# Patient Record
Sex: Male | Born: 1960
Health system: Southern US, Community
[De-identification: ages and names within clinical notes are randomized; demographics above are authoritative.]

## PROBLEM LIST (undated history)

## (undated) DIAGNOSIS — I4891 Unspecified atrial fibrillation: Secondary | ICD-10-CM

## (undated) DIAGNOSIS — I340 Nonrheumatic mitral (valve) insufficiency: Secondary | ICD-10-CM

## (undated) DIAGNOSIS — I471 Supraventricular tachycardia, unspecified: Secondary | ICD-10-CM

## (undated) DIAGNOSIS — Z8669 Personal history of other diseases of the nervous system and sense organs: Secondary | ICD-10-CM

## (undated) DIAGNOSIS — I480 Paroxysmal atrial fibrillation: Secondary | ICD-10-CM

## (undated) DIAGNOSIS — Z972 Presence of dental prosthetic device (complete) (partial): Secondary | ICD-10-CM

## (undated) DIAGNOSIS — N183 Chronic kidney disease, stage 3 unspecified: Secondary | ICD-10-CM

## (undated) DIAGNOSIS — I219 Acute myocardial infarction, unspecified: Secondary | ICD-10-CM

## (undated) DIAGNOSIS — R002 Palpitations: Secondary | ICD-10-CM

## (undated) DIAGNOSIS — E119 Type 2 diabetes mellitus without complications: Secondary | ICD-10-CM

## (undated) DIAGNOSIS — I509 Heart failure, unspecified: Secondary | ICD-10-CM

## (undated) DIAGNOSIS — I5042 Chronic combined systolic (congestive) and diastolic (congestive) heart failure: Secondary | ICD-10-CM

## (undated) DIAGNOSIS — I10 Essential (primary) hypertension: Secondary | ICD-10-CM

## (undated) DIAGNOSIS — G629 Polyneuropathy, unspecified: Secondary | ICD-10-CM

## (undated) DIAGNOSIS — E7800 Pure hypercholesterolemia, unspecified: Secondary | ICD-10-CM

## (undated) DIAGNOSIS — I2510 Atherosclerotic heart disease of native coronary artery without angina pectoris: Secondary | ICD-10-CM

## (undated) DIAGNOSIS — Z953 Presence of xenogenic heart valve: Secondary | ICD-10-CM

## (undated) DIAGNOSIS — G4730 Sleep apnea, unspecified: Secondary | ICD-10-CM

## (undated) HISTORY — DX: Chronic kidney disease, stage 3 unspecified: N18.30

## (undated) HISTORY — DX: Supraventricular tachycardia, unspecified: I47.10

## (undated) HISTORY — PX: CARDIAC VALVE REPLACEMENT: SHX585

## (undated) HISTORY — DX: Chronic combined systolic (congestive) and diastolic (congestive) heart failure: I50.42

## (undated) HISTORY — DX: Paroxysmal atrial fibrillation: I48.0

## (undated) HISTORY — DX: Nonrheumatic mitral (valve) insufficiency: I34.0

## (undated) HISTORY — PX: CORONARY ANGIOPLASTY: SHX604

## (undated) HISTORY — DX: Supraventricular tachycardia: I47.1

## (undated) HISTORY — PX: TONSILLECTOMY: SUR1361

## (undated) HISTORY — PX: CARDIAC CATHETERIZATION: SHX172

## (undated) HISTORY — PX: CORONARY ARTERY BYPASS GRAFT: SHX141

---

## 2008-12-27 ENCOUNTER — Emergency Department: Payer: Self-pay | Admitting: Emergency Medicine

## 2009-02-12 ENCOUNTER — Ambulatory Visit: Payer: Self-pay | Admitting: Orthopedic Surgery

## 2009-03-22 ENCOUNTER — Encounter: Payer: Self-pay | Admitting: Orthopedic Surgery

## 2009-04-03 ENCOUNTER — Encounter: Payer: Self-pay | Admitting: Orthopedic Surgery

## 2009-05-03 ENCOUNTER — Encounter: Payer: Self-pay | Admitting: Orthopedic Surgery

## 2015-04-23 ENCOUNTER — Encounter: Payer: Self-pay | Admitting: Emergency Medicine

## 2015-04-23 ENCOUNTER — Emergency Department
Admission: EM | Admit: 2015-04-23 | Discharge: 2015-04-23 | Disposition: A | Discharge status: Home / Self Care | Payer: BLUE CROSS/BLUE SHIELD | Attending: Emergency Medicine | Admitting: Emergency Medicine

## 2015-04-23 DIAGNOSIS — E860 Dehydration: Secondary | ICD-10-CM | POA: Diagnosis not present

## 2015-04-23 DIAGNOSIS — E1165 Type 2 diabetes mellitus with hyperglycemia: Secondary | ICD-10-CM | POA: Insufficient documentation

## 2015-04-23 DIAGNOSIS — Z9119 Patient's noncompliance with other medical treatment and regimen: Secondary | ICD-10-CM | POA: Insufficient documentation

## 2015-04-23 DIAGNOSIS — I471 Supraventricular tachycardia, unspecified: Secondary | ICD-10-CM

## 2015-04-23 DIAGNOSIS — R002 Palpitations: Secondary | ICD-10-CM | POA: Diagnosis present

## 2015-04-23 DIAGNOSIS — Z91199 Patient's noncompliance with other medical treatment and regimen due to unspecified reason: Secondary | ICD-10-CM

## 2015-04-23 DIAGNOSIS — R7309 Other abnormal glucose: Secondary | ICD-10-CM

## 2015-04-23 HISTORY — DX: Type 2 diabetes mellitus without complications: E11.9

## 2015-04-23 HISTORY — DX: Palpitations: R00.2

## 2015-04-23 LAB — BASIC METABOLIC PANEL
ANION GAP: 7 (ref 5–15)
BUN: 23 mg/dL — AB (ref 6–20)
CHLORIDE: 100 mmol/L — AB (ref 101–111)
CO2: 26 mmol/L (ref 22–32)
Calcium: 8.9 mg/dL (ref 8.9–10.3)
Creatinine, Ser: 1.23 mg/dL (ref 0.61–1.24)
GFR calc Af Amer: >60 mL/min (ref >60)
GLUCOSE: 423 mg/dL — AB (ref 65–99)
POTASSIUM: 5 mmol/L (ref 3.5–5.1)
Sodium: 133 mmol/L — ABNORMAL LOW (ref 135–145)

## 2015-04-23 LAB — CBC WITH DIFFERENTIAL/PLATELET
Basophils Absolute: 0.1 10*3/uL (ref 0–0.1)
Basophils Relative: 1 %
Eosinophils Absolute: 0.1 10*3/uL (ref 0–0.7)
Eosinophils Relative: 1 %
HEMATOCRIT: 45.9 % (ref 40.0–52.0)
HEMOGLOBIN: 15.5 g/dL (ref 13.0–18.0)
LYMPHS ABS: 1.9 10*3/uL (ref 1.0–3.6)
Lymphocytes Relative: 22 %
MCH: 29.3 pg (ref 26.0–34.0)
MCHC: 33.7 g/dL (ref 32.0–36.0)
MCV: 86.9 fL (ref 80.0–100.0)
MONOS PCT: 9 %
Monocytes Absolute: 0.8 10*3/uL (ref 0.2–1.0)
NEUTROS ABS: 5.8 10*3/uL (ref 1.4–6.5)
NEUTROS PCT: 67 %
Platelets: 226 10*3/uL (ref 150–440)
RBC: 5.28 MIL/uL (ref 4.40–5.90)
RDW: 13.1 % (ref 11.5–14.5)
WBC: 8.7 10*3/uL (ref 3.8–10.6)

## 2015-04-23 LAB — TSH: TSH: 2.162 u[IU]/mL (ref 0.350–4.500)

## 2015-04-23 LAB — MAGNESIUM: Magnesium: 1.9 mg/dL (ref 1.7–2.4)

## 2015-04-23 LAB — TROPONIN I

## 2015-04-23 MED ORDER — METFORMIN HCL 500 MG PO TABS
500.0000 mg | ORAL_TABLET | Freq: Two times a day (BID) | ORAL | Status: DC
Start: 2015-04-23 — End: 2018-01-24

## 2015-04-23 MED ORDER — SODIUM CHLORIDE 0.9 % IV BOLUS (SEPSIS)
1000.0000 mL | Freq: Once | INTRAVENOUS | Status: AC
  Administered 2015-04-23: 1000 mL via INTRAVENOUS

## 2015-04-23 MED ORDER — ADENOSINE 12 MG/4ML IV SOLN
INTRAVENOUS | Status: AC
  Filled 2015-04-23: qty 8

## 2015-04-23 MED ORDER — ADENOSINE 6 MG/2ML IV SOLN
6.0000 mg | Freq: Once | INTRAVENOUS | Status: AC
  Administered 2015-04-23: 6 mg via INTRAVENOUS

## 2015-04-23 MED ORDER — ADENOSINE 6 MG/2ML IV SOLN
INTRAVENOUS | Status: AC
  Filled 2015-04-23: qty 2

## 2015-04-23 MED ORDER — BLOOD GLUCOSE MONITOR KIT: PACK | Status: DC

## 2015-04-23 NOTE — ED Notes (Signed)
AAOx3.  Skin warm and dry. No SOB/ DOE.  D/C home. 

## 2015-04-23 NOTE — ED Notes (Signed)
Patient to ER for c/o palpitations. States he has h/o the same and has seen Dr. Nehemiah Massed in the past for full cardiac workup. States workup was negative, and had discussed starting medication for palpitations with Dr. Nehemiah Massed.

## 2015-04-23 NOTE — ED Notes (Signed)
Pt reports chest palpitations since 8am this morning; reports hx of the same. Pt reports nausea and dizziness.

## 2015-04-23 NOTE — ED Notes (Signed)
AAOx3.  Skin warm and dry.  No SOB/ DOE  Denies complaints of chest pain.

## 2015-04-23 NOTE — ED Notes (Signed)
CBG 230 

## 2015-04-23 NOTE — ED Provider Notes (Signed)
[facility] Emergency Department Provider Note  ____________________________________________  Time seen: Approximately 1:19 PM  I have reviewed the triage vital signs and the nursing notes.   HISTORY  Chief Complaint Palpitations    HPI Ricardo Brown is a 54 y.o. male with a history of recurrent SVT and diabetes mellitus and noncompliance. Patient states that he has SVT sometimes every day sometimes he can go 2 weeks without up and it is a very regular occurrence. Sometimes it lasts for a few hours sometimes it lasts for a few minutes. Usually he can make it go away by taking deep breaths and putting his head back. He has not had it last this long before. Started this morning at 8:00 in the morning. No chest pain or shortness of breath no nausea no vomiting. He has seen a cardiologist for this, he has worn a Holter for this. The patient has no complaints and was hoping would go away on its own but ultimately elected to come to the emergency department for further care. He has never needed adenosine. He does have a right bundle-branch block at baseline on an old EKG.  Past Medical History  Diagnosis Date  . Heart palpitations   . Diabetes mellitus without complication     There are no active problems to display for this patient.   History reviewed. No pertinent past surgical history.  No current outpatient prescriptions on file.  Allergies Review of patient's allergies indicates not on file.  No family history on file.  Social History Social History  Substance Use Topics  . Smoking status: Never Smoker   . Smokeless tobacco: None  . Alcohol Use: No    Review of Systems Constitutional: No fever/chills Eyes: No visual changes. ENT: No sore throat. Cardiovascular: Denies chest pain. Respiratory: Denies shortness of breath. Gastrointestinal: No abdominal pain.  No nausea, no vomiting.  No diarrhea.  No constipation. Genitourinary:  Negative for dysuria. Musculoskeletal: Negative for back pain. Skin: Negative for rash. Neurological: Negative for headaches, focal weakness or numbness.  10-point ROS otherwise negative.  ____________________________________________   PHYSICAL EXAM:  VITAL SIGNS: ED Triage Vitals  Enc Vitals Group     BP 04/23/15 1230 133/93 mmHg     Pulse Rate 04/23/15 1230 184     Resp 04/23/15 1230 20     Temp 04/23/15 1230 98 F (36.7 C)     Temp Source 04/23/15 1230 Oral     SpO2 04/23/15 1230 90 %     Weight 04/23/15 1230 207 lb (93.895 kg)     Height 04/23/15 1230 5\' 8"  (1.727 m)     Head Cir --      Peak Flow --      Pain Score 04/23/15 1229 2     Pain Loc --      Pain Edu? --      Excl. in [place]? --     Constitutional: Alert and oriented. Well appearing and in no acute distress. Eyes: Conjunctivae are normal. PERRL. EOMI. Head: Atraumatic. Nose: No congestion/rhinnorhea. Mouth/Throat: Mucous membranes are moist.  Oropharynx non-erythematous. Neck: No stridor.   Cardiovascular: Normal rate, regular rhythm. Grossly normal heart sounds.  Good peripheral circulation. Respiratory: Normal respiratory effort.  No retractions. Lungs CTAB. Gastrointestinal: Soft and nontender. No distention. No abdominal bruits. No CVA tenderness.  Musculoskeletal: No lower extremity tenderness nor edema.  No joint effusions. Neurologic:  Normal speech and language. No gross focal neurologic deficits are appreciated. No gait instability.  Skin:  Skin is warm, dry and intact. No rash noted. Psychiatric: Mood and affect are normal. Speech and behavior are normal.  ____________________________________________   LABS (all labs ordered are listed, but only abnormal results are displayed)  Labs Reviewed  CBC WITH DIFFERENTIAL/PLATELET  TROPONIN I  BASIC METABOLIC PANEL  TSH   ____________________________________________  EKG  ECG is reviewed by me, SVT with a right bundle-branch block noted,  configuration is similar to that which he normally has. Rate was 185.  Followed by this we have a rhythm strip also reviewed by me, which shows a rate of 170s with a break into sinus rhythm after adenosine, EKG after adenosine shows sinus tachycardia 118 with a right bundle branch block which is his baseline there is no ST elevation or depression, the patient's axis is normal. This was also reviewed by me ____________________________________________  RADIOLOGY I have reviewed x-rays  ____________________________________________   PROCEDURES  Procedure(s) performed: None  Critical Care performed: None  ____________________________________________   INITIAL IMPRESSION / ASSESSMENT AND PLAN / ED COURSE  Pertinent labs & imaging results that were available during my care of the patient were reviewed by me and considered in my medical decision making (see chart for details).  Patient with a long history of SVT presents with SVT. He broke after 6 of adenosine. They tried most rolled full different attempts at Valsalva maneuver with no success. Patient any chest pain or shortness of breath. given his age, we will check basic blood work to see if there is reasons lasted longer such as a low electrolyte for tsh however, it is certainly likely this is his chronic disease. patient does have a cardiologist and can follow-up. he has no tenderness had any chest pain or shortness of breath and do not think serial cardiac enzymes will be of any utility but I did do a troponin to see if there has been any stress leakage as this might give the cardiologist some information. Patient remains asystematic heart rate right now is in the 90s. Sinus.  ----------------------------------------- 2:13 PM on 04/23/2015 -----------------------------------------  Patient resting comfortably, heart rate in the 80s, no chest pain, troponin negative, TSH pending, patient's sugar is over 400. He is actually a  symptomatically with this. He is supposed to be taking medication but has not taken it for "long time". My suspicion is that the patient's baseline blood sugar is quite elevated. He does not check his blood sugar on a regular basis. He notes that he is neglecting this. I have spoken to him at length about the risks of untreated diabetes. I suspect he became dehydrated from hyperglycemia and this resulted in persistent SVT to which she is known to be prone. Patient does not appear to be otherwise ill. There is no evidence of infection or other reason for him to have elevated sugar. We will give him 2 L of fluid, and recheck to see if insulin as required. A symptomatically elevated blood sugar in a patient with noncompliant diabetes is not a life-threatening emergency. We will have her follow closely as an outpatient and I will restart his medications. ____________________________________________   FINAL CLINICAL IMPRESSION(S) / ED DIAGNOSES  Final diagnoses:  None     [name], MD 04/23/15 1415

## 2015-04-23 NOTE — ED Notes (Signed)
CBG- 281 

## 2015-04-24 LAB — GLUCOSE, CAPILLARY
GLUCOSE-CAPILLARY: 281 mg/dL — AB (ref 65–99)
Glucose-Capillary: 230 mg/dL — ABNORMAL HIGH (ref 65–99)

## 2016-01-30 DIAGNOSIS — I471 Supraventricular tachycardia: Secondary | ICD-10-CM | POA: Diagnosis not present

## 2016-01-30 DIAGNOSIS — E1165 Type 2 diabetes mellitus with hyperglycemia: Secondary | ICD-10-CM | POA: Diagnosis not present

## 2016-01-30 DIAGNOSIS — E785 Hyperlipidemia, unspecified: Secondary | ICD-10-CM | POA: Diagnosis not present

## 2016-01-30 DIAGNOSIS — E119 Type 2 diabetes mellitus without complications: Secondary | ICD-10-CM | POA: Diagnosis not present

## 2016-03-31 DIAGNOSIS — E1165 Type 2 diabetes mellitus with hyperglycemia: Secondary | ICD-10-CM | POA: Diagnosis not present

## 2016-03-31 DIAGNOSIS — R0602 Shortness of breath: Secondary | ICD-10-CM | POA: Diagnosis not present

## 2016-04-14 DIAGNOSIS — E1165 Type 2 diabetes mellitus with hyperglycemia: Secondary | ICD-10-CM | POA: Diagnosis not present

## 2016-04-14 DIAGNOSIS — I10 Essential (primary) hypertension: Secondary | ICD-10-CM | POA: Diagnosis not present

## 2016-04-14 DIAGNOSIS — E785 Hyperlipidemia, unspecified: Secondary | ICD-10-CM | POA: Diagnosis not present

## 2016-04-14 DIAGNOSIS — R0602 Shortness of breath: Secondary | ICD-10-CM | POA: Diagnosis not present

## 2016-04-21 DIAGNOSIS — I10 Essential (primary) hypertension: Secondary | ICD-10-CM | POA: Diagnosis not present

## 2016-04-21 DIAGNOSIS — I471 Supraventricular tachycardia: Secondary | ICD-10-CM | POA: Diagnosis not present

## 2016-04-21 DIAGNOSIS — E785 Hyperlipidemia, unspecified: Secondary | ICD-10-CM | POA: Diagnosis not present

## 2016-04-21 DIAGNOSIS — R0602 Shortness of breath: Secondary | ICD-10-CM | POA: Diagnosis not present

## 2016-04-21 DIAGNOSIS — E1165 Type 2 diabetes mellitus with hyperglycemia: Secondary | ICD-10-CM | POA: Diagnosis not present

## 2016-04-21 DIAGNOSIS — E119 Type 2 diabetes mellitus without complications: Secondary | ICD-10-CM | POA: Diagnosis not present

## 2016-08-06 DIAGNOSIS — I471 Supraventricular tachycardia: Secondary | ICD-10-CM | POA: Diagnosis not present

## 2016-08-06 DIAGNOSIS — E119 Type 2 diabetes mellitus without complications: Secondary | ICD-10-CM | POA: Diagnosis not present

## 2016-08-06 DIAGNOSIS — E1165 Type 2 diabetes mellitus with hyperglycemia: Secondary | ICD-10-CM | POA: Diagnosis not present

## 2016-08-06 DIAGNOSIS — E785 Hyperlipidemia, unspecified: Secondary | ICD-10-CM | POA: Diagnosis not present

## 2017-01-11 DIAGNOSIS — R002 Palpitations: Secondary | ICD-10-CM | POA: Diagnosis not present

## 2017-01-11 DIAGNOSIS — I10 Essential (primary) hypertension: Secondary | ICD-10-CM | POA: Diagnosis not present

## 2017-01-11 DIAGNOSIS — E1165 Type 2 diabetes mellitus with hyperglycemia: Secondary | ICD-10-CM | POA: Diagnosis not present

## 2017-01-11 DIAGNOSIS — E785 Hyperlipidemia, unspecified: Secondary | ICD-10-CM | POA: Diagnosis not present

## 2017-01-13 ENCOUNTER — Ambulatory Visit
Admission: RE | Admit: 2017-01-13 | Discharge: 2017-01-13 | Disposition: A | Discharge status: Home / Self Care | Payer: BLUE CROSS/BLUE SHIELD | Source: Ambulatory Visit | Attending: Internal Medicine | Admitting: Internal Medicine

## 2017-01-13 ENCOUNTER — Encounter: Payer: BLUE CROSS/BLUE SHIELD | Attending: Internal Medicine | Admitting: Internal Medicine

## 2017-01-13 ENCOUNTER — Other Ambulatory Visit: Payer: Self-pay | Admitting: Internal Medicine

## 2017-01-13 DIAGNOSIS — I10 Essential (primary) hypertension: Secondary | ICD-10-CM | POA: Insufficient documentation

## 2017-01-13 DIAGNOSIS — B999 Unspecified infectious disease: Secondary | ICD-10-CM | POA: Insufficient documentation

## 2017-01-13 DIAGNOSIS — Z7984 Long term (current) use of oral hypoglycemic drugs: Secondary | ICD-10-CM | POA: Insufficient documentation

## 2017-01-13 DIAGNOSIS — L97513 Non-pressure chronic ulcer of other part of right foot with necrosis of muscle: Secondary | ICD-10-CM | POA: Insufficient documentation

## 2017-01-13 DIAGNOSIS — E1142 Type 2 diabetes mellitus with diabetic polyneuropathy: Secondary | ICD-10-CM | POA: Insufficient documentation

## 2017-01-13 DIAGNOSIS — L97512 Non-pressure chronic ulcer of other part of right foot with fat layer exposed: Secondary | ICD-10-CM | POA: Diagnosis not present

## 2017-01-13 DIAGNOSIS — E11621 Type 2 diabetes mellitus with foot ulcer: Secondary | ICD-10-CM | POA: Insufficient documentation

## 2017-01-13 DIAGNOSIS — M7989 Other specified soft tissue disorders: Secondary | ICD-10-CM | POA: Diagnosis not present

## 2017-01-16 NOTE — Progress Notes (Signed)
RANDLE, SHATZER ([id number]) Visit Report for 01/13/2017 Chief Complaint Document Details [name] Date of Service: 01/13/2017 8:00 AM Patient Name: V. Patient Account Number: [id number] Medical Record Treating RN: Montey Hora [id number] Number: Other Clinician: Date of Birth/Sex: July 18, 1961 (56 y.o. Male) Treating Linton Ham Primary Care Provider: [name] Provider/Extender: G Referring Provider: Princess Perna in Treatment: 0 Information Obtained from: Patient Chief Complaint 01/13/17; patient is here for a review of a foot ulcer on the plantar aspect of his right foot in the setting of type 2 diabetes Electronic Signature(s) Signed: 01/13/2017 5:26:18 PM By: Linton Ham MD Entered By: Linton Ham on 01/13/2017 09:03:51 Corkery, [name] ([id number]) -------------------------------------------------------------------------------- Debridement Details [name] Date of Service: 01/13/2017 8:00 AM Patient Name: V. Patient Account Number: [id number] Medical Record Treating RN: Montey Hora [id number] Number: Other Clinician: Date of Birth/Sex: 1961/02/16 (56 y.o. Male) Treating Hiroto Saltzman, [place] Primary Care Provider: [name] Provider/Extender: G Referring Provider: Princess Perna in Treatment: 0 Debridement Performed for Wound #1 Right,Plantar Foot Assessment: Performed By: Physician [name], MD Debridement: Debridement Severity of Tissue Pre Fat layer exposed Debridement: Pre-procedure Verification/Time Out Yes - 08:41 Taken: Start Time: 08:41 Pain Control: Lidocaine 4% Topical Solution Level: Skin/Subcutaneous Tissue Total Area Debrided (L x 1.3 (cm) x 0.8 (cm) = 1.04 (cm) W): Tissue and other Viable, Non-Viable, Callus, Fibrin/Slough, Subcutaneous material debrided: Instrument: Blade, Forceps Bleeding: Moderate Hemostasis Achieved: Pressure End Time: 08:46 Procedural Pain: 0 Post  Procedural Pain: 0 Response to Treatment: Procedure was tolerated well Post Debridement Measurements of Total Wound Length: (cm) 1.7 Width: (cm) 1.2 Depth: (cm) 0.7 Volume: (cm) 1.122 Character of Wound/Ulcer Post Improved Debridement: Severity of Tissue Post Debridement: Fat layer exposed Post Procedure Diagnosis Same as Pre-procedure Electronic Signature(s) RAM, HAUGAN ([id number]) Signed: 01/13/2017 5:26:18 PM By: Linton Ham MD Signed: 01/14/2017 5:57:13 PM By: Montey Hora Entered By: Linton Ham on 01/13/2017 09:03:23 Demarais, [name] ([id number]) -------------------------------------------------------------------------------- HPI Details [name] Date of Service: 01/13/2017 8:00 AM Patient Name: V. Patient Account Number: [id number] Medical Record Treating RN: Montey Hora [id number] Number: Other Clinician: Date of Birth/Sex: 07-01-1961 (56 y.o. Male) Treating Linton Ham Primary Care Provider: [name] Provider/Extender: G Referring Provider: [name] Weeks in Treatment: 0 History of Present Illness HPI Description: 01/13/17; this is a 56 year old man who is type II diabetic. Over the last month he noted callus and wound development on the plantar aspect of his right foot with the wound center today roughly the second metatarsal head. He has been using Neosporin and a dry dressing. The patient relates some history of numbness in the foot. He does not have known vascular disease or claudication-type symptoms. He is a nonsmoker. He is unaware of his hemoglobin A1c but has an appointment with his primary physician. Primary physician mentioned toe diabetic shoes although he does not have them yet. He states that the shoes he wears to work were wearing down in the forefoot and he thinks he puts to much pressure on this area. He gives a history of an area on the plantar left foot about 6 months ago which sounds a lot like a  blister that responded well to offloading. A1c in this clinic is noncompressible bilaterally Electronic Signature(s) Signed: 01/13/2017 5:26:18 PM By: Linton Ham MD Entered By: Linton Ham on 01/13/2017 09:05:46 Clapham, [name] ([id number]) -------------------------------------------------------------------------------- Physical Exam Details [name] Date of Service: 01/13/2017 8:00 AM Patient Name: V. Patient Account Number: [id number] Medical Record Treating RN: Montey Hora [id number]  Number: Other Clinician: Date of Birth/Sex: 01-Feb-1961 (56 y.o. Male) Treating Mikalia Fessel Primary Care Provider: [name] Provider/Extender: G Referring Provider: [name] Weeks in Treatment: 0 Constitutional Patient is hypertensive.. Pulse regular and within target range for patient.[name] Respirations regular, non-labored and within target range.. Temperature is normal and within the target range for the patient.[name] appears in no distress. Eyes Conjunctivae clear. No discharge. Respiratory Respiratory effort is easy and symmetric bilaterally. Rate is normal at rest and on room air.. Cardiovascular Femoral arteries without bruits and pulses strong.. Pedal pulses palpable and strong bilaterally.. Lymphatic Nonpalpable the right popliteal or inguinal area. Musculoskeletal No overt involvement of the metatarsal phalangeal joints or interphalangeal joints of the right foot. Integumentary (Hair, Skin) There is no evidence of her rash or erythema around the wound. Neurological Reduced vibration sense and light touch in the plantar feet bilaterally. Psychiatric No evidence of depression, anxiety, or agitation. Calm, cooperative, and communicative. Appropriate interactions and affect.. Notes Wound exam; the area in question is on the plantar right foot at roughly the second metatarsal head. Small open area measuring 1.3 x 0.8 x 0.9. Some undermining which was removed  with pickups and a small scalpel removing skin, subcutaneous tissue and callus. There is no evidence of overt infection. No palpable tenderness. No cultures were felt to be necessary. The wound itself is surrounded by a large area of callus/thick scan indicative of excessive pressure when he walks on this area. Electronic Signature(s) Signed: 01/13/2017 5:26:18 PM By: Linton Ham MD Entered By: Linton Ham on 01/13/2017 09:11:29 Gellerman, [name] ([id number]) -------------------------------------------------------------------------------- Physician Orders Details [name] Date of Service: 01/13/2017 8:00 AM Patient Name: V. Patient Account Number: [id number] Medical Record Treating RN: Montey Hora [id number] Number: Other Clinician: Date of Birth/Sex: August 29, 1960 (55 y.o. Male) Treating [place] Lasseter, [place] Primary Care Provider: [name] Provider/Extender: G Referring Provider: Princess Perna in Treatment: 0 Verbal / Phone Orders: No Diagnosis Coding Wound Cleansing Wound #1 Right,Plantar Foot o Clean wound with Normal Saline. o May Shower, gently pat wound dry prior to applying new dressing. Anesthetic Wound #1 Right,Plantar Foot o Topical Lidocaine 4% cream applied to wound bed prior to debridement Primary Wound Dressing Wound #1 Right,Plantar Foot o Aquacel Ag Secondary Dressing Wound #1 Right,Plantar Foot o Gauze and Kerlix/Conform - secure lightly with coban Dressing Change Frequency Wound #1 Right,Plantar Foot o Change dressing every other day. Follow-up Appointments Wound #1 Right,Plantar Foot o Return Appointment in 1 week. Edema Control Wound #1 Right,Plantar Foot o Elevate legs to the level of the heart and pump ankles as often as possible Additional Orders / Instructions Wound #1 Right,Plantar Foot o Increase protein intake. o Other: - Please work to keep blood sugars below [place], [name]  ([id number]) Radiology o X-ray, foot - right Electronic Signature(s) Signed: 01/13/2017 5:26:18 PM By: Linton Ham MD Signed: 01/14/2017 5:57:13 PM By: Montey Hora Entered By: Montey Hora on 01/13/2017 08:54:34 Mccombs, [name] ([id number]) -------------------------------------------------------------------------------- Problem List Details [name] Date of Service: 01/13/2017 8:00 AM Patient Name: V. Patient Account Number: [id number] Medical Record Treating RN: Montey Hora [id number] Number: Other Clinician: Date of Birth/Sex: [birth year] (55 y.o. 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 Patient Account Number: [id number] Medical Record Treating RN: Montey Hora [id number] Number: Other Clinician: Date of Birth/Sex: 14-Oct-1960 (55 y.o. Male) Treating Linton Ham Primary Care Provider: [name] Provider/Extender: G Referring Provider: Princess Perna in Treatment: 0 Subjective Chief Complaint Information obtained from Patient 01/13/17; patient is here for a review of a foot ulcer on the plantar aspect of his right foot in the setting of type 2  diabetes History of Present Illness (HPI) 01/13/17; this is a 56 year old man who is type II diabetic. Over the last month he noted callus and wound development on the plantar aspect of his right foot with the wound center today roughly the second metatarsal head. He has been using Neosporin and a dry dressing. The patient relates some history of numbness in the foot. He does not have known vascular disease or claudication-type symptoms. He is a nonsmoker. He is unaware of his hemoglobin A1c but has an appointment with his primary physician. Primary physician mentioned toe diabetic shoes although he does not have them yet. He states that the shoes he wears to work were wearing down in the forefoot and he thinks he puts to much pressure on this area. He gives a history of an area on the plantar left foot about 6 months ago which sounds a lot like a blister that responded well to offloading. A1c in this clinic is noncompressible bilaterally Wound History Patient presents with 1 open wound that has been present for approximately 1 month. Patient has been treating wound in the following manner: neosporin. Laboratory tests have not been performed in the last month. Patient reportedly has not tested positive for an antibiotic resistant organism. Patient reportedly has not tested positive for osteomyelitis. Patient reportedly has not had testing performed to evaluate circulation in the legs. Patient History Information obtained from Patient. Allergies No Known Drug Allergies Social History Never smoker, Marital Status - Married, Alcohol Use - Rarely, Drug Use - No History, Caffeine Use - Daily. JAISHAUN, MCNAB ([id number]) Medical History Eyes Denies history of Cataracts, Glaucoma, Optic Neuritis Ear/Nose/Mouth/Throat Denies history of Chronic sinus problems/congestion, Middle ear problems Hematologic/Lymphatic Denies history of Anemia, Hemophilia, Human Immunodeficiency Virus,  Lymphedema, Sickle Cell Disease Respiratory Denies history of Aspiration, Asthma, Chronic Obstructive Pulmonary Disease (COPD), Pneumothorax, Sleep Apnea, Tuberculosis Cardiovascular Patient has history of Hypertension Denies history of Angina, Arrhythmia, Congestive Heart Failure, Coronary Artery Disease, Deep Vein Thrombosis, Hypotension, Myocardial Infarction, Peripheral Arterial Disease, Peripheral Venous Disease, Phlebitis, Vasculitis Gastrointestinal Denies history of Cirrhosis , Colitis, Crohn s, Hepatitis A, Hepatitis B, Hepatitis C Endocrine Patient has history of Type II Diabetes Immunological Denies history of Lupus Erythematosus, Raynaud s, Scleroderma Integumentary (Skin) Denies history of History of Burn, History of pressure wounds Musculoskeletal Denies history of Gout, Rheumatoid Arthritis, Osteoarthritis, Osteomyelitis Neurologic Patient has history of Neuropathy Denies history of Dementia, Quadriplegia, Paraplegia, Seizure Disorder Oncologic Denies history of Received Chemotherapy, Received Radiation Patient is treated with Oral Agents. Blood sugar is tested. Review of Systems (ROS) Constitutional Symptoms (General Health) The patient has no complaints or symptoms. Eyes The patient has no complaints or symptoms. Ear/Nose/Mouth/Throat The patient has no complaints or symptoms. Hematologic/Lymphatic The patient has no complaints or symptoms. Respiratory The patient has no complaints or  symptoms. Cardiovascular The patient has no complaints or symptoms. Gastrointestinal The patient has no complaints or symptoms. Genitourinary The patient has no complaints or symptoms. Immunological Marcou, CURLEE BOGAN ([id number]) The patient has no complaints or symptoms. Integumentary (Skin) The patient has no complaints or symptoms. Musculoskeletal The patient has no complaints or symptoms. Neurologic The patient has no complaints or symptoms. Oncologic The  patient has no complaints or symptoms. Psychiatric The patient has no complaints or symptoms. Objective Constitutional Patient is hypertensive.. Pulse regular and within target range for patient.[name] Respirations regular, non-labored and within target range.. Temperature is normal and within the target range for the patient.[name] appears in no distress. Vitals Time Taken: 8:15 AM, Height: 69 in, Source: Measured, Weight: 220 lbs, Source: Measured, BMI: 32.5, Temperature: 98.7 F, Pulse: 93 bpm, Respiratory Rate: 18 breaths/min, Blood Pressure: 187/83 mmHg. Eyes Conjunctivae clear. No discharge. Respiratory Respiratory effort is easy and symmetric bilaterally. Rate is normal at rest and on room air.. Cardiovascular Femoral arteries without bruits and pulses strong.. Pedal pulses palpable and strong bilaterally.. Lymphatic Nonpalpable the right popliteal or inguinal area. Musculoskeletal No overt involvement of the metatarsal phalangeal joints or interphalangeal joints of the right foot. Neurological Reduced vibration sense and light touch in the plantar feet bilaterally. Psychiatric No evidence of depression, anxiety, or agitation. Calm, cooperative, and communicative. Appropriate interactions and affect.[name] Brabender, [name] ([id number]) General Notes: Wound exam; the area in question is on the plantar right foot at roughly the second metatarsal head. Small open area measuring 1.3 x 0.8 x 0.9. Some undermining which was removed with pickups and a small scalpel removing skin, subcutaneous tissue and callus. There is no evidence of overt infection. No palpable tenderness. No cultures were felt to be necessary. The wound itself is surrounded by a large area of callus/thick scan indicative of excessive pressure when he walks on this area. Integumentary (Hair, Skin) There is no evidence of her rash or erythema around the wound. Wound #1 status is Open. Original cause of wound was Gradually  Appeared. The wound is located on the [place]. The wound measures 1.3cm length x 0.8cm width x 0.9cm depth; 0.817cm^2 area and 0.735cm^3 volume. There is no tunneling or undermining noted. There is a large amount of serous drainage noted. The wound margin is flat and intact. There is medium (34-66%) pink granulation within the wound bed. There is a medium (34-66%) amount of necrotic tissue within the wound bed including Adherent Slough. The periwound skin appearance exhibited: Callus. The periwound skin appearance did not exhibit: Crepitus, Excoriation, Induration, Rash, Scarring, Dry/Scaly, Maceration, Atrophie Blanche, Cyanosis, Ecchymosis, Hemosiderin Staining, Mottled, Pallor, Rubor, Erythema. Periwound temperature was noted as No Abnormality. Assessment Active Problems ICD-10 E11.621 - Type 2 diabetes mellitus with foot ulcer L97.513 - Non-pressure chronic ulcer of other part of right foot with necrosis of muscle E11.42 - Type 2 diabetes mellitus with diabetic polyneuropathy Procedures Wound #1 Pre-procedure diagnosis of Wound #1 is a Diabetic Wound/Ulcer of the Lower Extremity located on the Right,Plantar Foot .Severity of Tissue Pre Debridement is: Fat layer exposed. There was a Skin/Subcutaneous Tissue Debridement ([id number]) debridement with total area of 1.04 sq cm performed by [name], MD. with the following instrument(s): Blade and Forceps to remove Viable and Non-Viable tissue/material including Fibrin/Slough, Callus, and Subcutaneous after achieving pain control using Lidocaine 4% Topical Solution. A time out was conducted at 08:41, prior to the start of the procedure. A Moderate amount of bleeding was controlled with Pressure. The procedure  was tolerated well with a pain level of 0 throughout and a pain level of 0 following the procedure. Post Debridement Measurements: 1.7cm length x 1.2cm width x 0.7cm depth; 1.122cm^3 volume. STRUMMER, CANIPE  ([id number]) Character of Wound/Ulcer Post Debridement is improved. Severity of Tissue Post Debridement is: Fat layer exposed. Post procedure Diagnosis Wound #1: Same as Pre-Procedure Plan Wound Cleansing: Wound #1 Right,Plantar Foot: Clean wound with Normal Saline. May Shower, gently pat wound dry prior to applying new dressing. Anesthetic: Wound #1 Right,Plantar Foot: Topical Lidocaine 4% cream applied to wound bed prior to debridement Primary Wound Dressing: Wound #1 Right,Plantar Foot: Aquacel Ag Secondary Dressing: Wound #1 Right,Plantar Foot: Gauze and Kerlix/Conform - secure lightly with coban Dressing Change Frequency: Wound #1 Right,Plantar Foot: Change dressing every other day. Follow-up Appointments: Wound #1 Right,Plantar Foot: Return Appointment in 1 week. Edema Control: Wound #1 Right,Plantar Foot: Elevate legs to the level of the heart and pump ankles as often as possible Additional Orders / Instructions: Wound #1 Right,Plantar Foot: Increase protein intake. Other: - Please work to keep blood sugars below 180 Radiology ordered were: X-ray, foot - right #1 wagner's II diabetic foot ulcer on the plantar right foot second metatarsal head. #2 diabetic peripheral neuropathy Gonzalo, GRESHAM CAETANO ([id number]) #3 patient has noncompressible vessels but the rest of his arterial exam is normal. For now I will forego vascular studies #4 x-ray required of the right foot to look at the metatarsal heads #5 silver alginate as the primary dressing,gauze,kerlix change q2d #6 patient counseled about tight diabetes control to help with his diabetic neuropathy around other issues #7 we used a darco forefoot offloading boot however I told him that I felt this might need a total contact cast Electronic Signature(s) Signed: 01/13/2017 9:12:02 AM By: Linton Ham MD Entered By: Linton Ham on 01/13/2017 09:12:02 Matousek, [name]  ([id number]) -------------------------------------------------------------------------------- ROS/PFSH Details [name] Date of Service: 01/13/2017 8:00 AM Patient Name: V. Patient Account Number: [id number] Medical Record Treating RN: Montey Hora [id number] Number: Other Clinician: Date of Birth/Sex: [birth year] (56 y.o. Male) Treating Ania Levay, [place] Primary Care Provider: [name] Provider/Extender: G Referring Provider: [name] Weeks in Treatment: 0 Information Obtained From Patient Wound History Do you currently have one or more open woundso Yes How many open wounds do you currently haveo 1 Approximately how long have you had your woundso 1 month How have you been treating your wound(s) until nowo neosporin Has your wound(s) ever healed and then re-openedo No Have you had any lab work done in the past montho No Have you tested positive for an antibiotic resistant organism (MRSA, VRE)o No Have you tested positive for osteomyelitis (bone infection)o No Have you had any tests for circulation on your legso No Integumentary (Skin) Complaints and Symptoms: No Complaints or Symptoms Complaints and Symptoms: Negative for: Wounds; Bleeding or bruising tendency; Breakdown; Swelling Medical History: Negative for: History of Burn; History of pressure wounds Musculoskeletal Complaints and Symptoms: No Complaints or Symptoms Complaints and Symptoms: Negative for: Muscle Pain; Muscle Weakness Medical History: Negative for: Gout; Rheumatoid Arthritis; Osteoarthritis; Osteomyelitis Constitutional Symptoms (General Health) Complaints and Symptoms: No Complaints or Symptoms Corbit, [name] ([id number]) Eyes Complaints and Symptoms: No Complaints or Symptoms Medical History: Negative for: Cataracts; Glaucoma; Optic Neuritis Ear/Nose/Mouth/Throat Complaints and Symptoms: No Complaints or Symptoms Medical History: Negative for: Chronic sinus  problems/congestion; Middle ear problems Hematologic/Lymphatic Complaints and Symptoms: No Complaints or Symptoms Medical History: Negative for: Anemia; Hemophilia; Human Immunodeficiency Virus; Lymphedema; Sickle Cell Disease  Respiratory Complaints and Symptoms: No Complaints or Symptoms Medical History: Negative for: Aspiration; Asthma; Chronic Obstructive Pulmonary Disease (COPD); Pneumothorax; Sleep Apnea; Tuberculosis Cardiovascular Complaints and Symptoms: No Complaints or Symptoms Medical History: Positive for: Hypertension Negative for: Angina; Arrhythmia; Congestive Heart Failure; Coronary Artery Disease; Deep Vein Thrombosis; Hypotension; Myocardial Infarction; Peripheral Arterial Disease; Peripheral Venous Disease; Phlebitis; Vasculitis Gastrointestinal Complaints and Symptoms: No Complaints or Symptoms Medical History: Negative for: Cirrhosis ; Colitis; Crohnos; Hepatitis A; Hepatitis B; Hepatitis C Nalepa, MONTEL VANDERHOOF ([id number]) Endocrine Medical History: Positive for: Type II Diabetes Time with diabetes: 20+ years Treated with: Oral agents Blood sugar tested every day: Yes Tested : Genitourinary Complaints and Symptoms: No Complaints or Symptoms Immunological Complaints and Symptoms: No Complaints or Symptoms Medical History: Negative for: Lupus Erythematosus; Raynaudos; Scleroderma Neurologic Complaints and Symptoms: No Complaints or Symptoms Medical History: Positive for: Neuropathy Negative for: Dementia; Quadriplegia; Paraplegia; Seizure Disorder Oncologic Complaints and Symptoms: No Complaints or Symptoms Medical History: Negative for: Received Chemotherapy; Received Radiation Psychiatric Complaints and Symptoms: No Complaints or Symptoms Immunizations Pneumococcal Vaccine: Received Pneumococcal Vaccination: No Immunization Notes: up to date Family and Social History ELIASAR, HLAVATY ([id number]) Never smoker; Marital Status -  Married; Alcohol Use: Rarely; Drug Use: No History; Caffeine Use: Daily; Financial Concerns: No; Food, Clothing or Shelter Needs: No; Support System Lacking: No; Transportation Concerns: No; Advanced Directives: No; Patient does not want information on Advanced Directives Electronic Signature(s) Signed: 01/13/2017 5:26:18 PM By: Linton Ham MD Signed: 01/14/2017 5:57:13 PM By: Montey Hora Entered By: Montey Hora on 01/13/2017 08:16:52 Dwyer, [name] ([id number]) -------------------------------------------------------------------------------- SuperBill Details Riolo, [name] Date of Service: 01/13/2017 Patient Name: V. Patient Account Number: [id number] Medical Record Treating RN: Montey Hora [id number] Number: Other Clinician: Date of Birth/Sex: Aug 11, 1960 (56 y.o. Male) Treating Shakeera Rightmyer, [place] Primary Care Provider: [name] Provider/Extender: G Referring Provider: Princess Perna in Treatment: 0 Diagnosis Coding ICD-10 Codes Code Description E11.621 Type 2 diabetes mellitus with foot ulcer L97.513 Non-pressure chronic ulcer of other part of right foot with necrosis of muscle E11.42 Type 2 diabetes mellitus with diabetic polyneuropathy Facility Procedures CPT4: Description Modifier Quantity Code [id number] - WOUND CARE VISIT-LEV 3 EST PT 1 CPT4: [id number] - DEB SUBQ TISSUE 20 SQ CM/< 1 ICD-10 Description Diagnosis L97.513 Non-pressure chronic ulcer of other part of right foot with necrosis of muscle Physician Procedures CPT4: Description Modifier Quantity Code [id number] WC PHYS LEVEL 3 o NEW PT 25 1 ICD-10 Description Diagnosis E11.621 Type 2 diabetes mellitus with foot ulcer L97.513 Non-pressure chronic ulcer of other part of right foot with necrosis of muscle CPT4: 4492010 07121 - WC PHYS SUBQ TISS 20 SQ CM 1 ICD-10 Description Diagnosis L97.513 Non-pressure chronic ulcer of other part of right foot with necrosis of muscle TAITE, SCHOEPPNER  ([id number]) Electronic Signature(s) Signed: 01/13/2017 5:26:18 PM By: Linton Ham MD Entered By: Linton Ham on 01/13/2017 09:12:43

## 2017-01-16 NOTE — Progress Notes (Signed)
NICKO, DAHER ([id number]) Visit Report for 01/13/2017 Abuse/Suicide Risk Screen Details Ricardo Brown Height Date of Service: 01/13/2017 8:00 AM Patient Name: V. Patient Account Number: [id number] Medical Record Treating RN: Montey Hora [id number] Number: Other Clinician: Date of Birth/Sex: 06-Jun-1961 (56 y.o. Male) Treating ROBSON, [place] Primary Care Ezechiel St[place]: [name] Sari Cogan/Extender: G Referring Lucette Kratz: [name] Weeks in Treatment: 0 Abuse/Suicide Risk Screen Items Answer ABUSE/SUICIDE RISK SCREEN: Has anyone close to you tried to hurt or harm you recentlyo No Do you feel uncomfortable with anyone in your familyo No Has anyone forced you do things that you didnot want to doo No Do you have any thoughts of harming yourselfo No Patient displays signs or symptoms of abuse and/or neglect. No Electronic Signature(s) Signed: 01/14/2017 5:57:13 PM By: Montey Hora Entered By: Montey Hora on 01/13/2017 08:10:19 Verdone, [name] ([id number]) -------------------------------------------------------------------------------- Activities of Daily Living Details Ricardo Brown Height Date of Service: 01/13/2017 8:00 AM Patient Name: V. Patient Account Number: [id number] Medical Record Treating RN: Montey Hora [id number] Number: Other Clinician: Date of Birth/Sex: November 24, 1960 (56 y.o. Male) Treating ROBSON, Ricardo Brown Primary Care Cesia Orf: [name] Cordie Buening/Extender: G Referring Aerik Polan: Princess Perna in Treatment: 0 Activities of Daily Living Items Answer Activities of Daily Living (Please select one for each item) Drive Automobile Completely Able Take Medications Completely Able Use Telephone Completely [place] for Appearance Completely Able Use Toilet Completely Able Bath / Shower Completely Able Dress Self Completely Able Feed Self Completely Able Walk Completely Able Get In / Out Bed Completely Able Housework Completely Able Prepare  Meals Completely [place] for Self Completely Able Electronic Signature(s) Signed: 01/14/2017 5:57:13 PM By: Montey Hora Entered By: Montey Hora on 01/13/2017 08:10:36 Mars, [name] ([id number]) -------------------------------------------------------------------------------- Education Assessment Details Ricardo Brown Height Date of Service: 01/13/2017 8:00 AM Patient Name: V. Patient Account Number: [id number] Medical Record Treating RN: Montey Hora [id number] Number: Other Clinician: Date of Birth/Sex: [birth year] (55 y.o. Male) Treating ROBSON, Ricardo Brown Primary Care Dayelin Balducci: [name] Adalyna Godbee/Extender: G Referring Noheli Melder: Princess Perna in Treatment: 0 Primary Learner Assessed: Patient Learning Preferences/Education Level/Primary Language Learning Preference: Explanation, Demonstration Highest Education Level: High School Preferred Language: English Cognitive Barrier Assessment/Beliefs Language Barrier: No Translator Needed: No Memory Deficit: No Emotional Barrier: No Cultural/Religious Beliefs Affecting Medical No Care: Physical Barrier Assessment Impaired Vision: No Impaired Hearing: No Decreased Hand dexterity: No Knowledge/Comprehension Assessment Knowledge Level: Medium Comprehension Level: Medium Ability to understand written Medium instructions: Ability to understand verbal Medium instructions: Motivation Assessment Anxiety Level: Calm Cooperation: Cooperative Education Importance: Acknowledges Need Interest in Health Problems: Asks Questions Perception: Coherent Willingness to Engage in Self- Medium Management Activities: Readiness to Engage in Self- Medium Management Activities: LEAMAN, Ricardo Brown ([id number]) Electronic Signature(s) Signed: 01/14/2017 5:57:13 PM By: Montey Hora Entered By: Montey Hora on 01/13/2017 08:11:01 Siebenaler, [name]  ([id number]) -------------------------------------------------------------------------------- Fall Risk Assessment Details Ricardo Brown Height Date of Service: 01/13/2017 8:00 AM Patient Name: V. Patient Account Number: [id number] Medical Record Treating RN: Montey Hora [id number] Number: Other Clinician: Date of Birth/Sex: [birth year] (56 y.o. Male) Treating ROBSON, [place] Primary Care Isaac Dubie: [name] Damyah Gugel/Extender: G Referring Abdimalik Mayorquin: Princess Perna in Treatment: 0 Fall Risk Assessment Items Have you had 2 or more falls in the last 12 monthso 0 No Have you had any fall that resulted in injury in the last 12 monthso 0 No FALL RISK ASSESSMENT: History of falling - immediate or within 3 months 0 No Secondary diagnosis 0 No Ambulatory aid None/bed rest/wheelchair/nurse 0 Yes Crutches/cane/walker  0 No Furniture 0 No IV Access/Saline Lock 0 No Gait/Training Normal/bed rest/immobile 0 Yes Weak 0 No Impaired 0 No Mental Status Oriented to own ability 0 Yes Electronic Signature(s) Signed: 01/14/2017 5:57:13 PM By: Montey Hora Entered By: Montey Hora on 01/13/2017 08:11:45 Carrizales, [name] ([id number]) -------------------------------------------------------------------------------- Foot Assessment Details Ricardo Brown Height Date of Service: 01/13/2017 8:00 AM Patient Name: V. Patient Account Number: [id number] Medical Record Treating RN: Montey Hora [id number] Number: Other Clinician: Date of Birth/Sex: April 01, 1961 (55 y.o. Male) Treating ROBSON, [place] Primary Care Javaeh Muscatello: [name] Akaya Proffit/Extender: G Referring Karan Inclan: [name] Weeks in Treatment: 0 Foot Assessment Items Site Locations + = Sensation present, - = Sensation absent, C = Callus, U = Ulcer R = Redness, W = Warmth, M = Maceration, PU = Pre-ulcerative lesion F = Fissure, S = Swelling, D = Dryness Assessment Right: Left: Other Deformity: No No Prior Foot Ulcer: No  No Prior Amputation: No No Charcot Joint: No No Ambulatory Status: Ambulatory Without Help Gait: Steady Electronic Signature(s) Signed: 01/14/2017 5:57:13 PM By: Montey Hora Entered By: Montey Hora on 01/13/2017 08:19:34 Solem, [name] ([id number]) -------------------------------------------------------------------------------- Nutrition Risk Assessment Details Ricardo Brown Height Date of Service: 01/13/2017 8:00 AM Patient Name: V. Patient Account Number: [id number] Medical Record Treating RN: Montey Hora [id number] Number: Other Clinician: Date of Birth/Sex: Apr 24, 1961 (56 y.o. Male) Treating ROBSON, [place] Primary Care Amariyah Bazar: [name] Whit Bruni/Extender: G Referring Meliss Fleek: [name] Weeks in Treatment: 0 Height (in): Weight (lbs): Body Mass Index (BMI): Nutrition Risk Assessment Items NUTRITION RISK SCREEN: I have an illness or condition that made me change the kind and/or 0 No amount of food I eat I eat fewer than two meals per day 0 No I eat few fruits and vegetables, or milk products 0 No I have three or more drinks of beer, liquor or wine almost every day 0 No I have tooth or mouth problems that make it hard for me to eat 0 No I don't always have enough money to buy the food I need 0 No I eat alone most of the time 0 No I take three or more different prescribed or over-the-counter drugs a 1 Yes day Without wanting to, I have lost or gained 10 pounds in the last six 0 No months I am not always physically able to shop, cook and/or feed myself 0 No Nutrition Protocols Good Risk Protocol 0 No interventions needed Moderate Risk Protocol Electronic Signature(s) Signed: 01/14/2017 5:57:13 PM By: Montey Hora Entered By: Montey Hora on 01/13/2017 08:11:53

## 2017-01-16 NOTE — Progress Notes (Signed)
[name], [name] ([id number]) Visit Report for 01/13/2017 Allergy List Details Patient Name: Ricardo Brown, Ricardo Brown. Date of Service: 01/13/2017 8:00 AM Medical Record Number: [id number] Patient Account Number: [id number] Date of Birth/Sex: 1961-07-02 (56 y.o. Male) Treating RN: Montey Hora Primary Care Rox Mcgriff: [name] Other Clinician: Referring Remus Hagedorn: [name] Treating Sarha Bartelt/Extender: [name] Weeks in Treatment: 0 Allergies Active Allergies No Known Drug Allergies Allergy Notes Electronic Signature(s) Signed: 01/14/2017 5:57:13 PM By: Montey Hora Entered By: Montey Hora on 01/13/2017 08:10:09 Ricardo Brown, Ricardo Brown ([id number]) -------------------------------------------------------------------------------- Arrival Information Details Patient Name: Ricardo Brown Date of Service: 01/13/2017 8:00 AM Medical Record Number: [id number] Patient Account Number: [id number] Date of Birth/Sex: 1961-01-21 (56 y.o. Male) Treating RN: Montey Hora Primary Care Ahnaf Caponi: [name] Other Clinician: Referring Pallavi Clifton: [name] Treating Percilla Tweten/Extender: Tito Dine in Treatment: 0 Visit Information Patient Arrived: Ambulatory Arrival Time: 08:08 Accompanied By: dtr Transfer Assistance: None Patient Identification Verified: Yes Secondary Verification Process Yes Completed: Patient Has Alerts: Yes Patient Alerts: DMII ABI [place] BILATERAL >220 Electronic Signature(s) Signed: 01/14/2017 5:57:13 PM By: Montey Hora Entered By: Montey Hora on 01/13/2017 08:22:25 Ricardo Brown, Ricardo Brown ([id number]) -------------------------------------------------------------------------------- Clinic Level of Care Assessment Details Patient Name: Ricardo Brown Date of Service: 01/13/2017 8:00 AM Medical Record Number: [id number] Patient Account Number: [id number] Date of Birth/Sex: 03/31/1961 (56 y.o. Male) Treating RN: Montey Hora Primary Care Keyvon Herter: [name] Other Clinician: Referring Rayanne Padmanabhan: [name] Treating Andriel Omalley/Extender: [name] Weeks in Treatment: 0 Clinic Level of Care Assessment Items TOOL 1 Quantity Score []  - Use when EandM and Procedure is performed on INITIAL visit 0 ASSESSMENTS - Nursing Assessment / Reassessment X - General Physical Exam (combine w/ comprehensive assessment (listed just 1 20 below) when performed on new pt. evals) X - Comprehensive Assessment (HX, ROS, Risk Assessments, Wounds Hx, etc.) 1 25 ASSESSMENTS - Wound and Skin Assessment / Reassessment []  - Dermatologic / Skin Assessment (not related to wound area) 0 ASSESSMENTS - Ostomy and/or Continence Assessment and Care []  - Incontinence Assessment and Management 0 []  - Ostomy Care Assessment and Management (repouching, etc.) 0 PROCESS - Coordination of Care X - Simple Patient / Family Education for ongoing care 1 15 []  - Complex (extensive) Patient / Family Education for ongoing care 0 []  - Staff obtains Programmer, systems, Records, Test Results / Process Orders 0 []  - Staff telephones HHA, Nursing Homes / Clarify orders / etc 0 []  - Routine Transfer to another Facility (non-emergent condition) 0 []  - Routine Hospital Admission (non-emergent condition) 0 X - New Admissions / Biomedical engineer / Ordering NPWT, Apligraf, etc. 1 15 []  - Emergency Hospital Admission (emergent condition) 0 PROCESS - Special Needs []  - Pediatric / Minor Patient Management 0 []  - Isolation Patient Management 0 [place], Ricardo Brown ([id number]) []  - Hearing / Language / Visual special needs 0 []  - Assessment of Community assistance (transportation, D/C planning, etc.) 0 []  - Additional assistance / Altered mentation 0 []  - Support Surface(s) Assessment (bed, cushion, seat, etc.) 0 INTERVENTIONS - Miscellaneous []  - External ear exam 0 []  - Patient Transfer (multiple staff / Civil Service fast streamer / Similar devices) 0 []  - Simple  Staple / Suture removal (25 or less) 0 []  - Complex Staple / Suture removal (26 or more) 0 []  - Hypo/Hyperglycemic Management (do not check if billed separately) 0 X - Ankle / Brachial Index (ABI) - do not check if billed separately 1 15 Has the patient been seen at the hospital within the  last three years: Yes Total Score: 90 Level Of Care: New/Established - Level 3 Electronic Signature(s) Signed: 01/14/2017 5:57:13 PM By: Montey Hora Entered By: Montey Hora on 01/13/2017 08:42:24 Ricardo Brown, Ricardo Brown ([id number]) -------------------------------------------------------------------------------- Encounter Discharge Information Details Patient Name: Ricardo Brown Date of Service: 01/13/2017 8:00 AM Medical Record Number: [id number] Patient Account Number: [id number] Date of Birth/Sex: [birth year] (56 y.o. Male) Treating RN: Montey Hora Primary Care Lucrezia Dehne: [name] Other Clinician: Referring Kariann Wecker: [name] Treating Fredrich Cory/Extender: Tito Dine in Treatment: 0 Encounter Discharge Information Items Discharge Pain Level: 0 Discharge Condition: Stable Ambulatory Status: Ambulatory Discharge Destination: Home Transportation: Private Auto Accompanied By: dtr Schedule Follow-up Appointment: Yes Medication Reconciliation completed and provided to Patient/Care No Morley Gaumer: Provided on Clinical Summary of Care: 01/13/2017 Form Type Recipient Paper Patient WM Electronic Signature(s) Signed: 01/13/2017 9:07:28 AM By: Ruthine Dose Entered By: Ruthine Dose on 01/13/2017 09:07:28 Ricardo Brown, Ricardo Brown ([id number]) -------------------------------------------------------------------------------- Lower Extremity Assessment Details Patient Name: Ricardo Brown Date of Service: 01/13/2017 8:00 AM Medical Record Number: [id number] Patient Account Number: [id number] Date of Birth/Sex: 10-06-1960 (56 y.o. Male) Treating RN: Montey Hora Primary Care Cyle Kenyon: [name] Other Clinician: Referring Kaydence Menard: [name] Treating Amika Tassin/Extender: [name] Weeks in Treatment: 0 Edema Assessment Assessed: [Left: No] [Right: No] Edema: [Left: No] [Right: No] Vascular Assessment Pulses: Dorsalis Pedis Palpable: [Left:Yes] [Right:Yes] Doppler Audible: [Left:Yes] [Right:Yes] Posterior Tibial Palpable: [Left:Yes] [Right:Yes] Doppler Audible: [Left:Yes] [Right:Yes] Extremity colors, hair growth, and conditions: Extremity Color: [Left:Normal] [Right:Normal] Hair Growth on Extremity: [Left:Yes] [Right:Yes] Temperature of Extremity: [Left:Warm] [Right:Warm] Capillary Refill: [Left:< 3 seconds] [Right:< 3 seconds] Toe Nail Assessment Left: Right: Thick: Yes Yes Discolored: Yes Yes Deformed: Yes Yes Improper Length and Hygiene: Yes Yes Notes ABI [place] BILATERAL >220 Electronic Signature(s) Signed: 01/14/2017 5:57:13 PM By: Montey Hora Entered By: Montey Hora on 01/13/2017 08:21:29 Ricardo Brown, Ricardo Brown ([id number]) -------------------------------------------------------------------------------- Multi Wound Chart Details Patient Name: Ricardo Brown Date of Service: 01/13/2017 8:00 AM Medical Record Number: [id number] Patient Account Number: [id number] Date of Birth/Sex: 1960/10/17 (56 y.o. Male) Treating RN: Montey Hora Primary Care Sampson Self: [name] Other Clinician: Referring Marian Meneely: [name] Treating Jadelynn Boylan/Extender: [name] Weeks in Treatment: 0 Vital Signs Brown(in): 69 Pulse(bpm): 93 Weight(lbs): 220 Blood Pressure 187/83 (mmHg): Body Mass Index(BMI): 32 Temperature(F): 98.7 Respiratory Rate 18 (breaths/min): Photos: [N/A:N/A] Wound Location: Right Foot - Plantar N/A N/A Wounding Event: Gradually Appeared N/A N/A Primary Etiology: Diabetic Wound/Ulcer of N/A N/A the Lower Extremity Comorbid History: Hypertension, Type II N/A N/A Diabetes,  Neuropathy Date Acquired: 12/14/2016 N/A N/A Weeks of Treatment: 0 N/A N/A Wound Status: Open N/A N/A Measurements L x W x D 1.3x0.8x0.9 N/A N/A (cm) Area (cm) : 0.817 N/A N/A Volume (cm) : 0.735 N/A N/A % Reduction in Area: 0.00% N/A N/A % Reduction in Volume: 0.00% N/A N/A Classification: Grade 1 N/A N/A Exudate Amount: Large N/A N/A Exudate Type: Serous N/A N/A Exudate Color: amber N/A N/A Wound Margin: Flat and Intact N/A N/A Granulation Amount: Medium (34-66%) N/A N/A Granulation Quality: Pink N/A N/A Necrotic Amount: Medium (34-66%) N/A N/A Garris, Ricardo Brown ([id number]) Exposed Structures: Fascia: No N/A N/A Fat Layer (Subcutaneous Tissue) Exposed: No Tendon: No Muscle: No Joint: No Bone: No Epithelialization: None N/A N/A Debridement: Debridement ([id number]- N/A N/A 11047) Pre-procedure 08:41 N/A N/A Verification/Time Out Taken: Pain Control: Lidocaine 4% Topical N/A N/A Solution Tissue Debrided: Fibrin/Slough, Callus, N/A N/A Subcutaneous Level: Skin/Subcutaneous N/A N/A Tissue Debridement Area (sq 1.04 N/A N/A cm): Instrument: Blade,  Forceps N/A N/A Bleeding: Moderate N/A N/A Hemostasis Achieved: Pressure N/A N/A Procedural Pain: 0 N/A N/A Post Procedural Pain: 0 N/A N/A Debridement Treatment Procedure was tolerated N/A N/A Response: well Post Debridement 1.7x1.2x0.7 N/A N/A Measurements L x W x D (cm) Post Debridement 1.122 N/A N/A Volume: (cm) Periwound Skin Texture: Callus: Yes N/A N/A Excoriation: No Induration: No Crepitus: No Rash: No Scarring: No Periwound Skin Maceration: No N/A N/A Moisture: Dry/Scaly: No Periwound Skin Color: Atrophie Blanche: No N/A N/A Cyanosis: No Ecchymosis: No Erythema: No Hemosiderin Staining: No Mottled: No Pallor: No Rubor: No Temperature: No Abnormality N/A N/A Furgeson, Ricardo Brown ([id number]) Tenderness on No N/A N/A Palpation: Wound Preparation: Ulcer Cleansing: N/A N/A Rinsed/Irrigated  with Saline Topical Anesthetic Applied: Other: lidocaine 4% Procedures Performed: Debridement N/A N/A Treatment Notes Electronic Signature(s) Signed: 01/13/2017 5:26:18 PM By: Linton Ham MD Entered By: Linton Ham on 01/13/2017 09:03:11 Ricardo Brown, Ricardo Brown ([id number]) -------------------------------------------------------------------------------- [place] Details Patient Name: Ricardo Brown Date of Service: 01/13/2017 8:00 AM Medical Record Number: [id number] Patient Account Number: [id number] Date of Birth/Sex: [birth year] (56 y.o. Male) Treating RN: Montey Hora Primary Care Marinus Eicher: [name] Other Clinician: Referring Graiden Henes: [name] Treating Dontai Pember/Extender: Tito Dine in Treatment: 0 Active Inactive ` Orientation to the Wound Care Program Nursing Diagnoses: Knowledge deficit related to the wound healing center program Goals: Patient/caregiver will verbalize understanding of the [place] Program Date Initiated: 01/13/2017 Target Resolution Date: 03/26/2017 Goal Status: Active Interventions: Provide education on orientation to the wound center Notes: ` Peripheral Neuropathy Nursing Diagnoses: Knowledge deficit related to disease process and management of peripheral neurovascular dysfunction Goals: Patient/caregiver will verbalize understanding of disease process and disease management Date Initiated: 01/13/2017 Target Resolution Date: 03/26/2017 Goal Status: Active Interventions: Assess signs and symptoms of neuropathy upon admission and as needed Notes: ` Wound/Skin Impairment Nursing Diagnoses: Impaired tissue integrity Ricardo Brown, Ricardo Brown ([id number]) Goals: Patient/caregiver will verbalize understanding of skin care regimen Date Initiated: 01/13/2017 Target Resolution Date: 03/26/2017 Goal Status: Active Ulcer/skin breakdown will have a volume reduction of 30% by week 4 Date  Initiated: 01/13/2017 Target Resolution Date: 03/26/2017 Goal Status: Active Ulcer/skin breakdown will have a volume reduction of 50% by week 8 Date Initiated: 01/13/2017 Target Resolution Date: 03/26/2017 Goal Status: Active Ulcer/skin breakdown will have a volume reduction of 80% by week 12 Date Initiated: 01/13/2017 Target Resolution Date: 03/26/2017 Goal Status: Active Ulcer/skin breakdown will heal within 14 weeks Date Initiated: 01/13/2017 Target Resolution Date: 03/26/2017 Goal Status: Active Interventions: Assess patient/caregiver ability to obtain necessary supplies Assess patient/caregiver ability to perform ulcer/skin care regimen upon admission and as needed Assess ulceration(s) every visit Notes: Electronic Signature(s) Signed: 01/14/2017 5:57:13 PM By: Montey Hora Entered By: Montey Hora on 01/13/2017 08:36:12 Ricardo Brown, Ricardo Brown ([id number]) -------------------------------------------------------------------------------- Pain Assessment Details Patient Name: Ricardo Brown Date of Service: 01/13/2017 8:00 AM Medical Record Number: [id number] Patient Account Number: [id number] Date of Birth/Sex: [birth year] (56 y.o. Male) Treating RN: Montey Hora Primary Care Raiyah Speakman: [name] Other Clinician: Referring Cleven Jansma: [name] Treating Ahnika Hannibal/Extender: [name] Weeks in Treatment: 0 Active Problems Location of Pain Severity and Description of Pain Patient Has Paino No Site Locations Pain Management and Medication Current Pain Management: Notes Topical or injectable lidocaine is offered to patient for acute pain when surgical debridement is performed. If needed, Patient is instructed to use over the counter pain medication for the following 24-48 hours after debridement. Wound care MDs do not prescribed pain medications. Patient has  chronic pain or uncontrolled pain. Patient has been instructed to make an appointment with their Primary  Care Physician for pain management. Electronic Signature(s) Signed: 01/14/2017 5:57:13 PM By: Montey Hora Entered By: Montey Hora on 01/13/2017 08:09:45 Troop, Ricardo Brown ([id number]) -------------------------------------------------------------------------------- Patient/Caregiver Education Details Ricardo Brown Date of Service: 01/13/2017 8:00 AM Patient Name: V. Patient Account Number: [id number] Medical Record Treating RN: Montey Hora [id number] Number: Other Clinician: Date of Birth/Gender: 1961-06-06 (56 y.o. Male) Treating ROBSON, [place] Primary Care Physician: [name] Physician/Extender: G Referring Physician: Princess Perna in Treatment: 0 Education Assessment Education Provided To: Patient and Caregiver Education Topics Provided Wound/Skin Impairment: Handouts: Other: wound care as ordered Methods: Demonstration, Explain/Verbal Responses: State content correctly Electronic Signature(s) Signed: 01/14/2017 5:57:13 PM By: Montey Hora Entered By: Montey Hora on 01/13/2017 08:37:27 Clendenning, Ricardo Brown ([id number]) -------------------------------------------------------------------------------- Wound Assessment Details Patient Name: Ricardo Brown Date of Service: 01/13/2017 8:00 AM Medical Record Number: [id number] Patient Account Number: [id number] Date of Birth/Sex: 06-30-1961 (56 y.o. Male) Treating RN: Montey Hora Primary Care Jaylenne Hamelin: [name] Other Clinician: Referring Kairee Kozma: [name] Treating Iretta Mangrum/Extender: [name] Weeks in Treatment: 0 Wound Status Wound Number: 1 Primary Diabetic Wound/Ulcer of the Lower Etiology: Extremity Wound Location: Right Foot - Plantar Wound Status: Open Wounding Event: Gradually Appeared Comorbid Hypertension, Type II Diabetes, Date Acquired: 12/14/2016 History: Neuropathy Weeks Of Treatment: 0 Clustered Wound: No Photos Wound Measurements Length: (cm)  1.3 Width: (cm) 0.8 Depth: (cm) 0.9 Area: (cm) 0.817 Volume: (cm) 0.735 % Reduction in Area: 0% % Reduction in Volume: 0% Epithelialization: None Tunneling: No Undermining: No Wound Description Classification: Grade 1 Wound Margin: Flat and Intact Exudate Amount: Large Exudate Type: Serous Exudate Color: amber Foul Odor After Cleansing: No Slough/Fibrino Yes Wound Bed Granulation Amount: Medium (34-66%) Exposed Structure Granulation Quality: Pink Fascia Exposed: No Necrotic Amount: Medium (34-66%) Fat Layer (Subcutaneous Tissue) Exposed: No Necrotic Quality: Adherent Slough Tendon Exposed: No Muscle Exposed: No Kulik, Ricardo Brown ([id number]) Joint Exposed: No Bone Exposed: No Periwound Skin Texture Texture Color No Abnormalities Noted: No No Abnormalities Noted: No Callus: Yes Atrophie Blanche: No Crepitus: No Cyanosis: No Excoriation: No Ecchymosis: No Induration: No Erythema: No Rash: No Hemosiderin Staining: No Scarring: No Mottled: No Pallor: No Moisture Rubor: No No Abnormalities Noted: No Dry / Scaly: No Temperature / Pain Maceration: No Temperature: No Abnormality Wound Preparation Ulcer Cleansing: Rinsed/Irrigated with Saline Topical Anesthetic Applied: Other: lidocaine 4%, Treatment Notes Wound #1 (Right, Plantar Foot) 1. Cleansed with: Clean wound with Normal Saline 2. Anesthetic Topical Lidocaine 4% cream to wound bed prior to debridement 4. Dressing Applied: Aquacel Ag 5. Secondary Dressing Applied Gauze and Kerlix/Conform 7. Secured with Tape Other (specify in notes) Notes coban lightly Electronic Signature(s) Signed: 01/13/2017 8:28:24 AM By: Regan Lemming BSN, RN Signed: 01/14/2017 5:57:13 PM By: Montey Hora Entered By: Regan Lemming on 01/13/2017 65:46:50 Eidem, Ricardo Brown ([id number]) -------------------------------------------------------------------------------- Vitals Details Patient Name: Ricardo Brown Date of Service: 01/13/2017 8:00 AM Medical Record Number: [id number] Patient Account Number: [id number] Date of Birth/Sex: [birth year] (56 y.o. Male) Treating RN: Montey Hora Primary Care Nicola Quesnell: [name] Other Clinician: Referring Finnbar Cedillos: [name] Treating Boyce Keltner/Extender: [name] Weeks in Treatment: 0 Vital Signs Time Taken: 08:15 Temperature (F): 98.7 Brown (in): 69 Pulse (bpm): 93 Source: Measured Respiratory Rate (breaths/min): 18 Weight (lbs): 220 Blood Pressure (mmHg): 187/83 Source: Measured Reference Range: 80 - 120 mg / dl Body Mass Index (BMI): 32.5 Electronic Signature(s) Signed: 01/14/2017 5:57:13 PM By: Marjory Lies,  Di Kindle Entered By: Montey Hora on 01/13/2017 08:22:05

## 2017-01-18 DIAGNOSIS — E119 Type 2 diabetes mellitus without complications: Secondary | ICD-10-CM | POA: Diagnosis not present

## 2017-01-18 DIAGNOSIS — C61 Malignant neoplasm of prostate: Secondary | ICD-10-CM | POA: Diagnosis not present

## 2017-01-18 DIAGNOSIS — E785 Hyperlipidemia, unspecified: Secondary | ICD-10-CM | POA: Diagnosis not present

## 2017-01-18 DIAGNOSIS — E1165 Type 2 diabetes mellitus with hyperglycemia: Secondary | ICD-10-CM | POA: Diagnosis not present

## 2017-01-18 DIAGNOSIS — E784 Other hyperlipidemia: Secondary | ICD-10-CM | POA: Diagnosis not present

## 2017-01-18 DIAGNOSIS — I10 Essential (primary) hypertension: Secondary | ICD-10-CM | POA: Diagnosis not present

## 2017-01-18 DIAGNOSIS — I471 Supraventricular tachycardia: Secondary | ICD-10-CM | POA: Diagnosis not present

## 2017-01-20 ENCOUNTER — Encounter: Payer: BLUE CROSS/BLUE SHIELD | Admitting: Internal Medicine

## 2017-01-20 DIAGNOSIS — E11621 Type 2 diabetes mellitus with foot ulcer: Secondary | ICD-10-CM | POA: Diagnosis not present

## 2017-01-20 DIAGNOSIS — Z7984 Long term (current) use of oral hypoglycemic drugs: Secondary | ICD-10-CM | POA: Diagnosis not present

## 2017-01-20 DIAGNOSIS — L97513 Non-pressure chronic ulcer of other part of right foot with necrosis of muscle: Secondary | ICD-10-CM | POA: Diagnosis not present

## 2017-01-20 DIAGNOSIS — L97512 Non-pressure chronic ulcer of other part of right foot with fat layer exposed: Secondary | ICD-10-CM | POA: Diagnosis not present

## 2017-01-20 DIAGNOSIS — E1142 Type 2 diabetes mellitus with diabetic polyneuropathy: Secondary | ICD-10-CM | POA: Diagnosis not present

## 2017-01-20 DIAGNOSIS — I10 Essential (primary) hypertension: Secondary | ICD-10-CM | POA: Diagnosis not present

## 2017-01-22 NOTE — Progress Notes (Signed)
JABRON, WEESE ([id number]) Visit Report for 01/20/2017 Arrival Information Details Patient Name: Ricardo Brown, Ricardo Brown. Date of Service: 01/20/2017 9:15 AM Medical Record Number: [id number] Patient Account Number: [id number] Date of Birth/Sex: 1961-05-31 (56 y.o. Male) Treating RN: Montey Hora Primary Care Jilda Kress: [name] Other Clinician: Referring Mikayla Chiusano: [name] Treating Lilie Vezina/Extender: Tito Dine in Treatment: 1 Visit Information History Since Last Visit Added or deleted any medications: No Patient Arrived: Ambulatory Any new allergies or adverse reactions: No Arrival Time: 09:32 Had a fall or experienced change in No Accompanied By: dtr activities of daily living that may affect Transfer Assistance: None risk of falls: Patient Identification Verified: Yes Signs or symptoms of abuse/neglect since last No Secondary Verification Yes visito Process Completed: Hospitalized since last visit: No Patient Has Alerts: Yes Has Dressing in Place as Prescribed: Yes Patient Alerts: DMII Pain Present Now: No ABI [place] BILATERAL >220 Electronic Signature(s) Signed: 01/20/2017 5:44:09 PM By: Montey Hora Entered By: Montey Hora on 01/20/2017 09:33:01 Halter, [name] ([id number]) -------------------------------------------------------------------------------- Encounter Discharge Information Details Patient Name: Ricardo Brown Date of Service: 01/20/2017 9:15 AM Medical Record Number: [id number] Patient Account Number: [id number] Date of Birth/Sex: [birth year] (56 y.o. Male) Treating RN: Montey Hora Primary Care Odester Nilson: [name] Other Clinician: Referring Tyjon Bowen: [name] Treating Israel Werts/Extender: Tito Dine in Treatment: 1 Encounter Discharge Information Items Discharge Pain Level: 0 Discharge Condition: Stable Ambulatory Status: Ambulatory Discharge Destination: Home Transportation: Private  Auto Accompanied By: dtr Schedule Follow-up Appointment: Yes Medication Reconciliation completed and provided to Patient/Care No Kayia Billinger: Provided on Clinical Summary of Care: 01/20/2017 Form Type Recipient Paper Patient WM Electronic Signature(s) Signed: 01/20/2017 10:09:35 AM By: Ruthine Dose Entered By: Ruthine Dose on 01/20/2017 10:09:35 Knobloch, [name] ([id number]) -------------------------------------------------------------------------------- Lower Extremity Assessment Details Patient Name: Ricardo Brown Date of Service: 01/20/2017 9:15 AM Medical Record Number: [id number] Patient Account Number: [id number] Date of Birth/Sex: [birth year] (56 y.o. Male) Treating RN: Montey Hora Primary Care Zacari Radick: [name] Other Clinician: Referring Alaric Gladwin: [name] Treating Terrance Lanahan/Extender: [name] Weeks in Treatment: 1 Vascular Assessment Pulses: Dorsalis Pedis Palpable: [Right:Yes] Posterior Tibial Extremity colors, hair growth, and conditions: Extremity Color: [Right:Normal] Hair Growth on Extremity: [Right:Yes] Temperature of Extremity: [Right:Warm] Capillary Refill: [Right:< 3 seconds] Electronic Signature(s) Signed: 01/20/2017 5:44:09 PM By: Montey Hora Entered By: Montey Hora on 01/20/2017 09:54:40 Andujo, [name] ([id number]) -------------------------------------------------------------------------------- Multi Wound Chart Details Patient Name: Ricardo Brown Date of Service: 01/20/2017 9:15 AM Medical Record Number: [id number] Patient Account Number: [id number] Date of Birth/Sex: 1960-08-30 (56 y.o. Male) Treating RN: Montey Hora Primary Care Harveer Sadler: [name] Other Clinician: Referring Saher Davee: [name] Treating Kylen Schliep/Extender: [name] Weeks in Treatment: 1 Vital Signs Height(in): 69 Pulse(bpm): 84 Weight(lbs): 220 Blood Pressure 163/81 (mmHg): Body Mass Index(BMI):  32 Temperature(F): 98.4 Respiratory Rate 18 (breaths/min): Photos: [1:No Photos] [N/A:N/A] Wound Location: [1:Right Foot - Plantar] [N/A:N/A] Wounding Event: [1:Gradually Appeared] [N/A:N/A] Primary Etiology: [1:Diabetic Wound/Ulcer of the Lower Extremity] [N/A:N/A] Comorbid History: [1:Hypertension, Type II Diabetes, Neuropathy] [N/A:N/A] Date Acquired: [1:12/14/2016] [N/A:N/A] Weeks of Treatment: [1:1] [N/A:N/A] Wound Status: [1:Open] [N/A:N/A] Measurements L x W x D 0.8x0.5x0.6 [N/A:N/A] (cm) Area (cm) : [1:0.314] [N/A:N/A] Volume (cm) : [1:0.188] [N/A:N/A] % Reduction in Area: [1:61.60%] [N/A:N/A] % Reduction in Volume: 74.40% [N/A:N/A] Classification: [1:Grade 1] [N/A:N/A] Exudate Amount: [1:Large] [N/A:N/A] Exudate Type: [1:Serous] [N/A:N/A] Exudate Color: [1:amber] [N/A:N/A] Wound Margin: [1:Flat and Intact] [N/A:N/A] Granulation Amount: [1:Medium (34-66%)] [N/A:N/A] Granulation Quality: [1:Pink] [N/A:N/A] Necrotic Amount: [1:Medium (34-66%)] [N/A:N/A] Exposed Structures: [  1:Fascia: No Fat Layer (Subcutaneous Tissue) Exposed: No Tendon: No Muscle: No] [N/A:N/A] Joint: No Bone: No Epithelialization: None N/A N/A Debridement: Debridement ([id number]- N/A N/A 11047) Pre-procedure 09:55 N/A N/A Verification/Time Out Taken: Pain Control: Lidocaine 4% Topical N/A N/A Solution Tissue Debrided: Fibrin/Slough, Callus, N/A N/A Subcutaneous Level: Skin/Subcutaneous N/A N/A Tissue Debridement Area (sq 0.4 N/A N/A cm): Instrument: Curette N/A N/A Bleeding: Minimum N/A N/A Hemostasis Achieved: Pressure N/A N/A Procedural Pain: 0 N/A N/A Post Procedural Pain: 0 N/A N/A Debridement Treatment Procedure was tolerated N/A N/A Response: well Post Debridement 0.8x0.5x0.7 N/A N/A Measurements L x W x D (cm) Post Debridement 0.22 N/A N/A Volume: (cm) Periwound Skin Texture: Callus: Yes N/A N/A Excoriation: No Induration: No Crepitus: No Rash: No Scarring: No Periwound  Skin Maceration: No N/A N/A Moisture: Dry/Scaly: No Periwound Skin Color: Atrophie Blanche: No N/A N/A Cyanosis: No Ecchymosis: No Erythema: No Hemosiderin Staining: No Mottled: No Pallor: No Rubor: No Temperature: No Abnormality N/A N/A Tenderness on No N/A N/A Palpation: Wound Preparation: Ulcer Cleansing: N/A N/A Rinsed/Irrigated with Saline Kurtz, [name] ([id number]) Topical Anesthetic Applied: Other: lidocaine 4% Procedures Performed: Debridement N/A N/A Treatment Notes Electronic Signature(s) Signed: 01/20/2017 5:43:18 PM By: Linton Ham MD Entered By: Linton Ham on 01/20/2017 10:00:51 Delahoz, [name] ([id number]) -------------------------------------------------------------------------------- E[place]ace Plan Details Patient Name: Ricardo Brown Date of Service: 01/20/2017 9:15 AM Medical Record Number: [id number] Patient Account Number: [id number] Date of Birth/Sex: [birth year] (56 y.o. Male) Treating RN: Montey Hora Primary Care Brya Simerly: [name] Other Clinician: Referring Zayah Keilman: [name] Treating Everette Dimauro/Extender: Tito Dine in Treatment: 1 Active Inactive ` Orientation to the Wound Care Program Nursing Diagnoses: Knowledge deficit related to the wound healing center program Goals: Patient/caregiver will verbalize understanding of the [facility] Program Date Initiated: 01/13/2017 Target Resolution Date: 03/26/2017 Goal Status: Active Interventions: Provide education on orientation to the wound center Notes: ` Peripheral Neuropathy Nursing Diagnoses: Knowledge deficit related to disease process and management of peripheral neurovascular dysfunction Goals: Patient/caregiver will verbalize understanding of disease process and disease management Date Initiated: 01/13/2017 Target Resolution Date: 03/26/2017 Goal Status: Active Interventions: Assess signs and symptoms of  neuropathy upon admission and as needed Notes: ` Wound/Skin Impairment Nursing Diagnoses: Impaired tissue integrity ZACKORY, PUDLO ([id number]) Goals: Patient/caregiver will verbalize understanding of skin care regimen Date Initiated: 01/13/2017 Target Resolution Date: 03/26/2017 Goal Status: Active Ulcer/skin breakdown will have a volume reduction of 30% by week 4 Date Initiated: 01/13/2017 Target Resolution Date: 03/26/2017 Goal Status: Active Ulcer/skin breakdown will have a volume reduction of 50% by week 8 Date Initiated: 01/13/2017 Target Resolution Date: 03/26/2017 Goal Status: Active Ulcer/skin breakdown will have a volume reduction of 80% by week 12 Date Initiated: 01/13/2017 Target Resolution Date: 03/26/2017 Goal Status: Active Ulcer/skin breakdown will heal within 14 weeks Date Initiated: 01/13/2017 Target Resolution Date: 03/26/2017 Goal Status: Active Interventions: Assess patient/caregiver ability to obtain necessary supplies Assess patient/caregiver ability to perform ulcer/skin care regimen upon admission and as needed Assess ulceration(s) every visit Notes: Electronic Signature(s) Signed: 01/20/2017 5:44:09 PM By: Montey Hora Entered By: Montey Hora on 01/20/2017 09:54:46 Formanek, [name] ([id number]) -------------------------------------------------------------------------------- Pain Assessment Details Patient Name: Ricardo Brown Date of Service: 01/20/2017 9:15 AM Medical Record Number: [id number] Patient Account Number: [id number] Date of Birth/Sex: [birth year] (56 y.o. Male) Treating RN: Montey Hora Primary Care Kyair Ditommaso: [name] Other Clinician: Referring Kyrstal Monterrosa: [name] Treating Banessa Mao/Extender: [name] Weeks in Treatment: 1 Active Problems Location of Pain Severity and  Description of Pain Patient Has Paino No Site Locations Pain Management and Medication Current Pain Management: Notes Topical or  injectable lidocaine is offered to patient for acute pain when surgical debridement is performed. If needed, Patient is instructed to use over the counter pain medication for the following 24-48 hours after debridement. Wound care MDs do not prescribed pain medications. Patient has chronic pain or uncontrolled pain. Patient has been instructed to make an appointment with their Primary Care Physician for pain management. Electronic Signature(s) Signed: 01/20/2017 5:44:09 PM By: Montey Hora Entered By: Montey Hora on 01/20/2017 09:36:17 Friedt, [name] ([id number]) -------------------------------------------------------------------------------- Patient/Caregiver Education Details [name] Date of Service: 01/20/2017 9:15 AM Patient Name: V. Patient Account Number: [id number] Medical Record Treating RN: Montey Hora [id number] Number: Other Clinician: Date of Birth/Gender: Jul 09, 1961 (56 y.o. Male) Treating ROBSON, Allyn Primary Care Physician: [name] Physician/Extender: G Referring Physician: Princess Perna in Treatment: 1 Education Assessment Education Provided To: Patient and Caregiver Education Topics Provided Wound/Skin Impairment: Handouts: Other: wound care as ordered Methods: Demonstration, Explain/Verbal Responses: State content correctly Electronic Signature(s) Signed: 01/20/2017 5:44:09 PM By: Montey Hora Entered By: Montey Hora on 01/20/2017 09:55:39 Wilkie, [name] ([id number]) -------------------------------------------------------------------------------- Wound Assessment Details Patient Name: Ricardo Brown Date of Service: 01/20/2017 9:15 AM Medical Record Number: [id number] Patient Account Number: [id number] Date of Birth/Sex: [birth year] (56 y.o. Male) Treating RN: Montey Hora Primary Care Cameren Earnest: [name] Other Clinician: Referring Antanisha Mohs: [name] Treating Berit Raczkowski/Extender: [name] Weeks in Treatment: 1 Wound Status Wound Number: 1 Primary Diabetic Wound/Ulcer of the Lower Etiology: Extremity Wound Location: Right Foot - Plantar Wound Status: Open Wounding Event: Gradually Appeared Comorbid Hypertension, Type II Diabetes, Date Acquired: 12/14/2016 History: Neuropathy Weeks Of Treatment: 1 Clustered Wound: No Photos Photo Uploaded By: Montey Hora on 01/20/2017 15:08:36 Wound Measurements Length: (cm) 0.8 Width: (cm) 0.5 Depth: (cm) 0.6 Area: (cm) 0.314 Volume: (cm) 0.188 % Reduction in Area: 61.6% % Reduction in Volume: 74.4% Epithelialization: None Tunneling: No Undermining: No Wound Description Classification: Grade 1 Wound Margin: Flat and Intact Exudate Amount: Large Exudate Type: Serous Exudate Color: amber Foul Odor After Cleansing: No Slough/Fibrino Yes Wound Bed Granulation Amount: Medium (34-66%) Exposed Structure Granulation Quality: Pink Fascia Exposed: No Necrotic Amount: Medium (34-66%) Fat Layer (Subcutaneous Tissue) Exposed: No Necrotic Quality: Adherent Slough Tendon Exposed: No Stines, [name] ([id number]) Muscle Exposed: No Joint Exposed: No Bone Exposed: No Periwound Skin Texture Texture Color No Abnormalities Noted: No No Abnormalities Noted: No Callus: Yes Atrophie Blanche: No Crepitus: No Cyanosis: No Excoriation: No Ecchymosis: No Induration: No Erythema: No Rash: No Hemosiderin Staining: No Scarring: No Mottled: No Pallor: No Moisture Rubor: No No Abnormalities Noted: No Dry / Scaly: No Temperature / Pain Maceration: No Temperature: No Abnormality Wound Preparation Ulcer Cleansing: Rinsed/Irrigated with Saline Topical Anesthetic Applied: Other: lidocaine 4%, Treatment Notes Wound #1 (Right, Plantar Foot) 1. Cleansed with: Clean wound with Normal Saline 2. Anesthetic Topical Lidocaine 4% cream to wound bed prior to debridement 4. Dressing Applied: Aquacel Ag 5. Secondary Dressing  Applied Gauze and Kerlix/Conform 7. Secured with Tape Other (specify in notes) Notes coban lightly Electronic Signature(s) Signed: 01/20/2017 5:44:09 PM By: Montey Hora Entered By: Montey Hora on 01/20/2017 09:41:49 Patriarca, [name] ([id number]) -------------------------------------------------------------------------------- [place] Details Patient Name: Ricardo Brown Date of Service: 01/20/2017 9:15 AM Medical Record Number: [id number] Patient Account Number: [id number] Date of Birth/Sex: 04-28-1961 (56 y.o. Male) Treating RN: Montey Hora Primary Care Tiffanie Blassingame: [name] Other Clinician: Referring Liesel Peckenpaugh:  MASOUD, JAVED Treating Marvel Mcphillips/Extender: Linton Ham G Weeks in Treatment: 1 Vital Signs Time Taken: 09:36 Temperature (F): 98.4 Height (in): 69 Pulse (bpm): 84 Weight (lbs): 220 Respiratory Rate (breaths/min): 18 Body Mass Index (BMI): 32.5 Blood Pressure (mmHg): 163/81 Reference Range: 80 - 120 mg / dl Electronic Signature(s) Signed: 01/20/2017 5:44:09 PM By: Montey Hora Entered By: Montey Hora on 01/20/2017 09:36:33

## 2017-01-22 NOTE — Progress Notes (Signed)
MARSEAN, ELKHATIB ([id number]) Visit Report for 01/20/2017 Debridement Details Albertsen, Ricardo Brown Date of Service: 01/20/2017 9:15 AM Patient Name: Ricardo Brown. Patient Account Number: [id number] Medical Record Treating RN: Montey Hora [id number] Number: Other Clinician: Date of Birth/Sex: [birth year] (56 y.o. Male) Treating Gennavieve Huq, [place] Primary Care Provider: [name] Provider/Extender: G Referring Provider: Princess Perna in Treatment: 1 Debridement Performed for Wound #1 Right,Plantar Foot Assessment: Performed By: Physician [name], MD Debridement: Debridement Severity of Tissue Pre Fat layer exposed Debridement: Pre-procedure Verification/Time Out Yes - 09:55 Taken: Start Time: 09:55 Pain Control: Lidocaine 4% Topical Solution Level: Skin/Subcutaneous Tissue Total Area Debrided (L x 0.8 (cm) x 0.5 (cm) = 0.4 (cm) W): Tissue and other Viable, Non-Viable, Callus, Fibrin/Slough, Subcutaneous material debrided: Instrument: Curette Bleeding: Minimum Hemostasis Achieved: Pressure End Time: 09:58 Procedural Pain: 0 Post Procedural Pain: 0 Response to Treatment: Procedure was tolerated well Post Debridement Measurements of Total Wound Length: (cm) 0.8 Width: (cm) 0.5 Depth: (cm) 0.7 Volume: (cm) 0.22 Character of Wound/Ulcer Post Improved Debridement: Severity of Tissue Post Debridement: Fat layer exposed Post Procedure Diagnosis Same as Pre-procedure Ricardo, Brown ([id number]) Electronic Signature(s) Signed: 01/20/2017 5:43:18 PM By: Linton Ham MD Signed: 01/20/2017 5:44:09 PM By: Montey Hora Entered By: Montey Hora on 01/20/2017 09:58:14 [name], Ricardo Brown ([id number]) -------------------------------------------------------------------------------- HPI Details Ricardo Brown Date of Service: 01/20/2017 9:15 AM Patient Name: Ricardo Brown. Patient Account Number: [id number] Medical Record Treating RN: Montey Hora [id number] Number: Other Clinician: Date of Birth/Sex: Sep 19, 1960 (56 y.o. Male) Treating Linton Ham Primary Care Provider: [name] Provider/Extender: G Referring Provider: [name] Weeks in Treatment: 1 History of Present Illness HPI Description: 01/13/17; this is a 56 year old man who is type II diabetic. Over the last month he noted callus and wound development on the plantar aspect of his right foot with the wound center today roughly the second metatarsal head. He has been using Neosporin and a dry dressing. The patient relates some history of numbness in the foot. He does not have known vascular disease or claudication-type symptoms. He is a nonsmoker. He is unaware of his hemoglobin A1c but has an appointment with his primary physician. Primary physician mentioned toe diabetic shoes although he does not have them yet. He states that the shoes he wears to work were wearing down in the forefoot and he thinks he puts to much pressure on this area. He gives a history of an area on the plantar left foot about 6 months ago which sounds a lot like a blister that responded well to offloading. A1c in this clinic is noncompressible bilaterally 01/20/17; patient arrives today with an improved-looking wound. Measurements at 5 x 0.5 x 0.6 units surface of this appears to be healthy. Rolled edges of skin and subcutaneous tissue needed debridement however in general everything looks better. Plain x-ray of the foot was a bit indeterminant but did not show obvious bone destruction. As the patient is improving I'll forego an MRI for the moment Electronic Signature(s) Signed: 01/20/2017 5:43:18 PM By: Linton Ham MD Entered By: Linton Ham on 01/20/2017 10:02:40 Douthit, Ricardo Brown ([id number]) -------------------------------------------------------------------------------- Physical Exam Details Ricardo Brown Date of Service: 01/20/2017 9:15 AM Patient Name: Ricardo Brown.  Patient Account Number: [id number] Medical Record Treating RN: Montey Hora [id number] Number: Other Clinician: Date of Birth/Sex: [birth year] (56 y.o. Male) Treating Jazzman Loughmiller, [place] Primary Care Provider: [name] Provider/Extender: G Referring Provider: Princess Perna in Treatment: 1 Constitutional Patient is hypertensive.. Pulse regular and within target range for patient.[name] Respirations regular, non-labored  and within target range.. Temperature is normal and within the target range for the patient.[name] appears in no distress. Cardiovascular Pedal pulses palpable on the right dorsalis pedis and posterior tibial. Notes Wound exam; and improvement in the wound both in terms of visually and measurements. Today at 0.5 x 0.5 x 0.6 versus 1.3 x 0.8 x 0.9 last week. He tells me he is being religious and offloading and using his Darco offloading boot. There is no evidence of surrounding infection including no tenderness and no erythema Electronic Signature(s) Signed: 01/20/2017 5:43:18 PM By: Linton Ham MD Entered By: Linton Ham on 01/20/2017 10:04:07 Markwardt, Ricardo Brown ([id number]) -------------------------------------------------------------------------------- Physician Orders Details Ricardo Brown Date of Service: 01/20/2017 9:15 AM Patient Name: Ricardo Brown. Patient Account Number: [id number] Medical Record Treating RN: Montey Hora [id number] Number: Other Clinician: Date of Birth/Sex: 06-20-1961 (56 y.o. Male) Treating Ricardo Brown, [place] Primary Care Provider: [name] Provider/Extender: G Referring Provider: Princess Perna in Treatment: 1 Verbal / Phone Orders: No Diagnosis Coding Wound Cleansing Wound #1 Right,Plantar Foot o Clean wound with Normal Saline. o May Shower, gently pat wound dry prior to applying new dressing. Anesthetic Wound #1 Right,Plantar Foot o Topical Lidocaine 4% cream applied to wound bed prior to debridement Primary Wound  Dressing Wound #1 Right,Plantar Foot o Aquacel Ag Secondary Dressing Wound #1 Right,Plantar Foot o Gauze and Kerlix/Conform - secure lightly with coban Dressing Change Frequency Wound #1 Right,Plantar Foot o Change dressing every other day. Follow-up Appointments Wound #1 Right,Plantar Foot o Return Appointment in 1 week. Edema Control Wound #1 Right,Plantar Foot o Elevate legs to the level of the heart and pump ankles as often as possible Additional Orders / Instructions Wound #1 Right,Plantar Foot o Increase protein intake. o Other: - Please work to keep blood sugars below 180 JARROD, MCENERY ([id number]) Electronic Signature(s) Signed: 01/20/2017 5:43:18 PM By: Linton Ham MD Signed: 01/20/2017 5:44:09 PM By: Montey Hora Entered By: Montey Hora on 01/20/2017 09:58:36 Rund, Ricardo Brown ([id number]) -------------------------------------------------------------------------------- Problem List Details Ricardo Brown Date of Service: 01/20/2017 9:15 AM Patient Name: Ricardo Brown. Patient Account Number: [id number] Medical Record Treating RN: Montey Hora [id number] Number: Other Clinician: Date of Birth/Sex: 03/01/1961 (55 y.o. 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 Patient Account Number: [id number] Medical Record Treating RN: Montey Hora [id number] Number: Other Clinician: Date of Birth/Sex: 1961/02/21 (56 y.o. Male) Treating Linton Ham Primary Care Provider: [name] Provider/Extender: G Referring Provider: [name] Weeks in Treatment: 1 Subjective History of Present Illness (HPI) 01/13/17; this is a 56 year old man who is type II diabetic. Over the last month he noted callus and wound development on the plantar aspect of his right foot with the wound center today roughly the second metatarsal head. He has been using Neosporin and a dry dressing. The patient relates some history of numbness in the foot. He does not have known vascular disease or claudication-type symptoms. He is a nonsmoker. He is unaware  of his hemoglobin A1c but has an appointment with his primary physician. Primary physician mentioned toe diabetic shoes although he does not have them yet. He states that the shoes he wears to work were wearing down in the forefoot and he thinks he puts to much pressure on this area. He gives a history of an area on the plantar left foot about 6 months ago which sounds a lot like a blister that responded well to offloading. A1c in this clinic is noncompressible bilaterally 01/20/17; patient arrives today with an improved-looking wound. Measurements at 5 x 0.5 x 0.6 units surface of this appears to be healthy. Rolled edges of skin and subcutaneous tissue needed debridement however in general everything looks better. Plain x-ray of the foot was a bit indeterminant but did not show obvious bone destruction. As the patient is improving I'll forego an MRI for the moment Objective Constitutional Patient is hypertensive.. Pulse regular and within  target range for patient.[name] Respirations regular, non-labored and within target range.. Temperature is normal and within the target range for the patient.[name] appears in no distress. Vitals Time Taken: 9:36 AM, Brown: 69 in, Weight: 220 lbs, BMI: 32.5, Temperature: 98.4 F, Pulse: 84 bpm, Respiratory Rate: 18 breaths/min, Blood Pressure: 163/81 mmHg. Cardiovascular Pedal pulses palpable on the right dorsalis pedis and posterior tibial. Macpherson, Ricardo Brown ([id number]) General Notes: Wound exam; and improvement in the wound both in terms of visually and measurements. Today at 0.5 x 0.5 x 0.6 versus 1.3 x 0.8 x 0.9 last week. He tells me he is being religious and offloading and using his Darco offloading boot. There is no evidence of surrounding infection including no tenderness and no erythema Integumentary (Hair, Skin) Wound #1 status is Open. Original cause of wound was Gradually Appeared. The wound is located on the [place]. The wound measures 0.8cm length x 0.5cm width x 0.6cm depth; 0.314cm^2 area and 0.188cm^3 volume. There is no tunneling or undermining noted. There is a large amount of serous drainage noted. The wound margin is flat and intact. There is medium (34-66%) pink granulation within the wound bed. There is a medium (34-66%) amount of necrotic tissue within the wound bed including Adherent Slough. The periwound skin appearance exhibited: Callus. The periwound skin appearance did not exhibit: Crepitus, Excoriation, Induration, Rash, Scarring, Dry/Scaly, Maceration, Atrophie Blanche, Cyanosis, Ecchymosis, Hemosiderin Staining, Mottled, Pallor, Rubor, Erythema. Periwound temperature was noted as No Abnormality. Assessment Active Problems ICD-10 E11.621 - Type 2 diabetes mellitus with foot ulcer L97.513 - Non-pressure chronic ulcer of other part of right foot with necrosis of muscle E11.42 - Type 2 diabetes mellitus with diabetic polyneuropathy Procedures Wound  #1 Pre-procedure diagnosis of Wound #1 is a Diabetic Wound/Ulcer of the Lower Extremity located on the Right,Plantar Foot .Severity of Tissue Pre Debridement is: Fat layer exposed. There was a Skin/Subcutaneous Tissue Debridement ([id number]) debridement with total area of 0.4 sq cm performed by [name], MD. with the following instrument(s): Curette to remove Viable and Non-Viable tissue/material including Fibrin/Slough, Callus, and Subcutaneous after achieving pain control using Lidocaine 4% Topical Solution. A time out was conducted at 09:55, prior to the start of the procedure. A Minimum amount of bleeding was controlled with Pressure. The procedure was tolerated well with a pain level of 0 throughout and a pain level of 0 following the procedure. Post Debridement Measurements: 0.8cm length x 0.5cm width x 0.7cm depth; 0.22cm^3 volume. Character of Wound/Ulcer Post Debridement is improved. Severity of Tissue Post Debridement is: Fat  layer exposed. Post procedure Diagnosis Wound #1: Same as Pre-Procedure MURL, GOLLADAY ([id number]) Plan Wound Cleansing: Wound #1 Right,Plantar Foot: Clean wound with Normal Saline. May Shower, gently pat wound dry prior to applying new dressing. Anesthetic: Wound #1 Right,Plantar Foot: Topical Lidocaine 4% cream applied to wound bed prior to debridement Primary Wound Dressing: Wound #1 Right,Plantar Foot: Aquacel Ag Secondary Dressing: Wound #1 Right,Plantar Foot: Gauze and Kerlix/Conform - secure lightly with coban Dressing Change Frequency: Wound #1 Right,Plantar Foot: Change dressing every other day. Follow-up Appointments: Wound #1 Right,Plantar Foot: Return Appointment in 1 week. Edema Control: Wound #1 Right,Plantar Foot: Elevate legs to the level of the heart and pump ankles as often as possible Additional Orders / Instructions: Wound #1 Right,Plantar Foot: Increase protein intake. Other: - Please work to keep blood  sugars below 180 #1 a nice improvement in the condition of this man's wound over the right second metatarsal head #2 his plain x-ray was a bit indeterminant in terms of the report however given the patient's improvement this week all forego additional imaging for now #3 also had noncompressible vessels however his peripheral pulses are easily palpable, 2 papillary refill time is normal and as long as this wound is progressing no formal arterial studies. #4 no evidence of surrounding infection XION, DEBRUYNE ([id number]) Electronic Signature(s) Signed: 01/20/2017 5:43:18 PM By: Linton Ham MD Entered By: Linton Ham on 01/20/2017 10:05:22 Rizzi, Ricardo Brown ([id number]) -------------------------------------------------------------------------------- SuperBill Details Nicklin, Ricardo Brown Date of Service: 01/20/2017 Patient Name: Ricardo Brown. Patient Account Number: [id number] Medical Record Treating RN: Montey Hora [id number] Number: Other Clinician: Date of Birth/Sex: 1961/03/17 (56 y.o. Male) Treating Wilborn Membreno, [place] Primary Care Provider: [name] Provider/Extender: G Referring Provider: [name] Weeks in Treatment: 1 Diagnosis Coding ICD-10 Codes Code Description E11.621 Type 2 diabetes mellitus with foot ulcer L97.513 Non-pressure chronic ulcer of other part of right foot with necrosis of muscle E11.42 Type 2 diabetes mellitus with diabetic polyneuropathy Facility Procedures CPT4: Description Modifier Quantity Code [id number] 11042 - DEB SUBQ TISSUE 20 SQ CM/< 1 ICD-10 Description Diagnosis E11.621 Type 2 diabetes mellitus with foot ulcer L97.513 Non-pressure chronic ulcer of other part of right foot with necrosis of muscle Physician Procedures CPT4: Description Modifier Quantity Code [id number] - WC PHYS SUBQ TISS 20 SQ CM 1 ICD-10 Description Diagnosis E11.621 Type 2 diabetes mellitus with foot ulcer L97.513 Non-pressure chronic ulcer of other part of right foot  with necrosis of muscle Electronic Signature(s) Signed: 01/20/2017 5:43:18 PM By: Linton Ham MD Entered By: Linton Ham on 01/20/2017 10:05:41

## 2017-01-27 ENCOUNTER — Encounter: Payer: BLUE CROSS/BLUE SHIELD | Admitting: Internal Medicine

## 2017-01-27 DIAGNOSIS — E11621 Type 2 diabetes mellitus with foot ulcer: Secondary | ICD-10-CM | POA: Diagnosis not present

## 2017-01-27 DIAGNOSIS — Z7984 Long term (current) use of oral hypoglycemic drugs: Secondary | ICD-10-CM | POA: Diagnosis not present

## 2017-01-27 DIAGNOSIS — L97512 Non-pressure chronic ulcer of other part of right foot with fat layer exposed: Secondary | ICD-10-CM | POA: Diagnosis not present

## 2017-01-27 DIAGNOSIS — E1142 Type 2 diabetes mellitus with diabetic polyneuropathy: Secondary | ICD-10-CM | POA: Diagnosis not present

## 2017-01-27 DIAGNOSIS — I10 Essential (primary) hypertension: Secondary | ICD-10-CM | POA: Diagnosis not present

## 2017-01-27 DIAGNOSIS — L97513 Non-pressure chronic ulcer of other part of right foot with necrosis of muscle: Secondary | ICD-10-CM | POA: Diagnosis not present

## 2017-01-28 DIAGNOSIS — I471 Supraventricular tachycardia: Secondary | ICD-10-CM | POA: Diagnosis not present

## 2017-01-28 DIAGNOSIS — E1165 Type 2 diabetes mellitus with hyperglycemia: Secondary | ICD-10-CM | POA: Diagnosis not present

## 2017-01-28 DIAGNOSIS — I10 Essential (primary) hypertension: Secondary | ICD-10-CM | POA: Diagnosis not present

## 2017-01-28 DIAGNOSIS — E119 Type 2 diabetes mellitus without complications: Secondary | ICD-10-CM | POA: Diagnosis not present

## 2017-01-29 NOTE — Progress Notes (Signed)
KOLBY, SCHARA ([id number]) Visit Report for 01/27/2017 Arrival Information Details Patient Name: Ricardo Brown, Ricardo Brown. Date of Service: 01/27/2017 9:15 AM Medical Record Number: [id number] Patient Account Number: [id number] Date of Birth/Sex: 1960/08/12 (56 y.o. Male) Treating RN: Montey Hora Primary Care Neamiah Sciarra: [name] Other Clinician: Referring Lyndsy Gilberto: [name] Treating Shekela Goodridge/Extender: Tito Dine in Treatment: 2 Visit Information History Since Last Visit Added or deleted any medications: No Patient Arrived: Ambulatory Any new allergies or adverse reactions: No Arrival Time: 09:24 Had a fall or experienced change in No Accompanied By: dtr activities of daily living that may affect Transfer Assistance: None risk of falls: Patient Identification Verified: Yes Signs or symptoms of abuse/neglect since last No Secondary Verification Yes visito Process Completed: Hospitalized since last visit: No Patient Has Alerts: Yes Has Dressing in Place as Prescribed: Yes Patient Alerts: DMII Has Footwear/Offloading in Place as Yes ABI Walker BILATERAL Prescribed: >220 Right: Wedge Shoe Pain Present Now: No Electronic Signature(s) Signed: 01/27/2017 4:47:45 PM By: Montey Hora Entered By: Montey Hora on 01/27/2017 09:24:57 Torain, [name] ([id number]) -------------------------------------------------------------------------------- Clinic Level of Care Assessment Details Patient Name: Ricardo Brown Date of Service: 01/27/2017 9:15 AM Medical Record Number: [id number] Patient Account Number: [id number] Date of Birth/Sex: 06/30/1961 (56 y.o. Male) Treating RN: Montey Hora Primary Care Sheretta Grumbine: [name] Other Clinician: Referring Avanelle Pixley: [name] Treating Mylei Brackeen/Extender: Tito Dine in Treatment: 2 Clinic Level of Care Assessment Items TOOL 4 Quantity Score []  - Use when only an EandM is performed on  FOLLOW-UP visit 0 ASSESSMENTS - Nursing Assessment / Reassessment X - Reassessment of Co-morbidities (includes updates in patient status) 1 10 X - Reassessment of Adherence to Treatment Plan 1 5 ASSESSMENTS - Wound and Skin Assessment / Reassessment X - Simple Wound Assessment / Reassessment - one wound 1 5 []  - Complex Wound Assessment / Reassessment - multiple wounds 0 []  - Dermatologic / Skin Assessment (not related to wound area) 0 ASSESSMENTS - Focused Assessment []  - Circumferential Edema Measurements - multi extremities 0 []  - Nutritional Assessment / Counseling / Intervention 0 X - Lower Extremity Assessment (monofilament, tuning fork, pulses) 1 5 []  - Peripheral Arterial Disease Assessment (using hand held doppler) 0 ASSESSMENTS - Ostomy and/or Continence Assessment and Care []  - Incontinence Assessment and Management 0 []  - Ostomy Care Assessment and Management (repouching, etc.) 0 PROCESS - Coordination of Care X - Simple Patient / Family Education for ongoing care 1 15 []  - Complex (extensive) Patient / Family Education for ongoing care 0 []  - Staff obtains Programmer, systems, Records, Test Results / Process Orders 0 []  - Staff telephones HHA, Nursing Homes / Clarify orders / etc 0 []  - Routine Transfer to another Facility (non-emergent condition) 0 Sargeant, [name] ([id number]) []  - Routine Hospital Admission (non-emergent condition) 0 []  - New Admissions / Biomedical engineer / Ordering NPWT, Apligraf, etc. 0 []  - Emergency Hospital Admission (emergent condition) 0 X - Simple Discharge Coordination 1 10 []  - Complex (extensive) Discharge Coordination 0 PROCESS - Special Needs []  - Pediatric / Minor Patient Management 0 []  - Isolation Patient Management 0 []  - Hearing / Language / Visual special needs 0 []  - Assessment of Community assistance (transportation, D/C planning, etc.) 0 []  - Additional assistance / Altered mentation 0 []  - Support Surface(s) Assessment (bed,  cushion, seat, etc.) 0 INTERVENTIONS - Wound Cleansing / Measurement X - Simple Wound Cleansing - one wound 1 5 []  - Complex Wound Cleansing - multiple wounds 0  X - Wound Imaging (photographs - any number of wounds) 1 5 []  - Wound Tracing (instead of photographs) 0 X - Simple Wound Measurement - one wound 1 5 []  - Complex Wound Measurement - multiple wounds 0 INTERVENTIONS - Wound Dressings X - Small Wound Dressing one or multiple wounds 1 10 []  - Medium Wound Dressing one or multiple wounds 0 []  - Large Wound Dressing one or multiple wounds 0 []  - Application of Medications - topical 0 []  - Application of Medications - injection 0 INTERVENTIONS - Miscellaneous []  - External ear exam 0 Provence, [name] ([id number]) []  - Specimen Collection (cultures, biopsies, blood, body fluids, etc.) 0 []  - Specimen(s) / Culture(s) sent or taken to Lab for analysis 0 []  - Patient Transfer (multiple staff / Harrel Lemon Lift / Similar devices) 0 []  - Simple Staple / Suture removal (25 or less) 0 []  - Complex Staple / Suture removal (26 or more) 0 []  - Hypo / Hyperglycemic Management (close monitor of Blood Glucose) 0 []  - Ankle / Brachial Index (ABI) - do not check if billed separately 0 X - Vital Signs 1 5 Has the patient been seen at the hospital within the last three years: Yes Total Score: 80 Level Of Care: New/Established - Level 3 Electronic Signature(s) Signed: 01/27/2017 4:47:45 PM By: Montey Hora Entered By: Montey Hora on 01/27/2017 10:33:26 Boyan, [name] ([id number]) -------------------------------------------------------------------------------- Encounter Discharge Information Details Patient Name: Ricardo Brown Date of Service: 01/27/2017 9:15 AM Medical Record Number: [id number] Patient Account Number: [id number] Date of Birth/Sex: [birth year] (56 y.o. Male) Treating RN: Montey Hora Primary Care Briel Gallicchio: [name] Other Clinician: Referring Haly Feher:  [name] Treating Natividad Halls/Extender: Tito Dine in Treatment: 2 Encounter Discharge Information Items Discharge Pain Level: 0 Discharge Condition: Stable Ambulatory Status: Ambulatory Discharge Destination: Home Transportation: Private Auto Accompanied By: dtr Schedule Follow-up Appointment: Yes Medication Reconciliation completed and provided to Patient/Care No Leiby Pigeon: Provided on Clinical Summary of Care: 01/27/2017 Form Type Recipient Paper Patient WM Electronic Signature(s) Signed: 01/27/2017 10:10:40 AM By: Ruthine Dose Entered By: Ruthine Dose on 01/27/2017 10:10:40 Schlender, [name] ([id number]) -------------------------------------------------------------------------------- Lower Extremity Assessment Details Patient Name: Ricardo Brown Date of Service: 01/27/2017 9:15 AM Medical Record Number: [id number] Patient Account Number: [id number] Date of Birth/Sex: 1961-03-26 (56 y.o. Male) Treating RN: Montey Hora Primary Care Verenise Moulin: [name] Other Clinician: Referring Shameeka Silliman: [name] Treating Rhylen Shaheen/Extender: [name] Weeks in Treatment: 2 Vascular Assessment Pulses: Dorsalis Pedis Palpable: [Right:Yes] Posterior Tibial Extremity colors, hair growth, and conditions: Extremity Color: [Right:Normal] Hair Growth on Extremity: [Right:No] Temperature of Extremity: [Right:Warm] Capillary Refill: [Right:< 3 seconds] Electronic Signature(s) Signed: 01/27/2017 4:47:45 PM By: Montey Hora Entered By: Montey Hora on 01/27/2017 10:01:39 Hodgman, [name] ([id number]) -------------------------------------------------------------------------------- Multi Wound Chart Details Patient Name: Ricardo Brown Date of Service: 01/27/2017 9:15 AM Medical Record Number: [id number] Patient Account Number: [id number] Date of Birth/Sex: 12/06/1960 (56 y.o. Male) Treating RN: Montey Hora Primary Care Jacoya Bauman:  [name] Other Clinician: Referring Austan Nicholl: [name] Treating Juda Lajeunesse/Extender: [name] Weeks in Treatment: 2 Vital Signs Height(in): 69 Pulse(bpm): 46 Weight(lbs): 220 Blood Pressure 167/69 (mmHg): Body Mass Index(BMI): 32 Temperature(F): 98.5 Respiratory Rate 18 (breaths/min): Photos: [1:No Photos] [N/A:N/A] Wound Location: [1:Right Foot - Plantar] [N/A:N/A] Wounding Event: [1:Gradually Appeared] [N/A:N/A] Primary Etiology: [1:Diabetic Wound/Ulcer of the Lower Extremity] [N/A:N/A] Comorbid History: [1:Hypertension, Type II Diabetes, Neuropathy] [N/A:N/A] Date Acquired: [1:12/14/2016] [N/A:N/A] Weeks of Treatment: [1:2] [N/A:N/A] Wound Status: [1:Open] [N/A:N/A] Measurements L x W x  D 0.5x0.3x0.2 [N/A:N/A] (cm) Area (cm) : [1:0.118] [N/A:N/A] Volume (cm) : [1:0.024] [N/A:N/A] % Reduction in Area: [1:85.60%] [N/A:N/A] % Reduction in Volume: 96.70% [N/A:N/A] Classification: [1:Grade 1] [N/A:N/A] Exudate Amount: [1:Large] [N/A:N/A] Exudate Type: [1:Serous] [N/A:N/A] Exudate Color: [1:amber] [N/A:N/A] Wound Margin: [1:Flat and Intact] [N/A:N/A] Granulation Amount: [1:Large (67-100%)] [N/A:N/A] Granulation Quality: [1:Pink] [N/A:N/A] Necrotic Amount: [1:Small (1-33%)] [N/A:N/A] Exposed Structures: [1:Fascia: No Fat Layer (Subcutaneous Tissue) Exposed: No Tendon: No Muscle: No] [N/A:N/A] Joint: No Bone: No Epithelialization: None N/A N/A Periwound Skin Texture: Callus: Yes N/A N/A Excoriation: No Induration: No Crepitus: No Rash: No Scarring: No Periwound Skin Maceration: No N/A N/A Moisture: Dry/Scaly: No Periwound Skin Color: Atrophie Blanche: No N/A N/A Cyanosis: No Ecchymosis: No Erythema: No Hemosiderin Staining: No Mottled: No Pallor: No Rubor: No Temperature: No Abnormality N/A N/A Tenderness on No N/A N/A Palpation: Wound Preparation: Ulcer Cleansing: N/A N/A Rinsed/Irrigated with Saline Topical Anesthetic Applied:  Other: lidocaine 4% Treatment Notes Electronic Signature(s) Signed: 01/27/2017 5:06:34 PM By: Linton Ham MD Entered By: Linton Ham on 01/27/2017 10:22:25 Guinther, [name] ([id number]) -------------------------------------------------------------------------------- [place] Details Patient Name: Ricardo Brown Date of Service: 01/27/2017 9:15 AM Medical Record Number: [id number] Patient Account Number: [id number] Date of Birth/Sex: [birth year] (56 y.o. Male) Treating RN: Montey Hora Primary Care Linsy Ehresman: [name] Other Clinician: Referring Radha Coggins: [name] Treating Pericles Carmicheal/Extender: Tito Dine in Treatment: 2 Active Inactive ` Orientation to the Wound Care Program Nursing Diagnoses: Knowledge deficit related to the wound healing center program Goals: Patient/caregiver will verbalize understanding of the [facility] Program Date Initiated: 01/13/2017 Target Resolution Date: 03/26/2017 Goal Status: Active Interventions: Provide education on orientation to the wound center Notes: ` Peripheral Neuropathy Nursing Diagnoses: Knowledge deficit related to disease process and management of peripheral neurovascular dysfunction Goals: Patient/caregiver will verbalize understanding of disease process and disease management Date Initiated: 01/13/2017 Target Resolution Date: 03/26/2017 Goal Status: Active Interventions: Assess signs and symptoms of neuropathy upon admission and as needed Notes: ` Wound/Skin Impairment Nursing Diagnoses: Impaired tissue integrity ASHBY, MOSKAL ([id number]) Goals: Patient/caregiver will verbalize understanding of skin care regimen Date Initiated: 01/13/2017 Target Resolution Date: 03/26/2017 Goal Status: Active Ulcer/skin breakdown will have a volume reduction of 30% by week 4 Date Initiated: 01/13/2017 Target Resolution Date: 03/26/2017 Goal Status: Active Ulcer/skin  breakdown will have a volume reduction of 50% by week 8 Date Initiated: 01/13/2017 Target Resolution Date: 03/26/2017 Goal Status: Active Ulcer/skin breakdown will have a volume reduction of 80% by week 12 Date Initiated: 01/13/2017 Target Resolution Date: 03/26/2017 Goal Status: Active Ulcer/skin breakdown will heal within 14 weeks Date Initiated: 01/13/2017 Target Resolution Date: 03/26/2017 Goal Status: Active Interventions: Assess patient/caregiver ability to obtain necessary supplies Assess patient/caregiver ability to perform ulcer/skin care regimen upon admission and as needed Assess ulceration(s) every visit Notes: Electronic Signature(s) Signed: 01/27/2017 4:47:45 PM By: Montey Hora Entered By: Montey Hora on 01/27/2017 10:01:46 Curb, [name] ([id number]) -------------------------------------------------------------------------------- Pain Assessment Details Patient Name: Ricardo Brown Date of Service: 01/27/2017 9:15 AM Medical Record Number: [id number] Patient Account Number: [id number] Date of Birth/Sex: 1960/08/27 (56 y.o. Male) Treating RN: Montey Hora Primary Care Orlo Brickle: [name] Other Clinician: Referring Delphia Kaylor: [name] Treating Quinto Tippy/Extender: [name] Weeks in Treatment: 2 Active Problems Location of Pain Severity and Description of Pain Patient Has Paino No Site Locations Pain Management and Medication Current Pain Management: Notes Topical or injectable lidocaine is offered to patient for acute pain when surgical debridement is performed. If needed, Patient is  instructed to use over the counter pain medication for the following 24-48 hours after debridement. Wound care MDs do not prescribed pain medications. Patient has chronic pain or uncontrolled pain. Patient has been instructed to make an appointment with their Primary Care Physician for pain management. Electronic Signature(s) Signed: 01/27/2017 4:47:45  PM By: Montey Hora Entered By: Montey Hora on 01/27/2017 09:25:48 Grayson, [name] ([id number]) -------------------------------------------------------------------------------- Patient/Caregiver Education Details [name] Date of Service: 01/27/2017 9:15 AM Patient Name: V. Patient Account Number: [id number] Medical Record Treating RN: Montey Hora [id number] Number: Other Clinician: Date of Birth/Gender: 1960/08/28 (56 y.o. Male) Treating ROBSON, [place] Primary Care Physician: [name] Physician/Extender: G Referring Physician: Princess Perna in Treatment: 2 Education Assessment Education Provided To: Patient and Caregiver Education Topics Provided Wound/Skin Impairment: Handouts: Other: wound care as ordered Methods: Demonstration, Explain/Verbal Responses: State content correctly Electronic Signature(s) Signed: 01/27/2017 4:47:45 PM By: Montey Hora Entered By: Montey Hora on 01/27/2017 10:02:30 Golonka, [name] ([id number]) -------------------------------------------------------------------------------- Wound Assessment Details Patient Name: Ricardo Brown Date of Service: 01/27/2017 9:15 AM Medical Record Number: [id number] Patient Account Number: [id number] Date of Birth/Sex: 1960/09/03 (56 y.o. Male) Treating RN: Montey Hora Primary Care Mariaha Ellington: [name] Other Clinician: Referring Mirna Sutcliffe: [name] Treating Jamesetta Greenhalgh/Extender: [name] Weeks in Treatment: 2 Wound Status Wound Number: 1 Primary Diabetic Wound/Ulcer of the Lower Etiology: Extremity Wound Location: Right Foot - Plantar Wound Status: Open Wounding Event: Gradually Appeared Comorbid Hypertension, Type II Diabetes, Date Acquired: 12/14/2016 History: Neuropathy Weeks Of Treatment: 2 Clustered Wound: No Photos Photo Uploaded By: Montey Hora on 01/27/2017 10:37:59 Wound Measurements Length: (cm) 0.5 Width: (cm) 0.3 Depth: (cm)  0.2 Area: (cm) 0.118 Volume: (cm) 0.024 % Reduction in Area: 85.6% % Reduction in Volume: 96.7% Epithelialization: None Tunneling: No Undermining: No Wound Description Classification: Grade 1 Wound Margin: Flat and Intact Exudate Amount: Large Exudate Type: Serous Exudate Color: amber Foul Odor After Cleansing: No Slough/Fibrino Yes Wound Bed Granulation Amount: Large (67-100%) Exposed Structure Granulation Quality: Pink Fascia Exposed: No Necrotic Amount: Small (1-33%) Fat Layer (Subcutaneous Tissue) Exposed: No Necrotic Quality: Adherent Slough Tendon Exposed: No Garman, [name] ([id number]) Muscle Exposed: No Joint Exposed: No Bone Exposed: No Periwound Skin Texture Texture Color No Abnormalities Noted: No No Abnormalities Noted: No Callus: Yes Atrophie Blanche: No Crepitus: No Cyanosis: No Excoriation: No Ecchymosis: No Induration: No Erythema: No Rash: No Hemosiderin Staining: No Scarring: No Mottled: No Pallor: No Moisture Rubor: No No Abnormalities Noted: No Dry / Scaly: No Temperature / Pain Maceration: No Temperature: No Abnormality Wound Preparation Ulcer Cleansing: Rinsed/Irrigated with Saline Topical Anesthetic Applied: Other: lidocaine 4%, Treatment Notes Wound #1 (Right, Plantar Foot) 1. Cleansed with: Clean wound with Normal Saline 2. Anesthetic Topical Lidocaine 4% cream to wound bed prior to debridement 4. Dressing Applied: Aquacel Ag 5. Secondary Dressing Applied Gauze and Kerlix/Conform 7. Secured with Tape Notes coban lightly Electronic Signature(s) Signed: 01/27/2017 4:47:45 PM By: Montey Hora Entered By: Montey Hora on 01/27/2017 09:30:21 Vaile, [name] ([id number]) -------------------------------------------------------------------------------- [place] Details Patient Name: Ricardo Brown Date of Service: 01/27/2017 9:15 AM Medical Record Number: [id number] Patient Account Number:  [id number] Date of Birth/Sex: 1961-07-26 (56 y.o. Male) Treating RN: Montey Hora Primary Care Macario Shear: [name] Other Clinician: Referring Iysis Germain: [name] Treating Lavine Hargrove/Extender: [name] Weeks in Treatment: 2 Vital Signs Time Taken: 09:25 Temperature (F): 98.5 Height (in): 69 Pulse (bpm): 46 Weight (lbs): 220 Respiratory Rate (breaths/min): 18 Body Mass Index (BMI): 32.5 Blood Pressure (mmHg): 167/69  Reference Range: 80 - 120 mg / dl Electronic Signature(s) Signed: 01/27/2017 4:47:45 PM By: Montey Hora Entered By: Montey Hora on 01/27/2017 09:26:10

## 2017-01-29 NOTE — Progress Notes (Signed)
ZYIR, GASSERT ([id number]) Visit Report for 01/27/2017 Chief Complaint Document Details AHYAN, KREEGER Date of Service: 01/27/2017 9:15 AM Patient Name: V. Patient Account Number: [id number] Medical Record Treating RN: Montey Hora [id number] Number: Other Clinician: Date of Birth/Sex: 04-28-1961 (56 y.o. Male) Treating Linton Ham Primary Care Provider: [name] Provider/Extender: G Referring Provider: Princess Perna in Treatment: 2 Information Obtained from: Patient Chief Complaint 01/13/17; patient is here for a review of a foot ulcer on the plantar aspect of his right foot in the setting of type 2 diabetes Electronic Signature(s) Signed: 01/27/2017 5:06:34 PM By: Linton Ham MD Entered By: Linton Ham on 01/27/2017 10:22:35 Hunkele, [name] ([id number]) -------------------------------------------------------------------------------- HPI Details [name] Date of Service: 01/27/2017 9:15 AM Patient Name: V. Patient Account Number: [id number] Medical Record Treating RN: Montey Hora [id number] Number: Other Clinician: Date of Birth/Sex: 1961/02/17 (56 y.o. Male) Treating Linton Ham Primary Care Provider: [name] Provider/Extender: G Referring Provider: Princess Perna in Treatment: 2 History of Present Illness HPI Description: 01/13/17; this is a 56 year old man who is type II diabetic. Over the last month he noted callus and wound development on the plantar aspect of his right foot with the wound center today roughly the second metatarsal head. He has been using Neosporin and a dry dressing. The patient relates some history of numbness in the foot. He does not have known vascular disease or claudication-type symptoms. He is a nonsmoker. He is unaware of his hemoglobin A1c but has an appointment with his primary physician. Primary physician mentioned toe diabetic shoes although he does not have them yet. He  states that the shoes he wears to work were wearing down in the forefoot and he thinks he puts to much pressure on this area. He gives a history of an area on the plantar left foot about 6 months ago which sounds a lot like a blister that responded well to offloading. A1c in this clinic is noncompressible bilaterally 01/20/17; patient arrives today with an improved-looking wound. Measurements at 5 x 0.5 x 0.6 units surface of this appears to be healthy. Rolled edges of skin and subcutaneous tissue needed debridement however in general everything looks better. Plain x-ray of the foot was a bit indeterminant but did not show obvious bone destruction. As the patient is improving I'll forego an MRI for the moment 01/28/17; patient's wound is smaller. Still some rolled edges around the wound but nothing looked like it required debridement. Continuing to use silver alginate. Patient is going to the beach next week we will see him back in 2 weeks Electronic Signature(s) Signed: 01/27/2017 5:06:34 PM By: Linton Ham MD Entered By: Linton Ham on 01/27/2017 10:23:34 Mccarley, [name] ([id number]) -------------------------------------------------------------------------------- Physical Exam Details [name] Date of Service: 01/27/2017 9:15 AM Patient Name: V. Patient Account Number: [id number] Medical Record Treating RN: Montey Hora [id number] Number: Other Clinician: Date of Birth/Sex: 11/04/1960 (55 y.o. Male) Treating ROBSON, [place] Primary Care Provider: [name] Provider/Extender: G Referring Provider: Princess Perna in Treatment: 2 Constitutional Patient is hypertensive.. Pulse regular and within target range for patient.[name] Respirations regular, non-labored and within target range.. Temperature is normal and within the target range for the patient.[name] appears in no distress. Notes Wound exam; dimensions are better. Ace of the wound looks healthy. He does have  some thick skin and subcutaneous tissue around the wound although I didn't think any of this really need debridement. The edges were not rolled there is no evidence of infection Electronic Signature(s) Signed:  01/27/2017 5:06:34 PM By: Linton Ham MD Entered By: Linton Ham on 01/27/2017 10:24:39 Belanger, [name] ([id number]) -------------------------------------------------------------------------------- Physician Orders Details [name] Date of Service: 01/27/2017 9:15 AM Patient Name: V. Patient Account Number: [id number] Medical Record Treating RN: Montey Hora [id number] Number: Other Clinician: Date of Birth/Sex: March 23, 1961 (56 y.o. Male) Treating ROBSON, [place] Primary Care Provider: [name] Provider/Extender: G Referring Provider: Princess Perna in Treatment: 2 Verbal / Phone Orders: No Diagnosis Coding Wound Cleansing Wound #1 Right,Plantar Foot o Clean wound with Normal Saline. o May Shower, gently pat wound dry prior to applying new dressing. Anesthetic Wound #1 Right,Plantar Foot o Topical Lidocaine 4% cream applied to wound bed prior to debridement Primary Wound Dressing Wound #1 Right,Plantar Foot o Aquacel Ag Secondary Dressing Wound #1 Right,Plantar Foot o Gauze and Kerlix/Conform - secure lightly with coban Dressing Change Frequency Wound #1 Right,Plantar Foot o Change dressing every other day. Follow-up Appointments Wound #1 Right,Plantar Foot o Return Appointment in 1 week. Edema Control Wound #1 Right,Plantar Foot o Elevate legs to the level of the heart and pump ankles as often as possible Additional Orders / Instructions Wound #1 Right,Plantar Foot o Increase protein intake. o Other: - Please work to keep blood sugars below 180 TREG, DIEMER ([id number]) Electronic Signature(s) Signed: 01/27/2017 4:47:45 PM By: Montey Hora Signed: 01/27/2017 5:06:34 PM By: Linton Ham MD Entered  By: Montey Hora on 01/27/2017 10:02:56 Cuccaro, [name] ([id number]) -------------------------------------------------------------------------------- Problem List Details [name] Date of Service: 01/27/2017 9:15 AM Patient Name: V. Patient Account Number: [id number] Medical Record Treating RN: Montey Hora [id number] Number: Other Clinician: Date of Birth/Sex: [birth year] (55 y.o. 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 Patient Account Number: [id number] Medical Record Treating RN: Montey Hora [id number] Number: Other Clinician: Date of Birth/Sex: [birth year] (55 y.o. Male) Treating Linton Ham Primary Care Provider: [name] Provider/Extender: G Referring Provider: Princess Perna in Treatment: 2 Subjective Chief Complaint Information obtained from Patient 01/13/17; patient is here for a review of a foot ulcer on the plantar aspect of his right foot in the setting of type 2 diabetes History of Present Illness (HPI) 01/13/17; this is a 56 year old man who is type II diabetic. Over the last month he noted callus and wound development on the plantar aspect of his right  foot with the wound center today roughly the second metatarsal head. He has been using Neosporin and a dry dressing. The patient relates some history of numbness in the foot. He does not have known vascular disease or claudication-type symptoms. He is a nonsmoker. He is unaware of his hemoglobin A1c but has an appointment with his primary physician. Primary physician mentioned toe diabetic shoes although he does not have them yet. He states that the shoes he wears to work were wearing down in the forefoot and he thinks he puts to much pressure on this area. He gives a history of an area on the  plantar left foot about 6 months ago which sounds a lot like a blister that responded well to offloading. A1c in this clinic is noncompressible bilaterally 01/20/17; patient arrives today with an improved-looking wound. Measurements at 5 x 0.5 x 0.6 units surface of this appears to be healthy. Rolled edges of skin and subcutaneous tissue needed debridement however in general everything looks better. Plain x-ray of the foot was a bit indeterminant but did not show obvious bone destruction. As the patient is improving I'll forego an MRI for the moment 01/28/17; patient's wound is smaller. Still some rolled edges around the wound but nothing looked like it required debridement. Continuing to use silver alginate. Patient is going to the beach next week we will see him back in 2 weeks [place], ROXAS CLYMER ([id number]) Constitutional Patient is hypertensive.. Pulse regular and within target range for patient.[name] Respirations regular, non-labored and within target range.. Temperature is normal and within the target range for the patient.[name] appears in no distress. Vitals Time Taken: 9:25 AM, Height: 69 in, Weight: 220 lbs, BMI: 32.5, Temperature: 98.5 F, Pulse: 46 bpm, Respiratory Rate: 18 breaths/min, Blood Pressure: 167/69 mmHg. General Notes: Wound exam; dimensions are better. Ace of the wound looks  healthy. He does have some thick skin and subcutaneous tissue around the wound although I didn't think any of this really need debridement. The edges were not rolled there is no evidence of infection Integumentary (Hair, Skin) Wound #1 status is Open. Original cause of wound was Gradually Appeared. The wound is located on the [place]. The wound measures 0.5cm length x 0.3cm width x 0.2cm depth; 0.118cm^2 area and 0.024cm^3 volume. There is no tunneling or undermining noted. There is a large amount of serous drainage noted. The wound margin is flat and intact. There is large (67-100%) pink granulation within the wound bed. There is a small (1-33%) amount of necrotic tissue within the wound bed including Adherent Slough. The periwound skin appearance exhibited: Callus. The periwound skin appearance did not exhibit: Crepitus, Excoriation, Induration, Rash, Scarring, Dry/Scaly, Maceration, Atrophie Blanche, Cyanosis, Ecchymosis, Hemosiderin Staining, Mottled, Pallor, Rubor, Erythema. Periwound temperature was noted as No Abnormality. Assessment Active Problems ICD-10 E11.621 - Type 2 diabetes mellitus with foot ulcer L97.513 - Non-pressure chronic ulcer of other part of right foot with necrosis of muscle E11.42 - Type 2 diabetes mellitus with diabetic polyneuropathy Plan Wound Cleansing: Wound #1 Right,Plantar Foot: Clean wound with Normal Saline. May Shower, gently pat wound dry prior to applying new dressing. Anesthetic: DANNE, VASEK ([id number]) Wound #1 Right,Plantar Foot: Topical Lidocaine 4% cream applied to wound bed prior to debridement Primary Wound Dressing: Wound #1 Right,Plantar Foot: Aquacel Ag Secondary Dressing: Wound #1 Right,Plantar Foot: Gauze and Kerlix/Conform - secure lightly with coban Dressing Change Frequency: Wound #1 Right,Plantar Foot: Change dressing every other day. Follow-up Appointments: Wound #1 Right,Plantar Foot: Return  Appointment in 1 week. Edema Control: Wound #1 Right,Plantar Foot: Elevate legs to the level of the heart and pump ankles as often as possible Additional Orders / Instructions: Wound #1 Right,Plantar Foot: Increase protein intake. Other: - Please work to keep blood sugars below 180 #1 continuing progressing #2 continue the same dressing which is Aquacel Ag #3 continue the darco forefoot offloading boot #4 patient is traveling to the beach next week, I gave him the usual precautions Electronic Signature(s) Signed: 01/27/2017 5:06:34 PM By: Linton Ham MD Entered By: Linton Ham on 01/27/2017 10:25:26 Frisina, [name] ([id number]) -------------------------------------------------------------------------------- SuperBill Details Galyean, [name] Date  of Service: 01/27/2017 Patient Name: V. Patient Account Number: [id number] Medical Record Treating RN: Montey Hora [id number] Number: Other Clinician: Date of Birth/Sex: 06/28/1961 (56 y.o. 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 E11.42 Type 2 diabetes mellitus with diabetic polyneuropathy Facility Procedures CPT4 Code: [id number] Description: 99213 - WOUND CARE VISIT-LEV 3 EST PT Modifier: Quantity: 1 Physician Procedures CPT4: Description Modifier Quantity Code [id number] 70962 - WC PHYS LEVEL 2 - EST PT 1 ICD-10 Description Diagnosis E11.621 Type 2 diabetes mellitus with foot ulcer L97.513 Non-pressure chronic ulcer of other part of right foot with necrosis of muscle Electronic Signature(s) Signed: 01/27/2017 10:33:35 AM By: Montey Hora Signed: 01/27/2017 5:06:34 PM By: Linton Ham MD Entered By: Montey Hora on 01/27/2017  10:33:34

## 2017-02-09 ENCOUNTER — Encounter: Payer: BLUE CROSS/BLUE SHIELD | Attending: Physician Assistant | Admitting: Physician Assistant

## 2017-02-09 DIAGNOSIS — I10 Essential (primary) hypertension: Secondary | ICD-10-CM | POA: Insufficient documentation

## 2017-02-09 DIAGNOSIS — L97513 Non-pressure chronic ulcer of other part of right foot with necrosis of muscle: Secondary | ICD-10-CM | POA: Diagnosis not present

## 2017-02-09 DIAGNOSIS — Z794 Long term (current) use of insulin: Secondary | ICD-10-CM | POA: Diagnosis not present

## 2017-02-09 DIAGNOSIS — E11621 Type 2 diabetes mellitus with foot ulcer: Secondary | ICD-10-CM | POA: Insufficient documentation

## 2017-02-09 DIAGNOSIS — E1142 Type 2 diabetes mellitus with diabetic polyneuropathy: Secondary | ICD-10-CM | POA: Insufficient documentation

## 2017-02-09 DIAGNOSIS — L97512 Non-pressure chronic ulcer of other part of right foot with fat layer exposed: Secondary | ICD-10-CM | POA: Diagnosis not present

## 2017-02-10 ENCOUNTER — Ambulatory Visit: Payer: BLUE CROSS/BLUE SHIELD | Admitting: Internal Medicine

## 2017-02-11 NOTE — Progress Notes (Addendum)
AIDYN, KELLIS ([id number]) Visit Report for 02/09/2017 Arrival Information Details Patient Name: Ricardo Brown, Ricardo Brown. Date of Service: 02/09/2017 9:15 AM Medical Record Number: [id number] Patient Account Number: [id number] Date of Birth/Sex: Feb 15, 1961 (56 y.o. Male) Treating RN: Ahmed Prima Primary Care Lowell Makara: [name] Other Clinician: Referring Beni Turrell: [name] Treating Hiya Point/Extender: Melburn Hake, HOYT Weeks in Treatment: 3 Visit Information History Since Last Visit All ordered tests and consults were completed: No Patient Arrived: Ambulatory Added or deleted any medications: No Arrival Time: 09:25 Any new allergies or adverse reactions: No Accompanied By: daughter Had a fall or experienced change in No Transfer Assistance: None activities of daily living that may affect Patient Identification Verified: Yes risk of falls: Secondary Verification Process Yes Signs or symptoms of abuse/neglect since last No Completed: visito Patient Requires Transmission- No Hospitalized since last visit: No Based Precautions: Has Dressing in Place as Prescribed: Yes Patient Has Alerts: Yes Pain Present Now: No Patient Alerts: DMII ABI Collins BILATERAL >220 Electronic Signature(s) Signed: 02/10/2017 10:07:34 AM By: Gretta Cool, BSN, RN, CWS, Kim RN, BSN Previous Signature: 02/09/2017 4:36:33 PM Version By: Alric Quan Entered By: Gretta Cool, BSN, RN, CWS, Kim on 02/10/2017 10:07:34 Picklesimer, Ricardo Brown ([id number]) -------------------------------------------------------------------------------- Encounter Discharge Information Details Patient Name: Ricardo Brown Date of Service: 02/09/2017 9:15 AM Medical Record Number: [id number] Patient Account Number: [id number] Date of Birth/Sex: [birth year] (56 y.o. Male) Treating RN: Cornell Barman Primary Care Bailei Buist: [name] Other Clinician: Referring Osbaldo Mark: [name] Treating Salley Boxley/Extender: Melburn Hake,  HOYT Weeks in Treatment: 3 Encounter Discharge Information Items Discharge Pain Level: 0 Discharge Condition: Stable Discharge Destination: Home Transportation: Private Auto Accompanied By: wife Schedule Follow-up Appointment: Yes Medication Reconciliation completed Yes and provided to Patient/Care Mckynlee Luse: Provided on Clinical Summary of Care: 02/09/2017 Form Type Recipient Paper Patient WM Electronic Signature(s) Signed: 02/09/2017 9:57:59 AM By: Ruthine Dose Entered By: Ruthine Dose on 02/09/2017 09:57:59 Mcquaig, Ricardo Brown ([id number]) -------------------------------------------------------------------------------- General Visit Notes Details Patient Name: Ricardo Brown Date of Service: 02/09/2017 9:15 AM Medical Record Number: [id number] Patient Account Number: [id number] Date of Birth/Sex: [birth year] (56 y.o. Male) Treating RN: Cornell Barman Primary Care Margrette Wynia: [name] Other Clinician: Referring Calee Nugent: [name] Treating Nelsie Domino/Extender: Melburn Hake, HOYT Weeks in Treatment: 3 Notes Documentation was completed under Dr. Janalyn Rouse name by accident. Cornell Barman, BSN, RN, CWS corrected name on each tab in EMR. Electronic Signature(s) Signed: 02/10/2017 10:09:32 AM By: Gretta Cool, BSN, RN, CWS, Kim RN, BSN Entered By: Gretta Cool, BSN, RN, CWS, Kim on 02/10/2017 10:09:31 Ricardo Brown, Ricardo Brown ([id number]) -------------------------------------------------------------------------------- Lower Extremity Assessment Details Patient Name: Ricardo Brown Date of Service: 02/09/2017 9:15 AM Medical Record Number: [id number] Patient Account Number: [id number] Date of Birth/Sex: 1961/07/21 (56 y.o. Male) Treating RN: Ahmed Prima Primary Care Emilee Market: [name] Other Clinician: Referring Shirlean Berman: [name] Treating Jamil Armwood/Extender: STONE III, HOYT Weeks in Treatment: 3 Vascular Assessment Pulses: Dorsalis Pedis Palpable: [Right:Yes] Posterior  Tibial Extremity colors, hair growth, and conditions: Extremity Color: [Right:Normal] Temperature of Extremity: [Right:Warm] Capillary Refill: [Right:< 3 seconds] Toe Nail Assessment Left: Right: Thick: Yes Discolored: Yes Deformed: No Improper Length and Hygiene: Yes Electronic Signature(s) Signed: 02/10/2017 10:08:06 AM By: Gretta Cool, BSN, RN, CWS, Kim RN, BSN Signed: 02/11/2017 7:50:03 AM By: Alric Quan Previous Signature: 02/09/2017 4:36:33 PM Version By: Alric Quan Entered By: Gretta Cool BSN, RN, CWS, Kim on 02/10/2017 10:08:05 Ricardo Brown, Ricardo Brown ([id number]) -------------------------------------------------------------------------------- Multi Wound Chart Details Patient Name: Ricardo Brown Date of Service: 02/09/2017 9:15 AM Medical Record Number: [id number] Patient Account Number: [id number]  Date of Birth/Sex: 05-07-1961 (56 y.o. Male) Treating RN: Carolyne Fiscal, Debi Primary Care Darrnell Mangiaracina: [name] Other Clinician: Referring Khia Dieterich: [name] Treating Davonta Stroot/Extender: STONE III, HOYT Weeks in Treatment: 3 Vital Signs Height(in): 69 Pulse(bpm): 85 Weight(lbs): 220 Blood Pressure 173/85 (mmHg): Body Mass Index(BMI): 32 Temperature(F): 98.2 Respiratory Rate 18 (breaths/min): Photos: [N/A:N/A] Wound Location: Right Foot - Plantar N/A N/A Wounding Event: Gradually Appeared N/A N/A Primary Etiology: Diabetic Wound/Ulcer of N/A N/A the Lower Extremity Comorbid History: Hypertension, Type II N/A N/A Diabetes, Neuropathy Date Acquired: 12/14/2016 N/A N/A Weeks of Treatment: 3 N/A N/A Wound Status: Open N/A N/A Measurements L x W x D 0.3x0.2x0.2 N/A N/A (cm) Area (cm) : 0.047 N/A N/A Volume (cm) : 0.009 N/A N/A % Reduction in Area: 94.20% N/A N/A % Reduction in Volume: 98.80% N/A N/A Classification: Grade 1 N/A N/A Exudate Amount: Large N/A N/A Ricardo Brown, Ricardo Brown ([id number]) Exudate Type: Serosanguineous N/A N/A Exudate Color: red,  brown N/A N/A Wound Margin: Flat and Intact N/A N/A Granulation Amount: Small (1-33%) N/A N/A Granulation Quality: Pink N/A N/A Necrotic Amount: Large (67-100%) N/A N/A Necrotic Tissue: Eschar, Adherent Slough N/A N/A Exposed Structures: Fascia: No N/A N/A Fat Layer (Subcutaneous Tissue) Exposed: No Tendon: No Muscle: No Joint: No Bone: No Epithelialization: None N/A N/A Debridement: Debridement ([id number] N/A N/A [id number]) Pre-procedure 09:40 N/A N/A Verification/Time Out Taken: Pain Control: Other N/A N/A Tissue Debrided: Fibrin/Slough, Skin, N/A N/A Callus, Subcutaneous Level: Skin/Subcutaneous N/A N/A Tissue Debridement Area (sq 0.06 N/A N/A cm): Instrument: Curette N/A N/A Bleeding: Minimum N/A N/A Hemostasis Achieved: Pressure N/A N/A Procedural Pain: 0 N/A N/A Post Procedural Pain: 0 N/A N/A Debridement Treatment Procedure was tolerated N/A N/A Response: well Post Debridement 0.6x0.4x0.5 N/A N/A Measurements L x W x D (cm) Post Debridement 0.094 N/A N/A Volume: (cm) Periwound Skin Texture: Callus: Yes N/A N/A Excoriation: No Induration: No Crepitus: No Rash: No Scarring: No Periwound Skin Maceration: No N/A N/A Moisture: Dry/Scaly: No Periwound Skin Color: Atrophie Blanche: No N/A N/A Cyanosis: No Ecchymosis: No Aigner, Ricardo Brown ([id number]) Erythema: No Hemosiderin Staining: No Mottled: No Pallor: No Rubor: No Temperature: No Abnormality N/A N/A Tenderness on Yes N/A N/A Palpation: Wound Preparation: Ulcer Cleansing: N/A N/A Rinsed/Irrigated with Saline Topical Anesthetic Applied: Other: lidocaine 4% Procedures Performed: Debridement N/A N/A Treatment Notes Wound #1 (Right, Plantar Foot) 1. Cleansed with: Clean wound with Normal Saline 2. Anesthetic Topical Lidocaine 4% cream to wound bed prior to debridement 4. Dressing Applied: Iodosorb Ointment 5. Secondary Dressing Applied Gauze and Kerlix/Conform Notes coban  lightly Electronic Signature(s) Signed: 02/10/2017 10:10:09 AM By: Gretta Cool, BSN, RN, CWS, Kim RN, BSN Previous Signature: 02/09/2017 4:36:33 PM Version By: Alric Quan Entered By: Gretta Cool BSN, RN, CWS, Kim on 02/10/2017 10:10:08 Ricardo Brown, Ricardo Brown ([id number]) -------------------------------------------------------------------------------- [place] Details Patient Name: Ricardo Brown Date of Service: 02/09/2017 9:15 AM Medical Record Number: [id number] Patient Account Number: [id number] Date of Birth/Sex: [birth year] (56 y.o. Male) Treating RN: Ahmed Prima Primary Care Kuuipo Anzaldo: [name] Other Clinician: Referring Carloyn Lahue: [name] Treating Lanika Colgate/Extender: Melburn Hake, HOYT Weeks in Treatment: 3 Active Inactive ` Orientation to the Wound Care Program Nursing Diagnoses: Knowledge deficit related to the wound healing center program Goals: Patient/caregiver will verbalize understanding of the [place] Program Date Initiated: 01/13/2017 Target Resolution Date: 03/26/2017 Goal Status: Active Interventions: Provide education on orientation to the wound center Notes: ` Peripheral Neuropathy Nursing Diagnoses: Knowledge deficit related to disease process and management of peripheral neurovascular dysfunction Goals: Patient/caregiver  will verbalize understanding of disease process and disease management Date Initiated: 01/13/2017 Target Resolution Date: 03/26/2017 Goal Status: Active Interventions: Assess signs and symptoms of neuropathy upon admission and as needed Notes: ` Wound/Skin Impairment Nursing Diagnoses: Impaired tissue integrity RAHEEL, KUNKLE ([id number]) Goals: Patient/caregiver will verbalize understanding of skin care regimen Date Initiated: 01/13/2017 Target Resolution Date: 03/26/2017 Goal Status: Active Ulcer/skin breakdown will have a volume reduction of 30% by week 4 Date Initiated:  01/13/2017 Target Resolution Date: 03/26/2017 Goal Status: Active Ulcer/skin breakdown will have a volume reduction of 50% by week 8 Date Initiated: 01/13/2017 Target Resolution Date: 03/26/2017 Goal Status: Active Ulcer/skin breakdown will have a volume reduction of 80% by week 12 Date Initiated: 01/13/2017 Target Resolution Date: 03/26/2017 Goal Status: Active Ulcer/skin breakdown will heal within 14 weeks Date Initiated: 01/13/2017 Target Resolution Date: 03/26/2017 Goal Status: Active Interventions: Assess patient/caregiver ability to obtain necessary supplies Assess patient/caregiver ability to perform ulcer/skin care regimen upon admission and as needed Assess ulceration(s) every visit Notes: Electronic Signature(s) Signed: 02/10/2017 10:09:53 AM By: Gretta Cool, BSN, RN, CWS, Kim RN, BSN Signed: 02/11/2017 7:50:03 AM By: Alric Quan Previous Signature: 02/09/2017 4:36:33 PM Version By: Alric Quan Entered By: Gretta Cool BSN, RN, CWS, Kim on 02/10/2017 10:09:52 Ricardo Brown, Ricardo Brown ([id number]) -------------------------------------------------------------------------------- Pain Assessment Details Patient Name: Ricardo Brown Date of Service: 02/09/2017 9:15 AM Medical Record Number: [id number] Patient Account Number: [id number] Date of Birth/Sex: [birth year] (56 y.o. Male) Treating RN: Ahmed Prima Primary Care Kodee Drury: [name] Other Clinician: Referring Klea Nall: [name] Treating Ilanna Deihl/Extender: Melburn Hake, HOYT Weeks in Treatment: 3 Active Problems Location of Pain Severity and Description of Pain Patient Has Paino No Site Locations With Dressing Change: No Pain Management and Medication Current Pain Management: Electronic Signature(s) Signed: 02/10/2017 10:07:42 AM By: Gretta Cool, BSN, RN, CWS, Kim RN, BSN Signed: 02/11/2017 7:50:03 AM By: Alric Quan Previous Signature: 02/09/2017 4:36:33 PM Version By: Alric Quan Entered By: Gretta Cool BSN, RN,  CWS, Kim on 02/10/2017 10:07:42 Ricardo Brown, Ricardo Brown ([id number]) -------------------------------------------------------------------------------- Patient/Caregiver Education Details Patient Name: Ricardo Brown Date of Service: 02/09/2017 9:15 AM Medical Record Number: [id number] Patient Account Number: [id number] Date of Birth/Gender: [birth year] (56 y.o. Male) Treating RN: Cornell Barman Primary Care Physician: [name] Other Clinician: Referring Physician: [name] Treating Physician/Extender: Sharalyn Ink in Treatment: 3 Education Assessment Education Provided To: Patient Education Topics Provided Wound/Skin Impairment: Handouts: Caring for Your Ulcer Methods: Demonstration Responses: State content correctly Electronic Signature(s) Signed: 02/09/2017 5:36:29 PM By: Gretta Cool, BSN, RN, CWS, Kim RN, BSN Entered By: Gretta Cool, BSN, RN, CWS, Kim on 02/09/2017 09:53:45 Ricardo Brown, Ricardo Brown ([id number]) -------------------------------------------------------------------------------- Wound Assessment Details Patient Name: Ricardo Brown Date of Service: 02/09/2017 9:15 AM Medical Record Number: [id number] Patient Account Number: [id number] Date of Birth/Sex: 1960-09-18 (56 y.o. Male) Treating RN: Carolyne Fiscal, Debi Primary Care Peter Daquila: [name] Other Clinician: Referring Darriana Deboy: [name] Treating Camya Haydon/Extender: [name] Weeks in Treatment: 3 Wound Status Wound Number: 1 Primary Diabetic Wound/Ulcer of the Lower Etiology: Extremity Wound Location: Right Foot - Plantar Wound Status: Open Wounding Event: Gradually Appeared Comorbid Hypertension, Type II Diabetes, Date Acquired: 12/14/2016 History: Neuropathy Weeks Of Treatment: 3 Clustered Wound: No Photos Wound Measurements Length: (cm) 0.3 Width: (cm) 0.2 Depth: (cm) 0.2 Area: (cm) 0.047 Volume: (cm) 0.009 % Reduction in Area: 94.2% % Reduction in Volume:  98.8% Epithelialization: None Tunneling: No Undermining: No Wound Description Classification: Grade 1 Wound Margin: Flat and Intact Exudate Amount: Large Exudate Type: Serosanguineous Exudate Color: red, brown Foul  Odor After Cleansing: No Slough/Fibrino Yes Wound Bed Granulation Amount: Small (1-33%) Exposed Structure Granulation Quality: Pink Fascia Exposed: No Necrotic Amount: Large (67-100%) Fat Layer (Subcutaneous Tissue) Exposed: No Necrotic Quality: Eschar, Adherent Slough Tendon Exposed: No Muscle Exposed: No Ricardo Brown, Ricardo Brown ([id number]) Joint Exposed: No Bone Exposed: No Periwound Skin Texture Texture Color No Abnormalities Noted: No No Abnormalities Noted: No Callus: Yes Atrophie Blanche: No Crepitus: No Cyanosis: No Excoriation: No Ecchymosis: No Induration: No Erythema: No Rash: No Hemosiderin Staining: No Scarring: No Mottled: No Pallor: No Moisture Rubor: No No Abnormalities Noted: No Dry / Scaly: No Temperature / Pain Maceration: No Temperature: No Abnormality Tenderness on Palpation: Yes Wound Preparation Ulcer Cleansing: Rinsed/Irrigated with Saline Topical Anesthetic Applied: Other: lidocaine 4%, Treatment Notes Wound #1 (Right, Plantar Foot) 1. Cleansed with: Clean wound with Normal Saline 2. Anesthetic Topical Lidocaine 4% cream to wound bed prior to debridement 4. Dressing Applied: Iodosorb Ointment 5. Secondary Dressing Applied Gauze and Kerlix/Conform Notes coban lightly Electronic Signature(s) Signed: 02/09/2017 4:36:33 PM By: Alric Quan Signed: 02/09/2017 5:36:29 PM By: Gretta Cool, BSN, RN, CWS, Kim RN, BSN Entered By: Gretta Cool, BSN, RN, CWS, Kim on 02/09/2017 09:58:44 Ricardo Brown, Ricardo Brown ([id number]) -------------------------------------------------------------------------------- [place] Details Patient Name: Ricardo Brown Date of Service: 02/09/2017 9:15 AM Medical Record Number: [id number] Patient Account  Number: [id number] Date of Birth/Sex: February 05, 1961 (56 y.o. Male) Treating RN: Carolyne Fiscal, Debi Primary Care Meldon Hanzlik: [name] Other Clinician: Referring Dason Mosley: [name] Treating Gable Odonohue/Extender: STONE III, HOYT Weeks in Treatment: 3 Vital Signs Time Taken: 09:28 Temperature (F): 98.2 Height (in): 69 Pulse (bpm): 85 Weight (lbs): 220 Respiratory Rate (breaths/min): 18 Body Mass Index (BMI): 32.5 Blood Pressure (mmHg): 173/85 Reference Range: 80 - 120 mg / dl Notes Made Hoytt, PA aware of pts BP, pt states that he has not taken his BP this morning. Electronic Signature(s) Signed: 02/10/2017 10:07:53 AM By: Gretta Cool, BSN, RN, CWS, Kim RN, BSN Previous Signature: 02/09/2017 4:36:33 PM Version By: Alric Quan Entered By: Gretta Cool BSN, RN, CWS, Kim on 02/10/2017 10:07:52

## 2017-02-15 NOTE — Progress Notes (Signed)
[name], [name] ([id number]) Visit Report for 02/09/2017 Chief Complaint Document Details Patient Name: Ricardo Brown, Ricardo Brown. Date of Service: 02/09/2017 9:15 AM Medical Record Number: [id number] Patient Account Number: [id number] Date of Birth/Sex: March 16, 1961 (56 y.o. Male) Treating RN: Ricardo Brown Primary Care Provider: [name] Other Clinician: Referring Provider: [name] Treating Provider/Extender: Melburn Hake, HOYT Weeks in Treatment: 3 Information Obtained from: Patient Chief Complaint 01/13/17; patient is here for a review of a foot ulcer on the plantar aspect of his right foot in the setting of type 2 diabetes Electronic Signature(s) Signed: 02/09/2017 7:35:55 PM By: Worthy Keeler PA-C Entered By: Worthy Keeler on 02/09/2017 09:55:42 Brown, Ricardo Renshaw ([id number]) -------------------------------------------------------------------------------- Debridement Details Patient Name: Ricardo Brown Date of Service: 02/09/2017 9:15 AM Medical Record Number: [id number] Patient Account Number: [id number] Date of Birth/Sex: [birth year] (56 y.o. Male) Treating RN: Ricardo Brown Primary Care Provider: [name] Other Clinician: Referring Provider: [name] Treating Provider/Extender: Melburn Hake, HOYT Weeks in Treatment: 3 Debridement Performed for Wound #1 Right,Plantar Foot Assessment: Performed By: Physician STONE III, HOYT E., PA-C Debridement: Debridement Severity of Tissue Pre Fat layer exposed Debridement: Pre-procedure Verification/Time Out Yes - 09:40 Taken: Start Time: 09:41 Pain Control: Other : lidocaine 4% Level: Skin/Subcutaneous Tissue Total Area Debrided (L x 0.3 (cm) x 0.2 (cm) = 0.06 (cm) W): Tissue and other Viable, Non-Viable, Callus, Fibrin/Slough, Skin, Subcutaneous material debrided: Instrument: Curette Bleeding: Minimum Hemostasis Achieved: Pressure End Time: 09:48 Procedural Pain: 0 Post Procedural Pain:  0 Response to Treatment: Procedure was tolerated well Post Debridement Measurements of Total Wound Length: (cm) 0.6 Width: (cm) 0.4 Depth: (cm) 0.5 Volume: (cm) 0.094 Character of Wound/Ulcer Post Requires Further Debridement Debridement: Severity of Tissue Post Debridement: Fat layer exposed Post Procedure Diagnosis Same as Pre-procedure Electronic Signature(s) Signed: 02/09/2017 5:36:29 PM By: Gretta Cool, BSN, RN, CWS, Kim RN, BSN Signed: 02/09/2017 7:35:55 PM By: Irean Hong Brown, Ricardo Renshaw ([id number]) Entered By: Gretta Cool, BSN, RN, CWS, Kim on 02/09/2017 09:50:22 Brown, Ricardo Renshaw ([id number]) -------------------------------------------------------------------------------- HPI Details Patient Name: Ricardo Brown Date of Service: 02/09/2017 9:15 AM Medical Record Number: [id number] Patient Account Number: [id number] Date of Birth/Sex: 1961-07-30 (56 y.o. Male) Treating RN: Ricardo Brown Primary Care Provider: [name] Other Clinician: Referring Provider: [name] Treating Provider/Extender: Melburn Hake, HOYT Weeks in Treatment: 3 History of Present Illness HPI Description: 01/13/17; this is a 56 year old man who is type II diabetic. Over the last month he noted callus and wound development on the plantar aspect of his right foot with the wound center today roughly the second metatarsal head. He has been using Neosporin and a dry dressing. The patient relates some history of numbness in the foot. He does not have known vascular disease or claudication-type symptoms. He is a nonsmoker. He is unaware of his hemoglobin A1c but has an appointment with his primary physician. Primary physician mentioned toe diabetic shoes although he does not have them yet. He states that the shoes he wears to work were wearing down in the forefoot and he thinks he puts to much pressure on this area. He gives a history of an area on the plantar left foot about 6 months ago  which sounds a lot like a blister that responded well to offloading. A1c in this clinic is noncompressible bilaterally 01/20/17; patient arrives today with an improved-looking wound. Measurements at 5 x 0.5 x 0.6 units surface of this appears to be healthy. Rolled edges of skin and subcutaneous tissue needed debridement however  in general everything looks better. Plain x-ray of the foot was a bit indeterminant but did not show obvious bone destruction. As the patient is improving I'll forego an MRI for the moment 01/28/17; patient's wound is smaller. Still some rolled edges around the wound but nothing looked like it required debridement. Continuing to use silver alginate. Patient is going to the beach next week we will see him back in 2 weeks 02/09/17 on evaluation today patient appears to have a significant amount of calloused surrounding the wound on his forefoot. He tells me upon questioning that he does sometimes go barefoot around his house which I think could be part of the issue in this regard. Nonetheless his wound has been improving in size since initial evaluation. He has no discomfort and he also tells me that his blood sugars have been running in the 200 range and he even tells me that his primary care provider attempted to add insulin to his current regimen unfortunately this will. We discussed briefly he may need to see an endocrinologist. Electronic Signature(s) Signed: 02/09/2017 7:35:55 PM By: Worthy Keeler PA-C Entered By: Worthy Keeler on 02/09/2017 09:56:18 Brown, Ricardo Renshaw ([id number]) -------------------------------------------------------------------------------- Physical Exam Details Patient Name: Ricardo Brown Date of Service: 02/09/2017 9:15 AM Medical Record Number: [id number] Patient Account Number: [id number] Date of Birth/Sex: 20-Mar-1961 (56 y.o. Male) Treating RN: Ricardo Brown Primary Care Provider: [name] Other Clinician: Referring  Provider: [name] Treating Provider/Extender: STONE III, HOYT Weeks in Treatment: 3 Constitutional Well-nourished and well-hydrated in no acute distress. Respiratory normal breathing without difficulty. Psychiatric this patient is able to make decisions and demonstrates good insight into disease process. Alert and Oriented x 3. pleasant and cooperative. Notes On evaluation patient has significant callus surrounding the wound opening which required debridement today. I therefore both debride the wound as well as peer surrounding the wound with excellent results and this was sneakily improved post debridement. Electronic Signature(s) Signed: 02/09/2017 7:35:55 PM By: Worthy Keeler PA-C Entered By: Worthy Keeler on 02/09/2017 09:57:59 Prichett, Ricardo Renshaw ([id number]) -------------------------------------------------------------------------------- Physician Orders Details Patient Name: Ricardo Brown Date of Service: 02/09/2017 9:15 AM Medical Record Number: [id number] Patient Account Number: [id number] Date of Birth/Sex: 02-Jun-1961 (56 y.o. Male) Treating RN: Ricardo Brown Primary Care Provider: [name] Other Clinician: Referring Provider: [name] Treating Provider/Extender: Melburn Hake, HOYT Weeks in Treatment: 3 Verbal / Phone Orders: No Diagnosis Coding ICD-10 Coding Code Description E11.621 Type 2 diabetes mellitus with foot ulcer L97.513 Non-pressure chronic ulcer of other part of right foot with necrosis of muscle E11.42 Type 2 diabetes mellitus with diabetic polyneuropathy Wound Cleansing Wound #1 Right,Plantar Foot o Clean wound with Normal Saline. o Cleanse wound with mild soap and water Anesthetic Wound #1 Right,Plantar Foot o Topical Lidocaine 4% cream applied to wound bed prior to debridement Primary Wound Dressing Wound #1 Right,Plantar Foot o Iodosorb Ointment Secondary Dressing Wound #1 Right,Plantar Foot o Gauze and  Kerlix/Conform Dressing Change Frequency Wound #1 Right,Plantar Foot o Change dressing every other day. Follow-up Appointments Wound #1 Right,Plantar Foot o Return Appointment in 1 week. Edema Control Wound #1 Right,Plantar Foot o Elevate legs to the level of the heart and pump ankles as often as possible Blust, Ricardo Renshaw ([id number]) Off-Loading Wound #1 Right,Plantar Foot o Other: - front off-loader Additional Orders / Instructions Wound #1 Right,Plantar Foot o Increase protein intake. Electronic Signature(s) Signed: 02/09/2017 10:03:54 AM By: Gretta Cool, BSN, RN, CWS, Kim RN, BSN Signed: 02/09/2017 7:35:55 PM By:  Melburn Hake, Hoyt PA-C Entered By: Gretta Cool, BSN, RN, CWS, Kim on 02/09/2017 10:03:53 Lenker, Ricardo Renshaw ([id number]) -------------------------------------------------------------------------------- Problem List Details Patient Name: Ricardo Brown Date of Service: 02/09/2017 9:15 AM Medical Record Number: [id number] Patient Account Number: [id number] Date of Birth/Sex: 17-Nov-1960 (56 y.o. Male) Treating RN: Ricardo Brown Primary Care Provider: [name] Other Clinician: Referring Provider: [name] Treating Provider/Extender: Melburn Hake, HOYT Weeks in Treatment: 3 Active Problems ICD-10 Encounter Code Description Active Date Diagnosis E11.621 Type 2 diabetes mellitus with foot ulcer 01/13/2017 Yes L97.513 Non-pressure chronic ulcer of other part of right foot with 01/13/2017 Yes necrosis of muscle E11.42 Type 2 diabetes mellitus with diabetic polyneuropathy 01/13/2017 Yes Inactive Problems Resolved Problems Electronic Signature(s) Signed: 02/10/2017 10:10:39 AM By: Gretta Cool, BSN, RN, CWS, Kim RN, BSN Signed: 02/11/2017 10:10:39 AM By: Worthy Keeler PA-C Previous Signature: 02/09/2017 7:35:55 PM Version By: Worthy Keeler PA-C Entered By: Gretta Cool, BSN, RN, CWS, Kim on 02/10/2017 10:10:38 Bouse, Ricardo Renshaw  ([id number]) -------------------------------------------------------------------------------- Progress Note Details Patient Name: Ricardo Brown Date of Service: 02/09/2017 9:15 AM Medical Record Number: [id number] Patient Account Number: [id number] Date of Birth/Sex: [birth year] (56 y.o. Male) Treating RN: Ricardo Brown Primary Care Provider: [name] Other Clinician: Referring Provider: [name] Treating Provider/Extender: Melburn Hake, HOYT Weeks in Treatment: 3 Subjective Chief Complaint Information obtained from Patient 01/13/17; patient is here for a review of a foot ulcer on the plantar aspect of his right foot in the setting of type 2 diabetes History of Present Illness (HPI) 01/13/17; this is a 56 year old man who is type II diabetic. Over the last month he noted callus and wound development on the plantar aspect of his right foot with the wound center today roughly the second metatarsal head. He has been using Neosporin and a dry dressing. The patient relates some history of numbness in the foot. He does not have known vascular disease or claudication-type symptoms. He is a nonsmoker. He is unaware of his hemoglobin A1c but has an appointment with his primary physician. Primary physician mentioned toe diabetic shoes although he does not have them yet. He states that the shoes he wears to work were wearing down in the forefoot and he thinks he puts to much pressure on this area. He gives a history of an area on the plantar left foot about 6 months ago which sounds a lot like a blister that responded well to offloading. A1c in this clinic is noncompressible bilaterally 01/20/17; patient arrives today with an improved-looking wound. Measurements at 5 x 0.5 x 0.6 units surface of this appears to be healthy. Rolled edges of skin and subcutaneous tissue needed debridement however in general everything looks better. Plain x-ray of the foot was a bit indeterminant but did not  show obvious bone destruction. As the patient is improving I'll forego an MRI for the moment 01/28/17; patient's wound is smaller. Still some rolled edges around the wound but nothing looked like it required debridement. Continuing to use silver alginate. Patient is going to the beach next week we will see him back in 2 weeks 02/09/17 on evaluation today patient appears to have a significant amount of calloused surrounding the wound on his forefoot. He tells me upon questioning that he does sometimes go barefoot around his house which I think could be part of the issue in this regard. Nonetheless his wound has been improving in size since initial evaluation. He has no discomfort and he also tells me that his blood sugars have  been running in the 200 range and he even tells me that his primary care provider attempted to add insulin to his current regimen unfortunately this will. We discussed briefly he may need to see an endocrinologist. Ricardo Brown ([id number]) Objective Constitutional Well-nourished and well-hydrated in no acute distress. Vitals Time Taken: 9:28 AM, Height: 69 in, Weight: 220 lbs, BMI: 32.5, Temperature: 98.2 F, Pulse: 85 bpm, Respiratory Rate: 18 breaths/min, Blood Pressure: 173/85 mmHg. General Notes: Made Hoytt, PA aware of pts BP, pt states that he has not taken his BP this morning. Respiratory normal breathing without difficulty. Psychiatric this patient is able to make decisions and demonstrates good insight into disease process. Alert and Oriented x 3. pleasant and cooperative. General Notes: On evaluation patient has significant callus surrounding the wound opening which required debridement today. I therefore both debride the wound as well as peer surrounding the wound with excellent results and this was sneakily improved post debridement. Integumentary (Hair, Skin) Wound #1 status is Open. Original cause of wound was Gradually Appeared. The wound is  located on the [place]. The wound measures 0.3cm length x 0.2cm width x 0.2cm depth; 0.047cm^2 area and 0.009cm^3 volume. There is no tunneling or undermining noted. There is a large amount of serosanguineous drainage noted. The wound margin is flat and intact. There is small (1-33%) pink granulation within the wound bed. There is a large (67-100%) amount of necrotic tissue within the wound bed including Eschar and Adherent Slough. The periwound skin appearance exhibited: Callus. The periwound skin appearance did not exhibit: Crepitus, Excoriation, Induration, Rash, Scarring, Dry/Scaly, Maceration, Atrophie Blanche, Cyanosis, Ecchymosis, Hemosiderin Staining, Mottled, Pallor, Rubor, Erythema. Periwound temperature was noted as No Abnormality. The periwound has tenderness on palpation. Assessment Active Problems ICD-10 E11.621 - Type 2 diabetes mellitus with foot ulcer L97.513 - Non-pressure chronic ulcer of other part of right foot with necrosis of muscle E11.42 - Type 2 diabetes mellitus with diabetic polyneuropathy Townsend, Ricardo Renshaw ([id number]) Procedures Wound #1 Pre-procedure diagnosis of Wound #1 is a Diabetic Wound/Ulcer of the Lower Extremity located on the Right,Plantar Foot .Severity of Tissue Pre Debridement is: Fat layer exposed. There was a Skin/Subcutaneous Tissue Debridement ([id number]) debridement with total area of 0.06 sq cm performed by STONE III, HOYT E., PA-C. with the following instrument(s): Curette to remove Viable and Non-Viable tissue/material including Fibrin/Slough, Skin, Callus, and Subcutaneous after achieving pain control using Other (lidocaine 4%). A time out was conducted at 09:40, prior to the start of the procedure. A Minimum amount of bleeding was controlled with Pressure. The procedure was tolerated well with a pain level of 0 throughout and a pain level of 0 following the procedure. Post Debridement Measurements: 0.6cm length x 0.4cm  width x 0.5cm depth; 0.094cm^3 volume. Character of Wound/Ulcer Post Debridement requires further debridement. Severity of Tissue Post Debridement is: Fat layer exposed. Post procedure Diagnosis Wound #1: Same as Pre-Procedure Plan Wound Cleansing: Wound #1 Right,Plantar Foot: Clean wound with Normal Saline. Cleanse wound with mild soap and water Anesthetic: Wound #1 Right,Plantar Foot: Topical Lidocaine 4% cream applied to wound bed prior to debridement Primary Wound Dressing: Wound #1 Right,Plantar Foot: Iodosorb Ointment Secondary Dressing: Wound #1 Right,Plantar Foot: Gauze and Kerlix/Conform Dressing Change Frequency: Wound #1 Right,Plantar Foot: Change dressing every other day. Follow-up Appointments: Wound #1 Right,Plantar Foot: Return Appointment in 1 week. Edema Control: Wound #1 Right,Plantar Foot: Elevate legs to the level of the heart and pump ankles as often as possible Off-Loading: Wound #1 Right,Plantar  Foot: AWS, SHERE ([id number]) Other: - front off-loader Additional Orders / Instructions: Wound #1 Right,Plantar Foot: Increase protein intake. I'm going to recommend that we switch to the Iodosorb Ointment for this patient over the next week. He is not allergic to iodine we will see how this does. I'll so recommended that he needs to get his blood sugars under much better control compared to what they are currently. Running around 200 as they have been is going to impair his healing. He voiced understanding. If his primary is having difficulty also suggested he may need to see and endocrinologist to help get this under better control. His wife was present during evaluation today. Otherwise we will see him for reevaluation in one week. Electronic Signature(s) Signed: 02/10/2017 10:11:16 AM By: Gretta Cool, BSN, RN, CWS, Kim RN, BSN Signed: 02/11/2017 10:10:39 AM By: Worthy Keeler PA-C Previous Signature: 02/09/2017 7:35:55 PM Version By: Worthy Keeler  PA-C Entered By: Gretta Cool BSN, RN, CWS, Kim on 02/10/2017 10:11:16 Carmer, Ricardo Renshaw ([id number]) -------------------------------------------------------------------------------- SuperBill Details Patient Name: Ricardo Brown Date of Service: 02/09/2017 Medical Record Number: [id number] Patient Account Number: [id number] Date of Birth/Sex: 08-Mar-1961 (56 y.o. Male) Treating RN: Ricardo Brown Primary Care Provider: [name] Other Clinician: Referring Provider: [name] Treating Provider/Extender: Melburn Hake, HOYT Weeks in Treatment: 3 Diagnosis Coding ICD-10 Codes Code Description E11.621 Type 2 diabetes mellitus with foot ulcer L97.513 Non-pressure chronic ulcer of other part of right foot with necrosis of muscle E11.42 Type 2 diabetes mellitus with diabetic polyneuropathy Facility Procedures CPT4: Description Modifier Quantity Code [id number] 11042 - DEB SUBQ TISSUE 20 SQ CM/< 1 ICD-10 Description Diagnosis L97.513 Non-pressure chronic ulcer of other part of right foot with necrosis of muscle Physician Procedures CPT4: Description Modifier Quantity Code [id number] 67591 - WC PHYS SUBQ TISS 20 SQ CM 1 ICD-10 Description Diagnosis L97.513 Non-pressure chronic ulcer of other part of right foot with necrosis of muscle Electronic Signature(s) Signed: 02/09/2017 7:35:55 PM By: Worthy Keeler PA-C Entered By: Worthy Keeler on 02/09/2017 14:06:34

## 2017-02-16 ENCOUNTER — Encounter: Payer: BLUE CROSS/BLUE SHIELD | Admitting: Internal Medicine

## 2017-02-16 DIAGNOSIS — E1142 Type 2 diabetes mellitus with diabetic polyneuropathy: Secondary | ICD-10-CM | POA: Diagnosis not present

## 2017-02-16 DIAGNOSIS — E11621 Type 2 diabetes mellitus with foot ulcer: Secondary | ICD-10-CM | POA: Diagnosis not present

## 2017-02-16 DIAGNOSIS — L97512 Non-pressure chronic ulcer of other part of right foot with fat layer exposed: Secondary | ICD-10-CM | POA: Diagnosis not present

## 2017-02-16 DIAGNOSIS — L97513 Non-pressure chronic ulcer of other part of right foot with necrosis of muscle: Secondary | ICD-10-CM | POA: Diagnosis not present

## 2017-02-16 DIAGNOSIS — I10 Essential (primary) hypertension: Secondary | ICD-10-CM | POA: Diagnosis not present

## 2017-02-16 DIAGNOSIS — Z794 Long term (current) use of insulin: Secondary | ICD-10-CM | POA: Diagnosis not present

## 2017-02-18 NOTE — Progress Notes (Signed)
Ricardo Brown, Ricardo Brown ([id number]) Visit Report for 02/16/2017 Chief Complaint Document Details Ricardo Brown Date of Service: 02/16/2017 1:30 PM Patient Name: V. Patient Account Number: [id number] Medical Record Treating RN: Ricardo Brown [id number] Number: Other Clinician: Date of Birth/Sex: 21-Jan-1961 (56 y.o. Male) Treating Ricardo Brown Primary Care Provider: [name] Provider/Extender: G Referring Provider: Princess Perna in Treatment: 4 Information Obtained from: Patient Chief Complaint 01/13/17; patient is here for a review of a foot ulcer on the plantar aspect of his right foot in the setting of type 2 diabetes Electronic Signature(s) Signed: 02/16/2017 5:59:51 PM By: Ricardo Ham MD Entered By: Ricardo Brown on 02/16/2017 17:19:23 Ricardo Brown ([id number]) -------------------------------------------------------------------------------- Debridement Details Ricardo Brown Date of Service: 02/16/2017 1:30 PM Patient Name: V. Patient Account Number: [id number] Medical Record Treating RN: Ricardo Brown [id number] Number: Other Clinician: Date of Birth/Sex: September 16, 1960 (56 y.o. Male) Treating Ricardo Brown Primary Care Provider: [name] Provider/Extender: G Referring Provider: Princess Perna in Treatment: 4 Debridement Performed for Wound #1 Right,Plantar Foot Assessment: Performed By: Physician [name], MD Debridement: Debridement Severity of Tissue Pre Fat layer exposed Debridement: Pre-procedure Verification/Time Out Yes - 14:04 Taken: Start Time: 14:04 Pain Control: Lidocaine 4% Topical Solution Level: Skin/Subcutaneous Tissue Total Area Debrided (L x 0.3 (cm) x 0.1 (cm) = 0.03 (cm) W): Tissue and other Viable, Non-Viable, Fibrin/Slough, Skin, Subcutaneous material debrided: Instrument: Curette Bleeding: Minimum Hemostasis Achieved: Pressure End Time: 14:06 Procedural Pain: 0 Post Procedural Pain:  0 Response to Treatment: Procedure was tolerated well Post Debridement Measurements of Total Wound Length: (cm) 0.3 Width: (cm) 0.2 Depth: (cm) 0.3 Volume: (cm) 0.014 Character of Wound/Ulcer Post Improved Debridement: Severity of Tissue Post Debridement: Fat layer exposed Post Procedure Diagnosis Same as Pre-procedure Electronic Signature(s) Ricardo Brown ([id number]) Signed: 02/16/2017 5:46:25 PM By: Ricardo Brown Signed: 02/16/2017 5:59:51 PM By: Ricardo Ham MD Entered By: Ricardo Brown on 02/16/2017 17:19:17 Ricardo Brown ([id number]) -------------------------------------------------------------------------------- HPI Details Ricardo Brown Date of Service: 02/16/2017 1:30 PM Patient Name: V. Patient Account Number: [id number] Medical Record Treating RN: Ricardo Brown [id number] Number: Other Clinician: Date of Birth/Sex: 07/31/1961 (56 y.o. Male) Treating Ricardo Brown Primary Care Provider: [name] Provider/Extender: G Referring Provider: [name] Weeks in Treatment: 4 History of Present Illness HPI Description: 01/13/17; this is a 56 year old man who is type II diabetic. Over the last month he noted callus and wound development on the plantar aspect of his right foot with the wound center today roughly the second metatarsal head. He has been using Neosporin and a dry dressing. The patient relates some history of numbness in the foot. He does not have known vascular disease or claudication-type symptoms. He is a nonsmoker. He is unaware of his hemoglobin A1c but has an appointment with his primary physician. Primary physician mentioned toe diabetic shoes although he does not have them yet. He states that the shoes he wears to work were wearing down in the forefoot and he thinks he puts to much pressure on this area. He gives a history of an area on the plantar left foot about 6 months ago which sounds a lot like a blister that  responded well to offloading. A1c in this clinic is noncompressible bilaterally 01/20/17; patient arrives today with an improved-looking wound. Measurements at 5 x 0.5 x 0.6 units surface of this appears to be healthy. Rolled edges of skin and subcutaneous tissue needed debridement however in general everything looks better. Plain x-ray of the foot was a bit indeterminant but  did not show obvious bone destruction. As the patient is improving I'll forego an MRI for the moment 01/28/17; patient's wound is smaller. Still some rolled edges around the wound but nothing looked like it required debridement. Continuing to use silver alginate. Patient is going to the beach next week we will see him back in 2 weeks 02/09/17 on evaluation today patient appears to have a significant amount of calloused surrounding the wound on his forefoot. He tells me upon questioning that he does sometimes go barefoot around his house which I think could be part of the issue in this regard. Nonetheless his wound has been improving in size since initial evaluation. He has no discomfort and he also tells me that his blood sugars have been running in the 200 range and he even tells me that his primary care provider attempted to add insulin to his current regimen unfortunately this will. We discussed briefly he may need to see an endocrinologist. 02/16/17; again a continued amount of thick callus around the wound which requires debridement. Even though he is wearing a Darco forefoot off loader I don't think we are offloading this enough. I discussed the total contact cast with him. Depth of this wound is 0.6 cm. I don't think this is a small enough wound probed Iodosorb ointment Electronic Signature(s) Signed: 02/16/2017 5:59:51 PM By: Ricardo Ham MD Entered By: Ricardo Brown on 02/16/2017 17:20:14 Ricardo Brown ([id number]) -------------------------------------------------------------------------------- Physical  Exam Details Ricardo Brown Date of Service: 02/16/2017 1:30 PM Patient Name: V. Patient Account Number: [id number] Medical Record Treating RN: Ricardo Brown [id number] Number: Other Clinician: Date of Birth/Sex: 18-Apr-1961 (56 y.o. Male) Treating Seniah Lawrence, [place] Primary Care Provider: [name] Provider/Extender: G Referring Provider: [name] Weeks in Treatment: 4 Constitutional Sitting or standing Blood Pressure is within target range for patient.. Pulse regular and within target range for patient.[name] Respirations regular, non-labored and within target range.. Temperature is normal and within the target range for the patient.[name] appears in no distress. Notes Wound exam; there is significant callus around the wound orifice nonviable skin and subcutaneous tissue. All of this removed with a #3 curet. Necrotic material from the wound bed already removed. Post debridement depth of this is 0.6 cm. This is better than when he first came in at 0.9 cm. Electronic Signature(s) Signed: 02/16/2017 5:59:51 PM By: Ricardo Ham MD Entered By: Ricardo Brown on 02/16/2017 17:21:24 Esper, Caprice Brown ([id number]) -------------------------------------------------------------------------------- Physician Orders Details Ricardo Brown Date of Service: 02/16/2017 1:30 PM Patient Name: V. Patient Account Number: [id number] Medical Record Treating RN: Ricardo Brown [id number] Number: Other Clinician: Date of Birth/Sex: [birth year] (56 y.o. Male) Treating Syanna Remmert, [place] Primary Care Provider: [name] Provider/Extender: G Referring Provider: Princess Perna in Treatment: 4 Verbal / Phone Orders: No Diagnosis Coding Wound Cleansing Wound #1 Right,Plantar Foot o Clean wound with Normal Saline. o Cleanse wound with mild soap and water Anesthetic Wound #1 Right,Plantar Foot o Topical Lidocaine 4% cream applied to wound bed prior to debridement Primary Wound  Dressing Wound #1 Right,Plantar Foot o Prisma Ag Secondary Dressing Wound #1 Right,Plantar Foot o Gauze and Kerlix/Conform Dressing Change Frequency Wound #1 Right,Plantar Foot o Change dressing every other day. Follow-up Appointments Wound #1 Right,Plantar Foot o Return Appointment in 1 week. Edema Control Wound #1 Right,Plantar Foot o Elevate legs to the level of the heart and pump ankles as often as possible Off-Loading Wound #1 Right,Plantar Foot o Other: - front off-loader Dickard, Caprice Brown ([id number]) Additional Orders / Instructions Wound #  1 Right,Plantar Foot o Increase protein intake. Electronic Signature(s) Signed: 02/16/2017 5:46:25 PM By: Ricardo Brown Signed: 02/16/2017 5:59:51 PM By: Ricardo Ham MD Entered By: Ricardo Brown on 02/16/2017 14:06:17 Bence, Caprice Brown ([id number]) -------------------------------------------------------------------------------- Problem List Details Ricardo Brown Date of Service: 02/16/2017 1:30 PM Patient Name: V. Patient Account Number: [id number] Medical Record Treating RN: Ricardo Brown [id number] Number: Other Clinician: Date of Birth/Sex: Aug 19, 1960 (55 y.o. 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 Patient Account Number: [id number] Medical Record Treating RN: Ricardo Brown [id number] Number: Other Clinician: Date of Birth/Sex: October 03, 1960 (55 y.o. Male) Treating Ricardo Brown Primary Care Provider: [name] Provider/Extender: G Referring Provider: Princess Perna in Treatment: 4 Subjective Chief Complaint Information obtained from Patient 01/13/17; patient is here for a review of a foot ulcer on the plantar aspect of his right foot in the setting of type 2 diabetes History of Present Illness (HPI) 01/13/17; this is a 56 year old man who is type II diabetic. Over the last month he noted callus and wound development on the plantar aspect of his right foot with the wound center today roughly the second metatarsal head. He has been using Neosporin and a dry dressing. The patient relates some history of numbness in the foot. He does not have known vascular disease or claudication-type symptoms. He is a nonsmoker. He is unaware of his hemoglobin A1c but has an appointment with his primary physician. Primary physician mentioned toe diabetic shoes although he does not have them yet. He states that the shoes he wears to work were wearing down in the forefoot and he thinks he puts to much pressure on this area. He gives a history of an area on the plantar left foot about 6 months ago which sounds a lot like a blister that responded well to offloading. A1c in this clinic is noncompressible bilaterally 01/20/17; patient arrives today with an improved-looking wound. Measurements at 5 x 0.5 x 0.6 units surface of this appears to be healthy. Rolled edges of skin and subcutaneous tissue needed debridement however in general everything looks better. Plain x-ray of the foot was a bit indeterminant but did  not show obvious bone destruction. As the patient is improving I'll forego an MRI for the moment 01/28/17; patient's wound is smaller. Still some rolled edges around the wound but nothing looked like it required debridement. Continuing to use silver alginate. Patient is going to the beach next week we will see him back in 2 weeks 02/09/17 on evaluation today patient appears to have a significant amount of calloused surrounding the wound on his forefoot. He tells me upon questioning that he does sometimes go barefoot around his house which I think could be part of the issue in this regard. Nonetheless his wound has been improving in size since initial evaluation. He has no discomfort and he also tells me that his blood sugars have been running in the 200 range and he even tells  me that his primary care provider attempted to add insulin to his current regimen unfortunately this will. We discussed briefly he may need to see an endocrinologist. 02/16/17; again a continued amount of thick callus around the wound which requires debridement. Even though he is wearing a Darco forefoot off loader I don't think we are offloading this enough. I discussed the total contact cast with him. Depth of this wound is 0.6 cm. I don't think this is a small enough wound probed Iodosorb ointment Kees, Caprice Brown ([id number]) Objective Constitutional Sitting or standing Blood Pressure is within target range for patient.. Pulse regular and within target range for patient.[name] Respirations regular, non-labored and within target range.. Temperature is normal and within the target range for the patient.[name] appears in no distress. Vitals Time Taken: 1:31 PM, Brown: 69 in, Weight: 220 lbs, BMI: 32.5, Temperature: 98.2 F, Pulse: 70 bpm, Respiratory Rate: 18 breaths/min, Blood Pressure: 138/64 mmHg. General Notes: Wound exam; there is significant callus around the wound orifice nonviable skin and subcutaneous tissue. All of  this removed with a #3 curet. Necrotic material from the wound bed already removed. Post debridement depth of this is 0.6 cm. This is better than when he first came in at 0.9 cm. Integumentary (Hair, Skin) Wound #1 status is Open. Original cause of wound was Gradually Appeared. The wound is located on the Helix. The wound measures 0.3cm length x 0.1cm width x 0.2cm depth; 0.024cm^2 area and 0.005cm^3 volume. There is no tunneling or undermining noted. There is a large amount of serosanguineous drainage noted. The wound margin is flat and intact. There is large (67-100%) pink granulation within the wound bed. There is a small (1-33%) amount of necrotic tissue within the wound bed including Adherent Slough. The periwound skin appearance exhibited: Callus. The periwound skin appearance did not exhibit: Crepitus, Excoriation, Induration, Rash, Scarring, Dry/Scaly, Maceration, Atrophie Blanche, Cyanosis, Ecchymosis, Hemosiderin Staining, Mottled, Pallor, Rubor, Erythema. Periwound temperature was noted as No Abnormality. The periwound has tenderness on palpation. Assessment Active Problems ICD-10 E11.621 - Type 2 diabetes mellitus with foot ulcer L97.513 - Non-pressure chronic ulcer of other part of right foot with necrosis of muscle E11.42 - Type 2 diabetes mellitus with diabetic polyneuropathy Geissinger, Caprice Brown ([id number]) Procedures Wound #1 Pre-procedure diagnosis of Wound #1 is a Diabetic Wound/Ulcer of the Lower Extremity located on the Right,Plantar Foot .Severity of Tissue Pre Debridement is: Fat layer exposed. There was a Skin/Subcutaneous Tissue Debridement ([id number]) debridement with total area of 0.03 sq cm performed by [name], MD. with the following instrument(s): Curette to remove Viable and Non-Viable tissue/material including Fibrin/Slough, Skin, and Subcutaneous after achieving pain control using Lidocaine 4% Topical Solution. A time out was  conducted at 14:04, prior to the start of the procedure. A Minimum amount of bleeding was controlled with Pressure. The procedure was tolerated well with a pain level of 0 throughout and a pain level of 0 following the procedure. Post Debridement Measurements: 0.3cm length x 0.2cm width x 0.3cm depth; 0.014cm^3 volume. Character of Wound/Ulcer Post Debridement is improved. Severity of Tissue Post Debridement is: Fat layer exposed. Post procedure Diagnosis Wound #1: Same as Pre-Procedure Plan Wound Cleansing: Wound #1 Right,Plantar Foot: Clean wound with Normal Saline. Cleanse wound with mild soap and water Anesthetic: Wound #1 Right,Plantar Foot: Topical Lidocaine 4% cream applied to wound bed prior to debridement Primary Wound Dressing: Wound #1 Right,Plantar Foot: Prisma Ag Secondary Dressing: Wound #1 Right,Plantar Foot: Gauze and Kerlix/Conform Dressing Change Frequency:  Wound #1 Right,Plantar Foot: Change dressing every other day. Follow-up Appointments: Wound #1 Right,Plantar Foot: Return Appointment in 1 week. Edema Control: Wound #1 Right,Plantar Foot: Elevate legs to the level of the heart and pump ankles as often as possible Off-Loading: Wound #1 Right,Plantar Foot: Steil, Caprice Brown ([id number]) Other: - front off-loader Additional Orders / Instructions: Wound #1 Right,Plantar Foot: Increase protein intake. #1 went back to Prisma hydrogel #2 he is likely going to require a total contact cast #3 Oasis single layer might be an option here as well Engineer, maintenance) Signed: 02/16/2017 5:59:51 PM By: Ricardo Ham MD Entered By: Ricardo Brown on 02/16/2017 17:22:18 Maden, Caprice Brown ([id number]) -------------------------------------------------------------------------------- SuperBill Details Hallisey, [name] Date of Service: 02/16/2017 Patient Name: V. Patient Account Number: [id number] Medical Record Treating RN: Ricardo Brown [id number] Number: Other Clinician: Date of Birth/Sex: 29-Dec-1960 (56 y.o. 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

## 2017-02-18 NOTE — Progress Notes (Signed)
Ricardo Brown, Ricardo Brown ([id number]) Visit Report for 02/16/2017 Arrival Information Details Patient Name: Ricardo Brown, Ricardo Brown. Date of Service: 02/16/2017 1:30 PM Medical Record Number: [id number] Patient Account Number: [id number] Date of Birth/Sex: [birth year] (56 y.o. Male) Treating RN: Montey Hora Primary Care Nolawi Kanady: [name] Other Clinician: Referring Manish Ruggiero: [name] Treating Tashonda Pinkus/Extender: Tito Dine in Treatment: 4 Visit Information History Since Last Visit Added or deleted any medications: No Patient Arrived: Ambulatory Any new allergies or adverse reactions: No Arrival Time: 13:27 Had a fall or experienced change in No Accompanied By: dtr activities of daily living that may affect Transfer Assistance: None risk of falls: Patient Identification Verified: Yes Signs or symptoms of abuse/neglect since last No Secondary Verification Process Yes visito Completed: Hospitalized since last visit: No Patient Requires Transmission- No Has Dressing in Place as Prescribed: Yes Based Precautions: Has Footwear/Offloading in Place as Yes Patient Has Alerts: Yes Prescribed: Patient Alerts: DMII Right: Wedge ABI Nectar Shoe BILATERAL >220 Pain Present Now: No Electronic Signature(s) Signed: 02/16/2017 5:46:25 PM By: Montey Hora Entered By: Montey Hora on 02/16/2017 13:30:34 Espinoza, [name] ([id number]) -------------------------------------------------------------------------------- Encounter Discharge Information Details Patient Name: Ricardo Brown Date of Service: 02/16/2017 1:30 PM Medical Record Number: [id number] Patient Account Number: [id number] Date of Birth/Sex: 1961-02-24 (56 y.o. Male) Treating RN: Montey Hora Primary Care Bonnie Roig: [name] Other Clinician: Referring Marnette Perkins: [name] Treating Vaunda Gutterman/Extender: Tito Dine in Treatment: 4 Encounter Discharge Information Items Discharge  Pain Level: 0 Discharge Condition: Stable Ambulatory Status: Ambulatory Discharge Destination: Home Transportation: Private Auto Accompanied By: dtr Schedule Follow-up Appointment: Yes Medication Reconciliation completed and provided to Patient/Care No Keshonda Monsour: Provided on Clinical Summary of Care: 02/16/2017 Form Type Recipient Paper Patient WM Electronic Signature(s) Signed: 02/16/2017 2:16:42 PM By: Ruthine Dose Entered By: Ruthine Dose on 02/16/2017 14:16:42 Klaiber, [name] ([id number]) -------------------------------------------------------------------------------- Lower Extremity Assessment Details Patient Name: Ricardo Brown Date of Service: 02/16/2017 1:30 PM Medical Record Number: [id number] Patient Account Number: [id number] Date of Birth/Sex: [birth year] (56 y.o. Male) Treating RN: Montey Hora Primary Care Cortavius Montesinos: [name] Other Clinician: Referring Emslee Lopezmartinez: [name] Treating Linna Thebeau/Extender: [name] Weeks in Treatment: 4 Vascular Assessment Pulses: Dorsalis Pedis Palpable: [Right:Yes] Posterior Tibial Extremity colors, hair growth, and conditions: Extremity Color: [Right:Normal] Hair Growth on Extremity: [Right:Yes] Temperature of Extremity: [Right:Warm] Capillary Refill: [Right:< 3 seconds] Electronic Signature(s) Signed: 02/16/2017 1:36:58 PM By: Montey Hora Entered By: Montey Hora on 02/16/2017 13:36:58 Niziolek, [name] ([id number]) -------------------------------------------------------------------------------- Multi Wound Chart Details Patient Name: Ricardo Brown Date of Service: 02/16/2017 1:30 PM Medical Record Number: [id number] Patient Account Number: [id number] Date of Birth/Sex: 01-17-1961 (56 y.o. Male) Treating RN: Montey Hora Primary Care Leone Putman: [name] Other Clinician: Referring Donda Friedli: [name] Treating Damier Disano/Extender: [name] Weeks in Treatment:  4 Vital Signs Brown(in): 69 Pulse(bpm): 70 Weight(lbs): 220 Blood Pressure 138/64 (mmHg): Body Mass Index(BMI): 32 Temperature(F): 98.2 Respiratory Rate 18 (breaths/min): Photos: [N/A:N/A] Wound Location: Right Foot - Plantar N/A N/A Wounding Event: Gradually Appeared N/A N/A Primary Etiology: Diabetic Wound/Ulcer of N/A N/A the Lower Extremity Comorbid History: Hypertension, Type II N/A N/A Diabetes, Neuropathy Date Acquired: 12/14/2016 N/A N/A Weeks of Treatment: 4 N/A N/A Wound Status: Open N/A N/A Measurements L x W x D 0.3x0.1x0.2 N/A N/A (cm) Area (cm) : 0.024 N/A N/A Volume (cm) : 0.005 N/A N/A % Reduction in Area: 97.10% N/A N/A % Reduction in Volume: 99.30% N/A N/A Classification: Grade 1 N/A N/A Exudate Amount: Large N/A N/A Exudate Type:  Serosanguineous N/A N/A Exudate Color: red, brown N/A N/A Wound Margin: Flat and Intact N/A N/A Granulation Amount: Large (67-100%) N/A N/A Granulation Quality: Pink N/A N/A Necrotic Amount: Small (1-33%) N/A N/A Whitmyer, [name] ([id number]) Exposed Structures: Fascia: No N/A N/A Fat Layer (Subcutaneous Tissue) Exposed: No Tendon: No Muscle: No Joint: No Bone: No Epithelialization: None N/A N/A Debridement: Debridement ([id number]- N/A N/A 11047) Pre-procedure 14:04 N/A N/A Verification/Time Out Taken: Pain Control: Lidocaine 4% Topical N/A N/A Solution Tissue Debrided: Fibrin/Slough, Skin, N/A N/A Subcutaneous Level: Skin/Subcutaneous N/A N/A Tissue Debridement Area (sq 0.03 N/A N/A cm): Instrument: Curette N/A N/A Bleeding: Minimum N/A N/A Hemostasis Achieved: Pressure N/A N/A Procedural Pain: 0 N/A N/A Post Procedural Pain: 0 N/A N/A Debridement Treatment Procedure was tolerated N/A N/A Response: well Post Debridement 0.3x0.2x0.3 N/A N/A Measurements L x W x D (cm) Post Debridement 0.014 N/A N/A Volume: (cm) Periwound Skin Texture: Callus: Yes N/A N/A Excoriation: No Induration:  No Crepitus: No Rash: No Scarring: No Periwound Skin Maceration: No N/A N/A Moisture: Dry/Scaly: No Periwound Skin Color: Atrophie Blanche: No N/A N/A Cyanosis: No Ecchymosis: No Erythema: No Hemosiderin Staining: No Mottled: No Pallor: No Rubor: No Temperature: No Abnormality N/A N/A Yang, [name] ([id number]) Tenderness on Yes N/A N/A Palpation: Wound Preparation: Ulcer Cleansing: N/A N/A Rinsed/Irrigated with Saline Topical Anesthetic Applied: Other: lidocaine 4% Procedures Performed: Debridement N/A N/A Treatment Notes Wound #1 (Right, Plantar Foot) 1. Cleansed with: Clean wound with Normal Saline 2. Anesthetic Topical Lidocaine 4% cream to wound bed prior to debridement 4. Dressing Applied: Prisma Ag 5. Secondary Dressing Applied Gauze and Kerlix/Conform 7. Secured with Tape Notes coban lightly Electronic Signature(s) Signed: 02/16/2017 5:59:51 PM By: Linton Ham MD Previous Signature: 02/16/2017 1:37:16 PM Version By: Montey Hora Entered By: Linton Ham on 02/16/2017 17:19:09 Degrazia, [name] ([id number]) -------------------------------------------------------------------------------- [place] Plan Details Patient Name: Ricardo Brown Date of Service: 02/16/2017 1:30 PM Medical Record Number: [id number] Patient Account Number: [id number] Date of Birth/Sex: 1960/10/05 (56 y.o. Male) Treating RN: Montey Hora Primary Care Dajanae Brophy: [name] Other Clinician: Referring Jemmie Rhinehart: [name] Treating Jayin Derousse/Extender: Tito Dine in Treatment: 4 Active Inactive ` Orientation to the Wound Care Program Nursing Diagnoses: Knowledge deficit related to the wound healing center program Goals: Patient/caregiver will verbalize understanding of the [facility] Program Date Initiated: 01/13/2017 Target Resolution Date: 03/26/2017 Goal Status: Active Interventions: Provide education on  orientation to the wound center Notes: ` Peripheral Neuropathy Nursing Diagnoses: Knowledge deficit related to disease process and management of peripheral neurovascular dysfunction Goals: Patient/caregiver will verbalize understanding of disease process and disease management Date Initiated: 01/13/2017 Target Resolution Date: 03/26/2017 Goal Status: Active Interventions: Assess signs and symptoms of neuropathy upon admission and as needed Notes: ` Wound/Skin Impairment Nursing Diagnoses: Impaired tissue integrity LYRIC, HOAR ([id number]) Goals: Patient/caregiver will verbalize understanding of skin care regimen Date Initiated: 01/13/2017 Target Resolution Date: 03/26/2017 Goal Status: Active Ulcer/skin breakdown will have a volume reduction of 30% by week 4 Date Initiated: 01/13/2017 Target Resolution Date: 03/26/2017 Goal Status: Active Ulcer/skin breakdown will have a volume reduction of 50% by week 8 Date Initiated: 01/13/2017 Target Resolution Date: 03/26/2017 Goal Status: Active Ulcer/skin breakdown will have a volume reduction of 80% by week 12 Date Initiated: 01/13/2017 Target Resolution Date: 03/26/2017 Goal Status: Active Ulcer/skin breakdown will heal within 14 weeks Date Initiated: 01/13/2017 Target Resolution Date: 03/26/2017 Goal Status: Active Interventions: Assess patient/caregiver ability to obtain necessary supplies Assess patient/caregiver ability to perform ulcer/skin  care regimen upon admission and as needed Assess ulceration(s) every visit Notes: Electronic Signature(s) Signed: 02/16/2017 1:37:04 PM By: Montey Hora Entered By: Montey Hora on 02/16/2017 13:37:04 Matheney, [name] ([id number]) -------------------------------------------------------------------------------- Pain Assessment Details Patient Name: Ricardo Brown Date of Service: 02/16/2017 1:30 PM Medical Record Number: [id number] Patient Account Number: [id number] Date  of Birth/Sex: 08-22-1960 (56 y.o. Male) Treating RN: Montey Hora Primary Care Juanmanuel Marohl: [name] Other Clinician: Referring Nateisha Moyd: [name] Treating Itzel Lowrimore/Extender: [name] Weeks in Treatment: 4 Active Problems Location of Pain Severity and Description of Pain Patient Has Paino No Site Locations Pain Management and Medication Current Pain Management: Notes Topical or injectable lidocaine is offered to patient for acute pain when surgical debridement is performed. If needed, Patient is instructed to use over the counter pain medication for the following 24-48 hours after debridement. Wound care MDs do not prescribed pain medications. Patient has chronic pain or uncontrolled pain. Patient has been instructed to make an appointment with their Primary Care Physician for pain management. Electronic Signature(s) Signed: 02/16/2017 5:46:25 PM By: Montey Hora Entered By: Montey Hora on 02/16/2017 13:30:42 Heitzenrater, [name] ([id number]) -------------------------------------------------------------------------------- Patient/Caregiver Education Details Ricardo Brown Date of Service: 02/16/2017 1:30 PM Patient Name: V. Patient Account Number: [id number] Medical Record Treating RN: Montey Hora [id number] Number: Other Clinician: Date of Birth/Gender: 07-Sep-1960 (56 y.o. Male) Treating ROBSON, Hardin Primary Care Physician: [name] Physician/Extender: G Referring Physician: Princess Perna in Treatment: 4 Education Assessment Education Provided To: Patient Education Topics Provided Wound/Skin Impairment: Handouts: Other: wound care as ordered Methods: Demonstration, Explain/Verbal Responses: State content correctly Electronic Signature(s) Signed: 02/16/2017 5:46:25 PM By: Montey Hora Entered By: Montey Hora on 02/16/2017 14:07:14 Aller, [name]  ([id number]) -------------------------------------------------------------------------------- Wound Assessment Details Patient Name: Ricardo Brown Date of Service: 02/16/2017 1:30 PM Medical Record Number: [id number] Patient Account Number: [id number] Date of Birth/Sex: [birth year] (56 y.o. Male) Treating RN: Montey Hora Primary Care Kjersti Dittmer: [name] Other Clinician: Referring Jule Whitsel: [name] Treating Meoshia Billing/Extender: [name] Weeks in Treatment: 4 Wound Status Wound Number: 1 Primary Diabetic Wound/Ulcer of the Lower Etiology: Extremity Wound Location: Right Foot - Plantar Wound Status: Open Wounding Event: Gradually Appeared Comorbid Hypertension, Type II Diabetes, Date Acquired: 12/14/2016 History: Neuropathy Weeks Of Treatment: 4 Clustered Wound: No Photos Wound Measurements Length: (cm) 0.3 Width: (cm) 0.1 Depth: (cm) 0.2 Area: (cm) 0.024 Volume: (cm) 0.005 % Reduction in Area: 97.1% % Reduction in Volume: 99.3% Epithelialization: None Tunneling: No Undermining: No Wound Description Classification: Grade 1 Wound Margin: Flat and Intact Exudate Amount: Large Exudate Type: Serosanguineous Exudate Color: red, brown Foul Odor After Cleansing: No Slough/Fibrino Yes Wound Bed Granulation Amount: Large (67-100%) Exposed Structure Granulation Quality: Pink Fascia Exposed: No Necrotic Amount: Small (1-33%) Fat Layer (Subcutaneous Tissue) Exposed: No Necrotic Quality: Adherent Slough Tendon Exposed: No Muscle Exposed: No Stangler, [name] ([id number]) Joint Exposed: No Bone Exposed: No Periwound Skin Texture Texture Color No Abnormalities Noted: No No Abnormalities Noted: No Callus: Yes Atrophie Blanche: No Crepitus: No Cyanosis: No Excoriation: No Ecchymosis: No Induration: No Erythema: No Rash: No Hemosiderin Staining: No Scarring: No Mottled: No Pallor: No Moisture Rubor: No No Abnormalities Noted: No Dry  / Scaly: No Temperature / Pain Maceration: No Temperature: No Abnormality Tenderness on Palpation: Yes Wound Preparation Ulcer Cleansing: Rinsed/Irrigated with Saline Topical Anesthetic Applied: Other: lidocaine 4%, Treatment Notes Wound #1 (Right, Plantar Foot) 1. Cleansed with: Clean wound with Normal Saline 2. Anesthetic Topical Lidocaine 4% cream to wound bed  prior to debridement 4. Dressing Applied: Prisma Ag 5. Secondary Dressing Applied Gauze and Kerlix/Conform 7. Secured with Tape Notes coban lightly Electronic Signature(s) Signed: 02/16/2017 1:36:14 PM By: Montey Hora Entered By: Montey Hora on 02/16/2017 13:36:13 Tena, [name] ([id number]) -------------------------------------------------------------------------------- [place] Details Patient Name: Ricardo Brown Date of Service: 02/16/2017 1:30 PM Medical Record Number: [id number] Patient Account Number: [id number] Date of Birth/Sex: 1960/10/01 (56 y.o. Male) Treating RN: Montey Hora Primary Care Ladon Vandenberghe: [name] Other Clinician: Referring Augustino Savastano: [name] Treating Trudie Cervantes/Extender: [name] Weeks in Treatment: 4 Vital Signs Time Taken: 13:31 Temperature (F): 98.2 Brown (in): 69 Pulse (bpm): 70 Weight (lbs): 220 Respiratory Rate (breaths/min): 18 Body Mass Index (BMI): 32.5 Blood Pressure (mmHg): 138/64 Reference Range: 80 - 120 mg / dl Electronic Signature(s) Signed: 02/16/2017 5:46:25 PM By: Montey Hora Entered By: Montey Hora on 02/16/2017 13:31:17

## 2017-02-19 DIAGNOSIS — E1165 Type 2 diabetes mellitus with hyperglycemia: Secondary | ICD-10-CM | POA: Diagnosis not present

## 2017-02-19 DIAGNOSIS — E119 Type 2 diabetes mellitus without complications: Secondary | ICD-10-CM | POA: Diagnosis not present

## 2017-02-19 DIAGNOSIS — E785 Hyperlipidemia, unspecified: Secondary | ICD-10-CM | POA: Diagnosis not present

## 2017-02-19 DIAGNOSIS — I471 Supraventricular tachycardia: Secondary | ICD-10-CM | POA: Diagnosis not present

## 2017-02-23 ENCOUNTER — Encounter: Payer: BLUE CROSS/BLUE SHIELD | Admitting: Internal Medicine

## 2017-02-23 DIAGNOSIS — L97513 Non-pressure chronic ulcer of other part of right foot with necrosis of muscle: Secondary | ICD-10-CM | POA: Diagnosis not present

## 2017-02-23 DIAGNOSIS — E1142 Type 2 diabetes mellitus with diabetic polyneuropathy: Secondary | ICD-10-CM | POA: Diagnosis not present

## 2017-02-23 DIAGNOSIS — E11621 Type 2 diabetes mellitus with foot ulcer: Secondary | ICD-10-CM | POA: Diagnosis not present

## 2017-02-23 DIAGNOSIS — S91301D Unspecified open wound, right foot, subsequent encounter: Secondary | ICD-10-CM | POA: Diagnosis not present

## 2017-02-23 DIAGNOSIS — I10 Essential (primary) hypertension: Secondary | ICD-10-CM | POA: Diagnosis not present

## 2017-02-23 DIAGNOSIS — Z794 Long term (current) use of insulin: Secondary | ICD-10-CM | POA: Diagnosis not present

## 2017-02-24 NOTE — Progress Notes (Addendum)
DALEY, GOSSE ([id number]) Visit Report for 02/23/2017 Arrival Information Details Patient Name: ELMOND, POEHLMAN. Date of Service: 02/23/2017 2:30 PM Medical Record Number: [id number] Patient Account Number: [id number] Date of Birth/Sex: 1961-04-13 (56 y.o. Male) Treating RN: Cornell Barman Primary Care Ilina Xu: [name] Other Clinician: Referring Kosisochukwu Goldberg: [name] Treating Rockney Grenz/Extender: Tito Dine in Treatment: 5 Visit Information History Since Last Visit Added or deleted any medications: No Patient Arrived: Ambulatory Any new allergies or adverse reactions: No Arrival Time: 14:36 Had a fall or experienced change in No Accompanied By: wife activities of daily living that may affect Transfer Assistance: None risk of falls: Patient Identification Verified: Yes Signs or symptoms of abuse/neglect since last No Secondary Verification Process Yes visito Completed: Hospitalized since last visit: No Patient Requires Transmission- No Has Dressing in Place as Prescribed: Yes Based Precautions: Has Footwear/Offloading in Place as Yes Patient Has Alerts: Yes Prescribed: Patient Alerts: DMII Right: Wedge ABI [place] Shoe BILATERAL >220 Pain Present Now: No Electronic Signature(s) Signed: 02/23/2017 4:25:40 PM By: Gretta Cool, BSN, RN, CWS, Kim RN, BSN Entered By: Gretta Cool, BSN, RN, CWS, Kim on 02/23/2017 14:36:49 Kievit, [name] ([id number]) -------------------------------------------------------------------------------- Clinic Level of Care Assessment Details Patient Name: [name] Date of Service: 02/23/2017 2:30 PM Medical Record Number: [id number] Patient Account Number: [id number] Date of Birth/Sex: [birth year] (56 y.o. Male) Treating RN: Cornell Barman Primary Care Allaya Abbasi: [name] Other Clinician: Referring Cynethia Schindler: [name] Treating Deni Berti/Extender: Tito Dine in Treatment: 5 Clinic Level of Care  Assessment Items TOOL 4 Quantity Score []  - Use when only an EandM is performed on FOLLOW-UP visit 0 ASSESSMENTS - Nursing Assessment / Reassessment []  - Reassessment of Co-morbidities (includes updates in patient status) 0 X - Reassessment of Adherence to Treatment Plan 1 5 ASSESSMENTS - Wound and Skin Assessment / Reassessment X - Simple Wound Assessment / Reassessment - one wound 1 5 []  - Complex Wound Assessment / Reassessment - multiple wounds 0 []  - Dermatologic / Skin Assessment (not related to wound area) 0 ASSESSMENTS - Focused Assessment []  - Circumferential Edema Measurements - multi extremities 0 []  - Nutritional Assessment / Counseling / Intervention 0 []  - Lower Extremity Assessment (monofilament, tuning fork, pulses) 0 []  - Peripheral Arterial Disease Assessment (using hand held doppler) 0 ASSESSMENTS - Ostomy and/or Continence Assessment and Care []  - Incontinence Assessment and Management 0 []  - Ostomy Care Assessment and Management (repouching, etc.) 0 PROCESS - Coordination of Care X - Simple Patient / Family Education for ongoing care 1 15 []  - Complex (extensive) Patient / Family Education for ongoing care 0 []  - Staff obtains Programmer, systems, Records, Test Results / Process Orders 0 []  - Staff telephones HHA, Nursing Homes / Clarify orders / etc 0 []  - Routine Transfer to another Facility (non-emergent condition) 0 Nevin, [name] ([id number]) []  - Routine Hospital Admission (non-emergent condition) 0 []  - New Admissions / Biomedical engineer / Ordering NPWT, Apligraf, etc. 0 []  - Emergency Hospital Admission (emergent condition) 0 X - Simple Discharge Coordination 1 10 []  - Complex (extensive) Discharge Coordination 0 PROCESS - Special Needs []  - Pediatric / Minor Patient Management 0 []  - Isolation Patient Management 0 []  - Hearing / Language / Visual special needs 0 []  - Assessment of Community assistance (transportation, D/C planning, etc.) 0 []  -  Additional assistance / Altered mentation 0 []  - Support Surface(s) Assessment (bed, cushion, seat, etc.) 0 INTERVENTIONS - Wound Cleansing / Measurement X - Simple Wound Cleansing - one  wound 1 5 []  - Complex Wound Cleansing - multiple wounds 0 X - Wound Imaging (photographs - any number of wounds) 1 5 []  - Wound Tracing (instead of photographs) 0 X - Simple Wound Measurement - one wound 1 5 []  - Complex Wound Measurement - multiple wounds 0 INTERVENTIONS - Wound Dressings []  - Small Wound Dressing one or multiple wounds 0 []  - Medium Wound Dressing one or multiple wounds 0 []  - Large Wound Dressing one or multiple wounds 0 []  - Application of Medications - topical 0 []  - Application of Medications - injection 0 INTERVENTIONS - Miscellaneous []  - External ear exam 0 Pardon, [name] ([id number]) []  - Specimen Collection (cultures, biopsies, blood, body fluids, etc.) 0 []  - Specimen(s) / Culture(s) sent or taken to Lab for analysis 0 []  - Patient Transfer (multiple staff / Harrel Lemon Lift / Similar devices) 0 []  - Simple Staple / Suture removal (25 or less) 0 []  - Complex Staple / Suture removal (26 or more) 0 []  - Hypo / Hyperglycemic Management (close monitor of Blood Glucose) 0 []  - Ankle / Brachial Index (ABI) - do not check if billed separately 0 X - Vital Signs 1 5 Has the patient been seen at the hospital within the last three years: Yes Total Score: 55 Level Of Care: New/Established - Level 2 Electronic Signature(s) Signed: 02/23/2017 4:25:40 PM By: Gretta Cool, BSN, RN, CWS, Kim RN, BSN Entered By: Gretta Cool, BSN, RN, CWS, Kim on 02/23/2017 15:00:01 Buske, [name] ([id number]) -------------------------------------------------------------------------------- Encounter Discharge Information Details Patient Name: [name] Date of Service: 02/23/2017 2:30 PM Medical Record Number: [id number] Patient Account Number: [id number] Date of Birth/Sex: [birth year] (56 y.o.  Male) Treating RN: Cornell Barman Primary Care Yolonda Purtle: [name] Other Clinician: Referring Aadi Bordner: [name] Treating Justeen Hehr/Extender: Tito Dine in Treatment: 5 Encounter Discharge Information Items Discharge Pain Level: 0 Discharge Condition: Stable Ambulatory Status: Ambulatory Discharge Destination: Home Transportation: Private Auto Accompanied By: wife Schedule Follow-up Appointment: Yes Medication Reconciliation completed and provided to Patient/Care Yes Zacchaeus Halm: Provided on Clinical Summary of Care: 02/23/2017 Form Type Recipient Paper Patient WM Electronic Signature(s) Signed: 02/23/2017 4:25:40 PM By: Gretta Cool, BSN, RN, CWS, Kim RN, BSN Previous Signature: 02/23/2017 2:56:00 PM Version By: Ruthine Dose Entered By: Gretta Cool BSN, RN, CWS, Kim on 02/23/2017 15:00:33 Dice, [name] ([id number]) -------------------------------------------------------------------------------- Lower Extremity Assessment Details Patient Name: [name] Date of Service: 02/23/2017 2:30 PM Medical Record Number: [id number] Patient Account Number: [id number] Date of Birth/Sex: 02/12/1961 (56 y.o. Male) Treating RN: Cornell Barman Primary Care Greenly Rarick: [name] Other Clinician: Referring Shernita Rabinovich: [name] Treating Elica Almas/Extender: [name] Weeks in Treatment: 5 Vascular Assessment Pulses: Dorsalis Pedis Palpable: [Right:Yes] Posterior Tibial Extremity colors, hair growth, and conditions: Extremity Color: [Right:Normal] Hair Growth on Extremity: [Right:Yes] Temperature of Extremity: [Right:Warm] Capillary Refill: [Right:< 3 seconds] Dependent Rubor: [Right:No] Blanched when Elevated: [Right:No] Lipodermatosclerosis: [Right:No] Toe Nail Assessment Left: Right: Thick: No Discolored: Yes Deformed: No Improper Length and Hygiene: Yes Electronic Signature(s) Signed: 02/23/2017 4:25:40 PM By: Gretta Cool, BSN, RN, CWS, Kim RN,  BSN Entered By: Gretta Cool, BSN, RN, CWS, Kim on 02/23/2017 14:41:15 Wedin, [name] ([id number]) -------------------------------------------------------------------------------- Multi Wound Chart Details Patient Name: [name] Date of Service: 02/23/2017 2:30 PM Medical Record Number: [id number] Patient Account Number: [id number] Date of Birth/Sex: Sep 24, 1960 (56 y.o. Male) Treating RN: Cornell Barman Primary Care Undra Trembath: [name] Other Clinician: Referring Verbon Giangregorio: [name] Treating Rainna Nearhood/Extender: [name] Weeks in Treatment: 5 Vital Signs Height(in): 26  Pulse(bpm): 74 Weight(lbs): 220 Blood Pressure 148/82 (mmHg): Body Mass Index(BMI): 32 Temperature(F): 98.1 Respiratory Rate 16 (breaths/min): Photos: [N/A:N/A] Wound Location: Right Foot - Plantar N/A N/A Wounding Event: Gradually Appeared N/A N/A Primary Etiology: Diabetic Wound/Ulcer of N/A N/A the Lower Extremity Comorbid History: Hypertension, Type II N/A N/A Diabetes, Neuropathy Date Acquired: 12/14/2016 N/A N/A Weeks of Treatment: 5 N/A N/A Wound Status: Healed - Epithelialized N/A N/A Measurements L x W x D 0x0x0 N/A N/A (cm) Area (cm) : 0 N/A N/A Volume (cm) : 0 N/A N/A % Reduction in Area: 100.00% N/A N/A % Reduction in Volume: 100.00% N/A N/A Classification: Grade 1 N/A N/A Epithelialization: Large (67-100%) N/A N/A Periwound Skin Texture: No Abnormalities Noted N/A N/A Periwound Skin No Abnormalities Noted N/A N/A Moisture: Periwound Skin Color: No Abnormalities Noted N/A N/A Tenderness on No N/A N/A Palpation: CABLE, FEARN ([id number]) Treatment Notes Electronic Signature(s) Signed: 02/23/2017 5:23:04 PM By: Linton Ham MD Entered By: Linton Ham on 02/23/2017 15:01:57 Teicher, [name] ([id number]) -------------------------------------------------------------------------------- Multi-Disciplinary Care Plan Details Patient Name: [name] Date of Service: 02/23/2017 2:30 PM Medical Record Number: [id number] Patient Account Number: [id number] Date of Birth/Sex: [birth year] (56 y.o. Male) Treating RN: Cornell Barman Primary Care Codylee Patil: [name] Other Clinician: Referring Oshen Wlodarczyk: [name] Treating Alexiz Sustaita/Extender: Tito Dine in Treatment: 5 Active Inactive Electronic Signature(s) Signed: 03/03/2017 2:14:48 PM By: Gretta Cool, BSN, RN, CWS, Kim RN, BSN Previous Signature: 02/23/2017 4:25:40 PM Version By: Gretta Cool, BSN, RN, CWS, Kim RN, BSN Entered By: Gretta Cool, BSN, RN, CWS, Kim on 03/03/2017 14:14:47 Suarez, [name] ([id number]) -------------------------------------------------------------------------------- Pain Assessment Details Patient Name: [name] Date of Service: 02/23/2017 2:30 PM Medical Record Number: [id number] Patient Account Number: [id number] Date of Birth/Sex: 1961/07/29 (56 y.o. Male) Treating RN: Cornell Barman Primary Care Shonika Kolasinski: [name] Other Clinician: Referring Shandelle Borrelli: [name] Treating Terrian Sentell/Extender: [name] Weeks in Treatment: 5 Active Problems Location of Pain Severity and Description of Pain Patient Has Paino No Site Locations With Dressing Change: No Pain Management and Medication Current Pain Management: Goals for Pain Management Topical or injectable lidocaine is offered to patient for acute pain when surgical debridement is performed. If needed, Patient is instructed to use over the counter pain medication for the following 24-48 hours after debridement. Wound care MDs do not prescribed pain medications. Patient has chronic pain or uncontrolled pain. Patient has been instructed to make an appointment with their Primary Care Physician for pain management. Electronic Signature(s) Signed: 02/23/2017 4:25:40 PM By: Gretta Cool, BSN, RN, CWS, Kim RN, BSN Entered By: Gretta Cool, BSN, RN, CWS, Kim on 02/23/2017 14:36:58 Goh, [name] ([id number]) -------------------------------------------------------------------------------- Patient/Caregiver Education Details [name] Date of Service: 02/23/2017 2:30 PM Patient Name: V. Patient Account Number: [id number] Medical Record Treating RN: Cornell Barman [id number] Number: Other Clinician: Date of Birth/Gender: 28-Jan-1961 (56 y.o. Male) Treating ROBSON, Bradley Primary Care Physician: [name] Physician/Extender: G Referring Physician: Princess Perna in Treatment: 5 Education Assessment Education Provided To: Patient Education Topics Provided Offloading: Handouts: What is Offloadingo Methods: Demonstration Responses: State content correctly Electronic Signature(s) Signed: 02/23/2017 4:25:40 PM By: Gretta Cool, BSN, RN, CWS, Kim RN, BSN Entered By: Gretta Cool, BSN, RN, CWS, Kim on 02/23/2017 15:00:51 Montemayor, [name] ([id number]) -------------------------------------------------------------------------------- Wound Assessment Details Patient Name: [name] Date of Service: 02/23/2017 2:30 PM Medical Record Number: [id number] Patient Account Number: [id number] Date of Birth/Sex: Apr 12, 1961 (56 y.o. Male) Treating RN: Cornell Barman Primary Care Antrice Pal: [name] Other Clinician: Referring Aysen Shieh: Lavera Guise,  JAVED Treating Alexandrea Westergard/Extender: [name] Weeks in Treatment: 5 Wound Status Wound Number: 1 Primary Diabetic Wound/Ulcer of the Lower Etiology: Extremity Wound Location: Right Foot - Plantar Wound Status: Healed - Epithelialized Wounding Event: Gradually Appeared Comorbid Hypertension, Type II Diabetes, Date Acquired: 12/14/2016 History: Neuropathy Weeks Of Treatment: 5 Clustered Wound: No Photos Wound Measurements Length: (cm) 0 Width: (cm) 0 Depth: (cm) 0 Area: (cm) 0 Volume: (cm) 0 % Reduction in Area: 100% % Reduction in Volume: 100% Epithelialization: Large (67-100%) Tunneling: No Undermining: No Wound  Description Classification: Grade 1 Periwound Skin Texture Texture Color No Abnormalities Noted: No No Abnormalities Noted: No Moisture No Abnormalities Noted: No Electronic Signature(s) Signed: 02/23/2017 4:25:40 PM By: Gretta Cool, BSN, RN, CWS, Kim RN, BSN Entered By: Gretta Cool, BSN, RN, CWS, Kim on 02/23/2017 14:56:25 Hottenstein, [name] ([id number]) -------------------------------------------------------------------------------- Vitals Details Patient Name: [name] Date of Service: 02/23/2017 2:30 PM Medical Record Number: [id number] Patient Account Number: [id number] Date of Birth/Sex: 1960-08-30 (56 y.o. Male) Treating RN: Cornell Barman Primary Care Laelani Vasko: [name] Other Clinician: Referring Kristiann Noyce: [name] Treating Adrick Kestler/Extender: [name] Weeks in Treatment: 5 Vital Signs Time Taken: 14:37 Temperature (F): 98.1 Height (in): 69 Pulse (bpm): 74 Weight (lbs): 220 Respiratory Rate (breaths/min): 16 Body Mass Index (BMI): 32.5 Blood Pressure (mmHg): 148/82 Reference Range: 80 - 120 mg / dl Electronic Signature(s) Signed: 02/23/2017 4:25:40 PM By: Gretta Cool, BSN, RN, CWS, Kim RN, BSN Entered By: Gretta Cool, BSN, RN, CWS, Kim on 02/23/2017 14:37:18

## 2017-02-24 NOTE — Progress Notes (Signed)
Ricardo Brown, Ricardo Brown ([id number]) Visit Report for 02/23/2017 HPI Details Ratterree, [name] Date of Service: 02/23/2017 2:30 PM Patient Name: V. Patient Account Number: [id number] Medical Record Treating RN: Cornell Barman [id number] Number: Other Clinician: Date of Birth/Sex: 1960-08-23 (56 y.o. Male) Treating Linton Ham Primary Care Provider: [name] Provider/Extender: G Referring Provider: Princess Perna in Treatment: 5 History of Present Illness HPI Description: 01/13/17; this is a 56 year old man who is type II diabetic. Over the last month he noted callus and wound development on the plantar aspect of his right foot with the wound center today roughly the second metatarsal head. He has been using Neosporin and a dry dressing. The patient relates some history of numbness in the foot. He does not have known vascular disease or claudication-type symptoms. He is a nonsmoker. He is unaware of his hemoglobin A1c but has an appointment with his primary physician. Primary physician mentioned toe diabetic shoes although he does not have them yet. He states that the shoes he wears to work were wearing down in the forefoot and he thinks he puts to much pressure on this area. He gives a history of an area on the plantar left foot about 6 months ago which sounds a lot like a blister that responded well to offloading. A1c in this clinic is noncompressible bilaterally 01/20/17; patient arrives today with an improved-looking wound. Measurements at 5 x 0.5 x 0.6 units surface of this appears to be healthy. Rolled edges of skin and subcutaneous tissue needed debridement however in general everything looks better. Plain x-ray of the foot was a bit indeterminant but did not show obvious bone destruction. As the patient is improving I'll forego an MRI for the moment 01/28/17; patient's wound is smaller. Still some rolled edges around the wound but nothing looked like it required debridement.  Continuing to use silver alginate. Patient is going to the beach next week we will see him back in 2 weeks 02/09/17 on evaluation today patient appears to have a significant amount of calloused surrounding the wound on his forefoot. He tells me upon questioning that he does sometimes go barefoot around his house which I think could be part of the issue in this regard. Nonetheless his wound has been improving in size since initial evaluation. He has no discomfort and he also tells me that his blood sugars have been running in the 200 range and he even tells me that his primary care provider attempted to add insulin to his current regimen unfortunately this will. We discussed briefly he may need to see an endocrinologist. 02/16/17; again a continued amount of thick callus around the wound which requires debridement. Even though he is wearing a Darco forefoot off loader I don't think we are offloading this enough. I discussed the total contact cast with him. Depth of this wound is 0.6 cm. I don't think this is a small enough wound probed Iodosorb ointment 02/23/17; the patient arrived today with a viable surface over the wound. I was somewhat surprised by this outcome given the fact that last week the depth of this was 0.6 cm. It is possible that the surface went over the wound rather than having the wound totally fill in. Ricardo Brown, Ricardo Brown ([id number]) Electronic Signature(s) Signed: 02/23/2017 5:23:04 PM By: Linton Ham MD Entered By: Linton Ham on 02/23/2017 15:04:38 Ricardo Brown, Ricardo Brown ([id number]) -------------------------------------------------------------------------------- Physical Exam Details Ricardo Brown Date of Service: 02/23/2017 2:30 PM Patient Name: V. Patient Account Number: [id number] Medical Record Treating RN: Cornell Barman  [id number] Number: Other Clinician: Date of Birth/Sex: 01/04/1961 (56 y.o. Male) Treating Vishruth Seoane Primary Care Provider: [name] Provider/Extender: G Referring Provider: [name] Weeks in Treatment: 5 Constitutional Patient is hypertensive.. Pulse regular and within target range for patient.[name] Respirations regular, non-labored and within target range.. Temperature is normal and within the target range for the patient.[name] appears in no distress. Notes Wound exam; there is a surface over this wound. It was carefully examined there is no open area. Not a lot of callus which is unusual for him however there is some period there is no surrounding erythema and no tenderness Electronic Signature(s) Signed: 02/23/2017 5:23:04 PM By: Linton Ham MD Entered By: Linton Ham on 02/23/2017 15:05:57 Ricardo Brown, Ricardo Brown ([id number]) -------------------------------------------------------------------------------- Physician Orders Details Ricardo Brown Date of Service: 02/23/2017 2:30 PM Patient Name: V. Patient Account Number: [id number] Medical Record Treating RN: Cornell Barman [id number] Number: Other Clinician: Date of Birth/Sex: Mar 03, 1961 (55 y.o. Male) Treating Linton Ham Primary Care Provider: [name] Provider/Extender: G Referring Provider: Princess Perna in Treatment: 5 Verbal / Phone Orders: No Diagnosis Coding Discharge From [facility] Wound #1 [place] o Discharge from [place] Signature(s) Signed: 02/23/2017 4:25:40 PM By: Gretta Cool, BSN, RN, CWS, Kim RN, BSN Signed: 02/23/2017 5:23:04 PM By: Linton Ham MD Entered By: Gretta Cool, BSN, RN, CWS, Kim on 02/23/2017 14:55:34 Ricardo Brown, Ricardo Brown ([id number]) -------------------------------------------------------------------------------- Problem List Details Ricardo Brown Date of Service: 02/23/2017 2:30 PM Patient Name: V. Patient Account Number: [id number] Medical Record Treating RN: Cornell Barman [id number] Number: Other Clinician: Date of Birth/Sex: 07-19-1961 (56 y.o. 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 Patient Account Number: [id number] Medical Record Treating RN: Cornell Barman [id number] Number: Other Clinician: Date of Birth/Sex: 09-16-1960 (56 y.o. Male) Treating Linton Ham Primary Care Provider: [name] Provider/Extender: G Referring Provider: [name] Weeks in Treatment: 5 Subjective History of Present Illness (HPI) 01/13/17; this is a 56 year old man who is type II diabetic. Over the last month he noted callus and wound development on the plantar aspect of his right foot with the wound center today roughly the second metatarsal head. He has been using Neosporin and a dry dressing. The patient relates some history of numbness in the foot. He does not have known vascular disease or claudication-type symptoms. He is a nonsmoker. He is unaware of his hemoglobin A1c but has an appointment with his primary physician. Primary physician mentioned toe diabetic shoes although he does not have them yet. He states that the shoes he wears to work were wearing down in the forefoot and he thinks he puts to much pressure on this area. He gives a history of an area on the plantar left  foot about 6 months ago which sounds a lot like a blister that responded well to offloading. A1c in this clinic is noncompressible bilaterally 01/20/17; patient arrives today with an improved-looking wound. Measurements at 5 x 0.5 x 0.6  units surface of this appears to be healthy. Rolled edges of skin and subcutaneous tissue needed debridement however in general everything looks better. Plain x-ray of the foot was a bit indeterminant but did not show obvious bone destruction. As the patient is improving I'll forego an MRI for the moment 01/28/17; patient's wound is smaller. Still some rolled edges around the wound but nothing looked like it required debridement. Continuing to use silver alginate. Patient is going to the beach next week we will see him back in 2 weeks 02/09/17 on evaluation today patient appears to have a significant amount of calloused surrounding the wound on his forefoot. He tells me upon questioning that he does sometimes go barefoot around his house which I think could be part of the issue in this regard. Nonetheless his wound has been improving in size since initial evaluation. He has no discomfort and he also tells me that his blood sugars have been running in the 200 range and he even tells me that his primary care provider attempted to add insulin to his current regimen unfortunately this will. We discussed briefly he may need to see an endocrinologist. 02/16/17; again a continued amount of thick callus around the wound which requires debridement. Even though he is wearing a Darco forefoot off loader I don't think we are offloading this enough. I discussed the total contact cast with him. Depth of this wound is 0.6 cm. I don't think this is a small enough wound probed Iodosorb ointment 02/23/17; the patient arrived today with a viable surface over the wound. I was somewhat surprised by this outcome given the fact that last week the depth of this was 0.6 cm. It is possible  that the surface went over the wound rather than having the wound totally fill in. Ricardo Brown, Ricardo Brown ([id number]) Objective Constitutional Patient is hypertensive.. Pulse regular and within target range for patient.[name] Respirations regular, non-labored and within target range.. Temperature is normal and within the target range for the patient.[name] appears in no distress. Vitals Time Taken: 2:37 PM, Brown: 69 in, Weight: 220 lbs, BMI: 32.5, Temperature: 98.1 F, Pulse: 74 bpm, Respiratory Rate: 16 breaths/min, Blood Pressure: 148/82 mmHg. General Notes: Wound exam; there is a surface over this wound. It was carefully examined there is no open area. Not a lot of callus which is unusual for him however there is some period there is no surrounding erythema and no tenderness Integumentary (Hair, Skin) Wound #1 status is Healed - Epithelialized. Original cause of wound was Gradually Appeared. The wound is located on the [place]. The wound measures 0cm length x 0cm width x 0cm depth; 0cm^2 area and 0cm^3 volume. There is no tunneling or undermining noted. Assessment Active Problems ICD-10 E11.621 - Type 2 diabetes mellitus with foot ulcer L97.513 - Non-pressure chronic ulcer of other part of right foot with necrosis of muscle E11.42 - Type 2 diabetes mellitus with diabetic polyneuropathy Plan Discharge From [facility] Services: Wound #1 Right,Plantar Foot: Discharge from [place] ([id number]) o #1 the patient has what appears to be a viable "healed" state over the surface of this wound. #2 must admit to some surprise at this outcome this week and I'm a little concerned that there could be a virtual space here. Nevertheless I felt we could offload this area and see how this does over time. #3 no evidence of infection Electronic Signature(s) Signed: 02/23/2017 5:23:04 PM By: Linton Ham MD Entered By: Linton Ham on 02/23/2017 15:08:18  Ricardo Brown, Ricardo Brown ([id number]) -------------------------------------------------------------------------------- Glory Buff Details Ridlon, [name] Date of Service: 02/23/2017 Patient Name: V. Patient Account Number: [id number] Medical Record Treating RN: Cornell Barman [id number] Number: Other Clinician: Date of Birth/Sex: November 08, 1960 (56 y.o. Male) Treating Babyboy Loya, Pul[place]io Bareas Primary Care Provider: [name] Provider/Extender: G Referring Provider: [name] Weeks in Treatment: 5 Diagnosis Coding ICD-10 Codes Code Description E11.621 Type 2 diabetes mellitus with foot ulcer L97.513 Non-pressure chronic ulcer of other part of right foot with necrosis of muscle E11.42 Type 2 diabetes mellitus with diabetic polyneuropathy Facility Procedures CPT4 Code: [id number] Description: [phone] - WOUND CARE VISIT-LEV 2 EST PT Modifier: Quantity: 1 Physician Procedures CPT4: Description Modifier Quantity Code [id number] - WC PHYS LEVEL 2 - EST PT 1 ICD-10 Description Diagnosis E11.621 Type 2 diabetes mellitus with foot ulcer L97.513 Non-pressure chronic ulcer of other part of right foot with necrosis of muscle Electronic Signature(s) Signed: 02/23/2017 5:23:04 PM By: Linton Ham MD Entered By: Linton Ham on 02/23/2017 15:09:03

## 2017-06-30 DIAGNOSIS — E113532 Type 2 diabetes mellitus with proliferative diabetic retinopathy with traction retinal detachment not involving the macula, left eye: Secondary | ICD-10-CM | POA: Diagnosis not present

## 2017-07-01 DIAGNOSIS — E113511 Type 2 diabetes mellitus with proliferative diabetic retinopathy with macular edema, right eye: Secondary | ICD-10-CM | POA: Diagnosis not present

## 2017-07-01 DIAGNOSIS — H3582 Retinal ischemia: Secondary | ICD-10-CM | POA: Diagnosis not present

## 2017-07-01 DIAGNOSIS — E113532 Type 2 diabetes mellitus with proliferative diabetic retinopathy with traction retinal detachment not involving the macula, left eye: Secondary | ICD-10-CM | POA: Diagnosis not present

## 2017-07-01 DIAGNOSIS — H4312 Vitreous hemorrhage, left eye: Secondary | ICD-10-CM | POA: Diagnosis not present

## 2017-07-07 DIAGNOSIS — E785 Hyperlipidemia, unspecified: Secondary | ICD-10-CM | POA: Diagnosis not present

## 2017-07-07 DIAGNOSIS — I471 Supraventricular tachycardia: Secondary | ICD-10-CM | POA: Diagnosis not present

## 2017-07-07 DIAGNOSIS — I10 Essential (primary) hypertension: Secondary | ICD-10-CM | POA: Diagnosis not present

## 2017-07-07 DIAGNOSIS — E119 Type 2 diabetes mellitus without complications: Secondary | ICD-10-CM | POA: Diagnosis not present

## 2017-07-08 DIAGNOSIS — C61 Malignant neoplasm of prostate: Secondary | ICD-10-CM | POA: Diagnosis not present

## 2017-07-08 DIAGNOSIS — R5381 Other malaise: Secondary | ICD-10-CM | POA: Diagnosis not present

## 2017-07-08 DIAGNOSIS — E119 Type 2 diabetes mellitus without complications: Secondary | ICD-10-CM | POA: Diagnosis not present

## 2017-07-08 DIAGNOSIS — I10 Essential (primary) hypertension: Secondary | ICD-10-CM | POA: Diagnosis not present

## 2017-07-15 DIAGNOSIS — E113511 Type 2 diabetes mellitus with proliferative diabetic retinopathy with macular edema, right eye: Secondary | ICD-10-CM | POA: Diagnosis not present

## 2017-07-15 DIAGNOSIS — E113532 Type 2 diabetes mellitus with proliferative diabetic retinopathy with traction retinal detachment not involving the macula, left eye: Secondary | ICD-10-CM | POA: Diagnosis not present

## 2017-07-16 DIAGNOSIS — E119 Type 2 diabetes mellitus without complications: Secondary | ICD-10-CM | POA: Diagnosis not present

## 2017-07-16 DIAGNOSIS — I471 Supraventricular tachycardia: Secondary | ICD-10-CM | POA: Diagnosis not present

## 2017-07-16 DIAGNOSIS — E1165 Type 2 diabetes mellitus with hyperglycemia: Secondary | ICD-10-CM | POA: Diagnosis not present

## 2017-07-16 DIAGNOSIS — E785 Hyperlipidemia, unspecified: Secondary | ICD-10-CM | POA: Diagnosis not present

## 2017-08-13 DIAGNOSIS — E785 Hyperlipidemia, unspecified: Secondary | ICD-10-CM | POA: Diagnosis not present

## 2017-08-13 DIAGNOSIS — G479 Sleep disorder, unspecified: Secondary | ICD-10-CM | POA: Diagnosis not present

## 2017-08-13 DIAGNOSIS — I10 Essential (primary) hypertension: Secondary | ICD-10-CM | POA: Diagnosis not present

## 2017-08-13 DIAGNOSIS — E1165 Type 2 diabetes mellitus with hyperglycemia: Secondary | ICD-10-CM | POA: Diagnosis not present

## 2017-08-16 DIAGNOSIS — H4312 Vitreous hemorrhage, left eye: Secondary | ICD-10-CM | POA: Diagnosis not present

## 2017-08-16 DIAGNOSIS — H3582 Retinal ischemia: Secondary | ICD-10-CM | POA: Diagnosis not present

## 2017-08-16 DIAGNOSIS — E113511 Type 2 diabetes mellitus with proliferative diabetic retinopathy with macular edema, right eye: Secondary | ICD-10-CM | POA: Diagnosis not present

## 2017-08-16 DIAGNOSIS — E113532 Type 2 diabetes mellitus with proliferative diabetic retinopathy with traction retinal detachment not involving the macula, left eye: Secondary | ICD-10-CM | POA: Diagnosis not present

## 2017-08-18 DIAGNOSIS — E1165 Type 2 diabetes mellitus with hyperglycemia: Secondary | ICD-10-CM | POA: Diagnosis not present

## 2017-08-18 DIAGNOSIS — E7801 Familial hypercholesterolemia: Secondary | ICD-10-CM | POA: Diagnosis not present

## 2017-08-18 DIAGNOSIS — E13621 Other specified diabetes mellitus with foot ulcer: Secondary | ICD-10-CM | POA: Diagnosis not present

## 2017-08-18 DIAGNOSIS — E782 Mixed hyperlipidemia: Secondary | ICD-10-CM | POA: Diagnosis not present

## 2017-08-18 DIAGNOSIS — I471 Supraventricular tachycardia: Secondary | ICD-10-CM | POA: Diagnosis not present

## 2017-08-18 DIAGNOSIS — B351 Tinea unguium: Secondary | ICD-10-CM | POA: Diagnosis not present

## 2017-08-18 DIAGNOSIS — I10 Essential (primary) hypertension: Secondary | ICD-10-CM | POA: Diagnosis not present

## 2017-08-24 DIAGNOSIS — E782 Mixed hyperlipidemia: Secondary | ICD-10-CM | POA: Diagnosis not present

## 2017-08-24 DIAGNOSIS — E1165 Type 2 diabetes mellitus with hyperglycemia: Secondary | ICD-10-CM | POA: Diagnosis not present

## 2017-08-24 DIAGNOSIS — E7801 Familial hypercholesterolemia: Secondary | ICD-10-CM | POA: Diagnosis not present

## 2017-08-24 DIAGNOSIS — E0842 Diabetes mellitus due to underlying condition with diabetic polyneuropathy: Secondary | ICD-10-CM | POA: Diagnosis not present

## 2017-08-30 DIAGNOSIS — E113511 Type 2 diabetes mellitus with proliferative diabetic retinopathy with macular edema, right eye: Secondary | ICD-10-CM | POA: Diagnosis not present

## 2017-08-31 DIAGNOSIS — E0842 Diabetes mellitus due to underlying condition with diabetic polyneuropathy: Secondary | ICD-10-CM | POA: Diagnosis not present

## 2017-08-31 DIAGNOSIS — E7801 Familial hypercholesterolemia: Secondary | ICD-10-CM | POA: Diagnosis not present

## 2017-08-31 DIAGNOSIS — I10 Essential (primary) hypertension: Secondary | ICD-10-CM | POA: Diagnosis not present

## 2017-08-31 DIAGNOSIS — I471 Supraventricular tachycardia: Secondary | ICD-10-CM | POA: Diagnosis not present

## 2017-09-03 DIAGNOSIS — I471 Supraventricular tachycardia: Secondary | ICD-10-CM | POA: Diagnosis not present

## 2017-09-03 DIAGNOSIS — E1165 Type 2 diabetes mellitus with hyperglycemia: Secondary | ICD-10-CM | POA: Diagnosis not present

## 2017-09-03 DIAGNOSIS — E13621 Other specified diabetes mellitus with foot ulcer: Secondary | ICD-10-CM | POA: Diagnosis not present

## 2017-09-03 DIAGNOSIS — B351 Tinea unguium: Secondary | ICD-10-CM | POA: Diagnosis not present

## 2017-09-03 DIAGNOSIS — I10 Essential (primary) hypertension: Secondary | ICD-10-CM | POA: Diagnosis not present

## 2017-09-03 DIAGNOSIS — E7801 Familial hypercholesterolemia: Secondary | ICD-10-CM | POA: Diagnosis not present

## 2017-09-13 DIAGNOSIS — E119 Type 2 diabetes mellitus without complications: Secondary | ICD-10-CM | POA: Diagnosis not present

## 2017-09-13 DIAGNOSIS — E1165 Type 2 diabetes mellitus with hyperglycemia: Secondary | ICD-10-CM | POA: Diagnosis not present

## 2017-09-13 DIAGNOSIS — R002 Palpitations: Secondary | ICD-10-CM | POA: Diagnosis not present

## 2017-09-13 DIAGNOSIS — R2681 Unsteadiness on feet: Secondary | ICD-10-CM | POA: Diagnosis not present

## 2017-09-20 DIAGNOSIS — E113522 Type 2 diabetes mellitus with proliferative diabetic retinopathy with traction retinal detachment involving the macula, left eye: Secondary | ICD-10-CM | POA: Diagnosis not present

## 2017-09-20 DIAGNOSIS — H3563 Retinal hemorrhage, bilateral: Secondary | ICD-10-CM | POA: Diagnosis not present

## 2017-09-20 DIAGNOSIS — H4312 Vitreous hemorrhage, left eye: Secondary | ICD-10-CM | POA: Diagnosis not present

## 2017-09-20 DIAGNOSIS — E113511 Type 2 diabetes mellitus with proliferative diabetic retinopathy with macular edema, right eye: Secondary | ICD-10-CM | POA: Diagnosis not present

## 2017-09-22 DIAGNOSIS — E785 Hyperlipidemia, unspecified: Secondary | ICD-10-CM | POA: Diagnosis not present

## 2017-09-22 DIAGNOSIS — E1165 Type 2 diabetes mellitus with hyperglycemia: Secondary | ICD-10-CM | POA: Diagnosis not present

## 2017-09-22 DIAGNOSIS — R2681 Unsteadiness on feet: Secondary | ICD-10-CM | POA: Diagnosis not present

## 2017-09-22 DIAGNOSIS — E119 Type 2 diabetes mellitus without complications: Secondary | ICD-10-CM | POA: Diagnosis not present

## 2017-10-01 DIAGNOSIS — E1165 Type 2 diabetes mellitus with hyperglycemia: Secondary | ICD-10-CM | POA: Diagnosis not present

## 2017-10-01 DIAGNOSIS — E133513 Other specified diabetes mellitus with proliferative diabetic retinopathy with macular edema, bilateral: Secondary | ICD-10-CM | POA: Diagnosis not present

## 2017-10-15 DIAGNOSIS — E113522 Type 2 diabetes mellitus with proliferative diabetic retinopathy with traction retinal detachment involving the macula, left eye: Secondary | ICD-10-CM | POA: Diagnosis not present

## 2017-10-15 DIAGNOSIS — H4312 Vitreous hemorrhage, left eye: Secondary | ICD-10-CM | POA: Diagnosis not present

## 2017-10-29 DIAGNOSIS — I471 Supraventricular tachycardia: Secondary | ICD-10-CM | POA: Diagnosis not present

## 2017-10-29 DIAGNOSIS — E1165 Type 2 diabetes mellitus with hyperglycemia: Secondary | ICD-10-CM | POA: Diagnosis not present

## 2017-10-29 DIAGNOSIS — R002 Palpitations: Secondary | ICD-10-CM | POA: Diagnosis not present

## 2017-10-29 DIAGNOSIS — E7801 Familial hypercholesterolemia: Secondary | ICD-10-CM | POA: Diagnosis not present

## 2017-10-29 DIAGNOSIS — E6609 Other obesity due to excess calories: Secondary | ICD-10-CM | POA: Diagnosis not present

## 2017-10-29 DIAGNOSIS — I10 Essential (primary) hypertension: Secondary | ICD-10-CM | POA: Diagnosis not present

## 2017-11-05 DIAGNOSIS — E6609 Other obesity due to excess calories: Secondary | ICD-10-CM | POA: Diagnosis not present

## 2017-11-05 DIAGNOSIS — E1165 Type 2 diabetes mellitus with hyperglycemia: Secondary | ICD-10-CM | POA: Diagnosis not present

## 2017-11-05 DIAGNOSIS — I10 Essential (primary) hypertension: Secondary | ICD-10-CM | POA: Diagnosis not present

## 2017-11-05 DIAGNOSIS — E7801 Familial hypercholesterolemia: Secondary | ICD-10-CM | POA: Diagnosis not present

## 2017-11-05 DIAGNOSIS — E782 Mixed hyperlipidemia: Secondary | ICD-10-CM | POA: Diagnosis not present

## 2017-11-05 DIAGNOSIS — E13621 Other specified diabetes mellitus with foot ulcer: Secondary | ICD-10-CM | POA: Diagnosis not present

## 2017-11-05 DIAGNOSIS — I471 Supraventricular tachycardia: Secondary | ICD-10-CM | POA: Diagnosis not present

## 2017-11-08 DIAGNOSIS — E113511 Type 2 diabetes mellitus with proliferative diabetic retinopathy with macular edema, right eye: Secondary | ICD-10-CM | POA: Diagnosis not present

## 2017-11-08 DIAGNOSIS — H4312 Vitreous hemorrhage, left eye: Secondary | ICD-10-CM | POA: Diagnosis not present

## 2017-11-08 DIAGNOSIS — E113522 Type 2 diabetes mellitus with proliferative diabetic retinopathy with traction retinal detachment involving the macula, left eye: Secondary | ICD-10-CM | POA: Diagnosis not present

## 2017-11-22 DIAGNOSIS — H3563 Retinal hemorrhage, bilateral: Secondary | ICD-10-CM | POA: Diagnosis not present

## 2017-11-22 DIAGNOSIS — E113511 Type 2 diabetes mellitus with proliferative diabetic retinopathy with macular edema, right eye: Secondary | ICD-10-CM | POA: Diagnosis not present

## 2017-11-22 DIAGNOSIS — E113522 Type 2 diabetes mellitus with proliferative diabetic retinopathy with traction retinal detachment involving the macula, left eye: Secondary | ICD-10-CM | POA: Diagnosis not present

## 2017-11-22 DIAGNOSIS — H3582 Retinal ischemia: Secondary | ICD-10-CM | POA: Diagnosis not present

## 2017-12-30 DIAGNOSIS — E113512 Type 2 diabetes mellitus with proliferative diabetic retinopathy with macular edema, left eye: Secondary | ICD-10-CM | POA: Diagnosis not present

## 2018-01-07 DIAGNOSIS — B351 Tinea unguium: Secondary | ICD-10-CM | POA: Diagnosis not present

## 2018-01-07 DIAGNOSIS — E133513 Other specified diabetes mellitus with proliferative diabetic retinopathy with macular edema, bilateral: Secondary | ICD-10-CM | POA: Diagnosis not present

## 2018-01-07 DIAGNOSIS — I10 Essential (primary) hypertension: Secondary | ICD-10-CM | POA: Diagnosis not present

## 2018-01-07 DIAGNOSIS — E7801 Familial hypercholesterolemia: Secondary | ICD-10-CM | POA: Diagnosis not present

## 2018-01-07 DIAGNOSIS — E6609 Other obesity due to excess calories: Secondary | ICD-10-CM | POA: Diagnosis not present

## 2018-01-07 DIAGNOSIS — I471 Supraventricular tachycardia: Secondary | ICD-10-CM | POA: Diagnosis not present

## 2018-01-07 DIAGNOSIS — R002 Palpitations: Secondary | ICD-10-CM | POA: Diagnosis not present

## 2018-01-07 DIAGNOSIS — E782 Mixed hyperlipidemia: Secondary | ICD-10-CM | POA: Diagnosis not present

## 2018-01-07 DIAGNOSIS — E1165 Type 2 diabetes mellitus with hyperglycemia: Secondary | ICD-10-CM | POA: Diagnosis not present

## 2018-01-07 DIAGNOSIS — G2581 Restless legs syndrome: Secondary | ICD-10-CM | POA: Diagnosis not present

## 2018-01-14 DIAGNOSIS — I10 Essential (primary) hypertension: Secondary | ICD-10-CM | POA: Diagnosis not present

## 2018-01-14 DIAGNOSIS — E7801 Familial hypercholesterolemia: Secondary | ICD-10-CM | POA: Diagnosis not present

## 2018-01-14 DIAGNOSIS — Z6833 Body mass index (BMI) 33.0-33.9, adult: Secondary | ICD-10-CM | POA: Diagnosis not present

## 2018-01-14 DIAGNOSIS — E1165 Type 2 diabetes mellitus with hyperglycemia: Secondary | ICD-10-CM | POA: Diagnosis not present

## 2018-01-14 DIAGNOSIS — E782 Mixed hyperlipidemia: Secondary | ICD-10-CM | POA: Diagnosis not present

## 2018-01-24 ENCOUNTER — Emergency Department: Payer: BLUE CROSS/BLUE SHIELD

## 2018-01-24 ENCOUNTER — Inpatient Hospital Stay
Admission: EM | Admit: 2018-01-24 | Discharge: 2018-01-24 | DRG: 281 | Disposition: A | Discharge status: Other Acute Inpatient Hospital | Payer: BLUE CROSS/BLUE SHIELD | Attending: Internal Medicine | Admitting: Internal Medicine

## 2018-01-24 ENCOUNTER — Encounter: Payer: Self-pay | Admitting: Emergency Medicine

## 2018-01-24 ENCOUNTER — Other Ambulatory Visit: Payer: Self-pay

## 2018-01-24 ENCOUNTER — Emergency Department (HOSPITAL_COMMUNITY)
Admit: 2018-01-24 | Discharge: 2018-01-24 | Disposition: A | Discharge status: Home / Self Care | Payer: BLUE CROSS/BLUE SHIELD | Attending: Nurse Practitioner | Admitting: Nurse Practitioner

## 2018-01-24 ENCOUNTER — Encounter
Admission: EM | Disposition: A | Discharge status: Other Acute Inpatient Hospital | Payer: Self-pay | Source: Home / Self Care | Attending: Internal Medicine

## 2018-01-24 DIAGNOSIS — I4892 Unspecified atrial flutter: Secondary | ICD-10-CM | POA: Diagnosis not present

## 2018-01-24 DIAGNOSIS — E871 Hypo-osmolality and hyponatremia: Secondary | ICD-10-CM | POA: Diagnosis not present

## 2018-01-24 DIAGNOSIS — I2111 ST elevation (STEMI) myocardial infarction involving right coronary artery: Secondary | ICD-10-CM

## 2018-01-24 DIAGNOSIS — R739 Hyperglycemia, unspecified: Secondary | ICD-10-CM | POA: Diagnosis not present

## 2018-01-24 DIAGNOSIS — I700 Atherosclerosis of aorta: Secondary | ICD-10-CM | POA: Diagnosis not present

## 2018-01-24 DIAGNOSIS — E11649 Type 2 diabetes mellitus with hypoglycemia without coma: Secondary | ICD-10-CM | POA: Diagnosis not present

## 2018-01-24 DIAGNOSIS — I4510 Unspecified right bundle-branch block: Secondary | ICD-10-CM | POA: Diagnosis not present

## 2018-01-24 DIAGNOSIS — I213 ST elevation (STEMI) myocardial infarction of unspecified site: Secondary | ICD-10-CM

## 2018-01-24 DIAGNOSIS — Z8679 Personal history of other diseases of the circulatory system: Secondary | ICD-10-CM | POA: Diagnosis not present

## 2018-01-24 DIAGNOSIS — N181 Chronic kidney disease, stage 1: Secondary | ICD-10-CM

## 2018-01-24 DIAGNOSIS — N289 Disorder of kidney and ureter, unspecified: Secondary | ICD-10-CM | POA: Diagnosis present

## 2018-01-24 DIAGNOSIS — Z7984 Long term (current) use of oral hypoglycemic drugs: Secondary | ICD-10-CM

## 2018-01-24 DIAGNOSIS — I110 Hypertensive heart disease with heart failure: Secondary | ICD-10-CM | POA: Diagnosis not present

## 2018-01-24 DIAGNOSIS — I340 Nonrheumatic mitral (valve) insufficiency: Secondary | ICD-10-CM | POA: Diagnosis not present

## 2018-01-24 DIAGNOSIS — I2119 ST elevation (STEMI) myocardial infarction involving other coronary artery of inferior wall: Secondary | ICD-10-CM | POA: Diagnosis not present

## 2018-01-24 DIAGNOSIS — E860 Dehydration: Secondary | ICD-10-CM | POA: Diagnosis present

## 2018-01-24 DIAGNOSIS — I5023 Acute on chronic systolic (congestive) heart failure: Secondary | ICD-10-CM

## 2018-01-24 DIAGNOSIS — R0602 Shortness of breath: Secondary | ICD-10-CM

## 2018-01-24 DIAGNOSIS — E785 Hyperlipidemia, unspecified: Secondary | ICD-10-CM | POA: Diagnosis present

## 2018-01-24 DIAGNOSIS — Z951 Presence of aortocoronary bypass graft: Secondary | ICD-10-CM | POA: Diagnosis not present

## 2018-01-24 DIAGNOSIS — E1165 Type 2 diabetes mellitus with hyperglycemia: Secondary | ICD-10-CM | POA: Diagnosis not present

## 2018-01-24 DIAGNOSIS — E875 Hyperkalemia: Secondary | ICD-10-CM | POA: Diagnosis not present

## 2018-01-24 DIAGNOSIS — I10 Essential (primary) hypertension: Secondary | ICD-10-CM

## 2018-01-24 DIAGNOSIS — J9811 Atelectasis: Secondary | ICD-10-CM | POA: Diagnosis not present

## 2018-01-24 DIAGNOSIS — I2510 Atherosclerotic heart disease of native coronary artery without angina pectoris: Secondary | ICD-10-CM | POA: Diagnosis present

## 2018-01-24 DIAGNOSIS — E119 Type 2 diabetes mellitus without complications: Secondary | ICD-10-CM | POA: Diagnosis present

## 2018-01-24 DIAGNOSIS — Z79899 Other long term (current) drug therapy: Secondary | ICD-10-CM | POA: Diagnosis not present

## 2018-01-24 DIAGNOSIS — R110 Nausea: Secondary | ICD-10-CM | POA: Diagnosis not present

## 2018-01-24 DIAGNOSIS — J811 Chronic pulmonary edema: Secondary | ICD-10-CM | POA: Diagnosis not present

## 2018-01-24 DIAGNOSIS — I4891 Unspecified atrial fibrillation: Secondary | ICD-10-CM | POA: Diagnosis not present

## 2018-01-24 DIAGNOSIS — Z7951 Long term (current) use of inhaled steroids: Secondary | ICD-10-CM

## 2018-01-24 DIAGNOSIS — I517 Cardiomegaly: Secondary | ICD-10-CM | POA: Diagnosis not present

## 2018-01-24 DIAGNOSIS — I272 Pulmonary hypertension, unspecified: Secondary | ICD-10-CM | POA: Diagnosis present

## 2018-01-24 DIAGNOSIS — I240 Acute coronary thrombosis not resulting in myocardial infarction: Secondary | ICD-10-CM | POA: Diagnosis not present

## 2018-01-24 DIAGNOSIS — Z888 Allergy status to other drugs, medicaments and biological substances status: Secondary | ICD-10-CM | POA: Diagnosis not present

## 2018-01-24 DIAGNOSIS — I509 Heart failure, unspecified: Secondary | ICD-10-CM | POA: Diagnosis not present

## 2018-01-24 DIAGNOSIS — I951 Orthostatic hypotension: Secondary | ICD-10-CM | POA: Diagnosis not present

## 2018-01-24 DIAGNOSIS — Z794 Long term (current) use of insulin: Secondary | ICD-10-CM | POA: Diagnosis not present

## 2018-01-24 DIAGNOSIS — I130 Hypertensive heart and chronic kidney disease with heart failure and stage 1 through stage 4 chronic kidney disease, or unspecified chronic kidney disease: Secondary | ICD-10-CM

## 2018-01-24 DIAGNOSIS — K5900 Constipation, unspecified: Secondary | ICD-10-CM | POA: Diagnosis not present

## 2018-01-24 DIAGNOSIS — N179 Acute kidney failure, unspecified: Secondary | ICD-10-CM | POA: Diagnosis not present

## 2018-01-24 DIAGNOSIS — J90 Pleural effusion, not elsewhere classified: Secondary | ICD-10-CM | POA: Diagnosis not present

## 2018-01-24 DIAGNOSIS — Z8249 Family history of ischemic heart disease and other diseases of the circulatory system: Secondary | ICD-10-CM | POA: Diagnosis not present

## 2018-01-24 DIAGNOSIS — R509 Fever, unspecified: Secondary | ICD-10-CM | POA: Diagnosis not present

## 2018-01-24 DIAGNOSIS — I25119 Atherosclerotic heart disease of native coronary artery with unspecified angina pectoris: Secondary | ICD-10-CM | POA: Diagnosis not present

## 2018-01-24 DIAGNOSIS — R918 Other nonspecific abnormal finding of lung field: Secondary | ICD-10-CM | POA: Diagnosis not present

## 2018-01-24 DIAGNOSIS — I214 Non-ST elevation (NSTEMI) myocardial infarction: Secondary | ICD-10-CM | POA: Diagnosis not present

## 2018-01-24 HISTORY — PX: LEFT HEART CATH AND CORONARY ANGIOGRAPHY: CATH118249

## 2018-01-24 HISTORY — DX: ST elevation (STEMI) myocardial infarction of unspecified site: I21.3

## 2018-01-24 HISTORY — DX: Pure hypercholesterolemia, unspecified: E78.00

## 2018-01-24 LAB — BRAIN NATRIURETIC PEPTIDE: B Natriuretic Peptide: 949 pg/mL — ABNORMAL HIGH (ref 0.0–100.0)

## 2018-01-24 LAB — COMPREHENSIVE METABOLIC PANEL
ALT: 30 U/L (ref 17–63)
ANION GAP: 12 (ref 5–15)
AST: 40 U/L (ref 15–41)
Albumin: 3.8 g/dL (ref 3.5–5.0)
Alkaline Phosphatase: 29 U/L — ABNORMAL LOW (ref 38–126)
BUN: 52 mg/dL — ABNORMAL HIGH (ref 6–20)
CO2: 24 mmol/L (ref 22–32)
CREATININE: 1.82 mg/dL — AB (ref 0.61–1.24)
Calcium: 8.7 mg/dL — ABNORMAL LOW (ref 8.9–10.3)
Chloride: 97 mmol/L — ABNORMAL LOW (ref 101–111)
GFR, EST AFRICAN AMERICAN: 46 mL/min — AB (ref >60)
GFR, EST NON AFRICAN AMERICAN: 40 mL/min — AB (ref >60)
Glucose, Bld: 205 mg/dL — ABNORMAL HIGH (ref 65–99)
POTASSIUM: 4.2 mmol/L (ref 3.5–5.1)
Sodium: 133 mmol/L — ABNORMAL LOW (ref 135–145)
Total Bilirubin: 1.1 mg/dL (ref 0.3–1.2)
Total Protein: 7.8 g/dL (ref 6.5–8.1)

## 2018-01-24 LAB — ECHOCARDIOGRAM COMPLETE
HEIGHTINCHES: 68 in
Weight: 3568 oz

## 2018-01-24 LAB — LIPID PANEL
CHOLESTEROL: 163 mg/dL (ref 0–200)
HDL: 23 mg/dL — ABNORMAL LOW (ref >40)
LDL CALC: 116 mg/dL — AB (ref 0–99)
Total CHOL/HDL Ratio: 7.1 RATIO
Triglycerides: 120 mg/dL (ref <150)
VLDL: 24 mg/dL (ref 0–40)

## 2018-01-24 LAB — PROCALCITONIN: PROCALCITONIN: 0.24 ng/mL

## 2018-01-24 LAB — CBC
HCT: 38 % — ABNORMAL LOW (ref 40.0–52.0)
Hemoglobin: 13.1 g/dL (ref 13.0–18.0)
MCH: 30.4 pg (ref 26.0–34.0)
MCHC: 34.5 g/dL (ref 32.0–36.0)
MCV: 88.3 fL (ref 80.0–100.0)
PLATELETS: 295 10*3/uL (ref 150–440)
RBC: 4.3 MIL/uL — AB (ref 4.40–5.90)
RDW: 13 % (ref 11.5–14.5)
WBC: 10 10*3/uL (ref 3.8–10.6)

## 2018-01-24 LAB — URINE DRUG SCREEN, QUALITATIVE (ARMC ONLY)
AMPHETAMINES, UR SCREEN: NOT DETECTED
BENZODIAZEPINE, UR SCRN: NOT DETECTED
Cannabinoid 50 Ng, Ur ~~LOC~~: NOT DETECTED
Cocaine Metabolite,Ur ~~LOC~~: NOT DETECTED
MDMA (Ecstasy)Ur Screen: NOT DETECTED
METHADONE SCREEN, URINE: NOT DETECTED
OPIATE, UR SCREEN: NOT DETECTED
PHENCYCLIDINE (PCP) UR S: NOT DETECTED
Tricyclic, Ur Screen: NOT DETECTED

## 2018-01-24 LAB — URINALYSIS, COMPLETE (UACMP) WITH MICROSCOPIC
BILIRUBIN URINE: NEGATIVE
Bacteria, UA: NONE SEEN
Glucose, UA: >=500 mg/dL — AB
HGB URINE DIPSTICK: NEGATIVE
KETONES UR: NEGATIVE mg/dL
LEUKOCYTES UA: NEGATIVE
Nitrite: NEGATIVE
PH: 5 (ref 5.0–8.0)
PROTEIN: 30 mg/dL — AB
SQUAMOUS EPITHELIAL / LPF: NONE SEEN (ref 0–5)
Specific Gravity, Urine: 1.021 (ref 1.005–1.030)

## 2018-01-24 LAB — MRSA PCR SCREENING: MRSA BY PCR: NEGATIVE

## 2018-01-24 LAB — PROTIME-INR
INR: 1.19
Prothrombin Time: 15 seconds (ref 11.4–15.2)

## 2018-01-24 LAB — TROPONIN I
Troponin I: 9.15 ng/mL (ref <0.03)
Troponin I: 9.26 ng/mL (ref <0.03)

## 2018-01-24 LAB — GLUCOSE, CAPILLARY
Glucose-Capillary: 142 mg/dL — ABNORMAL HIGH (ref 65–99)
Glucose-Capillary: 97 mg/dL (ref 65–99)

## 2018-01-24 LAB — LIPASE, BLOOD: LIPASE: 26 U/L (ref 11–51)

## 2018-01-24 LAB — APTT: aPTT: 32 seconds (ref 24–36)

## 2018-01-24 LAB — LACTIC ACID, PLASMA: LACTIC ACID, VENOUS: 1.6 mmol/L (ref 0.5–1.9)

## 2018-01-24 SURGERY — LEFT HEART CATH AND CORONARY ANGIOGRAPHY
Anesthesia: Moderate Sedation

## 2018-01-24 MED ORDER — ROSUVASTATIN CALCIUM 10 MG PO TABS
10.0000 mg | ORAL_TABLET | Freq: Every day | ORAL | Status: DC
  Administered 2018-01-24: 10 mg via ORAL
  Filled 2018-01-24: qty 1

## 2018-01-24 MED ORDER — ONDANSETRON HCL 4 MG PO TABS
4.0000 mg | ORAL_TABLET | Freq: Four times a day (QID) | ORAL | Status: DC | PRN
  Filled 2018-01-24: qty 1

## 2018-01-24 MED ORDER — FENTANYL CITRATE (PF) 100 MCG/2ML IJ SOLN
INTRAMUSCULAR | Status: AC
  Filled 2018-01-24: qty 2

## 2018-01-24 MED ORDER — HEPARIN (PORCINE) IN NACL 100-0.45 UNIT/ML-% IJ SOLN
1200.0000 [IU]/h | INTRAMUSCULAR | Status: DC
  Administered 2018-01-24: 1200 [IU]/h via INTRAVENOUS
  Filled 2018-01-24: qty 250

## 2018-01-24 MED ORDER — SODIUM CHLORIDE 0.9 % IV SOLN: INTRAVENOUS | Status: DC

## 2018-01-24 MED ORDER — ALBUTEROL SULFATE (2.5 MG/3ML) 0.083% IN NEBU: 2.5000 mg | INHALATION_SOLUTION | Freq: Four times a day (QID) | RESPIRATORY_TRACT | Status: DC | PRN

## 2018-01-24 MED ORDER — LIDOCAINE HCL (PF) 1 % IJ SOLN
INTRAMUSCULAR | Status: AC
  Filled 2018-01-24: qty 30

## 2018-01-24 MED ORDER — ASPIRIN 81 MG PO CHEW: 81.0000 mg | CHEWABLE_TABLET | ORAL | Status: DC

## 2018-01-24 MED ORDER — MIDAZOLAM HCL 2 MG/2ML IJ SOLN
INTRAMUSCULAR | Status: AC
  Filled 2018-01-24: qty 2

## 2018-01-24 MED ORDER — ACETAMINOPHEN 650 MG RE SUPP: 650.0000 mg | Freq: Four times a day (QID) | RECTAL | Status: DC | PRN

## 2018-01-24 MED ORDER — SODIUM CHLORIDE 0.9 % IV SOLN: 250.0000 mL | INTRAVENOUS | Status: DC | PRN

## 2018-01-24 MED ORDER — NITROGLYCERIN 5 MG/ML IV SOLN
INTRAVENOUS | Status: AC
  Filled 2018-01-24: qty 10

## 2018-01-24 MED ORDER — ACETAMINOPHEN 325 MG PO TABS: 650.0000 mg | ORAL_TABLET | Freq: Four times a day (QID) | ORAL | Status: DC | PRN

## 2018-01-24 MED ORDER — SODIUM CHLORIDE 0.9% FLUSH: 3.0000 mL | INTRAVENOUS | Status: DC | PRN

## 2018-01-24 MED ORDER — SODIUM CHLORIDE 0.9% FLUSH: 3.0000 mL | Freq: Two times a day (BID) | INTRAVENOUS | Status: DC

## 2018-01-24 MED ORDER — HEPARIN SODIUM (PORCINE) 1000 UNIT/ML IJ SOLN
INTRAMUSCULAR | Status: AC
  Filled 2018-01-24: qty 1

## 2018-01-24 MED ORDER — POLYETHYLENE GLYCOL 3350 17 G PO PACK: 17.0000 g | PACK | Freq: Every day | ORAL | Status: DC | PRN

## 2018-01-24 MED ORDER — ONDANSETRON HCL 4 MG/2ML IJ SOLN: 4.0000 mg | Freq: Four times a day (QID) | INTRAMUSCULAR | Status: DC | PRN

## 2018-01-24 MED ORDER — INSULIN ASPART 100 UNIT/ML ~~LOC~~ SOLN: 0.0000 [IU] | Freq: Three times a day (TID) | SUBCUTANEOUS | Status: DC

## 2018-01-24 MED ORDER — ASPIRIN EC 325 MG PO TBEC: 325.0000 mg | DELAYED_RELEASE_TABLET | Freq: Every day | ORAL | Status: DC

## 2018-01-24 MED ORDER — HEPARIN (PORCINE) IN NACL 1000-0.9 UT/500ML-% IV SOLN
INTRAVENOUS | Status: AC
  Filled 2018-01-24: qty 500

## 2018-01-24 MED ORDER — DOCUSATE SODIUM 100 MG PO CAPS
100.0000 mg | ORAL_CAPSULE | Freq: Two times a day (BID) | ORAL | Status: DC
  Administered 2018-01-24: 100 mg via ORAL
  Filled 2018-01-24: qty 1

## 2018-01-24 MED ORDER — HEPARIN (PORCINE) IN NACL 100-0.45 UNIT/ML-% IJ SOLN: 1200.0000 [IU]/h | INTRAMUSCULAR | Status: DC

## 2018-01-24 MED ORDER — HEPARIN (PORCINE) IN NACL 2-0.9 UNITS/ML
INTRAMUSCULAR | Status: DC | PRN
  Administered 2018-01-24: 4000 mL

## 2018-01-24 MED ORDER — VERAPAMIL HCL 2.5 MG/ML IV SOLN
INTRAVENOUS | Status: AC
  Filled 2018-01-24: qty 2

## 2018-01-24 MED ORDER — HEPARIN BOLUS VIA INFUSION
4000.0000 [IU] | Freq: Once | INTRAVENOUS | Status: AC
  Administered 2018-01-24: 4000 [IU] via INTRAVENOUS
  Filled 2018-01-24: qty 4000

## 2018-01-24 MED ORDER — ACETAMINOPHEN 325 MG PO TABS: 650.0000 mg | ORAL_TABLET | ORAL | Status: DC | PRN

## 2018-01-24 MED ORDER — ASPIRIN 81 MG PO CHEW
324.0000 mg | CHEWABLE_TABLET | Freq: Once | ORAL | Status: AC
  Administered 2018-01-24: 324 mg via ORAL

## 2018-01-24 MED ORDER — ACETAMINOPHEN 325 MG PO TABS
650.0000 mg | ORAL_TABLET | Freq: Once | ORAL | Status: AC
  Administered 2018-01-24: 650 mg via ORAL
  Filled 2018-01-24: qty 2

## 2018-01-24 MED ORDER — VERAPAMIL HCL 2.5 MG/ML IV SOLN
INTRAVENOUS | Status: DC | PRN
  Administered 2018-01-24: 2.5 mg via INTRAVENOUS

## 2018-01-24 SURGICAL SUPPLY — 9 items
CATH INFINITI 5FR JK (CATHETERS) ×2 IMPLANT
DEVICE INFLAT 30 PLUS (MISCELLANEOUS) IMPLANT
DEVICE RAD COMP TR BAND LRG (VASCULAR PRODUCTS) ×2 IMPLANT
KIT MANI 3VAL PERCEP (MISCELLANEOUS) ×2 IMPLANT
NEEDLE PERC 21GX4CM (NEEDLE) ×2 IMPLANT
PACK CARDIAC CATH (CUSTOM PROCEDURE TRAY) ×2 IMPLANT
SHEATH RAIN RADIAL 21G 6FR (SHEATH) ×2 IMPLANT
WIRE HITORQ VERSACORE ST 145CM (WIRE) ×2 IMPLANT
WIRE ROSEN-J .035X260CM (WIRE) ×2 IMPLANT

## 2018-01-24 NOTE — ED Notes (Addendum)
Pt transported to Cath Lab and report given at bedside

## 2018-01-24 NOTE — Progress Notes (Signed)
[facility] at the patient's bedside. Face-to-face verbal report given to [place], Therapist, sports with [facility]. Paperwork and imaging disc given to same. Report called to Acquanetta Belling, RN at [facility]. Patient going to room 3316.

## 2018-01-24 NOTE — Consult Note (Signed)
Cardiology Consult    Patient ID: Ricardo Brown MRN: [id number], DOB/AGE: [birth year]   Admit date: 01/24/2018 Date of Consult: 01/24/2018  Primary Physician: [name], MD Primary Cardiologist: Kathlyn Sacramento, MD Requesting Provider: Lenna Sciara. Burlene Arnt, MD  Patient Profile    Ricardo Brown is a 57 y.o. male with a history of HTN, HL, DMII, palpitations, and FH of premature CAD, who is being seen today for the evaluation of inferior STEMI at the request of Dr. Burlene Arnt.  Past Medical History   Past Medical History:  Diagnosis Date  . Diabetes mellitus without complication ([place])    a. Dx ~ 2000.  [name] Heart palpitations    a. Pt reports prior nl echo's and stress tests. Last stress test w/in past 2 yrs - PCP.  [name] High cholesterol     History reviewed. No pertinent surgical history.   Allergies  Allergies  Allergen Reactions  . Lipitor [Atorvastatin] Palpitations    History of Present Illness    57 y/o ? without a prior personal cardiac hx, though he does have a h/o HTN, HL, DMII (x 15 yrs), FH of premature CAD (father had first MI @ 66  later underwent heart transplant), and palpitations.  He was previously followed by [facility] for palpitations, though he says it's been many years since he was last seen.  Palps have been well managed w/  blocker therapy. He also reports prior echo's and stress tests, all wnl.  He was in his usoh until this past Thursday, 6/20, when he was at work and developed weakness, chills, diaphoresis, nausea, and myalgias.  He left work and took the rest of the day off.  On 6/21, he went back to work, but left early due to similar symptoms.  He saw his PCP and was Rx doxycycline for possible tick bite, though pt denies any recent tick exposure.  On the morning of 6/22, he developed chest tightness and dyspnea, which lasted several hours and resolved spontaneously.  He says he took it easy all day Saturday and Sunday.  He continued to  feel weak and was intermittently nauseated.  In that setting, PO intake was poor.  This AM, he walked his garbage can to the curb and developed profound dyspnea.  No c/p.  He walked back into his home and said he felt like he might pass out.  He rested and dyspnea resolved.  His wife later came home and he reported symptoms and so she drove him to the ED.  Here, ECG notable for inferior ST elevation and RBBB w/ antlat ST dep.  He is currently chest pain free.  Inpatient Medications    . heparin  4,000 Units Intravenous Once    Family History    Family History  Problem Relation Age of Onset  . Cancer Mother        died @ 68  . CAD Father        First MI @ 59. S/p heart transplant. Died in mid-50's of cancer.  . Cancer Father   . Other Brother        alive and well   indicated that his mother is deceased. He indicated that his father is deceased. He indicated that his brother is alive.   Social History    Social History   Socioeconomic History  . Marital status: Married    Spouse name: Not on file  . Number of children: Not on file  . Years of education:  Not on file  . Highest education level: Not on file  Occupational History  . Not on file  Social Needs  . Financial resource strain: Not on file  . Food insecurity:    Worry: Not on file    Inability: Not on file  . Transportation needs:    Medical: Not on file    Non-medical: Not on file  Tobacco Use  . Smoking status: Never Smoker  . Smokeless tobacco: Never Used  Substance and Sexual Activity  . Alcohol use: Yes    Comment: rare beer  . Drug use: No  . Sexual activity: Not on file  Lifestyle  . Physical activity:    Days per week: Not on file    Minutes per session: Not on file  . Stress: Not on file  Relationships  . Social connections:    Talks on phone: Not on file    Gets together: Not on file    Attends religious service: Not on file    Active member of club or organization: Not on file    Attends  meetings of clubs or organizations: Not on file    Relationship status: Not on file  . Intimate partner violence:    Fear of current or ex partner: Not on file    Emotionally abused: Not on file    Physically abused: Not on file    Forced sexual activity: Not on file  Other Topics Concern  . Not on file  Social History Narrative   Lives locally with wife.  Works in [employer].  Does not routinely exercise.     Review of Systems    General:  +++ chills, fever, myalgias, no night sweats or weight changes.  Cardiovascular:  +++ chest tightness on Saturday morning (6/22), +++ dyspnea on exertion, chronic, mild ankle edema, no orthopnea, palpitations, paroxysmal nocturnal dyspnea. Dermatological: No rash, lesions/masses Respiratory: No cough, +++ dyspnea Urologic: No hematuria, dysuria Abdominal:   +++ nausea, no vomiting, diarrhea, bright red blood per rectum, melena, or hematemesis Neurologic:  No visual changes, +++ wkns, changes in mental status. All other systems reviewed and are otherwise negative except as noted above.  Physical Exam    Blood pressure 124/79, pulse (!) 103, temperature (!) 100.9 F (38.3 C), temperature source Oral, resp. rate (!) 25, height 5\' 8"  (1.727 m), weight 223 lb (101.2 kg), SpO2 97 %.  General: Pleasant, NAD Psych: Normal affect. Neuro: Alert and oriented X 3. Moves all extremities spontaneously. HEENT: Normal  Neck: Supple without bruits or JVD. Lungs:  Resp regular and unlabored, CTA. Heart: RRR no s3, s4, or murmurs. Abdomen: Soft, non-tender, non-distended, BS + x 4.  Extremities: No clubbing, cyanosis.  Trace bilat ankle edema. DP/PT/Radials 2+ and equal bilaterally.  Labs    Recent Labs    01/24/18 1120  TROPONINI 9.15*   Lab Results  Component Value Date   WBC 10.0 01/24/2018   HGB 13.1 01/24/2018   HCT 38.0 (L) 01/24/2018   MCV 88.3 01/24/2018   PLT 295 01/24/2018    Recent Labs  Lab 01/24/18 1108  NA 133*  K 4.2  CL 97*   CO2 24  BUN 52*  CREATININE 1.82*  CALCIUM 8.7*  PROT 7.8  BILITOT 1.1  ALKPHOS 29*  ALT 30  AST 40  GLUCOSE 205*     Radiology Studies    No results found.  ECG & Cardiac Imaging    RSR, 98, RBBB, inf ST elevation with anterolateral  ST depression.  Assessment & Plan    1. Subacute inferior STEMI: Pt developed flu-like Ss on 6/20, followed by chest tightness and dyspnea on 6/23 with profound dyspnea this AM.  He had wkns, malaise, fever/chills all weekend w/ intermittent nausea.  On presentation to the ED this AM, ECG notable for inferior ST elevation.  Pt currently chest pain free and w/o dyspnea.  Troponin just returned @ 9.15, suggesting late-presenting event, likely starting on Saturday.  Currently hemodynamically stable.  Heparin bolus ordered.  BNP elevated, CXR pending.  Does not appear volume overloaded at this time.  Will hydrate as creat is 1.82 and concern for RV involvement.  Stat echo ordered and discussed w/ echo tech.  Will likely plan on cath this afternoon. The patient understands that risks include but are not limited to stroke (1 in 1000), death (1 in 12), kidney failure [usually temporary] (1 in 500), bleeding (1 in 200), allergic reaction [possibly serious] (1 in 200), and agrees to proceed.    2.  Essential HTN:  Stable.  Hold home ARB.  3.  DM II:  On insulin @ home.  4.  HL:  Cont high potency statin therapy.  5.  Acute renal failure: ? Possible CKD.  BUN/Creat up in setting of 5 day h/o nausea, poor PO intake.  Hydrate as above.  Hold ARB. Limit contrast.  Signed, Murray Hodgkins, NP 01/24/2018, 12:17 PM  For questions or updates, please contact   Please consult [email or web address] for contact info under Cardiology/STEMI.

## 2018-01-24 NOTE — Progress Notes (Signed)
*  PRELIMINARY RESULTS* Echocardiogram 2D Echocardiogram has been performed.  Ricardo Brown 01/24/2018, 1:23 PM

## 2018-01-24 NOTE — ED Triage Notes (Signed)
Has been treated for a tick bite with antibiotics since Friday (Doxycycline).  C/O SOB and feeling gassy since Friday.  Decreased appetite.  Ceneis c/o pain.  C/o "tightness in my belly".

## 2018-01-24 NOTE — ED Notes (Signed)
Add on lab per Broadus John in [employer]

## 2018-01-24 NOTE — H&P (Signed)
Ricardo Brown    MR#:  [id number]  DATE OF BIRTH:  [birth year]  DATE OF ADMISSION:  01/24/2018  PRIMARY CARE PHYSICIAN: [name], MD   REQUESTING/REFERRING PHYSICIAN: Dr. Burlene Arnt  CHIEF COMPLAINT:  presented with fever nausea vomiting diarrhea since Saturday along with shortness of breath. wife and daughter in the room.  HISTORY OF PRESENT ILLNESS:  Ricardo Brown  is a 57 y.o. male with a known history of diabetes on insulin, hyperlipidemia, hypertension comes to the emergency room with generalized weakness fever since Saturday along with nausea vomiting feeling very weak and diarrhea. Patient had fever of 102.9 at home. He also started having shortness of breath along with orthopnea for last couple days. Denies any chest pain. Denies any abdominal pain dysuria or headache.  Patient was found to be hemodynamically stable however he had a fever of 100.9. White count is normal. He also was found to have B natriuretic peptide of 949. EKG showed ST elevation in inferior lead with sinus rhythm and right bundle branch block. Code STEMI was initiated. Read as outpatient in the ER. Stat echo showed inferior wall hypo kinesis. Troponin came back at 9.1 it seems patient had MI when he felt sick initially on Saturday and appears to be delayed presentation at present. Given the presentation patient is going to the Cath Lab for cardiac catheterization  He will be admitted to the ICU thereafter. Spoke With Dr. Celesta Aver ICU attending PAST MEDICAL HISTORY:   Past Medical History:  Diagnosis Date  . Diabetes mellitus without complication ([place])    a. Dx ~ 2000.  [name] Heart palpitations    a. Pt reports prior nl echo's and stress tests. Last stress test w/in past 2 yrs - PCP.  [name] High cholesterol     PAST SURGICAL HISTOIRY:  History reviewed. No pertinent surgical history.  SOCIAL HISTORY:   Social History    Tobacco Use  . Smoking status: Never Smoker  . Smokeless tobacco: Never Used  Substance Use Topics  . Alcohol use: Yes    Comment: rare beer    FAMILY HISTORY:   Family History  Problem Relation Age of Onset  . Cancer Mother        died @ 8  . CAD Father        First MI @ 22. S/p heart transplant. Died in mid-50's of cancer.  . Cancer Father   . Other Brother        alive and well    DRUG ALLERGIES:   Allergies  Allergen Reactions  . Lipitor [Atorvastatin] Palpitations    REVIEW OF SYSTEMS:  Review of Systems  Constitutional: Positive for chills, fever and malaise/fatigue. Negative for weight loss.  HENT: Negative for ear discharge, ear pain and nosebleeds.   Eyes: Negative for blurred vision, pain and discharge.  Respiratory: Positive for shortness of breath. Negative for sputum production, wheezing and stridor.   Cardiovascular: Negative for chest pain, palpitations, orthopnea and PND.  Gastrointestinal: Negative for abdominal pain, diarrhea, nausea and vomiting.  Genitourinary: Negative for frequency and urgency.  Musculoskeletal: Negative for back pain and joint pain.  Neurological: Positive for weakness. Negative for sensory change, speech change and focal weakness.  Psychiatric/Behavioral: Negative for depression and hallucinations. The patient is not nervous/anxious.      MEDICATIONS AT HOME:   Prior to Admission medications   Medication Sig Start Date End Date Taking? Authorizing Provider  amLODipine (  NORVASC) 5 MG tablet Take 5 mg by mouth daily. 01/17/18  Yes [provider]  doxycycline (VIBRAMYCIN) 100 MG capsule Take 100 mg by mouth 2 (two) times daily. 01/21/18  Yes [provider]  Fenofibrate 120 MG TABS Take 1 tablet by mouth daily. 01/20/18  Yes [provider]  fluticasone (FLONASE) 50 MCG/ACT nasal spray Place 2 sprays into both nostrils 2 (two) times daily.   Yes [provider]  GLIPIZIDE XL 5 MG 24 hr  tablet Take 1 tablet by mouth daily. 12/19/17  Yes [provider]  glyBURIDE-metformin (GLUCOVANCE) 2.5-500 MG tablet Take 1 tablet by mouth 2 (two) times daily. 12/02/17  Yes [provider]  insulin NPH-regular Human (NOVOLIN 70/30) (70-30) 100 UNIT/ML injection Inject 30 Units into the skin 2 (two) times daily.   Yes [provider]  loratadine (CLARITIN) 10 MG tablet Take 10 mg by mouth daily.   Yes [provider]  losartan (COZAAR) 100 MG tablet Take 100 mg by mouth daily. 01/17/18  Yes [provider]  naproxen sodium (ANAPROX) 220 MG tablet Take 440 mg by mouth 2 (two) times daily as needed (for headache).   Yes [provider]  Pramipexole Dihydrochloride 1.5 MG TB24 Take 1 tablet by mouth daily. 11/22/17  Yes [provider]  rosuvastatin (CRESTOR) 5 MG tablet Take 5 mg by mouth daily. 01/19/18  Yes [provider]  TOUJEO SOLOSTAR 300 UNIT/ML SOPN Inject 5 Units into the skin daily. 01/14/18  Yes [provider]  blood glucose meter kit and supplies KIT Dispense based on patient and insurance preference. Use up to four times daily as directed. (FOR ICD-9 250.00, 250.01). 04/23/15   [name], MD  metFORMIN (GLUCOPHAGE) 500 MG tablet Take 1 tablet (500 mg total) by mouth 2 (two) times daily with a meal. 04/23/15 04/22/16  [name], MD      VITAL SIGNS:  Blood pressure 120/74, pulse 97, temperature 99 F (37.2 C), temperature source Oral, resp. rate (!) 32, height 5' 8" (1.727 m), weight 101.2 kg (223 lb), SpO2 93 %.  PHYSICAL EXAMINATION:  GENERAL:  57 y.o.-year-old patient lying in the bed with no acute distress.  EYES: Pupils equal, round, reactive to light and accommodation. No scleral icterus. Extraocular muscles intact.  HEENT: Head atraumatic, normocephalic. Oropharynx and nasopharynx clear.  NECK:  Supple, no jugular venous distention. No thyroid enlargement, no tenderness.  LUNGS: decreased  breath sounds bilaterally, no wheezing, rales,rhonchi or crepitation. No use of accessory muscles of respiration.  CARDIOVASCULAR: S1, S2 normal. No murmurs, rubs, or gallops.  ABDOMEN: Soft, nontender, nondistended. Bowel sounds present. No organomegaly or mass.  EXTREMITIES: No pedal edema, cyanosis, or clubbing.  NEUROLOGIC: Cranial nerves II through XII are intact. Muscle strength 5/5 in all extremities. Sensation intact. Gait not checked.  PSYCHIATRIC: The patient is alert and oriented x 3.  SKIN: No obvious rash, lesion, or ulcer.   LABORATORY PANEL:   CBC Recent Labs  Lab 01/24/18 1108  WBC 10.0  HGB 13.1  HCT 38.0*  PLT 295   ------------------------------------------------------------------------------------------------------------------  Chemistries  Recent Labs  Lab 01/24/18 1108  NA 133*  K 4.2  CL 97*  CO2 24  GLUCOSE 205*  BUN 52*  CREATININE 1.82*  CALCIUM 8.7*  AST 40  ALT 30  ALKPHOS 29*  BILITOT 1.1   ------------------------------------------------------------------------------------------------------------------  Cardiac Enzymes Recent Labs  Lab 01/24/18 1120  TROPONINI 9.15*   ------------------------------------------------------------------------------------------------------------------  RADIOLOGY:  Dg Chest  Port 1 View  Result Date: 01/24/2018 CLINICAL DATA:  Shortness of breath. EXAM: PORTABLE CHEST 1 VIEW COMPARISON:  Chest x-ray dated Dec 27, 2008. FINDINGS: The heart size and mediastinal contours are within normal limits. Normal pulmonary vascularity. Patchy right infrahilar opacity. No pleural effusion or pneumothorax. No acute osseous abnormality. IMPRESSION: 1. Patchy right infrahilar opacity is favored to reflect atelectasis, less likely pneumonia. Electronically Signed   By: Titus Dubin M.D.   On: 01/24/2018 12:30    EKG:  sinus rhythm with ST- T elevation inferior lead and right bundle branch block IMPRESSION AND PLAN:    Ricardo Brown  is a 57 y.o. male with a known history of diabetes on insulin, hyperlipidemia, hypertension comes to the emergency room with generalized weakness fever since Saturday along with nausea vomiting feeling very weak and diarrhea. Patient had fever of 102.9 at home. He also started having shortness of breath along with orthopnea for last couple days  1. ST EMI inferior wall congestive heart failure acute EF unknown -admit to ICU -echo showed inferior wall hypo kinesis the emergency room along with elevated troponin of 9.14 -started on IV heparin drip, aspirin and statins -Dr. Tyrell Antonio outpatient patient going to cardiac cath  2. hyperlipidemia continue statins  3. Febrile illness appears viral syndrome for now-- patient presented with nausea vomiting and diarrhea -Pro calcitonin pending. Lactic acid pending -she has normal white count -holding off antibiotics at present -monitor fever curve treat accordingly  4. Acute renal insufficiency suspected due to dehydration from G.I. Losses -s creatinine in 2016 was 1.2 -maintenance low IV fluid -avoid nephrotoxins  5. Diabetes type II -sliding scale insulin  Patient is a full code  discussed with daughter and wife in the room  All the records are reviewed and case discussed with ED provider. Management plans discussed with the patient, family and they are in agreement.  CODE STATUS: full  TOTAL critical TIME TAKING CARE OF THIS PATIENT: *55* minutes.    Ricardo Brown M.D on 01/24/2018 at 1:23 PM  Between 7am to 6pm - Pager - [phone]  After 6pm go to [email or web address] - password [email or web address] [facility]  [facility]  Office  [phone]  CC: Primary care physician; [name], MD

## 2018-01-24 NOTE — Progress Notes (Signed)
[facility] for heparin drip management  Indication: STEMI   Allergies  Allergen Reactions  . Lipitor [Atorvastatin] Palpitations    Patient Measurements: Height: 5\' 8"  (172.7 cm) Weight: 220 lb 14.4 oz (100.2 kg) IBW/kg (Calculated) : 68.4 Heparin Dosing Weight: 90.2kg   Vital Signs: Temp: 98.7 F (37.1 C) (06/24 1443) Temp Source: Oral (06/24 1443) BP: 104/70 (06/24 1445) Pulse Rate: 83 (06/24 1500)  Labs: Recent Labs    01/24/18 1108 01/24/18 1120 01/24/18 1459  HGB 13.1  --   --   HCT 38.0*  --   --   PLT 295  --   --   APTT  --  32  --   LABPROT  --  15.0  --   INR  --  1.19  --   CREATININE 1.82*  --   --   TROPONINI  --  9.15* 9.26*    Estimated Creatinine Clearance: 52 mL/min (A) (by C-G formula based on SCr of 1.82 mg/dL (H)).   Medical History: Past Medical History:  Diagnosis Date  . Diabetes mellitus without complication ([place])    a. Dx ~ 2000.  [name] Heart palpitations    a. Pt reports prior nl echo's and stress tests. Last stress test w/in past 2 yrs - PCP.  [name] High cholesterol     Medications:  Scheduled:  . aspirin EC  325 mg Oral Daily  . docusate sodium  100 mg Oral BID  . heparin      . insulin aspart  0-9 Units Subcutaneous TID WC  . rosuvastatin  10 mg Oral Daily  . sodium chloride flush  3 mL Intravenous Q12H   Infusions:  . sodium chloride    . heparin      Assessment: Pharmacy consulted for heparin drip management for 57 yo male admitted to the ICU with STEMI from cath lab. Per order heparin drip to resume 8 hours post sheath removal (1411). Patient receiving heparin 1200 unit/hr prior to cath lab.    Goal of Therapy:  Heparin level 0.3-0.7 units/ml Monitor platelets by anticoagulation protocol: Yes   Plan:  Will reinitiate heparin drip at 1200 units/hr at 2215. Will obtain heparin level at 0430.   Pharmacy will continue to monitor and adjust per consult.   Simpson,Michael  L 01/24/2018,4:41 PM

## 2018-01-24 NOTE — ED Provider Notes (Addendum)
[facility] Emergency Department Provider Note  ____________________________________________   I have reviewed the triage vital signs and the nursing notes. Where available I have reviewed prior notes and, if possible and indicated, outside hospital notes.    HISTORY  Chief Complaint No chief complaint on file.    HPI Ricardo Brown is a 57 y.o. male story of diabetes mellitus which he used to be poorly controlled however his most recently hemoglobin A1c was in the 5-6 range, history of SVT but no other cardiac history history of negative stress test "a few years ago", states that he has been feeling unwell for the last 3 or 4 days.  Started on Thursday, felt like he had the flu.  Subjective fevers at home, nausea and vomiting over the last couple days most recent vomitus was this morning.  Diarrhea, no melena no bright red blood per rectum no hematemesis.  Patient states he has a gassy feeling in his stomach which was relieved by the vomiting.  That feeling is been there uninterruptedly since Thursday but it gets better when he throws up.  He denies any focal abdominal pain.  He states he just feels little "gassy".  He denies any chest pain.  He states he has been coughing "a great deal" and it is mostly dry coughing.  He has sinus congestion.  He states that he has no dysuria or urinary frequency.  He has had no chest pain.  He went to his primary care doctor and was given doxycycline because he has a history of tick bites, doxycycline does not seem to have made him better.  His gas discomfort is only worse when he eats, it is better when he throws up, it is not exertional.  He does have a cough and shortness of breath as well, he is been having trouble sleeping as a result of the cough and shortness of breath.   Past Medical History:  Diagnosis Date  . Diabetes mellitus without complication ([place])   . Heart palpitations   . High cholesterol     There are no  active problems to display for this patient.   History reviewed. No pertinent surgical history.  Prior to Admission medications   Medication Sig Start Date End Date Taking? Authorizing Provider  blood glucose meter kit and supplies KIT Dispense based on patient and insurance preference. Use up to four times daily as directed. (FOR ICD-9 250.00, 250.01). 04/23/15   [name], MD  fluticasone (FLONASE) 50 MCG/ACT nasal spray Place 2 sprays into both nostrils 2 (two) times daily.    [provider]  loratadine (CLARITIN) 10 MG tablet Take 10 mg by mouth daily.    [provider]  metFORMIN (GLUCOPHAGE) 500 MG tablet Take 1 tablet (500 mg total) by mouth 2 (two) times daily with a meal. 04/23/15 04/22/16  [name], MD  naproxen sodium (ANAPROX) 220 MG tablet Take 440 mg by mouth 2 (two) times daily as needed (for headache).    [provider]    Allergies Lipitor [atorvastatin]  No family history on file.  Social History Social History   Tobacco Use  . Smoking status: Never Smoker  . Smokeless tobacco: Never Used  Substance Use Topics  . Alcohol use: No  . Drug use: No    Review of Systems Constitutional: fever Eyes: No visual changes. ENT: No sore throat. No stiff neck no neck pain Cardiovascular: Denies chest pain. Respiratory: See HPI Gastrointestinal: See  HPI Genitourinary: Negative for dysuria. Musculoskeletal: Negative lower extremity swelling Skin: Negative for rash. Neurological: Negative for severe headaches, focal weakness or numbness.   ____________________________________________   PHYSICAL EXAM:  VITAL SIGNS: ED Triage Vitals  Enc Vitals Group     BP 01/24/18 1107 110/74     Pulse Rate 01/24/18 1107 97     Resp 01/24/18 1107 (!) 22     Temp 01/24/18 1107 (!) 100.9 F (38.3 C)     Temp Source 01/24/18 1107 Oral     SpO2 01/24/18 1107 97 %     Weight 01/24/18 1108 223 lb (101.2 kg)     Height 01/24/18 1108 5' 8"  (1.727 m)     Head Circumference --      Peak Flow --      Pain Score 01/24/18 1115 0     Pain Loc --      Pain Edu? --      Excl. in GC? --     Constitutional: Alert and oriented. Well appearing and in no acute distress.  he is absolutely nontoxic in appearance chatting with me in no acute distress Eyes: Conjunctivae are normal Head: Atraumatic HEENT: No congestion/rhinnorhea. Mucous membranes are dry.  Oropharynx non-erythematous Neck:   Nontender with no meningismus, no masses, no stridor Cardiovascular: Normal rate, regular rhythm. Grossly normal heart sounds.  Good peripheral circulation. Respiratory: Normal respiratory effort.  No retractions. Lungs CTAB. Abdominal: Soft and nontender. No distention. No guarding no rebound Back:  There is no focal tenderness or step off.  there is no midline tenderness there are no lesions noted. there is no CVA tenderness Musculoskeletal: No lower extremity tenderness, no upper extremity tenderness. No joint effusions, no DVT signs strong distal pulses no edema Neurologic:  Normal speech and language. No gross focal neurologic deficits are appreciated.  Skin:  Skin is warm, dry and intact. No rash noted. Psychiatric: Mood and affect are normal. Speech and behavior are normal.  ____________________________________________   LABS (all labs ordered are listed, but only abnormal results are displayed)  Labs Reviewed  LIPASE, BLOOD  COMPREHENSIVE METABOLIC PANEL  CBC  URINALYSIS, COMPLETE (UACMP) WITH MICROSCOPIC  TROPONIN I  PROTIME-INR  APTT  BRAIN NATRIURETIC PEPTIDE    Pertinent labs  results that were available during my care of the patient were reviewed by me and considered in my medical decision making (see chart for details). ____________________________________________  EKG  I personally interpreted any EKGs ordered by me or triage Right bundle branch block, 2 different EKGs were obtained, one had artifact, the first 1 done at  11:12 AM shows right bundle branch block which is old, sinus rhythm rate 98, ST elevation in lead Brown, and ST changes laterally, ST depression noted V1 V2.    Second EKG is unchanged weight 102 otherwise ST elevation still perceived in 3 and aVF, with ST depression noted in V1 V2 Old EKGs show that the ST changes in V1 V2 are old but the ST changes in lead Brown and aVF do not appear to be old. ____________________________________________  RADIOLOGY  Pertinent labs & imaging results that were available during my care of the patient were reviewed by me and considered in my medical decision making (see chart for details). If possible, patient and/or family made aware of any abnormal findings.  No results found. ____________________________________________    PROCEDURES  Procedure(s) performed: None  Procedures  Critical Care performed:CRITICAL CARE Performed by: JAMES A MCSHANE   Total critical care   time: 68 minutes  Critical care time was exclusive of separately billable procedures and treating other patients.  Critical care was necessary to treat or prevent imminent or life-threatening deterioration.  Critical care was time spent personally by me on the following activities: development of treatment plan with patient and/or surrogate as well as nursing, discussions with consultants, evaluation of patient's response to treatment, examination of patient, obtaining history from patient or surrogate, ordering and performing treatments and interventions, ordering and review of laboratory studies, ordering and review of radiographic studies, pulse oximetry and re-evaluation of patient's condition.   ____________________________________________   INITIAL IMPRESSION / ASSESSMENT AND PLAN / ED COURSE  Pertinent labs & imaging results that were available during my care of the patient were reviewed by me and considered in my medical decision making (see chart for  details).  ----------------------------------------- 11:19 AM on 01/24/2018 ----------------------------------------- Paging dr. Arida, cards. Giving asa. Getting iv.   ----------------------------------------- 11:24 AM on 01/24/2018 -----------------------------------------  Discussing w/ dr. Arida, on-call interventionalist cardiologist, he evaluated ecg we discussed patient history, physical,  Vital signs, and results thus far, Dr. Arida did not feel given patient history and EKG that he wanted me to activate STEMI, which we will therefore defer, he does not want to take the patient to the Cath Lab immediately at this point.  He will come down and evaluate the patient however or his team will.  he does not have chest pain, blood work is pending.   ----------------------------------------- 11:51 AM on 01/24/2018 -----------------------------------------  Decision making, patient here with URI, viral syndrome symptoms including vomiting, diarrhea, cough runny nose and fever, \nontoxic nothing to suggest meningitis  abdomen is completely benign, reassuring presentation aside from some element of dehydration however, his EKG is very concerning to me.  For this reason I did call cardiology.  As the patient does not have any chest pain, I deferred calling code STEMI especially as he is been having symptoms since Thursday but I did want cardiology to look at the EKG immediately.  I do appreciate cardiology consult.  They do not feel given history EKG findings and results thus far that the patient should go emergently to the Cath Lab.  At this time the patient has no complaints or symptoms.  Troponin is pending.  Chest x-ray is pending.  Given him aspirin, based only upon his EKG findings, we will stay in close contact with cardiology based on EKG findings.  ----------------------------------------- 12:03 PM on 01/24/2018 ----------------------------------------- Remains symptomatic at this time  which is reassuring is been evaluated by cardiology PA, they are deciding what they wish to do at this time creatinine is somewhat up BNP is also significantly elevated, especially for patient with no history of heart failure.  Chest x-ray is still pending, has been obtained but has not crossed over.  We are going to start heparin after conversation with cardiology even though he is a symptomatic at this time.  After talking cardiology he states he may have had "some chest tightness may be" on Saturday, but none since then.  We will repeat EKG at this time, further disposition based on cardiology input  ----------------------------------------- 12:16 PM on 01/24/2018 -----------------------------------------  Still unable to see x-ray however discussed with radiology, they do say pulmonary vascular congestion, patient still without chest pain, obviously given creatinine would like to give him fluid but given on new onset CHF we do not, troponin is elevated at 9 suggest that the patient probably had a myocardial infarction a few   days ago.    ----------------------------------------- 12:19 PM on 01/24/2018 -----------------------------------------  Discussed again with Dr. Fletcher Anon after troponin findings he will come evaluate the patient, no change in management at this time, very much appreciate the consult.  Repeat EKG shows continued ST elevation in inferior leads  ----------------------------------------- 12:51 PM on 01/24/2018 -----------------------------------------  Getting echo. No cp no sob no complaints.    ____________________________________________   FINAL CLINICAL IMPRESSION(S) / ED DIAGNOSES  Final diagnoses:  SOB (shortness of breath)      This chart was dictated using voice recognition software.  Despite best efforts to proofread,  errors can occur which can change meaning.      [name], MD 01/24/18 1152    [name], MD 01/24/18 1216     [name], MD 01/24/18 1221    [name], MD 01/24/18 1224    [name], MD 01/24/18 1251

## 2018-01-24 NOTE — ED Notes (Signed)
Date and time results received: 01/24/18 1215 (use smartphrase ".now" to insert current time)  Test: troponin Critical Value: 9.15  Name of Provider Notified: Mcshane  Orders Received? Or Actions Taken?: Orders Received - See Orders for details

## 2018-01-24 NOTE — Progress Notes (Signed)
ANTICOAGULATION CONSULT NOTE - Initial Consult  Pharmacy Consult for heparin Indication: chest pain/ACS  Allergies  Allergen Reactions  . Lipitor [Atorvastatin] Palpitations    Patient Measurements: Height: 5\' 8"  (172.7 cm) Weight: 223 lb (101.2 kg) IBW/kg (Calculated) : 68.4 Heparin Dosing Weight: 90.2kg  Vital Signs: Temp: 100.9 F (38.3 C) (06/24 1107) Temp Source: Oral (06/24 1107) BP: 124/79 (06/24 1145) Pulse Rate: 103 (06/24 1145)  Labs: Recent Labs    01/24/18 1108 01/24/18 1120  HGB 13.1  --   HCT 38.0*  --   PLT 295  --   APTT  --  32  LABPROT  --  15.0  INR  --  1.19  CREATININE 1.82*  --     Estimated Creatinine Clearance: 52.2 mL/min (A) (by C-G formula based on SCr of 1.82 mg/dL (H)).   Medical History: Past Medical History:  Diagnosis Date  . Diabetes mellitus without complication ([place])    a. Dx ~ 2000.  [name] Heart palpitations   . High cholesterol     Medications:   (Not in a hospital admission) Scheduled:  . heparin  4,000 Units Intravenous Once   Infusions:  . heparin     PRN:  Anti-infectives (From admission, onward)   None      Assessment: 57 year old male requiring heparin for ACS. Not on ac PTA   Goal of Therapy:  Heparin level 0.3-0.7 units/ml Monitor platelets by anticoagulation protocol: Yes   Plan:  Give 4000 units bolus x 1 Start heparin infusion at 1200 units/hr Check anti-Xa level in 6 hours and daily while on heparin Continue to monitor H&H and platelets  Donna Christen Ceira Hoeschen 01/24/2018,12:09 PM

## 2018-01-24 NOTE — ED Notes (Signed)
Called and spoke with Pharmacy and they stated that they just sent up heparin

## 2018-01-24 NOTE — ED Notes (Signed)
Pt reports increased shortness of breath since yesterday, weakness, congestion, loss of appetite, vomiting, diarrhea (last was yesterday x2-3 times), and cough at home since Thursday. Denies chest pain at this time. Dry heaving this morning. C/o gas-type pain in stomach. No diarrhea prior to being placed on doxycycline for tick bite last week.   Pt states that he has been feeling like he is "just losing [his] breath... Like someone with sleep apnea" since yesterday.

## 2018-01-24 NOTE — ED Notes (Signed)
ED Provider at bedside. 

## 2018-01-24 NOTE — Consult Note (Signed)
Ricardo Brown Consultation      Name: Ricardo Brown MRN: [id number] DOB: Oct 16, 1960    ADMISSION DATE:  01/24/2018 CONSULTATION DATE:  01/24/2018  REFERRING MD :  [name], MD   CHIEF COMPLAINT:     Shortness of breath   HISTORY OF PRESENT ILLNESS  This 57 year old gentleman  was in his usoh until this past Thursday, 6/20, when he was at work and developed weakness, chills, diaphoresis, nausea, and myalgias.  He left work and took the rest of the day off.  On 6/21, he went back to work, but left early due to similar symptoms.  He saw his PCP and was Rx doxycycline for possible tick bite, though pt denies any recent tick exposure.  On the morning of 6/22, he developed chest tightness and dyspnea, which lasted several hours and resolved spontaneously.  He says he took it easy all day Saturday and Sunday.  He continued to feel weak and was intermittently nauseated.  In that setting, PO intake was poor.  This morning, the patient took his rubbish bin to the curb and developed profound dyspnea.  No c/p.  He walked back into his home and said he felt like he might pass out.  He rested and dyspnea resolved.  His wife later came home and he reported symptoms and so she drove him to the ED.  Here, ECG notable for inferior ST elevation and RBBB w/ antlat ST dep.  He is currently chest pain free.     SIGNIFICANT EVENTS   6/24 Admitted to ICU, Cardiac cath    PAST MEDICAL HISTORY    :  Past Medical History:  Diagnosis Date  . Diabetes mellitus without complication ([place])    a. Dx ~ 2000.  [name] Heart palpitations    a. Pt reports prior nl echo's and stress tests. Last stress test w/in past 2 yrs - PCP.  [name] High cholesterol    Past Surgical History:  Procedure Laterality Date  . LEFT HEART CATH AND CORONARY ANGIOGRAPHY N/A 01/24/2018   Procedure: LEFT HEART CATH AND CORONARY ANGIOGRAPHY;  Surgeon: [name], MD;  Location: [facility];   Service: Cardiovascular;  Laterality: N/A;   Prior to Admission medications   Medication Sig Start Date End Date Taking? Authorizing Provider  amLODipine (NORVASC) 5 MG tablet Take 5 mg by mouth daily. 01/17/18  Yes [provider]  doxycycline (VIBRAMYCIN) 100 MG capsule Take 100 mg by mouth 2 (two) times daily. 01/21/18  Yes [provider]  Fenofibrate 120 MG TABS Take 1 tablet by mouth daily. 01/20/18  Yes [provider]  fluticasone (FLONASE) 50 MCG/ACT nasal spray Place 2 sprays into both nostrils 2 (two) times daily.   Yes [provider]  GLIPIZIDE XL 5 MG 24 hr tablet Take 1 tablet by mouth daily. 12/19/17  Yes [provider]  glyBURIDE-metformin (GLUCOVANCE) 2.5-500 MG tablet Take 1 tablet by mouth 2 (two) times daily. 12/02/17  Yes [provider]  insulin NPH-regular Human (NOVOLIN 70/30) (70-30) 100 UNIT/ML injection Inject 30 Units into the skin 2 (two) times daily.   Yes [provider]  loratadine (CLARITIN) 10 MG tablet Take 10 mg by mouth daily.   Yes [provider]  losartan (COZAAR) 100 MG tablet Take 100 mg by mouth daily. 01/17/18  Yes [provider]  naproxen sodium (ANAPROX) 220 MG tablet Take 440 mg by mouth 2 (two) times daily as needed (for  headache).   Yes [provider]  Pramipexole Dihydrochloride 1.5 MG TB24 Take 1 tablet by mouth daily. 11/22/17  Yes [provider]  rosuvastatin (CRESTOR) 5 MG tablet Take 5 mg by mouth daily. 01/19/18  Yes [provider]  TOUJEO SOLOSTAR 300 UNIT/ML SOPN Inject 5 Units into the skin daily. 01/14/18  Yes [provider]  blood glucose meter kit and supplies KIT Dispense based on patient and insurance preference. Use up to four times daily as directed. (FOR ICD-9 250.00, 250.01). 04/23/15   [name], MD  metFORMIN (GLUCOPHAGE) 500 MG tablet Take 1 tablet (500 mg total) by mouth 2 (two) times daily with a meal.  04/23/15 04/22/16  [name], MD   Allergies  Allergen Reactions  . Lipitor [Atorvastatin] Palpitations     FAMILY HISTORY   Family History  Problem Relation Age of Onset  . Cancer Mother        died @ 21  . CAD Father        First MI @ 15. S/p heart transplant. Died in mid-50's of cancer.  . Cancer Father   . Other Brother        alive and well      SOCIAL HISTORY    reports that he has never smoked. He has never used smokeless tobacco. He reports that he drinks alcohol. He reports that he does not use drugs.  ROS Constitutional: Positive for chills, fever and malaise/fatigue. Negative for weight loss.  HENT: Negative for ear discharge, ear pain and nosebleeds.   Eyes: Negative for blurred vision, pain and discharge.  Respiratory: Positive for shortness of breath. Negative for sputum production, wheezing and stridor.   Cardiovascular: Negative for chest pain, palpitations, orthopnea and PND.  Gastrointestinal: Negative for abdominal pain, +diarrhea, +nausea and vomiting.  Genitourinary: Negative for frequency and urgency.  Musculoskeletal: Negative for back pain and joint pain.  Neurological: Positive for weakness. Negative for sensory change, speech change and focal weakness.  Psychiatric/Behavioral: Negative for depression and hallucinations. The patient is not nervous/anxious.        VITAL SIGNS    Temp:  [98.7 F (37.1 C)-100.9 F (38.3 C)] 98.7 F (37.1 C) (06/24 1443) Pulse Rate:  [29-103] 83 (06/24 1500) Resp:  [22-35] 25 (06/24 1500) BP: (104-124)/(67-79) 104/70 (06/24 1445) SpO2:  [70 %-98 %] 92 % (06/24 1445) Weight:  [220 lb 14.4 oz (100.2 kg)-223 lb (101.2 kg)] 220 lb 14.4 oz (100.2 kg) (06/24 1443) HEMODYNAMICS:   VENTILATOR SETTINGS:   INTAKE / OUTPUT: No intake or output data in the 24 hours ending 01/24/18 1600     PHYSICAL EXAM     GENERAL:  patient lying in the bed with no acute distress.  EYES: Pupils equal, round,  reactive to light and accommodation. No scleral icterus. Extraocular muscles intact.  HEENT: Head atraumatic, normocephalic. Oropharynx and nasopharynx clear.  NECK:  Supple, no jugular venous distention. No thyroid enlargement, no tenderness.  LUNGS: decreased breath sounds bilaterally, no wheezing, rales,rhonchi or crepitation. No use of accessory muscles of respiration.  CARDIOVASCULAR: S1, S2 normal. No murmurs, rubs, or gallops.  ABDOMEN: Soft, nontender, nondistended. Bowel sounds present. No organomegaly or mass.  EXTREMITIES: No pedal edema, cyanosis, or clubbing.  NEUROLOGIC: Cranial nerves II through XII are intact. Muscle strength 5/5 in all extremities. Sensation intact. Gait not checked.  PSYCHIATRIC: The patient is alert and oriented x 3.  SKIN: No obvious rash, lesion, or ulcer.  LABS   LABS:  CBC Recent Labs  Lab 01/24/18 1108  WBC 10.0  HGB 13.1  HCT 38.0*  PLT 295   Coag's Recent Labs  Lab 01/24/18 1120  APTT 32  INR 1.19   BMET Recent Labs  Lab 01/24/18 1108  NA 133*  K 4.2  CL 97*  CO2 24  BUN 52*  CREATININE 1.82*  GLUCOSE 205*   Electrolytes Recent Labs  Lab 01/24/18 1108  CALCIUM 8.7*   Sepsis Markers Recent Labs  Lab 01/24/18 1459  LATICACIDVEN 1.6   ABG No results for input(s): PHART, PCO2ART, PO2ART in the last 168 hours. Liver Enzymes Recent Labs  Lab 01/24/18 1108  AST 40  ALT 30  ALKPHOS 29*  BILITOT 1.1  ALBUMIN 3.8   Cardiac Enzymes Recent Labs  Lab 01/24/18 1120 01/24/18 1459  TROPONINI 9.15* 9.26*   Glucose Recent Labs  Lab 01/24/18 1442  GLUCAP 142*     No results found for this or any previous visit (from the past 240 hour(s)).   Current Facility-Administered Medications:  .  0.9 %  sodium chloride infusion, 250 mL, Intravenous, PRN, Fletcher Anon, Muhammad A, MD .  acetaminophen (TYLENOL) tablet 650 mg, 650 mg, Oral, Q6H PRN **OR** acetaminophen (TYLENOL) suppository 650 mg, 650 mg, Rectal,  Q6H PRN, [name], MD .  acetaminophen (TYLENOL) tablet 650 mg, 650 mg, Oral, Q4H PRN, [name], MD .  albuterol (PROVENTIL) (2.5 MG/3ML) 0.083% nebulizer solution 2.5 mg, 2.5 mg, Nebulization, Q6H PRN, [name], MD .  aspirin EC tablet 325 mg, 325 mg, Oral, Daily, Arida, Muhammad A, MD .  docusate sodium (COLACE) capsule 100 mg, 100 mg, Oral, BID, [name], MD, 100 mg at 01/24/18 1510 .  heparin 1000 UNIT/ML injection, , , ,  .  insulin aspart (novoLOG) injection 0-9 Units, 0-9 Units, Subcutaneous, TID WC, Posey Pronto, Sona, MD .  ondansetron (ZOFRAN) tablet 4 mg, 4 mg, Oral, Q6H PRN **OR** ondansetron (ZOFRAN) injection 4 mg, 4 mg, Intravenous, Q6H PRN, [name], MD .  ondansetron (ZOFRAN) injection 4 mg, 4 mg, Intravenous, Q6H PRN, Arida, Muhammad A, MD .  polyethylene glycol (MIRALAX / GLYCOLAX) packet 17 g, 17 g, Oral, Daily PRN, [name], MD .  rosuvastatin (CRESTOR) tablet 10 mg, 10 mg, Oral, Daily, Arida, Muhammad A, MD, 10 mg at 01/24/18 1510 .  sodium chloride flush (NS) 0.9 % injection 3 mL, 3 mL, Intravenous, Q12H, Arida, Muhammad A, MD .  sodium chloride flush (NS) 0.9 % injection 3 mL, 3 mL, Intravenous, PRN, [name], MD  IMAGING    Dg Chest Port 1 View  Result Date: 01/24/2018 CLINICAL DATA:  Shortness of breath. EXAM: PORTABLE CHEST 1 VIEW COMPARISON:  Chest x-ray dated Dec 27, 2008. FINDINGS: The heart size and mediastinal contours are within normal limits. Normal pulmonary vascularity. Patchy right infrahilar opacity. No pleural effusion or pneumothorax. No acute osseous abnormality. IMPRESSION: 1. Patchy right infrahilar opacity is favored to reflect atelectasis, less likely pneumonia. Electronically Signed   By: Titus Dubin M.D.   On: 01/24/2018 12:30     MAJOR EVENTS/TEST RESULTS:  6/24 admitted to ICU, cardiac cath  INDWELLING DEVICES:: Peripheral IVs  MICRO DATA: MRSA PCR pending    ANTIMICROBIALS:   Nil   ASSESSMENT/PLAN   1.  Acute ST elevation Inferior wall Myocardial infarction  Patient is S/P cardiac cath and found to have multi vessel dz Plan; Awaiting transfer to [facility] for CABG  2. Acute on chronic stage  1 renal impairment Plan: Hydration as per Cardiology team, hold nephrotoxic meds  3.  IDDM Plan: target glucose management  4. Hx Essential HTN Plan; hold all antihypertensive for now.  5. Hyperlipidemia Plan: start statins when ok with cardiology team  6. Hyponatremia   I have personally obtained a history, examined the patient, evaluated laboratory and independently reviewed  imaging results, formulated the assessment and plan and placed orders.  The Patient requires high complexity decision making for assessment and support, frequent evaluation and titration of therapies, application of advanced monitoring technologies and extensive interpretation of multiple databases. Critical Care Time devoted to patient care services described in this note is 35 minutes.   Overall, patient is critically ill, prognosis is guarded. Patient at high risk for cardiac arrest and death.   Cammie Sickle, M.D

## 2018-01-24 NOTE — ED Notes (Addendum)
EKG preformed at 1112, shown to MD [facility] and was told to repeat EKG. EKG repeated at 1116 and shown to MD. Pt taken back to rm 14.

## 2018-01-24 NOTE — ED Notes (Signed)
Repeat EKG performed by [facility] RN

## 2018-01-24 NOTE — ED Notes (Signed)
Pt placed on 2L O2 at this time per MD [facility]

## 2018-01-25 DIAGNOSIS — Z794 Long term (current) use of insulin: Secondary | ICD-10-CM | POA: Diagnosis not present

## 2018-01-25 DIAGNOSIS — I25119 Atherosclerotic heart disease of native coronary artery with unspecified angina pectoris: Secondary | ICD-10-CM | POA: Diagnosis not present

## 2018-01-25 DIAGNOSIS — E119 Type 2 diabetes mellitus without complications: Secondary | ICD-10-CM | POA: Diagnosis not present

## 2018-01-25 MED ORDER — ACETAMINOPHEN 325 MG PO TABS
650.00 | ORAL_TABLET | ORAL | Status: DC
Start: ? — End: 2018-01-25

## 2018-01-25 MED ORDER — LIDOCAINE HCL 1 % IJ SOLN
0.50 | INTRAMUSCULAR | Status: DC
Start: ? — End: 2018-01-25

## 2018-01-25 MED ORDER — DEXTROSE 50 % IV SOLN
12.50 | INTRAVENOUS | Status: DC
Start: ? — End: 2018-01-25

## 2018-01-25 MED ORDER — ASPIRIN EC 81 MG PO TBEC
81.00 | DELAYED_RELEASE_TABLET | ORAL | Status: DC
Start: 2018-01-26 — End: 2018-01-25

## 2018-01-25 MED ORDER — POLYETHYLENE GLYCOL 3350 17 G PO PACK
17.00 | PACK | ORAL | Status: DC
Start: 2018-01-26 — End: 2018-01-25

## 2018-01-25 MED ORDER — GENERIC EXTERNAL MEDICATION
1.00 | Status: DC
Start: ? — End: 2018-01-25

## 2018-01-25 MED ORDER — NITROGLYCERIN 0.4 MG SL SUBL
0.40 | SUBLINGUAL_TABLET | SUBLINGUAL | Status: DC
Start: ? — End: 2018-01-25

## 2018-01-25 MED ORDER — ROSUVASTATIN CALCIUM 10 MG PO TABS
5.00 | ORAL_TABLET | ORAL | Status: DC
Start: 2018-01-25 — End: 2018-01-25

## 2018-01-25 MED ORDER — PANTOPRAZOLE SODIUM 40 MG PO TBEC
40.00 | DELAYED_RELEASE_TABLET | ORAL | Status: DC
Start: 2018-01-26 — End: 2018-01-25

## 2018-01-25 MED ORDER — INSULIN LISPRO 100 UNIT/ML ~~LOC~~ SOLN
8.00 | SUBCUTANEOUS | Status: DC
Start: 2018-01-26 — End: 2018-01-25

## 2018-01-25 MED ORDER — PRAMIPEXOLE DIHYDROCHLORIDE 0.5 MG PO TABS
1.50 | ORAL_TABLET | ORAL | Status: DC
Start: 2018-01-26 — End: 2018-01-25

## 2018-01-25 MED ORDER — FLUTICASONE PROPIONATE 50 MCG/ACT NA SUSP
2.00 | NASAL | Status: DC
Start: 2018-01-25 — End: 2018-01-25

## 2018-01-25 MED ORDER — INSULIN GLARGINE 100 UNIT/ML ~~LOC~~ SOLN
20.00 | SUBCUTANEOUS | Status: DC
Start: 2018-01-25 — End: 2018-01-25

## 2018-01-25 MED ORDER — FENOFIBRATE 145 MG PO TABS
145.00 | ORAL_TABLET | ORAL | Status: DC
Start: 2018-01-26 — End: 2018-01-25

## 2018-01-25 MED ORDER — HEPARIN (PORCINE) IN NACL 100-0.45 UNIT/ML-% IJ SOLN
1450.00 | INTRAMUSCULAR | Status: DC
Start: ? — End: 2018-01-25

## 2018-01-25 MED ORDER — INSULIN LISPRO 100 UNIT/ML ~~LOC~~ SOLN
8.00 | SUBCUTANEOUS | Status: DC
Start: 2018-01-25 — End: 2018-01-25

## 2018-01-25 MED ORDER — INSULIN LISPRO 100 UNIT/ML ~~LOC~~ SOLN
0.00 | SUBCUTANEOUS | Status: DC
Start: 2018-01-25 — End: 2018-01-25

## 2018-01-25 MED ORDER — GENERIC EXTERNAL MEDICATION
6.25 | Status: DC
Start: 2018-01-25 — End: 2018-01-25

## 2018-01-25 MED ORDER — SENNOSIDES-DOCUSATE SODIUM 8.6-50 MG PO TABS
2.00 | ORAL_TABLET | ORAL | Status: DC
Start: 2018-01-25 — End: 2018-01-25

## 2018-01-26 DIAGNOSIS — I25119 Atherosclerotic heart disease of native coronary artery with unspecified angina pectoris: Secondary | ICD-10-CM | POA: Diagnosis not present

## 2018-01-26 DIAGNOSIS — E119 Type 2 diabetes mellitus without complications: Secondary | ICD-10-CM | POA: Diagnosis not present

## 2018-01-26 DIAGNOSIS — Z794 Long term (current) use of insulin: Secondary | ICD-10-CM | POA: Diagnosis not present

## 2018-01-27 DIAGNOSIS — J811 Chronic pulmonary edema: Secondary | ICD-10-CM | POA: Diagnosis not present

## 2018-01-27 DIAGNOSIS — I340 Nonrheumatic mitral (valve) insufficiency: Secondary | ICD-10-CM | POA: Diagnosis not present

## 2018-01-27 DIAGNOSIS — I213 ST elevation (STEMI) myocardial infarction of unspecified site: Secondary | ICD-10-CM | POA: Diagnosis not present

## 2018-01-27 DIAGNOSIS — I240 Acute coronary thrombosis not resulting in myocardial infarction: Secondary | ICD-10-CM | POA: Diagnosis not present

## 2018-01-27 DIAGNOSIS — I700 Atherosclerosis of aorta: Secondary | ICD-10-CM | POA: Diagnosis not present

## 2018-01-27 DIAGNOSIS — I10 Essential (primary) hypertension: Secondary | ICD-10-CM | POA: Diagnosis not present

## 2018-01-27 DIAGNOSIS — I2510 Atherosclerotic heart disease of native coronary artery without angina pectoris: Secondary | ICD-10-CM | POA: Diagnosis not present

## 2018-01-27 DIAGNOSIS — I25119 Atherosclerotic heart disease of native coronary artery with unspecified angina pectoris: Secondary | ICD-10-CM | POA: Diagnosis not present

## 2018-01-28 DIAGNOSIS — I25119 Atherosclerotic heart disease of native coronary artery with unspecified angina pectoris: Secondary | ICD-10-CM | POA: Diagnosis not present

## 2018-01-28 DIAGNOSIS — Z951 Presence of aortocoronary bypass graft: Secondary | ICD-10-CM | POA: Insufficient documentation

## 2018-01-28 DIAGNOSIS — J811 Chronic pulmonary edema: Secondary | ICD-10-CM | POA: Diagnosis not present

## 2018-01-28 DIAGNOSIS — E119 Type 2 diabetes mellitus without complications: Secondary | ICD-10-CM | POA: Diagnosis not present

## 2018-01-28 DIAGNOSIS — R918 Other nonspecific abnormal finding of lung field: Secondary | ICD-10-CM | POA: Diagnosis not present

## 2018-01-28 DIAGNOSIS — Z794 Long term (current) use of insulin: Secondary | ICD-10-CM | POA: Diagnosis not present

## 2018-01-29 DIAGNOSIS — R110 Nausea: Secondary | ICD-10-CM | POA: Diagnosis not present

## 2018-01-29 DIAGNOSIS — J9811 Atelectasis: Secondary | ICD-10-CM | POA: Diagnosis not present

## 2018-01-29 DIAGNOSIS — J811 Chronic pulmonary edema: Secondary | ICD-10-CM | POA: Diagnosis not present

## 2018-01-29 DIAGNOSIS — I25119 Atherosclerotic heart disease of native coronary artery with unspecified angina pectoris: Secondary | ICD-10-CM | POA: Diagnosis not present

## 2018-01-29 DIAGNOSIS — Z794 Long term (current) use of insulin: Secondary | ICD-10-CM | POA: Diagnosis not present

## 2018-01-29 DIAGNOSIS — K5900 Constipation, unspecified: Secondary | ICD-10-CM | POA: Diagnosis not present

## 2018-01-29 DIAGNOSIS — E119 Type 2 diabetes mellitus without complications: Secondary | ICD-10-CM | POA: Diagnosis not present

## 2018-01-30 DIAGNOSIS — J9811 Atelectasis: Secondary | ICD-10-CM | POA: Diagnosis not present

## 2018-01-30 DIAGNOSIS — J811 Chronic pulmonary edema: Secondary | ICD-10-CM | POA: Diagnosis not present

## 2018-01-30 DIAGNOSIS — Z794 Long term (current) use of insulin: Secondary | ICD-10-CM | POA: Diagnosis not present

## 2018-01-30 DIAGNOSIS — I517 Cardiomegaly: Secondary | ICD-10-CM | POA: Diagnosis not present

## 2018-01-30 DIAGNOSIS — E119 Type 2 diabetes mellitus without complications: Secondary | ICD-10-CM | POA: Diagnosis not present

## 2018-01-30 DIAGNOSIS — I25119 Atherosclerotic heart disease of native coronary artery with unspecified angina pectoris: Secondary | ICD-10-CM | POA: Diagnosis not present

## 2018-01-31 DIAGNOSIS — R739 Hyperglycemia, unspecified: Secondary | ICD-10-CM | POA: Diagnosis not present

## 2018-01-31 DIAGNOSIS — R918 Other nonspecific abnormal finding of lung field: Secondary | ICD-10-CM | POA: Diagnosis not present

## 2018-01-31 DIAGNOSIS — Z951 Presence of aortocoronary bypass graft: Secondary | ICD-10-CM | POA: Diagnosis not present

## 2018-01-31 DIAGNOSIS — Z794 Long term (current) use of insulin: Secondary | ICD-10-CM | POA: Diagnosis not present

## 2018-01-31 DIAGNOSIS — E119 Type 2 diabetes mellitus without complications: Secondary | ICD-10-CM | POA: Diagnosis not present

## 2018-01-31 DIAGNOSIS — D72829 Elevated white blood cell count, unspecified: Secondary | ICD-10-CM | POA: Insufficient documentation

## 2018-02-01 DIAGNOSIS — J90 Pleural effusion, not elsewhere classified: Secondary | ICD-10-CM | POA: Diagnosis not present

## 2018-02-01 DIAGNOSIS — E119 Type 2 diabetes mellitus without complications: Secondary | ICD-10-CM | POA: Diagnosis not present

## 2018-02-01 DIAGNOSIS — R739 Hyperglycemia, unspecified: Secondary | ICD-10-CM | POA: Diagnosis not present

## 2018-02-01 DIAGNOSIS — I4891 Unspecified atrial fibrillation: Secondary | ICD-10-CM | POA: Insufficient documentation

## 2018-02-01 DIAGNOSIS — I9789 Other postprocedural complications and disorders of the circulatory system, not elsewhere classified: Secondary | ICD-10-CM | POA: Insufficient documentation

## 2018-02-01 DIAGNOSIS — J9811 Atelectasis: Secondary | ICD-10-CM | POA: Diagnosis not present

## 2018-02-01 DIAGNOSIS — Z951 Presence of aortocoronary bypass graft: Secondary | ICD-10-CM | POA: Diagnosis not present

## 2018-02-01 DIAGNOSIS — Z794 Long term (current) use of insulin: Secondary | ICD-10-CM | POA: Diagnosis not present

## 2018-02-02 DIAGNOSIS — J90 Pleural effusion, not elsewhere classified: Secondary | ICD-10-CM | POA: Diagnosis not present

## 2018-02-02 DIAGNOSIS — Z951 Presence of aortocoronary bypass graft: Secondary | ICD-10-CM | POA: Diagnosis not present

## 2018-02-02 DIAGNOSIS — I519 Heart disease, unspecified: Secondary | ICD-10-CM | POA: Insufficient documentation

## 2018-02-02 DIAGNOSIS — J9811 Atelectasis: Secondary | ICD-10-CM | POA: Diagnosis not present

## 2018-02-03 DIAGNOSIS — Z951 Presence of aortocoronary bypass graft: Secondary | ICD-10-CM | POA: Diagnosis not present

## 2018-02-03 DIAGNOSIS — J9811 Atelectasis: Secondary | ICD-10-CM | POA: Diagnosis not present

## 2018-02-03 DIAGNOSIS — J90 Pleural effusion, not elsewhere classified: Secondary | ICD-10-CM | POA: Diagnosis not present

## 2018-02-04 DIAGNOSIS — Z951 Presence of aortocoronary bypass graft: Secondary | ICD-10-CM | POA: Diagnosis not present

## 2018-02-04 DIAGNOSIS — J90 Pleural effusion, not elsewhere classified: Secondary | ICD-10-CM | POA: Diagnosis not present

## 2018-02-04 DIAGNOSIS — J9811 Atelectasis: Secondary | ICD-10-CM | POA: Diagnosis not present

## 2018-02-04 DIAGNOSIS — E119 Type 2 diabetes mellitus without complications: Secondary | ICD-10-CM | POA: Diagnosis not present

## 2018-02-04 DIAGNOSIS — R739 Hyperglycemia, unspecified: Secondary | ICD-10-CM | POA: Diagnosis not present

## 2018-02-04 DIAGNOSIS — Z794 Long term (current) use of insulin: Secondary | ICD-10-CM | POA: Diagnosis not present

## 2018-02-08 DIAGNOSIS — I471 Supraventricular tachycardia: Secondary | ICD-10-CM | POA: Diagnosis not present

## 2018-02-08 DIAGNOSIS — Z951 Presence of aortocoronary bypass graft: Secondary | ICD-10-CM | POA: Diagnosis not present

## 2018-02-08 DIAGNOSIS — R002 Palpitations: Secondary | ICD-10-CM | POA: Diagnosis not present

## 2018-02-08 DIAGNOSIS — E119 Type 2 diabetes mellitus without complications: Secondary | ICD-10-CM | POA: Diagnosis not present

## 2018-02-22 DIAGNOSIS — Z951 Presence of aortocoronary bypass graft: Secondary | ICD-10-CM | POA: Diagnosis not present

## 2018-02-22 DIAGNOSIS — J811 Chronic pulmonary edema: Secondary | ICD-10-CM | POA: Diagnosis not present

## 2018-02-22 DIAGNOSIS — J90 Pleural effusion, not elsewhere classified: Secondary | ICD-10-CM | POA: Diagnosis not present

## 2018-02-22 DIAGNOSIS — Z9889 Other specified postprocedural states: Secondary | ICD-10-CM | POA: Diagnosis not present

## 2018-02-22 NOTE — Discharge Summary (Signed)
57 year old male with known history of hypertension, hyperlipidemia, obesity and diabetes mellitus. He started  feeling poorly on Thursday with weakness, low-grade fever, nausea and myalgia.  He felt it was some kind of a viral illness.  His symptoms continued and he developed some chest tightness but mostly shortness of breath and orthopnea on Saturday with weakness.  He had worsening dyspnea today and decided to come to the emergency room.  EKG showed small inferior Q waves with few millimeters of ST depression with reciprocal changes in the anterior leads.  His troponin was ready up to 9.  At the time of my evaluation he was chest pain-free. Stat echocardiogram was performed and personally reviewed by me which showed an EF of 35 to 40% with inferior and inferolateral wall akinesis with mild to moderate mitral regurgitation.  There was also moderate pulmonary hypertension with dilated IVC suggestive of volume overload. Urgent cardiac catheterization was performed which showed an occluded distal right coronary artery with left-to-right collaterals in addition to diffuse diabetic three-vessel coronary artery disease.  Based on this, I recommended evaluation for CABG. I discussed the case with Dr. Silvio Pate at [facility] who accepted the patient for transfer.

## 2018-02-25 DIAGNOSIS — Z794 Long term (current) use of insulin: Secondary | ICD-10-CM | POA: Diagnosis not present

## 2018-02-25 DIAGNOSIS — E1159 Type 2 diabetes mellitus with other circulatory complications: Secondary | ICD-10-CM | POA: Diagnosis not present

## 2018-03-04 DIAGNOSIS — E1159 Type 2 diabetes mellitus with other circulatory complications: Secondary | ICD-10-CM | POA: Diagnosis not present

## 2018-03-04 DIAGNOSIS — N179 Acute kidney failure, unspecified: Secondary | ICD-10-CM | POA: Diagnosis not present

## 2018-03-04 DIAGNOSIS — I10 Essential (primary) hypertension: Secondary | ICD-10-CM | POA: Diagnosis not present

## 2018-03-04 DIAGNOSIS — E119 Type 2 diabetes mellitus without complications: Secondary | ICD-10-CM | POA: Diagnosis not present

## 2018-03-07 ENCOUNTER — Encounter: Payer: Self-pay | Admitting: *Deleted

## 2018-03-07 ENCOUNTER — Encounter: Payer: BLUE CROSS/BLUE SHIELD | Attending: Cardiology | Admitting: *Deleted

## 2018-03-07 VITALS — Ht 69.2 in | Wt 205.8 lb

## 2018-03-07 DIAGNOSIS — Z9889 Other specified postprocedural states: Secondary | ICD-10-CM

## 2018-03-07 DIAGNOSIS — E785 Hyperlipidemia, unspecified: Secondary | ICD-10-CM | POA: Insufficient documentation

## 2018-03-07 DIAGNOSIS — E1122 Type 2 diabetes mellitus with diabetic chronic kidney disease: Secondary | ICD-10-CM | POA: Insufficient documentation

## 2018-03-07 DIAGNOSIS — I214 Non-ST elevation (NSTEMI) myocardial infarction: Secondary | ICD-10-CM

## 2018-03-07 DIAGNOSIS — Z951 Presence of aortocoronary bypass graft: Secondary | ICD-10-CM | POA: Diagnosis not present

## 2018-03-07 DIAGNOSIS — E119 Type 2 diabetes mellitus without complications: Secondary | ICD-10-CM | POA: Insufficient documentation

## 2018-03-07 DIAGNOSIS — I10 Essential (primary) hypertension: Secondary | ICD-10-CM | POA: Insufficient documentation

## 2018-03-07 NOTE — Patient Instructions (Signed)
Patient Instructions  Patient Details  Name: Ricardo Brown MRN: [id number] Date of Birth: [birth year] Referring Provider:  Isaias Cowman, MD  Below are your personal goals for exercise, nutrition, and risk factors. Our goal is to help you stay on track towards obtaining and maintaining these goals. We will be discussing your progress on these goals with you throughout the program.  Initial Exercise Prescription: Initial Exercise Prescription - 03/07/18 1500      Date of Initial Exercise RX and Referring Provider   Date  03/07/18    Referring Provider  Paraschos, Alexander MD      Treadmill   MPH  2.5    Grade  1    Minutes  15    METs  3.26      REL-XR   Level  3    Speed  50    Minutes  15    METs  3      T5 Nustep   Level  3    SPM  80    Minutes  15    METs  3      Prescription Details   Frequency (times per week)  2    Duration  Progress to 30 minutes of continuous aerobic without signs/symptoms of physical distress      Intensity   THRR 40-80% of Max Heartrate  118-149    Ratings of Perceived Exertion  11-13    Perceived Dyspnea  0-4      Progression   Progression  Continue to progress workloads to maintain intensity without signs/symptoms of physical distress.      Resistance Training   Training Prescription  Yes    Weight  4 lbs    Reps  10-15       Exercise Goals: Frequency: Be able to perform aerobic exercise two to three times per week in program working toward 2-5 days per week of home exercise.  Intensity: Work with a perceived exertion of 11 (fairly light) - 15 (hard) while following your exercise prescription.  We will make changes to your prescription with you as you progress through the program.   Duration: Be able to do 30 to 45 minutes of continuous aerobic exercise in addition to a 5 minute warm-up and a 5 minute cool-down routine.   Nutrition Goals: Your personal nutrition goals will be established when you do your  nutrition analysis with the dietician.  The following are general nutrition guidelines to follow: Cholesterol < 200mg /day Sodium < 1500mg /day Fiber: Men over 50 yrs - 30 grams per day  Personal Goals: Personal Goals and Risk Factors at Admission - 03/07/18 1319      Core Components/Risk Factors/Patient Goals on Admission    Weight Management  Yes;Weight Gain    Intervention  Weight Management: Develop a combined nutrition and exercise program designed to reach desired caloric intake, while maintaining appropriate intake of nutrient and fiber, sodium and fats, and appropriate energy expenditure required for the weight goal.    Admit Weight  205 lb 12.8 oz (93.4 kg)    Goal Weight: Short Term  210 lb (95.3 kg)    Goal Weight: Long Term  220 lb (99.8 kg)    Expected Outcomes  Short Term: Continue to assess and modify interventions until short term weight is achieved;Long Term: Adherence to nutrition and physical activity/exercise program aimed toward attainment of established weight goal;Weight Gain: Understanding of general recommendations for a high calorie, high protein meal plan that promotes weight  gain by distributing calorie intake throughout the day with the consumption for 4-5 meals, snacks, and/or supplements    Diabetes  Yes    Intervention  Provide education about signs/symptoms and action to take for hypo/hyperglycemia.;Provide education about proper nutrition, including hydration, and aerobic/resistive exercise prescription along with prescribed medications to achieve blood glucose in normal ranges: Fasting glucose 65-99 mg/dL    Expected Outcomes  Short Term: Participant verbalizes understanding of the signs/symptoms and immediate care of hyper/hypoglycemia, proper foot care and importance of medication, aerobic/resistive exercise and nutrition plan for blood glucose control.;Long Term: Attainment of HbA1C < 7%.    Hypertension  Yes    Intervention  Provide education on lifestyle  modifcations including regular physical activity/exercise, weight management, moderate sodium restriction and increased consumption of fresh fruit, vegetables, and low fat dairy, alcohol moderation, and smoking cessation.;Monitor prescription use compliance.    Expected Outcomes  Short Term: Continued assessment and intervention until BP is < 140/20mm HG in hypertensive participants. < 130/88mm HG in hypertensive participants with diabetes, heart failure or chronic kidney disease.;Long Term: Maintenance of blood pressure at goal levels.    Lipids  Yes    Intervention  Provide education and support for participant on nutrition & aerobic/resistive exercise along with prescribed medications to achieve LDL 70mg , HDL >40mg .    Expected Outcomes  Short Term: Participant states understanding of desired cholesterol values and is compliant with medications prescribed. Participant is following exercise prescription and nutrition guidelines.;Long Term: Cholesterol controlled with medications as prescribed, with individualized exercise RX and with personalized nutrition plan. Value goals: LDL < 70mg , HDL > 40 mg.       Tobacco Use Initial Evaluation: Social History   Tobacco Use  Smoking Status Never Smoker  Smokeless Tobacco Never Used    Exercise Goals and Review: Exercise Goals    Row Name 03/07/18 1531             Exercise Goals   Increase Physical Activity  Yes       Intervention  Provide advice, education, support and counseling about physical activity/exercise needs.;Develop an individualized exercise prescription for aerobic and resistive training based on initial evaluation findings, risk stratification, comorbidities and participant's personal goals.       Expected Outcomes  Short Term: Attend rehab on a regular basis to increase amount of physical activity.;Long Term: Add in home exercise to make exercise part of routine and to increase amount of physical activity.;Long Term: Exercising  regularly at least 3-5 days a week.       Increase Strength and Stamina  Yes       Intervention  Provide advice, education, support and counseling about physical activity/exercise needs.;Develop an individualized exercise prescription for aerobic and resistive training based on initial evaluation findings, risk stratification, comorbidities and participant's personal goals.       Expected Outcomes  Short Term: Increase workloads from initial exercise prescription for resistance, speed, and METs.;Short Term: Perform resistance training exercises routinely during rehab and add in resistance training at home;Long Term: Improve cardiorespiratory fitness, muscular endurance and strength as measured by increased METs and functional capacity (6MWT)       Able to understand and use rate of perceived exertion (RPE) scale  Yes       Intervention  Provide education and explanation on how to use RPE scale       Expected Outcomes  Short Term: Able to use RPE daily in rehab to express subjective intensity level;Long Term:  Able to  use RPE to guide intensity level when exercising independently       Able to understand and use Dyspnea scale  Yes       Intervention  Provide education and explanation on how to use Dyspnea scale       Expected Outcomes  Short Term: Able to use Dyspnea scale daily in rehab to express subjective sense of shortness of breath during exertion;Long Term: Able to use Dyspnea scale to guide intensity level when exercising independently       Knowledge and understanding of Target Heart Rate Range (THRR)  Yes       Intervention  Provide education and explanation of THRR including how the numbers were predicted and where they are located for reference       Expected Outcomes  Short Term: Able to state/look up THRR;Short Term: Able to use daily as guideline for intensity in rehab;Long Term: Able to use THRR to govern intensity when exercising independently       Able to check pulse independently  Yes        Intervention  Provide education and demonstration on how to check pulse in carotid and radial arteries.;Review the importance of being able to check your own pulse for safety during independent exercise       Expected Outcomes  Short Term: Able to explain why pulse checking is important during independent exercise;Long Term: Able to check pulse independently and accurately       Understanding of Exercise Prescription  Yes       Intervention  Provide education, explanation, and written materials on patient's individual exercise prescription       Expected Outcomes  Short Term: Able to explain program exercise prescription;Long Term: Able to explain home exercise prescription to exercise independently          Copy of goals given to participant.

## 2018-03-07 NOTE — Progress Notes (Signed)
Cardiac Individual Treatment Plan  Patient Details  Name: Ricardo Brown MRN: [id number] Date of Birth: 05-16-1961 Referring Provider:     Cardiac Rehab from 03/07/2018 in [facility] Cardiac and Pulmonary Rehab  Referring Provider  Isaias Cowman MD      Initial Encounter Date:    Cardiac Rehab from 03/07/2018 in [facility] Cardiac and Pulmonary Rehab  Date  03/07/18      Visit Diagnosis: S/P CABG x 3  NSTEMI (non-ST elevated myocardial infarction) ([place])  S/P mitral valve repair  Patient's Home Medications on Admission:  Current Outpatient Medications:  .  aspirin 81 MG chewable tablet, Chew by mouth., Disp: , Rfl:  .  Dulaglutide (TRULICITY) 9.62 XB/2.8UX SOPN, Inject into the skin., Disp: , Rfl:  .  Fenofibrate 120 MG TABS, Take by mouth., Disp: , Rfl:  .  metFORMIN (GLUCOPHAGE) 1000 MG tablet, Take by mouth., Disp: , Rfl:  .  metoprolol succinate (TOPROL-XL) 25 MG 24 hr tablet, TAKE 1 2 (ONE HALF) TABLET BY MOUTH ONCE DAILY, Disp: , Rfl: 2 .  Omega-3 Fatty Acids (FISH OIL) 875 MG CAPS, Take 1 capsule by mouth 2 (two) times daily., Disp: , Rfl:  .  pseudoephedrine (SUDAFED) 30 MG tablet, Take 30 mg by mouth every 4 (four) hours as needed for congestion., Disp: , Rfl:  .  rosuvastatin (CRESTOR) 5 MG tablet, Take by mouth., Disp: , Rfl:  .  TRESIBA FLEXTOUCH 100 UNIT/ML SOPN FlexTouch Pen, , Disp: , Rfl: 2  Past Medical History: Past Medical History:  Diagnosis Date  . Diabetes mellitus without complication ([place])    a. Dx ~ 2000.  [name] Heart palpitations    a. Pt reports prior nl echo's and stress tests. Last stress test w/in past 2 yrs - PCP.  [name] High cholesterol     Tobacco Use: Social History   Tobacco Use  Smoking Status Never Smoker  Smokeless Tobacco Never Used    Labs: Recent Review Scientist, physiological    Labs for ITP Cardiac and Pulmonary Rehab Latest Ref Rng & Units 01/24/2018   Cholestrol 0 - 200 mg/dL 163   LDLCALC 0 - 99 mg/dL 116(H)   HDL >40 mg/dL 23(L)    Trlycerides <150 mg/dL 120       Exercise Target Goals: Date: 03/07/18  Exercise Program Goal: Individual exercise prescription set using results from initial 6 min walk test and THRR while considering  patient's activity barriers and safety.   Exercise Prescription Goal: Initial exercise prescription builds to 30-45 minutes a day of aerobic activity, 2-3 days per week.  Home exercise guidelines will be given to patient during program as part of exercise prescription that the participant will acknowledge.  Activity Barriers & Risk Stratification: Activity Barriers & Cardiac Risk Stratification - 03/07/18 1318      Activity Barriers & Cardiac Risk Stratification   Activity Barriers  Joint Problems;Deconditioning;Muscular Weakness;Balance Concerns Hips hurt when bends over, occasional shoulder pain when reaching overhead    Cardiac Risk Stratification  High       6 Minute Walk: 6 Minute Walk    Row Name 03/07/18 1528         6 Minute Walk   Phase  Initial     Distance  1294 feet     Walk Time  6 minutes     # of Rest Breaks  0     MPH  2.45     METS  3.7     RPE  10  VO2 Peak  12.95     Symptoms  No     Resting HR  88 bpm     Resting BP  136/66     Resting Oxygen Saturation   99 %     Exercise Oxygen Saturation  during 6 min walk  99 %     Max Ex. HR  102 bpm     Max Ex. BP  152/74     2 Minute Post BP  122/66        Oxygen Initial Assessment:   Oxygen Re-Evaluation:   Oxygen Discharge (Final Oxygen Re-Evaluation):   Initial Exercise Prescription: Initial Exercise Prescription - 03/07/18 1500      Date of Initial Exercise RX and Referring Provider   Date  03/07/18    Referring Provider  Paraschos, Alexander MD      Treadmill   MPH  2.5    Grade  1    Minutes  15    METs  3.26      REL-XR   Level  3    Speed  50    Minutes  15    METs  3      T5 Nustep   Level  3    SPM  80    Minutes  15    METs  3      Prescription Details    Frequency (times per week)  2    Duration  Progress to 30 minutes of continuous aerobic without signs/symptoms of physical distress      Intensity   THRR 40-80% of Max Heartrate  118-149    Ratings of Perceived Exertion  11-13    Perceived Dyspnea  0-4      Progression   Progression  Continue to progress workloads to maintain intensity without signs/symptoms of physical distress.      Resistance Training   Training Prescription  Yes    Weight  4 lbs    Reps  10-15       Perform Capillary Blood Glucose checks as needed.  Exercise Prescription Changes: Exercise Prescription Changes    Row Name 03/07/18 1300             Response to Exercise   Blood Pressure (Admit)  136/66       Blood Pressure (Exercise)  152/74       Blood Pressure (Exit)  122/66       Heart Rate (Admit)  88 bpm       Heart Rate (Exercise)  101 bpm       Heart Rate (Exit)  91 bpm       Oxygen Saturation (Admit)  99 %       Oxygen Saturation (Exercise)  99 %       Rating of Perceived Exertion (Exercise)  10       Symptoms  none       Comments  walk test results          Exercise Comments:   Exercise Goals and Review: Exercise Goals    Row Name 03/07/18 1531             Exercise Goals   Increase Physical Activity  Yes       Intervention  Provide advice, education, support and counseling about physical activity/exercise needs.;Develop an individualized exercise prescription for aerobic and resistive training based on initial evaluation findings, risk stratification, comorbidities and participant's personal goals.       Expected Outcomes  Short Term: Attend rehab on a regular basis to increase amount of physical activity.;Long Term: Add in home exercise to make exercise part of routine and to increase amount of physical activity.;Long Term: Exercising regularly at least 3-5 days a week.       Increase Strength and Stamina  Yes       Intervention  Provide advice, education, support and counseling  about physical activity/exercise needs.;Develop an individualized exercise prescription for aerobic and resistive training based on initial evaluation findings, risk stratification, comorbidities and participant's personal goals.       Expected Outcomes  Short Term: Increase workloads from initial exercise prescription for resistance, speed, and METs.;Short Term: Perform resistance training exercises routinely during rehab and add in resistance training at home;Long Term: Improve cardiorespiratory fitness, muscular endurance and strength as measured by increased METs and functional capacity (6MWT)       Able to understand and use rate of perceived exertion (RPE) scale  Yes       Intervention  Provide education and explanation on how to use RPE scale       Expected Outcomes  Short Term: Able to use RPE daily in rehab to express subjective intensity level;Long Term:  Able to use RPE to guide intensity level when exercising independently       Able to understand and use Dyspnea scale  Yes       Intervention  Provide education and explanation on how to use Dyspnea scale       Expected Outcomes  Short Term: Able to use Dyspnea scale daily in rehab to express subjective sense of shortness of breath during exertion;Long Term: Able to use Dyspnea scale to guide intensity level when exercising independently       Knowledge and understanding of Target Heart Rate Range (THRR)  Yes       Intervention  Provide education and explanation of THRR including how the numbers were predicted and where they are located for reference       Expected Outcomes  Short Term: Able to state/look up THRR;Short Term: Able to use daily as guideline for intensity in rehab;Long Term: Able to use THRR to govern intensity when exercising independently       Able to check pulse independently  Yes       Intervention  Provide education and demonstration on how to check pulse in carotid and radial arteries.;Review the importance of being able  to check your own pulse for safety during independent exercise       Expected Outcomes  Short Term: Able to explain why pulse checking is important during independent exercise;Long Term: Able to check pulse independently and accurately       Understanding of Exercise Prescription  Yes       Intervention  Provide education, explanation, and written materials on patient's individual exercise prescription       Expected Outcomes  Short Term: Able to explain program exercise prescription;Long Term: Able to explain home exercise prescription to exercise independently          Exercise Goals Re-Evaluation :   Discharge Exercise Prescription (Final Exercise Prescription Changes): Exercise Prescription Changes - 03/07/18 1300      Response to Exercise   Blood Pressure (Admit)  136/66    Blood Pressure (Exercise)  152/74    Blood Pressure (Exit)  122/66    Heart Rate (Admit)  88 bpm    Heart Rate (Exercise)  101 bpm    Heart Rate (Exit)  91 bpm    Oxygen Saturation (Admit)  99 %    Oxygen Saturation (Exercise)  99 %    Rating of Perceived Exertion (Exercise)  10    Symptoms  none    Comments  walk test results       Nutrition:  Target Goals: Understanding of nutrition guidelines, daily intake of sodium <156m, cholesterol <2027m calories 30% from fat and 7% or less from saturated fats, daily to have 5 or more servings of fruits and vegetables.  Biometrics: Pre Biometrics - 03/07/18 1531      Pre Biometrics   Height  5' 9.2" (1.758 m)    Weight  205 lb 12.8 oz (93.4 kg)    Waist Circumference  41.5 inches    Hip Circumference  38 inches    Waist to Hip Ratio  1.09 %    BMI (Calculated)  30.2    Single Leg Stand  2.57 seconds        Nutrition Therapy Plan and Nutrition Goals: Nutrition Therapy & Goals - 03/07/18 1318      Intervention Plan   Intervention  Prescribe, educate and counsel regarding individualized specific dietary modifications aiming towards targeted core  components such as weight, hypertension, lipid management, diabetes, heart failure and other comorbidities.    Expected Outcomes  Short Term Goal: Understand basic principles of dietary content, such as calories, fat, sodium, cholesterol and nutrients.;Short Term Goal: A plan has been developed with personal nutrition goals set during dietitian appointment.;Long Term Goal: Adherence to prescribed nutrition plan.       Nutrition Assessments: Nutrition Assessments - 03/07/18 1319      MEDFICTS Scores   Pre Score  108       Nutrition Goals Re-Evaluation:   Nutrition Goals Discharge (Final Nutrition Goals Re-Evaluation):   Psychosocial: Target Goals: Acknowledge presence or absence of significant depression and/or stress, maximize coping skills, provide positive support system. Participant is able to verbalize types and ability to use techniques and skills needed for reducing stress and depression.   Initial Review & Psychosocial Screening: Initial Psych Review & Screening - 03/07/18 1320      Initial Review   Current issues with  None Identified      Family Dynamics   Good Support System?  Yes Spouse, daughter      Barriers   Psychosocial barriers to participate in program  The patient should benefit from training in stress management and relaxation.;There are no identifiable barriers or psychosocial needs.      Screening Interventions   Interventions  Encouraged to exercise;Provide feedback about the scores to participant;To provide support and resources with identified psychosocial needs    Expected Outcomes  Short Term goal: Utilizing psychosocial counselor, staff and physician to assist with identification of specific Stressors or current issues interfering with healing process. Setting desired goal for each stressor or current issue identified.;Long Term Goal: Stressors or current issues are controlled or eliminated.;Short Term goal: Identification and review with participant of  any Quality of Life or Depression concerns found by scoring the questionnaire.;Long Term goal: The participant improves quality of Life and PHQ9 Scores as seen by post scores and/or verbalization of changes       Quality of Life Scores:  Quality of Life - 03/07/18 1322      Quality of Life   Select  Quality of Life      Quality of Life Scores   Health/Function Pre  15.47 %    Socioeconomic Pre  24 %    Psych/Spiritual Pre  22.5 %    Family Pre  24 %    GLOBAL Pre  19.93 %      Scores of 19 and below usually indicate a poorer quality of life in these areas.  A difference of  2-3 points is a clinically meaningful difference.  A difference of 2-3 points in the total score of the Quality of Life Index has been associated with significant improvement in overall quality of life, self-image, physical symptoms, and general health in studies assessing change in quality of life.  PHQ-9: Recent Review Flowsheet Data    Depression screen [facility] 2/9 03/07/2018   Decreased Interest 0   Down, Depressed, Hopeless 0   PHQ - 2 Score 0   Altered sleeping 2   Tired, decreased energy 2   Change in appetite 2   Feeling bad or failure about yourself  0   Trouble concentrating 0   Moving slowly or fidgety/restless 0   Suicidal thoughts 0   PHQ-9 Score 6   Difficult doing work/chores Somewhat difficult     Interpretation of Total Score  Total Score Depression Severity:  1-4 = Minimal depression, 5-9 = Mild depression, 10-14 = Moderate depression, 15-19 = Moderately severe depression, 20-27 = Severe depression   Psychosocial Evaluation and Intervention:   Psychosocial Re-Evaluation:   Psychosocial Discharge (Final Psychosocial Re-Evaluation):   Vocational Rehabilitation: Provide vocational rehab assistance to qualifying candidates.   Vocational Rehab Evaluation & Intervention: Vocational Rehab - 03/07/18 1327      Initial Vocational Rehab Evaluation & Intervention   Assessment shows need  for Vocational Rehabilitation  No       Education: Education Goals: Education classes will be provided on a variety of topics geared toward better understanding of heart health and risk factor modification. Participant will state understanding/return demonstration of topics presented as noted by education test scores.  Learning Barriers/Preferences: Learning Barriers/Preferences - 03/07/18 1324      Learning Barriers/Preferences   Learning Barriers  None    Learning Preferences  None       Education Topics:  AED/CPR: - Group verbal and written instruction with the use of models to demonstrate the basic use of the AED with the basic ABC's of resuscitation.   General Nutrition Guidelines/Fats and Fiber: -Group instruction provided by verbal, written material, models and posters to present the general guidelines for heart healthy nutrition. Gives an explanation and review of dietary fats and fiber.   Controlling Sodium/Reading Food Labels: -Group verbal and written material supporting the discussion of sodium use in heart healthy nutrition. Review and explanation with models, verbal and written materials for utilization of the food label.   Exercise Physiology & General Exercise Guidelines: - Group verbal and written instruction with models to review the exercise physiology of the cardiovascular system and associated critical values. Provides general exercise guidelines with specific guidelines to those with heart or lung disease.    Aerobic Exercise & Resistance Training: - Gives group verbal and written instruction on the various components of exercise. Focuses on aerobic and resistive training programs and the benefits of this training and how to safely progress through these programs..   Flexibility, Balance, Mind/Body Relaxation: Provides group verbal/written instruction on the benefits of flexibility and balance training, including mind/body exercise modes such as yoga,  pilates and tai chi.  Demonstration and skill practice provided.   Stress and Anxiety: - Provides group verbal and written instruction about the health risks  of elevated stress and causes of high stress.  Discuss the correlation between heart/lung disease and anxiety and treatment options. Review healthy ways to manage with stress and anxiety.   Depression: - Provides group verbal and written instruction on the correlation between heart/lung disease and depressed mood, treatment options, and the stigmas associated with seeking treatment.   Anatomy & Physiology of the Heart: - Group verbal and written instruction and models provide basic cardiac anatomy and physiology, with the coronary electrical and arterial systems. Review of Valvular disease and Heart Failure   Cardiac Procedures: - Group verbal and written instruction to review commonly prescribed medications for heart disease. Reviews the medication, class of the drug, and side effects. Includes the steps to properly store meds and maintain the prescription regimen. (beta blockers and nitrates)   Cardiac Medications I: - Group verbal and written instruction to review commonly prescribed medications for heart disease. Reviews the medication, class of the drug, and side effects. Includes the steps to properly store meds and maintain the prescription regimen.   Cardiac Medications II: -Group verbal and written instruction to review commonly prescribed medications for heart disease. Reviews the medication, class of the drug, and side effects. (all other drug classes)    Go Sex-Intimacy & Heart Disease, Get SMART - Goal Setting: - Group verbal and written instruction through game format to discuss heart disease and the return to sexual intimacy. Provides group verbal and written material to discuss and apply goal setting through the application of the S.M.A.R.T. Method.   Other Matters of the Heart: - Provides group verbal, written  materials and models to describe Stable Angina and Peripheral Artery. Includes description of the disease process and treatment options available to the cardiac patient.   Exercise & Equipment Safety: - Individual verbal instruction and demonstration of equipment use and safety with use of the equipment.   Infection Prevention: - Provides verbal and written material to individual with discussion of infection control including proper hand washing and proper equipment cleaning during exercise session.   Falls Prevention: - Provides verbal and written material to individual with discussion of falls prevention and safety.   Diabetes: - Individual verbal and written instruction to review signs/symptoms of diabetes, desired ranges of glucose level fasting, after meals and with exercise. Acknowledge that pre and post exercise glucose checks will be done for 3 sessions at entry of program.   Know Your Numbers and Risk Factors: -Group verbal and written instruction about important numbers in your health.  Discussion of what are risk factors and how they play a role in the disease process.  Review of Cholesterol, Blood Pressure, Diabetes, and BMI and the role they play in your overall health.   Sleep Hygiene: -Provides group verbal and written instruction about how sleep can affect your health.  Define sleep hygiene, discuss sleep cycles and impact of sleep habits. Review good sleep hygiene tips.    Other: -Provides group and verbal instruction on various topics (see comments)   Knowledge Questionnaire Score: Knowledge Questionnaire Score - 03/07/18 1325      Knowledge Questionnaire Score   Pre Score  21/26 Reviewed corret responses with Billy today. He verbalized understanding of the responses. Areas missed Angina, PAD ,Exercise and Nutrition       Core Components/Risk Factors/Patient Goals at Admission: Personal Goals and Risk Factors at Admission - 03/07/18 1319      Core  Components/Risk Factors/Patient Goals on Admission    Weight Management  Yes;Weight Gain  Intervention  Weight Management: Develop a combined nutrition and exercise program designed to reach desired caloric intake, while maintaining appropriate intake of nutrient and fiber, sodium and fats, and appropriate energy expenditure required for the weight goal.    Admit Weight  205 lb 12.8 oz (93.4 kg)    Goal Weight: Short Term  210 lb (95.3 kg)    Goal Weight: Long Term  220 lb (99.8 kg)    Expected Outcomes  Short Term: Continue to assess and modify interventions until short term weight is achieved;Long Term: Adherence to nutrition and physical activity/exercise program aimed toward attainment of established weight goal;Weight Gain: Understanding of general recommendations for a high calorie, high protein meal plan that promotes weight gain by distributing calorie intake throughout the day with the consumption for 4-5 meals, snacks, and/or supplements    Diabetes  Yes    Intervention  Provide education about signs/symptoms and action to take for hypo/hyperglycemia.;Provide education about proper nutrition, including hydration, and aerobic/resistive exercise prescription along with prescribed medications to achieve blood glucose in normal ranges: Fasting glucose 65-99 mg/dL    Expected Outcomes  Short Term: Participant verbalizes understanding of the signs/symptoms and immediate care of hyper/hypoglycemia, proper foot care and importance of medication, aerobic/resistive exercise and nutrition plan for blood glucose control.;Long Term: Attainment of HbA1C < 7%.    Hypertension  Yes    Intervention  Provide education on lifestyle modifcations including regular physical activity/exercise, weight management, moderate sodium restriction and increased consumption of fresh fruit, vegetables, and low fat dairy, alcohol moderation, and smoking cessation.;Monitor prescription use compliance.    Expected Outcomes   Short Term: Continued assessment and intervention until BP is < 140/72m HG in hypertensive participants. < 130/820mHG in hypertensive participants with diabetes, heart failure or chronic kidney disease.;Long Term: Maintenance of blood pressure at goal levels.    Lipids  Yes    Intervention  Provide education and support for participant on nutrition & aerobic/resistive exercise along with prescribed medications to achieve LDL <7034mHDL >16m33m  Expected Outcomes  Short Term: Participant states understanding of desired cholesterol values and is compliant with medications prescribed. Participant is following exercise prescription and nutrition guidelines.;Long Term: Cholesterol controlled with medications as prescribed, with individualized exercise RX and with personalized nutrition plan. Value goals: LDL < 70mg32mL > 40 mg.       Core Components/Risk Factors/Patient Goals Review:    Core Components/Risk Factors/Patient Goals at Discharge (Final Review):    ITP Comments:   Comments: Initial ITP

## 2018-03-08 ENCOUNTER — Encounter: Payer: BLUE CROSS/BLUE SHIELD | Admitting: *Deleted

## 2018-03-08 DIAGNOSIS — Z951 Presence of aortocoronary bypass graft: Secondary | ICD-10-CM | POA: Diagnosis not present

## 2018-03-08 DIAGNOSIS — I214 Non-ST elevation (NSTEMI) myocardial infarction: Secondary | ICD-10-CM

## 2018-03-08 DIAGNOSIS — Z9889 Other specified postprocedural states: Secondary | ICD-10-CM

## 2018-03-08 LAB — GLUCOSE, CAPILLARY
Glucose-Capillary: 179 mg/dL — ABNORMAL HIGH (ref 70–99)
Glucose-Capillary: 136 mg/dL — ABNORMAL HIGH (ref 70–99)

## 2018-03-08 NOTE — Progress Notes (Signed)
Daily Session Note  Patient Details  Name: Ricardo Brown MRN: [id number] Date of Birth: 09/14/1960 Referring Provider:     Cardiac Rehab from 03/07/2018 in [facility]  Referring Provider  Isaias Cowman MD      Encounter Date: 03/08/2018  Check In: Session Check In - 03/08/18 0842      Check-In   Supervising physician immediately available to respond to emergencies  See telemetry face sheet for immediately available ER MD    Location  [facility]-Cardiac & Pulmonary Rehab    Staff Present  Joellyn Rued, BS, PEC;Susanne Bice, RN, BSN, CCRP;Kerith Sherley [place], MA, RCEP, CCRP, Exercise Physiologist;Amanda Sommer, BA, ACSM CEP, Exercise Physiologist    Medication changes reported      No    Fall or balance concerns reported     No    Tobacco Cessation  No Change    Warm-up and Cool-down  Performed on first and last piece of equipment    Resistance Training Performed  Yes    VAD Patient?  No    PAD/SET Patient?  No      Pain Assessment   Currently in Pain?  No/denies        Exercise Prescription Changes - 03/07/18 1300      Response to Exercise   Blood Pressure (Admit)  136/66    Blood Pressure (Exercise)  152/74    Blood Pressure (Exit)  122/66    Heart Rate (Admit)  88 bpm    Heart Rate (Exercise)  101 bpm    Heart Rate (Exit)  91 bpm    Oxygen Saturation (Admit)  99 %    Oxygen Saturation (Exercise)  99 %    Rating of Perceived Exertion (Exercise)  10    Symptoms  none    Comments  walk test results       Social History   Tobacco Use  Smoking Status Never Smoker  Smokeless Tobacco Never Used    Goals Met:  Exercise tolerated well No report of cardiac concerns or symptoms Strength training completed today  Goals Unmet:  Not Applicable  Comments: First full day of exercise!  Patient was oriented to gym and equipment including functions, settings, policies, and procedures.  Patient's individual exercise prescription and treatment  plan were reviewed.  All starting workloads were established based on the results of the 6 minute walk test done at initial orientation visit.  The plan for exercise progression was also introduced and progression will be customized based on patient's performance and goals.    Dr. Emily Filbert is Medical Director for [place] and [name].

## 2018-03-10 ENCOUNTER — Encounter: Payer: BLUE CROSS/BLUE SHIELD | Admitting: *Deleted

## 2018-03-10 DIAGNOSIS — I214 Non-ST elevation (NSTEMI) myocardial infarction: Secondary | ICD-10-CM

## 2018-03-10 DIAGNOSIS — Z951 Presence of aortocoronary bypass graft: Secondary | ICD-10-CM | POA: Diagnosis not present

## 2018-03-10 LAB — GLUCOSE, CAPILLARY
Glucose-Capillary: 140 mg/dL — ABNORMAL HIGH (ref 70–99)
Glucose-Capillary: 126 mg/dL — ABNORMAL HIGH (ref 70–99)

## 2018-03-10 NOTE — Progress Notes (Signed)
Daily Session Note  Patient Details  Name: Ricardo Brown MRN: [id number] Date of Birth: 06/29/1961 Referring Provider:     Cardiac Rehab from 03/07/2018 in [facility] Cardiac and Pul[facility] Rehab  Referring Provider  Isaias Cowman MD      Encounter Date: 03/10/2018  Check In: Session Check In - 03/10/18 0849      Check-In   Supervising physician immediately available to respond to emergencies  See telemetry face sheet for immediately available ER MD    Location  [facility]-Cardiac & Pulmonary Rehab    Staff Present  Renita Papa, RN BSN;Eva Vallee Luan [place], MA, RCEP, CCRP, Exercise Physiologist;Amanda Oletta Darter, BA, ACSM CEP, Exercise Physiologist;Mandi Ballard, BS, PEC    Medication changes reported      No    Fall or balance concerns reported     No    Warm-up and Cool-down  Performed on first and last piece of equipment    Resistance Training Performed  Yes    VAD Patient?  No    PAD/SET Patient?  No      Pain Assessment   Currently in Pain?  No/denies          Social History   Tobacco Use  Smoking Status Never Smoker  Smokeless Tobacco Never Used    Goals Met:  Independence with exercise equipment Exercise tolerated well No report of cardiac concerns or symptoms Strength training completed today  Goals Unmet:  Not Applicable  Comments: Pt able to follow exercise prescription today without complaint.  Will continue to monitor for progression.    Dr. Emily Filbert is Medical Director for [facility] and [name].

## 2018-03-11 DIAGNOSIS — R002 Palpitations: Secondary | ICD-10-CM | POA: Diagnosis not present

## 2018-03-11 DIAGNOSIS — E119 Type 2 diabetes mellitus without complications: Secondary | ICD-10-CM | POA: Diagnosis not present

## 2018-03-11 DIAGNOSIS — I471 Supraventricular tachycardia: Secondary | ICD-10-CM | POA: Diagnosis not present

## 2018-03-11 DIAGNOSIS — I10 Essential (primary) hypertension: Secondary | ICD-10-CM | POA: Diagnosis not present

## 2018-03-14 ENCOUNTER — Encounter: Payer: Self-pay | Admitting: Podiatry

## 2018-03-14 ENCOUNTER — Ambulatory Visit (INDEPENDENT_AMBULATORY_CARE_PROVIDER_SITE_OTHER): Payer: BLUE CROSS/BLUE SHIELD | Admitting: Podiatry

## 2018-03-14 VITALS — BP 115/76 | HR 94

## 2018-03-14 DIAGNOSIS — E1142 Type 2 diabetes mellitus with diabetic polyneuropathy: Secondary | ICD-10-CM

## 2018-03-14 DIAGNOSIS — L84 Corns and callosities: Secondary | ICD-10-CM | POA: Diagnosis not present

## 2018-03-14 DIAGNOSIS — M79675 Pain in left toe(s): Secondary | ICD-10-CM | POA: Diagnosis not present

## 2018-03-14 DIAGNOSIS — M79674 Pain in right toe(s): Secondary | ICD-10-CM

## 2018-03-14 DIAGNOSIS — B351 Tinea unguium: Secondary | ICD-10-CM | POA: Diagnosis not present

## 2018-03-14 NOTE — Progress Notes (Signed)
This patient presents to the office for an evaluation of his diabetic feet. Patient states that he is been diabetic for over 20 years.  He states he has a history of diabetic ulcers on his right foot, which were  treated successfully at the wound care center in [place].  He presents the office today stating there is no evidence of any diabetic ulcers on the bottom of his right foot.  He also has thick disfigured discolored nails  with subungual debris on all digits.   He presents the office today for treatment of his nails and evaluation of his diabetic feet.    General Appearance  Alert, conversant and in no acute stress.  Vascular  Dorsalis pedis and posterior tibial  pulses are palpable  bilaterally.  Capillary return is within normal limits  bilaterally. Temperature is within normal limits  bilaterally.  Neurologic  Absent LOPS right foot and diminished LOPS left foot.. Muscle power within normal limits bilaterally.  Nails Thick disfigured discolored nails with subungual debris  from hallux to fifth toes bilaterally. No evidence of bacterial infection or drainage bilaterally.  Orthopedic  No limitations of motion of motion feet .  No crepitus or effusions noted.  No bony pathology or digital deformities noted.  HAV  B/L.  Skin  normotropic skin with no porokeratosis noted bilaterally.  No signs of infections or ulcers noted.  Preulcerous callus sub 2,5 right foot and heel right foot.  No signs of redness or infection noted.  Diabetic neuropathy  Onychomycosis  Initial exam.  Debridement of nails 10.  After evaluating his feet, which revealed LOPS  Disease,  I recommended he return to the office for an evaluation with Liliane Channel  for diabetic insoles and or diabetic shoes.  RTC 4 months for continued foot treatment.  Gardiner Barefoot DPM

## 2018-03-15 ENCOUNTER — Encounter: Payer: BLUE CROSS/BLUE SHIELD | Admitting: *Deleted

## 2018-03-15 DIAGNOSIS — Z9889 Other specified postprocedural states: Secondary | ICD-10-CM

## 2018-03-15 DIAGNOSIS — I214 Non-ST elevation (NSTEMI) myocardial infarction: Secondary | ICD-10-CM

## 2018-03-15 DIAGNOSIS — Z951 Presence of aortocoronary bypass graft: Secondary | ICD-10-CM | POA: Diagnosis not present

## 2018-03-15 LAB — GLUCOSE, CAPILLARY
Glucose-Capillary: 185 mg/dL — ABNORMAL HIGH (ref 70–99)
Glucose-Capillary: 136 mg/dL — ABNORMAL HIGH (ref 70–99)

## 2018-03-15 NOTE — Progress Notes (Signed)
Daily Session Note  Patient Details  Name: Ricardo Brown MRN: [id number] Date of Birth: 03-27-1961 Referring Provider:     Cardiac Rehab from 03/07/2018 in [facility] Cardiac and Pulmonary Rehab  Referring Provider  Ricardo Cowman MD      Encounter Date: 03/15/2018  Check In: Session Check In - 03/15/18 0804      Check-In   Supervising physician immediately available to respond to emergencies  See telemetry face sheet for immediately available ER MD    Location  [facility]-Cardiac & Pulmonary Rehab    Staff Present  Ricardo Brown, BS, PEC;Ricardo Bice, RN, BSN, CCRP;Ricardo [place], MA, RCEP, CCRP, Exercise Physiologist;Ricardo Brown, BA, ACSM CEP, Exercise Physiologist    Medication changes reported      No    Fall or balance concerns reported     No    Tobacco Cessation  No Change    Warm-up and Cool-down  Performed on first and last piece of equipment    Resistance Training Performed  Yes    VAD Patient?  No    PAD/SET Patient?  No      Pain Assessment   Currently in Pain?  No/denies    Multiple Pain Sites  No          Social History   Tobacco Use  Smoking Status Never Smoker  Smokeless Tobacco Never Used    Goals Met:  Independence with exercise equipment Exercise tolerated well No report of cardiac concerns or symptoms Strength training completed today  Goals Unmet:  Not Applicable  Comments: Pt able to follow exercise prescription today without complaint.  Will continue to monitor for progression.    Dr. Emily Filbert is Medical Director for [name] and [name].

## 2018-03-16 ENCOUNTER — Encounter: Payer: Self-pay | Admitting: *Deleted

## 2018-03-16 DIAGNOSIS — Z951 Presence of aortocoronary bypass graft: Secondary | ICD-10-CM

## 2018-03-16 DIAGNOSIS — I214 Non-ST elevation (NSTEMI) myocardial infarction: Secondary | ICD-10-CM

## 2018-03-16 DIAGNOSIS — Z9889 Other specified postprocedural states: Secondary | ICD-10-CM

## 2018-03-16 NOTE — Progress Notes (Signed)
Cardiac Individual Treatment Plan  Patient Details  Name: Ricardo Brown MRN: [id number] Date of Birth: [birth year] Referring Provider:     Cardiac Rehab from 03/07/2018 in [facility] Cardiac and Pulmonary Rehab  Referring Provider  Isaias Cowman MD      Initial Encounter Date:    Cardiac Rehab from 03/07/2018 in [facility] Cardiac and Pulmonary Rehab  Date  03/07/18      Visit Diagnosis: NSTEMI (non-ST elevated myocardial infarction) ([place])  S/P CABG x 3  S/P mitral valve repair  Patient's Home Medications on Admission:  Current Outpatient Medications:  .  aspirin 81 MG chewable tablet, Chew by mouth., Disp: , Rfl:  .  Dulaglutide (TRULICITY) 9.38 HW/2.9HB SOPN, Inject into the skin., Disp: , Rfl:  .  Fenofibrate 120 MG TABS, Take by mouth., Disp: , Rfl:  .  metFORMIN (GLUCOPHAGE) 1000 MG tablet, Take by mouth., Disp: , Rfl:  .  metoprolol succinate (TOPROL-XL) 25 MG 24 hr tablet, TAKE 1 2 (ONE HALF) TABLET BY MOUTH ONCE DAILY, Disp: , Rfl: 2 .  Omega-3 Fatty Acids (FISH OIL) 875 MG CAPS, Take 1 capsule by mouth 2 (two) times daily., Disp: , Rfl:  .  pseudoephedrine (SUDAFED) 30 MG tablet, Take 30 mg by mouth every 4 (four) hours as needed for congestion., Disp: , Rfl:  .  rosuvastatin (CRESTOR) 5 MG tablet, Take by mouth., Disp: , Rfl:  .  TRESIBA FLEXTOUCH 100 UNIT/ML SOPN FlexTouch Pen, , Disp: , Rfl: 2  Past Medical History: Past Medical History:  Diagnosis Date  . Diabetes mellitus without complication ([place])    a. Dx ~ 2000.  [name] Heart palpitations    a. Pt reports prior nl echo's and stress tests. Last stress test w/in past 2 yrs - PCP.  [name] High cholesterol     Tobacco Use: Social History   Tobacco Use  Smoking Status Never Smoker  Smokeless Tobacco Never Used    Labs: Recent Review Scientist, physiological    Labs for ITP Cardiac and Pulmonary Rehab Latest Ref Rng & Units 01/24/2018   Cholestrol 0 - 200 mg/dL 163   LDLCALC 0 - 99 mg/dL 116(H)   HDL >40 mg/dL 23(L)    Trlycerides <150 mg/dL 120       Exercise Target Goals: Exercise Program Goal: Individual exercise prescription set using results from initial 6 min walk test and THRR while considering  patient's activity barriers and safety.   Exercise Prescription Goal: Initial exercise prescription builds to 30-45 minutes a day of aerobic activity, 2-3 days per week.  Home exercise guidelines will be given to patient during program as part of exercise prescription that the participant will acknowledge.  Activity Barriers & Risk Stratification: Activity Barriers & Cardiac Risk Stratification - 03/07/18 1318      Activity Barriers & Cardiac Risk Stratification   Activity Barriers  Joint Problems;Deconditioning;Muscular Weakness;Balance Concerns   Hips hurt when bends over, occasional shoulder pain when reaching overhead   Cardiac Risk Stratification  High       6 Minute Walk: 6 Minute Walk    Row Name 03/07/18 1528         6 Minute Walk   Phase  Initial     Distance  1294 feet     Walk Time  6 minutes     # of Rest Breaks  0     MPH  2.45     METS  3.7     RPE  10  VO2 Peak  12.95     Symptoms  No     Resting HR  88 bpm     Resting BP  136/66     Resting Oxygen Saturation   99 %     Exercise Oxygen Saturation  during 6 min walk  99 %     Max Ex. HR  102 bpm     Max Ex. BP  152/74     2 Minute Post BP  122/66        Oxygen Initial Assessment:   Oxygen Re-Evaluation:   Oxygen Discharge (Final Oxygen Re-Evaluation):   Initial Exercise Prescription: Initial Exercise Prescription - 03/07/18 1500      Date of Initial Exercise RX and Referring Provider   Date  03/07/18    Referring Provider  Paraschos, Alexander MD      Treadmill   MPH  2.5    Grade  1    Minutes  15    METs  3.26      REL-XR   Level  3    Speed  50    Minutes  15    METs  3      T5 Nustep   Level  3    SPM  80    Minutes  15    METs  3      Prescription Details   Frequency (times  per week)  2    Duration  Progress to 30 minutes of continuous aerobic without signs/symptoms of physical distress      Intensity   THRR 40-80% of Max Heartrate  118-149    Ratings of Perceived Exertion  11-13    Perceived Dyspnea  0-4      Progression   Progression  Continue to progress workloads to maintain intensity without signs/symptoms of physical distress.      Resistance Training   Training Prescription  Yes    Weight  4 lbs    Reps  10-15       Perform Capillary Blood Glucose checks as needed.  Exercise Prescription Changes: Exercise Prescription Changes    Row Name 03/07/18 1300 03/08/18 1500           Response to Exercise   Blood Pressure (Admit)  136/66  134/72      Blood Pressure (Exercise)  152/74  148/70      Blood Pressure (Exit)  122/66  122/64      Heart Rate (Admit)  88 bpm  95 bpm      Heart Rate (Exercise)  101 bpm  115 bpm      Heart Rate (Exit)  91 bpm  98 bpm      Oxygen Saturation (Admit)  99 %  -      Oxygen Saturation (Exercise)  99 %  -      Rating of Perceived Exertion (Exercise)  10  14      Symptoms  none  SOB      Comments  walk test results  first full day of exercise      Duration  -  Continue with 30 min of aerobic exercise without signs/symptoms of physical distress.      Intensity  -  THRR unchanged        Progression   Progression  -  Continue to progress workloads to maintain intensity without signs/symptoms of physical distress.      Average METs  -  2.83  Resistance Training   Training Prescription  -  Yes      Weight  -  4 lbs      Reps  -  10-15        Interval Training   Interval Training  -  No        Treadmill   MPH  -  2.5      Grade  -  1      Minutes  -  15      METs  -  3.26        T5 Nustep   Level  -  3      Minutes  -  15      METs  -  2.4         Exercise Comments: Exercise Comments    Row Name 03/08/18 [phone]           Exercise Comments  First full day of exercise!  Patient was  oriented to gym and equipment including functions, settings, policies, and procedures.  Patient's individual exercise prescription and treatment plan were reviewed.  All starting workloads were established based on the results of the 6 minute walk test done at initial orientation visit.  The plan for exercise progression was also introduced and progression will be customized based on patient's performance and goals.          Exercise Goals and Review: Exercise Goals    Row Name 03/07/18 1531             Exercise Goals   Increase Physical Activity  Yes       Intervention  Provide advice, education, support and counseling about physical activity/exercise needs.;Develop an individualized exercise prescription for aerobic and resistive training based on initial evaluation findings, risk stratification, comorbidities and participant's personal goals.       Expected Outcomes  Short Term: Attend rehab on a regular basis to increase amount of physical activity.;Long Term: Add in home exercise to make exercise part of routine and to increase amount of physical activity.;Long Term: Exercising regularly at least 3-5 days a week.       Increase Strength and Stamina  Yes       Intervention  Provide advice, education, support and counseling about physical activity/exercise needs.;Develop an individualized exercise prescription for aerobic and resistive training based on initial evaluation findings, risk stratification, comorbidities and participant's personal goals.       Expected Outcomes  Short Term: Increase workloads from initial exercise prescription for resistance, speed, and METs.;Short Term: Perform resistance training exercises routinely during rehab and add in resistance training at home;Long Term: Improve cardiorespiratory fitness, muscular endurance and strength as measured by increased METs and functional capacity (6MWT)       Able to understand and use rate of perceived exertion (RPE) scale  Yes        Intervention  Provide education and explanation on how to use RPE scale       Expected Outcomes  Short Term: Able to use RPE daily in rehab to express subjective intensity level;Long Term:  Able to use RPE to guide intensity level when exercising independently       Able to understand and use Dyspnea scale  Yes       Intervention  Provide education and explanation on how to use Dyspnea scale       Expected Outcomes  Short Term: Able to use Dyspnea scale daily in rehab to express subjective sense of  shortness of breath during exertion;Long Term: Able to use Dyspnea scale to guide intensity level when exercising independently       Knowledge and understanding of Target Heart Rate Range (THRR)  Yes       Intervention  Provide education and explanation of THRR including how the numbers were predicted and where they are located for reference       Expected Outcomes  Short Term: Able to state/look up THRR;Short Term: Able to use daily as guideline for intensity in rehab;Long Term: Able to use THRR to govern intensity when exercising independently       Able to check pulse independently  Yes       Intervention  Provide education and demonstration on how to check pulse in carotid and radial arteries.;Review the importance of being able to check your own pulse for safety during independent exercise       Expected Outcomes  Short Term: Able to explain why pulse checking is important during independent exercise;Long Term: Able to check pulse independently and accurately       Understanding of Exercise Prescription  Yes       Intervention  Provide education, explanation, and written materials on patient's individual exercise prescription       Expected Outcomes  Short Term: Able to explain program exercise prescription;Long Term: Able to explain home exercise prescription to exercise independently          Exercise Goals Re-Evaluation : Exercise Goals Re-Evaluation    [place] Name 03/08/18 0931              Exercise Goal Re-Evaluation   Exercise Goals Review  Increase Physical Activity;Able to understand and use rate of perceived exertion (RPE) scale;Knowledge and understanding of Target Heart Rate Range (THRR);Understanding of Exercise Prescription;Increase Strength and Stamina;Able to understand and use Dyspnea scale       Comments  Reviewed RPE scale, THR and program prescription with pt today.  Pt voiced understanding and was given a copy of goals to take home.        Expected Outcomes  Short: Use RPE daily to regulate intensity. Long: Follow program prescription in THR.          Discharge Exercise Prescription (Final Exercise Prescription Changes): Exercise Prescription Changes - 03/08/18 1500      Response to Exercise   Blood Pressure (Admit)  134/72    Blood Pressure (Exercise)  148/70    Blood Pressure (Exit)  122/64    Heart Rate (Admit)  95 bpm    Heart Rate (Exercise)  115 bpm    Heart Rate (Exit)  98 bpm    Rating of Perceived Exertion (Exercise)  14    Symptoms  SOB    Comments  first full day of exercise    Duration  Continue with 30 min of aerobic exercise without signs/symptoms of physical distress.    Intensity  THRR unchanged      Progression   Progression  Continue to progress workloads to maintain intensity without signs/symptoms of physical distress.    Average METs  2.83      Resistance Training   Training Prescription  Yes    Weight  4 lbs    Reps  10-15      Interval Training   Interval Training  No      Treadmill   MPH  2.5    Grade  1    Minutes  15    METs  3.26  T5 Nustep   Level  3    Minutes  15    METs  2.4       Nutrition:  Target Goals: Understanding of nutrition guidelines, daily intake of sodium <1573m, cholesterol <2059m calories 30% from fat and 7% or less from saturated fats, daily to have 5 or more servings of fruits and vegetables.  Biometrics: Pre Biometrics - 03/07/18 1531      Pre Biometrics   Height  5' 9.2"  (1.758 m)    Weight  205 lb 12.8 oz (93.4 kg)    Waist Circumference  41.5 inches    Hip Circumference  38 inches    Waist to Hip Ratio  1.09 %    BMI (Calculated)  30.2    Single Leg Stand  2.57 seconds        Nutrition Therapy Plan and Nutrition Goals: Nutrition Therapy & Goals - 03/15/18 1139      Nutrition Therapy   Diet  DM    Protein (specify units)  12-13oz    Fiber  35 grams    Whole Grain Foods  3 servings   chooses whole grains semi-regularly   Saturated Fats  16 max. grams    Fruits and Vegetables  5 servings/day   8 ideal   Sodium  1500 grams      Personal Nutrition Goals   Nutrition Goal  When you return to work, try to keep the current early breakfast and mid-morning snack schedule that you've implemented, rather than waiting to eat and skipping a snack    Personal Goal #2  Start to become more aware of the sodium content of foods by reading nutrition facts labels more regularly, and choose lower sodium varieties IE less than 15010mer serving    Personal Goal #3  Continue to practice portion control when it comes to starch-heavy foods. Using the ice cream scoop for reference is a great strategy, good job!    Comments  He has started to monitor his sodium intake since hospitalization. Does not drink sugar sweetened beverages and gets a variety of fiber and protein sources throughout the day. He has recently changed his morning eating schedule which is better suited for DM management      Intervention Plan   Intervention  Prescribe, educate and counsel regarding individualized specific dietary modifications aiming towards targeted core components such as weight, hypertension, lipid management, diabetes, heart failure and other comorbidities.;Nutrition handout(s) given to patient.   Low sodium nutrition therapy, Salt food swaps handouts   Expected Outcomes  Short Term Goal: Understand basic principles of dietary content, such as calories, fat, sodium, cholesterol and  nutrients.;Short Term Goal: A plan has been developed with personal nutrition goals set during dietitian appointment.;Long Term Goal: Adherence to prescribed nutrition plan.       Nutrition Assessments: Nutrition Assessments - 03/07/18 1319      MEDFICTS Scores   Pre Score  108       Nutrition Goals Re-Evaluation: Nutrition Goals Re-Evaluation    Row Name 03/15/18 1203             Goals   Nutrition Goal  When you return to work, try to keep the current early breakfast and mid-morning snack schedule that you've implemented, rather than waiting to eat and skipping a snack       Comment  He used to wait to eat breakfast until mid morning, but now he has been eating breakfast earlier and implementing a mid-morning  snack, which he hopes to continue when he returns to work       Expected Outcome  He will eat breakfast and a mid-morning snack on his work break regularly to improve BG control         Personal Goal #2 Re-Evaluation   Personal Goal #2  Start to become more aware of the sodium content of foods by reading nutrition facts labels more regularly, and choose lower sodium varieties IE less than 159m per serving         Personal Goal #3 Re-Evaluation   Personal Goal #3  Continue to practice portion control when it comes to starch-heavy foods. Using the ice cream scoop for reference is a great strategy, good job!          Nutrition Goals Discharge (Final Nutrition Goals Re-Evaluation): Nutrition Goals Re-Evaluation - 03/15/18 1203      Goals   Nutrition Goal  When you return to work, try to keep the current early breakfast and mid-morning snack schedule that you've implemented, rather than waiting to eat and skipping a snack    Comment  He used to wait to eat breakfast until mid morning, but now he has been eating breakfast earlier and implementing a mid-morning snack, which he hopes to continue when he returns to work    Expected Outcome  He will eat breakfast and a mid-morning  snack on his work break regularly to improve BG control      Personal Goal #2 Re-Evaluation   Personal Goal #2  Start to become more aware of the sodium content of foods by reading nutrition facts labels more regularly, and choose lower sodium varieties IE less than 1554mper serving      Personal Goal #3 Re-Evaluation   Personal Goal #3  Continue to practice portion control when it comes to starch-heavy foods. Using the ice cream scoop for reference is a great strategy, good job!       Psychosocial: Target Goals: Acknowledge presence or absence of significant depression and/or stress, maximize coping skills, provide positive support system. Participant is able to verbalize types and ability to use techniques and skills needed for reducing stress and depression.   Initial Review & Psychosocial Screening: Initial Psych Review & Screening - 03/07/18 1320      Initial Review   Current issues with  None Identified      Family Dynamics   Good Support System?  Yes   Spouse, daughter     Barriers   Psychosocial barriers to participate in program  The patient should benefit from training in stress management and relaxation.;There are no identifiable barriers or psychosocial needs.      Screening Interventions   Interventions  Encouraged to exercise;Provide feedback about the scores to participant;To provide support and resources with identified psychosocial needs    Expected Outcomes  Short Term goal: Utilizing psychosocial counselor, staff and physician to assist with identification of specific Stressors or current issues interfering with healing process. Setting desired goal for each stressor or current issue identified.;Long Term Goal: Stressors or current issues are controlled or eliminated.;Short Term goal: Identification and review with participant of any Quality of Life or Depression concerns found by scoring the questionnaire.;Long Term goal: The participant improves quality of Life and  PHQ9 Scores as seen by post scores and/or verbalization of changes       Quality of Life Scores:  Quality of Life - 03/07/18 1322      Quality of Life  Select  Quality of Life      Quality of Life Scores   Health/Function Pre  15.47 %    Socioeconomic Pre  24 %    Psych/Spiritual Pre  22.5 %    Family Pre  24 %    GLOBAL Pre  19.93 %      Scores of 19 and below usually indicate a poorer quality of life in these areas.  A difference of  2-3 points is a clinically meaningful difference.  A difference of 2-3 points in the total score of the Quality of Life Index has been associated with significant improvement in overall quality of life, self-image, physical symptoms, and general health in studies assessing change in quality of life.  PHQ-9: Recent Review Flowsheet Data    Depression screen [facility] 2/9 03/07/2018   Decreased Interest 0   Down, Depressed, Hopeless 0   PHQ - 2 Score 0   Altered sleeping 2   Tired, decreased energy 2   Change in appetite 2   Feeling bad or failure about yourself  0   Trouble concentrating 0   Moving slowly or fidgety/restless 0   Suicidal thoughts 0   PHQ-9 Score 6   Difficult doing work/chores Somewhat difficult     Interpretation of Total Score  Total Score Depression Severity:  1-4 = Minimal depression, 5-9 = Mild depression, 10-14 = Moderate depression, 15-19 = Moderately severe depression, 20-27 = Severe depression   Psychosocial Evaluation and Intervention:   Psychosocial Re-Evaluation:   Psychosocial Discharge (Final Psychosocial Re-Evaluation):   Vocational Rehabilitation: Provide vocational rehab assistance to qualifying candidates.   Vocational Rehab Evaluation & Intervention: Vocational Rehab - 03/07/18 1327      Initial Vocational Rehab Evaluation & Intervention   Assessment shows need for Vocational Rehabilitation  No       Education: Education Goals: Education classes will be provided on a variety of topics geared  toward better understanding of heart health and risk factor modification. Participant will state understanding/return demonstration of topics presented as noted by education test scores.  Learning Barriers/Preferences: Learning Barriers/Preferences - 03/07/18 1324      Learning Barriers/Preferences   Learning Barriers  None    Learning Preferences  None       Education Topics:  AED/CPR: - Group verbal and written instruction with the use of models to demonstrate the basic use of the AED with the basic ABC's of resuscitation.   General Nutrition Guidelines/Fats and Fiber: -Group instruction provided by verbal, written material, models and posters to present the general guidelines for heart healthy nutrition. Gives an explanation and review of dietary fats and fiber.   Controlling Sodium/Reading Food Labels: -Group verbal and written material supporting the discussion of sodium use in heart healthy nutrition. Review and explanation with models, verbal and written materials for utilization of the food label.   Exercise Physiology & General Exercise Guidelines: - Group verbal and written instruction with models to review the exercise physiology of the cardiovascular system and associated critical values. Provides general exercise guidelines with specific guidelines to those with heart or lung disease.    Aerobic Exercise & Resistance Training: - Gives group verbal and written instruction on the various components of exercise. Focuses on aerobic and resistive training programs and the benefits of this training and how to safely progress through these programs..   Cardiac Rehab from 03/15/2018 in [facility] Cardiac and Pulmonary Rehab  Date  03/10/18  Educator  AS  Instruction Review Code  1- Verbalizes Understanding      Flexibility, Balance, Mind/Body Relaxation: Provides group verbal/written instruction on the benefits of flexibility and balance training, including mind/body exercise  modes such as yoga, pilates and tai chi.  Demonstration and skill practice provided.   Cardiac Rehab from 03/15/2018 in [facility] Cardiac and Pulmonary Rehab  Date  03/15/18  Educator  AS  Instruction Review Code  1- Verbalizes Understanding      Stress and Anxiety: - Provides group verbal and written instruction about the health risks of elevated stress and causes of high stress.  Discuss the correlation between heart/lung disease and anxiety and treatment options. Review healthy ways to manage with stress and anxiety.   Depression: - Provides group verbal and written instruction on the correlation between heart/lung disease and depressed mood, treatment options, and the stigmas associated with seeking treatment.   Cardiac Rehab from 03/15/2018 in [facility] Cardiac and Pulmonary Rehab  Date  03/08/18  Educator  [facility]  Instruction Review Code  1- Verbalizes Understanding      Anatomy & Physiology of the Heart: - Group verbal and written instruction and models provide basic cardiac anatomy and physiology, with the coronary electrical and arterial systems. Review of Valvular disease and Heart Failure   Cardiac Procedures: - Group verbal and written instruction to review commonly prescribed medications for heart disease. Reviews the medication, class of the drug, and side effects. Includes the steps to properly store meds and maintain the prescription regimen. (beta blockers and nitrates)   Cardiac Medications I: - Group verbal and written instruction to review commonly prescribed medications for heart disease. Reviews the medication, class of the drug, and side effects. Includes the steps to properly store meds and maintain the prescription regimen.   Cardiac Medications II: -Group verbal and written instruction to review commonly prescribed medications for heart disease. Reviews the medication, class of the drug, and side effects. (all other drug classes)    Go Sex-Intimacy & Heart Disease, Get  SMART - Goal Setting: - Group verbal and written instruction through game format to discuss heart disease and the return to sexual intimacy. Provides group verbal and written material to discuss and apply goal setting through the application of the S.M.A.R.T. Method.   Other Matters of the Heart: - Provides group verbal, written materials and models to describe Stable Angina and Peripheral Artery. Includes description of the disease process and treatment options available to the cardiac patient.   Exercise & Equipment Safety: - Individual verbal instruction and demonstration of equipment use and safety with use of the equipment.   Cardiac Rehab from 03/15/2018 in [facility] Cardiac and Pulmonary Rehab  Date  03/07/18  Educator  Sb  Instruction Review Code  1- Verbalizes Understanding      Infection Prevention: - Provides verbal and written material to individual with discussion of infection control including proper hand washing and proper equipment cleaning during exercise session.   Cardiac Rehab from 03/15/2018 in [facility] Cardiac and Pulmonary Rehab  Date  03/07/18  Educator  SB  Instruction Review Code  1- Verbalizes Understanding      Falls Prevention: - Provides verbal and written material to individual with discussion of falls prevention and safety.   Cardiac Rehab from 03/15/2018 in [facility] Cardiac and Pulmonary Rehab  Date  03/07/18  Educator  SB  Instruction Review Code  1- Verbalizes Understanding      Diabetes: - Individual verbal and written instruction to review signs/symptoms of diabetes, desired ranges of glucose level fasting,  after meals and with exercise. Acknowledge that pre and post exercise glucose checks will be done for 3 sessions at entry of program.   Know Your Numbers and Risk Factors: -Group verbal and written instruction about important numbers in your health.  Discussion of what are risk factors and how they play a role in the disease process.  Review of  Cholesterol, Blood Pressure, Diabetes, and BMI and the role they play in your overall health.   Sleep Hygiene: -Provides group verbal and written instruction about how sleep can affect your health.  Define sleep hygiene, discuss sleep cycles and impact of sleep habits. Review good sleep hygiene tips.    Other: -Provides group and verbal instruction on various topics (see comments)   Knowledge Questionnaire Score: Knowledge Questionnaire Score - 03/07/18 1325      Knowledge Questionnaire Score   Pre Score  21/26   Reviewed corret responses with Billy today. He verbalized understanding of the responses. Areas missed Angina, PAD ,Exercise and Nutrition      Core Components/Risk Factors/Patient Goals at Admission: Personal Goals and Risk Factors at Admission - 03/07/18 1319      Core Components/Risk Factors/Patient Goals on Admission    Weight Management  Yes;Weight Gain    Intervention  Weight Management: Develop a combined nutrition and exercise program designed to reach desired caloric intake, while maintaining appropriate intake of nutrient and fiber, sodium and fats, and appropriate energy expenditure required for the weight goal.    Admit Weight  205 lb 12.8 oz (93.4 kg)    Goal Weight: Short Term  210 lb (95.3 kg)    Goal Weight: Long Term  220 lb (99.8 kg)    Expected Outcomes  Short Term: Continue to assess and modify interventions until short term weight is achieved;Long Term: Adherence to nutrition and physical activity/exercise program aimed toward attainment of established weight goal;Weight Gain: Understanding of general recommendations for a high calorie, high protein meal plan that promotes weight gain by distributing calorie intake throughout the day with the consumption for 4-5 meals, snacks, and/or supplements    Diabetes  Yes    Intervention  Provide education about signs/symptoms and action to take for hypo/hyperglycemia.;Provide education about proper nutrition,  including hydration, and aerobic/resistive exercise prescription along with prescribed medications to achieve blood glucose in normal ranges: Fasting glucose 65-99 mg/dL    Expected Outcomes  Short Term: Participant verbalizes understanding of the signs/symptoms and immediate care of hyper/hypoglycemia, proper foot care and importance of medication, aerobic/resistive exercise and nutrition plan for blood glucose control.;Long Term: Attainment of HbA1C < 7%.    Hypertension  Yes    Intervention  Provide education on lifestyle modifcations including regular physical activity/exercise, weight management, moderate sodium restriction and increased consumption of fresh fruit, vegetables, and low fat dairy, alcohol moderation, and smoking cessation.;Monitor prescription use compliance.    Expected Outcomes  Short Term: Continued assessment and intervention until BP is < 140/72m HG in hypertensive participants. < 130/837mHG in hypertensive participants with diabetes, heart failure or chronic kidney disease.;Long Term: Maintenance of blood pressure at goal levels.    Lipids  Yes    Intervention  Provide education and support for participant on nutrition & aerobic/resistive exercise along with prescribed medications to achieve LDL <7045mHDL >65m39m  Expected Outcomes  Short Term: Participant states understanding of desired cholesterol values and is compliant with medications prescribed. Participant is following exercise prescription and nutrition guidelines.;Long Term: Cholesterol controlled with medications as prescribed, with  individualized exercise RX and with personalized nutrition plan. Value goals: LDL < 63m, HDL > 40 mg.       Core Components/Risk Factors/Patient Goals Review:    Core Components/Risk Factors/Patient Goals at Discharge (Final Review):    ITP Comments: ITP Comments    Row Name 03/16/18 0812           ITP Comments  30 day review completed. ITP sent to Dr. BRamonita Lab covering  for Dr. MEmily Filbert Medical Director of Cardiac Rehab. Continue with ITP unless changes are made by physician.  New to program          Comments: 30 day review

## 2018-03-17 ENCOUNTER — Encounter: Payer: BLUE CROSS/BLUE SHIELD | Admitting: *Deleted

## 2018-03-17 DIAGNOSIS — Z951 Presence of aortocoronary bypass graft: Secondary | ICD-10-CM

## 2018-03-17 DIAGNOSIS — I214 Non-ST elevation (NSTEMI) myocardial infarction: Secondary | ICD-10-CM

## 2018-03-17 DIAGNOSIS — Z9889 Other specified postprocedural states: Secondary | ICD-10-CM

## 2018-03-17 NOTE — Progress Notes (Signed)
Daily Session Note  Patient Details  Name: Ricardo Brown MRN: [id number] Date of Birth: [birth year] Referring Provider:     Cardiac Rehab from 03/07/2018 in [facility] Cardiac and Pulmonary Rehab  Referring Provider  Isaias Cowman MD      Encounter Date: 03/17/2018  Check In: Session Check In - 03/17/18 0808      Check-In   Supervising physician immediately available to respond to emergencies  See telemetry face sheet for immediately available ER MD    Location  [facility]-Cardiac & Pulmonary Rehab    Staff Present  Joellyn Rued, BS, PEC;Carroll Enterkin, RN, BSN;Jessica [place], MA, RCEP, CCRP, Exercise Physiologist;Amanda Oletta Darter, BA, ACSM CEP, Exercise Physiologist    Medication changes reported      No    Fall or balance concerns reported     No    Warm-up and Cool-down  Performed on first and last piece of equipment    Resistance Training Performed  Yes    VAD Patient?  No    PAD/SET Patient?  No      Pain Assessment   Currently in Pain?  No/denies          Social History   Tobacco Use  Smoking Status Never Smoker  Smokeless Tobacco Never Used    Goals Met:  Independence with exercise equipment Exercise tolerated well No report of cardiac concerns or symptoms Strength training completed today  Goals Unmet:  Not Applicable  Comments: Pt able to follow exercise prescription today without complaint.  Will continue to monitor for progression. Reviewed home exercise with pt today.  Pt plans to walk at home and dumbbell exercises for exercise.  Reviewed THR, pulse, RPE, sign and symptoms, NTG use, and when to call 911 or MD.  Also discussed weather considerations and indoor options.  Pt voiced understanding.     Dr. Emily Filbert is Medical Director for [place] and [name].

## 2018-03-22 ENCOUNTER — Encounter: Payer: BLUE CROSS/BLUE SHIELD | Admitting: *Deleted

## 2018-03-22 DIAGNOSIS — Z951 Presence of aortocoronary bypass graft: Secondary | ICD-10-CM | POA: Diagnosis not present

## 2018-03-22 DIAGNOSIS — I471 Supraventricular tachycardia, unspecified: Secondary | ICD-10-CM | POA: Insufficient documentation

## 2018-03-22 DIAGNOSIS — I214 Non-ST elevation (NSTEMI) myocardial infarction: Secondary | ICD-10-CM

## 2018-03-22 DIAGNOSIS — E119 Type 2 diabetes mellitus without complications: Secondary | ICD-10-CM | POA: Diagnosis not present

## 2018-03-22 DIAGNOSIS — E1159 Type 2 diabetes mellitus with other circulatory complications: Secondary | ICD-10-CM | POA: Diagnosis not present

## 2018-03-22 DIAGNOSIS — Z9889 Other specified postprocedural states: Secondary | ICD-10-CM

## 2018-03-22 DIAGNOSIS — I10 Essential (primary) hypertension: Secondary | ICD-10-CM | POA: Diagnosis not present

## 2018-03-22 NOTE — Progress Notes (Signed)
Daily Session Note  Patient Details  Name: Ricardo Brown MRN: [id number] Date of Birth: April 06, 1961 Referring Provider:     Cardiac Rehab from 03/07/2018 in [facility] Cardiac and Pulmonary Rehab  Referring Provider  Isaias Cowman MD      Encounter Date: 03/22/2018  Check In:      Social History   Tobacco Use  Smoking Status Never Smoker  Smokeless Tobacco Never Used    Goals Met:  Independence with exercise equipment Exercise tolerated well No report of cardiac concerns or symptoms Strength training completed today  Goals Unmet:  Not Applicable  Comments: Pt able to follow exercise prescription today without complaint.  Will continue to monitor for progression.  During education, Abe People noted that he was having palpitations again like he did over the weekend.  We hooked him back up on the monitor and he was in SVT.  He had called cardiology yesterday with no return call.  RN called office today during education. Abe People was sent up to his cardiologist after class.    Dr. Emily Filbert is Medical Director for [place] and [name].

## 2018-03-23 ENCOUNTER — Ambulatory Visit (INDEPENDENT_AMBULATORY_CARE_PROVIDER_SITE_OTHER): Payer: BLUE CROSS/BLUE SHIELD | Admitting: Orthotics

## 2018-03-23 DIAGNOSIS — E1142 Type 2 diabetes mellitus with diabetic polyneuropathy: Secondary | ICD-10-CM

## 2018-03-23 DIAGNOSIS — L84 Corns and callosities: Secondary | ICD-10-CM | POA: Diagnosis not present

## 2018-03-23 NOTE — Progress Notes (Signed)
Patient came into today to be cast for [employer]. Upon recommendation of Dr. Prudence Davidson Patient presents with hx of diabetic foot ulcer Goals are offloading 2 R Plan vendor [facility]

## 2018-03-24 DIAGNOSIS — H3582 Retinal ischemia: Secondary | ICD-10-CM | POA: Diagnosis not present

## 2018-03-24 DIAGNOSIS — I214 Non-ST elevation (NSTEMI) myocardial infarction: Secondary | ICD-10-CM

## 2018-03-24 DIAGNOSIS — Z9889 Other specified postprocedural states: Secondary | ICD-10-CM

## 2018-03-24 DIAGNOSIS — E113513 Type 2 diabetes mellitus with proliferative diabetic retinopathy with macular edema, bilateral: Secondary | ICD-10-CM | POA: Diagnosis not present

## 2018-03-24 DIAGNOSIS — H35372 Puckering of macula, left eye: Secondary | ICD-10-CM | POA: Diagnosis not present

## 2018-03-24 DIAGNOSIS — Z951 Presence of aortocoronary bypass graft: Secondary | ICD-10-CM | POA: Diagnosis not present

## 2018-03-24 NOTE — Progress Notes (Signed)
Daily Session Note  Patient Details  Name: Ricardo Brown MRN: [id number] Date of Birth: 03/29/1961 Referring Provider:     Cardiac Rehab from 03/07/2018 in [facility] Cardiac and Pulmonary Rehab  Referring Provider  Isaias Cowman MD      Encounter Date: 03/24/2018  Check In:      Social History   Tobacco Use  Smoking Status Never Smoker  Smokeless Tobacco Never Used    Goals Met:  Independence with exercise equipment Exercise tolerated well No report of cardiac concerns or symptoms Strength training completed today  Goals Unmet:  Not Applicable  Comments: Pt able to follow exercise prescription today without complaint.  Will continue to monitor for progression.    Dr. Emily Filbert is Medical Director for [place] and [name].

## 2018-03-29 ENCOUNTER — Encounter: Payer: BLUE CROSS/BLUE SHIELD | Admitting: *Deleted

## 2018-03-29 DIAGNOSIS — Z9889 Other specified postprocedural states: Secondary | ICD-10-CM

## 2018-03-29 DIAGNOSIS — Z951 Presence of aortocoronary bypass graft: Secondary | ICD-10-CM

## 2018-03-29 DIAGNOSIS — I214 Non-ST elevation (NSTEMI) myocardial infarction: Secondary | ICD-10-CM

## 2018-03-29 NOTE — Progress Notes (Signed)
Daily Session Note  Patient Details  Name: Ricardo Brown MRN: [id number] Date of Birth: 1960/11/02 Referring Provider:     Cardiac Rehab from 03/07/2018 in [facility] Cardiac and Pulmonary Rehab  Referring Provider  Isaias Cowman MD      Encounter Date: 03/29/2018  Check In: Session Check In - 03/29/18 0867      Check-In   Supervising physician immediately available to respond to emergencies  See telemetry face sheet for immediately available ER MD    Location  [facility]-Cardiac & Pulmonary Rehab    Staff Present  Joellyn Rued, BS, PEC;Susanne Bice, RN, BSN, CCRP;Jessica [place], MA, RCEP, CCRP, Exercise Physiologist;Denis Koppel Sommer, BA, ACSM CEP, Exercise Physiologist    Medication changes reported      No    Fall or balance concerns reported     No    Tobacco Cessation  No Change    Warm-up and Cool-down  Performed on first and last piece of equipment    Resistance Training Performed  Yes    VAD Patient?  No    PAD/SET Patient?  No      Pain Assessment   Currently in Pain?  No/denies    Multiple Pain Sites  No          Social History   Tobacco Use  Smoking Status Never Smoker  Smokeless Tobacco Never Used    Goals Met:  Independence with exercise equipment Personal goals reviewed No report of cardiac concerns or symptoms Strength training completed today  Goals Unmet:  Not Applicable  Comments: Pt able to follow exercise prescription today without complaint.  Will continue to monitor for progression.    Dr. Emily Filbert is Medical Director for [place] and [name].

## 2018-03-30 DIAGNOSIS — E875 Hyperkalemia: Secondary | ICD-10-CM | POA: Diagnosis not present

## 2018-03-30 DIAGNOSIS — Z794 Long term (current) use of insulin: Secondary | ICD-10-CM | POA: Diagnosis not present

## 2018-03-30 DIAGNOSIS — E1159 Type 2 diabetes mellitus with other circulatory complications: Secondary | ICD-10-CM | POA: Diagnosis not present

## 2018-03-31 ENCOUNTER — Encounter: Payer: BLUE CROSS/BLUE SHIELD | Admitting: *Deleted

## 2018-03-31 DIAGNOSIS — Z951 Presence of aortocoronary bypass graft: Secondary | ICD-10-CM

## 2018-03-31 DIAGNOSIS — I214 Non-ST elevation (NSTEMI) myocardial infarction: Secondary | ICD-10-CM

## 2018-03-31 DIAGNOSIS — Z9889 Other specified postprocedural states: Secondary | ICD-10-CM

## 2018-03-31 NOTE — Progress Notes (Signed)
Daily Session Note  Patient Details  Name: Ricardo Brown MRN: [id number] Date of Birth: [birth year] Referring Provider:     Cardiac Rehab from 03/07/2018 in [facility] Cardiac and Pulmonary Rehab  Referring Provider  Isaias Cowman MD      Encounter Date: 03/31/2018  Check In: Session Check In - 03/31/18 0802      Check-In   Supervising physician immediately available to respond to emergencies  See telemetry face sheet for immediately available ER MD    Location  [facility]-Cardiac & Pulmonary Rehab    Staff Present  Joellyn Rued, BS, PEC;Carroll Enterkin, RN, BSN;Jessica [place], MA, RCEP, CCRP, Exercise Physiologist;Leahanna Buser Sommer, BA, ACSM CEP, Exercise Physiologist    Medication changes reported      No    Fall or balance concerns reported     No    Tobacco Cessation  No Change    Warm-up and Cool-down  Performed on first and last piece of equipment    Resistance Training Performed  Yes    VAD Patient?  No    PAD/SET Patient?  No      Pain Assessment   Currently in Pain?  No/denies    Multiple Pain Sites  No          Social History   Tobacco Use  Smoking Status Never Smoker  Smokeless Tobacco Never Used    Goals Met:  Independence with exercise equipment Exercise tolerated well No report of cardiac concerns or symptoms Strength training completed today  Goals Unmet:  Not Applicable  Comments: Pt able to follow exercise prescription today without complaint.  Will continue to monitor for progression.    Dr. Emily Filbert is Medical Director for [place] and [name].

## 2018-04-06 ENCOUNTER — Encounter: Payer: BLUE CROSS/BLUE SHIELD | Attending: Cardiology

## 2018-04-06 DIAGNOSIS — Z9889 Other specified postprocedural states: Secondary | ICD-10-CM

## 2018-04-06 DIAGNOSIS — I214 Non-ST elevation (NSTEMI) myocardial infarction: Secondary | ICD-10-CM

## 2018-04-06 DIAGNOSIS — Z951 Presence of aortocoronary bypass graft: Secondary | ICD-10-CM | POA: Insufficient documentation

## 2018-04-06 NOTE — Progress Notes (Signed)
Ricardo Brown was not able to exercise today due to SVT. HR sustained 150s. He took his metoprolol tartrate 50 mg as prescribed. Patient stayed on monitor until HR stable (80s), took over an hour after taking medication. No complaints of chest discomfort. He reported this happened recently and he saw Dr. Saralyn Pilar where he increased his daily metoprolol succinate from 25 mg to 50 mg. (See note from 8/20 office visit).

## 2018-04-07 ENCOUNTER — Telehealth: Payer: Self-pay | Admitting: *Deleted

## 2018-04-07 NOTE — Telephone Encounter (Signed)
Ricardo Brown called to inform staff that he is sick with a virus and will not be coming to class today. He also was able to get in with his PCP this Friday regarding his SVT that he experienced in class yesterday.

## 2018-04-08 DIAGNOSIS — E1165 Type 2 diabetes mellitus with hyperglycemia: Secondary | ICD-10-CM | POA: Diagnosis not present

## 2018-04-08 DIAGNOSIS — Z951 Presence of aortocoronary bypass graft: Secondary | ICD-10-CM | POA: Diagnosis not present

## 2018-04-08 DIAGNOSIS — R002 Palpitations: Secondary | ICD-10-CM | POA: Diagnosis not present

## 2018-04-08 DIAGNOSIS — E119 Type 2 diabetes mellitus without complications: Secondary | ICD-10-CM | POA: Diagnosis not present

## 2018-04-11 DIAGNOSIS — I214 Non-ST elevation (NSTEMI) myocardial infarction: Secondary | ICD-10-CM

## 2018-04-11 DIAGNOSIS — Z951 Presence of aortocoronary bypass graft: Secondary | ICD-10-CM

## 2018-04-11 DIAGNOSIS — Z9889 Other specified postprocedural states: Secondary | ICD-10-CM

## 2018-04-11 NOTE — Progress Notes (Signed)
Daily Session Note  Patient Details  Name: DEBBIE BELLUCCI MRN: [id number] Date of Birth: 1961-07-25 Referring Provider:     Cardiac Rehab from 03/07/2018 in [facility] Cardiac and Pulmonary Rehab  Referring Provider  Isaias Cowman MD      Encounter Date: 04/11/2018  Check In: Session Check In - 04/11/18 1727      Check-In   Supervising physician immediately available to respond to emergencies  See telemetry face sheet for immediately available ER MD    Location  [facility]-Cardiac & Pulmonary Rehab    Staff Present  Nada Maclachlan, BA, ACSM CEP, Exercise Physiologist;Carroll Enterkin, RN, Moises Blood, BS, ACSM CEP, Exercise Physiologist    Medication changes reported      No    Fall or balance concerns reported     No    Warm-up and Cool-down  Performed on first and last piece of equipment    Resistance Training Performed  Yes    VAD Patient?  No    PAD/SET Patient?  No      Pain Assessment   Currently in Pain?  No/denies    Multiple Pain Sites  No          Social History   Tobacco Use  Smoking Status Never Smoker  Smokeless Tobacco Never Used    Goals Met:  Independence with exercise equipment Exercise tolerated well No report of cardiac concerns or symptoms Strength training completed today  Goals Unmet:  Not Applicable  Comments: Pt able to follow exercise prescription today without complaint.  Will continue to monitor for progression.    Dr. Emily Filbert is Medical Director for [place] and [name].

## 2018-04-12 DIAGNOSIS — R2681 Unsteadiness on feet: Secondary | ICD-10-CM | POA: Diagnosis not present

## 2018-04-12 DIAGNOSIS — E1165 Type 2 diabetes mellitus with hyperglycemia: Secondary | ICD-10-CM | POA: Diagnosis not present

## 2018-04-12 DIAGNOSIS — E785 Hyperlipidemia, unspecified: Secondary | ICD-10-CM | POA: Diagnosis not present

## 2018-04-12 DIAGNOSIS — I10 Essential (primary) hypertension: Secondary | ICD-10-CM | POA: Diagnosis not present

## 2018-04-13 ENCOUNTER — Encounter: Payer: Self-pay | Admitting: *Deleted

## 2018-04-13 DIAGNOSIS — Z9889 Other specified postprocedural states: Secondary | ICD-10-CM

## 2018-04-13 DIAGNOSIS — Z951 Presence of aortocoronary bypass graft: Secondary | ICD-10-CM

## 2018-04-13 DIAGNOSIS — I214 Non-ST elevation (NSTEMI) myocardial infarction: Secondary | ICD-10-CM

## 2018-04-13 NOTE — Progress Notes (Signed)
Cardiac Individual Treatment Plan  Patient Details  Name: Ricardo Brown MRN: [id number] Date of Birth: [birth year] Referring Provider:     Cardiac Rehab from 03/07/2018 in [facility] Cardiac and Pulmonary Rehab  Referring Provider  Ricardo Cowman MD      Initial Encounter Date:    Cardiac Rehab from 03/07/2018 in [facility] Cardiac and Pulmonary Rehab  Date  03/07/18      Visit Diagnosis: NSTEMI (non-ST elevated myocardial infarction) ([place])  S/P CABG x 3  S/P mitral valve repair  Patient's Home Medications on Admission:  Current Outpatient Medications:  .  aspirin 81 MG chewable tablet, Chew by mouth., Disp: , Rfl:  .  Fenofibrate 120 MG TABS, Take by mouth., Disp: , Rfl:  .  metFORMIN (GLUCOPHAGE) 1000 MG tablet, Take by mouth., Disp: , Rfl:  .  metoprolol succinate (TOPROL-XL) 25 MG 24 hr tablet, TAKE 1 2 (ONE HALF) TABLET BY MOUTH ONCE DAILY, Disp: , Rfl: 2 .  Omega-3 Fatty Acids (FISH OIL) 875 MG CAPS, Take 1 capsule by mouth 2 (two) times daily., Disp: , Rfl:  .  pseudoephedrine (SUDAFED) 30 MG tablet, Take 30 mg by mouth every 4 (four) hours as needed for congestion., Disp: , Rfl:  .  rosuvastatin (CRESTOR) 5 MG tablet, Take by mouth., Disp: , Rfl:  .  TRESIBA FLEXTOUCH 100 UNIT/ML SOPN FlexTouch Pen, , Disp: , Rfl: 2  Past Medical History: Past Medical History:  Diagnosis Date  . Diabetes mellitus without complication ([place])    a. Dx ~ 2000.  [name] Heart palpitations    a. Pt reports prior nl echo's and stress tests. Last stress test w/in past 2 yrs - PCP.  [name] High cholesterol     Tobacco Use: Social History   Tobacco Use  Smoking Status Never Smoker  Smokeless Tobacco Never Used    Labs: Recent Review Scientist, physiological    Labs for ITP Cardiac and Pulmonary Rehab Latest Ref Rng & Units 01/24/2018   Cholestrol 0 - 200 mg/dL 163   LDLCALC 0 - 99 mg/dL 116(H)   HDL >40 mg/dL 23(L)   Trlycerides <150 mg/dL 120       Exercise Target Goals: Exercise Program  Goal: Individual exercise prescription set using results from initial 6 min walk test and THRR while considering  patient's activity barriers and safety.   Exercise Prescription Goal: Initial exercise prescription builds to 30-45 minutes a day of aerobic activity, 2-3 days per week.  Home exercise guidelines will be given to patient during program as part of exercise prescription that the participant will acknowledge.  Activity Barriers & Risk Stratification: Activity Barriers & Cardiac Risk Stratification - 03/07/18 1318      Activity Barriers & Cardiac Risk Stratification   Activity Barriers  Joint Problems;Deconditioning;Muscular Weakness;Balance Concerns   Hips hurt when bends over, occasional shoulder pain when reaching overhead   Cardiac Risk Stratification  High       6 Minute Walk: 6 Minute Walk    Row Name 03/07/18 1528         6 Minute Walk   Phase  Initial     Distance  1294 feet     Walk Time  6 minutes     # of Rest Breaks  0     MPH  2.45     METS  3.7     RPE  10     VO2 Peak  12.95     Symptoms  No  Resting HR  88 bpm     Resting BP  136/66     Resting Oxygen Saturation   99 %     Exercise Oxygen Saturation  during 6 min walk  99 %     Max Ex. HR  102 bpm     Max Ex. BP  152/74     2 Minute Post BP  122/66        Oxygen Initial Assessment:   Oxygen Re-Evaluation:   Oxygen Discharge (Final Oxygen Re-Evaluation):   Initial Exercise Prescription: Initial Exercise Prescription - 03/07/18 1500      Date of Initial Exercise RX and Referring Provider   Date  03/07/18    Referring Provider  Ricardo Brown, Alexander MD      Treadmill   MPH  2.5    Grade  1    Minutes  15    METs  3.26      REL-XR   Level  3    Speed  50    Minutes  15    METs  3      T5 Nustep   Level  3    SPM  80    Minutes  15    METs  3      Prescription Details   Frequency (times per week)  2    Duration  Progress to 30 minutes of continuous aerobic without  signs/symptoms of physical distress      Intensity   THRR 40-80% of Max Heartrate  118-149    Ratings of Perceived Exertion  11-13    Perceived Dyspnea  0-4      Progression   Progression  Continue to progress workloads to maintain intensity without signs/symptoms of physical distress.      Resistance Training   Training Prescription  Yes    Weight  4 lbs    Reps  10-15       Perform Capillary Blood Glucose checks as needed.  Exercise Prescription Changes: Exercise Prescription Changes    Row Name 03/07/18 1300 03/08/18 1500 03/17/18 0800 03/22/18 1500 04/07/18 0900     Response to Exercise   Blood Pressure (Admit)  136/66  134/72  -  120/72  132/76   Blood Pressure (Exercise)  152/74  148/70  -  156/70  132/74   Blood Pressure (Exit)  122/66  122/64  -  124/72  118/68   Heart Rate (Admit)  88 bpm  95 bpm  -  91 bpm  88 bpm   Heart Rate (Exercise)  101 bpm  115 bpm  -  111 bpm  108 bpm   Heart Rate (Exit)  91 bpm  98 bpm  -  96 bpm  98 bpm   Oxygen Saturation (Admit)  99 %  -  -  -  -   Oxygen Saturation (Exercise)  99 %  -  -  -  -   Rating of Perceived Exertion (Exercise)  10  14  -  13  13   Symptoms  none  SOB  -  none  none   Comments  walk test results  first full day of exercise  -  -  -   Duration  -  Continue with 30 min of aerobic exercise without signs/symptoms of physical distress.  -  Continue with 30 min of aerobic exercise without signs/symptoms of physical distress.  Continue with 45 min of aerobic exercise without signs/symptoms of physical distress.  Intensity  -  THRR unchanged  -  THRR unchanged  THRR unchanged     Progression   Progression  -  Continue to progress workloads to maintain intensity without signs/symptoms of physical distress.  -  Continue to progress workloads to maintain intensity without signs/symptoms of physical distress.  Continue to progress workloads to maintain intensity without signs/symptoms of physical distress.   Average METs  -   2.83  -  2.94  3.5     Resistance Training   Training Prescription  -  Yes  -  Yes  Yes   Weight  -  4 lbs  -  5 lbs  5 lb   Reps  -  10-15  -  10-15  10-15     Interval Training   Interval Training  -  No  -  No  No     Treadmill   MPH  -  2.5  -  3  3   Grade  -  1  -  1  1.5   Minutes  -  15  -  15  15   METs  -  3.26  -  3.71  3.92     REL-XR   Level  -  -  -  5  5   Minutes  -  -  -  15  15   METs  -  -  -  2.4  3.8     T5 Nustep   Level  -  3  -  3  -   Minutes  -  15  -  15  -   METs  -  2.4  -  2.7  -     Home Exercise Plan   Plans to continue exercise at  -  -  Home (comment) walks outside or at the mall, dumbbell exercises   Home (comment) walks outside or at the mall, dumbbell exercises   Home (comment) walks outside or at the mall, dumbbell exercises    Frequency  -  -  Add 3 additional days to program exercise sessions.  Add 3 additional days to program exercise sessions.  Add 3 additional days to program exercise sessions.   Initial Home Exercises Provided  -  -  03/17/18  03/17/18  03/17/18      Exercise Comments: Exercise Comments    Row Name 03/08/18 [phone]           Exercise Comments  First full day of exercise!  Patient was oriented to gym and equipment including functions, settings, policies, and procedures.  Patient's individual exercise prescription and treatment plan were reviewed.  All starting workloads were established based on the results of the 6 minute walk test done at initial orientation visit.  The plan for exercise progression was also introduced and progression will be customized based on patient's performance and goals.          Exercise Goals and Review: Exercise Goals    Row Name 03/07/18 1531             Exercise Goals   Increase Physical Activity  Yes       Intervention  Provide advice, education, support and counseling about physical activity/exercise needs.;Develop an individualized exercise prescription for aerobic and  resistive training based on initial evaluation findings, risk stratification, comorbidities and participant's personal goals.       Expected Outcomes  Short Term: Attend rehab on a regular basis to increase  amount of physical activity.;Long Term: Add in home exercise to make exercise part of routine and to increase amount of physical activity.;Long Term: Exercising regularly at least 3-5 days a week.       Increase Strength and Stamina  Yes       Intervention  Provide advice, education, support and counseling about physical activity/exercise needs.;Develop an individualized exercise prescription for aerobic and resistive training based on initial evaluation findings, risk stratification, comorbidities and participant's personal goals.       Expected Outcomes  Short Term: Increase workloads from initial exercise prescription for resistance, speed, and METs.;Short Term: Perform resistance training exercises routinely during rehab and add in resistance training at home;Long Term: Improve cardiorespiratory fitness, muscular endurance and strength as measured by increased METs and functional capacity (6MWT)       Able to understand and use rate of perceived exertion (RPE) scale  Yes       Intervention  Provide education and explanation on how to use RPE scale       Expected Outcomes  Short Term: Able to use RPE daily in rehab to express subjective intensity level;Long Term:  Able to use RPE to guide intensity level when exercising independently       Able to understand and use Dyspnea scale  Yes       Intervention  Provide education and explanation on how to use Dyspnea scale       Expected Outcomes  Short Term: Able to use Dyspnea scale daily in rehab to express subjective sense of shortness of breath during exertion;Long Term: Able to use Dyspnea scale to guide intensity level when exercising independently       Knowledge and understanding of Target Heart Rate Range (THRR)  Yes       Intervention  Provide  education and explanation of THRR including how the numbers were predicted and where they are located for reference       Expected Outcomes  Short Term: Able to state/look up THRR;Short Term: Able to use daily as guideline for intensity in rehab;Long Term: Able to use THRR to govern intensity when exercising independently       Able to check pulse independently  Yes       Intervention  Provide education and demonstration on how to check pulse in carotid and radial arteries.;Review the importance of being able to check your own pulse for safety during independent exercise       Expected Outcomes  Short Term: Able to explain why pulse checking is important during independent exercise;Long Term: Able to check pulse independently and accurately       Understanding of Exercise Prescription  Yes       Intervention  Provide education, explanation, and written materials on patient's individual exercise prescription       Expected Outcomes  Short Term: Able to explain program exercise prescription;Long Term: Able to explain home exercise prescription to exercise independently          Exercise Goals Re-Evaluation : Exercise Goals Re-Evaluation    Row Name 03/08/18 0931 03/17/18 0813 03/22/18 0806 04/07/18 0955       Exercise Goal Re-Evaluation   Exercise Goals Review  Increase Physical Activity;Able to understand and use rate of perceived exertion (RPE) scale;Knowledge and understanding of Target Heart Rate Range (THRR);Understanding of Exercise Prescription;Increase Strength and Stamina;Able to understand and use Dyspnea scale  Increase Physical Activity;Able to understand and use rate of perceived exertion (RPE) scale;Knowledge and understanding of Target  Heart Rate Range (THRR);Understanding of Exercise Prescription;Increase Strength and Stamina;Able to understand and use Dyspnea scale  Increase Physical Activity;Increase Strength and Stamina;Understanding of Exercise Prescription  Increase Physical  Activity;Increase Strength and Stamina;Able to understand and use rate of perceived exertion (RPE) scale;Knowledge and understanding of Target Heart Rate Range (THRR)    Comments  Reviewed RPE scale, THR and program prescription with pt today.  Pt voiced understanding and was given a copy of goals to take home.   Reviewed home exercise with pt today.  Pt plans to walk at home and dumbbell exercises for exercise.  Reviewed THR, pulse, RPE, sign and symptoms, NTG use, and when to call 911 or MD.  Also discussed weather considerations and indoor options.  Pt voiced understanding.  Ricardo Brown is doing well in rehab.  He is walking at the mall for 5 laps each day which takes about 32mn.  He is beginning to get his strength and stamina back.  We will continue to work on progression.   BAbe Peopleis progressing well with exercise and has improved overall MET level.  He has just switched to 4 pm class.  Staff will monitor progress.    Expected Outcomes  Short: Use RPE daily to regulate intensity. Long: Follow program prescription in THR.  Short: exercise an additional 3 days at home Long: become comfortbale exercising regularly on his own   Short: Continue to walk on his off days.  Long: Continue to build strength and stamina.   Short - continue to attend consistently Long - Increase MET level and maintain fitness on his own       Discharge Exercise Prescription (Final Exercise Prescription Changes): Exercise Prescription Changes - 04/07/18 0900      Response to Exercise   Blood Pressure (Admit)  132/76    Blood Pressure (Exercise)  132/74    Blood Pressure (Exit)  118/68    Heart Rate (Admit)  88 bpm    Heart Rate (Exercise)  108 bpm    Heart Rate (Exit)  98 bpm    Rating of Perceived Exertion (Exercise)  13    Symptoms  none    Duration  Continue with 45 min of aerobic exercise without signs/symptoms of physical distress.    Intensity  THRR unchanged      Progression   Progression  Continue to progress  workloads to maintain intensity without signs/symptoms of physical distress.    Average METs  3.5      Resistance Training   Training Prescription  Yes    Weight  5 lb    Reps  10-15      Interval Training   Interval Training  No      Treadmill   MPH  3    Grade  1.5    Minutes  15    METs  3.92      REL-XR   Level  5    Minutes  15    METs  3.8      Home Exercise Plan   Plans to continue exercise at  Home (comment)   walks outside or at the mall, dumbbell exercises    Frequency  Add 3 additional days to program exercise sessions.    Initial Home Exercises Provided  03/17/18       Nutrition:  Target Goals: Understanding of nutrition guidelines, daily intake of sodium <15061m cholesterol <20071mcalories 30% from fat and 7% or less from saturated fats, daily to have 5 or more servings  of fruits and vegetables.  Biometrics: Pre Biometrics - 03/07/18 1531      Pre Biometrics   Height  5' 9.2" (1.758 m)    Weight  205 lb 12.8 oz (93.4 kg)    Waist Circumference  41.5 inches    Hip Circumference  38 inches    Waist to Hip Ratio  1.09 %    BMI (Calculated)  30.2    Single Leg Stand  2.57 seconds        Nutrition Therapy Plan and Nutrition Goals: Nutrition Therapy & Goals - 03/15/18 1139      Nutrition Therapy   Diet  DM    Protein (specify units)  12-13oz    Fiber  35 grams    Whole Grain Foods  3 servings   chooses whole grains semi-regularly   Saturated Fats  16 max. grams    Fruits and Vegetables  5 servings/day   8 ideal   Sodium  1500 grams      Personal Nutrition Goals   Nutrition Goal  When you return to work, try to keep the current early breakfast and mid-morning snack schedule that you've implemented, rather than waiting to eat and skipping a snack    Personal Goal #2  Start to become more aware of the sodium content of foods by reading nutrition facts labels more regularly, and choose lower sodium varieties IE less than 185m per serving     Personal Goal #3  Continue to practice portion control when it comes to starch-heavy foods. Using the ice cream scoop for reference is a great strategy, good job!    Comments  He has started to monitor his sodium intake since hospitalization. Does not drink sugar sweetened beverages and gets a variety of fiber and protein sources throughout the day. He has recently changed his morning eating schedule which is better suited for DM management      Intervention Plan   Intervention  Prescribe, educate and counsel regarding individualized specific dietary modifications aiming towards targeted core components such as weight, hypertension, lipid management, diabetes, heart failure and other comorbidities.;Nutrition handout(s) given to patient.   Low sodium nutrition therapy, Salt food swaps handouts   Expected Outcomes  Short Term Goal: Understand basic principles of dietary content, such as calories, fat, sodium, cholesterol and nutrients.;Short Term Goal: A plan has been developed with personal nutrition goals set during dietitian appointment.;Long Term Goal: Adherence to prescribed nutrition plan.       Nutrition Assessments: Nutrition Assessments - 03/07/18 1319      MEDFICTS Scores   Pre Score  108       Nutrition Goals Re-Evaluation: Nutrition Goals Re-Evaluation    Row Name 03/15/18 1203 03/22/18 0810           Goals   Nutrition Goal  When you return to work, try to keep the current early breakfast and mid-morning snack schedule that you've implemented, rather than waiting to eat and skipping a snack  Back to work on Sept 3.  Focus on breakfast and snacks. Reduce sodium.  Practice portion control.       Comment  He used to wait to eat breakfast until mid morning, but now he has been eating breakfast earlier and implementing a mid-morning snack, which he hopes to continue when he returns to work  His appetite is getting better overall. He is trying to get more breakfast and healthy snacks.   He is also working on better portion control.  He is focused on reading labels to find healthier options.       Expected Outcome  He will eat breakfast and a mid-morning snack on his work break regularly to improve BG control  Short: Continue to work on eating breakfast.  Long: Continue reduce portions.         Personal Goal #2 Re-Evaluation   Personal Goal #2  Start to become more aware of the sodium content of foods by reading nutrition facts labels more regularly, and choose lower sodium varieties IE less than 171m per serving  -        Personal Goal #3 Re-Evaluation   Personal Goal #3  Continue to practice portion control when it comes to starch-heavy foods. Using the ice cream scoop for reference is a great strategy, good job!  -         Nutrition Goals Discharge (Final Nutrition Goals Re-Evaluation): Nutrition Goals Re-Evaluation - 03/22/18 0810      Goals   Nutrition Goal  Back to work on Sept 3.  Focus on breakfast and snacks. Reduce sodium.  Practice portion control.     Comment  His appetite is getting better overall. He is trying to get more breakfast and healthy snacks.  He is also working on better portion control.   He is focused on reading labels to find healthier options.     Expected Outcome  Short: Continue to work on eating breakfast.  Long: Continue reduce portions.        Psychosocial: Target Goals: Acknowledge presence or absence of significant depression and/or stress, maximize coping skills, provide positive support system. Participant is able to verbalize types and ability to use techniques and skills needed for reducing stress and depression.   Initial Review & Psychosocial Screening: Initial Psych Review & Screening - 03/07/18 1320      Initial Review   Current issues with  None Identified      Family Dynamics   Good Support System?  Yes   Spouse, daughter     Barriers   Psychosocial barriers to participate in program  The patient should benefit from  training in stress management and relaxation.;There are no identifiable barriers or psychosocial needs.      Screening Interventions   Interventions  Encouraged to exercise;Provide feedback about the scores to participant;To provide support and resources with identified psychosocial needs    Expected Outcomes  Short Term goal: Utilizing psychosocial counselor, staff and physician to assist with identification of specific Stressors or current issues interfering with healing process. Setting desired goal for each stressor or current issue identified.;Long Term Goal: Stressors or current issues are controlled or eliminated.;Short Term goal: Identification and review with participant of any Quality of Life or Depression concerns found by scoring the questionnaire.;Long Term goal: The participant improves quality of Life and PHQ9 Scores as seen by post scores and/or verbalization of changes       Quality of Life Scores:  Quality of Life - 03/07/18 1322      Quality of Life   Select  Quality of Life      Quality of Life Scores   Health/Function Pre  15.47 %    Socioeconomic Pre  24 %    Psych/Spiritual Pre  22.5 %    Family Pre  24 %    GLOBAL Pre  19.93 %      Scores of 19 and below usually indicate a poorer quality of life in these areas.  A difference of  2-3 points is a clinically meaningful difference.  A difference of 2-3 points in the total score of the Quality of Life Index has been associated with significant improvement in overall quality of life, self-image, physical symptoms, and general health in studies assessing change in quality of life.  PHQ-9: Recent Review Flowsheet Data    Depression screen [facility] 2/9 04/12/2018 03/31/2018 03/07/2018   Decreased Interest 0 0 0   Down, Depressed, Hopeless 0 0 0   PHQ - 2 Score 0 0 0   Altered sleeping '1 2 2   ' Tired, decreased energy 0 0 2   Change in appetite 0 0 2   Feeling bad or failure about yourself  0 0 0   Trouble concentrating 0 0 0    Moving slowly or fidgety/restless 0 0 0   Suicidal thoughts 0 0 0   PHQ-9 Score '1 2 6   ' Difficult doing work/chores Not difficult at all Somewhat difficult Somewhat difficult     Interpretation of Total Score  Total Score Depression Severity:  1-4 = Minimal depression, 5-9 = Mild depression, 10-14 = Moderate depression, 15-19 = Moderately severe depression, 20-27 = Severe depression   Psychosocial Evaluation and Intervention: Psychosocial Evaluation - 03/17/18 0956      Psychosocial Evaluation & Interventions   Interventions  Relaxation education;Stress management education;Encouraged to exercise with the program and follow exercise prescription    Comments  Counselor met with [facility]) today for initial psychosocial evaluation.  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  Ricardo Brown has a strong support system with a spouse of 86 years and (3) adult children as well as a brother locally and a 47 year old grandson who lives in the home.  Ricardo Brown reports having difficulty sleeping since the surgery and is hoping exercise will help with that.  His appetite has improved lately.  Ricardo Brown denies a history of depression or anxiety or any current symptoms and states he is typically in a positive mood most of the time.  Ricardo Brown's stress currently are finances; his health; and his 77 year old grandson lives there and is having some behavioral problems.  He has goals to get back to work soon due to finances; to increase his strength; eat healthier and manage his stress better.  Counselor recommended speaking to his Dr/Pharmacist about an OTC sleep aid to improve his sleep quality and quantity.  Staff will follow with [facility].      Expected Outcomes  Short:  Ricardo Brown will talk with his Dr/Pharmacist about a natural sleep aid to help with his recent intermittent sleep issues.  He will exercise more consistently to help with stress and sleep concerns.  Long:  Ricardo Brown will  develop a habit of exercise; healthy diet and positive self-care to manage his stress.    Continue Psychosocial Services   Follow up required by staff       Psychosocial Re-Evaluation: Psychosocial Re-Evaluation    [place] Name 03/22/18 0810             Psychosocial Re-Evaluation   Current issues with  Current Sleep Concerns;Current Stress Concerns       Comments  Ricardo Brown has gotten back to sleeping well.  He is feeling much better. He continues to remain postive overall.  He is enjoying the exercise and losing weight.         Expected Outcomes  Short: Continue to attend classes  Long: Continue to remain positive.        Interventions  Encouraged to attend Cardiac Rehabilitation for the exercise       Continue Psychosocial Services   Follow up required by staff          Psychosocial Discharge (Final Psychosocial Re-Evaluation): Psychosocial Re-Evaluation - 03/22/18 0810      Psychosocial Re-Evaluation   Current issues with  Current Sleep Concerns;Current Stress Concerns    Comments  Ricardo Brown has gotten back to sleeping well.  He is feeling much better. He continues to remain postive overall.  He is enjoying the exercise and losing weight.      Expected Outcomes  Short: Continue to attend classes  Long: Continue to remain positive.     Interventions  Encouraged to attend Cardiac Rehabilitation for the exercise    Continue Psychosocial Services   Follow up required by staff       Vocational Rehabilitation: Provide vocational rehab assistance to qualifying candidates.   Vocational Rehab Evaluation & Intervention: Vocational Rehab - 03/07/18 1327      Initial Vocational Rehab Evaluation & Intervention   Assessment shows need for Vocational Rehabilitation  No       Education: Education Goals: Education classes will be provided on a variety of topics geared toward better understanding of heart health and risk factor modification. Participant will state understanding/return  demonstration of topics presented as noted by education test scores.  Learning Barriers/Preferences: Learning Barriers/Preferences - 03/07/18 1324      Learning Barriers/Preferences   Learning Barriers  None    Learning Preferences  None       Education Topics:  AED/CPR: - Group verbal and written instruction with the use of models to demonstrate the basic use of the AED with the basic ABC's of resuscitation.   General Nutrition Guidelines/Fats and Fiber: -Group instruction provided by verbal, written material, models and posters to present the general guidelines for heart healthy nutrition. Gives an explanation and review of dietary fats and fiber.   Cardiac Rehab from 04/11/2018 in [facility] Cardiac and Pulmonary Rehab  Date  04/11/18  Educator  LB  Instruction Review Code  1- Verbalizes Understanding      Controlling Sodium/Reading Food Labels: -Group verbal and written material supporting the discussion of sodium use in heart healthy nutrition. Review and explanation with models, verbal and written materials for utilization of the food label.   Exercise Physiology & General Exercise Guidelines: - Group verbal and written instruction with models to review the exercise physiology of the cardiovascular system and associated critical values. Provides general exercise guidelines with specific guidelines to those with heart or lung disease.    Aerobic Exercise & Resistance Training: - Gives group verbal and written instruction on the various components of exercise. Focuses on aerobic and resistive training programs and the benefits of this training and how to safely progress through these programs..   Cardiac Rehab from 04/11/2018 in [facility] Cardiac and Pulmonary Rehab  Date  03/10/18  Educator  AS  Instruction Review Code  1- Verbalizes Understanding      Flexibility, Balance, Mind/Body Relaxation: Provides group verbal/written instruction on the benefits of flexibility and balance  training, including mind/body exercise modes such as yoga, pilates and tai chi.  Demonstration and skill practice provided.   Cardiac Rehab from 04/11/2018 in [facility] Cardiac and Pulmonary Rehab  Date  03/15/18  Educator  AS  Instruction Review Code  1- Verbalizes Understanding      Stress and  Anxiety: - Provides group verbal and written instruction about the health risks of elevated stress and causes of high stress.  Discuss the correlation between heart/lung disease and anxiety and treatment options. Review healthy ways to manage with stress and anxiety.   Depression: - Provides group verbal and written instruction on the correlation between heart/lung disease and depressed mood, treatment options, and the stigmas associated with seeking treatment.   Cardiac Rehab from 04/11/2018 in [facility] Cardiac and Pulmonary Rehab  Date  03/08/18  Educator  [facility]  Instruction Review Code  1- Verbalizes Understanding      Anatomy & Physiology of the Heart: - Group verbal and written instruction and models provide basic cardiac anatomy and physiology, with the coronary electrical and arterial systems. Review of Valvular disease and Heart Failure   Cardiac Procedures: - Group verbal and written instruction to review commonly prescribed medications for heart disease. Reviews the medication, class of the drug, and side effects. Includes the steps to properly store meds and maintain the prescription regimen. (beta blockers and nitrates)   Cardiac Medications I: - Group verbal and written instruction to review commonly prescribed medications for heart disease. Reviews the medication, class of the drug, and side effects. Includes the steps to properly store meds and maintain the prescription regimen.   Cardiac Rehab from 04/11/2018 in [facility] Cardiac and Pulmonary Rehab  Date  03/29/18  Educator  SB  Instruction Review Code  1- Verbalizes Understanding      Cardiac Medications II: -Group verbal and written  instruction to review commonly prescribed medications for heart disease. Reviews the medication, class of the drug, and side effects. (all other drug classes)   Cardiac Rehab from 04/11/2018 in [facility] Cardiac and Pulmonary Rehab  Date  03/17/18  Educator  CE  Instruction Review Code  1- Verbalizes Understanding       Go Sex-Intimacy & Heart Disease, Get SMART - Goal Setting: - Group verbal and written instruction through game format to discuss heart disease and the return to sexual intimacy. Provides group verbal and written material to discuss and apply goal setting through the application of the S.M.A.R.T. Method.   Other Matters of the Heart: - Provides group verbal, written materials and models to describe Stable Angina and Peripheral Artery. Includes description of the disease process and treatment options available to the cardiac patient.   Exercise & Equipment Safety: - Individual verbal instruction and demonstration of equipment use and safety with use of the equipment.   Cardiac Rehab from 04/11/2018 in [facility] Cardiac and Pulmonary Rehab  Date  03/07/18  Educator  Sb  Instruction Review Code  1- Verbalizes Understanding      Infection Prevention: - Provides verbal and written material to individual with discussion of infection control including proper hand washing and proper equipment cleaning during exercise session.   Cardiac Rehab from 04/11/2018 in [facility] Cardiac and Pulmonary Rehab  Date  03/07/18  Educator  SB  Instruction Review Code  1- Verbalizes Understanding      Falls Prevention: - Provides verbal and written material to individual with discussion of falls prevention and safety.   Cardiac Rehab from 04/11/2018 in [facility] Cardiac and Pulmonary Rehab  Date  03/07/18  Educator  SB  Instruction Review Code  1- Verbalizes Understanding      Diabetes: - Individual verbal and written instruction to review signs/symptoms of diabetes, desired ranges of glucose level fasting,  after meals and with exercise. Acknowledge that pre and post exercise glucose checks will  be done for 3 sessions at entry of program.   Know Your Numbers and Risk Factors: -Group verbal and written instruction about important numbers in your health.  Discussion of what are risk factors and how they play a role in the disease process.  Review of Cholesterol, Blood Pressure, Diabetes, and BMI and the role they play in your overall health.   Cardiac Rehab from 04/11/2018 in [facility] Cardiac and Pulmonary Rehab  Date  03/17/18  Educator  CE  Instruction Review Code  1- Verbalizes Understanding      Sleep Hygiene: -Provides group verbal and written instruction about how sleep can affect your health.  Define sleep hygiene, discuss sleep cycles and impact of sleep habits. Review good sleep hygiene tips.    Cardiac Rehab from 04/11/2018 in [facility] Cardiac and Pulmonary Rehab  Date  04/06/18  Educator  [facility]  Instruction Review Code  1- Verbalizes Understanding      Other: -Provides group and verbal instruction on various topics (see comments)   Knowledge Questionnaire Score: Knowledge Questionnaire Score - 03/07/18 1325      Knowledge Questionnaire Score   Pre Score  21/26   Reviewed corret responses with Ricardo Brown today. He verbalized understanding of the responses. Areas missed Angina, PAD ,Exercise and Nutrition      Core Components/Risk Factors/Patient Goals at Admission: Personal Goals and Risk Factors at Admission - 03/07/18 1319      Core Components/Risk Factors/Patient Goals on Admission    Weight Management  Yes;Weight Gain    Intervention  Weight Management: Develop a combined nutrition and exercise program designed to reach desired caloric intake, while maintaining appropriate intake of nutrient and fiber, sodium and fats, and appropriate energy expenditure required for the weight goal.    Admit Weight  205 lb 12.8 oz (93.4 kg)    Goal Weight: Short Term  210 lb (95.3 kg)    Goal Weight:  Long Term  220 lb (99.8 kg)    Expected Outcomes  Short Term: Continue to assess and modify interventions until short term weight is achieved;Long Term: Adherence to nutrition and physical activity/exercise program aimed toward attainment of established weight goal;Weight Gain: Understanding of general recommendations for a high calorie, high protein meal plan that promotes weight gain by distributing calorie intake throughout the day with the consumption for 4-5 meals, snacks, and/or supplements    Diabetes  Yes    Intervention  Provide education about signs/symptoms and action to take for hypo/hyperglycemia.;Provide education about proper nutrition, including hydration, and aerobic/resistive exercise prescription along with prescribed medications to achieve blood glucose in normal ranges: Fasting glucose 65-99 mg/dL    Expected Outcomes  Short Term: Participant verbalizes understanding of the signs/symptoms and immediate care of hyper/hypoglycemia, proper foot care and importance of medication, aerobic/resistive exercise and nutrition plan for blood glucose control.;Long Term: Attainment of HbA1C < 7%.    Hypertension  Yes    Intervention  Provide education on lifestyle modifcations including regular physical activity/exercise, weight management, moderate sodium restriction and increased consumption of fresh fruit, vegetables, and low fat dairy, alcohol moderation, and smoking cessation.;Monitor prescription use compliance.    Expected Outcomes  Short Term: Continued assessment and intervention until BP is < 140/39m HG in hypertensive participants. < 130/811mHG in hypertensive participants with diabetes, heart failure or chronic kidney disease.;Long Term: Maintenance of blood pressure at goal levels.    Lipids  Yes    Intervention  Provide education and support for participant on nutrition & aerobic/resistive exercise  along with prescribed medications to achieve LDL <30m, HDL >457m    Expected  Outcomes  Short Term: Participant states understanding of desired cholesterol values and is compliant with medications prescribed. Participant is following exercise prescription and nutrition guidelines.;Long Term: Cholesterol controlled with medications as prescribed, with individualized exercise RX and with personalized nutrition plan. Value goals: LDL < 7014mHDL > 40 mg.       Core Components/Risk Factors/Patient Goals Review:  Goals and Risk Factor Review    Row Name 03/22/18 0807             Core Components/Risk Factors/Patient Goals Review   Personal Goals Review  Weight Management/Obesity;Hypertension;Lipids       Review  BilAbe Brown a run of SVT over weekend for about 48m81mnd dropped his blood pressure.  He has a call out to the cardiologist. He is losing a little a weight.  Today he is 201 but has been as low as 198lbs.  Overall his blood pressures are doing well and he checks them at home frequently.  He is doing well on his medications.        Expected Outcomes  Short: Make sure he hears back from cardiologist.  Long: Continue to work on weight loss.           Core Components/Risk Factors/Patient Goals at Discharge (Final Review):  Goals and Risk Factor Review - 03/22/18 0807      Core Components/Risk Factors/Patient Goals Review   Personal Goals Review  Weight Management/Obesity;Hypertension;Lipids    Review  BillAbe Brown a run of SVT over weekend for about 48mi33md dropped his blood pressure.  He has a call out to the cardiologist. He is losing a little a weight.  Today he is 201 but has been as low as 198lbs.  Overall his blood pressures are doing well and he checks them at home frequently.  He is doing well on his medications.     Expected Outcomes  Short: Make sure he hears back from cardiologist.  Long: Continue to work on weight loss.        ITP Comments: ITP Comments    Row Name 03/16/18 0812 03/22/18 1150 04/13/18 0556       ITP Comments  30 day review completed.  ITP sent to Dr. Bert Ramonita Labering for Dr. Mark Emily Filbertical Director of Cardiac Rehab. Continue with ITP unless changes are made by physician.  New to program  During education, Ricardo Brown that he was having palpitations again like he did over the weekend.  We hooked him back up on the monitor and he was in SVT.  He had called cardiology yesterday with no return call.  RN called office today during education. Ricardo Peoplesent up to his cardiologist after class.   30 day review completed. ITP sent to Dr. Mark Emily Filbertical Director of Cardiac Rehab. Continue with ITP unless changes are made by physician        Comments:

## 2018-04-13 NOTE — Progress Notes (Signed)
Daily Session Note  Patient Details  Name: JLEN WINTLE MRN: [id number] Date of Birth: 1960/12/28 Referring Provider:     Cardiac Rehab from 03/07/2018 in [facility] Cardiac and Pulmonary Rehab  Referring Provider  Isaias Cowman MD      Encounter Date: 04/13/2018  Check In: Session Check In - 04/13/18 1750      Check-In   Supervising physician immediately available to respond to emergencies  See telemetry face sheet for immediately available ER MD    Location  [facility]-Cardiac & Pulmonary Rehab    Staff Present  Renita Papa, RN Vickki Hearing, BA, ACSM CEP, Exercise Physiologist;Carroll Enterkin, RN, BSN    Medication changes reported      No    Fall or balance concerns reported     No    Warm-up and Cool-down  Performed on first and last piece of equipment    Resistance Training Performed  Yes    VAD Patient?  No    PAD/SET Patient?  No      Pain Assessment   Currently in Pain?  No/denies    Multiple Pain Sites  No          Social History   Tobacco Use  Smoking Status Never Smoker  Smokeless Tobacco Never Used    Goals Met:  Independence with exercise equipment Exercise tolerated well No report of cardiac concerns or symptoms Strength training completed today  Goals Unmet:  Not Applicable  Comments: Pt able to follow exercise prescription today without complaint.  Will continue to monitor for progression.    Dr. Emily Filbert is Medical Director for [place] and [name].

## 2018-04-20 ENCOUNTER — Encounter: Payer: BLUE CROSS/BLUE SHIELD | Admitting: *Deleted

## 2018-04-20 DIAGNOSIS — Z9889 Other specified postprocedural states: Secondary | ICD-10-CM

## 2018-04-20 DIAGNOSIS — Z951 Presence of aortocoronary bypass graft: Secondary | ICD-10-CM | POA: Diagnosis not present

## 2018-04-20 DIAGNOSIS — I214 Non-ST elevation (NSTEMI) myocardial infarction: Secondary | ICD-10-CM

## 2018-04-20 NOTE — Progress Notes (Signed)
Daily Session Note  Patient Details  Name: Ricardo Brown MRN: [id number] Date of Birth: 1961-06-23 Referring Provider:     Cardiac Rehab from 03/07/2018 in [facility] Cardiac and Pulmonary Rehab  Referring Provider  Isaias Cowman MD      Encounter Date: 04/20/2018  Check In: Session Check In - 04/20/18 1703      Check-In   Supervising physician immediately available to respond to emergencies  See telemetry face sheet for immediately available ER MD    Location  [facility]-Cardiac & Pulmonary Rehab    Staff Present  Renita Papa, RN Vickki Hearing, BA, ACSM CEP, Exercise Physiologist;Carroll Enterkin, RN, BSN    Medication changes reported      No    Fall or balance concerns reported     No    Tobacco Cessation  No Change    Warm-up and Cool-down  Performed on first and last piece of equipment    Resistance Training Performed  Yes    VAD Patient?  No    PAD/SET Patient?  No      Pain Assessment   Currently in Pain?  No/denies          Social History   Tobacco Use  Smoking Status Never Smoker  Smokeless Tobacco Never Used    Goals Met:  Independence with exercise equipment Exercise tolerated well No report of cardiac concerns or symptoms Strength training completed today  Goals Unmet:  Not Applicable  Comments: Pt able to follow exercise prescription today without complaint.  Will continue to monitor for progression.    Dr. Emily Filbert is Medical Director for [place] and [name].

## 2018-04-21 ENCOUNTER — Encounter: Payer: BLUE CROSS/BLUE SHIELD | Admitting: *Deleted

## 2018-04-21 DIAGNOSIS — Z951 Presence of aortocoronary bypass graft: Secondary | ICD-10-CM

## 2018-04-21 DIAGNOSIS — I214 Non-ST elevation (NSTEMI) myocardial infarction: Secondary | ICD-10-CM

## 2018-04-21 DIAGNOSIS — Z9889 Other specified postprocedural states: Secondary | ICD-10-CM

## 2018-04-21 NOTE — Progress Notes (Signed)
Daily Session Note  Patient Details  Name: Ricardo Brown MRN: [id number] Date of Birth: August 14, 1960 Referring Provider:     Cardiac Rehab from 03/07/2018 in [facility] Cardiac and Pulmonary Rehab  Referring Provider  Isaias Cowman MD      Encounter Date: 04/21/2018  Check In: Session Check In - 04/21/18 1641      Check-In   Supervising physician immediately available to respond to emergencies  See telemetry face sheet for immediately available ER MD    Location  [facility]-Cardiac & Pulmonary Rehab    Staff Present  Justin Mend RCP,RRT,BSRT;Meredith Sherryll Burger, RN Moises Blood, BS, ACSM CEP, Exercise Physiologist    Medication changes reported      No    Fall or balance concerns reported     No    Tobacco Cessation  No Change    Warm-up and Cool-down  Performed on first and last piece of equipment    Resistance Training Performed  Yes    VAD Patient?  No    PAD/SET Patient?  No      Pain Assessment   Currently in Pain?  No/denies    Multiple Pain Sites  No          Social History   Tobacco Use  Smoking Status Never Smoker  Smokeless Tobacco Never Used    Goals Met:  Independence with exercise equipment Exercise tolerated well Personal goals reviewed No report of cardiac concerns or symptoms Strength training completed today  Goals Unmet:  Not Applicable  Comments: Pt able to follow exercise prescription today without complaint.  Will continue to monitor for progression.    Dr. Emily Filbert is Medical Director for [place] and [name].

## 2018-04-26 DIAGNOSIS — I471 Supraventricular tachycardia: Secondary | ICD-10-CM | POA: Diagnosis not present

## 2018-04-26 DIAGNOSIS — R002 Palpitations: Secondary | ICD-10-CM | POA: Diagnosis not present

## 2018-04-26 DIAGNOSIS — E1165 Type 2 diabetes mellitus with hyperglycemia: Secondary | ICD-10-CM | POA: Diagnosis not present

## 2018-04-26 DIAGNOSIS — E119 Type 2 diabetes mellitus without complications: Secondary | ICD-10-CM | POA: Diagnosis not present

## 2018-04-27 ENCOUNTER — Encounter: Payer: BLUE CROSS/BLUE SHIELD | Admitting: *Deleted

## 2018-04-27 DIAGNOSIS — Z951 Presence of aortocoronary bypass graft: Secondary | ICD-10-CM

## 2018-04-27 DIAGNOSIS — I214 Non-ST elevation (NSTEMI) myocardial infarction: Secondary | ICD-10-CM

## 2018-04-27 NOTE — Progress Notes (Signed)
Daily Session Note  Patient Details  Name: Ricardo Brown MRN: [id number] Date of Birth: 1961/06/13 Referring Provider:     Cardiac Rehab from 03/07/2018 in [facility] Cardiac and Pulmonary Rehab  Referring Provider  Isaias Cowman MD      Encounter Date: 04/27/2018  Check In: Session Check In - 04/27/18 1701      Check-In   Supervising physician immediately available to respond to emergencies  See telemetry face sheet for immediately available ER MD    Location  [facility]-Cardiac & Pulmonary Rehab    Staff Present  Renita Papa, RN Vickki Hearing, BA, ACSM CEP, Exercise Physiologist;Reid Regas, RN, BSN    Medication changes reported      No    Fall or balance concerns reported     No    Tobacco Cessation  No Change    Warm-up and Cool-down  Performed on first and last piece of equipment    Resistance Training Performed  Yes    VAD Patient?  No    PAD/SET Patient?  No      Pain Assessment   Currently in Pain?  No/denies    Multiple Pain Sites  No          Social History   Tobacco Use  Smoking Status Never Smoker  Smokeless Tobacco Never Used    Goals Met:  Proper associated with RPD/PD & O2 Sat Exercise tolerated well No report of cardiac concerns or symptoms Strength training completed today  Goals Unmet:  Not Applicable  Comments:     Dr. Emily Filbert is Medical Director for [name] and [name].

## 2018-04-28 DIAGNOSIS — Z951 Presence of aortocoronary bypass graft: Secondary | ICD-10-CM

## 2018-04-28 NOTE — Progress Notes (Signed)
Daily Session Note  Patient Details  Name: Ricardo Brown MRN: [id number] Date of Birth: Oct 27, 1960 Referring Provider:     Cardiac Rehab from 03/07/2018 in [facility] Cardiac and Pulmonary Rehab  Referring Provider  Isaias Cowman MD      Encounter Date: 04/28/2018  Check In: Session Check In - 04/28/18 1630      Check-In   Supervising physician immediately available to respond to emergencies  See telemetry face sheet for immediately available ER MD    Location  [facility]-Cardiac & Pulmonary Rehab    Staff Present  Justin Mend RCP,RRT,BSRT;Meredith Sherryll Burger, RN BSN;Leslie Castrejon RN, Moises Blood, BS, ACSM CEP, Exercise Physiologist    Medication changes reported      No    Fall or balance concerns reported     No    Warm-up and Cool-down  Performed on first and last piece of equipment    Resistance Training Performed  Yes    VAD Patient?  No    PAD/SET Patient?  No      Pain Assessment   Currently in Pain?  No/denies          Social History   Tobacco Use  Smoking Status Never Smoker  Smokeless Tobacco Never Used    Goals Met:  Independence with exercise equipment Exercise tolerated well No report of cardiac concerns or symptoms Strength training completed today  Goals Unmet:  Not Applicable  Comments: Pt able to follow exercise prescription today without complaint.  Will continue to monitor for progression.    Dr. Emily Filbert is Medical Director for [place] and [name].

## 2018-05-03 DIAGNOSIS — R002 Palpitations: Secondary | ICD-10-CM | POA: Diagnosis not present

## 2018-05-03 DIAGNOSIS — I471 Supraventricular tachycardia: Secondary | ICD-10-CM | POA: Diagnosis not present

## 2018-05-03 DIAGNOSIS — E119 Type 2 diabetes mellitus without complications: Secondary | ICD-10-CM | POA: Diagnosis not present

## 2018-05-03 DIAGNOSIS — E1165 Type 2 diabetes mellitus with hyperglycemia: Secondary | ICD-10-CM | POA: Diagnosis not present

## 2018-05-04 ENCOUNTER — Encounter: Payer: BLUE CROSS/BLUE SHIELD | Attending: Cardiology

## 2018-05-04 DIAGNOSIS — Z951 Presence of aortocoronary bypass graft: Secondary | ICD-10-CM | POA: Insufficient documentation

## 2018-05-11 ENCOUNTER — Encounter: Payer: Self-pay | Admitting: *Deleted

## 2018-05-11 DIAGNOSIS — Z9889 Other specified postprocedural states: Secondary | ICD-10-CM

## 2018-05-11 DIAGNOSIS — Z951 Presence of aortocoronary bypass graft: Secondary | ICD-10-CM

## 2018-05-11 DIAGNOSIS — I214 Non-ST elevation (NSTEMI) myocardial infarction: Secondary | ICD-10-CM

## 2018-05-11 NOTE — Progress Notes (Signed)
Cardiac Individual Treatment Plan  Patient Details  Name: Ricardo Brown MRN: [id number] Date of Birth: 16-Feb-1961 Referring Provider:     Cardiac Rehab from 03/07/2018 in [facility] Cardiac and Pulmonary Rehab  Referring Provider  Isaias Cowman MD      Initial Encounter Date:    Cardiac Rehab from 03/07/2018 in [facility] Cardiac and Pulmonary Rehab  Date  03/07/18      Visit Diagnosis: S/P CABG x 3  NSTEMI (non-ST elevated myocardial infarction) ([place])  S/P mitral valve repair  Patient's Home Medications on Admission:  Current Outpatient Medications:  .  aspirin 81 MG chewable tablet, Chew by mouth., Disp: , Rfl:  .  Fenofibrate 120 MG TABS, Take by mouth., Disp: , Rfl:  .  metFORMIN (GLUCOPHAGE) 1000 MG tablet, Take by mouth., Disp: , Rfl:  .  metoprolol succinate (TOPROL-XL) 25 MG 24 hr tablet, TAKE 1 2 (ONE HALF) TABLET BY MOUTH ONCE DAILY, Disp: , Rfl: 2 .  Omega-3 Fatty Acids (FISH OIL) 875 MG CAPS, Take 1 capsule by mouth 2 (two) times daily., Disp: , Rfl:  .  pseudoephedrine (SUDAFED) 30 MG tablet, Take 30 mg by mouth every 4 (four) hours as needed for congestion., Disp: , Rfl:  .  rosuvastatin (CRESTOR) 5 MG tablet, Take by mouth., Disp: , Rfl:  .  TRESIBA FLEXTOUCH 100 UNIT/ML SOPN FlexTouch Pen, , Disp: , Rfl: 2  Past Medical History: Past Medical History:  Diagnosis Date  . Diabetes mellitus without complication ([place])    a. Dx ~ 2000.  [name] Heart palpitations    a. Pt reports prior nl echo's and stress tests. Last stress test w/in past 2 yrs - PCP.  [name] High cholesterol     Tobacco Use: Social History   Tobacco Use  Smoking Status Never Smoker  Smokeless Tobacco Never Used    Labs: Recent Review Scientist, physiological    Labs for ITP Cardiac and Pulmonary Rehab Latest Ref Rng & Units 01/24/2018   Cholestrol 0 - 200 mg/dL 163   LDLCALC 0 - 99 mg/dL 116(H)   HDL >40 mg/dL 23(L)   Trlycerides <150 mg/dL 120       Exercise Target Goals: Exercise Program  Goal: Individual exercise prescription set using results from initial 6 min walk test and THRR while considering  patient's activity barriers and safety.   Exercise Prescription Goal: Initial exercise prescription builds to 30-45 minutes a day of aerobic activity, 2-3 days per week.  Home exercise guidelines will be given to patient during program as part of exercise prescription that the participant will acknowledge.  Activity Barriers & Risk Stratification: Activity Barriers & Cardiac Risk Stratification - 03/07/18 1318      Activity Barriers & Cardiac Risk Stratification   Activity Barriers  Joint Problems;Deconditioning;Muscular Weakness;Balance Concerns   Hips hurt when bends over, occasional shoulder pain when reaching overhead   Cardiac Risk Stratification  High       6 Minute Walk: 6 Minute Walk    Row Name 03/07/18 1528         6 Minute Walk   Phase  Initial     Distance  1294 feet     Walk Time  6 minutes     # of Rest Breaks  0     MPH  2.45     METS  3.7     RPE  10     VO2 Peak  12.95     Symptoms  No  Resting HR  88 bpm     Resting BP  136/66     Resting Oxygen Saturation   99 %     Exercise Oxygen Saturation  during 6 min walk  99 %     Max Ex. HR  102 bpm     Max Ex. BP  152/74     2 Minute Post BP  122/66        Oxygen Initial Assessment:   Oxygen Re-Evaluation:   Oxygen Discharge (Final Oxygen Re-Evaluation):   Initial Exercise Prescription: Initial Exercise Prescription - 03/07/18 1500      Date of Initial Exercise RX and Referring Provider   Date  03/07/18    Referring Provider  Paraschos, Alexander MD      Treadmill   MPH  2.5    Grade  1    Minutes  15    METs  3.26      REL-XR   Level  3    Speed  50    Minutes  15    METs  3      T5 Nustep   Level  3    SPM  80    Minutes  15    METs  3      Prescription Details   Frequency (times per week)  2    Duration  Progress to 30 minutes of continuous aerobic without  signs/symptoms of physical distress      Intensity   THRR 40-80% of Max Heartrate  118-149    Ratings of Perceived Exertion  11-13    Perceived Dyspnea  0-4      Progression   Progression  Continue to progress workloads to maintain intensity without signs/symptoms of physical distress.      Resistance Training   Training Prescription  Yes    Weight  4 lbs    Reps  10-15       Perform Capillary Blood Glucose checks as needed.  Exercise Prescription Changes: Exercise Prescription Changes    Row Name 03/07/18 1300 03/08/18 1500 03/17/18 0800 03/22/18 1500 04/07/18 0900     Response to Exercise   Blood Pressure (Admit)  136/66  134/72  -  120/72  132/76   Blood Pressure (Exercise)  152/74  148/70  -  156/70  132/74   Blood Pressure (Exit)  122/66  122/64  -  124/72  118/68   Heart Rate (Admit)  88 bpm  95 bpm  -  91 bpm  88 bpm   Heart Rate (Exercise)  101 bpm  115 bpm  -  111 bpm  108 bpm   Heart Rate (Exit)  91 bpm  98 bpm  -  96 bpm  98 bpm   Oxygen Saturation (Admit)  99 %  -  -  -  -   Oxygen Saturation (Exercise)  99 %  -  -  -  -   Rating of Perceived Exertion (Exercise)  10  14  -  13  13   Symptoms  none  SOB  -  none  none   Comments  walk test results  first full day of exercise  -  -  -   Duration  -  Continue with 30 min of aerobic exercise without signs/symptoms of physical distress.  -  Continue with 30 min of aerobic exercise without signs/symptoms of physical distress.  Continue with 45 min of aerobic exercise without signs/symptoms of physical distress.  Intensity  -  THRR unchanged  -  THRR unchanged  THRR unchanged     Progression   Progression  -  Continue to progress workloads to maintain intensity without signs/symptoms of physical distress.  -  Continue to progress workloads to maintain intensity without signs/symptoms of physical distress.  Continue to progress workloads to maintain intensity without signs/symptoms of physical distress.   Average METs  -   2.83  -  2.94  3.5     Resistance Training   Training Prescription  -  Yes  -  Yes  Yes   Weight  -  4 lbs  -  5 lbs  5 lb   Reps  -  10-15  -  10-15  10-15     Interval Training   Interval Training  -  No  -  No  No     Treadmill   MPH  -  2.5  -  3  3   Grade  -  1  -  1  1.5   Minutes  -  15  -  15  15   METs  -  3.26  -  3.71  3.92     REL-XR   Level  -  -  -  5  5   Minutes  -  -  -  15  15   METs  -  -  -  2.4  3.8     T5 Nustep   Level  -  3  -  3  -   Minutes  -  15  -  15  -   METs  -  2.4  -  2.7  -     Home Exercise Plan   Plans to continue exercise at  -  -  Home (comment) walks outside or at the mall, dumbbell exercises   Home (comment) walks outside or at the mall, dumbbell exercises   Home (comment) walks outside or at the mall, dumbbell exercises    Frequency  -  -  Add 3 additional days to program exercise sessions.  Add 3 additional days to program exercise sessions.  Add 3 additional days to program exercise sessions.   Initial Home Exercises Provided  -  -  03/17/18  03/17/18  03/17/18   Row Name 04/22/18 1100 05/05/18 0800           Response to Exercise   Blood Pressure (Admit)  108/54  124/62      Blood Pressure (Exercise)  142/68  144/76      Blood Pressure (Exit)  120/60  122/60      Heart Rate (Admit)  84 bpm  91 bpm      Heart Rate (Exercise)  103 bpm  111 bpm      Heart Rate (Exit)  94 bpm  98 bpm      Rating of Perceived Exertion (Exercise)  14  14      Symptoms  none  none      Duration  Continue with 45 min of aerobic exercise without signs/symptoms of physical distress.  Continue with 45 min of aerobic exercise without signs/symptoms of physical distress.      Intensity  THRR unchanged  THRR unchanged        Progression   Progression  Continue to progress workloads to maintain intensity without signs/symptoms of physical distress.  Continue to progress workloads to maintain intensity without signs/symptoms of physical distress.  Average METs  3.7  3.9        Resistance Training   Training Prescription  Yes  Yes      Weight  5 lb  8 lb      Reps  10-15  10-15        Interval Training   Interval Training  No  No        Treadmill   MPH  3  3.2      Grade  4  2.5      Minutes  15  15      METs  4.95  4.95        REL-XR   Level  5  5      Minutes  15  15      METs  3.4  3.7        T5 Nustep   Level  3  3      SPM  80  80      Minutes  15  15      METs  2.8  3        Home Exercise Plan   Plans to continue exercise at  Home (comment) walks outside or at the mall, dumbbell exercises   Home (comment) walks outside or at the mall, dumbbell exercises       Frequency  Add 3 additional days to program exercise sessions.  Add 3 additional days to program exercise sessions.      Initial Home Exercises Provided  03/17/18  03/17/18         Exercise Comments: Exercise Comments    Row Name 03/08/18 [phone]           Exercise Comments  First full day of exercise!  Patient was oriented to gym and equipment including functions, settings, policies, and procedures.  Patient's individual exercise prescription and treatment plan were reviewed.  All starting workloads were established based on the results of the 6 minute walk test done at initial orientation visit.  The plan for exercise progression was also introduced and progression will be customized based on patient's performance and goals.          Exercise Goals and Review: Exercise Goals    Row Name 03/07/18 1531             Exercise Goals   Increase Physical Activity  Yes       Intervention  Provide advice, education, support and counseling about physical activity/exercise needs.;Develop an individualized exercise prescription for aerobic and resistive training based on initial evaluation findings, risk stratification, comorbidities and participant's personal goals.       Expected Outcomes  Short Term: Attend rehab on a regular basis to increase amount of  physical activity.;Long Term: Add in home exercise to make exercise part of routine and to increase amount of physical activity.;Long Term: Exercising regularly at least 3-5 days a week.       Increase Strength and Stamina  Yes       Intervention  Provide advice, education, support and counseling about physical activity/exercise needs.;Develop an individualized exercise prescription for aerobic and resistive training based on initial evaluation findings, risk stratification, comorbidities and participant's personal goals.       Expected Outcomes  Short Term: Increase workloads from initial exercise prescription for resistance, speed, and METs.;Short Term: Perform resistance training exercises routinely during rehab and add in resistance training at home;Long Term: Improve cardiorespiratory fitness, muscular endurance and strength as measured by  increased METs and functional capacity (6MWT)       Able to understand and use rate of perceived exertion (RPE) scale  Yes       Intervention  Provide education and explanation on how to use RPE scale       Expected Outcomes  Short Term: Able to use RPE daily in rehab to express subjective intensity level;Long Term:  Able to use RPE to guide intensity level when exercising independently       Able to understand and use Dyspnea scale  Yes       Intervention  Provide education and explanation on how to use Dyspnea scale       Expected Outcomes  Short Term: Able to use Dyspnea scale daily in rehab to express subjective sense of shortness of breath during exertion;Long Term: Able to use Dyspnea scale to guide intensity level when exercising independently       Knowledge and understanding of Target Heart Rate Range (THRR)  Yes       Intervention  Provide education and explanation of THRR including how the numbers were predicted and where they are located for reference       Expected Outcomes  Short Term: Able to state/look up THRR;Short Term: Able to use daily as  guideline for intensity in rehab;Long Term: Able to use THRR to govern intensity when exercising independently       Able to check pulse independently  Yes       Intervention  Provide education and demonstration on how to check pulse in carotid and radial arteries.;Review the importance of being able to check your own pulse for safety during independent exercise       Expected Outcomes  Short Term: Able to explain why pulse checking is important during independent exercise;Long Term: Able to check pulse independently and accurately       Understanding of Exercise Prescription  Yes       Intervention  Provide education, explanation, and written materials on patient's individual exercise prescription       Expected Outcomes  Short Term: Able to explain program exercise prescription;Long Term: Able to explain home exercise prescription to exercise independently          Exercise Goals Re-Evaluation : Exercise Goals Re-Evaluation    Row Name 03/08/18 0931 03/17/18 0813 03/22/18 0806 04/07/18 0955 04/22/18 1151     Exercise Goal Re-Evaluation   Exercise Goals Review  Increase Physical Activity;Able to understand and use rate of perceived exertion (RPE) scale;Knowledge and understanding of Target Heart Rate Range (THRR);Understanding of Exercise Prescription;Increase Strength and Stamina;Able to understand and use Dyspnea scale  Increase Physical Activity;Able to understand and use rate of perceived exertion (RPE) scale;Knowledge and understanding of Target Heart Rate Range (THRR);Understanding of Exercise Prescription;Increase Strength and Stamina;Able to understand and use Dyspnea scale  Increase Physical Activity;Increase Strength and Stamina;Understanding of Exercise Prescription  Increase Physical Activity;Increase Strength and Stamina;Able to understand and use rate of perceived exertion (RPE) scale;Knowledge and understanding of Target Heart Rate Range (THRR)  Increase Physical Activity;Increase  Strength and Stamina;Able to understand and use rate of perceived exertion (RPE) scale   Comments  Reviewed RPE scale, THR and program prescription with pt today.  Pt voiced understanding and was given a copy of goals to take home.   Reviewed home exercise with pt today.  Pt plans to walk at home and dumbbell exercises for exercise.  Reviewed THR, pulse, RPE, sign and symptoms, NTG use, and when to  call 911 or MD.  Also discussed weather considerations and indoor options.  Pt voiced understanding.  Abe People is doing well in rehab.  He is walking at the mall for 5 laps each day which takes about 41mn.  He is beginning to get his strength and stamina back.  We will continue to work on progression.   BAbe Peopleis progressing well with exercise and has improved overall MET level.  He has just switched to 4 pm class.  Staff will monitor progress.  BAbe Peoplehas made small improvement in MET level and has increased intensity on machines.  Staff will monitor progress.   Expected Outcomes  Short: Use RPE daily to regulate intensity. Long: Follow program prescription in THR.  Short: exercise an additional 3 days at home Long: become comfortbale exercising regularly on his own   Short: Continue to walk on his off days.  Long: Continue to build strength and stamina.   Short - continue to attend consistently Long - Increase MET level and maintain fitness on his own  Short - attend consistently Long - improve MET level and complete HT   Row Name 05/05/18 0833             Exercise Goal Re-Evaluation   Exercise Goals Review  Increase Physical Activity;Increase Strength and Stamina;Able to understand and use rate of perceived exertion (RPE) scale;Knowledge and understanding of Target Heart Rate Range (THRR);Able to check pulse independently       Comments  BAbe Peoplecontinues to improve overall MET level.  He has increased weight to 8 lb strength training.       Expected Outcomes  Short - complete HT program Long - maintain exercise  on his own           Discharge Exercise Prescription (Final Exercise Prescription Changes): Exercise Prescription Changes - 05/05/18 0800      Response to Exercise   Blood Pressure (Admit)  124/62    Blood Pressure (Exercise)  144/76    Blood Pressure (Exit)  122/60    Heart Rate (Admit)  91 bpm    Heart Rate (Exercise)  111 bpm    Heart Rate (Exit)  98 bpm    Rating of Perceived Exertion (Exercise)  14    Symptoms  none    Duration  Continue with 45 min of aerobic exercise without signs/symptoms of physical distress.    Intensity  THRR unchanged      Progression   Progression  Continue to progress workloads to maintain intensity without signs/symptoms of physical distress.    Average METs  3.9      Resistance Training   Training Prescription  Yes    Weight  8 lb    Reps  10-15      Interval Training   Interval Training  No      Treadmill   MPH  3.2    Grade  2.5    Minutes  15    METs  4.95      REL-XR   Level  5    Minutes  15    METs  3.7      T5 Nustep   Level  3    SPM  80    Minutes  15    METs  3      Home Exercise Plan   Plans to continue exercise at  Home (comment)   walks outside or at the mall, dumbbell exercises    Frequency  Add 3 additional  days to program exercise sessions.    Initial Home Exercises Provided  03/17/18       Nutrition:  Target Goals: Understanding of nutrition guidelines, daily intake of sodium <1578m, cholesterol <2075m calories 30% from fat and 7% or less from saturated fats, daily to have 5 or more servings of fruits and vegetables.  Biometrics: Pre Biometrics - 03/07/18 1531      Pre Biometrics   Height  5' 9.2" (1.758 m)    Weight  205 lb 12.8 oz (93.4 kg)    Waist Circumference  41.5 inches    Hip Circumference  38 inches    Waist to Hip Ratio  1.09 %    BMI (Calculated)  30.2    Single Leg Stand  2.57 seconds        Nutrition Therapy Plan and Nutrition Goals: Nutrition Therapy & Goals - 03/15/18 1139       Nutrition Therapy   Diet  DM    Protein (specify units)  12-13oz    Fiber  35 grams    Whole Grain Foods  3 servings   chooses whole grains semi-regularly   Saturated Fats  16 max. grams    Fruits and Vegetables  5 servings/day   8 ideal   Sodium  1500 grams      Personal Nutrition Goals   Nutrition Goal  When you return to work, try to keep the current early breakfast and mid-morning snack schedule that you've implemented, rather than waiting to eat and skipping a snack    Personal Goal #2  Start to become more aware of the sodium content of foods by reading nutrition facts labels more regularly, and choose lower sodium varieties IE less than 15031mer serving    Personal Goal #3  Continue to practice portion control when it comes to starch-heavy foods. Using the ice cream scoop for reference is a great strategy, good job!    Comments  He has started to monitor his sodium intake since hospitalization. Does not drink sugar sweetened beverages and gets a variety of fiber and protein sources throughout the day. He has recently changed his morning eating schedule which is better suited for DM management      Intervention Plan   Intervention  Prescribe, educate and counsel regarding individualized specific dietary modifications aiming towards targeted core components such as weight, hypertension, lipid management, diabetes, heart failure and other comorbidities.;Nutrition handout(s) given to patient.   Low sodium nutrition therapy, Salt food swaps handouts   Expected Outcomes  Short Term Goal: Understand basic principles of dietary content, such as calories, fat, sodium, cholesterol and nutrients.;Short Term Goal: A plan has been developed with personal nutrition goals set during dietitian appointment.;Long Term Goal: Adherence to prescribed nutrition plan.       Nutrition Assessments: Nutrition Assessments - 03/07/18 1319      MEDFICTS Scores   Pre Score  108       Nutrition Goals  Re-Evaluation: Nutrition Goals Re-Evaluation    Row Name 03/15/18 1203 03/22/18 0810           Goals   Nutrition Goal  When you return to work, try to keep the current early breakfast and mid-morning snack schedule that you've implemented, rather than waiting to eat and skipping a snack  Back to work on Sept 3.  Focus on breakfast and snacks. Reduce sodium.  Practice portion control.       Comment  He used to wait to eat breakfast until  mid morning, but now he has been eating breakfast earlier and implementing a mid-morning snack, which he hopes to continue when he returns to work  His appetite is getting better overall. He is trying to get more breakfast and healthy snacks.  He is also working on better portion control.   He is focused on reading labels to find healthier options.       Expected Outcome  He will eat breakfast and a mid-morning snack on his work break regularly to improve BG control  Short: Continue to work on eating breakfast.  Long: Continue reduce portions.         Personal Goal #2 Re-Evaluation   Personal Goal #2  Start to become more aware of the sodium content of foods by reading nutrition facts labels more regularly, and choose lower sodium varieties IE less than 142m per serving  -        Personal Goal #3 Re-Evaluation   Personal Goal #3  Continue to practice portion control when it comes to starch-heavy foods. Using the ice cream scoop for reference is a great strategy, good job!  -         Nutrition Goals Discharge (Final Nutrition Goals Re-Evaluation): Nutrition Goals Re-Evaluation - 03/22/18 0810      Goals   Nutrition Goal  Back to work on Sept 3.  Focus on breakfast and snacks. Reduce sodium.  Practice portion control.     Comment  His appetite is getting better overall. He is trying to get more breakfast and healthy snacks.  He is also working on better portion control.   He is focused on reading labels to find healthier options.     Expected Outcome  Short:  Continue to work on eating breakfast.  Long: Continue reduce portions.        Psychosocial: Target Goals: Acknowledge presence or absence of significant depression and/or stress, maximize coping skills, provide positive support system. Participant is able to verbalize types and ability to use techniques and skills needed for reducing stress and depression.   Initial Review & Psychosocial Screening: Initial Psych Review & Screening - 03/07/18 1320      Initial Review   Current issues with  None Identified      Family Dynamics   Good Support System?  Yes   Spouse, daughter     Barriers   Psychosocial barriers to participate in program  The patient should benefit from training in stress management and relaxation.;There are no identifiable barriers or psychosocial needs.      Screening Interventions   Interventions  Encouraged to exercise;Provide feedback about the scores to participant;To provide support and resources with identified psychosocial needs    Expected Outcomes  Short Term goal: Utilizing psychosocial counselor, staff and physician to assist with identification of specific Stressors or current issues interfering with healing process. Setting desired goal for each stressor or current issue identified.;Long Term Goal: Stressors or current issues are controlled or eliminated.;Short Term goal: Identification and review with participant of any Quality of Life or Depression concerns found by scoring the questionnaire.;Long Term goal: The participant improves quality of Life and PHQ9 Scores as seen by post scores and/or verbalization of changes       Quality of Life Scores:  Quality of Life - 03/07/18 1322      Quality of Life   Select  Quality of Life      Quality of Life Scores   Health/Function Pre  15.47 %  Socioeconomic Pre  24 %    Psych/Spiritual Pre  22.5 %    Family Pre  24 %    GLOBAL Pre  19.93 %      Scores of 19 and below usually indicate a poorer quality of  life in these areas.  A difference of  2-3 points is a clinically meaningful difference.  A difference of 2-3 points in the total score of the Quality of Life Index has been associated with significant improvement in overall quality of life, self-image, physical symptoms, and general health in studies assessing change in quality of life.  PHQ-9: Recent Review Flowsheet Data    Depression screen [facility] 2/9 04/12/2018 03/31/2018 03/07/2018   Decreased Interest 0 0 0   Down, Depressed, Hopeless 0 0 0   PHQ - 2 Score 0 0 0   Altered sleeping '1 2 2   ' Tired, decreased energy 0 0 2   Change in appetite 0 0 2   Feeling bad or failure about yourself  0 0 0   Trouble concentrating 0 0 0   Moving slowly or fidgety/restless 0 0 0   Suicidal thoughts 0 0 0   PHQ-9 Score '1 2 6   ' Difficult doing work/chores Not difficult at all Somewhat difficult Somewhat difficult     Interpretation of Total Score  Total Score Depression Severity:  1-4 = Minimal depression, 5-9 = Mild depression, 10-14 = Moderate depression, 15-19 = Moderately severe depression, 20-27 = Severe depression   Psychosocial Evaluation and Intervention: Psychosocial Evaluation - 03/17/18 0956      Psychosocial Evaluation & Interventions   Interventions  Relaxation education;Stress management education;Encouraged to exercise with the program and follow exercise prescription    Comments  Counselor met with Mr. [facility]) today for initial psychosocial evaluation.  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  Abe People has a strong support system with a spouse of 70 years and (3) adult children as well as a brother locally and a 56 year old grandson who lives in the home.  Abe People reports having difficulty sleeping since the surgery and is hoping exercise will help with that.  His appetite has improved lately.  Abe People denies a history of depression or anxiety or any current symptoms and states he is typically  in a positive mood most of the time.  Billy's stress currently are finances; his health; and his 39 year old grandson lives there and is having some behavioral problems.  He has goals to get back to work soon due to finances; to increase his strength; eat healthier and manage his stress better.  Counselor recommended speaking to his Dr/Pharmacist about an OTC sleep aid to improve his sleep quality and quantity.  Staff will follow with [facility].      Expected Outcomes  Short:  Abe People will talk with his Dr/Pharmacist about a natural sleep aid to help with his recent intermittent sleep issues.  He will exercise more consistently to help with stress and sleep concerns.  Long:  Abe People will develop a habit of exercise; healthy diet and positive self-care to manage his stress.    Continue Psychosocial Services   Follow up required by staff       Psychosocial Re-Evaluation: Psychosocial Re-Evaluation    [place] Name 03/22/18 0810 04/20/18 1657           Psychosocial Re-Evaluation   Current issues with  Current Sleep Concerns;Current Stress  Concerns  -      Comments  Abe People has gotten back to sleeping well.  He is feeling much better. He continues to remain postive overall.  He is enjoying the exercise and losing weight.    Counselor follow up with [facility] reporting feeling stronger and more energetic since coming into this program.  He is sleeping better most of the time and notices exercise helps with this as well.  Counselor commended [place] on progress made since coming into this program.        Expected Outcomes  Short: Continue to attend classes  Long: Continue to remain positive.   Short:  Abe People will continue to exercise for his health and mental health and to promote a better quality of sleep.  Long:  Abe People will routinely adhere to positive coping strategies including exercise.      Interventions  Encouraged to attend Cardiac Rehabilitation for the exercise  -      Continue Psychosocial Services   Follow up  required by staff  Follow up required by staff         Psychosocial Discharge (Final Psychosocial Re-Evaluation): Psychosocial Re-Evaluation - 04/20/18 1657      Psychosocial Re-Evaluation   Comments  Counselor follow up with Abe People reporting feeling stronger and more energetic since coming into this program.  He is sleeping better most of the time and notices exercise helps with this as well.  Counselor commended [place] on progress made since coming into this program.      Expected Outcomes  Short:  Abe People will continue to exercise for his health and mental health and to promote a better quality of sleep.  Long:  Abe People will routinely adhere to positive coping strategies including exercise.    Continue Psychosocial Services   Follow up required by staff       Vocational Rehabilitation: Provide vocational rehab assistance to qualifying candidates.   Vocational Rehab Evaluation & Intervention: Vocational Rehab - 03/07/18 1327      Initial Vocational Rehab Evaluation & Intervention   Assessment shows need for Vocational Rehabilitation  No       Education: Education Goals: Education classes will be provided on a variety of topics geared toward better understanding of heart health and risk factor modification. Participant will state understanding/return demonstration of topics presented as noted by education test scores.  Learning Barriers/Preferences: Learning Barriers/Preferences - 03/07/18 1324      Learning Barriers/Preferences   Learning Barriers  None    Learning Preferences  None       Education Topics:  AED/CPR: - Group verbal and written instruction with the use of models to demonstrate the basic use of the AED with the basic ABC's of resuscitation.   General Nutrition Guidelines/Fats and Fiber: -Group instruction provided by verbal, written material, models and posters to present the general guidelines for heart healthy nutrition. Gives an explanation and review of  dietary fats and fiber.   Cardiac Rehab from 04/27/2018 in [facility] Cardiac and Pulmonary Rehab  Date  04/11/18  Educator  LB  Instruction Review Code  1- Verbalizes Understanding      Controlling Sodium/Reading Food Labels: -Group verbal and written material supporting the discussion of sodium use in heart healthy nutrition. Review and explanation with models, verbal and written materials for utilization of the food label.   Cardiac Rehab from 04/27/2018 in [facility] Cardiac and Pulmonary Rehab  Date  04/20/18  Educator  LB  Instruction Review Code  1- Verbalizes Understanding  Exercise Physiology & General Exercise Guidelines: - Group verbal and written instruction with models to review the exercise physiology of the cardiovascular system and associated critical values. Provides general exercise guidelines with specific guidelines to those with heart or lung disease.    Cardiac Rehab from 04/27/2018 in [facility] Cardiac and Pulmonary Rehab  Date  04/27/18  Educator  Nada Maclachlan, EP  Instruction Review Code  1- Verbalizes Understanding      Aerobic Exercise & Resistance Training: - Gives group verbal and written instruction on the various components of exercise. Focuses on aerobic and resistive training programs and the benefits of this training and how to safely progress through these programs..   Cardiac Rehab from 04/27/2018 in [facility] Cardiac and Pulmonary Rehab  Date  03/10/18  Educator  AS  Instruction Review Code  1- Verbalizes Understanding      Flexibility, Balance, Mind/Body Relaxation: Provides group verbal/written instruction on the benefits of flexibility and balance training, including mind/body exercise modes such as yoga, pilates and tai chi.  Demonstration and skill practice provided.   Cardiac Rehab from 04/27/2018 in [facility] Cardiac and Pulmonary Rehab  Date  03/15/18  Educator  AS  Instruction Review Code  1- Verbalizes Understanding      Stress and Anxiety: - Provides  group verbal and written instruction about the health risks of elevated stress and causes of high stress.  Discuss the correlation between heart/lung disease and anxiety and treatment options. Review healthy ways to manage with stress and anxiety.   Depression: - Provides group verbal and written instruction on the correlation between heart/lung disease and depressed mood, treatment options, and the stigmas associated with seeking treatment.   Cardiac Rehab from 04/27/2018 in [facility] Cardiac and Pulmonary Rehab  Date  03/08/18  Educator  [facility]  Instruction Review Code  1- Verbalizes Understanding      Anatomy & Physiology of the Heart: - Group verbal and written instruction and models provide basic cardiac anatomy and physiology, with the coronary electrical and arterial systems. Review of Valvular disease and Heart Failure   Cardiac Procedures: - Group verbal and written instruction to review commonly prescribed medications for heart disease. Reviews the medication, class of the drug, and side effects. Includes the steps to properly store meds and maintain the prescription regimen. (beta blockers and nitrates)   Cardiac Rehab from 04/27/2018 in [facility] Cardiac and Pulmonary Rehab  Date  04/13/18  Educator  [facility]  Instruction Review Code  1- Verbalizes Understanding      Cardiac Medications I: - Group verbal and written instruction to review commonly prescribed medications for heart disease. Reviews the medication, class of the drug, and side effects. Includes the steps to properly store meds and maintain the prescription regimen.   Cardiac Rehab from 04/27/2018 in [facility] Cardiac and Pulmonary Rehab  Date  03/29/18  Educator  SB  Instruction Review Code  1- Verbalizes Understanding      Cardiac Medications II: -Group verbal and written instruction to review commonly prescribed medications for heart disease. Reviews the medication, class of the drug, and side effects. (all other drug classes)    Cardiac Rehab from 04/27/2018 in [facility] Cardiac and Pulmonary Rehab  Date  03/17/18  Educator  CE  Instruction Review Code  1- Verbalizes Understanding       Go Sex-Intimacy & Heart Disease, Get SMART - Goal Setting: - Group verbal and written instruction through game format to discuss heart disease and the return to sexual intimacy. Provides group verbal  and written material to discuss and apply goal setting through the application of the S.M.A.R.T. Method.   Cardiac Rehab from 04/27/2018 in [facility] Cardiac and Pulmonary Rehab  Date  04/13/18  Educator  [facility]  Instruction Review Code  1- Verbalizes Understanding      Other Matters of the Heart: - Provides group verbal, written materials and models to describe Stable Angina and Peripheral Artery. Includes description of the disease process and treatment options available to the cardiac patient.   Exercise & Equipment Safety: - Individual verbal instruction and demonstration of equipment use and safety with use of the equipment.   Cardiac Rehab from 04/27/2018 in [facility] Cardiac and Pulmonary Rehab  Date  03/07/18  Educator  Sb  Instruction Review Code  1- Verbalizes Understanding      Infection Prevention: - Provides verbal and written material to individual with discussion of infection control including proper hand washing and proper equipment cleaning during exercise session.   Cardiac Rehab from 04/27/2018 in [facility] Cardiac and Pulmonary Rehab  Date  03/07/18  Educator  SB  Instruction Review Code  1- Verbalizes Understanding      Falls Prevention: - Provides verbal and written material to individual with discussion of falls prevention and safety.   Cardiac Rehab from 04/27/2018 in [facility] Cardiac and Pulmonary Rehab  Date  03/07/18  Educator  SB  Instruction Review Code  1- Verbalizes Understanding      Diabetes: - Individual verbal and written instruction to review signs/symptoms of diabetes, desired ranges of glucose level fasting,  after meals and with exercise. Acknowledge that pre and post exercise glucose checks will be done for 3 sessions at entry of program.   Know Your Numbers and Risk Factors: -Group verbal and written instruction about important numbers in your health.  Discussion of what are risk factors and how they play a role in the disease process.  Review of Cholesterol, Blood Pressure, Diabetes, and BMI and the role they play in your overall health.   Cardiac Rehab from 04/27/2018 in [facility] Cardiac and Pulmonary Rehab  Date  03/17/18  Educator  CE  Instruction Review Code  1- Verbalizes Understanding      Sleep Hygiene: -Provides group verbal and written instruction about how sleep can affect your health.  Define sleep hygiene, discuss sleep cycles and impact of sleep habits. Review good sleep hygiene tips.    Cardiac Rehab from 04/27/2018 in [facility] Cardiac and Pulmonary Rehab  Date  04/06/18  Educator  [facility]  Instruction Review Code  1- Verbalizes Understanding      Other: -Provides group and verbal instruction on various topics (see comments)   Knowledge Questionnaire Score: Knowledge Questionnaire Score - 03/07/18 1325      Knowledge Questionnaire Score   Pre Score  21/26   Reviewed corret responses with Billy today. He verbalized understanding of the responses. Areas missed Angina, PAD ,Exercise and Nutrition      Core Components/Risk Factors/Patient Goals at Admission: Personal Goals and Risk Factors at Admission - 03/07/18 1319      Core Components/Risk Factors/Patient Goals on Admission    Weight Management  Yes;Weight Gain    Intervention  Weight Management: Develop a combined nutrition and exercise program designed to reach desired caloric intake, while maintaining appropriate intake of nutrient and fiber, sodium and fats, and appropriate energy expenditure required for the weight goal.    Admit Weight  205 lb 12.8 oz (93.4 kg)    Goal Weight: Short Term  210  lb (95.3 kg)    Goal Weight:  Long Term  220 lb (99.8 kg)    Expected Outcomes  Short Term: Continue to assess and modify interventions until short term weight is achieved;Long Term: Adherence to nutrition and physical activity/exercise program aimed toward attainment of established weight goal;Weight Gain: Understanding of general recommendations for a high calorie, high protein meal plan that promotes weight gain by distributing calorie intake throughout the day with the consumption for 4-5 meals, snacks, and/or supplements    Diabetes  Yes    Intervention  Provide education about signs/symptoms and action to take for hypo/hyperglycemia.;Provide education about proper nutrition, including hydration, and aerobic/resistive exercise prescription along with prescribed medications to achieve blood glucose in normal ranges: Fasting glucose 65-99 mg/dL    Expected Outcomes  Short Term: Participant verbalizes understanding of the signs/symptoms and immediate care of hyper/hypoglycemia, proper foot care and importance of medication, aerobic/resistive exercise and nutrition plan for blood glucose control.;Long Term: Attainment of HbA1C < 7%.    Hypertension  Yes    Intervention  Provide education on lifestyle modifcations including regular physical activity/exercise, weight management, moderate sodium restriction and increased consumption of fresh fruit, vegetables, and low fat dairy, alcohol moderation, and smoking cessation.;Monitor prescription use compliance.    Expected Outcomes  Short Term: Continued assessment and intervention until BP is < 140/11m HG in hypertensive participants. < 130/843mHG in hypertensive participants with diabetes, heart failure or chronic kidney disease.;Long Term: Maintenance of blood pressure at goal levels.    Lipids  Yes    Intervention  Provide education and support for participant on nutrition & aerobic/resistive exercise along with prescribed medications to achieve LDL <7071mHDL >77m91m  Expected  Outcomes  Short Term: Participant states understanding of desired cholesterol values and is compliant with medications prescribed. Participant is following exercise prescription and nutrition guidelines.;Long Term: Cholesterol controlled with medications as prescribed, with individualized exercise RX and with personalized nutrition plan. Value goals: LDL < 70mg56mL > 40 mg.       Core Components/Risk Factors/Patient Goals Review:  Goals and Risk Factor Review    Row Name 03/22/18 0807 04/22/18 0830           Core Components/Risk Factors/Patient Goals Review   Personal Goals Review  Weight Management/Obesity;Hypertension;Lipids  Weight Management/Obesity;Hypertension;Lipids;Other      Review  BillyAbe Peoplea run of SVT over weekend for about 20min44m dropped his blood pressure.  He has a call out to the cardiologist. He is losing a little a weight.  Today he is 201 but has been as low as 198lbs.  Overall his blood pressures are doing well and he checks them at home frequently.  He is doing well on his medications.   Billy Abe Peoplerkin gtowards goal weigh tof 190 lb.  He is at 199 today. He has been cutting out fatty and salty foods and not eating as much.  He tried a new med for SVT and it was causing too much fatigue.  He is back on metoprolol and reports short runs of SVT lasting < 2min. 33m is walking on days not as HT for exercise.      Expected Outcomes  Short: Make sure he hears back from cardiologist.  Long: Continue to work on weight loss.   Short - continue to eat heart healthy diet and exercise consistently  Long - reach goal weight and maintain heart healthy diet and exercise  Core Components/Risk Factors/Patient Goals at Discharge (Final Review):  Goals and Risk Factor Review - 04/22/18 0830      Core Components/Risk Factors/Patient Goals Review   Personal Goals Review  Weight Management/Obesity;Hypertension;Lipids;Other    Review  Abe People is workin gtowards goal weigh tof 190 lb.   He is at 199 today. He has been cutting out fatty and salty foods and not eating as much.  He tried a new med for SVT and it was causing too much fatigue.  He is back on metoprolol and reports short runs of SVT lasting < 32mn.  He is walking on days not as HT for exercise.    Expected Outcomes  Short - continue to eat heart healthy diet and exercise consistently  Long - reach goal weight and maintain heart healthy diet and exercise        ITP Comments: ITP Comments    Row Name 03/16/18 0812 03/22/18 1150 04/13/18 0556 05/11/18 0742     ITP Comments  30 day review completed. ITP sent to Dr. BRamonita Lab covering for Dr. MEmily Filbert Medical Director of Cardiac Rehab. Continue with ITP unless changes are made by physician.  New to program  During education, BAbe Peoplenoted that he was having palpitations again like he did over the weekend.  We hooked him back up on the monitor and he was in SVT.  He had called cardiology yesterday with no return call.  RN called office today during education. BAbe Peoplewas sent up to his cardiologist after class.   30 day review completed. ITP sent to Dr. MEmily Filbert Medical Director of Cardiac Rehab. Continue with ITP unless changes are made by physician  30 day review completed. ITP sent to Dr. MEmily Filbert Medical Director of Cardiac Rehab. Continue with ITP unless changes are made by physician       Comments:

## 2018-05-13 ENCOUNTER — Telehealth: Payer: Self-pay

## 2018-05-13 NOTE — Telephone Encounter (Signed)
LMOM

## 2018-05-19 ENCOUNTER — Telehealth: Payer: Self-pay

## 2018-05-19 DIAGNOSIS — E113513 Type 2 diabetes mellitus with proliferative diabetic retinopathy with macular edema, bilateral: Secondary | ICD-10-CM | POA: Diagnosis not present

## 2018-05-19 DIAGNOSIS — H3582 Retinal ischemia: Secondary | ICD-10-CM | POA: Diagnosis not present

## 2018-05-19 DIAGNOSIS — H35372 Puckering of macula, left eye: Secondary | ICD-10-CM | POA: Diagnosis not present

## 2018-05-19 NOTE — Telephone Encounter (Signed)
LMOM

## 2018-05-26 NOTE — Progress Notes (Signed)
Cardiac Individual Treatment Plan  Patient Details  Name: Ricardo Brown MRN: [id number] Date of Birth: Jun 01, 1961 Referring Provider:     Cardiac Rehab from 03/07/2018 in [facility] Cardiac and Pulmonary Rehab  Referring Provider  Isaias Cowman MD      Initial Encounter Date:    Cardiac Rehab from 03/07/2018 in [facility] Cardiac and Pulmonary Rehab  Date  03/07/18      Visit Diagnosis: No diagnosis found.  Patient's Home Medications on Admission:  Current Outpatient Medications:  .  aspirin 81 MG chewable tablet, Chew by mouth., Disp: , Rfl:  .  Fenofibrate 120 MG TABS, Take by mouth., Disp: , Rfl:  .  metFORMIN (GLUCOPHAGE) 1000 MG tablet, Take by mouth., Disp: , Rfl:  .  metoprolol succinate (TOPROL-XL) 25 MG 24 hr tablet, TAKE 1 2 (ONE HALF) TABLET BY MOUTH ONCE DAILY, Disp: , Rfl: 2 .  Omega-3 Fatty Acids (FISH OIL) 875 MG CAPS, Take 1 capsule by mouth 2 (two) times daily., Disp: , Rfl:  .  pseudoephedrine (SUDAFED) 30 MG tablet, Take 30 mg by mouth every 4 (four) hours as needed for congestion., Disp: , Rfl:  .  rosuvastatin (CRESTOR) 5 MG tablet, Take by mouth., Disp: , Rfl:  .  TRESIBA FLEXTOUCH 100 UNIT/ML SOPN FlexTouch Pen, , Disp: , Rfl: 2  Past Medical History: Past Medical History:  Diagnosis Date  . Diabetes mellitus without complication ([place])    a. Dx ~ 2000.  [name] Heart palpitations    a. Pt reports prior nl echo's and stress tests. Last stress test w/in past 2 yrs - PCP.  [name] High cholesterol     Tobacco Use: Social History   Tobacco Use  Smoking Status Never Smoker  Smokeless Tobacco Never Used    Labs: Recent Review Scientist, physiological    Labs for ITP Cardiac and Pulmonary Rehab Latest Ref Rng & Units 01/24/2018   Cholestrol 0 - 200 mg/dL 163   LDLCALC 0 - 99 mg/dL 116(H)   HDL >40 mg/dL 23(L)   Trlycerides <150 mg/dL 120       Exercise Target Goals: Exercise Program Goal: Individual exercise prescription set using results from initial 6 min  walk test and THRR while considering  patient's activity barriers and safety.   Exercise Prescription Goal: Initial exercise prescription builds to 30-45 minutes a day of aerobic activity, 2-3 days per week.  Home exercise guidelines will be given to patient during program as part of exercise prescription that the participant will acknowledge.  Activity Barriers & Risk Stratification: Activity Barriers & Cardiac Risk Stratification - 03/07/18 1318      Activity Barriers & Cardiac Risk Stratification   Activity Barriers  Joint Problems;Deconditioning;Muscular Weakness;Balance Concerns   Hips hurt when bends over, occasional shoulder pain when reaching overhead   Cardiac Risk Stratification  High       6 Minute Walk: 6 Minute Walk    Row Name 03/07/18 1528         6 Minute Walk   Phase  Initial     Distance  1294 feet     Walk Time  6 minutes     # of Rest Breaks  0     MPH  2.45     METS  3.7     RPE  10     VO2 Peak  12.95     Symptoms  No     Resting HR  88 bpm     Resting BP  136/66     Resting Oxygen Saturation   99 %     Exercise Oxygen Saturation  during 6 min walk  99 %     Max Ex. HR  102 bpm     Max Ex. BP  152/74     2 Minute Post BP  122/66        Oxygen Initial Assessment:   Oxygen Re-Evaluation:   Oxygen Discharge (Final Oxygen Re-Evaluation):   Initial Exercise Prescription: Initial Exercise Prescription - 03/07/18 1500      Date of Initial Exercise RX and Referring Provider   Date  03/07/18    Referring Provider  Paraschos, Alexander MD      Treadmill   MPH  2.5    Grade  1    Minutes  15    METs  3.26      REL-XR   Level  3    Speed  50    Minutes  15    METs  3      T5 Nustep   Level  3    SPM  80    Minutes  15    METs  3      Prescription Details   Frequency (times per week)  2    Duration  Progress to 30 minutes of continuous aerobic without signs/symptoms of physical distress      Intensity   THRR 40-80% of Max  Heartrate  118-149    Ratings of Perceived Exertion  11-13    Perceived Dyspnea  0-4      Progression   Progression  Continue to progress workloads to maintain intensity without signs/symptoms of physical distress.      Resistance Training   Training Prescription  Yes    Weight  4 lbs    Reps  10-15       Perform Capillary Blood Glucose checks as needed.  Exercise Prescription Changes: Exercise Prescription Changes    Row Name 03/07/18 1300 03/08/18 1500 03/17/18 0800 03/22/18 1500 04/07/18 0900     Response to Exercise   Blood Pressure (Admit)  136/66  134/72  -  120/72  132/76   Blood Pressure (Exercise)  152/74  148/70  -  156/70  132/74   Blood Pressure (Exit)  122/66  122/64  -  124/72  118/68   Heart Rate (Admit)  88 bpm  95 bpm  -  91 bpm  88 bpm   Heart Rate (Exercise)  101 bpm  115 bpm  -  111 bpm  108 bpm   Heart Rate (Exit)  91 bpm  98 bpm  -  96 bpm  98 bpm   Oxygen Saturation (Admit)  99 %  -  -  -  -   Oxygen Saturation (Exercise)  99 %  -  -  -  -   Rating of Perceived Exertion (Exercise)  10  14  -  13  13   Symptoms  none  SOB  -  none  none   Comments  walk test results  first full day of exercise  -  -  -   Duration  -  Continue with 30 min of aerobic exercise without signs/symptoms of physical distress.  -  Continue with 30 min of aerobic exercise without signs/symptoms of physical distress.  Continue with 45 min of aerobic exercise without signs/symptoms of physical distress.   Intensity  -  THRR unchanged  -  THRR unchanged  THRR unchanged     Progression   Progression  -  Continue to progress workloads to maintain intensity without signs/symptoms of physical distress.  -  Continue to progress workloads to maintain intensity without signs/symptoms of physical distress.  Continue to progress workloads to maintain intensity without signs/symptoms of physical distress.   Average METs  -  2.83  -  2.94  3.5     Resistance Training   Training Prescription  -   Yes  -  Yes  Yes   Weight  -  4 lbs  -  5 lbs  5 lb   Reps  -  10-15  -  10-15  10-15     Interval Training   Interval Training  -  No  -  No  No     Treadmill   MPH  -  2.5  -  3  3   Grade  -  1  -  1  1.5   Minutes  -  15  -  15  15   METs  -  3.26  -  3.71  3.92     REL-XR   Level  -  -  -  5  5   Minutes  -  -  -  15  15   METs  -  -  -  2.4  3.8     T5 Nustep   Level  -  3  -  3  -   Minutes  -  15  -  15  -   METs  -  2.4  -  2.7  -     Home Exercise Plan   Plans to continue exercise at  -  -  Home (comment) walks outside or at the mall, dumbbell exercises   Home (comment) walks outside or at the mall, dumbbell exercises   Home (comment) walks outside or at the mall, dumbbell exercises    Frequency  -  -  Add 3 additional days to program exercise sessions.  Add 3 additional days to program exercise sessions.  Add 3 additional days to program exercise sessions.   Initial Home Exercises Provided  -  -  03/17/18  03/17/18  03/17/18   Row Name 04/22/18 1100 05/05/18 0800           Response to Exercise   Blood Pressure (Admit)  108/54  124/62      Blood Pressure (Exercise)  142/68  144/76      Blood Pressure (Exit)  120/60  122/60      Heart Rate (Admit)  84 bpm  91 bpm      Heart Rate (Exercise)  103 bpm  111 bpm      Heart Rate (Exit)  94 bpm  98 bpm      Rating of Perceived Exertion (Exercise)  14  14      Symptoms  none  none      Duration  Continue with 45 min of aerobic exercise without signs/symptoms of physical distress.  Continue with 45 min of aerobic exercise without signs/symptoms of physical distress.      Intensity  THRR unchanged  THRR unchanged        Progression   Progression  Continue to progress workloads to maintain intensity without signs/symptoms of physical distress.  Continue to progress workloads to maintain intensity without signs/symptoms of physical distress.      Average METs  3.7  3.9  Resistance Training   Training Prescription   Yes  Yes      Weight  5 lb  8 lb      Reps  10-15  10-15        Interval Training   Interval Training  No  No        Treadmill   MPH  3  3.2      Grade  4  2.5      Minutes  15  15      METs  4.95  4.95        REL-XR   Level  5  5      Minutes  15  15      METs  3.4  3.7        T5 Nustep   Level  3  3      SPM  80  80      Minutes  15  15      METs  2.8  3        Home Exercise Plan   Plans to continue exercise at  Home (comment) walks outside or at the mall, dumbbell exercises   Home (comment) walks outside or at the mall, dumbbell exercises       Frequency  Add 3 additional days to program exercise sessions.  Add 3 additional days to program exercise sessions.      Initial Home Exercises Provided  03/17/18  03/17/18         Exercise Comments: Exercise Comments    Row Name 03/08/18 [phone]           Exercise Comments  First full day of exercise!  Patient was oriented to gym and equipment including functions, settings, policies, and procedures.  Patient's individual exercise prescription and treatment plan were reviewed.  All starting workloads were established based on the results of the 6 minute walk test done at initial orientation visit.  The plan for exercise progression was also introduced and progression will be customized based on patient's performance and goals.          Exercise Goals and Review: Exercise Goals    Row Name 03/07/18 1531             Exercise Goals   Increase Physical Activity  Yes       Intervention  Provide advice, education, support and counseling about physical activity/exercise needs.;Develop an individualized exercise prescription for aerobic and resistive training based on initial evaluation findings, risk stratification, comorbidities and participant's personal goals.       Expected Outcomes  Short Term: Attend rehab on a regular basis to increase amount of physical activity.;Long Term: Add in home exercise to make exercise part of  routine and to increase amount of physical activity.;Long Term: Exercising regularly at least 3-5 days a week.       Increase Strength and Stamina  Yes       Intervention  Provide advice, education, support and counseling about physical activity/exercise needs.;Develop an individualized exercise prescription for aerobic and resistive training based on initial evaluation findings, risk stratification, comorbidities and participant's personal goals.       Expected Outcomes  Short Term: Increase workloads from initial exercise prescription for resistance, speed, and METs.;Short Term: Perform resistance training exercises routinely during rehab and add in resistance training at home;Long Term: Improve cardiorespiratory fitness, muscular endurance and strength as measured by increased METs and functional capacity (6MWT)  Able to understand and use rate of perceived exertion (RPE) scale  Yes       Intervention  Provide education and explanation on how to use RPE scale       Expected Outcomes  Short Term: Able to use RPE daily in rehab to express subjective intensity level;Long Term:  Able to use RPE to guide intensity level when exercising independently       Able to understand and use Dyspnea scale  Yes       Intervention  Provide education and explanation on how to use Dyspnea scale       Expected Outcomes  Short Term: Able to use Dyspnea scale daily in rehab to express subjective sense of shortness of breath during exertion;Long Term: Able to use Dyspnea scale to guide intensity level when exercising independently       Knowledge and understanding of Target Heart Rate Range (THRR)  Yes       Intervention  Provide education and explanation of THRR including how the numbers were predicted and where they are located for reference       Expected Outcomes  Short Term: Able to state/look up THRR;Short Term: Able to use daily as guideline for intensity in rehab;Long Term: Able to use THRR to govern intensity  when exercising independently       Able to check pulse independently  Yes       Intervention  Provide education and demonstration on how to check pulse in carotid and radial arteries.;Review the importance of being able to check your own pulse for safety during independent exercise       Expected Outcomes  Short Term: Able to explain why pulse checking is important during independent exercise;Long Term: Able to check pulse independently and accurately       Understanding of Exercise Prescription  Yes       Intervention  Provide education, explanation, and written materials on patient's individual exercise prescription       Expected Outcomes  Short Term: Able to explain program exercise prescription;Long Term: Able to explain home exercise prescription to exercise independently          Exercise Goals Re-Evaluation : Exercise Goals Re-Evaluation    Row Name 03/08/18 0931 03/17/18 0813 03/22/18 0806 04/07/18 0955 04/22/18 1151     Exercise Goal Re-Evaluation   Exercise Goals Review  Increase Physical Activity;Able to understand and use rate of perceived exertion (RPE) scale;Knowledge and understanding of Target Heart Rate Range (THRR);Understanding of Exercise Prescription;Increase Strength and Stamina;Able to understand and use Dyspnea scale  Increase Physical Activity;Able to understand and use rate of perceived exertion (RPE) scale;Knowledge and understanding of Target Heart Rate Range (THRR);Understanding of Exercise Prescription;Increase Strength and Stamina;Able to understand and use Dyspnea scale  Increase Physical Activity;Increase Strength and Stamina;Understanding of Exercise Prescription  Increase Physical Activity;Increase Strength and Stamina;Able to understand and use rate of perceived exertion (RPE) scale;Knowledge and understanding of Target Heart Rate Range (THRR)  Increase Physical Activity;Increase Strength and Stamina;Able to understand and use rate of perceived exertion (RPE)  scale   Comments  Reviewed RPE scale, THR and program prescription with pt today.  Pt voiced understanding and was given a copy of goals to take home.   Reviewed home exercise with pt today.  Pt plans to walk at home and dumbbell exercises for exercise.  Reviewed THR, pulse, RPE, sign and symptoms, NTG use, and when to call 911 or MD.  Also discussed weather considerations and indoor options.  Pt voiced understanding.  Abe People is doing well in rehab.  He is walking at the mall for 5 laps each day which takes about 64mn.  He is beginning to get his strength and stamina back.  We will continue to work on progression.   BAbe Peopleis progressing well with exercise and has improved overall MET level.  He has just switched to 4 pm class.  Staff will monitor progress.  BAbe Peoplehas made small improvement in MET level and has increased intensity on machines.  Staff will monitor progress.   Expected Outcomes  Short: Use RPE daily to regulate intensity. Long: Follow program prescription in THR.  Short: exercise an additional 3 days at home Long: become comfortbale exercising regularly on his own   Short: Continue to walk on his off days.  Long: Continue to build strength and stamina.   Short - continue to attend consistently Long - Increase MET level and maintain fitness on his own  Short - attend consistently Long - improve MET level and complete HT   Row Name 05/05/18 0833             Exercise Goal Re-Evaluation   Exercise Goals Review  Increase Physical Activity;Increase Strength and Stamina;Able to understand and use rate of perceived exertion (RPE) scale;Knowledge and understanding of Target Heart Rate Range (THRR);Able to check pulse independently       Comments  BAbe Peoplecontinues to improve overall MET level.  He has increased weight to 8 lb strength training.       Expected Outcomes  Short - complete HT program Long - maintain exercise on his own           Discharge Exercise Prescription (Final Exercise  Prescription Changes): Exercise Prescription Changes - 05/05/18 0800      Response to Exercise   Blood Pressure (Admit)  124/62    Blood Pressure (Exercise)  144/76    Blood Pressure (Exit)  122/60    Heart Rate (Admit)  91 bpm    Heart Rate (Exercise)  111 bpm    Heart Rate (Exit)  98 bpm    Rating of Perceived Exertion (Exercise)  14    Symptoms  none    Duration  Continue with 45 min of aerobic exercise without signs/symptoms of physical distress.    Intensity  THRR unchanged      Progression   Progression  Continue to progress workloads to maintain intensity without signs/symptoms of physical distress.    Average METs  3.9      Resistance Training   Training Prescription  Yes    Weight  8 lb    Reps  10-15      Interval Training   Interval Training  No      Treadmill   MPH  3.2    Grade  2.5    Minutes  15    METs  4.95      REL-XR   Level  5    Minutes  15    METs  3.7      T5 Nustep   Level  3    SPM  80    Minutes  15    METs  3      Home Exercise Plan   Plans to continue exercise at  Home (comment)   walks outside or at the mall, dumbbell exercises    Frequency  Add 3 additional days to program exercise sessions.    Initial Home Exercises Provided  03/17/18       Nutrition:  Target Goals: Understanding of nutrition guidelines, daily intake of sodium <1543m, cholesterol <20100m calories 30% from fat and 7% or less from saturated fats, daily to have 5 or more servings of fruits and vegetables.  Biometrics: Pre Biometrics - 03/07/18 1531      Pre Biometrics   Height  5' 9.2" (1.758 m)    Weight  205 lb 12.8 oz (93.4 kg)    Waist Circumference  41.5 inches    Hip Circumference  38 inches    Waist to Hip Ratio  1.09 %    BMI (Calculated)  30.2    Single Leg Stand  2.57 seconds        Nutrition Therapy Plan and Nutrition Goals: Nutrition Therapy & Goals - 03/15/18 1139      Nutrition Therapy   Diet  DM    Protein (specify units)  12-13oz     Fiber  35 grams    Whole Grain Foods  3 servings   chooses whole grains semi-regularly   Saturated Fats  16 max. grams    Fruits and Vegetables  5 servings/day   8 ideal   Sodium  1500 grams      Personal Nutrition Goals   Nutrition Goal  When you return to work, try to keep the current early breakfast and mid-morning snack schedule that you've implemented, rather than waiting to eat and skipping a snack    Personal Goal #2  Start to become more aware of the sodium content of foods by reading nutrition facts labels more regularly, and choose lower sodium varieties IE less than 15082mer serving    Personal Goal #3  Continue to practice portion control when it comes to starch-heavy foods. Using the ice cream scoop for reference is a great strategy, good job!    Comments  He has started to monitor his sodium intake since hospitalization. Does not drink sugar sweetened beverages and gets a variety of fiber and protein sources throughout the day. He has recently changed his morning eating schedule which is better suited for DM management      Intervention Plan   Intervention  Prescribe, educate and counsel regarding individualized specific dietary modifications aiming towards targeted core components such as weight, hypertension, lipid management, diabetes, heart failure and other comorbidities.;Nutrition handout(s) given to patient.   Low sodium nutrition therapy, Salt food swaps handouts   Expected Outcomes  Short Term Goal: Understand basic principles of dietary content, such as calories, fat, sodium, cholesterol and nutrients.;Short Term Goal: A plan has been developed with personal nutrition goals set during dietitian appointment.;Long Term Goal: Adherence to prescribed nutrition plan.       Nutrition Assessments: Nutrition Assessments - 03/07/18 1319      MEDFICTS Scores   Pre Score  108       Nutrition Goals Re-Evaluation: Nutrition Goals Re-Evaluation    Row Name 03/15/18 1203  03/22/18 0810           Goals   Nutrition Goal  When you return to work, try to keep the current early breakfast and mid-morning snack schedule that you've implemented, rather than waiting to eat and skipping a snack  Back to work on Sept 3.  Focus on breakfast and snacks. Reduce sodium.  Practice portion control.       Comment  He used to wait to eat breakfast until mid morning, but now he has been eating breakfast earlier and implementing a  mid-morning snack, which he hopes to continue when he returns to work  His appetite is getting better overall. He is trying to get more breakfast and healthy snacks.  He is also working on better portion control.   He is focused on reading labels to find healthier options.       Expected Outcome  He will eat breakfast and a mid-morning snack on his work break regularly to improve BG control  Short: Continue to work on eating breakfast.  Long: Continue reduce portions.         Personal Goal #2 Re-Evaluation   Personal Goal #2  Start to become more aware of the sodium content of foods by reading nutrition facts labels more regularly, and choose lower sodium varieties IE less than 146m per serving  -        Personal Goal #3 Re-Evaluation   Personal Goal #3  Continue to practice portion control when it comes to starch-heavy foods. Using the ice cream scoop for reference is a great strategy, good job!  -         Nutrition Goals Discharge (Final Nutrition Goals Re-Evaluation): Nutrition Goals Re-Evaluation - 03/22/18 0810      Goals   Nutrition Goal  Back to work on Sept 3.  Focus on breakfast and snacks. Reduce sodium.  Practice portion control.     Comment  His appetite is getting better overall. He is trying to get more breakfast and healthy snacks.  He is also working on better portion control.   He is focused on reading labels to find healthier options.     Expected Outcome  Short: Continue to work on eating breakfast.  Long: Continue reduce portions.         Psychosocial: Target Goals: Acknowledge presence or absence of significant depression and/or stress, maximize coping skills, provide positive support system. Participant is able to verbalize types and ability to use techniques and skills needed for reducing stress and depression.   Initial Review & Psychosocial Screening: Initial Psych Review & Screening - 03/07/18 1320      Initial Review   Current issues with  None Identified      Family Dynamics   Good Support System?  Yes   Spouse, daughter     Barriers   Psychosocial barriers to participate in program  The patient should benefit from training in stress management and relaxation.;There are no identifiable barriers or psychosocial needs.      Screening Interventions   Interventions  Encouraged to exercise;Provide feedback about the scores to participant;To provide support and resources with identified psychosocial needs    Expected Outcomes  Short Term goal: Utilizing psychosocial counselor, staff and physician to assist with identification of specific Stressors or current issues interfering with healing process. Setting desired goal for each stressor or current issue identified.;Long Term Goal: Stressors or current issues are controlled or eliminated.;Short Term goal: Identification and review with participant of any Quality of Life or Depression concerns found by scoring the questionnaire.;Long Term goal: The participant improves quality of Life and PHQ9 Scores as seen by post scores and/or verbalization of changes       Quality of Life Scores:  Quality of Life - 03/07/18 1322      Quality of Life   Select  Quality of Life      Quality of Life Scores   Health/Function Pre  15.47 %    Socioeconomic Pre  24 %    Psych/Spiritual Pre  22.5 %  Family Pre  24 %    GLOBAL Pre  19.93 %      Scores of 19 and below usually indicate a poorer quality of life in these areas.  A difference of  2-3 points is a clinically  meaningful difference.  A difference of 2-3 points in the total score of the Quality of Life Index has been associated with significant improvement in overall quality of life, self-image, physical symptoms, and general health in studies assessing change in quality of life.  PHQ-9: Recent Review Flowsheet Data    Depression screen [facility] 2/9 04/12/2018 03/31/2018 03/07/2018   Decreased Interest 0 0 0   Down, Depressed, Hopeless 0 0 0   PHQ - 2 Score 0 0 0   Altered sleeping '1 2 2   ' Tired, decreased energy 0 0 2   Change in appetite 0 0 2   Feeling bad or failure about yourself  0 0 0   Trouble concentrating 0 0 0   Moving slowly or fidgety/restless 0 0 0   Suicidal thoughts 0 0 0   PHQ-9 Score '1 2 6   ' Difficult doing work/chores Not difficult at all Somewhat difficult Somewhat difficult     Interpretation of Total Score  Total Score Depression Severity:  1-4 = Minimal depression, 5-9 = Mild depression, 10-14 = Moderate depression, 15-19 = Moderately severe depression, 20-27 = Severe depression   Psychosocial Evaluation and Intervention: Psychosocial Evaluation - 03/17/18 0956      Psychosocial Evaluation & Interventions   Interventions  Relaxation education;Stress management education;Encouraged to exercise with the program and follow exercise prescription    Comments  Counselor met with Mr. C[facility]ers [facility]) today for initial psychosocial evaluation.  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  Abe People has a strong support system with a spouse of 52 years and (3) adult children as well as a brother locally and a 26 year old grandson who lives in the home.  Abe People reports having difficulty sleeping since the surgery and is hoping exercise will help with that.  His appetite has improved lately.  Abe People denies a history of depression or anxiety or any current symptoms and states he is typically in a positive mood most of the time.  Billy's stress currently  are finances; his health; and his 2 year old grandson lives there and is having some behavioral problems.  He has goals to get back to work soon due to finances; to increase his strength; eat healthier and manage his stress better.  Counselor recommended speaking to his Dr/Pharmacist about an OTC sleep aid to improve his sleep quality and quantity.  Staff will follow with [facility].      Expected Outcomes  Short:  Abe People will talk with his Dr/Pharmacist about a natural sleep aid to help with his recent intermittent sleep issues.  He will exercise more consistently to help with stress and sleep concerns.  Long:  Abe People will develop a habit of exercise; healthy diet and positive self-care to manage his stress.    Continue Psychosocial Services   Follow up required by staff       Psychosocial Re-Evaluation: Psychosocial Re-Evaluation    [place] Name 03/22/18 0810 04/20/18 1657           Psychosocial Re-Evaluation   Current issues with  Current Sleep Concerns;Current Stress Concerns  -      Comments  Abe People has gotten back to sleeping  well.  He is feeling much better. He continues to remain postive overall.  He is enjoying the exercise and losing weight.    Counselor follow up with [facility] reporting feeling stronger and more energetic since coming into this program.  He is sleeping better most of the time and notices exercise helps with this as well.  Counselor commended [place] on progress made since coming into this program.        Expected Outcomes  Short: Continue to attend classes  Long: Continue to remain positive.   Short:  Abe People will continue to exercise for his health and mental health and to promote a better quality of sleep.  Long:  Abe People will routinely adhere to positive coping strategies including exercise.      Interventions  Encouraged to attend Cardiac Rehabilitation for the exercise  -      Continue Psychosocial Services   Follow up required by staff  Follow up required by staff          Psychosocial Discharge (Final Psychosocial Re-Evaluation): Psychosocial Re-Evaluation - 04/20/18 1657      Psychosocial Re-Evaluation   Comments  Counselor follow up with Abe People reporting feeling stronger and more energetic since coming into this program.  He is sleeping better most of the time and notices exercise helps with this as well.  Counselor commended [place] on progress made since coming into this program.      Expected Outcomes  Short:  Abe People will continue to exercise for his health and mental health and to promote a better quality of sleep.  Long:  Abe People will routinely adhere to positive coping strategies including exercise.    Continue Psychosocial Services   Follow up required by staff       Vocational Rehabilitation: Provide vocational rehab assistance to qualifying candidates.   Vocational Rehab Evaluation & Intervention: Vocational Rehab - 03/07/18 1327      Initial Vocational Rehab Evaluation & Intervention   Assessment shows need for Vocational Rehabilitation  No       Education: Education Goals: Education classes will be provided on a variety of topics geared toward better understanding of heart health and risk factor modification. Participant will state understanding/return demonstration of topics presented as noted by education test scores.  Learning Barriers/Preferences: Learning Barriers/Preferences - 03/07/18 1324      Learning Barriers/Preferences   Learning Barriers  None    Learning Preferences  None       Education Topics:  AED/CPR: - Group verbal and written instruction with the use of models to demonstrate the basic use of the AED with the basic ABC's of resuscitation.   General Nutrition Guidelines/Fats and Fiber: -Group instruction provided by verbal, written material, models and posters to present the general guidelines for heart healthy nutrition. Gives an explanation and review of dietary fats and fiber.   Cardiac Rehab from 04/27/2018 in  [facility] Cardiac and Pulmonary Rehab  Date  04/11/18  Educator  LB  Instruction Review Code  1- Verbalizes Understanding      Controlling Sodium/Reading Food Labels: -Group verbal and written material supporting the discussion of sodium use in heart healthy nutrition. Review and explanation with models, verbal and written materials for utilization of the food label.   Cardiac Rehab from 04/27/2018 in [facility] Cardiac and Pulmonary Rehab  Date  04/20/18  Educator  LB  Instruction Review Code  1- Verbalizes Understanding      Exercise Physiology & General Exercise Guidelines: - Group verbal and written instruction with  models to review the exercise physiology of the cardiovascular system and associated critical values. Provides general exercise guidelines with specific guidelines to those with heart or lung disease.    Cardiac Rehab from 04/27/2018 in [facility] Cardiac and Pulmonary Rehab  Date  04/27/18  Educator  Nada Maclachlan, EP  Instruction Review Code  1- Verbalizes Understanding      Aerobic Exercise & Resistance Training: - Gives group verbal and written instruction on the various components of exercise. Focuses on aerobic and resistive training programs and the benefits of this training and how to safely progress through these programs..   Cardiac Rehab from 04/27/2018 in [facility] Cardiac and Pulmonary Rehab  Date  03/10/18  Educator  AS  Instruction Review Code  1- Verbalizes Understanding      Flexibility, Balance, Mind/Body Relaxation: Provides group verbal/written instruction on the benefits of flexibility and balance training, including mind/body exercise modes such as yoga, pilates and tai chi.  Demonstration and skill practice provided.   Cardiac Rehab from 04/27/2018 in [facility] Cardiac and Pulmonary Rehab  Date  03/15/18  Educator  AS  Instruction Review Code  1- Verbalizes Understanding      Stress and Anxiety: - Provides group verbal and written instruction about the health risks  of elevated stress and causes of high stress.  Discuss the correlation between heart/lung disease and anxiety and treatment options. Review healthy ways to manage with stress and anxiety.   Depression: - Provides group verbal and written instruction on the correlation between heart/lung disease and depressed mood, treatment options, and the stigmas associated with seeking treatment.   Cardiac Rehab from 04/27/2018 in [facility] Cardiac and Pulmonary Rehab  Date  03/08/18  Educator  [facility]  Instruction Review Code  1- Verbalizes Understanding      Anatomy & Physiology of the Heart: - Group verbal and written instruction and models provide basic cardiac anatomy and physiology, with the coronary electrical and arterial systems. Review of Valvular disease and Heart Failure   Cardiac Procedures: - Group verbal and written instruction to review commonly prescribed medications for heart disease. Reviews the medication, class of the drug, and side effects. Includes the steps to properly store meds and maintain the prescription regimen. (beta blockers and nitrates)   Cardiac Rehab from 04/27/2018 in [facility] Cardiac and Pulmonary Rehab  Date  04/13/18  Educator  [facility]  Instruction Review Code  1- Verbalizes Understanding      Cardiac Medications I: - Group verbal and written instruction to review commonly prescribed medications for heart disease. Reviews the medication, class of the drug, and side effects. Includes the steps to properly store meds and maintain the prescription regimen.   Cardiac Rehab from 04/27/2018 in [facility] Cardiac and Pulmonary Rehab  Date  03/29/18  Educator  SB  Instruction Review Code  1- Verbalizes Understanding      Cardiac Medications II: -Group verbal and written instruction to review commonly prescribed medications for heart disease. Reviews the medication, class of the drug, and side effects. (all other drug classes)   Cardiac Rehab from 04/27/2018 in [facility] Cardiac and Pulmonary  Rehab  Date  03/17/18  Educator  CE  Instruction Review Code  1- Verbalizes Understanding       Go Sex-Intimacy & Heart Disease, Get SMART - Goal Setting: - Group verbal and written instruction through game format to discuss heart disease and the return to sexual intimacy. Provides group verbal and written material to discuss and apply goal setting through the application of  the S.M.A.R.T. Method.   Cardiac Rehab from 04/27/2018 in [facility] Cardiac and Pulmonary Rehab  Date  04/13/18  Educator  [facility]  Instruction Review Code  1- Verbalizes Understanding      Other Matters of the Heart: - Provides group verbal, written materials and models to describe Stable Angina and Peripheral Artery. Includes description of the disease process and treatment options available to the cardiac patient.   Exercise & Equipment Safety: - Individual verbal instruction and demonstration of equipment use and safety with use of the equipment.   Cardiac Rehab from 04/27/2018 in [facility] Cardiac and Pulmonary Rehab  Date  03/07/18  Educator  Sb  Instruction Review Code  1- Verbalizes Understanding      Infection Prevention: - Provides verbal and written material to individual with discussion of infection control including proper hand washing and proper equipment cleaning during exercise session.   Cardiac Rehab from 04/27/2018 in [facility] Cardiac and Pulmonary Rehab  Date  03/07/18  Educator  SB  Instruction Review Code  1- Verbalizes Understanding      Falls Prevention: - Provides verbal and written material to individual with discussion of falls prevention and safety.   Cardiac Rehab from 04/27/2018 in [facility] Cardiac and Pulmonary Rehab  Date  03/07/18  Educator  SB  Instruction Review Code  1- Verbalizes Understanding      Diabetes: - Individual verbal and written instruction to review signs/symptoms of diabetes, desired ranges of glucose level fasting, after meals and with exercise. Acknowledge that pre and  post exercise glucose checks will be done for 3 sessions at entry of program.   Know Your Numbers and Risk Factors: -Group verbal and written instruction about important numbers in your health.  Discussion of what are risk factors and how they play a role in the disease process.  Review of Cholesterol, Blood Pressure, Diabetes, and BMI and the role they play in your overall health.   Cardiac Rehab from 04/27/2018 in [facility] Cardiac and Pulmonary Rehab  Date  03/17/18  Educator  CE  Instruction Review Code  1- Verbalizes Understanding      Sleep Hygiene: -Provides group verbal and written instruction about how sleep can affect your health.  Define sleep hygiene, discuss sleep cycles and impact of sleep habits. Review good sleep hygiene tips.    Cardiac Rehab from 04/27/2018 in [facility] Cardiac and Pulmonary Rehab  Date  04/06/18  Educator  [facility]  Instruction Review Code  1- Verbalizes Understanding      Other: -Provides group and verbal instruction on various topics (see comments)   Knowledge Questionnaire Score: Knowledge Questionnaire Score - 03/07/18 1325      Knowledge Questionnaire Score   Pre Score  21/26   Reviewed corret responses with Billy today. He verbalized understanding of the responses. Areas missed Angina, PAD ,Exercise and Nutrition      Core Components/Risk Factors/Patient Goals at Admission: Personal Goals and Risk Factors at Admission - 03/07/18 1319      Core Components/Risk Factors/Patient Goals on Admission    Weight Management  Yes;Weight Gain    Intervention  Weight Management: Develop a combined nutrition and exercise program designed to reach desired caloric intake, while maintaining appropriate intake of nutrient and fiber, sodium and fats, and appropriate energy expenditure required for the weight goal.    Admit Weight  205 lb 12.8 oz (93.4 kg)    Goal Weight: Short Term  210 lb (95.3 kg)    Goal Weight: Long Term  220 lb (  99.8 kg)    Expected Outcomes   Short Term: Continue to assess and modify interventions until short term weight is achieved;Long Term: Adherence to nutrition and physical activity/exercise program aimed toward attainment of established weight goal;Weight Gain: Understanding of general recommendations for a high calorie, high protein meal plan that promotes weight gain by distributing calorie intake throughout the day with the consumption for 4-5 meals, snacks, and/or supplements    Diabetes  Yes    Intervention  Provide education about signs/symptoms and action to take for hypo/hyperglycemia.;Provide education about proper nutrition, including hydration, and aerobic/resistive exercise prescription along with prescribed medications to achieve blood glucose in normal ranges: Fasting glucose 65-99 mg/dL    Expected Outcomes  Short Term: Participant verbalizes understanding of the signs/symptoms and immediate care of hyper/hypoglycemia, proper foot care and importance of medication, aerobic/resistive exercise and nutrition plan for blood glucose control.;Long Term: Attainment of HbA1C < 7%.    Hypertension  Yes    Intervention  Provide education on lifestyle modifcations including regular physical activity/exercise, weight management, moderate sodium restriction and increased consumption of fresh fruit, vegetables, and low fat dairy, alcohol moderation, and smoking cessation.;Monitor prescription use compliance.    Expected Outcomes  Short Term: Continued assessment and intervention until BP is < 140/71m HG in hypertensive participants. < 130/879mHG in hypertensive participants with diabetes, heart failure or chronic kidney disease.;Long Term: Maintenance of blood pressure at goal levels.    Lipids  Yes    Intervention  Provide education and support for participant on nutrition & aerobic/resistive exercise along with prescribed medications to achieve LDL <7059mHDL >27m59m  Expected Outcomes  Short Term: Participant states understanding of  desired cholesterol values and is compliant with medications prescribed. Participant is following exercise prescription and nutrition guidelines.;Long Term: Cholesterol controlled with medications as prescribed, with individualized exercise RX and with personalized nutrition plan. Value goals: LDL < 70mg62mL > 40 mg.       Core Components/Risk Factors/Patient Goals Review:  Goals and Risk Factor Review    Row Name 03/22/18 0807 04/22/18 0830           Core Components/Risk Factors/Patient Goals Review   Personal Goals Review  Weight Management/Obesity;Hypertension;Lipids  Weight Management/Obesity;Hypertension;Lipids;Other      Review  BillyAbe Peoplea run of SVT over weekend for about 20min56m dropped his blood pressure.  He has a call out to the cardiologist. He is losing a little a weight.  Today he is 201 but has been as low as 198lbs.  Overall his blood pressures are doing well and he checks them at home frequently.  He is doing well on his medications.   Billy Abe Peoplerkin gtowards goal weigh tof 190 lb.  He is at 199 today. He has been cutting out fatty and salty foods and not eating as much.  He tried a new med for SVT and it was causing too much fatigue.  He is back on metoprolol and reports short runs of SVT lasting < 2min. 10m is walking on days not as HT for exercise.      Expected Outcomes  Short: Make sure he hears back from cardiologist.  Long: Continue to work on weight loss.   Short - continue to eat heart healthy diet and exercise consistently  Long - reach goal weight and maintain heart healthy diet and exercise          Core Components/Risk Factors/Patient Goals at Discharge (Final Review):  Goals and  Risk Factor Review - 04/22/18 0830      Core Components/Risk Factors/Patient Goals Review   Personal Goals Review  Weight Management/Obesity;Hypertension;Lipids;Other    Review  Abe People is workin gtowards goal weigh tof 190 lb.  He is at 199 today. He has been cutting out fatty and  salty foods and not eating as much.  He tried a new med for SVT and it was causing too much fatigue.  He is back on metoprolol and reports short runs of SVT lasting < 41mn.  He is walking on days not as HT for exercise.    Expected Outcomes  Short - continue to eat heart healthy diet and exercise consistently  Long - reach goal weight and maintain heart healthy diet and exercise        ITP Comments: ITP Comments    Row Name 03/16/18 0812 03/22/18 1150 04/13/18 0556 05/11/18 0742     ITP Comments  30 day review completed. ITP sent to Dr. BRamonita Lab covering for Dr. MEmily Filbert Medical Director of Cardiac Rehab. Continue with ITP unless changes are made by physician.  New to program  During education, BAbe Peoplenoted that he was having palpitations again like he did over the weekend.  We hooked him back up on the monitor and he was in SVT.  He had called cardiology yesterday with no return call.  RN called office today during education. BAbe Peoplewas sent up to his cardiologist after class.   30 day review completed. ITP sent to Dr. MEmily Filbert Medical Director of Cardiac Rehab. Continue with ITP unless changes are made by physician  30 day review completed. ITP sent to Dr. MEmily Filbert Medical Director of Cardiac Rehab. Continue with ITP unless changes are made by physician       Comments: discharge ITP

## 2018-05-26 NOTE — Progress Notes (Signed)
Discharge Progress Report  Patient Details  Name: Ricardo Brown MRN: [id number] Date of Birth: 03/02/1961 Referring Provider:     Cardiac Rehab from 03/07/2018 in [facility]  Referring Provider  Isaias Cowman MD       Number of Visits: 26  Reason for Discharge:  Early Exit:  Lack of attendance  Smoking History:  Social History   Tobacco Use  Smoking Status Never Smoker  Smokeless Tobacco Never Used    Diagnosis:  No diagnosis found.  ADL UCSD:   Initial Exercise Prescription: Initial Exercise Prescription - 03/07/18 1500      Date of Initial Exercise RX and Referring Provider   Date  03/07/18    Referring Provider  Paraschos, Alexander MD      Treadmill   MPH  2.5    Grade  1    Minutes  15    METs  3.26      REL-XR   Level  3    Speed  50    Minutes  15    METs  3      T5 Nustep   Level  3    SPM  80    Minutes  15    METs  3      Prescription Details   Frequency (times per week)  2    Duration  Progress to 30 minutes of continuous aerobic without signs/symptoms of physical distress      Intensity   THRR 40-80% of Max Heartrate  118-149    Ratings of Perceived Exertion  11-13    Perceived Dyspnea  0-4      Progression   Progression  Continue to progress workloads to maintain intensity without signs/symptoms of physical distress.      Resistance Training   Training Prescription  Yes    Weight  4 lbs    Reps  10-15       Discharge Exercise Prescription (Final Exercise Prescription Changes): Exercise Prescription Changes - 05/05/18 0800      Response to Exercise   Blood Pressure (Admit)  124/62    Blood Pressure (Exercise)  144/76    Blood Pressure (Exit)  122/60    Heart Rate (Admit)  91 bpm    Heart Rate (Exercise)  111 bpm    Heart Rate (Exit)  98 bpm    Rating of Perceived Exertion (Exercise)  14    Symptoms  none    Duration  Continue with 45 min of aerobic exercise without signs/symptoms of  physical distress.    Intensity  THRR unchanged      Progression   Progression  Continue to progress workloads to maintain intensity without signs/symptoms of physical distress.    Average METs  3.9      Resistance Training   Training Prescription  Yes    Weight  8 lb    Reps  10-15      Interval Training   Interval Training  No      Treadmill   MPH  3.2    Grade  2.5    Minutes  15    METs  4.95      REL-XR   Level  5    Minutes  15    METs  3.7      T5 Nustep   Level  3    SPM  80    Minutes  15    METs  3  Home Exercise Plan   Plans to continue exercise at  Home (comment)   walks outside or at the mall, dumbbell exercises    Frequency  Add 3 additional days to program exercise sessions.    Initial Home Exercises Provided  03/17/18       Functional Capacity: 6 Minute Walk    Row Name 03/07/18 1528         6 Minute Walk   Phase  Initial     Distance  1294 feet     Walk Time  6 minutes     # of Rest Breaks  0     MPH  2.45     METS  3.7     RPE  10     VO2 Peak  12.95     Symptoms  No     Resting HR  88 bpm     Resting BP  136/66     Resting Oxygen Saturation   99 %     Exercise Oxygen Saturation  during 6 min walk  99 %     Max Ex. HR  102 bpm     Max Ex. BP  152/74     2 Minute Post BP  122/66        Psychological, QOL, Others - Outcomes: PHQ 2/9: Depression screen [facility] 2/9 04/12/2018 03/31/2018 03/07/2018  Decreased Interest 0 0 0  Down, Depressed, Hopeless 0 0 0  PHQ - 2 Score 0 0 0  Altered sleeping 1 2 2   Tired, decreased energy 0 0 2  Change in appetite 0 0 2  Feeling bad or failure about yourself  0 0 0  Trouble concentrating 0 0 0  Moving slowly or fidgety/restless 0 0 0  Suicidal thoughts 0 0 0  PHQ-9 Score 1 2 6   Difficult doing work/chores Not difficult at all Somewhat difficult Somewhat difficult    Quality of Life: Quality of Life - 03/07/18 1322      Quality of Life   Select  Quality of Life      Quality of Life  Scores   Health/Function Pre  15.47 %    Socioeconomic Pre  24 %    Psych/Spiritual Pre  22.5 %    Family Pre  24 %    GLOBAL Pre  19.93 %       Personal Goals: Goals established at orientation with interventions provided to work toward goal. Personal Goals and Risk Factors at Admission - 03/07/18 1319      Core Components/Risk Factors/Patient Goals on Admission    Weight Management  Yes;Weight Gain    Intervention  Weight Management: Develop a combined nutrition and exercise program designed to reach desired caloric intake, while maintaining appropriate intake of nutrient and fiber, sodium and fats, and appropriate energy expenditure required for the weight goal.    Admit Weight  205 lb 12.8 oz (93.4 kg)    Goal Weight: Short Term  210 lb (95.3 kg)    Goal Weight: Long Term  220 lb (99.8 kg)    Expected Outcomes  Short Term: Continue to assess and modify interventions until short term weight is achieved;Long Term: Adherence to nutrition and physical activity/exercise program aimed toward attainment of established weight goal;Weight Gain: Understanding of general recommendations for a high calorie, high protein meal plan that promotes weight gain by distributing calorie intake throughout the day with the consumption for 4-5 meals, snacks, and/or supplements    Diabetes  Yes  Intervention  Provide education about signs/symptoms and action to take for hypo/hyperglycemia.;Provide education about proper nutrition, including hydration, and aerobic/resistive exercise prescription along with prescribed medications to achieve blood glucose in normal ranges: Fasting glucose 65-99 mg/dL    Expected Outcomes  Short Term: Participant verbalizes understanding of the signs/symptoms and immediate care of hyper/hypoglycemia, proper foot care and importance of medication, aerobic/resistive exercise and nutrition plan for blood glucose control.;Long Term: Attainment of HbA1C < 7%.    Hypertension  Yes     Intervention  Provide education on lifestyle modifcations including regular physical activity/exercise, weight management, moderate sodium restriction and increased consumption of fresh fruit, vegetables, and low fat dairy, alcohol moderation, and smoking cessation.;Monitor prescription use compliance.    Expected Outcomes  Short Term: Continued assessment and intervention until BP is < 140/52mm HG in hypertensive participants. < 130/58mm HG in hypertensive participants with diabetes, heart failure or chronic kidney disease.;Long Term: Maintenance of blood pressure at goal levels.    Lipids  Yes    Intervention  Provide education and support for participant on nutrition & aerobic/resistive exercise along with prescribed medications to achieve LDL 70mg , HDL >40mg .    Expected Outcomes  Short Term: Participant states understanding of desired cholesterol values and is compliant with medications prescribed. Participant is following exercise prescription and nutrition guidelines.;Long Term: Cholesterol controlled with medications as prescribed, with individualized exercise RX and with personalized nutrition plan. Value goals: LDL < 70mg , HDL > 40 mg.        Personal Goals Discharge: Goals and Risk Factor Review    Row Name 03/22/18 0807 04/22/18 0830           Core Components/Risk Factors/Patient Goals Review   Personal Goals Review  Weight Management/Obesity;Hypertension;Lipids  Weight Management/Obesity;Hypertension;Lipids;Other      Review  Ricardo Brown had a run of SVT over weekend for about 33min and dropped his blood pressure.  He has a call out to the cardiologist. He is losing a little a weight.  Today he is 201 but has been as low as 198lbs.  Overall his blood pressures are doing well and he checks them at home frequently.  He is doing well on his medications.   Ricardo Brown is workin gtowards goal weigh tof 190 lb.  He is at 199 today. He has been cutting out fatty and salty foods and not eating as much.   He tried a new med for SVT and it was causing too much fatigue.  He is back on metoprolol and reports short runs of SVT lasting < 41min.  He is walking on days not as HT for exercise.      Expected Outcomes  Short: Make sure he hears back from cardiologist.  Long: Continue to work on weight loss.   Short - continue to eat heart healthy diet and exercise consistently  Long - reach goal weight and maintain heart healthy diet and exercise          Exercise Goals and Review: Exercise Goals    Row Name 03/07/18 1531             Exercise Goals   Increase Physical Activity  Yes       Intervention  Provide advice, education, support and counseling about physical activity/exercise needs.;Develop an individualized exercise prescription for aerobic and resistive training based on initial evaluation findings, risk stratification, comorbidities and participant's personal goals.       Expected Outcomes  Short Term: Attend rehab on a regular basis to increase  amount of physical activity.;Long Term: Add in home exercise to make exercise part of routine and to increase amount of physical activity.;Long Term: Exercising regularly at least 3-5 days a week.       Increase Strength and Stamina  Yes       Intervention  Provide advice, education, support and counseling about physical activity/exercise needs.;Develop an individualized exercise prescription for aerobic and resistive training based on initial evaluation findings, risk stratification, comorbidities and participant's personal goals.       Expected Outcomes  Short Term: Increase workloads from initial exercise prescription for resistance, speed, and METs.;Short Term: Perform resistance training exercises routinely during rehab and add in resistance training at home;Long Term: Improve cardiorespiratory fitness, muscular endurance and strength as measured by increased METs and functional capacity (6MWT)       Able to understand and use rate of perceived  exertion (RPE) scale  Yes       Intervention  Provide education and explanation on how to use RPE scale       Expected Outcomes  Short Term: Able to use RPE daily in rehab to express subjective intensity level;Long Term:  Able to use RPE to guide intensity level when exercising independently       Able to understand and use Dyspnea scale  Yes       Intervention  Provide education and explanation on how to use Dyspnea scale       Expected Outcomes  Short Term: Able to use Dyspnea scale daily in rehab to express subjective sense of shortness of breath during exertion;Long Term: Able to use Dyspnea scale to guide intensity level when exercising independently       Knowledge and understanding of Target Heart Rate Range (THRR)  Yes       Intervention  Provide education and explanation of THRR including how the numbers were predicted and where they are located for reference       Expected Outcomes  Short Term: Able to state/look up THRR;Short Term: Able to use daily as guideline for intensity in rehab;Long Term: Able to use THRR to govern intensity when exercising independently       Able to check pulse independently  Yes       Intervention  Provide education and demonstration on how to check pulse in carotid and radial arteries.;Review the importance of being able to check your own pulse for safety during independent exercise       Expected Outcomes  Short Term: Able to explain why pulse checking is important during independent exercise;Long Term: Able to check pulse independently and accurately       Understanding of Exercise Prescription  Yes       Intervention  Provide education, explanation, and written materials on patient's individual exercise prescription       Expected Outcomes  Short Term: Able to explain program exercise prescription;Long Term: Able to explain home exercise prescription to exercise independently          Nutrition & Weight - Outcomes: Pre Biometrics - 03/07/18 1531      Pre  Biometrics   Height  5' 9.2" (1.758 m)    Weight  205 lb 12.8 oz (93.4 kg)    Waist Circumference  41.5 inches    Hip Circumference  38 inches    Waist to Hip Ratio  1.09 %    BMI (Calculated)  30.2    Single Leg Stand  2.57 seconds  Nutrition: Nutrition Therapy & Goals - 03/15/18 1139      Nutrition Therapy   Diet  DM    Protein (specify units)  12-13oz    Fiber  35 grams    Whole Grain Foods  3 servings   chooses whole grains semi-regularly   Saturated Fats  16 max. grams    Fruits and Vegetables  5 servings/day   8 ideal   Sodium  1500 grams      Personal Nutrition Goals   Nutrition Goal  When you return to work, try to keep the current early breakfast and mid-morning snack schedule that you've implemented, rather than waiting to eat and skipping a snack    Personal Goal #2  Start to become more aware of the sodium content of foods by reading nutrition facts labels more regularly, and choose lower sodium varieties IE less than 150mg  per serving    Personal Goal #3  Continue to practice portion control when it comes to starch-heavy foods. Using the ice cream scoop for reference is a great strategy, good job!    Comments  He has started to monitor his sodium intake since hospitalization. Does not drink sugar sweetened beverages and gets a variety of fiber and protein sources throughout the day. He has recently changed his morning eating schedule which is better suited for DM management      Intervention Plan   Intervention  Prescribe, educate and counsel regarding individualized specific dietary modifications aiming towards targeted core components such as weight, hypertension, lipid management, diabetes, heart failure and other comorbidities.;Nutrition handout(s) given to patient.   Low sodium nutrition therapy, Salt food swaps handouts   Expected Outcomes  Short Term Goal: Understand basic principles of dietary content, such as calories, fat, sodium, cholesterol and  nutrients.;Short Term Goal: A plan has been developed with personal nutrition goals set during dietitian appointment.;Long Term Goal: Adherence to prescribed nutrition plan.       Nutrition Discharge: Nutrition Assessments - 03/07/18 1319      MEDFICTS Scores   Pre Score  108       Education Questionnaire Score: Knowledge Questionnaire Score - 03/07/18 1325      Knowledge Questionnaire Score   Pre Score  21/26   Reviewed corret responses with Billy today. He verbalized understanding of the responses. Areas missed Angina, PAD ,Exercise and Nutrition      Goals reviewed with patient; copy given to patient.

## 2018-07-14 ENCOUNTER — Ambulatory Visit: Payer: BLUE CROSS/BLUE SHIELD | Admitting: Podiatry

## 2018-07-14 DIAGNOSIS — I509 Heart failure, unspecified: Secondary | ICD-10-CM | POA: Diagnosis not present

## 2018-07-14 DIAGNOSIS — E785 Hyperlipidemia, unspecified: Secondary | ICD-10-CM | POA: Diagnosis not present

## 2018-07-14 DIAGNOSIS — R2681 Unsteadiness on feet: Secondary | ICD-10-CM | POA: Diagnosis not present

## 2018-07-14 DIAGNOSIS — I471 Supraventricular tachycardia: Secondary | ICD-10-CM | POA: Diagnosis not present

## 2018-07-19 DIAGNOSIS — I471 Supraventricular tachycardia: Secondary | ICD-10-CM | POA: Diagnosis not present

## 2018-07-19 DIAGNOSIS — R0600 Dyspnea, unspecified: Secondary | ICD-10-CM | POA: Diagnosis not present

## 2018-07-19 DIAGNOSIS — I499 Cardiac arrhythmia, unspecified: Secondary | ICD-10-CM | POA: Diagnosis not present

## 2018-07-21 DIAGNOSIS — E119 Type 2 diabetes mellitus without complications: Secondary | ICD-10-CM | POA: Diagnosis not present

## 2018-07-21 DIAGNOSIS — I471 Supraventricular tachycardia: Secondary | ICD-10-CM | POA: Diagnosis not present

## 2018-07-21 DIAGNOSIS — E785 Hyperlipidemia, unspecified: Secondary | ICD-10-CM | POA: Diagnosis not present

## 2018-07-21 DIAGNOSIS — I10 Essential (primary) hypertension: Secondary | ICD-10-CM | POA: Diagnosis not present

## 2018-09-15 DIAGNOSIS — E113513 Type 2 diabetes mellitus with proliferative diabetic retinopathy with macular edema, bilateral: Secondary | ICD-10-CM | POA: Diagnosis not present

## 2018-09-15 DIAGNOSIS — H3582 Retinal ischemia: Secondary | ICD-10-CM | POA: Diagnosis not present

## 2018-09-15 DIAGNOSIS — H35372 Puckering of macula, left eye: Secondary | ICD-10-CM | POA: Diagnosis not present

## 2018-11-03 ENCOUNTER — Other Ambulatory Visit: Payer: Self-pay

## 2018-11-03 ENCOUNTER — Other Ambulatory Visit: Payer: Self-pay | Admitting: Internal Medicine

## 2018-11-03 ENCOUNTER — Other Ambulatory Visit
Admission: RE | Admit: 2018-11-03 | Discharge: 2018-11-03 | Disposition: A | Discharge status: Home / Self Care | Payer: Self-pay | Source: Ambulatory Visit | Attending: Internal Medicine | Admitting: Internal Medicine

## 2018-11-03 ENCOUNTER — Ambulatory Visit
Admission: RE | Admit: 2018-11-03 | Discharge: 2018-11-03 | Disposition: A | Discharge status: Home / Self Care | Payer: Self-pay | Source: Ambulatory Visit | Attending: Internal Medicine | Admitting: Internal Medicine

## 2018-11-03 DIAGNOSIS — R05 Cough: Secondary | ICD-10-CM

## 2018-11-03 DIAGNOSIS — R0602 Shortness of breath: Secondary | ICD-10-CM

## 2018-11-03 DIAGNOSIS — R059 Cough, unspecified: Secondary | ICD-10-CM

## 2018-11-03 LAB — ELECTROLYTE PANEL
Anion gap: 8 (ref 5–15)
CO2: 27 mmol/L (ref 22–32)
Chloride: 102 mmol/L (ref 98–111)
Potassium: 3.9 mmol/L (ref 3.5–5.1)
Sodium: 137 mmol/L (ref 135–145)

## 2018-11-03 LAB — CBC
HCT: 39.7 % (ref 39.0–52.0)
Hemoglobin: 12.9 g/dL — ABNORMAL LOW (ref 13.0–17.0)
MCH: 29.5 pg (ref 26.0–34.0)
MCHC: 32.5 g/dL (ref 30.0–36.0)
MCV: 90.6 fL (ref 80.0–100.0)
Platelets: 162 10*3/uL (ref 150–400)
RBC: 4.38 MIL/uL (ref 4.22–5.81)
RDW: 13.9 % (ref 11.5–15.5)
WBC: 5.4 10*3/uL (ref 4.0–10.5)
nRBC: 0 % (ref 0.0–0.2)

## 2018-11-11 ENCOUNTER — Other Ambulatory Visit: Payer: Self-pay | Admitting: Internal Medicine

## 2018-11-11 ENCOUNTER — Ambulatory Visit
Admission: RE | Admit: 2018-11-11 | Discharge: 2018-11-11 | Disposition: A | Discharge status: Home / Self Care | Payer: No Typology Code available for payment source | Source: Ambulatory Visit | Attending: Internal Medicine | Admitting: Internal Medicine

## 2018-11-11 ENCOUNTER — Other Ambulatory Visit: Payer: Self-pay

## 2018-11-11 DIAGNOSIS — I509 Heart failure, unspecified: Secondary | ICD-10-CM

## 2019-01-05 ENCOUNTER — Telehealth: Payer: Self-pay | Admitting: Cardiovascular Disease

## 2019-01-05 NOTE — Telephone Encounter (Signed)
Recieved request from : [employer]  Scanned to ROI  Forwarded to [employer] for processing

## 2019-04-04 IMAGING — CR CHEST - 2 VIEW
1 series · 2 of 2 positions shown · non-contrast
Comparison: Chest radiograph 01/24/2018

CLINICAL DATA: Patient with cough and shortness of breath.

EXAM:
CHEST - 2 VIEW

[Series 1: dg chest 2 view · 0.14mm/px · 2 of 2 slices shown]
[im 1/2]
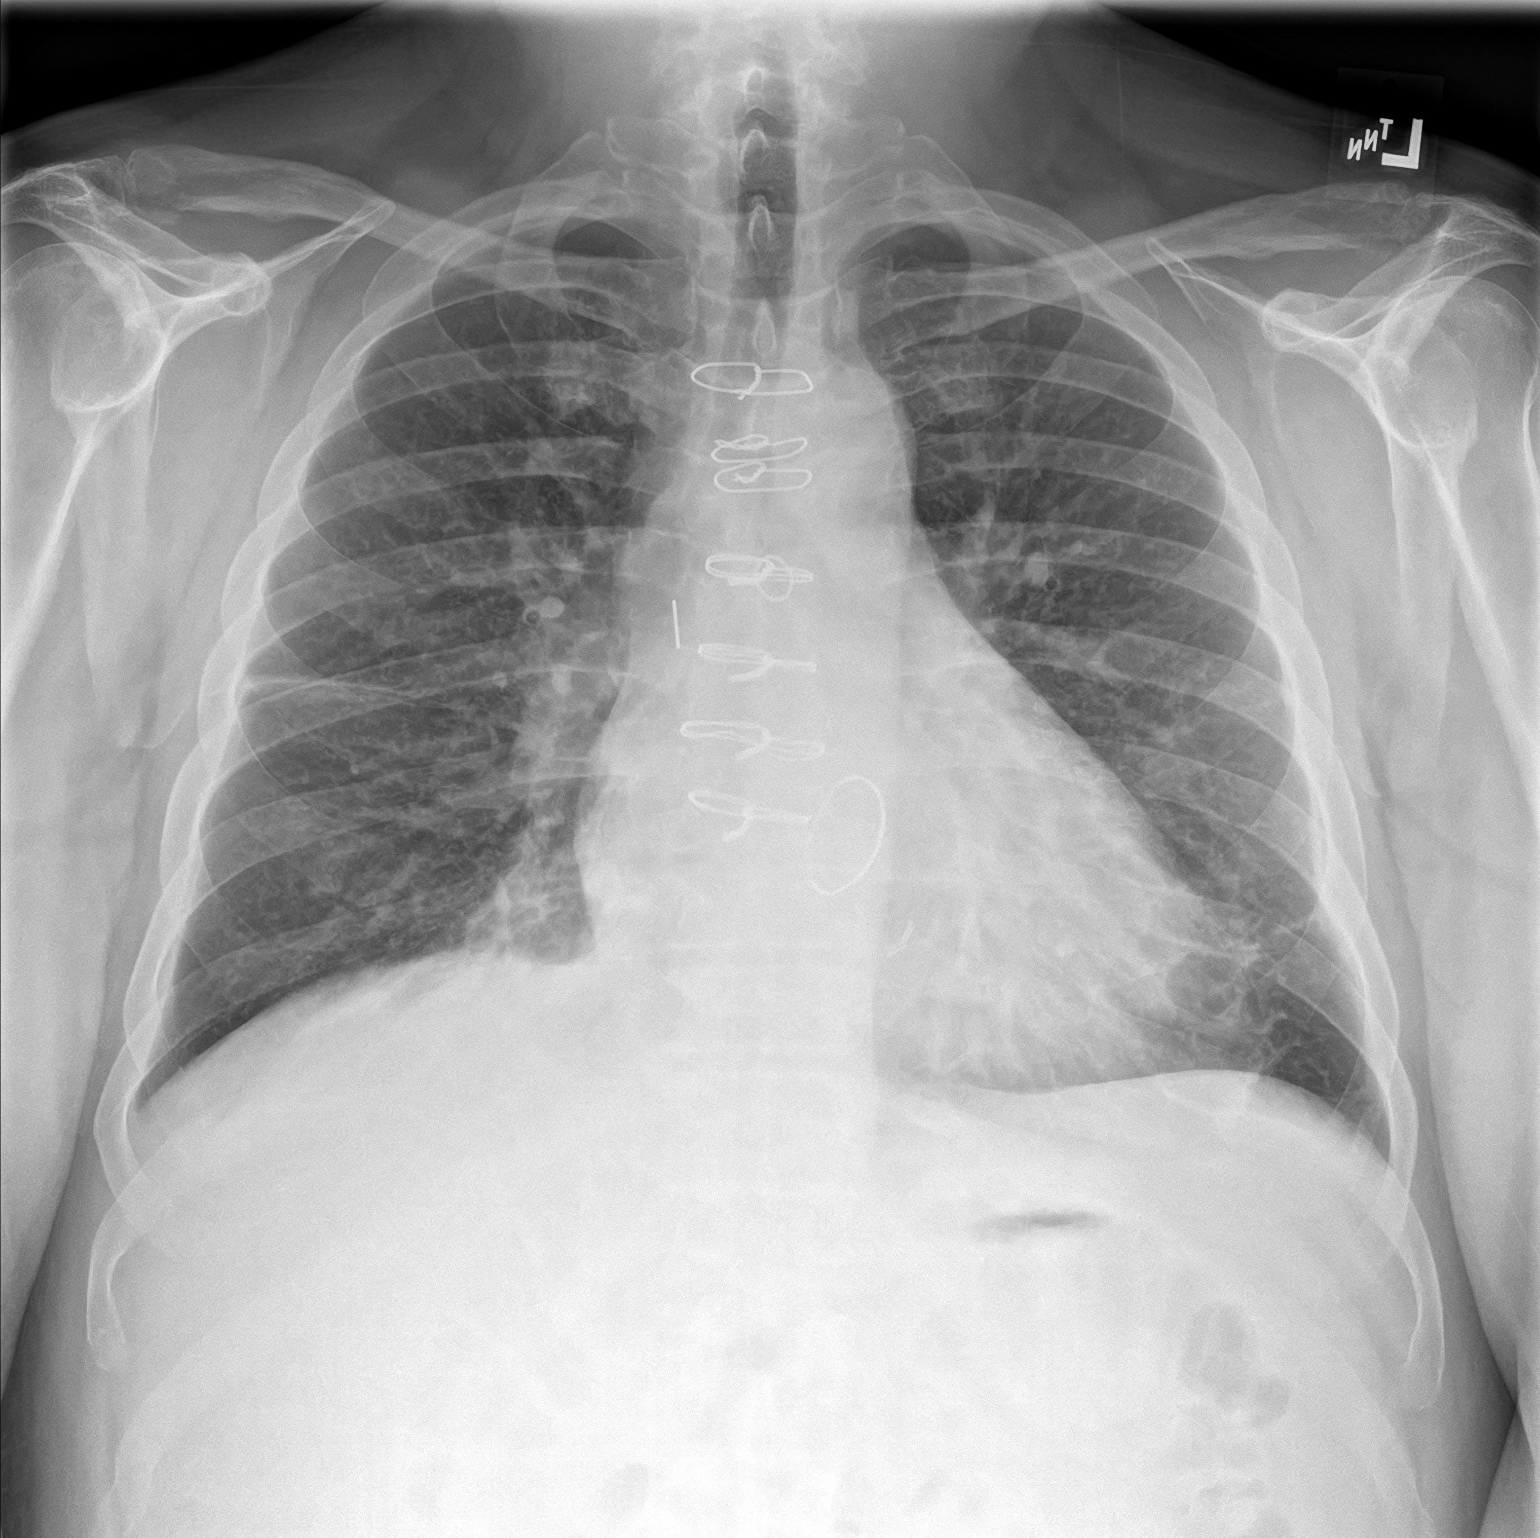
[im 2/2]
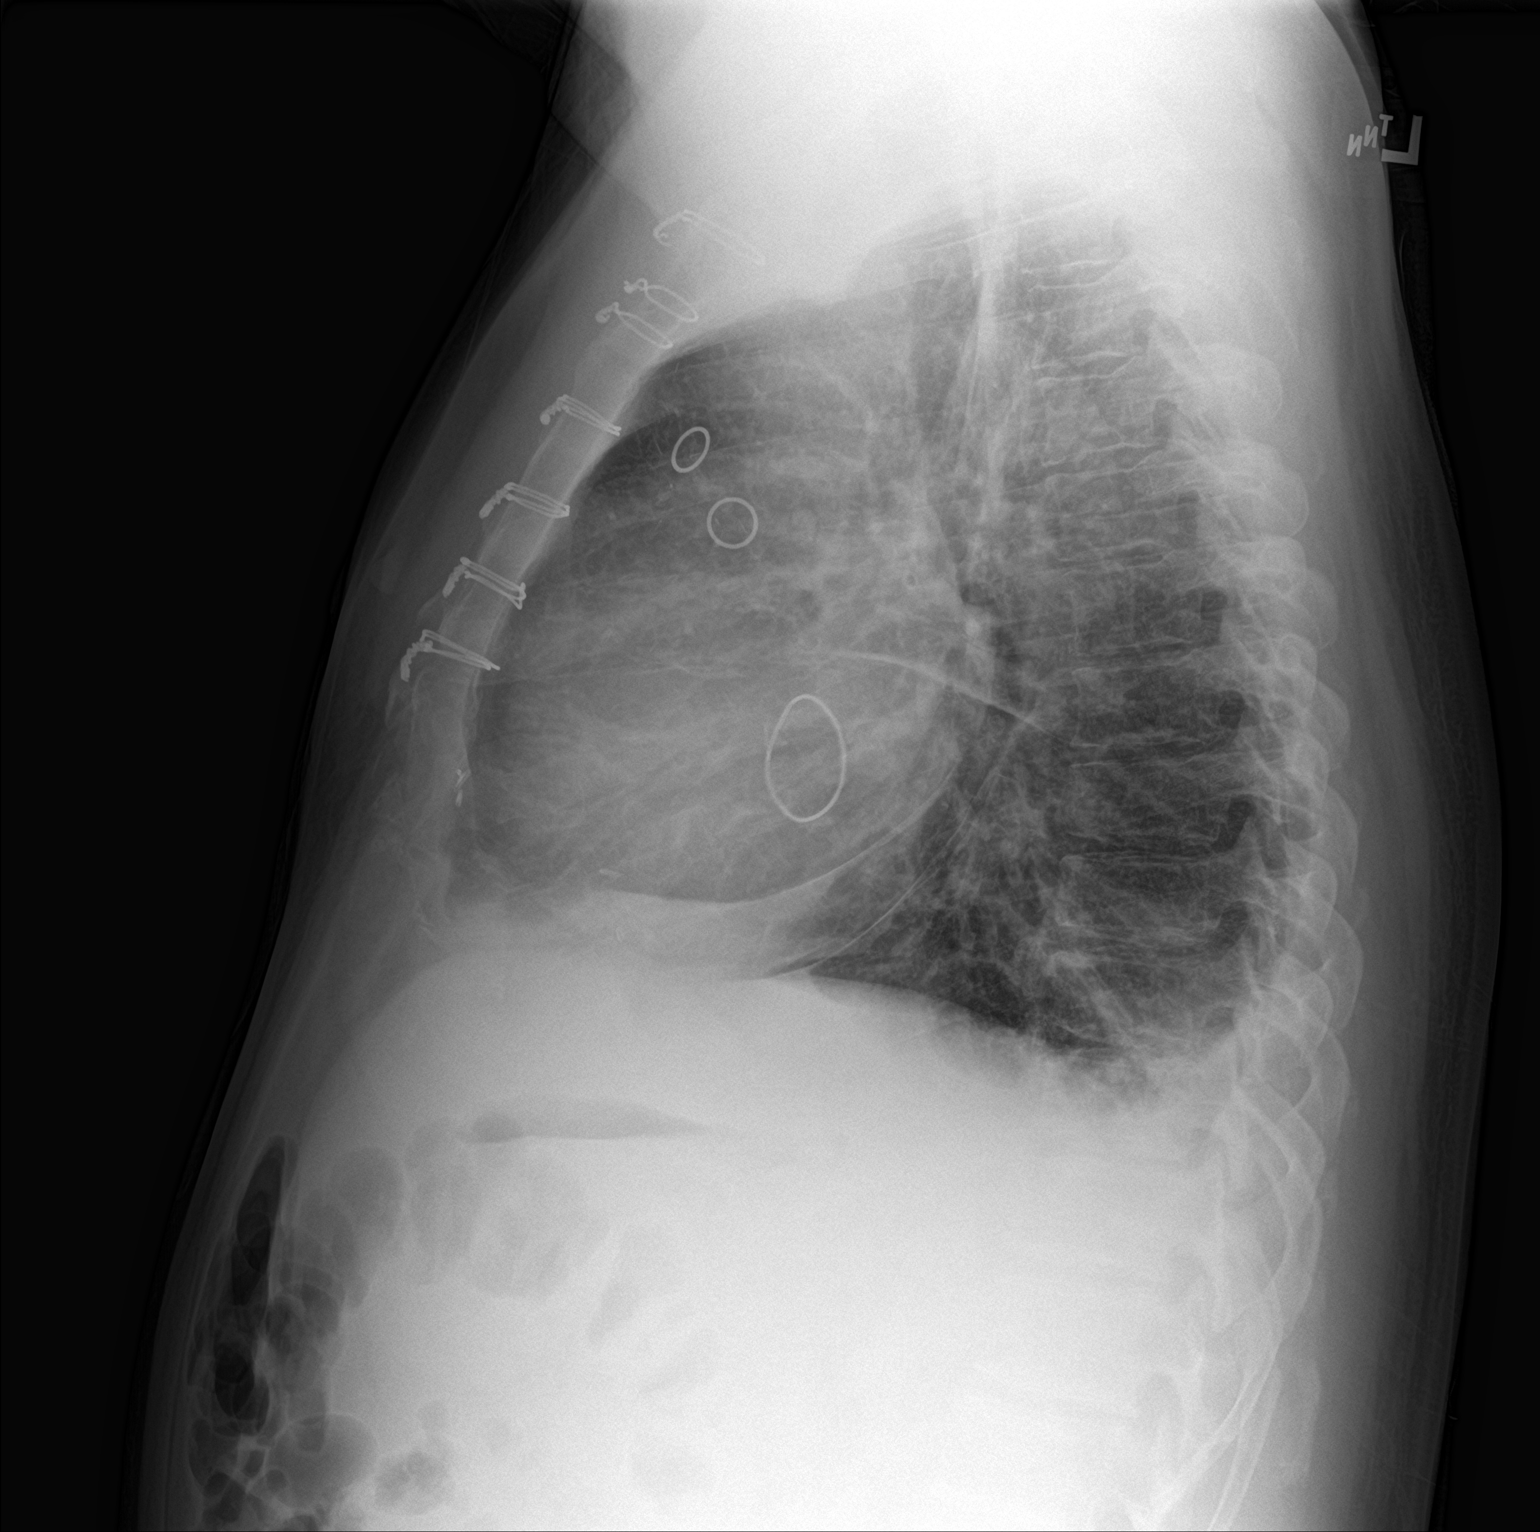

[2 of 2 positions shown; findings below may reference images not displayed]

FINDINGS: Stable cardiomegaly. Patient status post median sternotomy. Mild
bilateral interstitial pulmonary opacities. Small bilateral pleural
effusions. Thoracic spine degenerative changes.
IMPRESSION: Findings suggestive of interstitial edema and small effusions.

## 2019-04-12 IMAGING — CR CHEST - 2 VIEW
1 series · 2 of 2 positions shown · non-contrast
Comparison: 11/03/2018

CLINICAL DATA: Congestive heart failure

EXAM:
CHEST - 2 VIEW

[Series 1: dg chest 2 view · 0.14mm/px · 2 of 2 slices shown]
[im 1/2]
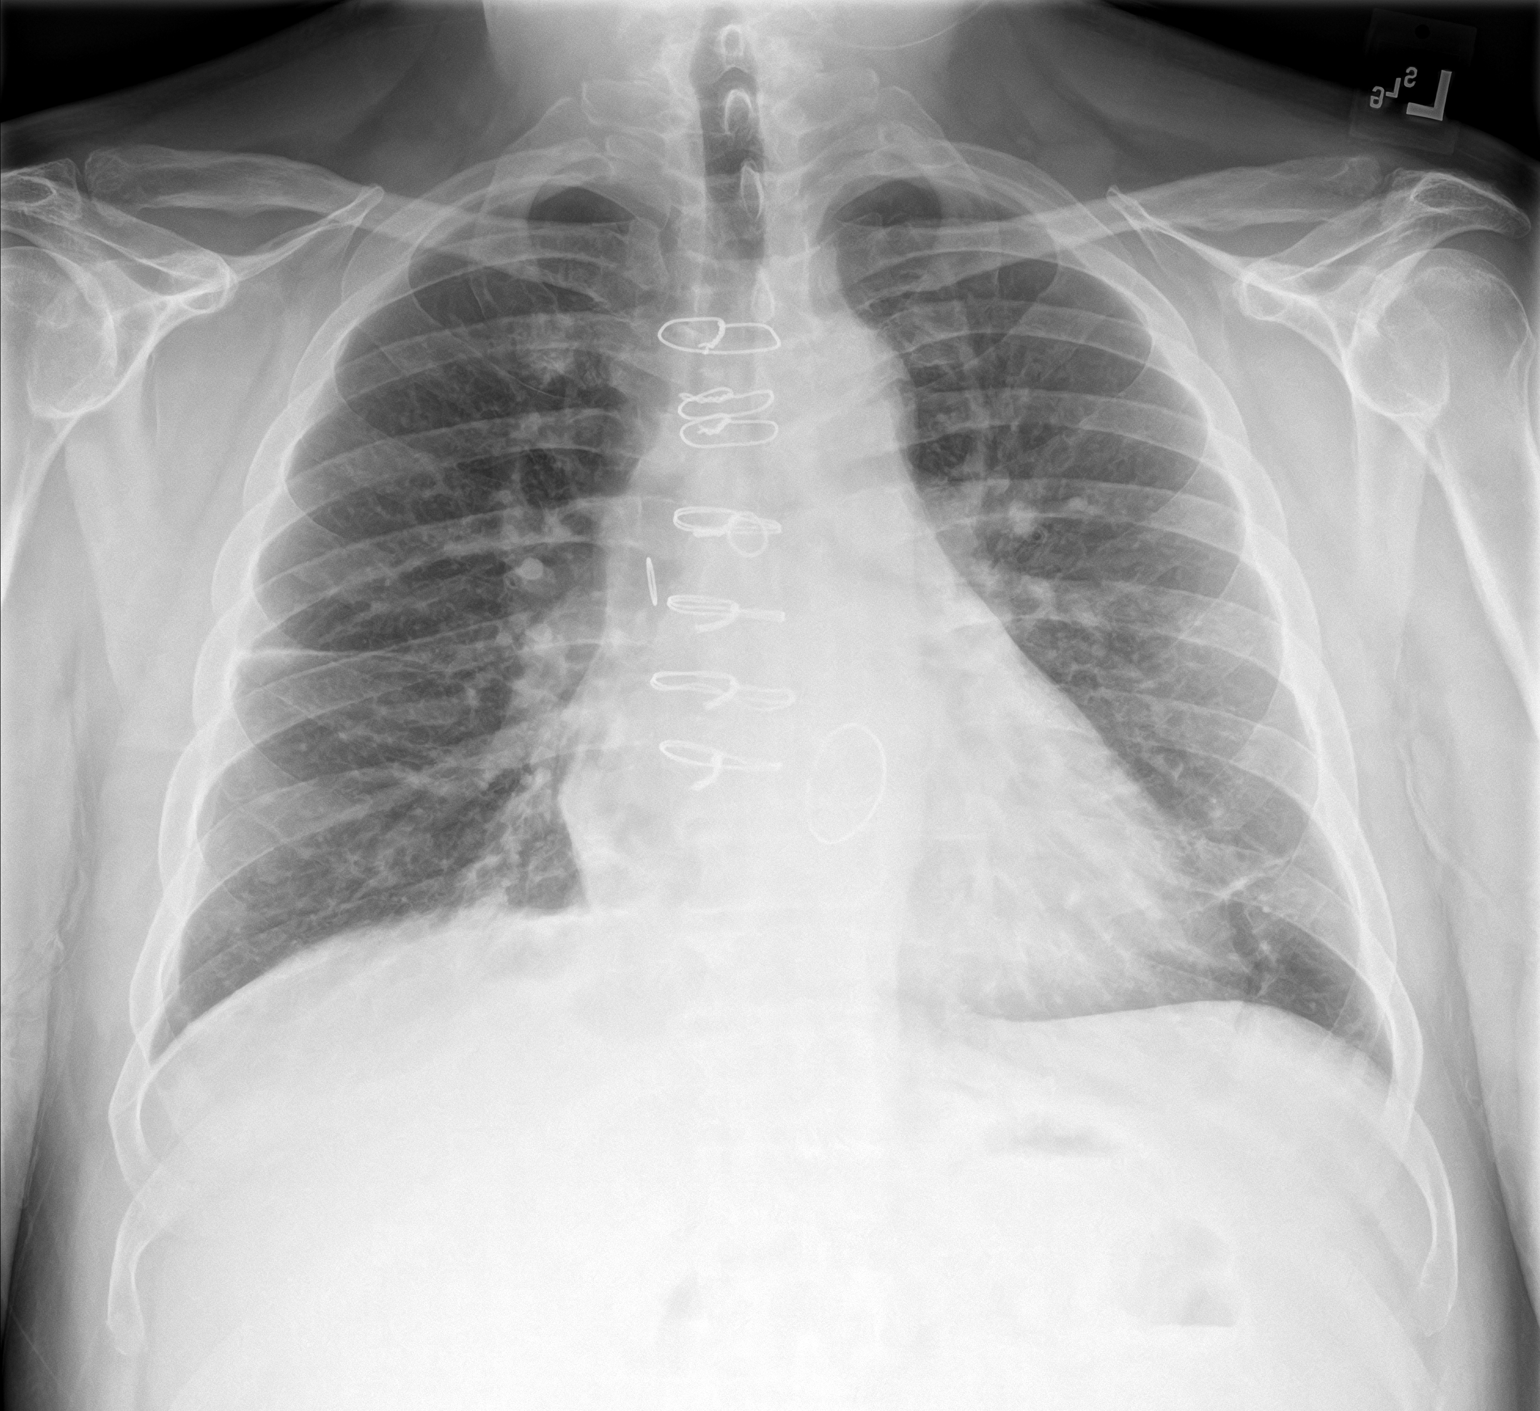
[im 2/2]
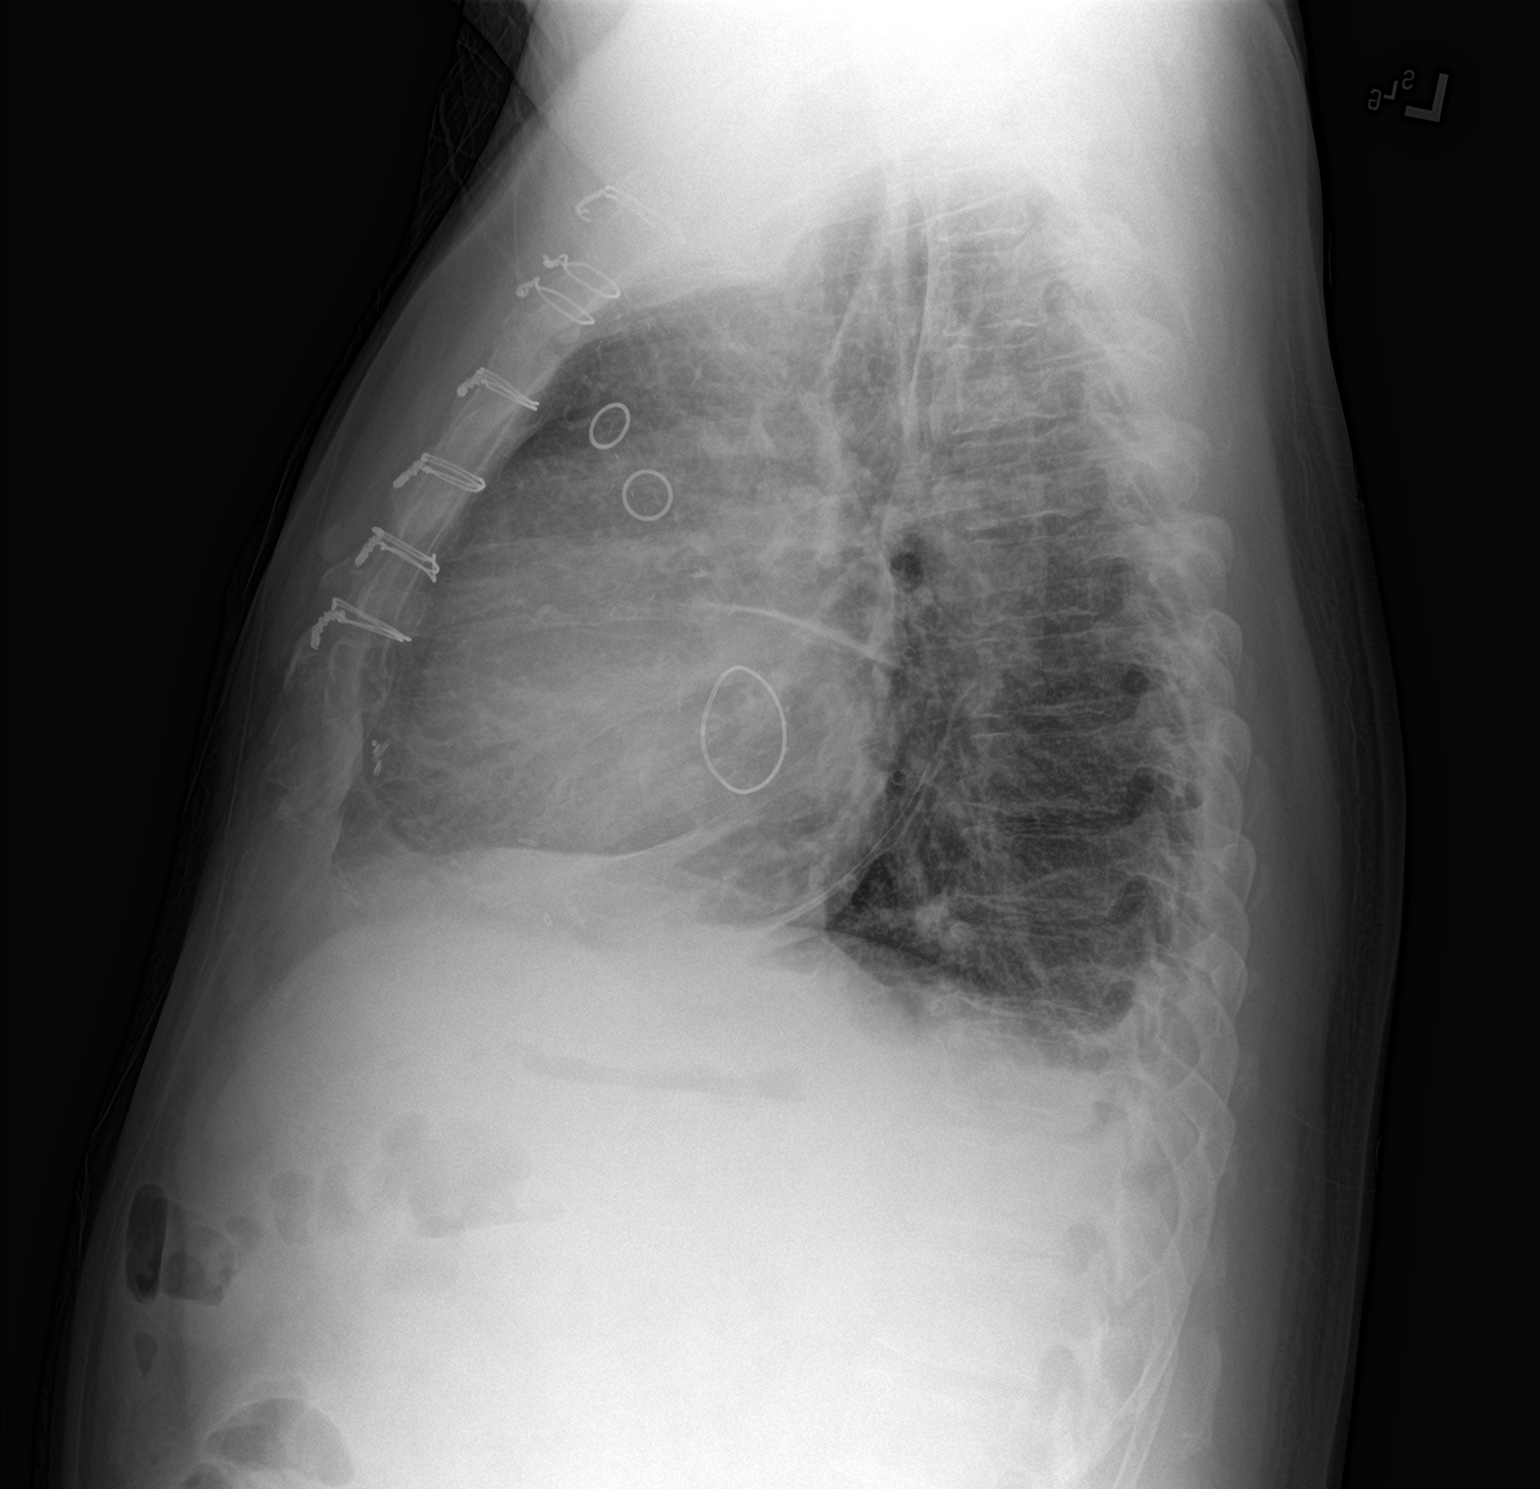

[2 of 2 positions shown; findings below may reference images not displayed]

FINDINGS: There is mild bilateral interstitial thickening. There is no focal
consolidation. There is no pleural effusion or pneumothorax. The
heart and mediastinal contours are unremarkable. There is evidence
of prior CABG.

The osseous structures are unremarkable.
IMPRESSION: Findings concerning for mild interstitial edema. No significant
interval change.

## 2019-05-25 DIAGNOSIS — K2970 Gastritis, unspecified, without bleeding: Secondary | ICD-10-CM | POA: Diagnosis not present

## 2019-05-25 DIAGNOSIS — G629 Polyneuropathy, unspecified: Secondary | ICD-10-CM | POA: Diagnosis not present

## 2019-05-25 DIAGNOSIS — I10 Essential (primary) hypertension: Secondary | ICD-10-CM | POA: Diagnosis not present

## 2019-05-25 DIAGNOSIS — Z0000 Encounter for general adult medical examination without abnormal findings: Secondary | ICD-10-CM | POA: Diagnosis not present

## 2019-05-25 DIAGNOSIS — E119 Type 2 diabetes mellitus without complications: Secondary | ICD-10-CM | POA: Diagnosis not present

## 2019-05-29 DIAGNOSIS — I471 Supraventricular tachycardia: Secondary | ICD-10-CM | POA: Diagnosis not present

## 2019-05-29 DIAGNOSIS — R002 Palpitations: Secondary | ICD-10-CM | POA: Diagnosis not present

## 2019-05-29 DIAGNOSIS — E119 Type 2 diabetes mellitus without complications: Secondary | ICD-10-CM | POA: Diagnosis not present

## 2019-05-29 DIAGNOSIS — I10 Essential (primary) hypertension: Secondary | ICD-10-CM | POA: Diagnosis not present

## 2019-05-29 DIAGNOSIS — G629 Polyneuropathy, unspecified: Secondary | ICD-10-CM | POA: Diagnosis not present

## 2019-05-31 ENCOUNTER — Other Ambulatory Visit: Payer: Self-pay

## 2019-05-31 ENCOUNTER — Ambulatory Visit (INDEPENDENT_AMBULATORY_CARE_PROVIDER_SITE_OTHER): Payer: BC Managed Care – PPO | Admitting: Gastroenterology

## 2019-05-31 ENCOUNTER — Encounter: Payer: Self-pay | Admitting: Gastroenterology

## 2019-05-31 VITALS — BP 125/84 | HR 93 | Temp 98.0°F | Ht 68.0 in | Wt 228.2 lb

## 2019-05-31 DIAGNOSIS — Z1211 Encounter for screening for malignant neoplasm of colon: Secondary | ICD-10-CM

## 2019-05-31 DIAGNOSIS — R140 Abdominal distension (gaseous): Secondary | ICD-10-CM

## 2019-05-31 DIAGNOSIS — K5900 Constipation, unspecified: Secondary | ICD-10-CM

## 2019-05-31 DIAGNOSIS — I2510 Atherosclerotic heart disease of native coronary artery without angina pectoris: Secondary | ICD-10-CM | POA: Insufficient documentation

## 2019-05-31 MED ORDER — POLYETHYLENE GLYCOL 3350 17 G PO PACK: 17.0000 g | PACK | Freq: Every day | ORAL | 0 refills | Status: DC

## 2019-05-31 MED ORDER — NA SULFATE-K SULFATE-MG SULF 17.5-3.13-1.6 GM/177ML PO SOLN: 354.0000 mL | Freq: Once | ORAL | 0 refills | Status: AC

## 2019-05-31 MED ORDER — BISACODYL EC 5 MG PO TBEC: DELAYED_RELEASE_TABLET | ORAL | 0 refills | Status: DC

## 2019-05-31 NOTE — Progress Notes (Signed)
Ricardo Brown [address]  [place], [place] [address]  Main: [phone]  Fax: [phone]   Gastroenterology Consultation  Referring Provider:     [name], MD Primary Care Physician:  [name], MD Reason for Consultation:     Abdominal distention and bloating        HPI:    Chief Complaint  Patient presents with  . New Patient (Initial Visit)  . Abdominal Pain    Patient has been having epigastric pain off and on. He states he has had some nausea and diarrhea from 0-4 times a day     Ricardo Brown is a 58 y.o. y/o male referred for consultation & management  by Dr. [name], MD.  Patient reports over 1 year history of intermittent abdominal distention and bloating, worsening over the last 2 months.  Also reports alternating constipation and diarrhea.  Goes days without a bowel movement and then will have loose bowel movements after that.  No blood in stool.  No weight loss.  Also reports some nausea when abdominal distention occurs.  Usually occurs after eating.  No heartburn or dysphagia.  No weight loss.  No family history of colon cancer.  No prior EGD or colonoscopy.  Past Medical History:  Diagnosis Date  . Diabetes mellitus without complication ([place])    a. Dx ~ 2000.  [name] Heart palpitations    a. Pt reports prior nl echo's and stress tests. Last stress test w/in past 2 yrs - PCP.  [name] High cholesterol     Past Surgical History:  Procedure Laterality Date  . LEFT HEART CATH AND CORONARY ANGIOGRAPHY N/A 01/24/2018   Procedure: LEFT HEART CATH AND CORONARY ANGIOGRAPHY;  Surgeon: [name], MD;  Location: [facility];  Service: Cardiovascular;  Laterality: N/A;    Prior to Admission medications   Medication Sig Start Date End Date Taking? Authorizing Provider  amiodarone (PACERONE) 200 MG tablet SMARTSIG:.5 Tablet(s) By Mouth Daily 05/21/19  Yes [provider]  Dulaglutide 1.5 MG/0.5ML SOPN Inject  into the skin. 03/30/18  Yes [provider]  Fenofibrate 120 MG TABS Take by mouth.   Yes [provider]  furosemide (LASIX) 20 MG tablet Take 40 mg by mouth 2 (two) times daily. 05/08/19  Yes [provider]  gabapentin (NEURONTIN) 100 MG capsule Take 100 mg by mouth 3 (three) times daily as needed. 04/16/19  Yes [provider]  Insulin Syringe-Needle U-100 (BD VEO INSULIN SYRINGE U/F) 31G X 15/64" 0.5 ML MISC Use 1 each 2 (two) times daily 02/04/18  Yes [provider]  loratadine (CLARITIN) 10 MG tablet Take by mouth.   Yes [provider]  losartan (COZAAR) 50 MG tablet Take 50 mg by mouth daily. 05/07/19  Yes [provider]  metFORMIN (GLUCOPHAGE) 500 MG tablet Take 1,000 mg by mouth 2 (two) times daily. 05/14/19  Yes [provider]  metoprolol succinate (TOPROL-XL) 25 MG 24 hr tablet TAKE 1 2 (ONE HALF) TABLET BY MOUTH ONCE DAILY 02/04/18  Yes [provider]  Omega-3 Fatty Acids (FISH OIL) 875 MG CAPS Take 1 capsule by mouth 2 (two) times daily.   Yes [provider]  rosuvastatin (CRESTOR) 5 MG tablet Take by mouth.   Yes [provider]  spironolactone (ALDACTONE) 25 MG tablet SMARTSIG:.5 Tablet(s) By Mouth Daily 05/21/19  Yes [provider]  bisacodyl (BISACODYL) 5 MG EC tablet Please take 2 tablets between  1pm and 3pm the day before procedure 05/31/19   [name], MD  Na Sulfate-K Sulfate-Mg Sulf 17.5-3.13-1.6 GM/177ML SOLN Take 354 mLs by mouth once for 1 dose. 05/31/19 05/31/19  [name], MD  polyethylene glycol (MIRALAX) 17 g packet Take 17 g by mouth daily. 05/31/19 06/30/19  [name], MD    Family History  Problem Relation Age of Onset  . Cancer Mother        died @ 56  . CAD Father        First MI @ 70. S/p heart transplant. Died in mid-50's of cancer.  . Cancer Father   . Other Brother        alive and well     Social History    Tobacco Use  . Smoking status: Never Smoker  . Smokeless tobacco: Never Used  Substance Use Topics  . Alcohol use: Yes    Comment: rare beer  . Drug use: No    Allergies as of 05/31/2019 - Review Complete 05/31/2019  Allergen Reaction Noted  . Lipitor [atorvastatin] Palpitations 04/23/2015    Review of Systems:    All systems reviewed and negative except where noted in HPI.   Physical Exam:  BP 125/84 (BP Location: Left Arm, Patient Position: Sitting, Cuff Size: Normal)   Pulse 93   Temp 98 F (36.7 C) (Oral)   Ht 5\' 8"  (1.727 m)   Wt 228 lb 4 oz (103.5 kg)   BMI 34.71 kg/m  No LMP for male patient. Psych:  Alert and cooperative. Normal mood and affect. General:   Alert,  Well-developed, well-nourished, pleasant and cooperative in NAD Head:  Normocephalic and atraumatic. Eyes:  Sclera clear, no icterus.   Conjunctiva pink. Ears:  Normal auditory acuity. Nose:  No deformity, discharge, or lesions. Mouth:  No deformity or lesions,oropharynx pink & moist. Neck:  Supple; no masses or thyromegaly. Abdomen:  Normal bowel sounds.  No bruits.  Soft, non-tender and non-distended without masses, hepatosplenomegaly or hernias noted.  No guarding or rebound tenderness.    Msk:  Symmetrical without gross deformities. Good, equal movement & strength bilaterally. Pulses:  Normal pulses noted. Extremities:  No clubbing or edema.  No cyanosis. Neurologic:  Alert and oriented x3;  grossly normal neurologically. Skin:  Intact without significant lesions or rashes. No jaundice. Lymph Nodes:  No significant cervical adenopathy. Psych:  Alert and cooperative. Normal mood and affect.   Labs: CBC    Component Value Date/Time   WBC 5.4 11/03/2018 1318   RBC 4.38 11/03/2018 1318   HGB 12.9 (L) 11/03/2018 1318   HCT 39.7 11/03/2018 1318   PLT 162 11/03/2018 1318   MCV 90.6 11/03/2018 1318   MCH 29.5 11/03/2018 1318   MCHC 32.5 11/03/2018 1318   RDW 13.9 11/03/2018 1318   LYMPHSABS 1.9  04/23/2015 1243   MONOABS 0.8 04/23/2015 1243   EOSABS 0.1 04/23/2015 1243   BASOSABS 0.1 04/23/2015 1243   CMP     Component Value Date/Time   NA 137 11/03/2018 1318   K 3.9 11/03/2018 1318   CL 102 11/03/2018 1318   CO2 27 11/03/2018 1318   GLUCOSE 205 (H) 01/24/2018 1108   BUN 52 (H) 01/24/2018 1108   CREATININE 1.82 (H) 01/24/2018 1108   CALCIUM 8.7 (L) 01/24/2018 1108   PROT 7.8 01/24/2018 1108   ALBUMIN 3.8 01/24/2018 1108   AST 40 01/24/2018 1108   ALT 30 01/24/2018 1108   ALKPHOS 29 (L) 01/24/2018  1108   BILITOT 1.1 01/24/2018 1108   GFRNONAA 40 (L) 01/24/2018 1108   GFRAA 46 (L) 01/24/2018 1108    Imaging Studies: No results found.  Assessment and Plan:   Ricardo Brown is a 58 y.o. y/o male has been referred for alternating constipation and diarrhea, and abdominal distention which has worsened over the last 2 months  I suspect his abdominal distention and bloating is due to constipation. He goes days without having a bowel movement and then will have loose stools.  Likely postobstructive diarrhea.  High-fiber diet MiraLAX daily with goal of 1-2 soft bowel movements daily.  If not at goal, patient instructed to increase dose to twice daily.  If loose stools with the medication, patient asked to decrease the medication to every other day, or half dose daily.  Patient verbalized understanding  He does not have any upper GI symptoms at this time  He is due for screening colonoscopy and is willing to schedule  We also discussed of an upper endoscopy due to his intermittent abdominal bloating.  However, due to lack of upper GI symptoms, this may be low yield and patient does not want to schedule it after discussing risks and benefits of the upper endoscopy.  If symptoms do not get better with above management he is willing to consider upper endoscopy at that time  I have discussed alternative options, risks & benefits,  which include, but are not limited to,  bleeding, infection, perforation,respiratory complication & drug reaction.  The patient agrees with this plan & written consent will be obtained.      Dr Ricardo Brown  Speech recognition software was used to dictate the above note.

## 2019-05-31 NOTE — Patient Instructions (Signed)
Please take Miralax daily  High-Fiber Diet Fiber, also called dietary fiber, is a type of carbohydrate that is found in fruits, vegetables, whole grains, and beans. A high-fiber diet can have many health benefits. Your health care provider may recommend a high-fiber diet to help:  Prevent constipation. Fiber can make your bowel movements more regular.  Lower your cholesterol.  Relieve the following conditions: ? Swelling of veins in the anus (hemorrhoids). ? Swelling and irritation (inflammation) of specific areas of the digestive tract (uncomplicated diverticulosis). ? A problem of the large intestine (colon) that sometimes causes pain and diarrhea (irritable bowel syndrome, IBS).  Prevent overeating as part of a weight-loss plan.  Prevent heart disease, type 2 diabetes, and certain cancers. What is my plan? The recommended daily fiber intake in grams (g) includes:  38 g for men age 50 or younger.  30 g for men over age 50.  25 g for women age 50 or younger.  21 g for women over age 50. You can get the recommended daily intake of dietary fiber by:  Eating a variety of fruits, vegetables, grains, and beans.  Taking a fiber supplement, if it is not possible to get enough fiber through your diet. What do I need to know about a high-fiber diet?  It is better to get fiber through food sources rather than from fiber supplements. There is not a lot of research about how effective supplements are.  Always check the fiber content on the nutrition facts label of any prepackaged food. Look for foods that contain 5 g of fiber or more per serving.  Talk with a diet and nutrition specialist (dietitian) if you have questions about specific foods that are recommended or not recommended for your medical condition, especially if those foods are not listed below.  Gradually increase how much fiber you consume. If you increase your intake of dietary fiber too quickly, you may have bloating,  cramping, or gas.  Drink plenty of water. Water helps you to digest fiber. What are tips for following this plan?  Eat a wide variety of high-fiber foods.  Make sure that half of the grains that you eat each day are whole grains.  Eat breads and cereals that are made with whole-grain flour instead of refined flour or white flour.  Eat brown rice, bulgur wheat, or millet instead of white rice.  Start the day with a breakfast that is high in fiber, such as a cereal that contains 5 g of fiber or more per serving.  Use beans in place of meat in soups, salads, and pasta dishes.  Eat high-fiber snacks, such as berries, raw vegetables, nuts, and popcorn.  Choose whole fruits and vegetables instead of processed forms like juice or sauce. What foods can I eat?  Fruits Berries. Pears. Apples. Oranges. Avocado. Prunes and raisins. Dried figs. Vegetables Sweet potatoes. Spinach. Kale. Artichokes. Cabbage. Broccoli. Cauliflower. Green peas. Carrots. Squash. Grains Whole-grain breads. Multigrain cereal. Oats and oatmeal. Brown rice. Barley. Bulgur wheat. Millet. Quinoa. Bran muffins. Popcorn. Rye wafer crackers. Meats and other proteins Navy, kidney, and pinto beans. Soybeans. Split peas. Lentils. Nuts and seeds. Dairy Fiber-fortified yogurt. Beverages Fiber-fortified soy milk. Fiber-fortified orange juice. Other foods Fiber bars. The items listed above may not be a complete list of recommended foods and beverages. Contact a dietitian for more options. What foods are not recommended? Fruits Fruit juice. Cooked, strained fruit. Vegetables Fried potatoes. Canned vegetables. Well-cooked vegetables. Grains White bread. Pasta made with refined flour.   White rice. Meats and other proteins Fatty cuts of meat. Fried chicken or fried fish. Dairy Milk. Yogurt. Cream cheese. Sour cream. Fats and oils Butters. Beverages Soft drinks. Other foods Cakes and pastries. The items listed above  may not be a complete list of foods and beverages to avoid. Contact a dietitian for more information. Summary  Fiber is a type of carbohydrate. It is found in fruits, vegetables, whole grains, and beans.  There are many health benefits of eating a high-fiber diet, such as preventing constipation, lowering blood cholesterol, helping with weight loss, and reducing your risk of heart disease, diabetes, and certain cancers.  Gradually increase your intake of fiber. Increasing too fast can result in cramping, bloating, and gas. Drink plenty of water while you increase your fiber.  The best sources of fiber include whole fruits and vegetables, whole grains, nuts, seeds, and beans. This information is not intended to replace advice given to you by your health care provider. Make sure you discuss any questions you have with your health care provider. Document Released: 07/20/2005 Document Revised: 05/24/2017 Document Reviewed: 05/24/2017 Elsevier Patient Education  2020 Elsevier Inc.  

## 2019-06-01 ENCOUNTER — Telehealth: Payer: Self-pay

## 2019-06-01 DIAGNOSIS — I700 Atherosclerosis of aorta: Secondary | ICD-10-CM | POA: Diagnosis not present

## 2019-06-01 DIAGNOSIS — R002 Palpitations: Secondary | ICD-10-CM | POA: Diagnosis not present

## 2019-06-01 DIAGNOSIS — I10 Essential (primary) hypertension: Secondary | ICD-10-CM | POA: Diagnosis not present

## 2019-06-01 DIAGNOSIS — E119 Type 2 diabetes mellitus without complications: Secondary | ICD-10-CM | POA: Diagnosis not present

## 2019-06-01 DIAGNOSIS — G629 Polyneuropathy, unspecified: Secondary | ICD-10-CM | POA: Diagnosis not present

## 2019-06-01 NOTE — Telephone Encounter (Signed)
Called patient to confirm patient procedure appointment and to remind patient that they have to go for a COVID test on 06/02/2019. Called patient and left a detail message and sent mychart message

## 2019-06-02 ENCOUNTER — Other Ambulatory Visit: Payer: Self-pay

## 2019-06-02 ENCOUNTER — Other Ambulatory Visit
Admission: RE | Admit: 2019-06-02 | Discharge: 2019-06-02 | Disposition: A | Discharge status: Home / Self Care | Payer: BC Managed Care – PPO | Source: Ambulatory Visit | Attending: Gastroenterology | Admitting: Gastroenterology

## 2019-06-02 DIAGNOSIS — Z20828 Contact with and (suspected) exposure to other viral communicable diseases: Secondary | ICD-10-CM | POA: Insufficient documentation

## 2019-06-02 DIAGNOSIS — Z01812 Encounter for preprocedural laboratory examination: Secondary | ICD-10-CM | POA: Insufficient documentation

## 2019-06-02 LAB — SARS CORONAVIRUS 2 (TAT 6-24 HRS): SARS Coronavirus 2: NEGATIVE

## 2019-06-07 ENCOUNTER — Ambulatory Visit
Admission: RE | Admit: 2019-06-07 | Discharge: 2019-06-07 | Disposition: A | Discharge status: Home / Self Care | Payer: BC Managed Care – PPO | Attending: Gastroenterology | Admitting: Gastroenterology

## 2019-06-07 ENCOUNTER — Ambulatory Visit: Payer: BC Managed Care – PPO | Admitting: Anesthesiology

## 2019-06-07 ENCOUNTER — Other Ambulatory Visit: Payer: Self-pay

## 2019-06-07 ENCOUNTER — Encounter: Payer: Self-pay | Admitting: Anesthesiology

## 2019-06-07 ENCOUNTER — Encounter
Admission: RE | Disposition: A | Discharge status: Home / Self Care | Payer: Self-pay | Source: Home / Self Care | Attending: Gastroenterology

## 2019-06-07 DIAGNOSIS — D123 Benign neoplasm of transverse colon: Secondary | ICD-10-CM | POA: Insufficient documentation

## 2019-06-07 DIAGNOSIS — I252 Old myocardial infarction: Secondary | ICD-10-CM | POA: Insufficient documentation

## 2019-06-07 DIAGNOSIS — Z888 Allergy status to other drugs, medicaments and biological substances status: Secondary | ICD-10-CM | POA: Diagnosis not present

## 2019-06-07 DIAGNOSIS — Z8249 Family history of ischemic heart disease and other diseases of the circulatory system: Secondary | ICD-10-CM | POA: Diagnosis not present

## 2019-06-07 DIAGNOSIS — E7800 Pure hypercholesterolemia, unspecified: Secondary | ICD-10-CM | POA: Diagnosis not present

## 2019-06-07 DIAGNOSIS — I2510 Atherosclerotic heart disease of native coronary artery without angina pectoris: Secondary | ICD-10-CM | POA: Diagnosis not present

## 2019-06-07 DIAGNOSIS — Z79899 Other long term (current) drug therapy: Secondary | ICD-10-CM | POA: Diagnosis not present

## 2019-06-07 DIAGNOSIS — Z1211 Encounter for screening for malignant neoplasm of colon: Secondary | ICD-10-CM | POA: Diagnosis not present

## 2019-06-07 DIAGNOSIS — Z951 Presence of aortocoronary bypass graft: Secondary | ICD-10-CM | POA: Insufficient documentation

## 2019-06-07 DIAGNOSIS — K635 Polyp of colon: Secondary | ICD-10-CM | POA: Diagnosis not present

## 2019-06-07 DIAGNOSIS — I110 Hypertensive heart disease with heart failure: Secondary | ICD-10-CM | POA: Diagnosis not present

## 2019-06-07 DIAGNOSIS — E119 Type 2 diabetes mellitus without complications: Secondary | ICD-10-CM | POA: Diagnosis not present

## 2019-06-07 DIAGNOSIS — D124 Benign neoplasm of descending colon: Secondary | ICD-10-CM | POA: Diagnosis not present

## 2019-06-07 DIAGNOSIS — Z7984 Long term (current) use of oral hypoglycemic drugs: Secondary | ICD-10-CM | POA: Insufficient documentation

## 2019-06-07 DIAGNOSIS — Z952 Presence of prosthetic heart valve: Secondary | ICD-10-CM | POA: Diagnosis not present

## 2019-06-07 DIAGNOSIS — R002 Palpitations: Secondary | ICD-10-CM | POA: Insufficient documentation

## 2019-06-07 DIAGNOSIS — I509 Heart failure, unspecified: Secondary | ICD-10-CM | POA: Insufficient documentation

## 2019-06-07 HISTORY — DX: Atherosclerotic heart disease of native coronary artery without angina pectoris: I25.10

## 2019-06-07 HISTORY — DX: Essential (primary) hypertension: I10

## 2019-06-07 HISTORY — DX: Heart failure, unspecified: I50.9

## 2019-06-07 HISTORY — PX: COLONOSCOPY WITH PROPOFOL: SHX5780

## 2019-06-07 HISTORY — DX: Acute myocardial infarction, unspecified: I21.9

## 2019-06-07 LAB — GLUCOSE, CAPILLARY: Glucose-Capillary: 149 mg/dL — ABNORMAL HIGH (ref 70–99)

## 2019-06-07 SURGERY — COLONOSCOPY WITH PROPOFOL
Anesthesia: General

## 2019-06-07 MED ORDER — PROPOFOL 500 MG/50ML IV EMUL
INTRAVENOUS | Status: AC
  Filled 2019-06-07: qty 50

## 2019-06-07 MED ORDER — PROPOFOL 10 MG/ML IV BOLUS
INTRAVENOUS | Status: DC | PRN
  Administered 2019-06-07: 60 mg via INTRAVENOUS

## 2019-06-07 MED ORDER — SODIUM CHLORIDE 0.9 % IV SOLN
INTRAVENOUS | Status: AC | PRN
  Administered 2019-06-07: 18 mL via INTRAMUSCULAR

## 2019-06-07 MED ORDER — PROPOFOL 10 MG/ML IV BOLUS
INTRAVENOUS | Status: AC
  Filled 2019-06-07: qty 20

## 2019-06-07 MED ORDER — SPOT INK MARKER SYRINGE KIT
PACK | SUBMUCOSAL | Status: DC | PRN
  Administered 2019-06-07: 4.5 mL via SUBMUCOSAL

## 2019-06-07 MED ORDER — PROPOFOL 500 MG/50ML IV EMUL
INTRAVENOUS | Status: DC | PRN
  Administered 2019-06-07: 175 ug/kg/min via INTRAVENOUS

## 2019-06-07 MED ORDER — PHENYLEPHRINE HCL (PRESSORS) 10 MG/ML IV SOLN
INTRAVENOUS | Status: DC | PRN
  Administered 2019-06-07 (×3): 100 ug via INTRAVENOUS
  Administered 2019-06-07: 50 ug via INTRAVENOUS
  Administered 2019-06-07: 100 ug via INTRAVENOUS

## 2019-06-07 MED ORDER — SODIUM CHLORIDE 0.9 % IV SOLN
INTRAVENOUS | Status: DC
  Administered 2019-06-07: 10:00:00 via INTRAVENOUS

## 2019-06-07 MED ORDER — ONDANSETRON HCL 4 MG/2ML IJ SOLN: INTRAMUSCULAR | Status: DC | PRN

## 2019-06-07 MED ORDER — LIDOCAINE HCL (CARDIAC) PF 100 MG/5ML IV SOSY
PREFILLED_SYRINGE | INTRAVENOUS | Status: DC | PRN
  Administered 2019-06-07: 50 mg via INTRAVENOUS

## 2019-06-07 NOTE — Op Note (Signed)
[facility] Patient Name: Ricardo Brown Procedure Date: 06/07/2019 10:17 AM MRN: [id number] Account #: [id number] Date of Birth: 1961-02-25 Admit Type: Outpatient Age: 58 Room: [facility] ENDO ROOM 4 Gender: Male Note Status: Finalized Procedure:             Colonoscopy Indications:           Screening for colorectal malignant neoplasm Providers:             Quaneshia Wareing B. Bonna Gains MD, MD Referring MD:          [name], MD (Referring MD) Medicines:             Monitored Anesthesia Care Complications:         No immediate complications. Procedure:             Pre-Anesthesia Assessment:                        - ASA Grade Assessment: II - A patient with mild                         systemic disease.                        - Prior to the procedure, a History and Physical was                         performed, and patient medications, allergies and                         sensitivities were reviewed. The patient's tolerance                         of previous anesthesia was reviewed.                        - The risks and benefits of the procedure and the                         sedation options and risks were discussed with the                         patient. All questions were answered and informed                         consent was obtained.                        - Patient identification and proposed procedure were                         verified prior to the procedure by the physician, the                         nurse, the anesthesiologist, the anesthetist and the                         technician. The procedure was verified in the                         procedure room.  After obtaining informed consent, the colonoscope was                         passed under direct vision. Throughout the procedure,                         the patient's blood pressure, pulse, and oxygen                         saturations were  monitored continuously. The                         Colonoscope was introduced through the anus and                         advanced to the the cecum, identified by appendiceal                         orifice and ileocecal valve. The colonoscopy was                         performed with ease. The patient tolerated the                         procedure well. The quality of the bowel preparation                         was good. Findings:      The perianal and digital rectal examinations were normal.      A 15 mm polyp was found in the transverse colon. The polyp was       multi-lobulated and sessile. Area was successfully injected with       [Volume] saline for lesion assessment, and this injection appeared to       lift the lesion adequately. Polypectomy was attempted, initially using a       cold snare. Polyp resection was incomplete with this device. This       intervention then required a different device and polypectomy technique.       The polyp was removed with a hot snare. Resection and retrieval were       complete. Area was tattooed with an injection of Spot (carbon black).      Four sessile polyps were found in the descending colon. The polyps were       7 to 10 mm in size. These polyps were removed with a hot snare.       Resection and retrieval were complete.      A 5 mm polyp was found in the descending colon. The polyp was sessile.       The polyp was removed with a cold snare. Resection and retrieval were       complete.      The exam was otherwise without abnormality.      The rectum, sigmoid colon, descending colon, transverse colon, ascending       colon and cecum appeared normal. Biopsies for histology were taken with       a cold forceps from the right colon for evaluation of microscopic       colitis.      The retroflexed view of the distal rectum and anal verge was  normal and       showed no anal or rectal abnormalities. Impression:            - One 15 mm polyp in  the transverse colon, removed                         with a hot snare. Resected and retrieved. Injected.                         Tattooed.                        - Four 7 to 10 mm polyps in the descending colon,                         removed with a hot snare. Resected and retrieved.                        - One 5 mm polyp in the descending colon, removed with                         a cold snare. Resected and retrieved.                        - The examination was otherwise normal.                        - The rectum, sigmoid colon, descending colon,                         transverse colon, ascending colon and cecum are                         normal. Biopsied.                        - The distal rectum and anal verge are normal on                         retroflexion view. Recommendation:        - Discharge patient to home (with escort).                        - Advance diet as tolerated.                        - Continue present medications.                        - Await pathology results.                        - Repeat colonoscopy in 6 months, to reassess                         piecemeal polypectomy site in the transverse colon.                        - The findings and recommendations were discussed with  the patient.                        - The findings and recommendations were discussed with                         the patient's family.                        - Return to primary care physician as previously                         scheduled. Procedure Code(s):     --- Professional ---                        [id number], Colonoscopy, flexible; with removal of                         tumor(s), polyp(s), or other lesion(s) by snare                         technique                        [id number], Colonoscopy, flexible; with directed submucosal                         injection(s), any substance                        [id number], 62, Colonoscopy, flexible; with  biopsy, single                         or multiple Diagnosis Code(s):     --- Professional ---                        K63.5, Polyp of colon                        Z12.11, Encounter for screening for malignant neoplasm                         of colon CPT copyright 2019 American Medical Association. All rights reserved. The codes documented in this report are preliminary and upon coder review may  be revised to meet current compliance requirements.  [name], MD Margretta Sidle B. Bonna Gains MD, MD 06/07/2019 11:34:07 AM This report has been signed electronically. Number of Addenda: 0 Note Initiated On: 06/07/2019 10:17 AM Scope Withdrawal Time: 0 hours 50 minutes 21 seconds  Total Procedure Duration: 0 hours 57 minutes 46 seconds  Estimated Blood Loss:  Estimated blood loss: none.      [facility]

## 2019-06-07 NOTE — Anesthesia Preprocedure Evaluation (Addendum)
Anesthesia Evaluation  Patient identified by MRN, date of birth, ID band Patient awake    Reviewed: Allergy & Precautions, NPO status , Patient's Chart, lab work & pertinent test results, reviewed documented beta blocker date and time   Airway Mallampati: II       Dental  (+) Upper Dentures, Lower Dentures   Pulmonary neg pulmonary ROS,    Pulmonary exam normal        Cardiovascular hypertension, Pt. on medications and Pt. on home beta blockers + CAD, + Past MI and +CHF  Normal cardiovascular exam     Neuro/Psych negative neurological ROS  negative psych ROS   GI/Hepatic negative GI ROS, Neg liver ROS,   Endo/Other  diabetes  Renal/GU negative Renal ROS  negative genitourinary   Musculoskeletal negative musculoskeletal ROS (+)   Abdominal Normal abdominal exam  (+)   Peds negative pediatric ROS (+)  Hematology negative hematology ROS (+)   Anesthesia Other Findings Past Medical History: No date: Diabetes mellitus without complication (HCC)     Comment:  a. Dx ~ 2000. No date: Heart palpitations     Comment:  a. Pt reports prior nl echo's and stress tests. Last               stress test w/in past 2 yrs - PCP. No date: High cholesterol EF 35-40%  Reproductive/Obstetrics                             Anesthesia Physical  Anesthesia Plan  ASA: III  Anesthesia Plan: General   Post-op Pain Management:    Induction: Intravenous  PONV Risk Score and Plan: TIVA  Airway Management Planned: Nasal Cannula  Additional Equipment:   Intra-op Plan:   Post-operative Plan:   Informed Consent: I have reviewed the patients History and Physical, chart, labs and discussed the procedure including the risks, benefits and alternatives for the proposed anesthesia with the patient or authorized representative who has indicated his/her understanding and acceptance.     Dental advisory  given  Plan Discussed with: CRNA and Surgeon  Anesthesia Plan Comments:         Anesthesia Quick Evaluation  

## 2019-06-07 NOTE — Anesthesia Postprocedure Evaluation (Signed)
Anesthesia Post Note  Patient: Ricardo Brown  Procedure(s) Performed: COLONOSCOPY WITH PROPOFOL (N/A )  Patient location during evaluation: PACU Anesthesia Type: General Level of consciousness: awake and alert and oriented Pain management: pain level controlled Vital Signs Assessment: post-procedure vital signs reviewed and stable Respiratory status: spontaneous breathing Cardiovascular status: blood pressure returned to baseline Anesthetic complications: no     Last Vitals:  Vitals:   06/07/19 1139 06/07/19 1154  BP: 106/64 108/68  Pulse: 82   Resp:    Temp:    SpO2: 98% 99%    Last Pain:  Vitals:   06/07/19 1139  TempSrc:   PainSc: 0-No pain                 Elonzo Sopp

## 2019-06-07 NOTE — Transfer of Care (Signed)
Immediate Anesthesia Transfer of Care Note  Patient: Ricardo Brown  Procedure(s) Performed: COLONOSCOPY WITH PROPOFOL (N/A )  Patient Location: PACU  Anesthesia Type:General  Level of Consciousness: awake and drowsy  Airway & Oxygen Therapy: Patient Spontanous Breathing  Post-op Assessment: Report given to RN and Post -op Vital signs reviewed and stable  Post vital signs: Reviewed and stable  Last Vitals:  Vitals Value Taken Time  BP 103/59 06/07/19 1129  Temp 36.4 C 06/07/19 1129  Pulse 68 06/07/19 1130  Resp 14 06/07/19 1130  SpO2 100 % 06/07/19 1130  Vitals shown include unvalidated device data.  Last Pain:  Vitals:   06/07/19 1129  TempSrc: Temporal  PainSc: 0-No pain         Complications: No apparent anesthesia complications

## 2019-06-07 NOTE — Anesthesia Post-op Follow-up Note (Signed)
Anesthesia QCDR form completed.        

## 2019-06-07 NOTE — Anesthesia Procedure Notes (Signed)
Date/Time: 06/07/2019 10:18 AM Performed by: Johnna Acosta, CRNA Pre-anesthesia Checklist: Patient identified, Emergency Drugs available, Suction available, Patient being monitored and Timeout performed Patient Re-evaluated:Patient Re-evaluated prior to induction Oxygen Delivery Method: Nasal cannula Preoxygenation: Pre-oxygenation with 100% oxygen Induction Type: IV induction

## 2019-06-07 NOTE — H&P (Signed)
[name], MD [address], [place], [place], [place], [address] [address], [place], [place], [place], [address] Phone: [phone]  Fax: [phone]  Primary Care Physician:  [name], MD   Pre-Procedure History & Physical: HPI:  Ricardo Brown is a 58 y.o. male is here for a colonoscopy.   Past Medical History:  Diagnosis Date  . CHF (congestive heart failure) ([place])   . Coronary artery disease   . Diabetes mellitus without complication ([place])    a. Dx ~ 2000.  [name] Heart palpitations    a. Pt reports prior nl echo's and stress tests. Last stress test w/in past 2 yrs - PCP.  [name] High cholesterol   . Hypertension   . Myocardial infarction [facility])     Past Surgical History:  Procedure Laterality Date  . CARDIAC CATHETERIZATION    . CARDIAC VALVE REPLACEMENT     Mitral Valve Repair  . CORONARY ARTERY BYPASS GRAFT     x 3   01/2018  . LEFT HEART CATH AND CORONARY ANGIOGRAPHY N/A 01/24/2018   Procedure: LEFT HEART CATH AND CORONARY ANGIOGRAPHY;  Surgeon: [name], MD;  Location: [facility];  Service: Cardiovascular;  Laterality: N/A;  . TONSILLECTOMY      Prior to Admission medications   Medication Sig Start Date End Date Taking? Authorizing Provider  amiodarone (PACERONE) 200 MG tablet SMARTSIG:.5 Tablet(s) By Mouth Daily 05/21/19  Yes [provider]  furosemide (LASIX) 20 MG tablet Take 40 mg by mouth 2 (two) times daily. 05/08/19  Yes [provider]  gabapentin (NEURONTIN) 100 MG capsule Take 100 mg by mouth 3 (three) times daily as needed. 04/16/19  Yes [provider]  loratadine (CLARITIN) 10 MG tablet Take by mouth.   Yes [provider]  losartan (COZAAR) 50 MG tablet Take 50 mg by mouth daily. 05/07/19  Yes [provider]  metFORMIN (GLUCOPHAGE) 500 MG tablet Take 1,000 mg by mouth 2 (two) times daily. 05/14/19  Yes [provider]  metoprolol succinate (TOPROL-XL) 25 MG  24 hr tablet TAKE 1 2 (ONE HALF) TABLET BY MOUTH ONCE DAILY 02/04/18  Yes [provider]  Omega-3 Fatty Acids (FISH OIL) 875 MG CAPS Take 1 capsule by mouth 2 (two) times daily.   Yes [provider]  rosuvastatin (CRESTOR) 5 MG tablet Take by mouth.   Yes [provider]  spironolactone (ALDACTONE) 25 MG tablet SMARTSIG:.5 Tablet(s) By Mouth Daily 05/21/19  Yes [provider]  Dulaglutide 1.5 MG/0.5ML SOPN Inject into the skin. 03/30/18   [provider]    Allergies as of 05/31/2019 - Review Complete 05/31/2019  Allergen Reaction Noted  . Lipitor [atorvastatin] Palpitations 04/23/2015    Family History  Problem Relation Age of Onset  . Cancer Mother        died @ 79  . CAD Father        First MI @ 72. S/p heart transplant. Died in mid-50's of cancer.  . Cancer Father   . Other Brother        alive and well    Social History   Socioeconomic History  . Marital status: Married    Spouse name: Not on file  . Number of children: Not on file  . Years of education: Not on file  . Highest education level: Not on file  Occupational History  . Not on file  Social Needs  . Financial resource strain: Not on file  .  Food insecurity    Worry: Not on file    Inability: Not on file  . Transportation needs    Medical: Not on file    Non-medical: Not on file  Tobacco Use  . Smoking status: Never Smoker  . Smokeless tobacco: Never Used  Substance and Sexual Activity  . Alcohol use: Yes    Comment: rare beer  . Drug use: No  . Sexual activity: Not on file  Lifestyle  . Physical activity    Days per week: Not on file    Minutes per session: Not on file  . Stress: Not on file  Relationships  . Social Herbalist on phone: Not on file    Gets together: Not on file    Attends religious service: Not on file    Active member of club or organization: Not on file    Attends meetings of clubs or organizations: Not on file     Relationship status: Not on file  . Intimate partner violence    Fear of current or ex partner: Not on file    Emotionally abused: Not on file    Physically abused: Not on file    Forced sexual activity: Not on file  Other Topics Concern  . Not on file  Social History Narrative   Lives locally with wife.  Works in [employer].  Does not routinely exercise.    Review of Systems: See HPI, otherwise negative ROS  Physical Exam: BP (!) 143/86   Pulse 87   Temp (!) 97 F (36.1 C) (Temporal)   Resp 14   Ht 5\' 8"  (1.727 m)   Wt 103.5 kg   SpO2 99%   BMI 34.71 kg/m  General:   Alert,  pleasant and cooperative in NAD Head:  Normocephalic and atraumatic. Neck:  Supple; no masses or thyromegaly. Lungs:  Clear throughout to auscultation, normal respiratory effort.    Heart:  +S1, +S2, Regular rate and rhythm, No edema. Abdomen:  Soft, nontender and nondistended. Normal bowel sounds, without guarding, and without rebound.   Neurologic:  Alert and  oriented x4;  grossly normal neurologically.  Impression/Plan: Ricardo Brown is here for a colonoscopy to be performed for average risk screening.  Risks, benefits, limitations, and alternatives regarding  colonoscopy have been reviewed with the patient.  Questions have been answered.  All parties agreeable.   [name], MD  06/07/2019, 10:16 AM

## 2019-06-08 ENCOUNTER — Encounter: Payer: Self-pay | Admitting: Gastroenterology

## 2019-06-08 LAB — SURGICAL PATHOLOGY

## 2019-06-19 DIAGNOSIS — H35372 Puckering of macula, left eye: Secondary | ICD-10-CM | POA: Diagnosis not present

## 2019-06-19 DIAGNOSIS — E113513 Type 2 diabetes mellitus with proliferative diabetic retinopathy with macular edema, bilateral: Secondary | ICD-10-CM | POA: Diagnosis not present

## 2019-06-19 DIAGNOSIS — H3582 Retinal ischemia: Secondary | ICD-10-CM | POA: Diagnosis not present

## 2019-06-22 DIAGNOSIS — Z20828 Contact with and (suspected) exposure to other viral communicable diseases: Secondary | ICD-10-CM | POA: Diagnosis not present

## 2019-06-27 DIAGNOSIS — R2689 Other abnormalities of gait and mobility: Secondary | ICD-10-CM | POA: Diagnosis not present

## 2019-06-27 DIAGNOSIS — G629 Polyneuropathy, unspecified: Secondary | ICD-10-CM | POA: Diagnosis not present

## 2019-07-18 DIAGNOSIS — R200 Anesthesia of skin: Secondary | ICD-10-CM | POA: Diagnosis not present

## 2019-07-21 DIAGNOSIS — R200 Anesthesia of skin: Secondary | ICD-10-CM | POA: Insufficient documentation

## 2019-08-08 DIAGNOSIS — R200 Anesthesia of skin: Secondary | ICD-10-CM | POA: Diagnosis not present

## 2019-08-08 DIAGNOSIS — G629 Polyneuropathy, unspecified: Secondary | ICD-10-CM | POA: Diagnosis not present

## 2019-08-08 DIAGNOSIS — R2689 Other abnormalities of gait and mobility: Secondary | ICD-10-CM | POA: Diagnosis not present

## 2019-08-08 DIAGNOSIS — E1159 Type 2 diabetes mellitus with other circulatory complications: Secondary | ICD-10-CM | POA: Diagnosis not present

## 2019-08-16 DIAGNOSIS — H35372 Puckering of macula, left eye: Secondary | ICD-10-CM | POA: Diagnosis not present

## 2019-08-16 DIAGNOSIS — E113513 Type 2 diabetes mellitus with proliferative diabetic retinopathy with macular edema, bilateral: Secondary | ICD-10-CM | POA: Diagnosis not present

## 2019-08-17 DIAGNOSIS — I10 Essential (primary) hypertension: Secondary | ICD-10-CM | POA: Diagnosis not present

## 2019-08-17 DIAGNOSIS — G629 Polyneuropathy, unspecified: Secondary | ICD-10-CM | POA: Diagnosis not present

## 2019-08-17 DIAGNOSIS — Z794 Long term (current) use of insulin: Secondary | ICD-10-CM | POA: Diagnosis not present

## 2019-08-17 DIAGNOSIS — E1159 Type 2 diabetes mellitus with other circulatory complications: Secondary | ICD-10-CM | POA: Diagnosis not present

## 2019-08-17 DIAGNOSIS — E7800 Pure hypercholesterolemia, unspecified: Secondary | ICD-10-CM | POA: Diagnosis not present

## 2019-08-22 ENCOUNTER — Ambulatory Visit: Payer: BC Managed Care – PPO | Attending: Neurology

## 2019-08-22 DIAGNOSIS — G4761 Periodic limb movement disorder: Secondary | ICD-10-CM | POA: Diagnosis not present

## 2019-08-22 DIAGNOSIS — G4733 Obstructive sleep apnea (adult) (pediatric): Secondary | ICD-10-CM | POA: Diagnosis not present

## 2019-08-22 DIAGNOSIS — G479 Sleep disorder, unspecified: Secondary | ICD-10-CM | POA: Diagnosis not present

## 2019-08-23 ENCOUNTER — Other Ambulatory Visit: Payer: Self-pay

## 2019-08-30 DIAGNOSIS — G4730 Sleep apnea, unspecified: Secondary | ICD-10-CM | POA: Diagnosis not present

## 2019-08-31 DIAGNOSIS — E785 Hyperlipidemia, unspecified: Secondary | ICD-10-CM | POA: Diagnosis not present

## 2019-08-31 DIAGNOSIS — R002 Palpitations: Secondary | ICD-10-CM | POA: Diagnosis not present

## 2019-08-31 DIAGNOSIS — I10 Essential (primary) hypertension: Secondary | ICD-10-CM | POA: Diagnosis not present

## 2019-08-31 DIAGNOSIS — I509 Heart failure, unspecified: Secondary | ICD-10-CM | POA: Diagnosis not present

## 2019-09-01 ENCOUNTER — Encounter: Payer: BC Managed Care – PPO | Attending: Neurology | Admitting: Dietician

## 2019-09-01 ENCOUNTER — Encounter: Payer: Self-pay | Admitting: Dietician

## 2019-09-01 ENCOUNTER — Other Ambulatory Visit: Payer: Self-pay

## 2019-09-01 VITALS — Ht 68.0 in | Wt 220.2 lb

## 2019-09-01 DIAGNOSIS — E1141 Type 2 diabetes mellitus with diabetic mononeuropathy: Secondary | ICD-10-CM | POA: Diagnosis not present

## 2019-09-01 NOTE — Progress Notes (Signed)
Medical Nutrition Therapy: Visit start time: 0900  end time: 1000  Assessment:  Diagnosis: Type 2 diabetes Past medical history: HTN, CHF  Psychosocial issues/ stress concerns: none   Preferred learning method:  . Auditory . Visual . Hands-on       Current weight: 220.2lbs Height: 5'8" Medications, supplements: reconciled list in medical record  Progress and evaluation:   Patient reports history of dm for 20-25 years, has neuropathy in feet with some pain, numbness. He has had toenail, foot, lower leg abrasions that he did not feel. He does check his feet daily.   He checks BGs daily in am, but often not true fasting because he often snacks at 12-12:30am due to hunger. Typical results are ranging 170-210; had a 131 this morning.   He reports weight loss after having heart surgery 1 1/2 years ago, but regained some weight in part due to CHF. He was recently >230lbs and has worked to resume healthier habits and lose weight. He has lost about 10lbs so far, with a personal goal of about 200lbs   Patient was laid off from his job due to [place], currently applying for disability.   he cooks most supper meals for himself and daughter who lives next door.      Phycsial  Activity: no activity at this time              Dietary Intake Usual eating pattern includes 2-3 meals and 1-2 snacks per day. Dining out frequency: 3-4 meals per week.   Breakfast: egg sandwich; oatmeal; cereal; larger meal on weekends-- eggs, sausage, grits, toast; pancake on Sundays; sometimes skips if not hungry  Snack: none Lunch: Sometimes skips if not hungry, or busy; sometimes out-- burger Snack: fruit cup, applesauce, pkg crackers, chips   Supper: pork chop/ chicken (fried or barbecued)/ country style steak + potatoes/ beans/ pasta; sometimes homemade soup veg beef Snack: same as pm or occasional snack cake-- oatmeal cookie or fudge round Beverages: diet juices, sugar free hawaiian punch, diet soda, very little  water   Nutrition Care Education: Topics covered:  Basic nutrition: basic food groups, appropriate nutrient balance, appropriate mal and snack schedule, general nutrition guidelines       Weight control:  importance of low sugar and low fat choices, portion control Advanced nutrition:  cooking techniques Diabetes: appropriate meal and snack schedule, appropriate carb intake and balance, healthy carb choices, role of fiber, protein, fat; role of physical activity Hypertension/ CHF: identifying high sodium foods and goal for sodium intake, food sources of potassium, magnesium   Nutritional Diagnosis:  [place]-2.2 Altered nutrition-related laboratory As related to type 2 diabetes.  As evidenced by patient with recent HbA1C of 8.4%.  Intervention:   Instruction and discussion as noted above.  Patient has been working on healthier eating habits, and plans to resume some light exercise.   Established goals for consistent meal schedule and carb intake, with input from patient.   Education Materials given:  . General diet guidelines for Diabetes . Plate Planner with food lists . Recipes . Sample menus . Goals/ instructions   Learner/ who was taught:  . Patient   Level of understanding: [name] Verbalizes/ demonstrates competency  Demonstrated degree of understanding via:   Teach back Learning barriers: . None  Willingness to learn/ readiness for change: . Eager, change in progress  Monitoring and Evaluation:  Dietary intake, exercise, BG control, and body weight      follow up: 10/27/19 at 9:00am

## 2019-09-01 NOTE — Patient Instructions (Signed)
   Plan to eat something every 4-5 hours during the day, 3 meals and 1-2 snacks. Meals can be light and quick, as long as there is a balance with some protein and small-moderate portion of carbs.   Choose plenty of vegetables and fruits, beans, nuts, seeds for heart health and diabetes control.   Gradually increase some physical activity.

## 2019-09-04 ENCOUNTER — Ambulatory Visit: Payer: BC Managed Care – PPO | Admitting: Cardiology

## 2019-09-04 ENCOUNTER — Encounter: Payer: Self-pay | Admitting: Cardiology

## 2019-09-04 ENCOUNTER — Ambulatory Visit (INDEPENDENT_AMBULATORY_CARE_PROVIDER_SITE_OTHER): Payer: BC Managed Care – PPO | Admitting: Cardiology

## 2019-09-04 ENCOUNTER — Other Ambulatory Visit: Payer: Self-pay

## 2019-09-04 VITALS — BP 160/90 | HR 77 | Ht 68.0 in | Wt 222.0 lb

## 2019-09-04 DIAGNOSIS — I2510 Atherosclerotic heart disease of native coronary artery without angina pectoris: Secondary | ICD-10-CM | POA: Diagnosis not present

## 2019-09-04 DIAGNOSIS — I5020 Unspecified systolic (congestive) heart failure: Secondary | ICD-10-CM

## 2019-09-04 DIAGNOSIS — I10 Essential (primary) hypertension: Secondary | ICD-10-CM

## 2019-09-04 DIAGNOSIS — I471 Supraventricular tachycardia, unspecified: Secondary | ICD-10-CM

## 2019-09-04 MED ORDER — LOSARTAN POTASSIUM 100 MG PO TABS: 100.0000 mg | ORAL_TABLET | Freq: Every day | ORAL | 3 refills | Status: DC

## 2019-09-04 MED ORDER — ROSUVASTATIN CALCIUM 20 MG PO TABS: 20.0000 mg | ORAL_TABLET | Freq: Every day | ORAL | 3 refills | Status: DC

## 2019-09-04 NOTE — Patient Instructions (Signed)
Medication Instructions:  - Your physician has recommended you make the following change in your medication:   1) Increase cozaar (losartan) to 100 mg- take 1 tablet (100 mg) by mouth once daily  2) Increase crestor (rosuvastatin) to 20 mg- take 1 tablet (20 mg) by mouth once daily  *If you need a refill on your cardiac medications before your next appointment, please call your pharmacy*  Lab Work: - none ordered  If you have labs (blood work) drawn today and your tests are completely normal, you will receive your results only by: [name] MyChart Message (if you have MyChart) OR . A paper copy in the mail If you have any lab test that is abnormal or we need to change your treatment, we will call you to review the results.  Testing/Procedures: - Your physician has requested that you have an echocardiogram. Echocardiography is a painless test that uses sound waves to create images of your heart. It provides your doctor with information about the size and shape of your heart and how well your heart's chambers and valves are working. This procedure takes approximately one hour. There are no restrictions for this procedure.   Follow-Up: At [facility], you and your health needs are our priority.  As part of our continuing mission to provide you with exceptional heart care, we have created designated Provider Care Teams.  These Care Teams include your primary Cardiologist (physician) and Advanced Practice Providers (APPs -  Physician Assistants and Nurse Practitioners) who all work together to provide you with the care you need, when you need it.  Your next appointment:   5-6 week(s)/ after cardiac testing is completed  The format for your next appointment:   In Person  Provider:   [name], MD  Other Instructions N/a   Echocardiogram An echocardiogram is a procedure that uses painless sound waves (ultrasound) to produce an image of the heart. Images from an echocardiogram can  provide important information about:  Signs of coronary artery disease (CAD).  Aneurysm detection. An aneurysm is a weak or damaged part of an artery wall that bulges out from the normal force of blood pumping through the body.  Heart size and shape. Changes in the size or shape of the heart can be associated with certain conditions, including heart failure, aneurysm, and CAD.  Heart muscle function.  Heart valve function.  Signs of a past heart attack.  Fluid buildup around the heart.  Thickening of the heart muscle.  A tumor or infectious growth around the heart valves. Tell a health care provider about:  Any allergies you have.  All medicines you are taking, including vitamins, herbs, eye drops, creams, and over-the-counter medicines.  Any blood disorders you have.  Any surgeries you have had.  Any medical conditions you have.  Whether you are pregnant or may be pregnant. What are the risks? Generally, this is a safe procedure. However, problems may occur, including:  Allergic reaction to dye (contrast) that may be used during the procedure. What happens before the procedure? No specific preparation is needed. You may eat and drink normally. What happens during the procedure?   An IV tube may be inserted into one of your veins.  You may receive contrast through this tube. A contrast is an injection that improves the quality of the pictures from your heart.  A gel will be applied to your chest.  A wand-like tool (transducer) will be moved over your chest. The gel will help to transmit  the sound waves from the transducer.  The sound waves will harmlessly bounce off of your heart to allow the heart images to be captured in real-time motion. The images will be recorded on a computer. The procedure may vary among health care providers and hospitals. What happens after the procedure?  You may return to your normal, everyday life, including diet, activities, and  medicines, unless your health care provider tells you not to do that. Summary  An echocardiogram is a procedure that uses painless sound waves (ultrasound) to produce an image of the heart.  Images from an echocardiogram can provide important information about the size and shape of your heart, heart muscle function, heart valve function, and fluid buildup around your heart.  You do not need to do anything to prepare before this procedure. You may eat and drink normally.  After the echocardiogram is completed, you may return to your normal, everyday life, unless your health care provider tells you not to do that. This information is not intended to replace advice given to you by your health care provider. Make sure you discuss any questions you have with your health care provider. Document Revised: 11/10/2018 Document Reviewed: 08/22/2016 Elsevier Patient Education  [name].

## 2019-09-04 NOTE — Progress Notes (Signed)
Cardiology Office Note:    Date:  09/04/2019   ID:  Ricardo Brown, DOB [birth year], MRN [id number]  PCP:  [name], MD  Cardiologist:  [name], MD  Electrophysiologist:  None   Referring MD: [name], MD   Chief Complaint  Patient presents with  . New Patient (Initial Visit)    Patient reports "extreme" palpitations; Meds verbally reviewed with patient.   Ricardo Brown is a 59 y.o. male who is being seen today for the evaluation of palpitations and CHF at the request of [name], MD.  History of Present Illness:    Ricardo Brown is a 59 y.o. male with a hx of SVT on amiodarone, CAD/CABG x3 in 2019, HFrEF EF 35-40%, hypertension, hyperlipidemia, diabetes, family history of premature CAD in the father (MI in his 64s) presents due to CHF.  Patient was seen in the [facility] back in June 2019 due to nausea, weakness, myalgias.  EKG at the time showed inferior ST elevation with reciprocal changes in the anterior leads and small inferior Q waves.  Troponin was elevated up to 9.  Patient was deemed late presenting STEMI.  Left heart cath was performed, details below, briefly showed occluded distal RCA, 90% mid LAD, 90% proximal left circumflex.  Due to patient being a diabetic, and left heart cath showing diffuse three-vessel disease, CABG was recommended.  He underwent coronary artery bypass grafting x3 in July 2019 at [facility].    Patient with history of SVT, who was seen in the emergency room and adenosine given x2 and placed on amiodarone.  After his surgery, amiodarone was re started.  It was later stopped but his SVT returned and he restarted amiodarone and has not had any episodes of SVT since.  he states doing okay from a surgical perspective, but still has some dyspnea with exertion and some edema.  He denies overt chest pain but states having some palpitations and feel his sternum moves at times.  Past Medical History:    Diagnosis Date  . CHF (congestive heart failure) ([place])   . Coronary artery disease   . Diabetes mellitus without complication ([place])    a. Dx ~ 2000.  [name] Heart palpitations    a. Pt reports prior nl echo's and stress tests. Last stress test w/in past 2 yrs - PCP.  [name] High cholesterol   . Hypertension   . Myocardial infarction [facility])     Past Surgical History:  Procedure Laterality Date  . CARDIAC CATHETERIZATION    . CARDIAC VALVE REPLACEMENT     Mitral Valve Repair  . COLONOSCOPY WITH PROPOFOL N/A 06/07/2019   Procedure: COLONOSCOPY WITH PROPOFOL;  Surgeon: [name], MD;  Location: [facility] ENDOSCOPY;  Service: Endoscopy;  Laterality: N/A;  . CORONARY ARTERY BYPASS GRAFT     x 3   01/2018  . LEFT HEART CATH AND CORONARY ANGIOGRAPHY N/A 01/24/2018   Procedure: LEFT HEART CATH AND CORONARY ANGIOGRAPHY;  Surgeon: [name], MD;  Location: [place] CV LAB;  Service: Cardiovascular;  Laterality: N/A;  . TONSILLECTOMY      Current Medications: Current Meds  Medication Sig  . amiodarone (PACERONE) 200 MG tablet 200 mg daily.   [name] aspirin 81 MG EC tablet Take 81 mg by mouth daily.   [name] b complex vitamins tablet Take 1 tablet by mouth daily.   . cetirizine (ZYRTEC) 10 MG tablet Take 10 mg by mouth daily.  . Dulaglutide 1.5 MG/0.5ML  SOPN Inject into the skin once a week.   . empagliflozin (JARDIANCE) 25 MG TABS tablet Take 25 mg by mouth.   . furosemide (LASIX) 20 MG tablet Take 40 mg by mouth 2 (two) times daily.  [name] gabapentin (NEURONTIN) 100 MG capsule Take 100 mg by mouth 3 (three) times daily as needed.  . metFORMIN (GLUCOPHAGE) 500 MG tablet Take 1,000 mg by mouth 2 (two) times daily.  . metoprolol succinate (TOPROL-XL) 50 MG 24 hr tablet Take 1 tablet (50 mg) by mouth once daily. Take with or immediately following a meal.  . Omega-3 Fatty Acids (FISH OIL) 875 MG CAPS Take 1 capsule by mouth 2 (two) times daily.  . Pramipexole Dihydrochloride 1.5 MG TB24 Take by  mouth daily.   [name] spironolactone (ALDACTONE) 25 MG tablet Take 12.5 mg by mouth daily.   . [DISCONTINUED] losartan (COZAAR) 50 MG tablet Take 50 mg by mouth daily.  . [DISCONTINUED] metoprolol succinate (TOPROL-XL) 25 MG 24 hr tablet TAKE 1 2 (ONE HALF) TABLET BY MOUTH ONCE DAILY  . [DISCONTINUED] rosuvastatin (CRESTOR) 5 MG tablet Take 5 mg by mouth at bedtime.      Allergies:   Entresto [sacubitril-valsartan] and Lipitor [atorvastatin]   Social History   Socioeconomic History  . Marital status: Married    Spouse name: Not on file  . Number of children: Not on file  . Years of education: Not on file  . Highest education level: Not on file  Occupational History  . Not on file  Tobacco Use  . Smoking status: Never Smoker  . Smokeless tobacco: Never Used  Substance and Sexual Activity  . Alcohol use: Not Currently    Comment: rare beer  . Drug use: No  . Sexual activity: Not on file  Other Topics Concern  . Not on file  Social History Narrative   Lives locally with wife.  Works in [employer].  Does not routinely exercise.   Social Determinants of Health   Financial Resource Strain:   . Difficulty of Paying Living Expenses: Not on file  Food Insecurity:   . Worried About Charity fundraiser in the Last Year: Not on file  . Ran Out of Food in the Last Year: Not on file  Transportation Needs:   . Lack of Transportation (Medical): Not on file  . Lack of Transportation (Non-Medical): Not on file  Physical Activity:   . Days of Exercise per Week: Not on file  . Minutes of Exercise per Session: Not on file  Stress:   . Feeling of Stress : Not on file  Social Connections:   . Frequency of Communication with Friends and Family: Not on file  . Frequency of Social Gatherings with Friends and Family: Not on file  . Attends Religious Services: Not on file  . Active Member of Clubs or Organizations: Not on file  . Attends Archivist Meetings: Not on file  . Marital  Status: Not on file     Family History: The patient's family history includes CAD in his father; Cancer in his father and mother; Other in his brother.  ROS:   Please see the history of present illness.     All other systems reviewed and are negative.  EKGs/Labs/Other Studies Reviewed:    The following studies were reviewed today: LHC 2018/02/15  Prox RCA to Mid RCA lesion is 20% stenosed.  Dist RCA lesion is 100% stenosed.  Mid Cx to Dist Cx lesion is 90%  stenosed.  Prox LAD lesion is 60% stenosed.  Mid LAD lesion is 90% stenosed.  Ost 2nd Diag to 2nd Diag lesion is 85% stenosed.  Mid LM to Dist LM lesion is 20% stenosed.  Prox Cx lesion is 90% stenosed.   1.  Late presenting inferior ST elevation myocardial infarction of at least 48 hours duration.  The culprit is occluded distal right coronary artery with left to right collaterals noted especially to the right PDA.  In addition, there is diffuse diabetic three-vessel coronary artery disease affecting the proximal, mid and distal LAD as well as proximal and distal left circumflex. 2.  Moderately to severely elevated left ventricular end-diastolic pressure with an LVEDP of 29 to 30 mmHg.  Left ventricular angiography was not performed given renal failure.  TTE 01/2018 - Left ventricle: The cavity size was mildly dilated. There was  mild concentric hypertrophy. Systolic function was moderately  reduced. The estimated ejection fraction was in the range of 35%  to 40%. Akinesis of the inferolateral and inferior myocardium.  Features are consistent with a pseudonormal left ventricular  filling pattern, with concomitant abnormal relaxation and  increased filling pressure (grade 2 diastolic dysfunction).  - Mitral valve: There was mild to moderate regurgitation directed  posteriorly.  - Left atrium: The atrium was mildly dilated.  - Right atrium: The atrium was mildly dilated.  - Pulmonary arteries: Systolic pressure  was moderately increased.  PA peak pressure: 55 mm Hg (S).  - Inferior vena cava: The vessel was dilated. The respirophasic  diameter changes were blunted (< 50%), consistent with elevated  central venous pressure.   EKG:  EKG is  ordered today.  The ekg ordered today demonstrates sinus rhythm, right bundle branch block, old inferior infarct.  Recent Labs: 11/03/2018: Hemoglobin 12.9; Platelets 162; Potassium 3.9; Sodium 137  Recent Lipid Panel    Component Value Date/Time   CHOL 163 01/24/2018 1459   TRIG 120 01/24/2018 1459   HDL 23 (L) 01/24/2018 1459   CHOLHDL 7.1 01/24/2018 1459   VLDL 24 01/24/2018 1459   LDLCALC 116 (H) 01/24/2018 1459    Physical Exam:    VS:  BP (!) 160/90 (BP Location: Right Arm, Patient Position: Sitting, Cuff Size: Normal)   Pulse 77   Ht 5\' 8"  (1.727 m)   Wt 222 lb (100.7 kg)   SpO2 99%   BMI 33.75 kg/m     Wt Readings from Last 3 Encounters:  09/04/19 222 lb (100.7 kg)  09/01/19 220 lb 3.2 oz (99.9 kg)  06/07/19 228 lb 4 oz (103.5 kg)     GEN:  Well nourished, well developed in no acute distress HEENT: Normal NECK: No JVD; No carotid bruits LYMPHATICS: No lymphadenopathy CARDIAC: RRR, no murmurs, rubs, gallops RESPIRATORY:  Clear to auscultation without rales, wheezing or rhonchi  ABDOMEN: Soft, non-tender, non-distended MUSCULOSKELETAL:  1+ edema; No deformity  SKIN: Warm and dry NEUROLOGIC:  Alert and oriented x 3 PSYCHIATRIC:  Normal affect   ASSESSMENT:    1. Coronary artery disease involving native coronary artery of native heart without angina pectoris   2. HFrEF (heart failure with reduced ejection fraction) ([place])   3. Essential hypertension   4. SVT (supraventricular tachycardia) (H[facility])    PLAN:    In order of problems listed above:  1. Patient with history of CAD status post CABG x3.  Will increase Crestor to at least moderate intensity at 20 mg daily.  Continue aspirin 81 mg. 2. History of heart  failure  moderately reduced EF.  1+ edema noted on exam.  Will increase losartan to 100 mg daily, Toprol-XL 50 mg daily, Aldactone 12.5 mg daily.  Continue Lasix 40 mg twice daily.  Repeat echocardiogram to evaluate ejection fraction.  We will plan to increase Toprol-XL on follow-up visit if blood pressure permits and no adverse reactions noted. 3. Blood pressure not well controlled.  Increase losartan to 100 mg daily.  Check blood pressure daily. 4. History of SVT.  Continue amiodarone 200 mg daily   Follow-up after echocardiogram.   Medication Adjustments/Labs and Tests Ordered: Current medicines are reviewed at length with the patient today.  Concerns regarding medicines are outlined above.  Orders Placed This Encounter  Procedures  . EKG 12-Lead  . ECHOCARDIOGRAM COMPLETE   Meds ordered this encounter  Medications  . losartan (COZAAR) 100 MG tablet    Sig: Take 1 tablet (100 mg total) by mouth daily.    Dispense:  90 tablet    Refill:  3  . rosuvastatin (CRESTOR) 20 MG tablet    Sig: Take 1 tablet (20 mg total) by mouth daily.    Dispense:  90 tablet    Refill:  3    Patient Instructions  Medication Instructions:  - Your physician has recommended you make the following change in your medication:   1) Increase cozaar (losartan) to 100 mg- take 1 tablet (100 mg) by mouth once daily  2) Increase crestor (rosuvastatin) to 20 mg- take 1 tablet (20 mg) by mouth once daily  *If you need a refill on your cardiac medications before your next appointment, please call your pharmacy*  Lab Work: - none ordered  If you have labs (blood work) drawn today and your tests are completely normal, you will receive your results only by: [name] MyChart Message (if you have MyChart) OR . A paper copy in the mail If you have any lab test that is abnormal or we need to change your treatment, we will call you to review the results.  Testing/Procedures: - Your physician has requested that you have an  echocardiogram. Echocardiography is a painless test that uses sound waves to create images of your heart. It provides your doctor with information about the size and shape of your heart and how well your heart's chambers and valves are working. This procedure takes approximately one hour. There are no restrictions for this procedure.   Follow-Up: At [facility], you and your health needs are our priority.  As part of our continuing mission to provide you with exceptional heart care, we have created designated Provider Care Teams.  These Care Teams include your primary Cardiologist (physician) and Advanced Practice Providers (APPs -  Physician Assistants and Nurse Practitioners) who all work together to provide you with the care you need, when you need it.  Your next appointment:   5-6 week(s)/ after cardiac testing is completed  The format for your next appointment:   In Person  Provider:   [name], MD  Other Instructions N/a   Echocardiogram An echocardiogram is a procedure that uses painless sound waves (ultrasound) to produce an image of the heart. Images from an echocardiogram can provide important information about:  Signs of coronary artery disease (CAD).  Aneurysm detection. An aneurysm is a weak or damaged part of an artery wall that bulges out from the normal force of blood pumping through the body.  Heart size and shape. Changes in the size or shape of the heart can  be associated with certain conditions, including heart failure, aneurysm, and CAD.  Heart muscle function.  Heart valve function.  Signs of a past heart attack.  Fluid buildup around the heart.  Thickening of the heart muscle.  A tumor or infectious growth around the heart valves. Tell a health care provider about:  Any allergies you have.  All medicines you are taking, including vitamins, herbs, eye drops, creams, and over-the-counter medicines.  Any blood disorders you have.  Any  surgeries you have had.  Any medical conditions you have.  Whether you are pregnant or may be pregnant. What are the risks? Generally, this is a safe procedure. However, problems may occur, including:  Allergic reaction to dye (contrast) that may be used during the procedure. What happens before the procedure? No specific preparation is needed. You may eat and drink normally. What happens during the procedure?   An IV tube may be inserted into one of your veins.  You may receive contrast through this tube. A contrast is an injection that improves the quality of the pictures from your heart.  A gel will be applied to your chest.  A wand-like tool (transducer) will be moved over your chest. The gel will help to transmit the sound waves from the transducer.  The sound waves will harmlessly bounce off of your heart to allow the heart images to be captured in real-time motion. The images will be recorded on a computer. The procedure may vary among health care providers and hospitals. What happens after the procedure?  You may return to your normal, everyday life, including diet, activities, and medicines, unless your health care provider tells you not to do that. Summary  An echocardiogram is a procedure that uses painless sound waves (ultrasound) to produce an image of the heart.  Images from an echocardiogram can provide important information about the size and shape of your heart, heart muscle function, heart valve function, and fluid buildup around your heart.  You do not need to do anything to prepare before this procedure. You may eat and drink normally.  After the echocardiogram is completed, you may return to your normal, everyday life, unless your health care provider tells you not to do that. This information is not intended to replace advice given to you by your health care provider. Make sure you discuss any questions you have with your health care provider. Document  Revised: 11/10/2018 Document Reviewed: 08/22/2016 Elsevier Patient Education  2020 [place], [name], MD  09/04/2019 3:35 PM    [facility]

## 2019-09-07 DIAGNOSIS — G629 Polyneuropathy, unspecified: Secondary | ICD-10-CM | POA: Diagnosis not present

## 2019-09-07 DIAGNOSIS — R2689 Other abnormalities of gait and mobility: Secondary | ICD-10-CM | POA: Diagnosis not present

## 2019-09-07 DIAGNOSIS — G479 Sleep disorder, unspecified: Secondary | ICD-10-CM | POA: Diagnosis not present

## 2019-09-07 DIAGNOSIS — R200 Anesthesia of skin: Secondary | ICD-10-CM | POA: Diagnosis not present

## 2019-09-07 HISTORY — DX: Sleep disorder, unspecified: G47.9

## 2019-09-26 ENCOUNTER — Other Ambulatory Visit: Payer: Self-pay

## 2019-09-26 ENCOUNTER — Ambulatory Visit (INDEPENDENT_AMBULATORY_CARE_PROVIDER_SITE_OTHER): Payer: BC Managed Care – PPO

## 2019-09-26 DIAGNOSIS — I5020 Unspecified systolic (congestive) heart failure: Secondary | ICD-10-CM | POA: Diagnosis not present

## 2019-10-02 DIAGNOSIS — E7800 Pure hypercholesterolemia, unspecified: Secondary | ICD-10-CM | POA: Diagnosis not present

## 2019-10-02 DIAGNOSIS — E669 Obesity, unspecified: Secondary | ICD-10-CM | POA: Diagnosis not present

## 2019-10-02 DIAGNOSIS — E1140 Type 2 diabetes mellitus with diabetic neuropathy, unspecified: Secondary | ICD-10-CM | POA: Diagnosis not present

## 2019-10-02 DIAGNOSIS — I10 Essential (primary) hypertension: Secondary | ICD-10-CM | POA: Diagnosis not present

## 2019-10-04 DIAGNOSIS — E669 Obesity, unspecified: Secondary | ICD-10-CM | POA: Insufficient documentation

## 2019-10-12 DIAGNOSIS — H2513 Age-related nuclear cataract, bilateral: Secondary | ICD-10-CM | POA: Diagnosis not present

## 2019-10-12 DIAGNOSIS — E113513 Type 2 diabetes mellitus with proliferative diabetic retinopathy with macular edema, bilateral: Secondary | ICD-10-CM | POA: Diagnosis not present

## 2019-10-12 DIAGNOSIS — H3582 Retinal ischemia: Secondary | ICD-10-CM | POA: Diagnosis not present

## 2019-10-12 DIAGNOSIS — H35372 Puckering of macula, left eye: Secondary | ICD-10-CM | POA: Diagnosis not present

## 2019-10-16 ENCOUNTER — Other Ambulatory Visit: Payer: Self-pay

## 2019-10-16 ENCOUNTER — Ambulatory Visit (INDEPENDENT_AMBULATORY_CARE_PROVIDER_SITE_OTHER): Payer: BC Managed Care – PPO | Admitting: Cardiology

## 2019-10-16 ENCOUNTER — Encounter: Payer: Self-pay | Admitting: Cardiology

## 2019-10-16 VITALS — BP 128/70 | HR 83 | Ht 68.0 in | Wt 213.0 lb

## 2019-10-16 DIAGNOSIS — I2510 Atherosclerotic heart disease of native coronary artery without angina pectoris: Secondary | ICD-10-CM | POA: Diagnosis not present

## 2019-10-16 DIAGNOSIS — I471 Supraventricular tachycardia: Secondary | ICD-10-CM

## 2019-10-16 DIAGNOSIS — I10 Essential (primary) hypertension: Secondary | ICD-10-CM

## 2019-10-16 DIAGNOSIS — I5020 Unspecified systolic (congestive) heart failure: Secondary | ICD-10-CM

## 2019-10-16 NOTE — Patient Instructions (Signed)
Medication Instructions:  Your physician recommends that you continue on your current medications as directed. Please refer to the Current Medication list given to you today.  *If you need a refill on your cardiac medications before your next appointment, please call your pharmacy*  Lab Work: Your physician recommends that you return for lab work in: today - BMET.  If you have labs (blood work) drawn today and your tests are completely normal, you will receive your results only by: [name] MyChart Message (if you have MyChart) OR . A paper copy in the mail If you have any lab test that is abnormal or we need to change your treatment, we will call you to review the results.  Follow-Up: At [facility], you and your health needs are our priority.  As part of our continuing mission to provide you with exceptional heart care, we have created designated Provider Care Teams.  These Care Teams include your primary Cardiologist (physician) and Advanced Practice Providers (APPs -  Physician Assistants and Nurse Practitioners) who all work together to provide you with the care you need, when you need it.  We recommend signing up for the patient portal called "MyChart".  Sign up information is provided on this After Visit Summary.  MyChart is used to connect with patients for Virtual Visits (Telemedicine).  Patients are able to view lab/test results, encounter notes, upcoming appointments, etc.  Non-urgent messages can be sent to your provider as well.   To learn more about what you can do with MyChart, go to [email or web address].    Your next appointment:   6 month(s)  The format for your next appointment:   In Person  Provider:   [name], MD

## 2019-10-16 NOTE — Progress Notes (Signed)
Cardiology Office Note:    Date:  10/16/2019   ID:  Ricardo Brown, DOB July 23, 1961, MRN [id number]  PCP:  [name], MD  Cardiologist:  [name], MD  Electrophysiologist:  None   Referring MD: [name], MD   Chief Complaint  Patient presents with  . Other    6 week follow up post ECHO. Meds reviewed verbally with patient.      History of Present Illness:    Ricardo Brown is a 59 y.o. male with a hx of SVT on amiodarone, CAD/CABG x3 in 2019, HFrEF EF 35-40%, hypertension, hyperlipidemia, diabetes, family history of premature CAD in the father (MI in his 49s) presents for follow-up.  He was last seen due to history of CHF.  Patient was seen in the [facility] in June 2019 due to nausea, weakness, myalgias.  EKG at the time showed inferior ST elevation with reciprocal changes in the anterior leads and small inferior Q waves.  Troponin was elevated up to 9.  Patient was deemed late presenting STEMI.  Left heart cath was performed, details below, briefly showed occluded distal RCA, 90% mid LAD, 90% proximal left circumflex.  Due to patient being a diabetic, and left heart cath showing diffuse three-vessel disease, CABG was recommended.  He underwent coronary artery bypass grafting x3 in July 2019 at [facility].    Patient with history of SVT, who was seen in the emergency room and adenosine given x2 and placed on amiodarone.  After his surgery, amiodarone was re started.  It was later stopped but his SVT returned and he restarted amiodarone and has not had any further episodes of SVT since.  he states doing okay from a surgical perspective, but still has some dyspnea with exertion and some edema. he denies overt chest pain but states having some palpitations and feel his sternum moves at times.  Lasix was increased and echocardiogram was ordered.  He now presents for results.  He states doing okay, only gets short of breath when he overexerts  himself.  Past Medical History:  Diagnosis Date  . CHF (congestive heart failure) ([place])   . Coronary artery disease   . Diabetes mellitus without complication ([place])    a. Dx ~ 2000.  [name] Heart palpitations    a. Pt reports prior nl echo's and stress tests. Last stress test w/in past 2 yrs - PCP.  [name] High cholesterol   . Hypertension   . Myocardial infarction [facility])     Past Surgical History:  Procedure Laterality Date  . CARDIAC CATHETERIZATION    . CARDIAC VALVE REPLACEMENT     Mitral Valve Repair  . COLONOSCOPY WITH PROPOFOL N/A 06/07/2019   Procedure: COLONOSCOPY WITH PROPOFOL;  Surgeon: [name], MD;  Location: [facility] ENDOSCOPY;  Service: Endoscopy;  Laterality: N/A;  . CORONARY ARTERY BYPASS GRAFT     x 3   01/2018  . LEFT HEART CATH AND CORONARY ANGIOGRAPHY N/A 01/24/2018   Procedure: LEFT HEART CATH AND CORONARY ANGIOGRAPHY;  Surgeon: [name], MD;  Location: [place] CV LAB;  Service: Cardiovascular;  Laterality: N/A;  . TONSILLECTOMY      Current Medications: Current Meds  Medication Sig  . amiodarone (PACERONE) 200 MG tablet 200 mg daily.   [name] aspirin 81 MG EC tablet Take 81 mg by mouth daily.   [name] b complex vitamins tablet Take 1 tablet by mouth daily.   . cetirizine (ZYRTEC) 10 MG tablet Take 10 mg  by mouth daily.  . Dulaglutide 3 MG/0.5ML SOPN Inject into the skin once a week.   . empagliflozin (JARDIANCE) 25 MG TABS tablet Take 25 mg by mouth.   . furosemide (LASIX) 20 MG tablet Take 40 mg by mouth 2 (two) times daily.  [name] losartan (COZAAR) 100 MG tablet Take 1 tablet (100 mg total) by mouth daily.  . metFORMIN (GLUCOPHAGE) 500 MG tablet Take 1,000 mg by mouth 2 (two) times daily.  . metoprolol succinate (TOPROL-XL) 50 MG 24 hr tablet Take 1 tablet (50 mg) by mouth once daily. Take with or immediately following a meal.  . nortriptyline (PAMELOR) 10 MG capsule Take 10 mg by mouth daily.  . Omega-3 Fatty Acids (FISH OIL) 875 MG CAPS Take 1 capsule  by mouth 2 (two) times daily.  . Pramipexole Dihydrochloride 1.5 MG TB24 Take by mouth daily.   . rosuvastatin (CRESTOR) 20 MG tablet Take 1 tablet (20 mg total) by mouth daily.  [name] spironolactone (ALDACTONE) 25 MG tablet Take 12.5 mg by mouth daily.   . traZODone (DESYREL) 50 MG tablet      Allergies:   Entresto [sacubitril-valsartan], Lipitor [atorvastatin], and Pregabalin   Social History   Socioeconomic History  . Marital status: Married    Spouse name: Not on file  . Number of children: Not on file  . Years of education: Not on file  . Highest education level: Not on file  Occupational History  . Not on file  Tobacco Use  . Smoking status: Never Smoker  . Smokeless tobacco: Never Used  Substance and Sexual Activity  . Alcohol use: Not Currently    Comment: rare beer  . Drug use: No  . Sexual activity: Not on file  Other Topics Concern  . Not on file  Social History Narrative   Lives locally with wife.  Works in [employer].  Does not routinely exercise.   Social Determinants of Health   Financial Resource Strain:   . Difficulty of Paying Living Expenses:   Food Insecurity:   . Worried About Charity fundraiser in the Last Year:   . Arboriculturist in the Last Year:   Transportation Needs:   . Film/video editor (Medical):   [name] Lack of Transportation (Non-Medical):   Physical Activity:   . Days of Exercise per Week:   . Minutes of Exercise per Session:   Stress:   . Feeling of Stress :   Social Connections:   . Frequency of Communication with Friends and Family:   . Frequency of Social Gatherings with Friends and Family:   . Attends Religious Services:   . Active Member of Clubs or Organizations:   . Attends Archivist Meetings:   [name] Marital Status:      Family History: The patient's family history includes CAD in his father; Cancer in his father and mother; Other in his brother.  ROS:   Please see the history of present illness.     All  other systems reviewed and are negative.  EKGs/Labs/Other Studies Reviewed:    The following studies were reviewed today: LHC February 21, 2018  Prox RCA to Mid RCA lesion is 20% stenosed.  Dist RCA lesion is 100% stenosed.  Mid Cx to Dist Cx lesion is 90% stenosed.  Prox LAD lesion is 60% stenosed.  Mid LAD lesion is 90% stenosed.  Ost 2nd Diag to 2nd Diag lesion is 85% stenosed.  Mid LM to Dist LM lesion is 20%  stenosed.  Prox Cx lesion is 90% stenosed.   1.  Late presenting inferior ST elevation myocardial infarction of at least 48 hours duration.  The culprit is occluded distal right coronary artery with left to right collaterals noted especially to the right PDA.  In addition, there is diffuse diabetic three-vessel coronary artery disease affecting the proximal, mid and distal LAD as well as proximal and distal left circumflex. 2.  Moderately to severely elevated left ventricular end-diastolic pressure with an LVEDP of 29 to 30 mmHg.  Left ventricular angiography was not performed given renal failure.  TTE 01/2018 - Left ventricle: The cavity size was mildly dilated. There was  mild concentric hypertrophy. Systolic function was moderately  reduced. The estimated ejection fraction was in the range of 35%  to 40%. Akinesis of the inferolateral and inferior myocardium.  Features are consistent with a pseudonormal left ventricular  filling pattern, with concomitant abnormal relaxation and  increased filling pressure (grade 2 diastolic dysfunction).  - Mitral valve: There was mild to moderate regurgitation directed  posteriorly.  - Left atrium: The atrium was mildly dilated.  - Right atrium: The atrium was mildly dilated.  - Pulmonary arteries: Systolic pressure was moderately increased.  PA peak pressure: 55 mm Hg (S).  - Inferior vena cava: The vessel was dilated. The respirophasic  diameter changes were blunted (< 50%), consistent with elevated  central venous  pressure.   EKG:  EKG is  ordered today.  The ekg ordered today demonstrates sinus rhythm, right bundle branch block,  Recent Labs: 11/03/2018: Hemoglobin 12.9; Platelets 162; Potassium 3.9; Sodium 137  Recent Lipid Panel    Component Value Date/Time   CHOL 163 01/24/2018 1459   TRIG 120 01/24/2018 1459   HDL 23 (L) 01/24/2018 1459   CHOLHDL 7.1 01/24/2018 1459   VLDL 24 01/24/2018 1459   LDLCALC 116 (H) 01/24/2018 1459    Physical Exam:    VS:  BP 128/70 (BP Location: Left Arm, Patient Position: Sitting, Cuff Size: Normal)   Pulse 83   Ht 5\' 8"  (1.727 m)   Wt 213 lb (96.6 kg)   SpO2 97%   BMI 32.39 kg/m     Wt Readings from Last 3 Encounters:  10/16/19 213 lb (96.6 kg)  09/04/19 222 lb (100.7 kg)  09/01/19 220 lb 3.2 oz (99.9 kg)     GEN:  Well nourished, well developed in no acute distress HEENT: Normal NECK: No JVD; No carotid bruits LYMPHATICS: No lymphadenopathy CARDIAC: RRR, no murmurs, rubs, gallops RESPIRATORY:  Clear to auscultation without rales, wheezing or rhonchi  ABDOMEN: Soft, non-tender, non-distended MUSCULOSKELETAL:  no edema; No deformity  SKIN: Warm and dry NEUROLOGIC:  Alert and oriented x 3 PSYCHIATRIC:  Normal affect   ASSESSMENT:    1. Coronary artery disease involving native coronary artery of native heart without angina pectoris   2. HFrEF (heart failure with reduced ejection fraction) ([place])   3. Essential hypertension   4. SVT (supraventricular tachycardia) ([facility])    PLAN:    In order of problems listed above:  1. Patient with history of CAD status post CABG x3.  Will increase Crestor to at least moderate intensity at 20 mg daily.  Continue aspirin 81 mg. 2. History of heart failure moderately reduced EF.  Repeat echocardiogram showed mild to moderately reduced ejection fraction with EF 40 to 45%.  1+ edema noted on exam.  Continue losartan 100 mg daily, Toprol-XL 50 mg daily, Aldactone 12.5 mg daily.  Lasix  40 mg twice daily.  Get a  BMP to evaluate kidney function and potassium in light of increasing Lasix dose.  He is currently euvolemic.  He describes class II NYHA symptoms.  Patient advised to check weights daily.  Take an extra Lasix if daily weight goes over 3 pounds, or weekly weight goes over 5 pounds.  Blood pressure not well controlled.  Continue losartan 100 mg daily.  3. History of SVT.  Continue amiodarone 200 mg daily   Follow-up in 6 months   Medication Adjustments/Labs and Tests Ordered: Current medicines are reviewed at length with the patient today.  Concerns regarding medicines are outlined above.  Orders Placed This Encounter  Procedures  . Basic metabolic panel  . EKG 12-Lead   No orders of the defined types were placed in this encounter.   Patient Instructions  Medication Instructions:  Your physician recommends that you continue on your current medications as directed. Please refer to the Current Medication list given to you today.  *If you need a refill on your cardiac medications before your next appointment, please call your pharmacy*  Lab Work: Your physician recommends that you return for lab work in: today - BMET.  If you have labs (blood work) drawn today and your tests are completely normal, you will receive your results only by: [name] MyChart Message (if you have MyChart) OR . A paper copy in the mail If you have any lab test that is abnormal or we need to change your treatment, we will call you to review the results.  Follow-Up: At [facility], you and your health needs are our priority.  As part of our continuing mission to provide you with exceptional heart care, we have created designated Provider Care Teams.  These Care Teams include your primary Cardiologist (physician) and Advanced Practice Providers (APPs -  Physician Assistants and Nurse Practitioners) who all work together to provide you with the care you need, when you need it.  We recommend signing up for the patient  portal called "MyChart".  Sign up information is provided on this After Visit Summary.  MyChart is used to connect with patients for Virtual Visits (Telemedicine).  Patients are able to view lab/test results, encounter notes, upcoming appointments, etc.  Non-urgent messages can be sent to your provider as well.   To learn more about what you can do with MyChart, go to [email or web address].    Your next appointment:   6 month(s)  The format for your next appointment:   In Person  Provider:   [name], MD     Signed, [name], MD  10/16/2019 12:25 PM    [place]

## 2019-10-17 ENCOUNTER — Telehealth: Payer: Self-pay

## 2019-10-17 LAB — BASIC METABOLIC PANEL
BUN/Creatinine Ratio: 18 (ref 9–20)
BUN: 26 mg/dL — ABNORMAL HIGH (ref 6–24)
CO2: 23 mmol/L (ref 20–29)
Calcium: 9.4 mg/dL (ref 8.7–10.2)
Chloride: 96 mmol/L (ref 96–106)
Creatinine, Ser: 1.47 mg/dL — ABNORMAL HIGH (ref 0.76–1.27)
GFR calc Af Amer: 60 mL/min/{1.73_m2} (ref >59)
GFR calc non Af Amer: 52 mL/min/{1.73_m2} — ABNORMAL LOW (ref >59)
Glucose: 131 mg/dL — ABNORMAL HIGH (ref 65–99)
Potassium: 4.8 mmol/L (ref 3.5–5.2)
Sodium: 137 mmol/L (ref 134–144)

## 2019-10-17 NOTE — Telephone Encounter (Signed)
Spoke with patient regarding results and recommendation.  Patient verbalizes understanding and is agreeable to plan of care. Advised patient to call back with any issues or concerns.  

## 2019-10-17 NOTE — Telephone Encounter (Signed)
-----   Message from [name], MD sent at 10/17/2019  9:58 AM EDT ----- Creatinine seems to be at baseline/slightly improved from prior.  Continue medications as prescribed.  Thank you

## 2019-10-17 NOTE — Telephone Encounter (Signed)
Left message on patients voicemail to please return our call.   

## 2019-10-27 ENCOUNTER — Ambulatory Visit: Payer: BC Managed Care – PPO | Admitting: Dietician

## 2019-10-30 DIAGNOSIS — I471 Supraventricular tachycardia: Secondary | ICD-10-CM | POA: Diagnosis not present

## 2019-10-30 DIAGNOSIS — G629 Polyneuropathy, unspecified: Secondary | ICD-10-CM | POA: Diagnosis not present

## 2019-10-30 DIAGNOSIS — E119 Type 2 diabetes mellitus without complications: Secondary | ICD-10-CM | POA: Diagnosis not present

## 2019-10-30 DIAGNOSIS — E1165 Type 2 diabetes mellitus with hyperglycemia: Secondary | ICD-10-CM | POA: Diagnosis not present

## 2019-11-11 ENCOUNTER — Emergency Department
Admission: EM | Admit: 2019-11-11 | Discharge: 2019-11-11 | Disposition: A | Discharge status: Home / Self Care | Payer: BC Managed Care – PPO | Attending: Emergency Medicine | Admitting: Emergency Medicine

## 2019-11-11 ENCOUNTER — Encounter: Payer: Self-pay | Admitting: Emergency Medicine

## 2019-11-11 ENCOUNTER — Emergency Department: Payer: BC Managed Care – PPO

## 2019-11-11 ENCOUNTER — Other Ambulatory Visit: Payer: Self-pay

## 2019-11-11 DIAGNOSIS — I110 Hypertensive heart disease with heart failure: Secondary | ICD-10-CM | POA: Insufficient documentation

## 2019-11-11 DIAGNOSIS — I471 Supraventricular tachycardia: Secondary | ICD-10-CM | POA: Diagnosis not present

## 2019-11-11 DIAGNOSIS — Z79899 Other long term (current) drug therapy: Secondary | ICD-10-CM | POA: Insufficient documentation

## 2019-11-11 DIAGNOSIS — Z951 Presence of aortocoronary bypass graft: Secondary | ICD-10-CM | POA: Diagnosis not present

## 2019-11-11 DIAGNOSIS — R079 Chest pain, unspecified: Secondary | ICD-10-CM | POA: Diagnosis not present

## 2019-11-11 DIAGNOSIS — E119 Type 2 diabetes mellitus without complications: Secondary | ICD-10-CM | POA: Diagnosis not present

## 2019-11-11 DIAGNOSIS — I2510 Atherosclerotic heart disease of native coronary artery without angina pectoris: Secondary | ICD-10-CM | POA: Diagnosis not present

## 2019-11-11 DIAGNOSIS — Z952 Presence of prosthetic heart valve: Secondary | ICD-10-CM | POA: Insufficient documentation

## 2019-11-11 DIAGNOSIS — I509 Heart failure, unspecified: Secondary | ICD-10-CM | POA: Insufficient documentation

## 2019-11-11 DIAGNOSIS — I252 Old myocardial infarction: Secondary | ICD-10-CM | POA: Diagnosis not present

## 2019-11-11 DIAGNOSIS — R002 Palpitations: Secondary | ICD-10-CM | POA: Diagnosis not present

## 2019-11-11 DIAGNOSIS — Z7982 Long term (current) use of aspirin: Secondary | ICD-10-CM | POA: Diagnosis not present

## 2019-11-11 DIAGNOSIS — Z7984 Long term (current) use of oral hypoglycemic drugs: Secondary | ICD-10-CM | POA: Diagnosis not present

## 2019-11-11 LAB — BASIC METABOLIC PANEL
Anion gap: 12 (ref 5–15)
BUN: 23 mg/dL — ABNORMAL HIGH (ref 6–20)
CO2: 26 mmol/L (ref 22–32)
Calcium: 9 mg/dL (ref 8.9–10.3)
Chloride: 99 mmol/L (ref 98–111)
Creatinine, Ser: 1.42 mg/dL — ABNORMAL HIGH (ref 0.61–1.24)
GFR calc Af Amer: >60 mL/min (ref >60)
GFR calc non Af Amer: 54 mL/min — ABNORMAL LOW (ref >60)
Glucose, Bld: 170 mg/dL — ABNORMAL HIGH (ref 70–99)
Potassium: 3.7 mmol/L (ref 3.5–5.1)
Sodium: 137 mmol/L (ref 135–145)

## 2019-11-11 LAB — CBC
HCT: 44.7 % (ref 39.0–52.0)
Hemoglobin: 15.6 g/dL (ref 13.0–17.0)
MCH: 30.4 pg (ref 26.0–34.0)
MCHC: 34.9 g/dL (ref 30.0–36.0)
MCV: 87 fL (ref 80.0–100.0)
Platelets: 223 10*3/uL (ref 150–400)
RBC: 5.14 MIL/uL (ref 4.22–5.81)
RDW: 12 % (ref 11.5–15.5)
WBC: 10.6 10*3/uL — ABNORMAL HIGH (ref 4.0–10.5)
nRBC: 0 % (ref 0.0–0.2)

## 2019-11-11 LAB — TROPONIN I (HIGH SENSITIVITY): Troponin I (High Sensitivity): 8 ng/L (ref <18)

## 2019-11-11 MED ORDER — SODIUM CHLORIDE 0.9 % IV BOLUS
1000.0000 mL | Freq: Once | INTRAVENOUS | Status: AC
  Administered 2019-11-11: 1000 mL via INTRAVENOUS

## 2019-11-11 MED ORDER — METOPROLOL SUCCINATE ER 50 MG PO TB24: 100.0000 mg | ORAL_TABLET | Freq: Every day | ORAL | 0 refills | Status: DC

## 2019-11-11 MED ORDER — ADENOSINE 6 MG/2ML IV SOLN
6.0000 mg | Freq: Once | INTRAVENOUS | Status: AC
  Administered 2019-11-11: 6 mg via INTRAVENOUS
  Filled 2019-11-11: qty 2

## 2019-11-11 MED ORDER — METOPROLOL TARTRATE 5 MG/5ML IV SOLN
5.0000 mg | Freq: Once | INTRAVENOUS | Status: AC
  Administered 2019-11-11: 5 mg via INTRAVENOUS
  Filled 2019-11-11: qty 5

## 2019-11-11 MED ORDER — ADENOSINE 12 MG/4ML IV SOLN
12.0000 mg | Freq: Once | INTRAVENOUS | Status: AC
  Administered 2019-11-11: 12 mg via INTRAVENOUS
  Filled 2019-11-11: qty 4

## 2019-11-11 MED ORDER — SODIUM CHLORIDE 0.9% FLUSH: 3.0000 mL | Freq: Once | INTRAVENOUS | Status: DC

## 2019-11-11 NOTE — ED Triage Notes (Signed)
Pt presents to ED via POV with c/o weakness, palpitations, and generalized CP x1.5 hrs. Pt states has been intermittent x 1 week. Upon arrival pt's HR noted to be 140's. Pt noted to be mildly diaphoretic on arrival. Pt otherwise alert and oriented. Pt states prescribed pacerone and metoprolol from previous episodes of SVT. Pt states attempted vagal maneuvers prior to arrival without success.

## 2019-11-11 NOTE — ED Provider Notes (Signed)
-----------------------------------------   3:10 PM on 11/11/2019 -----------------------------------------  Blood pressure 129/70, pulse 86, temperature 98.2 F (36.8 C), temperature source Oral, resp. rate (!) 26, height 5\' 8"  (1.727 m), weight 95.3 kg, SpO2 100 %.  Assuming care from Dr. Quentin Cornwall.  In short, Ricardo Brown is a 59 y.o. male with a chief complaint of Tachycardia and Weakness .  Refer to the original H&P for additional details.  The current plan of care is to observe for any recurrence of SVT.  ----------------------------------------- 4:03 PM on 11/11/2019 -----------------------------------------  Patient remains comfortable at this time and in normal sinus rhythm with no recurrence of SVT.  He is asymptomatic and appropriate for discharge home, advised to follow-up with his cardiologist and to return to the ED for new or worsening symptoms.    Blake Divine, MD 11/11/19 [phone]

## 2019-11-11 NOTE — ED Notes (Signed)
No change in EKG following 6mg  IV A

## 2019-11-11 NOTE — ED Provider Notes (Signed)
[facility] Emergency Department Provider Note    First MD Initiated Contact with Patient 11/11/19 1337     (approximate)  I have reviewed the triage vital signs and the nursing notes.   HISTORY  Chief Complaint Tachycardia and Weakness    HPI Ricardo Brown is a 59 y.o. male the below's past medical history presents to the ER for chest discomfort and palpitations that started several hours prior.  Has multiple presentations in the past for SVT.  Is on amiodarone as well as metoprolol.  Took his amiodarone this morning.  Denies any fevers no nausea or vomiting.  Does feel generalized weakness and fatigue since the onset of symptoms.    Past Medical History:  Diagnosis Date  . CHF (congestive heart failure) ([place])   . Coronary artery disease   . Diabetes mellitus without complication ([place])    a. Dx ~ 2000.  [name] Heart palpitations    a. Pt reports prior nl echo's and stress tests. Last stress test w/in past 2 yrs - PCP.  [name] High cholesterol   . Hypertension   . Myocardial infarction [facility])    Family History  Problem Relation Age of Onset  . Cancer Mother        died @ 31  . CAD Father        First MI @ 69. S/p heart transplant. Died in mid-50's of cancer.  . Cancer Father   . Other Brother        alive and well   Past Surgical History:  Procedure Laterality Date  . CARDIAC CATHETERIZATION    . CARDIAC VALVE REPLACEMENT     Mitral Valve Repair  . COLONOSCOPY WITH PROPOFOL N/A 06/07/2019   Procedure: COLONOSCOPY WITH PROPOFOL;  Surgeon: [name], MD;  Location: [facility] ENDOSCOPY;  Service: Endoscopy;  Laterality: N/A;  . CORONARY ARTERY BYPASS GRAFT     x 3   01/2018  . LEFT HEART CATH AND CORONARY ANGIOGRAPHY N/A 01/24/2018   Procedure: LEFT HEART CATH AND CORONARY ANGIOGRAPHY;  Surgeon: [name], MD;  Location: [place] CV LAB;  Service: Cardiovascular;  Laterality: N/A;  . TONSILLECTOMY     Patient Active Problem  List   Diagnosis Date Noted  . Screening for colon cancer   . Polyp of colon   . Coronary artery disease 05/31/2019  . PSVT (paroxysmal supraventricular tachycardia) ([place]) 03/22/2018  . Diabetes mellitus type 2, uncomplicated ([place]) 123456  . HLD (hyperlipidemia) 03/07/2018  . Hypertension 03/07/2018  . Right ventricular dysfunction 02/02/2018  . Postoperative atrial fibrillation ([place]) 02/01/2018  . Leukocytosis 01/31/2018  . S/P CABG x 3 01/28/2018  . STEMI (ST elevation myocardial infarction) ([place]) 01/24/2018      Prior to Admission medications   Medication Sig Start Date End Date Taking? Authorizing Provider  amiodarone (PACERONE) 200 MG tablet 200 mg daily.  05/21/19   [provider]  aspirin 81 MG EC tablet Take 81 mg by mouth daily.     [provider]  b complex vitamins tablet Take 1 tablet by mouth daily.     [provider]  cetirizine (ZYRTEC) 10 MG tablet Take 10 mg by mouth daily. 08/14/19   [provider]  Dulaglutide 3 MG/0.5ML SOPN Inject into the skin once a week.  03/30/18   [provider]  empagliflozin (JARDIANCE) 25 MG TABS tablet Take 25 mg by mouth.  08/20/19 08/19/20  [provider]  furosemide (LASIX) 20  MG tablet Take 40 mg by mouth 2 (two) times daily. 05/08/19   [provider]  losartan (COZAAR) 100 MG tablet Take 1 tablet (100 mg total) by mouth daily. 09/04/19 12/03/19  [name], MD  metFORMIN (GLUCOPHAGE) 500 MG tablet Take 1,000 mg by mouth 2 (two) times daily. 05/14/19   [provider]  metoprolol succinate (TOPROL-XL) 50 MG 24 hr tablet Take 2 tablets (100 mg total) by mouth daily. Take 1 tablet (50 mg) by mouth once daily. Take with or immediately following a meal. 11/11/19   Merlyn Lot, MD  nortriptyline (PAMELOR) 10 MG capsule Take 10 mg by mouth daily. 10/02/19   [provider]  Omega-3 Fatty Acids (FISH OIL) 875 MG CAPS Take 1 capsule by mouth 2 (two)  times daily.    [provider]  Pramipexole Dihydrochloride 1.5 MG TB24 Take by mouth daily.  11/22/17   [provider]  rosuvastatin (CRESTOR) 20 MG tablet Take 1 tablet (20 mg total) by mouth daily. 09/04/19 12/03/19  [name], MD  spironolactone (ALDACTONE) 25 MG tablet Take 12.5 mg by mouth daily.  05/21/19   [provider]  traZODone (DESYREL) 50 MG tablet  10/01/19   [provider]    Allergies Entresto [sacubitril-valsartan], Lipitor [atorvastatin], and Pregabalin    Social History Social History   Tobacco Use  . Smoking status: Never Smoker  . Smokeless tobacco: Never Used  Substance Use Topics  . Alcohol use: Not Currently    Comment: rare beer  . Drug use: No    Review of Systems Patient denies headaches, rhinorrhea, blurry vision, numbness, shortness of breath, chest pain, edema, cough, abdominal pain, nausea, vomiting, diarrhea, dysuria, fevers, rashes or hallucinations unless otherwise stated above in HPI. ____________________________________________   PHYSICAL EXAM:  VITAL SIGNS: Vitals:   11/11/19 1406 11/11/19 1407  BP:  129/70  Pulse: 87 86  Resp: 18 (!) 26  Temp:    SpO2: 100% 100%    Constitutional: Alert and oriented.  Eyes: Conjunctivae are normal.  Head: Atraumatic. Nose: No congestion/rhinnorhea. Mouth/Throat: Mucous membranes are moist.   Neck: No stridor. Painless ROM.  Cardiovascular: tachycardic rate, regular rhythm. Grossly normal heart sounds.  Good peripheral circulation. Respiratory: Normal respiratory effort.  No retractions. Lungs CTAB. Gastrointestinal: Soft and nontender. No distention. No abdominal bruits. No CVA tenderness. Genitourinary:  Musculoskeletal: No lower extremity tenderness nor edema.  No joint effusions. Neurologic:  Normal speech and language. No gross focal neurologic deficits are appreciated. No facial droop Skin:  Skin is warm, dry and intact. No rash  noted. Psychiatric: Mood and affect are normal. Speech and behavior are normal.  ____________________________________________   LABS (all labs ordered are listed, but only abnormal results are displayed)  Results for orders placed or performed during the hospital encounter of 11/11/19 (from the past 24 hour(s))  Basic metabolic panel     Status: Abnormal   Collection Time: 11/11/19  1:18 PM  Result Value Ref Range   Sodium 137 135 - 145 mmol/L   Potassium 3.7 3.5 - 5.1 mmol/L   Chloride 99 98 - 111 mmol/L   CO2 26 22 - 32 mmol/L   Glucose, Bld 170 (H) 70 - 99 mg/dL   BUN 23 (H) 6 - 20 mg/dL   Creatinine, Ser 1.42 (H) 0.61 - 1.24 mg/dL   Calcium 9.0 8.9 - 10.3 mg/dL   GFR calc non Af Amer 54 (L) >60 mL/min   GFR calc Af Amer >60 >60  mL/min   Anion gap 12 5 - 15  CBC     Status: Abnormal   Collection Time: 11/11/19  1:18 PM  Result Value Ref Range   WBC 10.6 (H) 4.0 - 10.5 K/uL   RBC 5.14 4.22 - 5.81 MIL/uL   Hemoglobin 15.6 13.0 - 17.0 g/dL   HCT 44.7 39.0 - 52.0 %   MCV 87.0 80.0 - 100.0 fL   MCH 30.4 26.0 - 34.0 pg   MCHC 34.9 30.0 - 36.0 g/dL   RDW 12.0 11.5 - 15.5 %   Platelets 223 150 - 400 K/uL   nRBC 0.0 0.0 - 0.2 %  Troponin I (High Sensitivity)     Status: None   Collection Time: 11/11/19  1:18 PM  Result Value Ref Range   Troponin I (High Sensitivity) 8 <18 ng/L   ____________________________________________  EKG My review and personal interpretation at Time: 13:11   Indication: palpitations  Rate: 140  Rhythm: svt Axis: normal Other: rbbb, nonspecific st abn ____________________________________________  RADIOLOGY  I personally reviewed all radiographic images ordered to evaluate for the above acute complaints and reviewed radiology reports and findings.  These findings were personally discussed with the patient.  Please see medical record for radiology report.  ____________________________________________   PROCEDURES  Procedure(s) performed:   .Critical Care Performed by: Merlyn Lot, MD Authorized by: Merlyn Lot, MD   Critical care provider statement:    Critical care time (minutes):  35   Critical care time was exclusive of:  Separately billable procedures and treating other patients   Critical care was time spent personally by me on the following activities:  Development of treatment plan with patient or surrogate, discussions with consultants, evaluation of patient's response to treatment, examination of patient, obtaining history from patient or surrogate, ordering and performing treatments and interventions, ordering and review of laboratory studies, ordering and review of radiographic studies, pulse oximetry, re-evaluation of patient's condition and review of old charts      Critical Care performed: yes ____________________________________________   INITIAL IMPRESSION / ASSESSMENT AND PLAN / ED COURSE  Pertinent labs & imaging results that were available during my care of the patient were reviewed by me and considered in my medical decision making (see chart for details).   DDX: Dysrhythmia, SVT, ACS, CHF, dehydration  Ricardo Brown is a 59 y.o. who presents to the ED with evidence of SVT.  Patient with extensive cardiac history.  Patient placed on monitor and pacer pads.  The patient will be placed on continuous pulse oximetry and telemetry for monitoring.  Laboratory evaluation will be sent to evaluate for the above complaints.     Clinical Course as of Nov 11 1511  Sat Nov 11, 2019  1357 6 mg of adenosine was given without effect.   [PR]    Clinical Course User Index [PR] Merlyn Lot, MD   Patient converted after 12 mg adenosine.  Was given Lopressor IV after.  I did discuss with Dr. Pricilla Holm of cardiology regarding further recommendations.  Patient's currently pain-free.  This not consistent with ACS.  No evidence of congestive heart failure.  We will increase his Toprol-XL to 100  daily.  Have discussed with the patient and available family all diagnostics and treatments performed thus far and all questions were answered to the best of my ability. The patient demonstrates understanding and agreement with plan.   The patient was evaluated in Emergency Department today for the symptoms described in the history  of present illness. He/she was evaluated in the context of the global COVID-19 pandemic, which necessitated consideration that the patient might be at risk for infection with the SARS-CoV-2 virus that causes COVID-19. Institutional protocols and algorithms that pertain to the evaluation of patients at risk for COVID-19 are in a state of rapid change based on information released by regulatory bodies including the CDC and federal and state organizations. These policies and algorithms were followed during the patient's care in the ED.  As part of my medical decision making, I reviewed the following data within the [place] notes reviewed and incorporated, Labs reviewed, notes from prior ED visits and [place] Controlled Substance Database   ____________________________________________   FINAL CLINICAL IMPRESSION(S) / ED DIAGNOSES  Final diagnoses:  SVT (supraventricular tachycardia) ([place])      NEW MEDICATIONS STARTED DURING THIS VISIT:  Current Discharge Medication List       Note:  This document was prepared using Dragon voice recognition software and may include unintentional dictation errors.    Merlyn Lot, MD 11/11/19 [phone]

## 2019-11-13 DIAGNOSIS — G629 Polyneuropathy, unspecified: Secondary | ICD-10-CM | POA: Diagnosis not present

## 2019-11-13 DIAGNOSIS — Z8673 Personal history of transient ischemic attack (TIA), and cerebral infarction without residual deficits: Secondary | ICD-10-CM | POA: Diagnosis not present

## 2019-11-13 DIAGNOSIS — G479 Sleep disorder, unspecified: Secondary | ICD-10-CM | POA: Diagnosis not present

## 2019-11-13 DIAGNOSIS — R2689 Other abnormalities of gait and mobility: Secondary | ICD-10-CM | POA: Diagnosis not present

## 2019-11-21 ENCOUNTER — Other Ambulatory Visit: Payer: Self-pay | Admitting: Neurology

## 2019-11-21 DIAGNOSIS — Z8673 Personal history of transient ischemic attack (TIA), and cerebral infarction without residual deficits: Secondary | ICD-10-CM

## 2019-11-24 ENCOUNTER — Encounter: Payer: Self-pay | Admitting: Dietician

## 2019-11-24 NOTE — Progress Notes (Signed)
Have not heard back from patient to reschedule his missed appointment from 10/27/19. Sent notification to referring provider.

## 2019-11-29 DIAGNOSIS — R002 Palpitations: Secondary | ICD-10-CM | POA: Diagnosis not present

## 2019-11-29 DIAGNOSIS — I471 Supraventricular tachycardia: Secondary | ICD-10-CM | POA: Diagnosis not present

## 2019-11-29 DIAGNOSIS — G629 Polyneuropathy, unspecified: Secondary | ICD-10-CM | POA: Diagnosis not present

## 2019-11-29 DIAGNOSIS — E119 Type 2 diabetes mellitus without complications: Secondary | ICD-10-CM | POA: Diagnosis not present

## 2019-12-02 ENCOUNTER — Ambulatory Visit
Admission: RE | Admit: 2019-12-02 | Discharge: 2019-12-02 | Disposition: A | Discharge status: Home / Self Care | Payer: BC Managed Care – PPO | Source: Ambulatory Visit | Attending: Neurology | Admitting: Neurology

## 2019-12-02 ENCOUNTER — Other Ambulatory Visit: Payer: Self-pay

## 2019-12-02 DIAGNOSIS — Z8673 Personal history of transient ischemic attack (TIA), and cerebral infarction without residual deficits: Secondary | ICD-10-CM

## 2019-12-02 DIAGNOSIS — R42 Dizziness and giddiness: Secondary | ICD-10-CM | POA: Diagnosis not present

## 2019-12-06 DIAGNOSIS — H3582 Retinal ischemia: Secondary | ICD-10-CM | POA: Diagnosis not present

## 2019-12-06 DIAGNOSIS — E113513 Type 2 diabetes mellitus with proliferative diabetic retinopathy with macular edema, bilateral: Secondary | ICD-10-CM | POA: Diagnosis not present

## 2019-12-06 DIAGNOSIS — H35372 Puckering of macula, left eye: Secondary | ICD-10-CM | POA: Diagnosis not present

## 2019-12-07 DIAGNOSIS — E1140 Type 2 diabetes mellitus with diabetic neuropathy, unspecified: Secondary | ICD-10-CM | POA: Diagnosis not present

## 2019-12-07 DIAGNOSIS — E669 Obesity, unspecified: Secondary | ICD-10-CM | POA: Diagnosis not present

## 2019-12-07 DIAGNOSIS — E7800 Pure hypercholesterolemia, unspecified: Secondary | ICD-10-CM | POA: Diagnosis not present

## 2019-12-07 DIAGNOSIS — I10 Essential (primary) hypertension: Secondary | ICD-10-CM | POA: Diagnosis not present

## 2019-12-18 ENCOUNTER — Other Ambulatory Visit: Payer: Self-pay | Admitting: *Deleted

## 2019-12-18 MED ORDER — CETIRIZINE HCL 10 MG PO TABS: 10.0000 mg | ORAL_TABLET | Freq: Every day | ORAL | 6 refills | Status: DC

## 2019-12-21 ENCOUNTER — Telehealth: Payer: Self-pay | Admitting: Internal Medicine

## 2019-12-21 MED ORDER — METOPROLOL SUCCINATE ER 50 MG PO TB24: 100.0000 mg | ORAL_TABLET | Freq: Every day | ORAL | 0 refills | Status: DC

## 2019-12-21 MED ORDER — METOPROLOL SUCCINATE ER 50 MG PO TB24: 100.0000 mg | ORAL_TABLET | Freq: Every day | ORAL | 6 refills | Status: DC

## 2019-12-21 NOTE — Telephone Encounter (Signed)
rx has been sent to pharmacy , patient aware.

## 2019-12-21 NOTE — Telephone Encounter (Signed)
Patient needs a script for metoporol 100 mg hospital increased now he is out of medicine.

## 2019-12-21 NOTE — Addendum Note (Signed)
Addended by: Alois Cliche on: 12/21/2019 12:14 PM   Modules accepted: Orders

## 2019-12-26 ENCOUNTER — Other Ambulatory Visit: Payer: Self-pay | Admitting: *Deleted

## 2019-12-26 MED ORDER — CETIRIZINE HCL 10 MG PO TABS: 10.0000 mg | ORAL_TABLET | Freq: Every day | ORAL | 6 refills | Status: DC

## 2019-12-26 MED ORDER — FUROSEMIDE 20 MG PO TABS: 40.0000 mg | ORAL_TABLET | Freq: Two times a day (BID) | ORAL | 6 refills | Status: DC

## 2019-12-28 ENCOUNTER — Ambulatory Visit: Payer: BC Managed Care – PPO | Admitting: Internal Medicine

## 2019-12-28 ENCOUNTER — Other Ambulatory Visit: Payer: Self-pay

## 2020-01-03 ENCOUNTER — Telehealth: Payer: Self-pay

## 2020-01-03 NOTE — Telephone Encounter (Signed)
Called patient and left him a voicemail letting him know that he is needing to schedule a colonoscopy since his last once showed a piecemeal polypectomy site in the transverse colon. Dr. Fontaine No would like to do another colonoscopy soon. Awaiting on his call.

## 2020-01-05 NOTE — Telephone Encounter (Signed)
Called patient again and had to leave him another voicemail.

## 2020-01-08 ENCOUNTER — Encounter: Payer: Self-pay | Admitting: Internal Medicine

## 2020-01-08 ENCOUNTER — Ambulatory Visit: Payer: BC Managed Care – PPO | Admitting: Internal Medicine

## 2020-01-08 ENCOUNTER — Other Ambulatory Visit: Payer: Self-pay

## 2020-01-08 VITALS — BP 106/64 | HR 75 | Wt 197.7 lb

## 2020-01-08 DIAGNOSIS — R42 Dizziness and giddiness: Secondary | ICD-10-CM | POA: Diagnosis not present

## 2020-01-08 DIAGNOSIS — I10 Essential (primary) hypertension: Secondary | ICD-10-CM

## 2020-01-08 DIAGNOSIS — I471 Supraventricular tachycardia: Secondary | ICD-10-CM

## 2020-01-08 DIAGNOSIS — I25810 Atherosclerosis of coronary artery bypass graft(s) without angina pectoris: Secondary | ICD-10-CM | POA: Diagnosis not present

## 2020-01-08 DIAGNOSIS — E119 Type 2 diabetes mellitus without complications: Secondary | ICD-10-CM

## 2020-01-08 MED ORDER — FUROSEMIDE 20 MG PO TABS: 40.0000 mg | ORAL_TABLET | Freq: Two times a day (BID) | ORAL | 6 refills | Status: DC

## 2020-01-08 NOTE — Progress Notes (Signed)
Patient ID: Ricardo Brown, male   DOB: 01-22-1961, 59 y.o.   MRN: [id number]    Established Patient Office Visit  Subjective:  Patient ID: Ricardo Brown, male    DOB: 03-03-1961  Age: 59 y.o. MRN: [id number]  CC:  Chief Complaint  Patient presents with   Dizziness    patient has been haveing episodes of vertigo for over a month and is requesting referral to ENT    Diabetes    patient's blood sugar was 120 when checked at home     HPI  Ricardo Brown presents for episodes of dizziness and loss of balance. He would like a referral to ENT. The episodes are worse if he is out in the heat and often last 30 minutes to 1 hour. During the episodes, he becomes sensitive to light and sometimes becomes nauseous and vomits. He has a bump on the top of his head from an accident from when he was a child, and when he puts pressure on the area, it provides him with relief. The patient denies chest pain. The patient is taking all of his medications as prescribed. He has diabetes and his blood sugar was 120 this morning. He takes Trulicity once per week. He has a history of a mitral valve repair and bypass surgery. His memory is "not as good as it used to be" and he is not working at this time.   12/02/2019 MR Brain w/o contrast revealed: "No acute abnormality Few small white matter hyperintensities consistent with mild chronic microvascular ischemia. Multiple foci of chronic microhemorrhage in the brain bilaterally suggesting poorly controlled hypertension. Electronically Signed   By: Franchot Gallo M.D.   On: 12/03/2019 19:38"  Past Medical History:  Diagnosis Date   CHF (congestive heart failure) ([place])    Coronary artery disease    Diabetes mellitus without complication ([place])    a. Dx ~ 2000.   Heart palpitations    a. Pt reports prior nl echo's and stress tests. Last stress test w/in past 2 yrs - PCP.   High cholesterol    Hypertension    Myocardial  infarction [facility])     Past Surgical History:  Procedure Laterality Date   CARDIAC CATHETERIZATION     CARDIAC VALVE REPLACEMENT     Mitral Valve Repair   COLONOSCOPY WITH PROPOFOL N/A 06/07/2019   Procedure: COLONOSCOPY WITH PROPOFOL;  Surgeon: [name], MD;  Location: [facility] ENDOSCOPY;  Service: Endoscopy;  Laterality: N/A;   CORONARY ARTERY BYPASS GRAFT     x 3   01/2018   LEFT HEART CATH AND CORONARY ANGIOGRAPHY N/A 01/24/2018   Procedure: LEFT HEART CATH AND CORONARY ANGIOGRAPHY;  Surgeon: [name], MD;  Location: [place] CV LAB;  Service: Cardiovascular;  Laterality: N/A;   TONSILLECTOMY      Family History  Problem Relation Age of Onset   Cancer Mother        died @ 15   CAD Father        First MI @ 13. S/p heart transplant. Died in mid-50's of cancer.   Cancer Father    Other Brother        alive and well    Social History   Socioeconomic History   Marital status: Married    Spouse name: Not on file   Number of children: Not on file   Years of education: Not on file   Highest education level: Not on file  Occupational History   Not on file  Tobacco Use   Smoking status: Never Smoker   Smokeless tobacco: Never Used  Substance and Sexual Activity   Alcohol use: Not Currently    Comment: rare beer   Drug use: No   Sexual activity: Not on file  Other Topics Concern   Not on file  Social History Narrative   Lives locally with wife.  Works in [employer].  Does not routinely exercise.   Social Determinants of Health   Financial Resource Strain:    Difficulty of Paying Living Expenses:   Food Insecurity:    Worried About Charity fundraiser in the Last Year:    Arboriculturist in the Last Year:   Transportation Needs:    Film/video editor (Medical):    Lack of Transportation (Non-Medical):   Physical Activity:    Days of Exercise per Week:    Minutes of Exercise per Session:   Stress:    Feeling  of Stress :   Social Connections:    Frequency of Communication with Friends and Family:    Frequency of Social Gatherings with Friends and Family:    Attends Religious Services:    Active Member of Clubs or Organizations:    Attends Music therapist:    Marital Status:   Intimate Partner Violence:    Fear of Current or Ex-Partner:    Emotionally Abused:    Physically Abused:    Sexually Abused:      Current Outpatient Medications:    amiodarone (PACERONE) 200 MG tablet, 200 mg daily. , Disp: , Rfl:    aspirin 81 MG EC tablet, Take 81 mg by mouth daily. , Disp: , Rfl:    b complex vitamins tablet, Take 1 tablet by mouth daily. , Disp: , Rfl:    cetirizine (ZYRTEC) 10 MG tablet, Take 1 tablet (10 mg total) by mouth daily., Disp: 30 tablet, Rfl: 6   Dulaglutide 3 MG/0.5ML SOPN, Inject into the skin once a week. , Disp: , Rfl:    empagliflozin (JARDIANCE) 25 MG TABS tablet, Take 25 mg by mouth. , Disp: , Rfl:    furosemide (LASIX) 20 MG tablet, Take 2 tablets (40 mg total) by mouth 2 (two) times daily., Disp: 120 tablet, Rfl: 6   metFORMIN (GLUCOPHAGE) 500 MG tablet, Take 1,000 mg by mouth 2 (two) times daily., Disp: , Rfl:    metoprolol succinate (TOPROL-XL) 50 MG 24 hr tablet, Take 2 tablets (100 mg total) by mouth daily. Take 1 tablet (50 mg) by mouth once daily. Take with or immediately following a meal., Disp: 60 tablet, Rfl: 6   nortriptyline (PAMELOR) 10 MG capsule, Take 10 mg by mouth daily., Disp: , Rfl:    Omega-3 Fatty Acids (FISH OIL) 875 MG CAPS, Take 1 capsule by mouth 2 (two) times daily., Disp: , Rfl:    Pramipexole Dihydrochloride 1.5 MG TB24, Take by mouth daily. , Disp: , Rfl:    spironolactone (ALDACTONE) 25 MG tablet, Take 12.5 mg by mouth daily. , Disp: , Rfl:    traZODone (DESYREL) 50 MG tablet, , Disp: , Rfl:    losartan (COZAAR) 100 MG tablet, Take 1 tablet (100 mg total) by mouth daily., Disp: 90 tablet, Rfl: 3    rosuvastatin (CRESTOR) 20 MG tablet, Take 1 tablet (20 mg total) by mouth daily., Disp: 90 tablet, Rfl: 3   Allergies  Allergen Reactions   Entresto [Sacubitril-Valsartan] Other (See Comments)  SVT   Lipitor [Atorvastatin] Palpitations    SVT   Pregabalin Other (See Comments)    ROS Review of Systems  Constitutional: Negative.   HENT: Negative.   Eyes: Positive for photophobia.  Respiratory: Negative.   Cardiovascular: Negative.  Negative for chest pain.  Gastrointestinal: Positive for nausea and vomiting.  Endocrine: Negative.   Genitourinary: Negative.   Musculoskeletal: Negative.   Skin: Negative.   Allergic/Immunologic: Negative.   Neurological: Positive for dizziness.  Hematological: Negative.   Psychiatric/Behavioral: Negative.       Objective:    Physical Exam  Constitutional: The patient is oriented to person, place, and time. Pt appears well-developed and well-nourished.  Head: Normocephalic and atraumatic.  Eyes: Pupils are equal, round, and reactive to light.  Neck: No JVD present. No tracheal deviation present. No thyromegaly present. No bruit.  Cardiovascular: Regular rate and rhythm. No gallop. Pulmonary/Chest: Normal breath sounds. Lungs clear to auscultation. Abdominal: No abdominal tenderness. No guarding or rebound tenderness.. Musculoskeletal: Normal range of motion.  Lymphatic: No cervical adenopathy.  Neurological: No cranial nerve deficit. Skin: Skin is warm and hydrated.  Psychiatric: The patient has a normal mood and affect.  BP 106/64    Pulse 75    Wt 197 lb 11.2 oz (89.7 kg)    BMI 30.06 kg/m  Wt Readings from Last 3 Encounters:  01/08/20 197 lb 11.2 oz (89.7 kg)  11/11/19 210 lb (95.3 kg)  10/16/19 213 lb (96.6 kg)     Health Maintenance Due  Topic Date Due   HEMOGLOBIN A1C  Never done   Hepatitis C Screening  Never done   PNEUMOCOCCAL POLYSACCHARIDE VACCINE AGE 59-64 HIGH RISK  Never done   FOOT EXAM  Never done    OPHTHALMOLOGY EXAM  Never done   URINE MICROALBUMIN  Never done   COVID-19 Vaccine (1) Never done   HIV Screening  Never done   TETANUS/TDAP  Never done    There are no preventive care reminders to display for this patient.  Lab Results  Component Value Date   TSH 2.162 04/23/2015   Lab Results  Component Value Date   WBC 10.6 (H) 11/11/2019   HGB 15.6 11/11/2019   HCT 44.7 11/11/2019   MCV 87.0 11/11/2019   PLT 223 11/11/2019   Lab Results  Component Value Date   NA 137 11/11/2019   K 3.7 11/11/2019   CO2 26 11/11/2019   GLUCOSE 170 (H) 11/11/2019   BUN 23 (H) 11/11/2019   CREATININE 1.42 (H) 11/11/2019   BILITOT 1.1 01/24/2018   ALKPHOS 29 (L) 01/24/2018   AST 40 01/24/2018   ALT 30 01/24/2018   PROT 7.8 01/24/2018   ALBUMIN 3.8 01/24/2018   CALCIUM 9.0 11/11/2019   ANIONGAP 12 11/11/2019   Lab Results  Component Value Date   CHOL 163 01/24/2018   Lab Results  Component Value Date   HDL 23 (L) 01/24/2018   Lab Results  Component Value Date   LDLCALC 116 (H) 01/24/2018   Lab Results  Component Value Date   TRIG 120 01/24/2018   Lab Results  Component Value Date   CHOLHDL 7.1 01/24/2018   No results found for: HGBA1C    Assessment & Plan:   Problem List Items Addressed This Visit      Cardiovascular and Mediastinum   Hypertension   Relevant Medications   furosemide (LASIX) 20 MG tablet   PSVT (paroxysmal supraventricular tachycardia) (HCC)   Relevant Medications   furosemide (LASIX) 20  MG tablet   Coronary artery disease   Relevant Medications   furosemide (LASIX) 20 MG tablet     Endocrine   Diabetes mellitus type 2, uncomplicated ([place])    Other Visit Diagnoses    Vertigo    -  Primary   Relevant Orders   Ambulatory referral to ENT      Meds ordered this encounter  Medications   furosemide (LASIX) 20 MG tablet    Sig: Take 2 tablets (40 mg total) by mouth 2 (two) times daily.    Dispense:  120 tablet    Refill:  6   1.  Vertigo Patient has a vertigo off-and-on for the last several months is getting worse he is seeing a neurologist and MRI was done which did not reveal any focal lesion , want to see a ENT specialist will make a referral for that. - Ambulatory referral to ENT  2. Type 2 diabetes mellitus without complication, without long-term current use of insulin (HCC) Blood sugar 120 this morning patient is taking Trulicity.  3. Essential hypertension Pressure is under control is kind of low so I asked him to drink a lot of fluid to keep you hydrated.  4. Coronary artery disease involving coronary bypass graft of native heart without angina pector No angina of the present   5. PSVT (paroxysmal supraventricular tachycardia) (HCC) No episodes of tachycardia in the last few weeks  Follow-up: Return in about 2 months (around 03/09/2020).   By signing my name below, I, De Burrs, attest that this documentation has been prepared under the direction and in the presence of [name], MD. Electronically Signed: De Burrs, Medical Scribe. 01/08/20. 2:41 PM.  I personally performed the services described in this documentation, which was SCRIBED in my presence. The recorded information has been reviewed and considered accurate. It has been edited as necessary during review. [name], MD

## 2020-01-08 NOTE — Telephone Encounter (Signed)
Patient called back and states he is ready to scheduled the colonoscopy

## 2020-01-09 NOTE — Telephone Encounter (Signed)
Called patient again and had to leave him another voicemail to return my call.

## 2020-01-09 NOTE — Telephone Encounter (Signed)
Called patient again and left him another voicemail to return my call.

## 2020-01-11 NOTE — Telephone Encounter (Signed)
Patient called and left a voicemail wanting to scheduled colonoscopy. Call him at [phone]

## 2020-01-12 ENCOUNTER — Other Ambulatory Visit: Payer: Self-pay

## 2020-01-12 ENCOUNTER — Telehealth: Payer: Self-pay

## 2020-01-12 DIAGNOSIS — R1111 Vomiting without nausea: Secondary | ICD-10-CM

## 2020-01-12 DIAGNOSIS — Z1211 Encounter for screening for malignant neoplasm of colon: Secondary | ICD-10-CM

## 2020-01-12 MED ORDER — NA SULFATE-K SULFATE-MG SULF 17.5-3.13-1.6 GM/177ML PO SOLN: ORAL | 0 refills | Status: DC

## 2020-01-12 NOTE — Telephone Encounter (Signed)
Called patient and he was finally available to schedule his colonoscopy and he agreed on having it done on 02/07/2020 at [facility]. Patient also stated that he had been having some vomiting and acid reflux. I told him that I would have to ask Dr. Bonna Gains if I am able to add an EGD. Waiting on her response to go ahead and schedule everything. Dr. Bonna Gains replied back and she approved.

## 2020-02-06 ENCOUNTER — Other Ambulatory Visit: Payer: Self-pay

## 2020-02-06 ENCOUNTER — Encounter: Payer: Self-pay | Admitting: Gastroenterology

## 2020-02-06 ENCOUNTER — Other Ambulatory Visit
Admission: RE | Admit: 2020-02-06 | Discharge: 2020-02-06 | Disposition: A | Discharge status: Home / Self Care | Payer: BC Managed Care – PPO | Source: Ambulatory Visit | Attending: Gastroenterology | Admitting: Gastroenterology

## 2020-02-06 DIAGNOSIS — Z20822 Contact with and (suspected) exposure to covid-19: Secondary | ICD-10-CM | POA: Diagnosis not present

## 2020-02-06 DIAGNOSIS — Z01812 Encounter for preprocedural laboratory examination: Secondary | ICD-10-CM | POA: Insufficient documentation

## 2020-02-06 LAB — SARS CORONAVIRUS 2 (TAT 6-24 HRS): SARS Coronavirus 2: NEGATIVE

## 2020-02-06 MED ORDER — METFORMIN HCL 500 MG PO TABS: 500.0000 mg | ORAL_TABLET | Freq: Two times a day (BID) | ORAL | 3 refills | Status: DC

## 2020-02-07 ENCOUNTER — Ambulatory Visit: Payer: BC Managed Care – PPO | Admitting: Anesthesiology

## 2020-02-07 ENCOUNTER — Encounter: Payer: Self-pay | Admitting: Gastroenterology

## 2020-02-07 ENCOUNTER — Encounter
Admission: RE | Disposition: A | Discharge status: Home / Self Care | Payer: Self-pay | Source: Home / Self Care | Attending: Gastroenterology

## 2020-02-07 ENCOUNTER — Ambulatory Visit
Admission: RE | Admit: 2020-02-07 | Discharge: 2020-02-07 | Disposition: A | Discharge status: Home / Self Care | Payer: BC Managed Care – PPO | Attending: Gastroenterology | Admitting: Gastroenterology

## 2020-02-07 ENCOUNTER — Other Ambulatory Visit: Payer: Self-pay

## 2020-02-07 DIAGNOSIS — Z951 Presence of aortocoronary bypass graft: Secondary | ICD-10-CM | POA: Diagnosis not present

## 2020-02-07 DIAGNOSIS — D175 Benign lipomatous neoplasm of intra-abdominal organs: Secondary | ICD-10-CM

## 2020-02-07 DIAGNOSIS — D123 Benign neoplasm of transverse colon: Secondary | ICD-10-CM | POA: Insufficient documentation

## 2020-02-07 DIAGNOSIS — E119 Type 2 diabetes mellitus without complications: Secondary | ICD-10-CM | POA: Diagnosis not present

## 2020-02-07 DIAGNOSIS — D125 Benign neoplasm of sigmoid colon: Secondary | ICD-10-CM | POA: Insufficient documentation

## 2020-02-07 DIAGNOSIS — K635 Polyp of colon: Secondary | ICD-10-CM

## 2020-02-07 DIAGNOSIS — Z888 Allergy status to other drugs, medicaments and biological substances status: Secondary | ICD-10-CM | POA: Diagnosis not present

## 2020-02-07 DIAGNOSIS — R1111 Vomiting without nausea: Secondary | ICD-10-CM

## 2020-02-07 DIAGNOSIS — Z8249 Family history of ischemic heart disease and other diseases of the circulatory system: Secondary | ICD-10-CM | POA: Diagnosis not present

## 2020-02-07 DIAGNOSIS — Z86010 Personal history of colonic polyps: Secondary | ICD-10-CM | POA: Insufficient documentation

## 2020-02-07 DIAGNOSIS — Z952 Presence of prosthetic heart valve: Secondary | ICD-10-CM | POA: Insufficient documentation

## 2020-02-07 DIAGNOSIS — I509 Heart failure, unspecified: Secondary | ICD-10-CM | POA: Diagnosis not present

## 2020-02-07 DIAGNOSIS — K219 Gastro-esophageal reflux disease without esophagitis: Secondary | ICD-10-CM | POA: Insufficient documentation

## 2020-02-07 DIAGNOSIS — I2510 Atherosclerotic heart disease of native coronary artery without angina pectoris: Secondary | ICD-10-CM | POA: Insufficient documentation

## 2020-02-07 DIAGNOSIS — Z79899 Other long term (current) drug therapy: Secondary | ICD-10-CM | POA: Insufficient documentation

## 2020-02-07 DIAGNOSIS — K228 Other specified diseases of esophagus: Secondary | ICD-10-CM

## 2020-02-07 DIAGNOSIS — I252 Old myocardial infarction: Secondary | ICD-10-CM | POA: Diagnosis not present

## 2020-02-07 DIAGNOSIS — R002 Palpitations: Secondary | ICD-10-CM | POA: Diagnosis not present

## 2020-02-07 DIAGNOSIS — I110 Hypertensive heart disease with heart failure: Secondary | ICD-10-CM | POA: Insufficient documentation

## 2020-02-07 DIAGNOSIS — Z1211 Encounter for screening for malignant neoplasm of colon: Secondary | ICD-10-CM | POA: Diagnosis not present

## 2020-02-07 DIAGNOSIS — E7800 Pure hypercholesterolemia, unspecified: Secondary | ICD-10-CM | POA: Insufficient documentation

## 2020-02-07 DIAGNOSIS — Z809 Family history of malignant neoplasm, unspecified: Secondary | ICD-10-CM | POA: Insufficient documentation

## 2020-02-07 DIAGNOSIS — Q438 Other specified congenital malformations of intestine: Secondary | ICD-10-CM | POA: Diagnosis not present

## 2020-02-07 HISTORY — PX: COLONOSCOPY WITH PROPOFOL: SHX5780

## 2020-02-07 HISTORY — PX: ESOPHAGOGASTRODUODENOSCOPY: SHX5428

## 2020-02-07 LAB — GLUCOSE, CAPILLARY: Glucose-Capillary: 99 mg/dL (ref 70–99)

## 2020-02-07 SURGERY — COLONOSCOPY WITH PROPOFOL
Anesthesia: General

## 2020-02-07 MED ORDER — METFORMIN HCL 500 MG PO TABS: 1000.0000 mg | ORAL_TABLET | Freq: Two times a day (BID) | ORAL | 3 refills | Status: DC

## 2020-02-07 MED ORDER — OMEPRAZOLE 20 MG PO CPDR
20.0000 mg | DELAYED_RELEASE_CAPSULE | Freq: Every day | ORAL | 0 refills | Status: DC
Start: 2020-02-07 — End: 2020-08-06

## 2020-02-07 MED ORDER — GLYCOPYRROLATE 0.2 MG/ML IJ SOLN
INTRAMUSCULAR | Status: DC | PRN
  Administered 2020-02-07: .2 mg via INTRAVENOUS

## 2020-02-07 MED ORDER — LIDOCAINE 2% (20 MG/ML) 5 ML SYRINGE
INTRAMUSCULAR | Status: DC | PRN
  Administered 2020-02-07: 25 mg via INTRAVENOUS

## 2020-02-07 MED ORDER — PROPOFOL 500 MG/50ML IV EMUL
INTRAVENOUS | Status: AC
  Filled 2020-02-07: qty 100

## 2020-02-07 MED ORDER — GLYCOPYRROLATE 0.2 MG/ML IJ SOLN
INTRAMUSCULAR | Status: AC
  Filled 2020-02-07: qty 1

## 2020-02-07 MED ORDER — SODIUM CHLORIDE 0.9 % IV SOLN
INTRAVENOUS | Status: DC
  Administered 2020-02-07: 20 mL/h via INTRAVENOUS

## 2020-02-07 MED ORDER — MIDAZOLAM HCL 2 MG/2ML IJ SOLN
INTRAMUSCULAR | Status: AC
  Filled 2020-02-07: qty 2

## 2020-02-07 MED ORDER — PROPOFOL 10 MG/ML IV BOLUS
INTRAVENOUS | Status: DC | PRN
  Administered 2020-02-07: 70 mg via INTRAVENOUS
  Administered 2020-02-07: 30 mg via INTRAVENOUS

## 2020-02-07 MED ORDER — MIDAZOLAM HCL 5 MG/5ML IJ SOLN
INTRAMUSCULAR | Status: DC | PRN
  Administered 2020-02-07: 2 mg via INTRAVENOUS

## 2020-02-07 MED ORDER — PROPOFOL 500 MG/50ML IV EMUL
INTRAVENOUS | Status: DC | PRN
  Administered 2020-02-07: 120 ug/kg/min via INTRAVENOUS

## 2020-02-07 NOTE — Transfer of Care (Signed)
Immediate Anesthesia Transfer of Care Note  Patient: [name]  Procedure(s) Performed: COLONOSCOPY WITH PROPOFOL (N/A ) ESOPHAGOGASTRODUODENOSCOPY (EGD) (N/A )  Patient Location: Endoscopy Unit  Anesthesia Type:General  Level of Consciousness: awake  Airway & Oxygen Therapy: Patient Spontanous Breathing and Patient connected to nasal cannula oxygen  Post-op Assessment: Report given to RN and Post -op Vital signs reviewed and stable  Post vital signs: Reviewed  Last Vitals:  Vitals Value Taken Time  BP 86/51 02/07/20 1107  Temp 36.3 C 02/07/20 1104  Pulse 68 02/07/20 1107  Resp 14 02/07/20 1107  SpO2 99 % 02/07/20 1107    Last Pain:  Vitals:   02/07/20 1104  TempSrc: Temporal  PainSc: Asleep         Complications: No complications documented.

## 2020-02-07 NOTE — Addendum Note (Signed)
Addended by: Alois Cliche on: 02/07/2020 10:23 AM   Modules accepted: Orders

## 2020-02-07 NOTE — Anesthesia Preprocedure Evaluation (Addendum)
Anesthesia Evaluation  Patient identified by MRN, date of birth, ID band Patient awake    Reviewed: Allergy & Precautions, NPO status , Patient's Chart, lab work & pertinent test results, reviewed documented beta blocker date and time   Airway Mallampati: II       Dental  (+) Upper Dentures, Lower Dentures   Pulmonary neg pulmonary ROS,    Pulmonary exam normal        Cardiovascular hypertension, Pt. on medications and Pt. on home beta blockers + CAD, + Past MI and +CHF  Normal cardiovascular exam     Neuro/Psych negative neurological ROS  negative psych ROS   GI/Hepatic negative GI ROS, Neg liver ROS,   Endo/Other  diabetes  Renal/GU negative Renal ROS  negative genitourinary   Musculoskeletal negative musculoskeletal ROS (+)   Abdominal Normal abdominal exam  (+)   Peds negative pediatric ROS (+)  Hematology negative hematology ROS (+)   Anesthesia Other Findings Past Medical History: No date: Diabetes mellitus without complication ([place])     Comment:  a. Dx ~ 2000. No date: Heart palpitations     Comment:  a. Pt reports prior nl echo's and stress tests. Last               stress test w/in past 2 yrs - PCP. No date: High cholesterol EF 35-40%  Reproductive/Obstetrics                             Anesthesia Physical  Anesthesia Plan  ASA: III  Anesthesia Plan: General   Post-op Pain Management:    Induction: Intravenous  PONV Risk Score and Plan: TIVA  Airway Management Planned: Nasal Cannula  Additional Equipment:   Intra-op Plan:   Post-operative Plan:   Informed Consent: I have reviewed the patients History and Physical, chart, labs and discussed the procedure including the risks, benefits and alternatives for the proposed anesthesia with the patient or authorized representative who has indicated his/her understanding and acceptance.     Dental advisory  given  Plan Discussed with: CRNA and Surgeon  Anesthesia Plan Comments:         Anesthesia Quick Evaluation

## 2020-02-07 NOTE — H&P (Signed)
[name], MD [address], [place], [place], [place], [address] [address], [place], [place], [place], [address] Phone: [phone]  Fax: [phone]  Primary Care Physician:  [name], MD   Pre-Procedure History & Physical: HPI:  Ricardo Brown is a 59 y.o. male is here for a colonoscopy and EGD.   Past Medical History:  Diagnosis Date  . CHF (congestive heart failure) ([place])   . Coronary artery disease   . Diabetes mellitus without complication ([place])    a. Dx ~ 2000.  [name] Heart palpitations    a. Pt reports prior nl echo's and stress tests. Last stress test w/in past 2 yrs - PCP.  [name] High cholesterol   . Hypertension   . Myocardial infarction [facility])     Past Surgical History:  Procedure Laterality Date  . CARDIAC CATHETERIZATION    . CARDIAC VALVE REPLACEMENT     Mitral Valve Repair  . COLONOSCOPY WITH PROPOFOL N/A 06/07/2019   Procedure: COLONOSCOPY WITH PROPOFOL;  Surgeon: [name], MD;  Location: [facility] ENDOSCOPY;  Service: Endoscopy;  Laterality: N/A;  . CORONARY ANGIOPLASTY    . CORONARY ARTERY BYPASS GRAFT     x 3   01/2018  . LEFT HEART CATH AND CORONARY ANGIOGRAPHY N/A 01/24/2018   Procedure: LEFT HEART CATH AND CORONARY ANGIOGRAPHY;  Surgeon: [name], MD;  Location: [facility];  Service: Cardiovascular;  Laterality: N/A;  . TONSILLECTOMY      Prior to Admission medications   Medication Sig Start Date End Date Taking? Authorizing Provider  amiodarone (PACERONE) 200 MG tablet 200 mg daily.  05/21/19  Yes [provider]  aspirin 81 MG EC tablet Take 81 mg by mouth daily.    Yes [provider]  b complex vitamins tablet Take 1 tablet by mouth daily.    Yes [provider]  cetirizine (ZYRTEC) 10 MG tablet Take 1 tablet (10 mg total) by mouth daily. 12/26/19  Yes Masoud, Viann Shove, MD  Dulaglutide 3 MG/0.5ML SOPN Inject into the skin once a week.  03/30/18  Yes [provider]   empagliflozin (JARDIANCE) 25 MG TABS tablet Take 25 mg by mouth.  08/20/19 08/19/20 Yes [provider]  furosemide (LASIX) 20 MG tablet Take 2 tablets (40 mg total) by mouth 2 (two) times daily. 01/08/20  Yes Masoud, Viann Shove, MD  metFORMIN (GLUCOPHAGE) 500 MG tablet Take 1 tablet (500 mg total) by mouth 2 (two) times daily. 02/06/20  Yes Masoud, Viann Shove, MD  metoprolol succinate (TOPROL-XL) 50 MG 24 hr tablet Take 2 tablets (100 mg total) by mouth daily. Take 1 tablet (50 mg) by mouth once daily. Take with or immediately following a meal. 12/21/19  Yes Masoud, Viann Shove, MD  Na Sulfate-K Sulfate-Mg Sulf 17.5-3.13-1.6 GM/177ML SOLN At 5 PM the day before your procedure take 1 bottle with 8 oz water and continue drinking fluids. Take 1 bottle 5 hours before procedure with 8 oz water. 01/12/20  Yes [name] B, MD  nortriptyline (PAMELOR) 10 MG capsule Take 10 mg by mouth daily. 10/02/19  Yes [provider]  Omega-3 Fatty Acids (FISH OIL) 875 MG CAPS Take 1 capsule by mouth 2 (two) times daily.   Yes [provider]  Pramipexole Dihydrochloride 1.5 MG TB24 Take by mouth daily.  11/22/17  Yes [provider]  spironolactone (ALDACTONE) 25 MG tablet Take 12.5 mg by mouth daily.  05/21/19  Yes [provider]  traZODone (DESYREL) 50 MG  tablet  10/01/19  Yes [provider]  losartan (COZAAR) 100 MG tablet Take 1 tablet (100 mg total) by mouth daily. 09/04/19 12/03/19  [name], MD  rosuvastatin (CRESTOR) 20 MG tablet Take 1 tablet (20 mg total) by mouth daily. 09/04/19 12/03/19  [name], MD    Allergies as of 01/12/2020 - Review Complete 01/08/2020  Allergen Reaction Noted  . Entresto [sacubitril-valsartan] Other (See Comments) 06/07/2019  . Lipitor [atorvastatin] Palpitations 04/23/2015  . Pregabalin Other (See Comments) 10/02/2019    Family History  Problem Relation Age of Onset  . Cancer Mother        died @ 63  . CAD Father         First MI @ 26. S/p heart transplant. Died in mid-50's of cancer.  . Cancer Father   . Other Brother        alive and well    Social History   Socioeconomic History  . Marital status: Married    Spouse name: Not on file  . Number of children: Not on file  . Years of education: Not on file  . Highest education level: Not on file  Occupational History  . Not on file  Tobacco Use  . Smoking status: Never Smoker  . Smokeless tobacco: Never Used  Vaping Use  . Vaping Use: Never used  Substance and Sexual Activity  . Alcohol use: Not Currently    Comment: rare beer  . Drug use: No  . Sexual activity: Not on file  Other Topics Concern  . Not on file  Social History Narrative   Lives locally with wife.  Works in [employer].  Does not routinely exercise.   Social Determinants of Health   Financial Resource Strain:   . Difficulty of Paying Living Expenses:   Food Insecurity:   . Worried About Charity fundraiser in the Last Year:   . Arboriculturist in the Last Year:   Transportation Needs:   . Film/video editor (Medical):   [name] Lack of Transportation (Non-Medical):   Physical Activity:   . Days of Exercise per Week:   . Minutes of Exercise per Session:   Stress:   . Feeling of Stress :   Social Connections:   . Frequency of Communication with Friends and Family:   . Frequency of Social Gatherings with Friends and Family:   . Attends Religious Services:   . Active Member of Clubs or Organizations:   . Attends Archivist Meetings:   [name] Marital Status:   Intimate Partner Violence:   . Fear of Current or Ex-Partner:   . Emotionally Abused:   [name] Physically Abused:   . Sexually Abused:     Review of Systems: See HPI, otherwise negative ROS  Physical Exam: BP 121/70   Pulse 85   Temp (!) 96.3 F (35.7 C) (Temporal)   Resp 20   Ht 5\' 8"  (1.727 m)   Wt 93 kg   SpO2 100%   BMI 31.17 kg/m  General:   Alert,  pleasant and cooperative in NAD Head:   Normocephalic and atraumatic. Neck:  Supple; no masses or thyromegaly. Lungs:  Clear throughout to auscultation, normal respiratory effort.    Heart:  +S1, +S2, Regular rate and rhythm, No edema. Abdomen:  Soft, nontender and nondistended. Normal bowel sounds, without guarding, and without rebound.   Neurologic:  Alert and  oriented x4;  grossly normal neurologically.  Impression/Plan: Ricardo Brown  is here for a colonoscopy to be performed for history of polyps and EGD for Acid Reflux, vomiting, abdominal bloating.  Risks, benefits, limitations, and alternatives regarding the procedures have been reviewed with the patient.  Questions have been answered.  All parties agreeable.   [name], MD  02/07/2020, 9:18 AM

## 2020-02-07 NOTE — Op Note (Signed)
[facility] Gastroenterology Patient Name: Ricardo Brown Procedure Date: 02/07/2020 10:04 AM MRN: [id number] Account #: [id number] Date of Birth: 10/10/1960 Admit Type: Outpatient Age: 59 Room: [facility] ENDO ROOM 1 Gender: Male Note Status: Finalized Procedure:             Upper GI endoscopy Indications:           Heartburn, Vomiting Providers:             Vianney Kopecky B. Bonna Gains MD, MD Referring MD:          [name], MD (Referring MD) Medicines:             Monitored Anesthesia Care Complications:         No immediate complications. Procedure:             Pre-Anesthesia Assessment:                        - The risks and benefits of the procedure and the                         sedation options and risks were discussed with the                         patient. All questions were answered and informed                         consent was obtained.                        - Patient identification and proposed procedure were                         verified prior to the procedure.                        - ASA Grade Assessment: II - A patient with mild                         systemic disease.                        After obtaining informed consent, the endoscope was                         passed under direct vision. Throughout the procedure,                         the patient's blood pressure, pulse, and oxygen                         saturations were monitored continuously. The Endoscope                         was introduced through the mouth, and advanced to the                         second part of duodenum. The upper GI endoscopy was                         accomplished with ease.  The patient tolerated the                         procedure well. Findings:      One tongue of salmon-colored mucosa was present. No other visible       abnormalities were present. Biopsies were taken with a cold forceps for       histology.      The exam of the esophagus was  otherwise normal.      The entire examined stomach was normal. Biopsies were taken with a cold       forceps for Helicobacter pylori testing.      Patchy mucosal changes characterized by discoloration were found in the       second portion of the duodenum. Biopsies were taken with a cold forceps       for histology.      Patchy mucosal changes characterized by polypoid appearance were found       in the duodenal bulb. Biopsies were taken with a cold forceps for       histology. Impression:            - Salmon-colored mucosa suspicious for short-segment                         Barrett's esophagus. Biopsied.                        - Normal stomach. Biopsied.                        - Mucosal changes in the duodenum. Biopsied.                        - Mucosal changes in the duodenum. Biopsied. Recommendation:        - Await pathology results.                        - Discharge patient to home.                        - Continue present medications.                        - Resume previous diet.                        - The findings and recommendations were discussed with                         the patient.                        - The findings and recommendations were discussed with                         the patient's family.                        - Return to primary care physician as previously                         scheduled.                        -  Return to my office in 2 weeks. Procedure Code(s):     --- Professional ---                        [phone], Esophagogastroduodenoscopy, flexible,                         transoral; with biopsy, single or multiple Diagnosis Code(s):     --- Professional ---                        K22.8, Other specified diseases of esophagus                        K31.89, Other diseases of stomach and duodenum                        R12, Heartburn                        R11.10, Vomiting, unspecified CPT copyright 2019 American Medical Association. All  rights reserved. The codes documented in this report are preliminary and upon coder review may  be revised to meet current compliance requirements.  [name], MD Margretta Sidle B. Bonna Gains MD, MD 02/07/2020 10:21:54 AM This report has been signed electronically. Number of Addenda: 0 Note Initiated On: 02/07/2020 10:04 AM Estimated Blood Loss:  Estimated blood loss: none.      [facility]

## 2020-02-07 NOTE — Op Note (Signed)
[facility] Gastroenterology Patient Name: Ricardo Brown Procedure Date: 02/07/2020 10:03 AM MRN: [id number] Account #: [id number] Date of Birth: 05-06-1961 Admit Type: Outpatient Age: 59 Room: [facility] ENDO ROOM 1 Gender: Male Note Status: Finalized Procedure:             Colonoscopy Indications:           High risk colon cancer surveillance: Personal history                         of colonic polyps Providers:             Elizar Alpern B. Bonna Gains MD, MD Referring MD:          [name], MD (Referring MD) Medicines:             Monitored Anesthesia Care Complications:         No immediate complications. Procedure:             Pre-Anesthesia Assessment:                        - ASA Grade Assessment: II - A patient with mild                         systemic disease.                        - Prior to the procedure, a History and Physical was                         performed, and patient medications, allergies and                         sensitivities were reviewed. The patient's tolerance                         of previous anesthesia was reviewed.                        - The risks and benefits of the procedure and the                         sedation options and risks were discussed with the                         patient. All questions were answered and informed                         consent was obtained.                        - Patient identification and proposed procedure were                         verified prior to the procedure by the physician, the                         nurse, the anesthesiologist, the anesthetist and the                         technician. The procedure was  verified in the                         procedure room.                        After obtaining informed consent, the colonoscope was                         passed under direct vision. Throughout the procedure,                         the patient's blood pressure, pulse, and  oxygen                         saturations were monitored continuously. The                         Colonoscope was introduced through the anus and                         advanced to the the terminal ileum. The colonoscopy                         was performed with ease. The patient tolerated the                         procedure well. The quality of the bowel preparation                         was poor. Findings:      The perianal and digital rectal examinations were normal.      A 17 mm polyp was found in the transverse colon. The polyp was flat.       Area was successfully injected with 4 mL Eleview for lesion assessment,       and this injection appeared to lift the lesion adequately. Polypectomy       was attempted, initially using a hot snare. Polyp resection was       incomplete with this device. This intervention then required a different       device and polypectomy technique. The polyp was removed with a cold       snare. Resection and retrieval were complete. This polyp was found near       the previously placed tattoo site.      There was a lipoma, in the transverse colon. Biopsies were taken with a       cold forceps for histology.      A 6 mm polyp was found in the sigmoid colon. The polyp was sessile. The       polyp was removed with a cold snare. Resection and retrieval were       complete.      The exam was otherwise without abnormality.      The retroflexed view of the distal rectum and anal verge was normal and       showed no anal or rectal abnormalities. Impression:            - Preparation of the colon was poor.                        -  One 17 mm polyp in the transverse colon, removed                         with a cold snare. Resected and retrieved. Injected.                        - Lipoma in the transverse colon. Biopsied.                        - One 6 mm polyp in the sigmoid colon, removed with a                         cold snare. Resected and retrieved.                         - The examination was otherwise normal.                        - The distal rectum and anal verge are normal on                         retroflexion view. Recommendation:        - Await pathology results.                        - Discharge patient to home (with escort).                        - Advance diet as tolerated.                        - Continue present medications.                        - Repeat colonoscopy within 3-6 months with 2 day prep.                        - The findings and recommendations were discussed with                         the patient.                        - The findings and recommendations were discussed with                         the patient's family.                        - Return to primary care physician as previously                         scheduled. Procedure Code(s):     --- Professional ---                        [phone], Colonoscopy, flexible; with removal of                         tumor(s), polyp(s), or other lesion(s) by snare  technique                        [id number], Colonoscopy, flexible; with directed submucosal                         injection(s), any substance                        [id number], 59, Colonoscopy, flexible; with biopsy, single                         or multiple Diagnosis Code(s):     --- Professional ---                        Z86.010, Personal history of colonic polyps                        K63.5, Polyp of colon                        D17.5, Benign lipomatous neoplasm of intra-abdominal                         organs CPT copyright 2019 American Medical Association. All rights reserved. The codes documented in this report are preliminary and upon coder review may  be revised to meet current compliance requirements.  [name], MD Margretta Sidle B. Bonna Gains MD, MD 02/07/2020 11:09:41 AM This report has been signed electronically. Number of Addenda: 0 Note Initiated On: 02/07/2020  10:03 AM Scope Withdrawal Time: 0 hours 28 minutes 28 seconds  Total Procedure Duration: 0 hours 35 minutes 27 seconds  Estimated Blood Loss:  Estimated blood loss: none.      [facility]

## 2020-02-08 ENCOUNTER — Encounter: Payer: Self-pay | Admitting: Gastroenterology

## 2020-02-08 LAB — SURGICAL PATHOLOGY

## 2020-02-08 NOTE — Anesthesia Postprocedure Evaluation (Signed)
Anesthesia Post Note  Patient: [name]  Procedure(s) Performed: COLONOSCOPY WITH PROPOFOL (N/A ) ESOPHAGOGASTRODUODENOSCOPY (EGD) (N/A )  Patient location during evaluation: Endoscopy Anesthesia Type: General Level of consciousness: awake and alert and oriented Pain management: pain level controlled Vital Signs Assessment: post-procedure vital signs reviewed and stable Respiratory status: spontaneous breathing Cardiovascular status: blood pressure returned to baseline Anesthetic complications: no   No complications documented.   Last Vitals:  Vitals:   02/07/20 1124 02/07/20 1134  BP: 102/71 117/65  Pulse: 77 75  Resp: (!) 7 18  Temp:    SpO2: 100% 100%    Last Pain:  Vitals:   02/08/20 0728  TempSrc:   PainSc: 0-No pain                 Timmy Cleverly

## 2020-02-12 DIAGNOSIS — H2512 Age-related nuclear cataract, left eye: Secondary | ICD-10-CM | POA: Diagnosis not present

## 2020-02-14 ENCOUNTER — Ambulatory Visit (INDEPENDENT_AMBULATORY_CARE_PROVIDER_SITE_OTHER): Payer: BC Managed Care – PPO | Admitting: Otolaryngology

## 2020-02-21 DIAGNOSIS — H3582 Retinal ischemia: Secondary | ICD-10-CM | POA: Diagnosis not present

## 2020-02-21 DIAGNOSIS — H35372 Puckering of macula, left eye: Secondary | ICD-10-CM | POA: Diagnosis not present

## 2020-02-21 DIAGNOSIS — H2513 Age-related nuclear cataract, bilateral: Secondary | ICD-10-CM | POA: Diagnosis not present

## 2020-02-21 DIAGNOSIS — E113513 Type 2 diabetes mellitus with proliferative diabetic retinopathy with macular edema, bilateral: Secondary | ICD-10-CM | POA: Diagnosis not present

## 2020-03-11 DIAGNOSIS — E1140 Type 2 diabetes mellitus with diabetic neuropathy, unspecified: Secondary | ICD-10-CM | POA: Diagnosis not present

## 2020-03-11 DIAGNOSIS — E7800 Pure hypercholesterolemia, unspecified: Secondary | ICD-10-CM | POA: Diagnosis not present

## 2020-03-11 DIAGNOSIS — G629 Polyneuropathy, unspecified: Secondary | ICD-10-CM | POA: Diagnosis not present

## 2020-03-11 DIAGNOSIS — I10 Essential (primary) hypertension: Secondary | ICD-10-CM | POA: Diagnosis not present

## 2020-03-13 ENCOUNTER — Other Ambulatory Visit: Payer: Self-pay

## 2020-03-13 MED ORDER — AMIODARONE HCL 200 MG PO TABS: 200.0000 mg | ORAL_TABLET | Freq: Every day | ORAL | 2 refills | Status: DC

## 2020-03-14 ENCOUNTER — Ambulatory Visit: Payer: BC Managed Care – PPO | Admitting: Internal Medicine

## 2020-03-15 ENCOUNTER — Other Ambulatory Visit: Payer: Self-pay

## 2020-03-15 ENCOUNTER — Ambulatory Visit (INDEPENDENT_AMBULATORY_CARE_PROVIDER_SITE_OTHER): Payer: BC Managed Care – PPO | Admitting: Internal Medicine

## 2020-03-15 ENCOUNTER — Encounter: Payer: Self-pay | Admitting: Internal Medicine

## 2020-03-15 VITALS — BP 103/64 | HR 83 | Ht 68.0 in | Wt 196.6 lb

## 2020-03-15 DIAGNOSIS — R42 Dizziness and giddiness: Secondary | ICD-10-CM | POA: Diagnosis not present

## 2020-03-15 DIAGNOSIS — E119 Type 2 diabetes mellitus without complications: Secondary | ICD-10-CM | POA: Diagnosis not present

## 2020-03-15 DIAGNOSIS — E7800 Pure hypercholesterolemia, unspecified: Secondary | ICD-10-CM

## 2020-03-15 DIAGNOSIS — I25810 Atherosclerosis of coronary artery bypass graft(s) without angina pectoris: Secondary | ICD-10-CM | POA: Diagnosis not present

## 2020-03-15 LAB — GLUCOSE, POCT (MANUAL RESULT ENTRY): POC Glucose: 142 mg/dl — AB (ref 70–99)

## 2020-03-15 NOTE — Assessment & Plan Note (Signed)
Coronary artery disease is stable patient denies any history of palpitation denies any chest pain.  No swelling of the legs.  Pain is rated under control.  D Mellitus is also under control.

## 2020-03-15 NOTE — Assessment & Plan Note (Signed)
Vertigo is a major problem it comes on suddenly it may be related to low blood pressure but on today's examination blood pressure was found to be 827M systolic so I asked him to reduce his Lasix to 20 mg p.o. daily.  Also has autonomic neuropathy. it  may be contributing to his dizziness.  Patient is considered totally disabled.

## 2020-03-15 NOTE — Assessment & Plan Note (Signed)
Patient is taking Crestor 40 mg p.o. daily

## 2020-03-15 NOTE — Progress Notes (Signed)
Established Patient Office Visit  Subjective:  Patient ID: Ricardo Brown, male    DOB: [birth year]  Age: 59 y.o. MRN: [id number]  CC:  Chief Complaint  Patient presents with   Diabetes    2 month follow up, patient had a A1C done recently and states he was told it was 6.6    Diabetes He presents for his follow-up diabetic visit. He has type 2 diabetes mellitus. His disease course has been improving. Hypoglycemia symptoms include dizziness. Pertinent negatives for hypoglycemia include no headaches, mood changes or nervousness/anxiousness. Associated symptoms include blurred vision and weakness. Pertinent negatives for diabetes include no chest pain, no fatigue and no foot ulcerations. Diabetic complications include autonomic neuropathy, heart disease and peripheral neuropathy. Pertinent negatives for diabetic complications include no CVA. Current diabetic treatment includes insulin injections and diet.    Ricardo Brown presents for diabetes  Follow up  Past Medical History:  Diagnosis Date   CHF (congestive heart failure) ([place])    Coronary artery disease    Diabetes mellitus without complication ([place])    a. Dx ~ 2000.   Heart palpitations    a. Pt reports prior nl echo's and stress tests. Last stress test w/in past 2 yrs - PCP.   High cholesterol    Hypertension    Myocardial infarction [facility])     Past Surgical History:  Procedure Laterality Date   CARDIAC CATHETERIZATION     CARDIAC VALVE REPLACEMENT     Mitral Valve Repair   COLONOSCOPY WITH PROPOFOL N/A 06/07/2019   Procedure: COLONOSCOPY WITH PROPOFOL;  Surgeon: [name], MD;  Location: [facility] ENDOSCOPY;  Service: Endoscopy;  Laterality: N/A;   COLONOSCOPY WITH PROPOFOL N/A 02/07/2020   Procedure: COLONOSCOPY WITH PROPOFOL;  Surgeon: [name], MD;  Location: [facility] ENDOSCOPY;  Service: Endoscopy;  Laterality: N/A;   CORONARY ANGIOPLASTY     CORONARY ARTERY BYPASS GRAFT       x 3   01/2018   ESOPHAGOGASTRODUODENOSCOPY N/A 02/07/2020   Procedure: ESOPHAGOGASTRODUODENOSCOPY (EGD);  Surgeon: [name], MD;  Location: [facility] ENDOSCOPY;  Service: Endoscopy;  Laterality: N/A;   LEFT HEART CATH AND CORONARY ANGIOGRAPHY N/A 01/24/2018   Procedure: LEFT HEART CATH AND CORONARY ANGIOGRAPHY;  Surgeon: [name], MD;  Location: [place] CV LAB;  Service: Cardiovascular;  Laterality: N/A;   TONSILLECTOMY      Family History  Problem Relation Age of Onset   Cancer Mother        died @ 75   CAD Father        First MI @ 74. S/p heart transplant. Died in mid-50's of cancer.   Cancer Father    Other Brother        alive and well    Social History   Socioeconomic History   Marital status: Married    Spouse name: Not on file   Number of children: Not on file   Years of education: Not on file   Highest education level: Not on file  Occupational History   Not on file  Tobacco Use   Smoking status: Never Smoker   Smokeless tobacco: Never Used  Vaping Use   Vaping Use: Never used  Substance and Sexual Activity   Alcohol use: Not Currently    Comment: rare beer   Drug use: No   Sexual activity: Not on file  Other Topics Concern   Not on file  Social History Narrative   Lives locally  with wife.  Works in [employer].  Does not routinely exercise.   Social Determinants of Health   Financial Resource Strain:    Difficulty of Paying Living Expenses:   Food Insecurity:    Worried About Charity fundraiser in the Last Year:    Arboriculturist in the Last Year:   Transportation Needs:    Film/video editor (Medical):    Lack of Transportation (Non-Medical):   Physical Activity:    Days of Exercise per Week:    Minutes of Exercise per Session:   Stress:    Feeling of Stress :   Social Connections:    Frequency of Communication with Friends and Family:    Frequency of Social Gatherings with Friends and  Family:    Attends Religious Services:    Active Member of Clubs or Organizations:    Attends Music therapist:    Marital Status:   Intimate Partner Violence:    Fear of Current or Ex-Partner:    Emotionally Abused:    Physically Abused:    Sexually Abused:      Current Outpatient Medications:    amiodarone (PACERONE) 200 MG tablet, Take 1 tablet (200 mg total) by mouth daily., Disp: 90 tablet, Rfl: 2   aspirin 81 MG EC tablet, Take 81 mg by mouth daily. , Disp: , Rfl:    b complex vitamins tablet, Take 1 tablet by mouth daily. , Disp: , Rfl:    cetirizine (ZYRTEC) 10 MG tablet, Take 1 tablet (10 mg total) by mouth daily., Disp: 30 tablet, Rfl: 6   Dulaglutide 3 MG/0.5ML SOPN, Inject into the skin once a week. , Disp: , Rfl:    empagliflozin (JARDIANCE) 25 MG TABS tablet, Take 25 mg by mouth. , Disp: , Rfl:    furosemide (LASIX) 20 MG tablet, Take 2 tablets (40 mg total) by mouth 2 (two) times daily., Disp: 120 tablet, Rfl: 6   metFORMIN (GLUCOPHAGE) 500 MG tablet, Take 2 tablets (1,000 mg total) by mouth in the morning and at bedtime., Disp: 180 tablet, Rfl: 3   metoprolol succinate (TOPROL-XL) 50 MG 24 hr tablet, Take 2 tablets (100 mg total) by mouth daily. Take 1 tablet (50 mg) by mouth once daily. Take with or immediately following a meal., Disp: 60 tablet, Rfl: 6   Na Sulfate-K Sulfate-Mg Sulf 17.5-3.13-1.6 GM/177ML SOLN, At 5 PM the day before your procedure take 1 bottle with 8 oz water and continue drinking fluids. Take 1 bottle 5 hours before procedure with 8 oz water., Disp: 354 mL, Rfl: 0   nortriptyline (PAMELOR) 10 MG capsule, Take 10 mg by mouth daily., Disp: , Rfl:    Omega-3 Fatty Acids (FISH OIL) 875 MG CAPS, Take 1 capsule by mouth 2 (two) times daily., Disp: , Rfl:    Pramipexole Dihydrochloride 1.5 MG TB24, Take by mouth daily. , Disp: , Rfl:    spironolactone (ALDACTONE) 25 MG tablet, Take 12.5 mg by mouth daily. , Disp: , Rfl:     traZODone (DESYREL) 50 MG tablet, , Disp: , Rfl:    losartan (COZAAR) 100 MG tablet, Take 1 tablet (100 mg total) by mouth daily., Disp: 90 tablet, Rfl: 3   omeprazole (PRILOSEC) 20 MG capsule, Take 1 capsule (20 mg total) by mouth daily., Disp: 30 capsule, Rfl: 0   rosuvastatin (CRESTOR) 20 MG tablet, Take 1 tablet (20 mg total) by mouth daily., Disp: 90 tablet, Rfl: 3   Allergies  Allergen  Reactions   Entresto [Sacubitril-Valsartan] Other (See Comments)    SVT   Lipitor [Atorvastatin] Palpitations    SVT   Pregabalin Other (See Comments)    ROS Review of Systems  Constitutional: Negative for fatigue.  HENT: Negative for congestion.   Eyes: Positive for blurred vision. Negative for pain.  Respiratory: Negative for choking.   Cardiovascular: Negative for chest pain.  Genitourinary: Positive for dysuria.  Musculoskeletal: Positive for arthralgias.  Neurological: Positive for dizziness and weakness. Negative for headaches.  Psychiatric/Behavioral: The patient is not nervous/anxious.       Objective:    Physical Exam Vitals reviewed.  Constitutional:      Appearance: Normal appearance.  HENT:     Mouth/Throat:     Mouth: Mucous membranes are moist.  Eyes:     Pupils: Pupils are equal, round, and reactive to light.  Neck:     Vascular: No carotid bruit.  Cardiovascular:     Rate and Rhythm: Normal rate and regular rhythm.     Pulses: Normal pulses.     Heart sounds: Normal heart sounds.  Pulmonary:     Effort: Pulmonary effort is normal.     Breath sounds: Normal breath sounds.  Abdominal:     General: Bowel sounds are normal.     Palpations: Abdomen is soft. There is no hepatomegaly, splenomegaly or mass.     Tenderness: There is no abdominal tenderness.     Hernia: No hernia is present.  Musculoskeletal:     Cervical back: Neck supple.     Right lower leg: No edema.     Left lower leg: No edema.  Skin:    Findings: No rash.  Neurological:     Mental  Status: He is alert and oriented to person, place, and time.     Motor: No weakness.  Psychiatric:        Mood and Affect: Mood normal.        Behavior: Behavior normal.     BP 103/64    Pulse 83    Ht 5\' 8"  (1.727 m)    Wt 196 lb 9.6 oz (89.2 kg)    BMI 29.89 kg/m  Wt Readings from Last 3 Encounters:  03/15/20 196 lb 9.6 oz (89.2 kg)  02/07/20 205 lb (93 kg)  01/08/20 197 lb 11.2 oz (89.7 kg)     Health Maintenance Due  Topic Date Due   HEMOGLOBIN A1C  Never done   Hepatitis C Screening  Never done   PNEUMOCOCCAL POLYSACCHARIDE VACCINE AGE 68-64 HIGH RISK  Never done   FOOT EXAM  Never done   OPHTHALMOLOGY EXAM  Never done   URINE MICROALBUMIN  Never done   COVID-19 Vaccine (1) Never done   HIV Screening  Never done   TETANUS/TDAP  Never done   INFLUENZA VACCINE  03/03/2020    There are no preventive care reminders to display for this patient.  Lab Results  Component Value Date   TSH 2.162 04/23/2015   Lab Results  Component Value Date   WBC 10.6 (H) 11/11/2019   HGB 15.6 11/11/2019   HCT 44.7 11/11/2019   MCV 87.0 11/11/2019   PLT 223 11/11/2019   Lab Results  Component Value Date   NA 137 11/11/2019   K 3.7 11/11/2019   CO2 26 11/11/2019   GLUCOSE 170 (H) 11/11/2019   BUN 23 (H) 11/11/2019   CREATININE 1.42 (H) 11/11/2019   BILITOT 1.1 01/24/2018   ALKPHOS 29 (L) 01/24/2018  AST 40 01/24/2018   ALT 30 01/24/2018   PROT 7.8 01/24/2018   ALBUMIN 3.8 01/24/2018   CALCIUM 9.0 11/11/2019   ANIONGAP 12 11/11/2019   Lab Results  Component Value Date   CHOL 163 01/24/2018   Lab Results  Component Value Date   HDL 23 (L) 01/24/2018   Lab Results  Component Value Date   LDLCALC 116 (H) 01/24/2018   Lab Results  Component Value Date   TRIG 120 01/24/2018   Lab Results  Component Value Date   CHOLHDL 7.1 01/24/2018   No results found for: HGBA1C    Assessment & Plan:   Problem List Items Addressed This Visit      Cardiovascular  and Mediastinum   Coronary artery disease    Coronary artery disease is stable patient denies any history of palpitation denies any chest pain.  No swelling of the legs.  Pain is rated under control.  D Mellitus is also under control.        Endocrine   Diabetes mellitus type 2, uncomplicated ([place]) - Primary    Patient blood sugar is under control on Metformin 500 mg twice a day.  He also take Jardiance 25 mg       Relevant Orders   POCT glucose (manual entry) (Completed)     Other   HLD (hyperlipidemia)    Patient is taking Crestor 40 mg p.o. daily      Vertigo    Vertigo is a major problem it comes on suddenly it may be related to low blood pressure  on today's examination blood pressure was found to be 473U systolic so I asked him to reduce his Lasix to 20 mg p.o. daily.  Also he has autonomic neuropathy. it  may be contributing to his dizziness.  Patient is considered totally disabled.         No orders of the defined types were placed in this encounter.   Follow-up: No follow-ups on file.    [name], MD

## 2020-03-15 NOTE — Assessment & Plan Note (Signed)
Patient blood sugar is under control on Metformin 500 mg twice a day.  He also take Jardiance 25 mg

## 2020-04-11 IMAGING — DX DG CHEST 1V PORT
1 series · 1 of 1 positions shown · non-contrast
Comparison: 11/11/2018

CLINICAL DATA: Chest pain tech cardia

EXAM:
PORTABLE CHEST 1 VIEW

[chest ap]
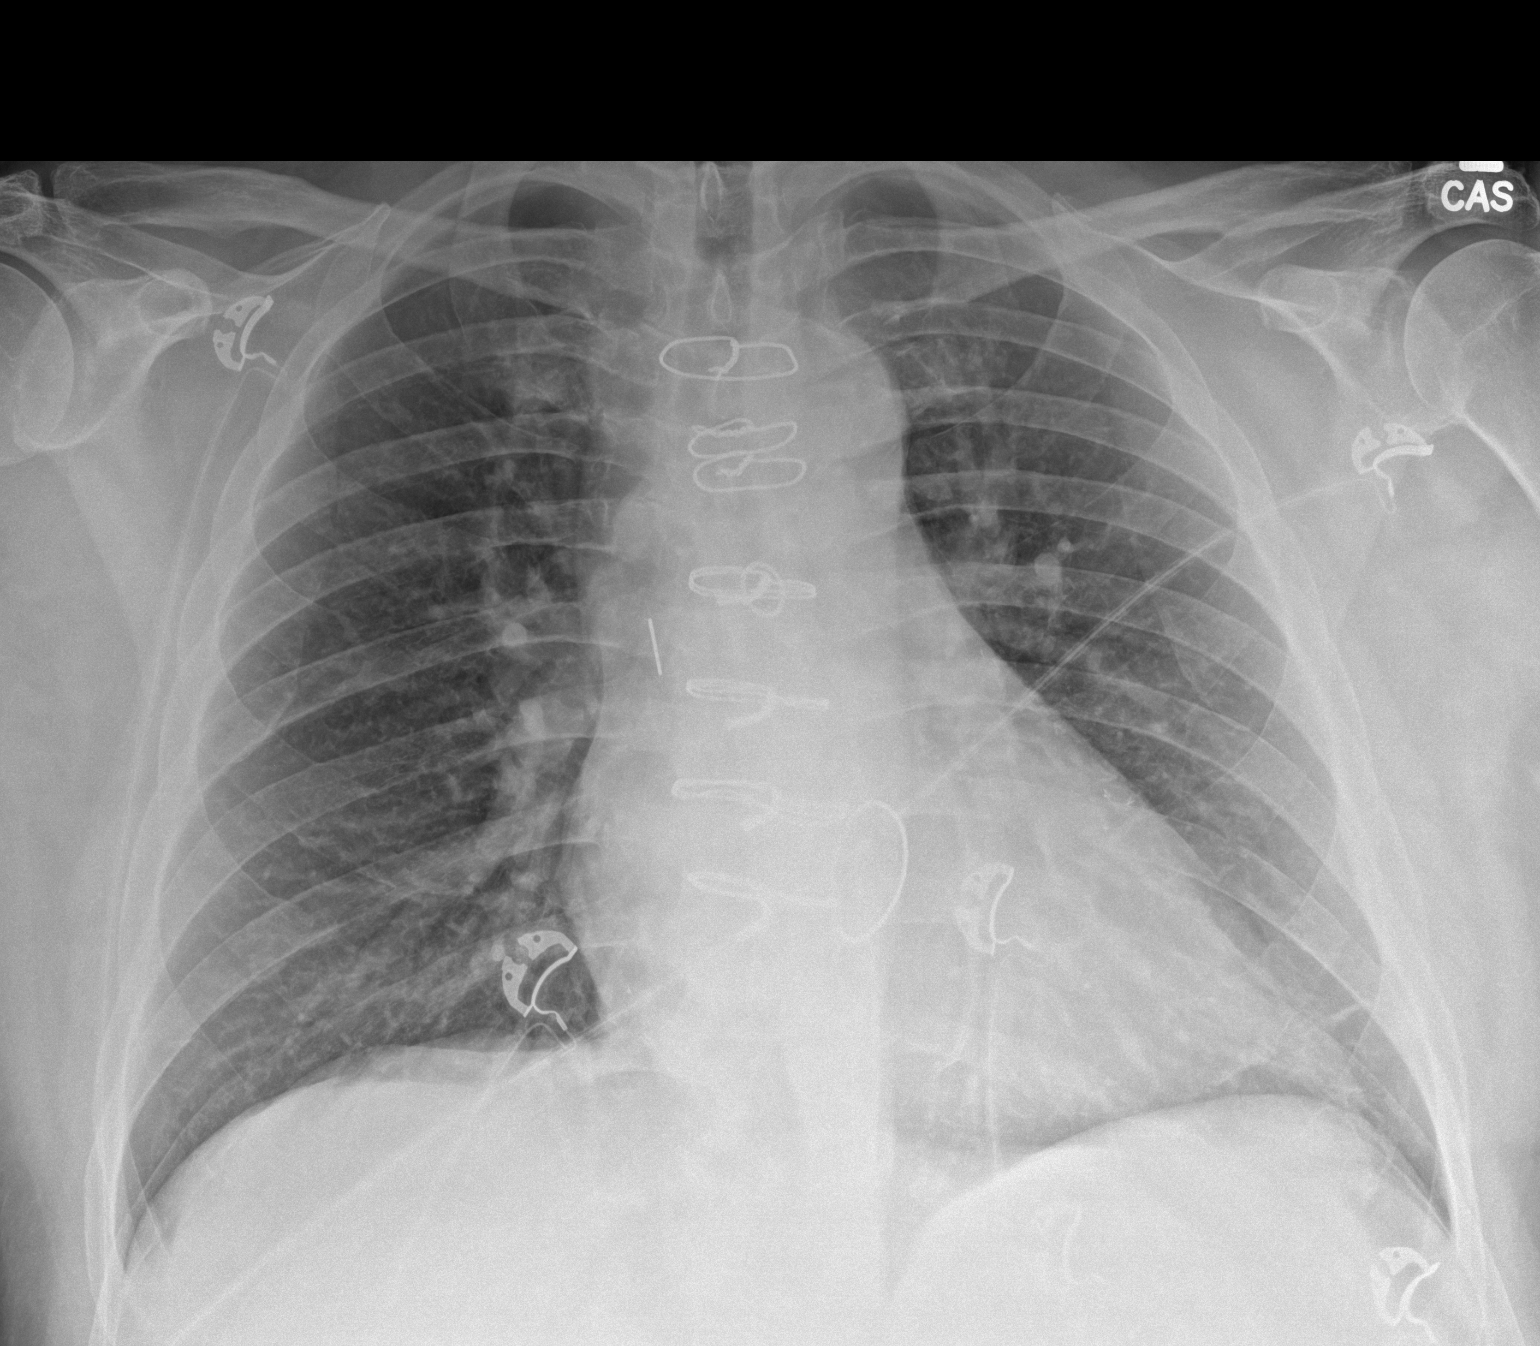

[1 of 1 positions shown; findings below may reference images not displayed]

FINDINGS: Post median sternotomy with valvular repair of mitral valve. Heart
size mildly enlarged.

Scarring or atelectasis unchanged and left lung base. No sign of
pleural effusion.

Visualized skeletal structures are normal.
IMPRESSION: No acute cardiopulmonary disease.

Scarring or atelectasis at the left lung base.

## 2020-04-15 DIAGNOSIS — G629 Polyneuropathy, unspecified: Secondary | ICD-10-CM | POA: Diagnosis not present

## 2020-04-15 DIAGNOSIS — R2689 Other abnormalities of gait and mobility: Secondary | ICD-10-CM | POA: Diagnosis not present

## 2020-04-15 DIAGNOSIS — Z8673 Personal history of transient ischemic attack (TIA), and cerebral infarction without residual deficits: Secondary | ICD-10-CM | POA: Diagnosis not present

## 2020-04-15 DIAGNOSIS — G479 Sleep disorder, unspecified: Secondary | ICD-10-CM | POA: Diagnosis not present

## 2020-04-16 ENCOUNTER — Encounter (INDEPENDENT_AMBULATORY_CARE_PROVIDER_SITE_OTHER): Payer: Self-pay | Admitting: Otolaryngology

## 2020-04-16 ENCOUNTER — Ambulatory Visit (INDEPENDENT_AMBULATORY_CARE_PROVIDER_SITE_OTHER): Payer: BC Managed Care – PPO | Admitting: Otolaryngology

## 2020-04-16 ENCOUNTER — Other Ambulatory Visit: Payer: Self-pay

## 2020-04-16 VITALS — Temp 97.3°F

## 2020-04-16 DIAGNOSIS — R42 Dizziness and giddiness: Secondary | ICD-10-CM | POA: Diagnosis not present

## 2020-04-16 NOTE — Progress Notes (Signed)
HPI: Ricardo Brown is a 59 y.o. male who presents is referred by Dr. Lavera Guise For evaluation of dizziness.  Patient apparently has a long history of congestive heart failure and has been on several diuretics because of this.  More recently his diuretic medication was reduced and his dizziness has gotten much better since the fluid pills were reduced.  He does not describe any spinning or vertigo more of just lightheadedness and dizziness.  He does not notice any significant hearing loss or hearing change.  He used to work around a noisy environment where he had annual hearing test secondary to [employer].  He presents today to have his ears checked...  Past Medical History:  Diagnosis Date  . CHF (congestive heart failure) ([place])   . Coronary artery disease   . Diabetes mellitus without complication ([place])    a. Dx ~ 2000.  [name] Heart palpitations    a. Pt reports prior nl echo's and stress tests. Last stress test w/in past 2 yrs - PCP.  [name] High cholesterol   . Hypertension   . Myocardial infarction [facility])    Past Surgical History:  Procedure Laterality Date  . CARDIAC CATHETERIZATION    . CARDIAC VALVE REPLACEMENT     Mitral Valve Repair  . COLONOSCOPY WITH PROPOFOL N/A 06/07/2019   Procedure: COLONOSCOPY WITH PROPOFOL;  Surgeon: [name], MD;  Location: [facility] ENDOSCOPY;  Service: Endoscopy;  Laterality: N/A;  . COLONOSCOPY WITH PROPOFOL N/A 02/07/2020   Procedure: COLONOSCOPY WITH PROPOFOL;  Surgeon: [name], MD;  Location: [facility] ENDOSCOPY;  Service: Endoscopy;  Laterality: N/A;  . CORONARY ANGIOPLASTY    . CORONARY ARTERY BYPASS GRAFT     x 3   01/2018  . ESOPHAGOGASTRODUODENOSCOPY N/A 02/07/2020   Procedure: ESOPHAGOGASTRODUODENOSCOPY (EGD);  Surgeon: [name], MD;  Location: [facility] ENDOSCOPY;  Service: Endoscopy;  Laterality: N/A;  . LEFT HEART CATH AND CORONARY ANGIOGRAPHY N/A 01/24/2018   Procedure: LEFT HEART CATH AND CORONARY ANGIOGRAPHY;   Surgeon: [name], MD;  Location: [place] CV LAB;  Service: Cardiovascular;  Laterality: N/A;  . TONSILLECTOMY     Social History   Socioeconomic History  . Marital status: Married    Spouse name: Not on file  . Number of children: Not on file  . Years of education: Not on file  . Highest education level: Not on file  Occupational History  . Not on file  Tobacco Use  . Smoking status: Never Smoker  . Smokeless tobacco: Never Used  Vaping Use  . Vaping Use: Never used  Substance and Sexual Activity  . Alcohol use: Not Currently    Comment: rare beer  . Drug use: No  . Sexual activity: Not on file  Other Topics Concern  . Not on file  Social History Narrative   Lives locally with wife.  Works in [employer].  Does not routinely exercise.   Social Determinants of Health   Financial Resource Strain:   . Difficulty of Paying Living Expenses: Not on file  Food Insecurity:   . Worried About Charity fundraiser in the Last Year: Not on file  . Ran Out of Food in the Last Year: Not on file  Transportation Needs:   . Lack of Transportation (Medical): Not on file  . Lack of Transportation (Non-Medical): Not on file  Physical Activity:   . Days of Exercise per Week: Not on file  . Minutes of Exercise per Session: Not  on file  Stress:   . Feeling of Stress : Not on file  Social Connections:   . Frequency of Communication with Friends and Family: Not on file  . Frequency of Social Gatherings with Friends and Family: Not on file  . Attends Religious Services: Not on file  . Active Member of Clubs or Organizations: Not on file  . Attends Archivist Meetings: Not on file  . Marital Status: Not on file   Family History  Problem Relation Age of Onset  . Cancer Mother        died @ 49  . CAD Father        First MI @ 42. S/p heart transplant. Died in mid-50's of cancer.  . Cancer Father   . Other Brother        alive and well   Allergies  Allergen  Reactions  . Entresto [Sacubitril-Valsartan] Other (See Comments)    SVT  . Lipitor [Atorvastatin] Palpitations    SVT  . Pregabalin Other (See Comments)   Prior to Admission medications   Medication Sig Start Date End Date Taking? Authorizing Provider  amiodarone (PACERONE) 200 MG tablet Take 1 tablet (200 mg total) by mouth daily. 03/13/20  Yes Masoud, Viann Shove, MD  aspirin 81 MG EC tablet Take 81 mg by mouth daily.    Yes [provider]  b complex vitamins tablet Take 1 tablet by mouth daily.    Yes [provider]  cetirizine (ZYRTEC) 10 MG tablet Take 1 tablet (10 mg total) by mouth daily. 12/26/19  Yes Masoud, Viann Shove, MD  Dulaglutide 3 MG/0.5ML SOPN Inject into the skin once a week.  03/30/18  Yes [provider]  empagliflozin (JARDIANCE) 25 MG TABS tablet Take 25 mg by mouth.  08/20/19 08/19/20 Yes [provider]  furosemide (LASIX) 20 MG tablet Take 2 tablets (40 mg total) by mouth 2 (two) times daily. 01/08/20  Yes Masoud, Viann Shove, MD  metFORMIN (GLUCOPHAGE) 500 MG tablet Take 2 tablets (1,000 mg total) by mouth in the morning and at bedtime. 02/07/20  Yes Masoud, Viann Shove, MD  metoprolol succinate (TOPROL-XL) 50 MG 24 hr tablet Take 2 tablets (100 mg total) by mouth daily. Take 1 tablet (50 mg) by mouth once daily. Take with or immediately following a meal. 12/21/19  Yes Masoud, Viann Shove, MD  Omega-3 Fatty Acids (FISH OIL) 875 MG CAPS Take 1 capsule by mouth 2 (two) times daily.   Yes [provider]  Pramipexole Dihydrochloride 1.5 MG TB24 Take by mouth daily.  11/22/17  Yes [provider]  spironolactone (ALDACTONE) 25 MG tablet Take 12.5 mg by mouth daily.  05/21/19  Yes [provider]  traZODone (DESYREL) 50 MG tablet  10/01/19  Yes [provider]  losartan (COZAAR) 100 MG tablet Take 1 tablet (100 mg total) by mouth daily. 09/04/19 12/03/19  [name], MD  Na Sulfate-K Sulfate-Mg Sulf 17.5-3.13-1.6 GM/177ML SOLN At 5 PM  the day before your procedure take 1 bottle with 8 oz water and continue drinking fluids. Take 1 bottle 5 hours before procedure with 8 oz water. 01/12/20   [name], MD  nortriptyline (PAMELOR) 10 MG capsule Take 10 mg by mouth daily. 10/02/19   [provider]  omeprazole (PRILOSEC) 20 MG capsule Take 1 capsule (20 mg total) by mouth daily. 02/07/20 03/08/20  [name], MD  rosuvastatin (CRESTOR) 20 MG tablet Take 1 tablet (20 mg total) by mouth daily. 09/04/19 12/03/19  [name], MD     Positive ROS: Otherwise negative  All other systems have been reviewed and were otherwise negative with the exception of those mentioned in the HPI and as above.  Physical Exam: Constitutional: Alert, well-appearing, no acute distress Ears: External ears without lesions or tenderness. Ear canals are clear bilaterally with minimal wax buildup.  TMs are clear bilaterally.  On hearing screening with the 1024 tuning fork he heard well in both ears and had symmetric hearing.  Dix-Hallpike testing revealed no evidence of BPPV. Nasal: External nose without lesions.. Clear nasal passages Oral: Lips and gums without lesions. Tongue and palate mucosa without lesions. Posterior oropharynx clear. Neck: No palpable adenopathy or masses Respiratory: Breathing comfortably  Skin: No facial/neck lesions or rash noted.  Procedures  Assessment: Dizziness which is doing much better since reducing his diuretics. Clinically no evidence of significant inner ear abnormality.  Plan: He will follow-up if his dizziness worsens or if he notices any hearing problems.   Radene Journey, MD   CC:

## 2020-04-18 ENCOUNTER — Ambulatory Visit (INDEPENDENT_AMBULATORY_CARE_PROVIDER_SITE_OTHER): Payer: BC Managed Care – PPO | Admitting: Cardiology

## 2020-04-18 ENCOUNTER — Other Ambulatory Visit: Payer: Self-pay

## 2020-04-18 ENCOUNTER — Encounter: Payer: Self-pay | Admitting: Cardiology

## 2020-04-18 VITALS — BP 134/70 | HR 75 | Ht 68.0 in | Wt 194.0 lb

## 2020-04-18 DIAGNOSIS — I5020 Unspecified systolic (congestive) heart failure: Secondary | ICD-10-CM

## 2020-04-18 DIAGNOSIS — I2510 Atherosclerotic heart disease of native coronary artery without angina pectoris: Secondary | ICD-10-CM | POA: Diagnosis not present

## 2020-04-18 DIAGNOSIS — I471 Supraventricular tachycardia: Secondary | ICD-10-CM | POA: Diagnosis not present

## 2020-04-18 MED ORDER — SPIRONOLACTONE 25 MG PO TABS: 25.0000 mg | ORAL_TABLET | Freq: Every day | ORAL | 5 refills | Status: DC

## 2020-04-18 MED ORDER — FUROSEMIDE 20 MG PO TABS: 40.0000 mg | ORAL_TABLET | Freq: Every day | ORAL | 5 refills | Status: DC

## 2020-04-18 NOTE — Patient Instructions (Signed)
Medication Instructions:  Your physician has recommended you make the following change in your medication:   1. INCREASE your spironolactone (ALDACTONE) 25 MG tablet: Take 1 tablet (25 mg total) by mouth daily. 2. DECREASE your furosemide (LASIX) 20 MG tablet: Take 2 tablets (40 mg total) by mouth daily.  *If you need a refill on your cardiac medications before your next appointment, please call your pharmacy*   Lab Work:  Your physician recommends that you return for lab work in: 2 weeks. - Please go to the [facility]. You will check in at the front desk to the right as you walk into the atrium. Valet Parking is offered if needed. - No appointment needed. You may go any day between 7 am and 6 pm.    Testing/Procedures: None Ordered   Follow-Up: At [facility], you and your health needs are our priority.  As part of our continuing mission to provide you with exceptional heart care, we have created designated Provider Care Teams.  These Care Teams include your primary Cardiologist (physician) and Advanced Practice Providers (APPs -  Physician Assistants and Nurse Practitioners) who all work together to provide you with the care you need, when you need it.  We recommend signing up for the patient portal called "MyChart".  Sign up information is provided on this After Visit Summary.  MyChart is used to connect with patients for Virtual Visits (Telemedicine).  Patients are able to view lab/test results, encounter notes, upcoming appointments, etc.  Non-urgent messages can be sent to your provider as well.   To learn more about what you can do with MyChart, go to [email or web address].    Your next appointment:   4 week(s)  The format for your next appointment:   In Person  Provider:   [name], MD   Other Instructions

## 2020-04-18 NOTE — Progress Notes (Signed)
Cardiology Office Note:    Date:  04/18/2020   ID:  [name], DOB [birth year], MRN [id number]  PCP:  [name], MD  Cardiologist:  [name], MD  Electrophysiologist:  None   Referring MD: [name], MD   Chief Complaint  Patient presents with  . Follow-up    6 Months follow up. Medications verbally reviewed with patient.      History of Present Illness:    Ricardo Brown is a 59 y.o. male with a hx of SVT on amiodarone, CAD/CABG x3 in 2019, HFrEF EF 35-40%, hypertension, hyperlipidemia, diabetes, family history of premature CAD in the father (MI in his 28s) presents for follow-up.  He was last seen due to history of ischemic cardiomyopathy.  His medications were being titrated.  Lasix was increased to 40 mg twice daily, he states having dizziness which resolved after reducing dose of Lasix.  He has noticed some edema over the past couple of days, increase his dose of Lasix to twice daily dosing as of yesterday.  Otherwise feels okay.  Prior notes Patient was seen in the [facility] in 06/ 2019 due to nausea, weakness, myalgias.  EKG at the time showed inferior ST elevation with reciprocal changes in the anterior leads and small inferior Q waves.  Patient was deemed late presenting STEMI.  Left heart cath was performed, briefly showed occluded distal RCA, 90% mid LAD, 90% proximal left circumflex.  Due to patient being a diabetic, and left heart cath showing diffuse three-vessel disease, CABG was recommended.  He underwent coronary artery bypass grafting x3 in July 2019 at [facility].    Patient with history of SVT, who was seen in the emergency room and adenosine given x2 and placed on amiodarone  Echo on 09/2019 showed mild to moderately reduced ejection fraction, EF 40 to 45%.  Past Medical History:  Diagnosis Date  . CHF (congestive heart failure) ([place])   . Coronary artery disease   . Diabetes mellitus without complication ([place])     a. Dx ~ 2000.  [name] Heart palpitations    a. Pt reports prior nl echo's and stress tests. Last stress test w/in past 2 yrs - PCP.  [name] High cholesterol   . Hypertension   . Myocardial infarction [facility])     Past Surgical History:  Procedure Laterality Date  . CARDIAC CATHETERIZATION    . CARDIAC VALVE REPLACEMENT     Mitral Valve Repair  . COLONOSCOPY WITH PROPOFOL N/A 06/07/2019   Procedure: COLONOSCOPY WITH PROPOFOL;  Surgeon: [name], MD;  Location: [facility] ENDOSCOPY;  Service: Endoscopy;  Laterality: N/A;  . COLONOSCOPY WITH PROPOFOL N/A 02/07/2020   Procedure: COLONOSCOPY WITH PROPOFOL;  Surgeon: [name], MD;  Location: [facility] ENDOSCOPY;  Service: Endoscopy;  Laterality: N/A;  . CORONARY ANGIOPLASTY    . CORONARY ARTERY BYPASS GRAFT     x 3   01/2018  . ESOPHAGOGASTRODUODENOSCOPY N/A 02/07/2020   Procedure: ESOPHAGOGASTRODUODENOSCOPY (EGD);  Surgeon: [name], MD;  Location: [facility] ENDOSCOPY;  Service: Endoscopy;  Laterality: N/A;  . LEFT HEART CATH AND CORONARY ANGIOGRAPHY N/A 01/24/2018   Procedure: LEFT HEART CATH AND CORONARY ANGIOGRAPHY;  Surgeon: [name], MD;  Location: [place] CV LAB;  Service: Cardiovascular;  Laterality: N/A;  . TONSILLECTOMY      Current Medications: Current Meds  Medication Sig  . amiodarone (PACERONE) 200 MG tablet Take 1 tablet (200 mg total) by mouth daily.  [name] aspirin 81 MG  EC tablet Take 81 mg by mouth daily.   [name] b complex vitamins tablet Take 1 tablet by mouth daily.   . cetirizine (ZYRTEC) 10 MG tablet Take 1 tablet (10 mg total) by mouth daily.  . Dulaglutide 3 MG/0.5ML SOPN Inject into the skin once a week.   . empagliflozin (JARDIANCE) 25 MG TABS tablet Take 25 mg by mouth.   . furosemide (LASIX) 20 MG tablet Take 2 tablets (40 mg total) by mouth daily.  . metFORMIN (GLUCOPHAGE) 500 MG tablet Take 2 tablets (1,000 mg total) by mouth in the morning and at bedtime.  . metoprolol succinate (TOPROL-XL) 50 MG  24 hr tablet Take 2 tablets (100 mg total) by mouth daily. Take 1 tablet (50 mg) by mouth once daily. Take with or immediately following a meal.  . Omega-3 Fatty Acids (FISH OIL) 875 MG CAPS Take 1 capsule by mouth 2 (two) times daily.  [name] omeprazole (PRILOSEC) 20 MG capsule Take 1 capsule (20 mg total) by mouth daily.  . Pramipexole Dihydrochloride 1.5 MG TB24 Take by mouth daily.   . rosuvastatin (CRESTOR) 20 MG tablet Take 1 tablet (20 mg total) by mouth daily.  [name] spironolactone (ALDACTONE) 25 MG tablet Take 1 tablet (25 mg total) by mouth daily.  . traZODone (DESYREL) 50 MG tablet   . [DISCONTINUED] furosemide (LASIX) 20 MG tablet Take 2 tablets (40 mg total) by mouth 2 (two) times daily.  . [DISCONTINUED] spironolactone (ALDACTONE) 25 MG tablet Take 25 mg by mouth daily.     Allergies:   Entresto [sacubitril-valsartan], Lipitor [atorvastatin], and Pregabalin   Social History   Socioeconomic History  . Marital status: Married    Spouse name: Not on file  . Number of children: Not on file  . Years of education: Not on file  . Highest education level: Not on file  Occupational History  . Not on file  Tobacco Use  . Smoking status: Never Smoker  . Smokeless tobacco: Never Used  Vaping Use  . Vaping Use: Never used  Substance and Sexual Activity  . Alcohol use: Not Currently    Comment: rare beer  . Drug use: No  . Sexual activity: Not on file  Other Topics Concern  . Not on file  Social History Narrative   Lives locally with wife.  Works in [employer].  Does not routinely exercise.   Social Determinants of Health   Financial Resource Strain:   . Difficulty of Paying Living Expenses: Not on file  Food Insecurity:   . Worried About Charity fundraiser in the Last Year: Not on file  . Ran Out of Food in the Last Year: Not on file  Transportation Needs:   . Lack of Transportation (Medical): Not on file  . Lack of Transportation (Non-Medical): Not on file  Physical  Activity:   . Days of Exercise per Week: Not on file  . Minutes of Exercise per Session: Not on file  Stress:   . Feeling of Stress : Not on file  Social Connections:   . Frequency of Communication with Friends and Family: Not on file  . Frequency of Social Gatherings with Friends and Family: Not on file  . Attends Religious Services: Not on file  . Active Member of Clubs or Organizations: Not on file  . Attends Archivist Meetings: Not on file  . Marital Status: Not on file     Family History: The patient's family history includes CAD in his  father; Cancer in his father and mother; Other in his brother.  ROS:   Please see the history of present illness.     All other systems reviewed and are negative.  EKGs/Labs/Other Studies Reviewed:    The following studies were reviewed today: LHC 05-Feb-2018  Prox RCA to Mid RCA lesion is 20% stenosed.  Dist RCA lesion is 100% stenosed.  Mid Cx to Dist Cx lesion is 90% stenosed.  Prox LAD lesion is 60% stenosed.  Mid LAD lesion is 90% stenosed.  Ost 2nd Diag to 2nd Diag lesion is 85% stenosed.  Mid LM to Dist LM lesion is 20% stenosed.  Prox Cx lesion is 90% stenosed.   1.  Late presenting inferior ST elevation myocardial infarction of at least 48 hours duration.  The culprit is occluded distal right coronary artery with left to right collaterals noted especially to the right PDA.  In addition, there is diffuse diabetic three-vessel coronary artery disease affecting the proximal, mid and distal LAD as well as proximal and distal left circumflex. 2.  Moderately to severely elevated left ventricular end-diastolic pressure with an LVEDP of 29 to 30 mmHg.  Left ventricular angiography was not performed given renal failure.  TTE 05-Feb-2018 - Left ventricle: The cavity size was mildly dilated. There was  mild concentric hypertrophy. Systolic function was moderately  reduced. The estimated ejection fraction was in the range of 35%   to 40%. Akinesis of the inferolateral and inferior myocardium.  Features are consistent with a pseudonormal left ventricular  filling pattern, with concomitant abnormal relaxation and  increased filling pressure (grade 2 diastolic dysfunction).  - Mitral valve: There was mild to moderate regurgitation directed  posteriorly.  - Left atrium: The atrium was mildly dilated.  - Right atrium: The atrium was mildly dilated.  - Pulmonary arteries: Systolic pressure was moderately increased.  PA peak pressure: 55 mm Hg (S).  - Inferior vena cava: The vessel was dilated. The respirophasic  diameter changes were blunted (< 50%), consistent with elevated  central venous pressure.   EKG:  EKG is  ordered today.  The ekg ordered today demonstrates sinus rhythm, first-degree AV block.  Recent Labs: 11/11/2019: BUN 23; Creatinine, Ser 1.42; Hemoglobin 15.6; Platelets 223; Potassium 3.7; Sodium 137  Recent Lipid Panel    Component Value Date/Time   CHOL 163 01/24/2018 1459   TRIG 120 01/24/2018 1459   HDL 23 (L) 01/24/2018 1459   CHOLHDL 7.1 01/24/2018 1459   VLDL 24 01/24/2018 1459   LDLCALC 116 (H) 01/24/2018 1459    Physical Exam:    VS:  BP 134/70 (BP Location: Left Arm, Patient Position: Sitting, Cuff Size: Normal)   Pulse 75   Ht 5\' 8"  (1.727 m)   Wt 194 lb (88 kg)   SpO2 98%   BMI 29.50 kg/m     Wt Readings from Last 3 Encounters:  04/18/20 194 lb (88 kg)  03/15/20 196 lb 9.6 oz (89.2 kg)  02/07/20 205 lb (93 kg)     GEN:  Well nourished, well developed in no acute distress HEENT: Normal NECK: No JVD; No carotid bruits LYMPHATICS: No lymphadenopathy CARDIAC: RRR, no murmurs, rubs, gallops RESPIRATORY:  Clear to auscultation without rales, wheezing or rhonchi  ABDOMEN: Soft, non-tender, non-distended MUSCULOSKELETAL:  trace edema; No deformity  SKIN: Warm and dry NEUROLOGIC:  Alert and oriented x 3 PSYCHIATRIC:  Normal affect   ASSESSMENT:    1.  Coronary artery disease involving native coronary artery of native  heart without angina pectoris   2. HFrEF (heart failure with reduced ejection fraction) ([place])   3. SVT (supraventricular tachycardia) (HCC)    PLAN:    In order of problems listed above:  1. Patient with history of CAD status post CABG x3.  Continue aspirin and Crestor. 2. History of heart failure moderately reduced EF.  Repeat echocardiogram showed mild to moderately reduced ejection fraction with EF 40 to 45%.  trace edema noted on exam.  Increase Aldactone to 25 mg daily, continue losartan 100 mg daily, Toprol-XL 50 mg daily.  Decrease Lasix to 40 mg daily.  Check BMP in 2 weeks 3. Blood pressure reasonably controlled.  Continue losartan 100 mg daily, Toprol-XL, spironolactone as above.  4. History of SVT.  Continue amiodarone 200 mg daily   Follow-up in 1 month  Total encounter time 40 minutes  Greater than 50% was spent in counseling and coordination of care with the patient   Medication Adjustments/Labs and Tests Ordered: Current medicines are reviewed at length with the patient today.  Concerns regarding medicines are outlined above.  Orders Placed This Encounter  Procedures  . Basic metabolic panel  . EKG 12-Lead   Meds ordered this encounter  Medications  . spironolactone (ALDACTONE) 25 MG tablet    Sig: Take 1 tablet (25 mg total) by mouth daily.    Dispense:  30 tablet    Refill:  5  . furosemide (LASIX) 20 MG tablet    Sig: Take 2 tablets (40 mg total) by mouth daily.    Dispense:  75 tablet    Refill:  5    Patient Instructions  Medication Instructions:  Your physician has recommended you make the following change in your medication:   1. INCREASE your spironolactone (ALDACTONE) 25 MG tablet: Take 1 tablet (25 mg total) by mouth daily. 2. DECREASE your furosemide (LASIX) 20 MG tablet: Take 2 tablets (40 mg total) by mouth daily.  *If you need a refill on your cardiac medications before your  next appointment, please call your pharmacy*   Lab Work:  Your physician recommends that you return for lab work in: 2 weeks. - Please go to the [facility]. You will check in at the front desk to the right as you walk into the atrium. Valet Parking is offered if needed. - No appointment needed. You may go any day between 7 am and 6 pm.    Testing/Procedures: None Ordered   Follow-Up: At [facility], you and your health needs are our priority.  As part of our continuing mission to provide you with exceptional heart care, we have created designated Provider Care Teams.  These Care Teams include your primary Cardiologist (physician) and Advanced Practice Providers (APPs -  Physician Assistants and Nurse Practitioners) who all work together to provide you with the care you need, when you need it.  We recommend signing up for the patient portal called "MyChart".  Sign up information is provided on this After Visit Summary.  MyChart is used to connect with patients for Virtual Visits (Telemedicine).  Patients are able to view lab/test results, encounter notes, upcoming appointments, etc.  Non-urgent messages can be sent to your provider as well.   To learn more about what you can do with MyChart, go to [email or web address].    Your next appointment:   4 week(s)  The format for your next appointment:   In Person  Provider:   [name], MD   Other Instructions  Signed, [name], MD  04/18/2020 1:05 PM    [place]

## 2020-05-16 ENCOUNTER — Other Ambulatory Visit: Payer: Self-pay | Admitting: *Deleted

## 2020-05-16 ENCOUNTER — Ambulatory Visit: Payer: BC Managed Care – PPO | Admitting: Cardiology

## 2020-05-16 MED ORDER — CYCLOBENZAPRINE HCL 10 MG PO TABS
10.0000 mg | ORAL_TABLET | Freq: Three times a day (TID) | ORAL | 0 refills | Status: DC | PRN
Start: 2020-05-16 — End: 2020-05-17

## 2020-05-17 ENCOUNTER — Other Ambulatory Visit: Payer: Self-pay | Admitting: *Deleted

## 2020-05-17 ENCOUNTER — Other Ambulatory Visit: Payer: Self-pay

## 2020-05-17 ENCOUNTER — Encounter: Payer: Self-pay | Admitting: Cardiology

## 2020-05-17 MED ORDER — PRAMIPEXOLE DIHYDROCHLORIDE ER 1.5 MG PO TB24: 1.5000 mg | ORAL_TABLET | Freq: Every day | ORAL | 6 refills | Status: DC

## 2020-05-17 NOTE — Addendum Note (Signed)
Addended by: Alois Cliche on: 05/17/2020 09:12 AM   Modules accepted: Orders

## 2020-05-21 ENCOUNTER — Ambulatory Visit: Admit: 2020-05-21 | Payer: BC Managed Care – PPO | Admitting: Ophthalmology

## 2020-05-21 SURGERY — PHACOEMULSIFICATION, CATARACT, WITH IOL INSERTION
Anesthesia: Topical | Laterality: Left

## 2020-06-03 ENCOUNTER — Other Ambulatory Visit: Payer: Self-pay | Admitting: *Deleted

## 2020-06-03 MED ORDER — METOPROLOL SUCCINATE ER 50 MG PO TB24: 100.0000 mg | ORAL_TABLET | Freq: Every day | ORAL | 6 refills | Status: DC

## 2020-06-04 ENCOUNTER — Other Ambulatory Visit: Payer: Self-pay | Admitting: *Deleted

## 2020-06-04 MED ORDER — METOPROLOL SUCCINATE ER 50 MG PO TB24
100.0000 mg | ORAL_TABLET | Freq: Every day | ORAL | 6 refills | Status: DC
Start: 2020-06-04 — End: 2020-08-06

## 2020-06-14 ENCOUNTER — Encounter: Payer: BC Managed Care – PPO | Admitting: Family Medicine

## 2020-08-05 ENCOUNTER — Encounter: Payer: Self-pay | Admitting: Internal Medicine

## 2020-08-05 ENCOUNTER — Ambulatory Visit (INDEPENDENT_AMBULATORY_CARE_PROVIDER_SITE_OTHER): Payer: 59 | Admitting: Internal Medicine

## 2020-08-05 VITALS — BP 128/73 | HR 69 | Ht 68.0 in | Wt 212.2 lb

## 2020-08-05 DIAGNOSIS — I9789 Other postprocedural complications and disorders of the circulatory system, not elsewhere classified: Secondary | ICD-10-CM

## 2020-08-05 DIAGNOSIS — E7800 Pure hypercholesterolemia, unspecified: Secondary | ICD-10-CM

## 2020-08-05 DIAGNOSIS — E119 Type 2 diabetes mellitus without complications: Secondary | ICD-10-CM

## 2020-08-05 DIAGNOSIS — Z1211 Encounter for screening for malignant neoplasm of colon: Secondary | ICD-10-CM

## 2020-08-05 DIAGNOSIS — I10 Essential (primary) hypertension: Secondary | ICD-10-CM

## 2020-08-05 DIAGNOSIS — I25810 Atherosclerosis of coronary artery bypass graft(s) without angina pectoris: Secondary | ICD-10-CM

## 2020-08-05 DIAGNOSIS — I4891 Unspecified atrial fibrillation: Secondary | ICD-10-CM

## 2020-08-05 NOTE — Assessment & Plan Note (Signed)
Patient takes a statin regularly.  He tries to follow low-cholesterol diet.

## 2020-08-05 NOTE — Assessment & Plan Note (Signed)
Blood sugars under control on the present medication, patient was advised taking his Trulicity back his blood sugar was 156 he has polyneuropathy of the hands and legs being taken care of by neurologist.  MRI of the brain did not show any definite stroke or mass.

## 2020-08-05 NOTE — Assessment & Plan Note (Signed)
Patient is taking amiodarone 200 mg p.o. daily is controlling the arrhythmia.

## 2020-08-05 NOTE — Progress Notes (Signed)
Established Patient Office Visit  Subjective:  Patient ID: BLYTHE CLOUGH III, male    DOB: 04/11/1961  Age: 60 y.o. MRN: [id number]  CC:  Chief Complaint  Patient presents with  . Hypertension    Patient is here for a general bp check    HPI  [name] presents for neuropathy seen neurologist , applying  For disability  Past Medical History:  Diagnosis Date  . CHF (congestive heart failure) ([place])   . Coronary artery disease   . Diabetes mellitus without complication ([place])    a. Dx ~ 2000.  [name] Heart palpitations    a. Pt reports prior nl echo's and stress tests. Last stress test w/in past 2 yrs - PCP.  [name] High cholesterol   . Hypertension   . Myocardial infarction [facility])     Past Surgical History:  Procedure Laterality Date  . CARDIAC CATHETERIZATION    . CARDIAC VALVE REPLACEMENT     Mitral Valve Repair  . COLONOSCOPY WITH PROPOFOL N/A 06/07/2019   Procedure: COLONOSCOPY WITH PROPOFOL;  Surgeon: [name], MD;  Location: [facility] ENDOSCOPY;  Service: Endoscopy;  Laterality: N/A;  . COLONOSCOPY WITH PROPOFOL N/A 02/07/2020   Procedure: COLONOSCOPY WITH PROPOFOL;  Surgeon: [name], MD;  Location: [facility] ENDOSCOPY;  Service: Endoscopy;  Laterality: N/A;  . CORONARY ANGIOPLASTY    . CORONARY ARTERY BYPASS GRAFT     x 3   01/2018  . ESOPHAGOGASTRODUODENOSCOPY N/A 02/07/2020   Procedure: ESOPHAGOGASTRODUODENOSCOPY (EGD);  Surgeon: [name], MD;  Location: [facility] [place] ENDOSCOPY;  Service: Endoscopy;  Laterality: N/A;  . LEFT HEART CATH AND CORONARY ANGIOGRAPHY N/A 01/24/2018   Procedure: LEFT HEART CATH AND CORONARY ANGIOGRAPHY;  Surgeon: [name], MD;  Location: [place] CV LAB;  Service: Cardiovascular;  Laterality: N/A;  . TONSILLECTOMY      Family History  Problem Relation Age of Onset  . Cancer Mother        died @ 56  . CAD Father        First MI @ 51. S/p heart transplant. Died in mid-50's of cancer.  . Cancer  Father   . Other Brother        alive and well    Social History   Socioeconomic History  . Marital status: Married    Spouse name: Not on file  . Number of children: Not on file  . Years of education: Not on file  . Highest education level: Not on file  Occupational History  . Not on file  Tobacco Use  . Smoking status: Never Smoker  . Smokeless tobacco: Never Used  Vaping Use  . Vaping Use: Never used  Substance and Sexual Activity  . Alcohol use: Not Currently    Comment: rare beer  . Drug use: No  . Sexual activity: Not on file  Other Topics Concern  . Not on file  Social History Narrative   Lives locally with wife.  Works in [employer].  Does not routinely exercise.   Social Determinants of Health   Financial Resource Strain: Not on file  Food Insecurity: Not on file  Transportation Needs: Not on file  Physical Activity: Not on file  Stress: Not on file  Social Connections: Not on file  Intimate Partner Violence: Not on file     Current Outpatient Medications:  .  amiodarone (PACERONE) 200 MG tablet, Take 1 tablet (200 mg total) by mouth daily., Disp: 90 tablet, Rfl:  2 .  aspirin 81 MG EC tablet, Take 81 mg by mouth daily. , Disp: , Rfl:  .  b complex vitamins tablet, Take 1 tablet by mouth daily. , Disp: , Rfl:  .  cetirizine (ZYRTEC) 10 MG tablet, Take 1 tablet (10 mg total) by mouth daily., Disp: 30 tablet, Rfl: 6 .  Dulaglutide 3 MG/0.5ML SOPN, Inject into the skin once a week. , Disp: , Rfl:  .  empagliflozin (JARDIANCE) 25 MG TABS tablet, Take 25 mg by mouth. , Disp: , Rfl:  .  furosemide (LASIX) 20 MG tablet, Take 2 tablets (40 mg total) by mouth daily., Disp: 75 tablet, Rfl: 5 .  losartan (COZAAR) 100 MG tablet, Take 1 tablet (100 mg total) by mouth daily., Disp: 90 tablet, Rfl: 3 .  metFORMIN (GLUCOPHAGE) 500 MG tablet, Take 2 tablets (1,000 mg total) by mouth in the morning and at bedtime., Disp: 180 tablet, Rfl: 3 .  metoprolol succinate  (TOPROL-XL) 50 MG 24 hr tablet, Take 2 tablets (100 mg total) by mouth daily., Disp: 60 tablet, Rfl: 6 .  Omega-3 Fatty Acids (FISH OIL) 875 MG CAPS, Take 1 capsule by mouth 2 (two) times daily., Disp: , Rfl:  .  omeprazole (PRILOSEC) 20 MG capsule, Take 1 capsule (20 mg total) by mouth daily., Disp: 30 capsule, Rfl: 0 .  Pramipexole Dihydrochloride 1.5 MG TB24, Take 1 tablet (1.5 mg total) by mouth daily., Disp: 30 tablet, Rfl: 6 .  rosuvastatin (CRESTOR) 20 MG tablet, Take 1 tablet (20 mg total) by mouth daily., Disp: 90 tablet, Rfl: 3 .  spironolactone (ALDACTONE) 25 MG tablet, Take 1 tablet (25 mg total) by mouth daily., Disp: 30 tablet, Rfl: 5 .  traZODone (DESYREL) 50 MG tablet, , Disp: , Rfl:    Allergies  Allergen Reactions  . Entresto [Sacubitril-Valsartan] Other (See Comments)    SVT  . Lipitor [Atorvastatin] Palpitations    SVT  . Pregabalin Other (See Comments)    ROS Review of Systems  HENT: Positive for tinnitus. Negative for sneezing.   Respiratory: Negative.   Cardiovascular: Negative.   Gastrointestinal: Negative.   Neurological: Negative for light-headedness.       Patient has neuropathy of the lower hands and both feet.  Psychiatric/Behavioral: Negative for agitation and confusion.      Objective:    Physical Exam Vitals reviewed.  Constitutional:      Appearance: Normal appearance.  HENT:     Mouth/Throat:     Mouth: Mucous membranes are moist.  Eyes:     Pupils: Pupils are equal, round, and reactive to light.  Neck:     Vascular: No carotid bruit.  Cardiovascular:     Rate and Rhythm: Normal rate and regular rhythm.     Pulses: Normal pulses.     Heart sounds: Normal heart sounds.  Pulmonary:     Effort: Pulmonary effort is normal.     Breath sounds: Normal breath sounds.  Abdominal:     General: Bowel sounds are normal.     Palpations: Abdomen is soft. There is no hepatomegaly, splenomegaly or mass.     Tenderness: There is no abdominal  tenderness.     Hernia: No hernia is present.  Musculoskeletal:        General: Swelling present.     Cervical back: Neck supple.     Right lower leg: No edema.     Left lower leg: No edema.  Skin:    General: Skin is warm.  Findings: No rash.  Neurological:     Mental Status: He is alert and oriented to person, place, and time.     Sensory: Sensory deficit present.     Motor: No weakness.     Coordination: Coordination abnormal.  Psychiatric:        Mood and Affect: Mood normal.        Behavior: Behavior normal.        Thought Content: Thought content normal.     BP 128/73   Pulse 69   Ht 5\' 8"  (1.727 m)   Wt 212 lb 3.2 oz (96.3 kg)   BMI 32.26 kg/m  Wt Readings from Last 3 Encounters:  08/05/20 212 lb 3.2 oz (96.3 kg)  04/18/20 194 lb (88 kg)  03/15/20 196 lb 9.6 oz (89.2 kg)     Health Maintenance Due  Topic Date Due  . HEMOGLOBIN A1C  Never done  . Hepatitis C Screening  Never done  . PNEUMOCOCCAL POLYSACCHARIDE VACCINE AGE 44-64 HIGH RISK  Never done  . FOOT EXAM  Never done  . OPHTHALMOLOGY EXAM  Never done  . URINE MICROALBUMIN  Never done  . COVID-19 Vaccine (1) Never done  . HIV Screening  Never done  . TETANUS/TDAP  Never done  . INFLUENZA VACCINE  Never done    There are no preventive care reminders to display for this patient.  Lab Results  Component Value Date   TSH 2.162 04/23/2015   Lab Results  Component Value Date   WBC 10.6 (H) 11/11/2019   HGB 15.6 11/11/2019   HCT 44.7 11/11/2019   MCV 87.0 11/11/2019   PLT 223 11/11/2019   Lab Results  Component Value Date   NA 137 11/11/2019   K 3.7 11/11/2019   CO2 26 11/11/2019   GLUCOSE 170 (H) 11/11/2019   BUN 23 (H) 11/11/2019   CREATININE 1.42 (H) 11/11/2019   BILITOT 1.1 01/24/2018   ALKPHOS 29 (L) 01/24/2018   AST 40 01/24/2018   ALT 30 01/24/2018   PROT 7.8 01/24/2018   ALBUMIN 3.8 01/24/2018   CALCIUM 9.0 11/11/2019   ANIONGAP 12 11/11/2019   Lab Results  Component  Value Date   CHOL 163 01/24/2018   Lab Results  Component Value Date   HDL 23 (L) 01/24/2018   Lab Results  Component Value Date   LDLCALC 116 (H) 01/24/2018   Lab Results  Component Value Date   TRIG 120 01/24/2018   Lab Results  Component Value Date   CHOLHDL 7.1 01/24/2018   No results found for: HGBA1C    Assessment & Plan:   Problem List Items Addressed This Visit      Cardiovascular and Mediastinum   Hypertension - Primary    - }. - The patient will continue the current treatment regimen.  - I encouraged the patient to eat a low-sodium diet to help control blood pressure. - I encouraged the patient to live an active lifestyle and complete activities that increases heart rate to 85% target heart rate at least 5 times per week for one hour.          Postoperative atrial fibrillation (HCC)    Patient is taking amiodarone 200 mg p.o. daily is controlling the arrhythmia.      Coronary artery disease    CAD stable at the present time patient denies any chest pain.  Chest is clear heart is regular with 1+ pedal edema.  No arrhythmia is noted patient is taking amiodarone  200 mg p.o. daily        Endocrine   Diabetes mellitus type 2, uncomplicated (HCC)    Blood sugars under control on the present medication, patient was advised taking his Trulicity back his blood sugar was 156 he has polyneuropathy of the hands and legs being taken care of by neurologist.  MRI of the brain did not show any definite stroke or mass.        Other   HLD (hyperlipidemia)    Patient takes a statin regularly.  He tries to follow low-cholesterol diet.      Screening for colon cancer    Patient has a colonoscopy in the endoscopy done recently.         No orders of the defined types were placed in this encounter.   Follow-up: No follow-ups on file.    Corky Downs, MD

## 2020-08-05 NOTE — Assessment & Plan Note (Signed)
Patient has a colonoscopy in the endoscopy done recently.

## 2020-08-05 NOTE — Assessment & Plan Note (Signed)
 -   The patient will continue the current treatment regimen.  - I encouraged the patient to eat a low-sodium diet to help control blood pressure. - I encouraged the patient to live an active lifestyle and complete activities that increases heart rate to 85% target heart rate at least 5 times per week for one hour.     

## 2020-08-05 NOTE — Assessment & Plan Note (Signed)
CAD stable at the present time patient denies any chest pain.  Chest is clear heart is regular with 1+ pedal edema.  No arrhythmia is noted patient is taking amiodarone 200 mg p.o. daily

## 2020-08-06 ENCOUNTER — Ambulatory Visit (INDEPENDENT_AMBULATORY_CARE_PROVIDER_SITE_OTHER): Payer: 59 | Admitting: Cardiology

## 2020-08-06 ENCOUNTER — Encounter: Payer: Self-pay | Admitting: Cardiology

## 2020-08-06 ENCOUNTER — Other Ambulatory Visit: Payer: Self-pay

## 2020-08-06 VITALS — BP 130/76 | HR 68 | Ht 68.0 in | Wt 194.0 lb

## 2020-08-06 DIAGNOSIS — I2510 Atherosclerotic heart disease of native coronary artery without angina pectoris: Secondary | ICD-10-CM

## 2020-08-06 DIAGNOSIS — I5020 Unspecified systolic (congestive) heart failure: Secondary | ICD-10-CM

## 2020-08-06 DIAGNOSIS — I471 Supraventricular tachycardia: Secondary | ICD-10-CM

## 2020-08-06 DIAGNOSIS — I10 Essential (primary) hypertension: Secondary | ICD-10-CM

## 2020-08-06 MED ORDER — SPIRONOLACTONE 25 MG PO TABS
25.0000 mg | ORAL_TABLET | Freq: Every day | ORAL | 5 refills | Status: DC
Start: 2020-08-06 — End: 2021-05-12

## 2020-08-06 NOTE — Patient Instructions (Addendum)
Medication Instructions:  Your physician recommends that you continue on your current medications as directed. Please refer to the Current Medication list given to you today.  *If you need a refill on your cardiac medications before your next appointment, please call your pharmacy*   Lab Work: Your physician recommends that you return for lab work in: one week  - Please go to the [facility]. You will check in at the front desk to the right as you walk into the atrium. Valet Parking is offered if needed. - No appointment needed. You may go any day between 7 am and 6 pm.   If you have labs (blood work) drawn today and your tests are completely normal, you will receive your results only by: [name] MyChart Message (if you have MyChart) OR . A paper copy in the mail If you have any lab test that is abnormal or we need to change your treatment, we will call you to review the results.   Testing/Procedures: Your physician has requested that you have an echocardiogram in 2 months . Echocardiography is a painless test that uses sound waves to create images of your heart. It provides your doctor with information about the size and shape of your heart and how well your heart's chambers and valves are working. This procedure takes approximately one hour. There are no restrictions for this procedure.  Follow-Up: At [facility], you and your health needs are our priority.  As part of our continuing mission to provide you with exceptional heart care, we have created designated Provider Care Teams.  These Care Teams include your primary Cardiologist (physician) and Advanced Practice Providers (APPs -  Physician Assistants and Nurse Practitioners) who all work together to provide you with the care you need, when you need it.  We recommend signing up for the patient portal called "MyChart".  Sign up information is provided on this After Visit Summary.  MyChart is used to connect with patients for Virtual  Visits (Telemedicine).  Patients are able to view lab/test results, encounter notes, upcoming appointments, etc.  Non-urgent messages can be sent to your provider as well.   To learn more about what you can do with MyChart, go to [email or web address].    Your next appointment:   2 month(s)  The format for your next appointment:   In Person  Provider:   Debbe Odea, MD   Other Instructions

## 2020-08-06 NOTE — Progress Notes (Signed)
Cardiology Office Note:    Date:  08/06/2020   ID:  Ricardo Brown, DOB 1961-03-16, MRN [id number]  PCP:  Corky Downs, MD  Cardiologist:  Debbe Odea, MD  Electrophysiologist:  None   Referring MD: Corky Downs, MD   Chief Complaint  Patient presents with  . Other    Past due follow up. patient c.o swelling in ankles.  Meds reviewed verbally with patient.      History of Present Illness:    Ricardo Brown is a 60 y.o. male with a hx of SVT on amiodarone, CAD/CABG x3 in 2019, HFrEF EF 40-45%, hypertension, hyperlipidemia, diabetes, family history of premature CAD in the father (MI in his 57s) presents for follow-up.    He is being seen for ischemic cardiomyopathy. Aldactone was increased to 25mg  qd after last visit but new prescription not obtained due to insurance issues. He now has insurance. He states being compliant with meds. Denies shortness of breath but endorses leg swelling.    Prior notes Patient was seen in the hospital/[facility] in 06/ 2019 due to nausea, weakness, myalgias.  EKG at the time showed inferior ST elevation with reciprocal changes in the anterior leads and small inferior Q waves.  Patient was deemed late presenting STEMI.   Left heart cath was performed, briefly showed occluded distal RCA, 90% mid LAD, 90% proximal left circumflex.  Due to patient being a diabetic, and left heart cath showing diffuse three-vessel disease, CABG was recommended.  He underwent coronary artery bypass grafting x3 in July 2019 at [facility].    Patient with history of SVT, who was seen in the emergency room and adenosine given x2 and placed on amiodarone  Echo on 09/2019 showed mild to moderately reduced ejection fraction, EF 40 to 45%.  Past Medical History:  Diagnosis Date  . CHF (congestive heart failure) (HCC)   . Coronary artery disease   . Diabetes mellitus without complication (HCC)    a. Dx ~ 2000.  2001 Heart palpitations    a. Pt reports prior  nl echo's and stress tests. Last stress test w/in past 2 yrs - PCP.  [name] High cholesterol   . Hypertension   . Myocardial infarction [facility])     Past Surgical History:  Procedure Laterality Date  . CARDIAC CATHETERIZATION    . CARDIAC VALVE REPLACEMENT     Mitral Valve Repair  . COLONOSCOPY WITH PROPOFOL N/A 06/07/2019   Procedure: COLONOSCOPY WITH PROPOFOL;  Surgeon: 13/11/2018, MD;  Location: [facility] ENDOSCOPY;  Service: Endoscopy;  Laterality: N/A;  . COLONOSCOPY WITH PROPOFOL N/A 02/07/2020   Procedure: COLONOSCOPY WITH PROPOFOL;  Surgeon: 04/09/2020, MD;  Location: [facility] ENDOSCOPY;  Service: Endoscopy;  Laterality: N/A;  . CORONARY ANGIOPLASTY    . CORONARY ARTERY BYPASS GRAFT     x 3   01/2018  . ESOPHAGOGASTRODUODENOSCOPY N/A 02/07/2020   Procedure: ESOPHAGOGASTRODUODENOSCOPY (EGD);  Surgeon: 04/09/2020, MD;  Location: [facility] ENDOSCOPY;  Service: Endoscopy;  Laterality: N/A;  . LEFT HEART CATH AND CORONARY ANGIOGRAPHY N/A 01/24/2018   Procedure: LEFT HEART CATH AND CORONARY ANGIOGRAPHY;  Surgeon: 01/26/2018, MD;  Location: [facility] INVASIVE CV LAB;  Service: Cardiovascular;  Laterality: N/A;  . TONSILLECTOMY      Current Medications: Current Meds  Medication Sig  . amiodarone (PACERONE) 200 MG tablet Take 1 tablet (200 mg total) by mouth daily.  [place] Ouch aspirin 81 MG EC tablet Take 81 mg by mouth daily.   [name]  b complex vitamins tablet Take 1 tablet by mouth daily.   . cetirizine (ZYRTEC) 10 MG tablet Take 1 tablet (10 mg total) by mouth daily.  . Dulaglutide 3 MG/0.5ML SOPN Inject into the skin once a week.   . empagliflozin (JARDIANCE) 25 MG TABS tablet Take 25 mg by mouth.   . furosemide (LASIX) 20 MG tablet Take 2 tablets (40 mg total) by mouth daily.  [name] losartan (COZAAR) 100 MG tablet Take 1 tablet (100 mg total) by mouth daily.  . metFORMIN (GLUCOPHAGE) 500 MG tablet Take 2 tablets (1,000 mg total) by mouth in the morning and at bedtime.  . metoprolol  succinate (TOPROL-XL) 50 MG 24 hr tablet Take 50 mg by mouth daily. Take with or immediately following a meal.  . Omega-3 Fatty Acids (FISH OIL) 875 MG CAPS Take 1 capsule by mouth 2 (two) times daily.  . Pramipexole Dihydrochloride 1.5 MG TB24 Take 1 tablet (1.5 mg total) by mouth daily.  . rosuvastatin (CRESTOR) 20 MG tablet Take 1 tablet (20 mg total) by mouth daily.  . traZODone (DESYREL) 50 MG tablet   . [DISCONTINUED] spironolactone (ALDACTONE) 25 MG tablet Take 12.5 mg by mouth daily.     Allergies:   Entresto [sacubitril-valsartan], Lipitor [atorvastatin], and Pregabalin   Social History   Socioeconomic History  . Marital status: Married    Spouse name: Not on file  . Number of children: Not on file  . Years of education: Not on file  . Highest education level: Not on file  Occupational History  . Not on file  Tobacco Use  . Smoking status: Never Smoker  . Smokeless tobacco: Never Used  Vaping Use  . Vaping Use: Never used  Substance and Sexual Activity  . Alcohol use: Not Currently    Comment: rare beer  . Drug use: No  . Sexual activity: Not on file  Other Topics Concern  . Not on file  Social History Narrative   Lives locally with wife.  Works in [employer].  Does not routinely exercise.   Social Determinants of Health   Financial Resource Strain: Not on file  Food Insecurity: Not on file  Transportation Needs: Not on file  Physical Activity: Not on file  Stress: Not on file  Social Connections: Not on file     Family History: The patient's family history includes CAD in his father; Cancer in his father and mother; Other in his brother.  ROS:   Please see the history of present illness.     All other systems reviewed and are negative.  EKGs/Labs/Other Studies Reviewed:    The following studies were reviewed today: LHC 28-Jan-2018  Prox RCA to Mid RCA lesion is 20% stenosed.  Dist RCA lesion is 100% stenosed.  Mid Cx to Dist Cx lesion is 90%  stenosed.  Prox LAD lesion is 60% stenosed.  Mid LAD lesion is 90% stenosed.  Ost 2nd Diag to 2nd Diag lesion is 85% stenosed.  Mid LM to Dist LM lesion is 20% stenosed.  Prox Cx lesion is 90% stenosed.   1.  Late presenting inferior ST elevation myocardial infarction of at least 48 hours duration.  The culprit is occluded distal right coronary artery with left to right collaterals noted especially to the right PDA.  In addition, there is diffuse diabetic three-vessel coronary artery disease affecting the proximal, mid and distal LAD as well as proximal and distal left circumflex. 2.  Moderately to severely elevated left ventricular end-diastolic  pressure with an LVEDP of 29 to 30 mmHg.  Left ventricular angiography was not performed given renal failure.  TTE 01/2018 - Left ventricle: The cavity size was mildly dilated. There was  mild concentric hypertrophy. Systolic function was moderately  reduced. The estimated ejection fraction was in the range of 35%  to 40%. Akinesis of the inferolateral and inferior myocardium.  Features are consistent with a pseudonormal left ventricular  filling pattern, with concomitant abnormal relaxation and  increased filling pressure (grade 2 diastolic dysfunction).  - Mitral valve: There was mild to moderate regurgitation directed  posteriorly.  - Left atrium: The atrium was mildly dilated.  - Right atrium: The atrium was mildly dilated.  - Pulmonary arteries: Systolic pressure was moderately increased.  PA peak pressure: 55 mm Hg (S).  - Inferior vena cava: The vessel was dilated. The respirophasic  diameter changes were blunted (< 50%), consistent with elevated  central venous pressure.   EKG:  EKG is  ordered today.  The ekg ordered today demonstrates sinus rhythm.  Recent Labs: 11/11/2019: BUN 23; Creatinine, Ser 1.42; Hemoglobin 15.6; Platelets 223; Potassium 3.7; Sodium 137  Recent Lipid Panel    Component Value Date/Time    CHOL 163 01/24/2018 1459   TRIG 120 01/24/2018 1459   HDL 23 (L) 01/24/2018 1459   CHOLHDL 7.1 01/24/2018 1459   VLDL 24 01/24/2018 1459   LDLCALC 116 (H) 01/24/2018 1459    Physical Exam:    VS:  BP 130/76 (BP Location: Left Arm, Patient Position: Sitting, Cuff Size: Normal)   Pulse 68   Ht 5\' 8"  (1.727 m)   Wt 194 lb (88 kg)   SpO2 97%   BMI 29.50 kg/m     Wt Readings from Last 3 Encounters:  08/06/20 194 lb (88 kg)  08/05/20 212 lb 3.2 oz (96.3 kg)  04/18/20 194 lb (88 kg)     GEN:  Well nourished, well developed in no acute distress HEENT: Normal NECK: No JVD; No carotid bruits LYMPHATICS: No lymphadenopathy CARDIAC: RRR, no murmurs, rubs, gallops RESPIRATORY:  Clear to auscultation without rales, wheezing or rhonchi  ABDOMEN: Soft, non-tender, non-distended MUSCULOSKELETAL:  trace edema; No deformity  SKIN: Warm and dry NEUROLOGIC:  Alert and oriented x 3 PSYCHIATRIC:  Normal affect   ASSESSMENT:    1. Coronary artery disease involving native coronary artery of native heart without angina pectoris   2. HFrEF (heart failure with reduced ejection fraction) ([place])   3. SVT (supraventricular tachycardia) ([place])   4. Essential hypertension    PLAN:    In order of problems listed above:  1. CAD status post CABG x3 in 2019. Denies cp.  Continue aspirin and Crestor. 2. ICM,heart failure moderately reduced EF. Trace edema on exam.  Last echo a yr ago with EF 40 to 45%.  Increase Aldactone to 25 mg daily, continue losartan 100 mg daily, Toprol-XL 50 mg daily, Lasix to 40 mg daily.  Check BMP in 1 week. Repeat echo 3. Blood pressure reasonably controlled.  Continue losartan 100 mg daily, Toprol-XL. Increase aldactone as above.  4. History of SVT.  Continue amiodarone 200 mg daily.  Follow-up in 2 months after repeat echo  Total encounter time 40 minutes  Greater than 50% was spent in counseling and coordination of care with the patient   Medication  Adjustments/Labs and Tests Ordered: Current medicines are reviewed at length with the patient today.  Concerns regarding medicines are outlined above.  Orders Placed This Encounter  Procedures  . Basic Metabolic Panel (BMET)  . EKG 12-Lead  . ECHOCARDIOGRAM COMPLETE   Meds ordered this encounter  Medications  . spironolactone (ALDACTONE) 25 MG tablet    Sig: Take 1 tablet (25 mg total) by mouth daily.    Dispense:  30 tablet    Refill:  5    Patient Instructions  Medication Instructions:  Your physician recommends that you continue on your current medications as directed. Please refer to the Current Medication list given to you today.  *If you need a refill on your cardiac medications before your next appointment, please call your pharmacy*   Lab Work: Your physician recommends that you return for lab work in: one week  - Please go to the [facility]. You will check in at the front desk to the right as you walk into the atrium. Valet Parking is offered if needed. - No appointment needed. You may go any day between 7 am and 6 pm.   If you have labs (blood work) drawn today and your tests are completely normal, you will receive your results only by: [name] MyChart Message (if you have MyChart) OR . A paper copy in the mail If you have any lab test that is abnormal or we need to change your treatment, we will call you to review the results.   Testing/Procedures: Your physician has requested that you have an echocardiogram in 2 months . Echocardiography is a painless test that uses sound waves to create images of your heart. It provides your doctor with information about the size and shape of your heart and how well your heart's chambers and valves are working. This procedure takes approximately one hour. There are no restrictions for this procedure.  Follow-Up: At [facility], you and your health needs are our priority.  As part of our continuing mission to provide you with  exceptional heart care, we have created designated Provider Care Teams.  These Care Teams include your primary Cardiologist (physician) and Advanced Practice Providers (APPs -  Physician Assistants and Nurse Practitioners) who all work together to provide you with the care you need, when you need it.  We recommend signing up for the patient portal called "MyChart".  Sign up information is provided on this After Visit Summary.  MyChart is used to connect with patients for Virtual Visits (Telemedicine).  Patients are able to view lab/test results, encounter notes, upcoming appointments, etc.  Non-urgent messages can be sent to your provider as well.   To learn more about what you can do with MyChart, go to [email or web address].    Your next appointment:   2 month(s)  The format for your next appointment:   In Person  Provider:   [name], MD   Other Instructions      Signed, [name], MD  08/06/2020 2:38 PM    [place]

## 2020-08-23 ENCOUNTER — Other Ambulatory Visit
Admission: RE | Admit: 2020-08-23 | Discharge: 2020-08-23 | Disposition: A | Discharge status: Home / Self Care | Payer: 59 | Attending: Cardiology | Admitting: Cardiology

## 2020-08-23 ENCOUNTER — Other Ambulatory Visit: Payer: Self-pay

## 2020-08-23 DIAGNOSIS — I5020 Unspecified systolic (congestive) heart failure: Secondary | ICD-10-CM | POA: Insufficient documentation

## 2020-08-23 DIAGNOSIS — I10 Essential (primary) hypertension: Secondary | ICD-10-CM

## 2020-08-23 LAB — BASIC METABOLIC PANEL
Anion gap: 13 (ref 5–15)
BUN: 34 mg/dL — ABNORMAL HIGH (ref 6–20)
CO2: 25 mmol/L (ref 22–32)
Calcium: 9.6 mg/dL (ref 8.9–10.3)
Chloride: 99 mmol/L (ref 98–111)
Creatinine, Ser: 1.57 mg/dL — ABNORMAL HIGH (ref 0.61–1.24)
GFR, Estimated: 50 mL/min — ABNORMAL LOW (ref >60)
Glucose, Bld: 172 mg/dL — ABNORMAL HIGH (ref 70–99)
Potassium: 4.9 mmol/L (ref 3.5–5.1)
Sodium: 137 mmol/L (ref 135–145)

## 2020-09-02 ENCOUNTER — Telehealth: Payer: Self-pay | Admitting: Cardiology

## 2020-09-02 MED ORDER — ROSUVASTATIN CALCIUM 20 MG PO TABS
20.0000 mg | ORAL_TABLET | Freq: Every day | ORAL | 3 refills | Status: DC
Start: 2020-09-02 — End: 2021-09-01

## 2020-09-02 MED ORDER — LOSARTAN POTASSIUM 100 MG PO TABS: 100.0000 mg | ORAL_TABLET | Freq: Every day | ORAL | 3 refills | Status: DC

## 2020-09-02 NOTE — Telephone Encounter (Signed)
Received fax from [facility] on [address] requesting refills for Rosuvastatin 20 mg and Losartan 100 mg. Rx request sent to pharmacy.

## 2020-09-30 ENCOUNTER — Ambulatory Visit (INDEPENDENT_AMBULATORY_CARE_PROVIDER_SITE_OTHER): Payer: 59 | Admitting: Internal Medicine

## 2020-09-30 ENCOUNTER — Other Ambulatory Visit: Payer: Self-pay

## 2020-09-30 ENCOUNTER — Encounter: Payer: Self-pay | Admitting: Internal Medicine

## 2020-09-30 VITALS — BP 139/74 | HR 78 | Ht 68.0 in | Wt 220.3 lb

## 2020-09-30 DIAGNOSIS — Z951 Presence of aortocoronary bypass graft: Secondary | ICD-10-CM | POA: Diagnosis not present

## 2020-09-30 DIAGNOSIS — Z0000 Encounter for general adult medical examination without abnormal findings: Secondary | ICD-10-CM

## 2020-09-30 DIAGNOSIS — E8881 Metabolic syndrome: Secondary | ICD-10-CM | POA: Insufficient documentation

## 2020-09-30 DIAGNOSIS — E119 Type 2 diabetes mellitus without complications: Secondary | ICD-10-CM

## 2020-09-30 DIAGNOSIS — R42 Dizziness and giddiness: Secondary | ICD-10-CM

## 2020-09-30 DIAGNOSIS — E7800 Pure hypercholesterolemia, unspecified: Secondary | ICD-10-CM | POA: Diagnosis not present

## 2020-09-30 LAB — GLUCOSE, POCT (MANUAL RESULT ENTRY): POC Glucose: 128 mg/dl — AB (ref 70–99)

## 2020-09-30 NOTE — Assessment & Plan Note (Signed)

## 2020-09-30 NOTE — Assessment & Plan Note (Signed)
-   The patient's hyperlipidemia is stable on med. - The patient will continue the current treatment regimen.  - I encouraged the patient to eat more vegetables and whole wheat, and to avoid fatty foods like whole milk, hard cheese, egg yolks, margarine, baked sweets, and fried foods.  - I encouraged the patient to live an active lifestyle and complete activities for 40 minutes at least three times per week.  - I instructed the patient to go to the ER if they begin having chest pain.

## 2020-09-30 NOTE — Addendum Note (Signed)
Addended by: Lacretia Nicks L on: 09/30/2020 11:10 AM   Modules accepted: Orders

## 2020-09-30 NOTE — Assessment & Plan Note (Signed)
-   I encouraged the patient to lose weight.  - I educated them on making healthy dietary choices including eating more fruits and vegetables and less fried foods. - I encouraged the patient to exercise more, and educated on the benefits of exercise including weight loss, diabetes prevention, and hypertension prevention.   

## 2020-09-30 NOTE — Assessment & Plan Note (Signed)
No chest pain

## 2020-09-30 NOTE — Assessment & Plan Note (Signed)
stable °

## 2020-09-30 NOTE — Progress Notes (Signed)
Established Patient Office Visit  Subjective:  Patient ID: Ricardo Brown, male    DOB: 14-Apr-1961  Age: 60 y.o. MRN: [id number]  CC:  Chief Complaint  Patient presents with  . Annual Exam    HPI Patient is known to have hypertension.  Intermittent atrial fibrillation right ventricular dysfunction diabetes mellitus has a history of bypass surgery in the past.  She denies any chest pain or shortness of breath.  Ricardo Brown presents for  Past Medical History:  Diagnosis Date  . CHF (congestive heart failure) (Ricardo Brown)   . Coronary artery disease   . Diabetes mellitus without complication ([name])    a. Dx ~ 2000.  Ricardo Brown Heart palpitations    a. Pt reports prior nl echo's and stress tests. Last stress test w/in past 2 yrs - PCP.  Ricardo Brown High cholesterol   . Hypertension   . Myocardial infarction Ricardo Brown)     Past Surgical History:  Procedure Laterality Date  . CARDIAC CATHETERIZATION    . CARDIAC VALVE REPLACEMENT     Mitral Valve Repair  . COLONOSCOPY WITH PROPOFOL N/A 06/07/2019   Procedure: COLONOSCOPY WITH PROPOFOL;  Surgeon: Ricardo Manifold, MD;  Location: [facility] ENDOSCOPY;  Service: Endoscopy;  Laterality: N/A;  . COLONOSCOPY WITH PROPOFOL N/A 02/07/2020   Procedure: COLONOSCOPY WITH PROPOFOL;  Surgeon: Ricardo Manifold, MD;  Location: [facility] ENDOSCOPY;  Service: Endoscopy;  Laterality: N/A;  . CORONARY ANGIOPLASTY    . CORONARY ARTERY BYPASS GRAFT     x 3   01/2018  . ESOPHAGOGASTRODUODENOSCOPY N/A 02/07/2020   Procedure: ESOPHAGOGASTRODUODENOSCOPY (EGD);  Surgeon: Ricardo Manifold, MD;  Location: [facility] ENDOSCOPY;  Service: Endoscopy;  Laterality: N/A;  . LEFT HEART CATH AND CORONARY ANGIOGRAPHY N/A 01/24/2018   Procedure: LEFT HEART CATH AND CORONARY ANGIOGRAPHY;  Surgeon: Ricardo Hampshire, MD;  Location: [place] [facility];  Service: Cardiovascular;  Laterality: N/A;  . TONSILLECTOMY      Family History  Problem Relation Age of Onset  . Cancer Mother         died @ 76  . CAD Father        First MI @ 23. S/p heart transplant. Died in mid-50's of cancer.  . Cancer Father   . Other Brother        alive and well    Social History   Socioeconomic History  . Marital status: Married    Spouse name: Not on file  . Number of children: Not on file  . Years of education: Not on file  . Highest education level: Not on file  Occupational History  . Not on file  Tobacco Use  . Smoking status: Never Smoker  . Smokeless tobacco: Never Used  Vaping Use  . Vaping Use: Never used  Substance and Sexual Activity  . Alcohol use: Not Currently    Comment: rare beer  . Drug use: No  . Sexual activity: Not on file  Other Topics Concern  . Not on file  Social History Narrative   Lives locally with wife.  Works in [employer].  Does not routinely exercise.   Social Determinants of Health   Financial Resource Strain: Not on file  Food Insecurity: Not on file  Transportation Needs: Not on file  Physical Activity: Not on file  Stress: Not on file  Social Connections: Not on file  Intimate Partner Violence: Not on file     Current Outpatient Medications:  .  amiodarone ([place])  200 MG tablet, Take 1 tablet (200 mg total) by mouth daily., Disp: 90 tablet, Rfl: 2 .  aspirin 81 MG EC tablet, Take 81 mg by mouth daily. , Disp: , Rfl:  .  b complex vitamins tablet, Take 1 tablet by mouth daily. , Disp: , Rfl:  .  cetirizine (ZYRTEC) 10 MG tablet, Take 1 tablet (10 mg total) by mouth daily., Disp: 30 tablet, Rfl: 6 .  Dulaglutide 3 MG/0.5ML SOPN, Inject into the skin once a week. , Disp: , Rfl:  .  furosemide (LASIX) 20 MG tablet, Take 2 tablets (40 mg total) by mouth daily., Disp: 75 tablet, Rfl: 5 .  losartan (COZAAR) 100 MG tablet, Take 1 tablet (100 mg total) by mouth daily., Disp: 90 tablet, Rfl: 3 .  metFORMIN (GLUCOPHAGE) 500 MG tablet, Take 2 tablets (1,000 mg total) by mouth in the morning and at bedtime., Disp: 180 tablet, Rfl: 3 .   metoprolol succinate (TOPROL-XL) 50 MG 24 hr tablet, Take 50 mg by mouth daily. Take with or immediately following a meal., Disp: , Rfl:  .  Omega-3 Fatty Acids (FISH OIL) 875 MG CAPS, Take 1 capsule by mouth 2 (two) times daily., Disp: , Rfl:  .  Pramipexole Dihydrochloride 1.5 MG TB24, Take 1 tablet (1.5 mg total) by mouth daily., Disp: 30 tablet, Rfl: 6 .  rosuvastatin (CRESTOR) 20 MG tablet, Take 1 tablet (20 mg total) by mouth daily., Disp: 90 tablet, Rfl: 3 .  spironolactone (ALDACTONE) 25 MG tablet, Take 1 tablet (25 mg total) by mouth daily., Disp: 30 tablet, Rfl: 5 .  traZODone (DESYREL) 50 MG tablet, , Disp: , Rfl:    Allergies  Allergen Reactions  . Entresto [Sacubitril-Valsartan] Other (See Comments)    SVT  . Lipitor [Atorvastatin] Palpitations    SVT  . Pregabalin Other (See Comments)    ROS Review of Systems  Constitutional: Negative.   HENT: Negative.   Eyes: Negative.   Respiratory: Negative.   Cardiovascular: Negative.   Gastrointestinal: Negative.   Endocrine: Negative.   Genitourinary: Negative.   Musculoskeletal: Negative.   Skin: Negative.   Allergic/Immunologic: Negative.   Neurological: Negative.   Hematological: Negative.   Psychiatric/Behavioral: Negative.   All other systems reviewed and are negative.     Objective:    Physical Exam Vitals reviewed.  Constitutional:      Appearance: Normal appearance.  HENT:     Mouth/Throat:     Mouth: Mucous membranes are moist.  Eyes:     Pupils: Pupils are equal, round, and reactive to light.  Neck:     Vascular: No carotid bruit.  Cardiovascular:     Rate and Rhythm: Normal rate and regular rhythm.     Pulses: Normal pulses.     Heart sounds: Normal heart sounds.  Pulmonary:     Effort: Pulmonary effort is normal.     Breath sounds: Normal breath sounds.  Abdominal:     General: Bowel sounds are normal.     Palpations: Abdomen is soft. There is no hepatomegaly, splenomegaly or mass.      Tenderness: There is no abdominal tenderness.     Hernia: No hernia is present.  Musculoskeletal:     Cervical back: Neck supple.     Right lower leg: No edema.     Left lower leg: No edema.  Skin:    Findings: No rash.  Neurological:     Mental Status: He is alert and oriented to person, place, and  time.     Motor: No weakness.  Psychiatric:        Mood and Affect: Mood normal.        Behavior: Behavior normal.     BP 139/74   Pulse 78   Ht 5\' 8"  (1.727 m)   Wt 220 lb 4.8 oz (99.9 kg)   BMI 33.50 kg/m  Wt Readings from Last 3 Encounters:  09/30/20 220 lb 4.8 oz (99.9 kg)  08/06/20 194 lb (88 kg)  08/05/20 212 lb 3.2 oz (96.3 kg)     Health Maintenance Due  Topic Date Due  . HEMOGLOBIN A1C  Never done  . Hepatitis C Screening  Never done  . PNEUMOCOCCAL POLYSACCHARIDE VACCINE AGE 4-64 HIGH RISK  Never done  . COVID-19 Vaccine (1) Never done  . FOOT EXAM  Never done  . OPHTHALMOLOGY EXAM  Never done  . HIV Screening  Never done  . TETANUS/TDAP  Never done  . INFLUENZA VACCINE  Never done    There are no preventive care reminders to display for this patient.  Lab Results  Component Value Date   TSH 2.162 04/23/2015   Lab Results  Component Value Date   WBC 10.6 (H) 11/11/2019   HGB 15.6 11/11/2019   HCT 44.7 11/11/2019   MCV 87.0 11/11/2019   PLT 223 11/11/2019   Lab Results  Component Value Date   NA 137 08/23/2020   K 4.9 08/23/2020   CO2 25 08/23/2020   GLUCOSE 172 (H) 08/23/2020   BUN 34 (H) 08/23/2020   CREATININE 1.57 (H) 08/23/2020   BILITOT 1.1 01/24/2018   ALKPHOS 29 (L) 01/24/2018   AST 40 01/24/2018   ALT 30 01/24/2018   PROT 7.8 01/24/2018   ALBUMIN 3.8 01/24/2018   CALCIUM 9.6 08/23/2020   ANIONGAP 13 08/23/2020   Lab Results  Component Value Date   CHOL 163 01/24/2018   Lab Results  Component Value Date   HDL 23 (L) 01/24/2018   Lab Results  Component Value Date   LDLCALC 116 (H) 01/24/2018   Lab Results  Component  Value Date   TRIG 120 01/24/2018   Lab Results  Component Value Date   CHOLHDL 7.1 01/24/2018   No results found for: HGBA1C    Assessment & Plan:   Problem List Items Addressed This Visit      Endocrine   Diabetes mellitus type 2, uncomplicated ([place]) - Primary    - The patient's blood sugar is under control on med. - The patient will continue the current treatment regimen.  - I encouraged the patient to regularly check blood sugar.  - I encouraged the patient to monitor diet. I encouraged the patient to eat low-carb and low-sugar to help prevent blood sugar spikes.  - I encouraged the patient to continue following their prescribed treatment plan for diabetes - I informed the patient to get help if blood sugar drops below 54mg /dL, or if suddenly have trouble thinking clearly or breathing.          Relevant Orders   POCT glucose (manual entry) (Completed)     Other   HLD (hyperlipidemia)    - The patient's hyperlipidemia is stable on med. - The patient will continue the current treatment regimen.  - I encouraged the patient to eat more vegetables and whole wheat, and to avoid fatty foods like whole milk, hard cheese, egg yolks, margarine, baked sweets, and fried foods.  - I encouraged the patient to live an  active lifestyle and complete activities for 40 minutes at least three times per week.  - I instructed the patient to go to the ER if they begin having chest pain.       S/P CABG x 3    No chest pain      Vertigo    stable      Metabolic syndrome    - I encouraged the patient to lose weight.  - I educated them on making healthy dietary choices including eating more fruits and vegetables and less fried foods. - I encouraged the patient to exercise more, and educated on the benefits of exercise including weight loss, diabetes prevention, and hypertension prevention.          No orders of the defined types were placed in this encounter.   Follow-up: No  follow-ups on file.    [name], MD

## 2020-10-01 LAB — COMPLETE METABOLIC PANEL WITH GFR
AG Ratio: 2.1 (calc) (ref 1.0–2.5)
ALT: 26 U/L (ref 9–46)
AST: 27 U/L (ref 10–35)
Albumin: 4.4 g/dL (ref 3.6–5.1)
Alkaline phosphatase (APISO): 40 U/L (ref 35–144)
BUN: 23 mg/dL (ref 7–25)
CO2: 24 mmol/L (ref 20–32)
Calcium: 9 mg/dL (ref 8.6–10.3)
Chloride: 102 mmol/L (ref 98–110)
Creat: 1.31 mg/dL (ref 0.70–1.33)
GFR, Est African American: 69 mL/min/{1.73_m2} (ref >=60)
GFR, Est Non African American: 59 mL/min/{1.73_m2} — ABNORMAL LOW (ref >=60)
Globulin: 2.1 g/dL (calc) (ref 1.9–3.7)
Glucose, Bld: 106 mg/dL — ABNORMAL HIGH (ref 65–99)
Potassium: 4.4 mmol/L (ref 3.5–5.3)
Sodium: 137 mmol/L (ref 135–146)
Total Bilirubin: 0.4 mg/dL (ref 0.2–1.2)
Total Protein: 6.5 g/dL (ref 6.1–8.1)

## 2020-10-01 LAB — LIPID PANEL
Cholesterol: 122 mg/dL (ref <200)
HDL: 35 mg/dL — ABNORMAL LOW (ref >=40)
LDL Cholesterol (Calc): 67 mg/dL (calc)
Non-HDL Cholesterol (Calc): 87 mg/dL (calc) (ref <130)
Total CHOL/HDL Ratio: 3.5 (calc) (ref <5.0)
Triglycerides: 121 mg/dL (ref <150)

## 2020-10-01 LAB — TSH: TSH: 2.53 mIU/L (ref 0.40–4.50)

## 2020-10-01 LAB — CBC WITH DIFFERENTIAL/PLATELET
Absolute Monocytes: 524 cells/uL (ref 200–950)
Basophils Absolute: 69 cells/uL (ref 0–200)
Basophils Relative: 0.9 %
Eosinophils Absolute: 193 cells/uL (ref 15–500)
Eosinophils Relative: 2.5 %
HCT: 40.7 % (ref 38.5–50.0)
Hemoglobin: 13.7 g/dL (ref 13.2–17.1)
Lymphs Abs: 1709 cells/uL (ref 850–3900)
MCH: 29.8 pg (ref 27.0–33.0)
MCHC: 33.7 g/dL (ref 32.0–36.0)
MCV: 88.5 fL (ref 80.0–100.0)
MPV: 9.9 fL (ref 7.5–12.5)
Monocytes Relative: 6.8 %
Neutro Abs: 5205 cells/uL (ref 1500–7800)
Neutrophils Relative %: 67.6 %
Platelets: 214 10*3/uL (ref 140–400)
RBC: 4.6 10*6/uL (ref 4.20–5.80)
RDW: 12.6 % (ref 11.0–15.0)
Total Lymphocyte: 22.2 %
WBC: 7.7 10*3/uL (ref 3.8–10.8)

## 2020-10-01 LAB — HM DIABETES EYE EXAM

## 2020-10-01 LAB — PSA: PSA: 0.38 ng/mL (ref <=4.0)

## 2020-10-08 ENCOUNTER — Other Ambulatory Visit: Payer: 59

## 2020-10-11 ENCOUNTER — Ambulatory Visit: Payer: 59 | Admitting: Cardiology

## 2020-10-24 ENCOUNTER — Other Ambulatory Visit: Payer: Self-pay | Admitting: Internal Medicine

## 2020-10-29 ENCOUNTER — Ambulatory Visit (INDEPENDENT_AMBULATORY_CARE_PROVIDER_SITE_OTHER): Payer: 59

## 2020-10-29 ENCOUNTER — Other Ambulatory Visit: Payer: Self-pay

## 2020-10-29 DIAGNOSIS — I5020 Unspecified systolic (congestive) heart failure: Secondary | ICD-10-CM | POA: Diagnosis not present

## 2020-10-30 LAB — ECHOCARDIOGRAM COMPLETE
Area-P 1/2: 4.65 cm2
MV VTI: 1.74 cm2
S' Lateral: 4.5 cm

## 2020-11-04 ENCOUNTER — Ambulatory Visit: Payer: Self-pay | Admitting: Internal Medicine

## 2020-11-04 ENCOUNTER — Other Ambulatory Visit: Payer: Self-pay

## 2020-11-04 MED ORDER — FUROSEMIDE 20 MG PO TABS: 40.0000 mg | ORAL_TABLET | Freq: Every day | ORAL | 2 refills | Status: DC

## 2020-11-07 ENCOUNTER — Encounter: Payer: Self-pay | Admitting: Cardiology

## 2020-11-07 ENCOUNTER — Ambulatory Visit (INDEPENDENT_AMBULATORY_CARE_PROVIDER_SITE_OTHER): Payer: 59 | Admitting: Cardiology

## 2020-11-07 ENCOUNTER — Other Ambulatory Visit: Payer: Self-pay

## 2020-11-07 ENCOUNTER — Ambulatory Visit: Payer: Self-pay | Admitting: Family Medicine

## 2020-11-07 VITALS — BP 136/66 | HR 81 | Ht 68.0 in | Wt 214.0 lb

## 2020-11-07 DIAGNOSIS — Z951 Presence of aortocoronary bypass graft: Secondary | ICD-10-CM

## 2020-11-07 DIAGNOSIS — I10 Essential (primary) hypertension: Secondary | ICD-10-CM | POA: Diagnosis not present

## 2020-11-07 DIAGNOSIS — I471 Supraventricular tachycardia, unspecified: Secondary | ICD-10-CM

## 2020-11-07 DIAGNOSIS — I5020 Unspecified systolic (congestive) heart failure: Secondary | ICD-10-CM | POA: Diagnosis not present

## 2020-11-07 NOTE — Progress Notes (Signed)
Cardiology Office Note:    Date:  11/07/2020   ID:  Ricardo Brown, DOB 03-20-1961, MRN [id number]  PCP:  [name], MD  Cardiologist:  [name], MD  Electrophysiologist:  None   Referring MD: [name], MD   Chief Complaint  Patient presents with  . Other    2 month follow up - Aptient c.o SOB when he is doing "something physical" Meds reviewed verbally with patient.      History of Present Illness:    Ricardo Brown is a 60 y.o. male with a hx of SVT on amiodarone, CAD/CABG x3 in 2019, HFrEF EF 40-45%, hypertension, hyperlipidemia, diabetes, family history of premature CAD in the father (MI in his 52s) presents for follow-up.    He is being seen for ischemic cardiomyopathy. Aldactone was increased to 25mg  qd after last visit and repeat echocardiogram was ordered to evaluate systolic function.  Denies chest pain , endorses some shortness of breath when he overexerts himself shortness of breath.  Takes all medications as prescribed.  Prior notes Patient was seen in the [facility] in 06/ 2019 due to nausea, weakness, myalgias.  EKG at the time showed inferior ST elevation with reciprocal changes in the anterior leads and small inferior Q waves.  Patient was deemed late presenting STEMI.   Left heart cath was performed, briefly showed occluded distal RCA, 90% mid LAD, 90% proximal left circumflex.  Due to patient being a diabetic, and left heart cath showing diffuse three-vessel disease, CABG was recommended.  He underwent coronary artery bypass grafting x3 in July 2019 at [facility].  Delene Loll previously tried on patient, he did not tolerate.  He developed feeling of unwell, went into SVT and was taken to the emergency room.  Patient with history of SVT, who was seen in the emergency room and adenosine given x2 and placed on amiodarone  Echo on 09/2019 showed mild to moderately reduced ejection fraction, EF 40 to 45%.  Past Medical History:   Diagnosis Date  . CHF (congestive heart failure) ([place])   . Coronary artery disease   . Diabetes mellitus without complication ([place])    a. Dx ~ 2000.  [name] Heart palpitations    a. Pt reports prior nl echo's and stress tests. Last stress test w/in past 2 yrs - PCP.  [name] High cholesterol   . Hypertension   . Myocardial infarction [facility])     Past Surgical History:  Procedure Laterality Date  . CARDIAC CATHETERIZATION    . CARDIAC VALVE REPLACEMENT     Mitral Valve Repair  . COLONOSCOPY WITH PROPOFOL N/A 06/07/2019   Procedure: COLONOSCOPY WITH PROPOFOL;  Surgeon: [name], MD;  Location: [facility] ENDOSCOPY;  Service: Endoscopy;  Laterality: N/A;  . COLONOSCOPY WITH PROPOFOL N/A 02/07/2020   Procedure: COLONOSCOPY WITH PROPOFOL;  Surgeon: [name], MD;  Location: [facility] ENDOSCOPY;  Service: Endoscopy;  Laterality: N/A;  . CORONARY ANGIOPLASTY    . CORONARY ARTERY BYPASS GRAFT     x 3   01/2018  . ESOPHAGOGASTRODUODENOSCOPY N/A 02/07/2020   Procedure: ESOPHAGOGASTRODUODENOSCOPY (EGD);  Surgeon: [name], MD;  Location: [facility] ENDOSCOPY;  Service: Endoscopy;  Laterality: N/A;  . LEFT HEART CATH AND CORONARY ANGIOGRAPHY N/A 01/24/2018   Procedure: LEFT HEART CATH AND CORONARY ANGIOGRAPHY;  Surgeon: [name], MD;  Location: [place] CV LAB;  Service: Cardiovascular;  Laterality: N/A;  . TONSILLECTOMY      Current Medications: Current Meds  Medication Sig  .  amiodarone (PACERONE) 200 MG tablet Take 1 tablet (200 mg total) by mouth daily.  [name] aspirin 81 MG EC tablet Take 81 mg by mouth daily.   [name] b complex vitamins tablet Take 1 tablet by mouth daily.   . Dulaglutide 1.5 MG/0.5ML SOPN Inject into the skin once a week.   . furosemide (LASIX) 20 MG tablet Take 2 tablets (40 mg total) by mouth daily.  [name] losartan (COZAAR) 100 MG tablet Take 1 tablet (100 mg total) by mouth daily.  . metFORMIN (GLUCOPHAGE) 500 MG tablet TAKE 2 TABLETS BY MOUTH IN THE MORNING AND  AT BEDTIME  . metoprolol succinate (TOPROL-XL) 50 MG 24 hr tablet Take 50 mg by mouth daily. Take with or immediately following a meal.  . Omega-3 Fatty Acids (FISH OIL) 875 MG CAPS Take 1 capsule by mouth 2 (two) times daily.  . Pramipexole Dihydrochloride 1.5 MG TB24 Take 1 tablet (1.5 mg total) by mouth daily.  . rosuvastatin (CRESTOR) 20 MG tablet Take 1 tablet (20 mg total) by mouth daily.  [name] spironolactone (ALDACTONE) 25 MG tablet Take 1 tablet (25 mg total) by mouth daily.  . traZODone (DESYREL) 50 MG tablet      Allergies:   Entresto [sacubitril-valsartan], Lipitor [atorvastatin], and Pregabalin   Social History   Socioeconomic History  . Marital status: Married    Spouse name: Not on file  . Number of children: Not on file  . Years of education: Not on file  . Highest education level: Not on file  Occupational History  . Not on file  Tobacco Use  . Smoking status: Never Smoker  . Smokeless tobacco: Never Used  Vaping Use  . Vaping Use: Never used  Substance and Sexual Activity  . Alcohol use: Not Currently    Comment: rare beer  . Drug use: No  . Sexual activity: Not on file  Other Topics Concern  . Not on file  Social History Narrative   Lives locally with wife.  Works in [employer].  Does not routinely exercise.   Social Determinants of Health   Financial Resource Strain: Not on file  Food Insecurity: Not on file  Transportation Needs: Not on file  Physical Activity: Not on file  Stress: Not on file  Social Connections: Not on file     Family History: The patient's family history includes CAD in his father; Cancer in his father and mother; Other in his brother.  ROS:   Please see the history of present illness.     All other systems reviewed and are negative.  EKGs/Labs/Other Studies Reviewed:    The following studies were reviewed today: LHC 02/04/2018  Prox RCA to Mid RCA lesion is 20% stenosed.  Dist RCA lesion is 100% stenosed.  Mid Cx to  Dist Cx lesion is 90% stenosed.  Prox LAD lesion is 60% stenosed.  Mid LAD lesion is 90% stenosed.  Ost 2nd Diag to 2nd Diag lesion is 85% stenosed.  Mid LM to Dist LM lesion is 20% stenosed.  Prox Cx lesion is 90% stenosed.   1.  Late presenting inferior ST elevation myocardial infarction of at least 48 hours duration.  The culprit is occluded distal right coronary artery with left to right collaterals noted especially to the right PDA.  In addition, there is diffuse diabetic three-vessel coronary artery disease affecting the proximal, mid and distal LAD as well as proximal and distal left circumflex. 2.  Moderately to severely elevated left ventricular end-diastolic pressure  with an LVEDP of 29 to 30 mmHg.  Left ventricular angiography was not performed given renal failure.  TTE 01/2018 - Left ventricle: The cavity size was mildly dilated. There was  mild concentric hypertrophy. Systolic function was moderately  reduced. The estimated ejection fraction was in the range of 35%  to 40%. Akinesis of the inferolateral and inferior myocardium.  Features are consistent with a pseudonormal left ventricular  filling pattern, with concomitant abnormal relaxation and  increased filling pressure (grade 2 diastolic dysfunction).  - Mitral valve: There was mild to moderate regurgitation directed  posteriorly.  - Left atrium: The atrium was mildly dilated.  - Right atrium: The atrium was mildly dilated.  - Pulmonary arteries: Systolic pressure was moderately increased.  PA peak pressure: 55 mm Hg (S).  - Inferior vena cava: The vessel was dilated. The respirophasic  diameter changes were blunted (< 50%), consistent with elevated  central venous pressure.   EKG:  EKG is  ordered today.  The ekg ordered today demonstrates sinus rhythm, right bundle branch block  Recent Labs: 09/30/2020: ALT 26; BUN 23; Creat 1.31; Hemoglobin 13.7; Platelets 214; Potassium 4.4; Sodium 137; TSH  2.53  Recent Lipid Panel    Component Value Date/Time   CHOL 122 09/30/2020 1114   TRIG 121 09/30/2020 1114   HDL 35 (L) 09/30/2020 1114   CHOLHDL 3.5 09/30/2020 1114   VLDL 24 01/24/2018 1459   LDLCALC 67 09/30/2020 1114    Physical Exam:    VS:  BP 136/66 (BP Location: Left Arm, Patient Position: Sitting, Cuff Size: Normal)   Pulse 81   Ht 5\' 8"  (1.727 m)   Wt 214 lb (97.1 kg)   SpO2 97%   BMI 32.54 kg/m     Wt Readings from Last 3 Encounters:  11/07/20 214 lb (97.1 kg)  09/30/20 220 lb 4.8 oz (99.9 kg)  08/06/20 194 lb (88 kg)     GEN:  Well nourished, well developed in no acute distress HEENT: Normal NECK: No JVD; No carotid bruits LYMPHATICS: No lymphadenopathy CARDIAC: RRR, no murmurs, rubs, gallops RESPIRATORY:  Clear to auscultation without rales, wheezing or rhonchi  ABDOMEN: Soft, non-tender, non-distended MUSCULOSKELETAL:  trace edema; No deformity  SKIN: Warm and dry NEUROLOGIC:  Alert and oriented x 3 PSYCHIATRIC:  Normal affect   ASSESSMENT:    1. Hx of CABG   2. Primary hypertension   3. SVT (supraventricular tachycardia) ([facility])   4. HFrEF (heart failure with reduced ejection fraction) ([place])    PLAN:    In order of problems listed above:  1. CAD status post CABG x3 in 2019. Denies chest pain.  Continue aspirin and Crestor. 2. ICM, echo 09/2019 EF 40 to 45%.  Repeat echo 10/2020 showed stable ejection fraction of 40 to 45%.  Describes NYHA class II-Brown symptoms.  Continue Aldactone 25 mg daily, losartan 100 mg daily, Toprol-XL 50 mg daily, Lasix to 40 mg daily.  Patient previously did not tolerate Entresto.  He went into SVT and had to be taken to the ED. 3. Blood pressure reasonably controlled.  Continue losartan 100 mg daily, Toprol-XL, Aldactone 25 mg daily 4. History of SVT.  Continue amiodarone 200 mg daily.  Follow-up in 6 months  Total encounter time 40 minutes  Greater than 50% was spent in counseling and coordination of care with the  patient   Medication Adjustments/Labs and Tests Ordered: Current medicines are reviewed at length with the patient today.  Concerns regarding medicines are outlined  above.  Orders Placed This Encounter  Procedures  . EKG 12-Lead   No orders of the defined types were placed in this encounter.   Patient Instructions  Medication Instructions:  Your physician recommends that you continue on your current medications as directed. Please refer to the Current Medication list given to you today.  *If you need a refill on your cardiac medications before your next appointment, please call your pharmacy*   Lab Work: None ordered If you have labs (blood work) drawn today and your tests are completely normal, you will receive your results only by: [name] MyChart Message (if you have MyChart) OR . A paper copy in the mail If you have any lab test that is abnormal or we need to change your treatment, we will call you to review the results.   Testing/Procedures: None ordered   Follow-Up: At [facility], you and your health needs are our priority.  As part of our continuing mission to provide you with exceptional heart care, we have created designated Provider Care Teams.  These Care Teams include your primary Cardiologist (physician) and Advanced Practice Providers (APPs -  Physician Assistants and Nurse Practitioners) who all work together to provide you with the care you need, when you need it.  We recommend signing up for the patient portal called "MyChart".  Sign up information is provided on this After Visit Summary.  MyChart is used to connect with patients for Virtual Visits (Telemedicine).  Patients are able to view lab/test results, encounter notes, upcoming appointments, etc.  Non-urgent messages can be sent to your provider as well.   To learn more about what you can do with MyChart, go to [email or web address].    Your next appointment:   6 month(s)  The format for your next  appointment:   In Person  Provider:   [name], MD   Other Instructions      Signed, [name], MD  11/07/2020 12:29 PM    [place]

## 2020-11-07 NOTE — Patient Instructions (Signed)

## 2020-11-27 ENCOUNTER — Telehealth: Payer: Self-pay | Admitting: Cardiology

## 2020-11-27 ENCOUNTER — Other Ambulatory Visit: Payer: Self-pay

## 2020-11-27 ENCOUNTER — Telehealth (INDEPENDENT_AMBULATORY_CARE_PROVIDER_SITE_OTHER): Payer: Self-pay | Admitting: Gastroenterology

## 2020-11-27 DIAGNOSIS — Z86010 Personal history of colonic polyps: Secondary | ICD-10-CM

## 2020-11-27 MED ORDER — NA SULFATE-K SULFATE-MG SULF 17.5-3.13-1.6 GM/177ML PO SOLN: 1.0000 | Freq: Once | ORAL | 0 refills | Status: AC

## 2020-11-27 NOTE — Telephone Encounter (Signed)
   [facility] Pre-operative Risk Assessment    Patient Name: Ricardo Brown  DOB: 1960/11/26  MRN: [id number]   HEARTCARE STAFF: - Please ensure there is not already an duplicate clearance open for this procedure. - Under Visit Info/Reason for Call, type in Other and utilize the format Clearance MM/DD/YY or Clearance TBD. Do not use dashes or single digits. - If request is for dental extraction, please clarify the # of teeth to be extracted.  Request for surgical clearance:  1. What type of surgery is being performed? Colonoscopy    2. When is this surgery scheduled? 12/11/20  3. What type of clearance is required (medical clearance vs. Pharmacy clearance to hold med vs. Both)? both  4. Are there any medications that need to be held prior to surgery and how long? Not listed, please advise if needed  5. Practice name and name of physician performing surgery? [facility] - Dr Bonna Gains   6. What is the office phone number? [phone]   7.   What is the office fax number? [phone]  8.   Anesthesia type (None, local, MAC, general) ? Not listed    Ace Gins 11/27/2020, 2:46 PM  _________________________________________________________________   (provider comments below)

## 2020-11-27 NOTE — Progress Notes (Signed)
Gastroenterology Pre-Procedure Review  Request Date: 12/11/20 Requesting Physician: Dr. Bonna Gains  PATIENT REVIEW QUESTIONS: The patient responded to the following health history questions as indicated:    1. Are you having any GI issues? no 2. Do you have a personal history of Polyps? yes (02/07/20 performed by Dr. Bonna Gains) 3. Do you have a family history of Colon Cancer or Polyps? no 4. Diabetes Mellitus? no 5. Joint replacements in the past 12 months?no 6. Major health problems in the past 3 months?Patient hasCHF Cardiac Clearance will be sent to Cardiologist 7. Any artificial heart valves, MVP, or defibrillator?no    MEDICATIONS & ALLERGIES:    Patient reports the following regarding taking any anticoagulation/antiplatelet therapy:   Plavix, Coumadin, Eliquis, Xarelto, Lovenox, Pradaxa, Brilinta, or Effient? no Aspirin? yes (81 mg daily)  Patient confirms/reports the following medications:  Current Outpatient Medications  Medication Sig Dispense Refill  . amiodarone (PACERONE) 200 MG tablet Take 1 tablet (200 mg total) by mouth daily. 90 tablet 2  . aspirin 81 MG EC tablet Take 81 mg by mouth daily.     [name] b complex vitamins tablet Take 1 tablet by mouth daily.     . Dulaglutide 1.5 MG/0.5ML SOPN Inject into the skin once a week.     . furosemide (LASIX) 20 MG tablet Take 2 tablets (40 mg total) by mouth daily. 180 tablet 2  . losartan (COZAAR) 100 MG tablet Take 1 tablet (100 mg total) by mouth daily. 90 tablet 3  . metFORMIN (GLUCOPHAGE) 500 MG tablet TAKE 2 TABLETS BY MOUTH IN THE MORNING AND AT BEDTIME 360 tablet 0  . metoprolol succinate (TOPROL-XL) 50 MG 24 hr tablet Take 50 mg by mouth daily. Take with or immediately following a meal.    . Omega-3 Fatty Acids (FISH OIL) 875 MG CAPS Take 1 capsule by mouth 2 (two) times daily.    . Pramipexole Dihydrochloride 1.5 MG TB24 Take 1 tablet (1.5 mg total) by mouth daily. 30 tablet 6  . rosuvastatin (CRESTOR) 20 MG tablet Take 1  tablet (20 mg total) by mouth daily. 90 tablet 3  . spironolactone (ALDACTONE) 25 MG tablet Take 1 tablet (25 mg total) by mouth daily. 30 tablet 5  . traZODone (DESYREL) 50 MG tablet      No current facility-administered medications for this visit.    Patient confirms/reports the following allergies:  Allergies  Allergen Reactions  . Entresto [Sacubitril-Valsartan] Other (See Comments)    SVT  . Lipitor [Atorvastatin] Palpitations    SVT  . Pregabalin Other (See Comments)    Orders Placed This Encounter  Procedures  . Procedural/ Surgical Case Request: COLONOSCOPY WITH PROPOFOL    Standing Status:   Standing    Number of Occurrences:   1    Order Specific Question:   Pre-op diagnosis    Answer:   personal history of colon polyps    Order Specific Question:   CPT Code    Answer:   16109    AUTHORIZATION INFORMATION Primary Insurance: 1D#: Group #:  Secondary Insurance: 1D#: Group #:  SCHEDULE INFORMATION: Date: 12/11/20 Time: Location:[facility]

## 2020-11-27 NOTE — Telephone Encounter (Signed)
Left a message for the patient to call back and speak to the on-call preop APP of the day 

## 2020-11-28 ENCOUNTER — Ambulatory Visit (INDEPENDENT_AMBULATORY_CARE_PROVIDER_SITE_OTHER): Payer: 59 | Admitting: Internal Medicine

## 2020-11-28 ENCOUNTER — Other Ambulatory Visit: Payer: Self-pay

## 2020-11-28 ENCOUNTER — Encounter: Payer: Self-pay | Admitting: Internal Medicine

## 2020-11-28 VITALS — BP 133/77 | HR 77 | Ht 68.0 in | Wt 212.9 lb

## 2020-11-28 DIAGNOSIS — Z951 Presence of aortocoronary bypass graft: Secondary | ICD-10-CM

## 2020-11-28 DIAGNOSIS — I10 Essential (primary) hypertension: Secondary | ICD-10-CM

## 2020-11-28 DIAGNOSIS — I9789 Other postprocedural complications and disorders of the circulatory system, not elsewhere classified: Secondary | ICD-10-CM

## 2020-11-28 DIAGNOSIS — I4891 Unspecified atrial fibrillation: Secondary | ICD-10-CM

## 2020-11-28 DIAGNOSIS — E8881 Metabolic syndrome: Secondary | ICD-10-CM

## 2020-11-28 DIAGNOSIS — I519 Heart disease, unspecified: Secondary | ICD-10-CM

## 2020-11-28 DIAGNOSIS — E119 Type 2 diabetes mellitus without complications: Secondary | ICD-10-CM | POA: Diagnosis not present

## 2020-11-28 NOTE — Assessment & Plan Note (Signed)

## 2020-11-28 NOTE — Assessment & Plan Note (Signed)
Patient takes his cardiac medication regularly he denies any chest pain chest is clear on auscultation

## 2020-11-28 NOTE — Assessment & Plan Note (Signed)
We will continue following.  He denies chest pain or shortness of breath there is no history of dizziness or stroke.  I told him that he needs to keep his diabetes under good control

## 2020-11-28 NOTE — Assessment & Plan Note (Signed)
Patient denies any swelling of the legs does not have any shortness of breath at night.  He has an echo done recently I do not have the report of the echocardiogram yet.

## 2020-11-28 NOTE — Assessment & Plan Note (Signed)

## 2020-11-28 NOTE — Telephone Encounter (Signed)
   Primary Cardiologist: [name], MD  Chart reviewed as part of pre-operative protocol coverage. Given past medical history and time since last visit, based on ACC/AHA guidelines, Ricardo Brown would be at acceptable risk for the planned procedure without further cardiovascular testing.   Patient was advised that if he develops new symptoms prior to surgery to contact our office to arrange a follow-up appointment.  He verbalized understanding.  I will route this recommendation to the requesting party via Epic fax function and remove from pre-op pool.  Please call with questions.  Jossie Ng. Kellianne Ek NP-C    11/28/2020, 8:41 AM [address] Office [phone] Fax [phone]

## 2020-11-28 NOTE — Progress Notes (Signed)
Established Patient Office Visit  Subjective:  Patient ID: Ricardo Brown, male    DOB: 21-Oct-1960  Age: 60 y.o. MRN: [id number]  CC:  Chief Complaint  Patient presents with  . Diabetes    HPI  Ricardo Brown presents for her diabetes.  Patient is already taking his blood pressure is okay.  Denies any chest pain or shortness of breath.  There is no paroxysmal nocturnal dyspnea.  Denies any history of swelling of the legs.  Any problems or Past Medical History:  Diagnosis Date  . CHF (congestive heart failure) ([place])   . Coronary artery disease   . Diabetes mellitus without complication ([place])    a. Dx ~ 2000.  [name] Heart palpitations    a. Pt reports prior nl echo's and stress tests. Last stress test w/in past 2 yrs - PCP.  [name] High cholesterol   . Hypertension   . Myocardial infarction [facility])     Past Surgical History:  Procedure Laterality Date  . CARDIAC CATHETERIZATION    . CARDIAC VALVE REPLACEMENT     Mitral Valve Repair  . COLONOSCOPY WITH PROPOFOL N/A 06/07/2019   Procedure: COLONOSCOPY WITH PROPOFOL;  Surgeon: [name], MD;  Location: [facility] ENDOSCOPY;  Service: Endoscopy;  Laterality: N/A;  . COLONOSCOPY WITH PROPOFOL N/A 02/07/2020   Procedure: COLONOSCOPY WITH PROPOFOL;  Surgeon: [name], MD;  Location: [facility] ENDOSCOPY;  Service: Endoscopy;  Laterality: N/A;  . CORONARY ANGIOPLASTY    . CORONARY ARTERY BYPASS GRAFT     x 3   01/2018  . ESOPHAGOGASTRODUODENOSCOPY N/A 02/07/2020   Procedure: ESOPHAGOGASTRODUODENOSCOPY (EGD);  Surgeon: [name], MD;  Location: [facility] ENDOSCOPY;  Service: Endoscopy;  Laterality: N/A;  . LEFT HEART CATH AND CORONARY ANGIOGRAPHY N/A 01/24/2018   Procedure: LEFT HEART CATH AND CORONARY ANGIOGRAPHY;  Surgeon: [name], MD;  Location: [place] CV LAB;  Service: Cardiovascular;  Laterality: N/A;  . TONSILLECTOMY      Family History  Problem Relation Age of Onset  . Cancer Mother         died @ 88  . CAD Father        First MI @ 10. S/p heart transplant. Died in mid-50's of cancer.  . Cancer Father   . Other Brother        alive and well    Social History   Socioeconomic History  . Marital status: Married    Spouse name: Not on file  . Number of children: Not on file  . Years of education: Not on file  . Highest education level: Not on file  Occupational History  . Not on file  Tobacco Use  . Smoking status: Never Smoker  . Smokeless tobacco: Never Used  Vaping Use  . Vaping Use: Never used  Substance and Sexual Activity  . Alcohol use: Not Currently    Comment: rare beer  . Drug use: No  . Sexual activity: Not on file  Other Topics Concern  . Not on file  Social History Narrative   Lives locally with wife.  Works in [employer].  Does not routinely exercise.   Social Determinants of Health   Financial Resource Strain: Not on file  Food Insecurity: Not on file  Transportation Needs: Not on file  Physical Activity: Not on file  Stress: Not on file  Social Connections: Not on file  Intimate Partner Violence: Not on file     Current Outpatient Medications:  .  amiodarone (PACERONE) 200 MG tablet, Take 1 tablet (200 mg total) by mouth daily., Disp: 90 tablet, Rfl: 2 .  aspirin 81 MG EC tablet, Take 81 mg by mouth daily. , Disp: , Rfl:  .  b complex vitamins tablet, Take 1 tablet by mouth daily. , Disp: , Rfl:  .  Dulaglutide 1.5 MG/0.5ML SOPN, Inject into the skin once a week. , Disp: , Rfl:  .  furosemide (LASIX) 20 MG tablet, Take 2 tablets (40 mg total) by mouth daily., Disp: 180 tablet, Rfl: 2 .  losartan (COZAAR) 100 MG tablet, Take 1 tablet (100 mg total) by mouth daily., Disp: 90 tablet, Rfl: 3 .  metFORMIN (GLUCOPHAGE) 500 MG tablet, TAKE 2 TABLETS BY MOUTH IN THE MORNING AND AT BEDTIME, Disp: 360 tablet, Rfl: 0 .  metoprolol succinate (TOPROL-XL) 50 MG 24 hr tablet, Take 50 mg by mouth daily. Take with or immediately following a meal.,  Disp: , Rfl:  .  Omega-3 Fatty Acids (FISH OIL) 875 MG CAPS, Take 1 capsule by mouth 2 (two) times daily., Disp: , Rfl:  .  Pramipexole Dihydrochloride 1.5 MG TB24, Take 1 tablet (1.5 mg total) by mouth daily., Disp: 30 tablet, Rfl: 6 .  rosuvastatin (CRESTOR) 20 MG tablet, Take 1 tablet (20 mg total) by mouth daily., Disp: 90 tablet, Rfl: 3 .  spironolactone (ALDACTONE) 25 MG tablet, Take 1 tablet (25 mg total) by mouth daily., Disp: 30 tablet, Rfl: 5 .  traZODone (DESYREL) 50 MG tablet, , Disp: , Rfl:    Allergies  Allergen Reactions  . Entresto [Sacubitril-Valsartan] Other (See Comments)    SVT  . Lipitor [Atorvastatin] Palpitations    SVT  . Pregabalin Other (See Comments)    ROS Review of Systems  Constitutional: Negative.   HENT: Negative.   Eyes: Negative.   Respiratory: Negative.   Cardiovascular: Negative.   Gastrointestinal: Negative.   Endocrine: Negative.   Genitourinary: Negative.   Musculoskeletal: Negative.   Skin: Negative.   Allergic/Immunologic: Negative.   Neurological: Negative.   Hematological: Negative.   Psychiatric/Behavioral: Negative.   All other systems reviewed and are negative.     Objective:    Physical Exam Vitals reviewed.  Constitutional:      Appearance: Normal appearance.  HENT:     Mouth/Throat:     Mouth: Mucous membranes are moist.  Eyes:     Pupils: Pupils are equal, round, and reactive to light.  Neck:     Vascular: No carotid bruit.  Cardiovascular:     Rate and Rhythm: Normal rate and regular rhythm.     Pulses: Normal pulses.     Heart sounds: Normal heart sounds.  Pulmonary:     Effort: Pulmonary effort is normal.     Breath sounds: Normal breath sounds.  Abdominal:     General: Bowel sounds are normal.     Palpations: Abdomen is soft. There is no hepatomegaly, splenomegaly or mass.     Tenderness: There is no abdominal tenderness.     Hernia: No hernia is present.  Musculoskeletal:     Cervical back: Neck  supple.     Right lower leg: No edema.     Left lower leg: No edema.  Skin:    Findings: No rash.  Neurological:     Mental Status: He is alert and oriented to person, place, and time.     Motor: No weakness.  Psychiatric:        Mood and Affect: Mood normal.  Behavior: Behavior normal.     BP 133/77   Pulse 77   Ht 5\' 8"  (1.727 m)   Wt 212 lb 14.4 oz (96.6 kg)   BMI 32.37 kg/m  Wt Readings from Last 3 Encounters:  11/28/20 212 lb 14.4 oz (96.6 kg)  11/07/20 214 lb (97.1 kg)  09/30/20 220 lb 4.8 oz (99.9 kg)     Health Maintenance Due  Topic Date Due  . HEMOGLOBIN A1C  Never done  . Hepatitis C Screening  Never done  . PNEUMOCOCCAL POLYSACCHARIDE VACCINE AGE 34-64 HIGH RISK  Never done  . COVID-19 Vaccine (1) Never done  . FOOT EXAM  Never done  . OPHTHALMOLOGY EXAM  Never done  . HIV Screening  Never done  . TETANUS/TDAP  Never done    There are no preventive care reminders to display for this patient.  Lab Results  Component Value Date   TSH 2.53 09/30/2020   Lab Results  Component Value Date   WBC 7.7 09/30/2020   HGB 13.7 09/30/2020   HCT 40.7 09/30/2020   MCV 88.5 09/30/2020   PLT 214 09/30/2020   Lab Results  Component Value Date   NA 137 09/30/2020   K 4.4 09/30/2020   CO2 24 09/30/2020   GLUCOSE 106 (H) 09/30/2020   BUN 23 09/30/2020   CREATININE 1.31 09/30/2020   BILITOT 0.4 09/30/2020   ALKPHOS 29 (L) 01/24/2018   AST 27 09/30/2020   ALT 26 09/30/2020   PROT 6.5 09/30/2020   ALBUMIN 3.8 01/24/2018   CALCIUM 9.0 09/30/2020   ANIONGAP 13 08/23/2020   Lab Results  Component Value Date   CHOL 122 09/30/2020   Lab Results  Component Value Date   HDL 35 (L) 09/30/2020   Lab Results  Component Value Date   LDLCALC 67 09/30/2020   Lab Results  Component Value Date   TRIG 121 09/30/2020   Lab Results  Component Value Date   CHOLHDL 3.5 09/30/2020   No results found for: HGBA1C    Assessment & Plan:   Problem List  Items Addressed This Visit      Cardiovascular and Mediastinum   Hypertension    Patient blood pressure is normal patient denies any chest pain or shortness of breath there is no history of palpitation or paroxysmal nocturnal dyspnea   patient was advised to follow low-salt low-cholesterol diet    ideally I want to keep systolic blood pressure below 130 mmHg, patient was asked to check blood pressure one times a week and give me a report on that.  Patient will be follow-up in 3 months  or earlier as needed, patient will call me back for any change in the cardiovascular symptoms         Right ventricular dysfunction - Primary    Patient denies any swelling of the legs does not have any shortness of breath at night.  He has an echo done recently I do not have the report of the echocardiogram yet.      Postoperative atrial fibrillation (HCC)    We will continue following.  He denies chest pain or shortness of breath there is no history of dizziness or stroke.  I told him that he needs to keep his diabetes under good control        Endocrine   Diabetes mellitus type 2, uncomplicated ([place])    - The patient's blood sugar is under control on med. - The patient will continue the current treatment  regimen.  - I encouraged the patient to regularly check blood sugar.  - I encouraged the patient to monitor diet. I encouraged the patient to eat low-carb and low-sugar to help prevent blood sugar spikes.  - I encouraged the patient to continue following their prescribed treatment plan for diabetes - I informed the patient to get help if blood sugar drops below 54mg /dL, or if suddenly have trouble thinking clearly or breathing.         Other   S/P CABG x 3    Patient takes his cardiac medication regularly he denies any chest pain chest is clear on auscultation      Metabolic syndrome    - I encouraged the patient to lose weight.  - I educated them on making healthy dietary choices including  eating more fruits and vegetables and less fried foods. - I encouraged the patient to exercise more, and educated on the benefits of exercise including weight loss, diabetes prevention, and hypertension prevention.   Dietary counseling with a registered dietician  Referral to a weight management support group (e.g. Weight Watchers, Overeaters Anonymous)  If your BMI is greater than 29 or you have gained more than 15 pounds you should work on weight loss.  Attend a healthy cooking class        Patient is seeing the eye doctor on a regular basis and receive shots in the eyes.  He does not want to take COVID shot or Pneumonia shot.  His foot examination reveals that his nails to be trimmed by his wife.    I told him that he should see a foot doctor, and he will decide on that.  Foot examination does not reveal any ulcers.  No orders of the defined types were placed in this encounter.   Follow-up: No follow-ups on file.    [name], MD

## 2020-11-28 NOTE — Assessment & Plan Note (Signed)
Patient blood pressure is normal patient denies any chest pain or shortness of breath there is no history of palpitation or paroxysmal nocturnal dyspnea   patient was advised to follow low-salt low-cholesterol diet    ideally I want to keep systolic blood pressure below 130 mmHg, patient was asked to check blood pressure one times a week and give me a report on that.  Patient will be follow-up in 3 months  or earlier as needed, patient will call me back for any change in the cardiovascular symptoms    

## 2020-11-28 NOTE — Telephone Encounter (Signed)
Patient was returning call 

## 2020-12-02 ENCOUNTER — Other Ambulatory Visit: Payer: Self-pay | Admitting: Internal Medicine

## 2020-12-10 ENCOUNTER — Encounter: Payer: Self-pay | Admitting: Gastroenterology

## 2020-12-11 ENCOUNTER — Other Ambulatory Visit: Payer: Self-pay

## 2020-12-11 ENCOUNTER — Ambulatory Visit: Payer: 59 | Admitting: Registered Nurse

## 2020-12-11 ENCOUNTER — Encounter: Payer: Self-pay | Admitting: Gastroenterology

## 2020-12-11 ENCOUNTER — Ambulatory Visit
Admission: RE | Admit: 2020-12-11 | Discharge: 2020-12-11 | Disposition: A | Discharge status: Home / Self Care | Payer: 59 | Attending: Gastroenterology | Admitting: Gastroenterology

## 2020-12-11 ENCOUNTER — Encounter
Admission: RE | Disposition: A | Discharge status: Home / Self Care | Payer: Self-pay | Source: Home / Self Care | Attending: Gastroenterology

## 2020-12-11 DIAGNOSIS — Z79899 Other long term (current) drug therapy: Secondary | ICD-10-CM | POA: Diagnosis not present

## 2020-12-11 DIAGNOSIS — D1779 Benign lipomatous neoplasm of other sites: Secondary | ICD-10-CM | POA: Diagnosis not present

## 2020-12-11 DIAGNOSIS — Z7984 Long term (current) use of oral hypoglycemic drugs: Secondary | ICD-10-CM | POA: Diagnosis not present

## 2020-12-11 DIAGNOSIS — D175 Benign lipomatous neoplasm of intra-abdominal organs: Secondary | ICD-10-CM

## 2020-12-11 DIAGNOSIS — Z952 Presence of prosthetic heart valve: Secondary | ICD-10-CM | POA: Insufficient documentation

## 2020-12-11 DIAGNOSIS — Z1211 Encounter for screening for malignant neoplasm of colon: Secondary | ICD-10-CM | POA: Diagnosis not present

## 2020-12-11 DIAGNOSIS — Z8249 Family history of ischemic heart disease and other diseases of the circulatory system: Secondary | ICD-10-CM | POA: Diagnosis not present

## 2020-12-11 DIAGNOSIS — Z951 Presence of aortocoronary bypass graft: Secondary | ICD-10-CM | POA: Insufficient documentation

## 2020-12-11 DIAGNOSIS — Z7982 Long term (current) use of aspirin: Secondary | ICD-10-CM | POA: Diagnosis not present

## 2020-12-11 DIAGNOSIS — D124 Benign neoplasm of descending colon: Secondary | ICD-10-CM | POA: Insufficient documentation

## 2020-12-11 DIAGNOSIS — Z888 Allergy status to other drugs, medicaments and biological substances status: Secondary | ICD-10-CM | POA: Insufficient documentation

## 2020-12-11 DIAGNOSIS — Z86010 Personal history of colonic polyps: Secondary | ICD-10-CM | POA: Insufficient documentation

## 2020-12-11 HISTORY — PX: COLONOSCOPY WITH PROPOFOL: SHX5780

## 2020-12-11 LAB — GLUCOSE, CAPILLARY: Glucose-Capillary: 128 mg/dL — ABNORMAL HIGH (ref 70–99)

## 2020-12-11 SURGERY — COLONOSCOPY WITH PROPOFOL
Anesthesia: General

## 2020-12-11 MED ORDER — PROPOFOL 10 MG/ML IV BOLUS
INTRAVENOUS | Status: DC | PRN
  Administered 2020-12-11: 100 mg via INTRAVENOUS

## 2020-12-11 MED ORDER — LIDOCAINE HCL (CARDIAC) PF 100 MG/5ML IV SOSY
PREFILLED_SYRINGE | INTRAVENOUS | Status: DC | PRN
  Administered 2020-12-11: 80 mg via INTRAVENOUS

## 2020-12-11 MED ORDER — SODIUM CHLORIDE 0.9 % IV SOLN
INTRAVENOUS | Status: DC
  Administered 2020-12-11: 20 mL/h via INTRAVENOUS

## 2020-12-11 MED ORDER — PROPOFOL 500 MG/50ML IV EMUL
INTRAVENOUS | Status: DC | PRN
  Administered 2020-12-11: 150 ug/kg/min via INTRAVENOUS

## 2020-12-11 MED ORDER — PHENYLEPHRINE HCL (PRESSORS) 10 MG/ML IV SOLN
INTRAVENOUS | Status: DC | PRN
  Administered 2020-12-11: 100 ug via INTRAVENOUS
  Administered 2020-12-11: 150 ug via INTRAVENOUS
  Administered 2020-12-11: 200 ug via INTRAVENOUS
  Administered 2020-12-11 (×2): 100 ug via INTRAVENOUS
  Administered 2020-12-11: 50 ug via INTRAVENOUS

## 2020-12-11 MED ORDER — EPHEDRINE SULFATE 50 MG/ML IJ SOLN
INTRAMUSCULAR | Status: DC | PRN
  Administered 2020-12-11: 10 mg via INTRAVENOUS

## 2020-12-11 NOTE — Anesthesia Preprocedure Evaluation (Signed)
Anesthesia Evaluation  Patient identified by MRN, date of birth, ID band Patient awake    Reviewed: Allergy & Precautions, NPO status , Patient's Chart, lab work & pertinent test results, reviewed documented beta blocker date and time   Airway Mallampati: II  TM Distance: >3 FB Neck ROM: Full    Dental  (+) Upper Dentures, Lower Dentures   Pulmonary neg pulmonary ROS,    Pulmonary exam normal        Cardiovascular hypertension, Pt. on medications and Pt. on home beta blockers + CAD, + Past MI and +CHF  Normal cardiovascular exam     Neuro/Psych negative neurological ROS  negative psych ROS   GI/Hepatic negative GI ROS, Neg liver ROS,   Endo/Other  diabetes, Well Controlled, Type 2, Oral Hypoglycemic Agents  Renal/GU negative Renal ROS  negative genitourinary   Musculoskeletal negative musculoskeletal ROS (+)   Abdominal Normal abdominal exam  (+)   Peds negative pediatric ROS (+)  Hematology negative hematology ROS (+)   Anesthesia Other Findings CAD, CABG Mitral Valve Repair 2019 Diabetes mellitus without complication ([place])     Comment:  a. Dx ~ 2000. Heart palpitations High cholesterol  EF 35-40%  Reproductive/Obstetrics                             Anesthesia Physical  Anesthesia Plan  ASA: III  Anesthesia Plan: General   Post-op Pain Management:    Induction: Intravenous  PONV Risk Score and Plan: TIVA and Propofol infusion  Airway Management Planned: Nasal Cannula and Natural Airway  Additional Equipment:   Intra-op Plan:   Post-operative Plan:   Informed Consent: I have reviewed the patients History and Physical, chart, labs and discussed the procedure including the risks, benefits and alternatives for the proposed anesthesia with the patient or authorized representative who has indicated his/her understanding and acceptance.     Dental advisory given  Plan  Discussed with: CRNA, Surgeon and Anesthesiologist  Anesthesia Plan Comments:         Anesthesia Quick Evaluation

## 2020-12-11 NOTE — H&P (Signed)
[name], MD [address], [place], [place], [place], [address] [address], [place], [place], [place], [address] Phone: [phone]  Fax: [phone]  Primary Care Physician:  [name], MD   Pre-Procedure History & Physical: HPI:  Ricardo Brown is a 59 y.o. male is here for a colonoscopy.   Past Medical History:  Diagnosis Date  . CHF (congestive heart failure) ([place])   . Coronary artery disease   . Diabetes mellitus without complication ([place])    a. Dx ~ 2000.  [name] Heart palpitations    a. Pt reports prior nl echo's and stress tests. Last stress test w/in past 2 yrs - PCP.  [name] High cholesterol   . Hypertension   . Myocardial infarction [facility])     Past Surgical History:  Procedure Laterality Date  . CARDIAC CATHETERIZATION    . CARDIAC VALVE REPLACEMENT     Mitral Valve Repair  . COLONOSCOPY WITH PROPOFOL N/A 06/07/2019   Procedure: COLONOSCOPY WITH PROPOFOL;  Surgeon: [name], MD;  Location: [facility] ENDOSCOPY;  Service: Endoscopy;  Laterality: N/A;  . COLONOSCOPY WITH PROPOFOL N/A 02/07/2020   Procedure: COLONOSCOPY WITH PROPOFOL;  Surgeon: [name], MD;  Location: [facility] ENDOSCOPY;  Service: Endoscopy;  Laterality: N/A;  . CORONARY ANGIOPLASTY    . CORONARY ARTERY BYPASS GRAFT     x 3   01/2018  . ESOPHAGOGASTRODUODENOSCOPY N/A 02/07/2020   Procedure: ESOPHAGOGASTRODUODENOSCOPY (EGD);  Surgeon: [name], MD;  Location[facility] ENDOSCOPY;  Service: Endoscopy;  Laterality: N/A;  . LEFT HEART CATH AND CORONARY ANGIOGRAPHY N/A 01/24/2018   Procedure: LEFT HEART CATH AND CORONARY ANGIOGRAPHY;  Surgeon: [name], MD;  Location: [place] CV LAB;  Service: Cardiovascular;  Laterality: N/A;  . TONSILLECTOMY      Prior to Admission medications   Medication Sig Start Date End Date Taking? Authorizing Provider  amiodarone (PACERONE) 200 MG tablet Take 1 tablet by mouth once daily 12/03/20  Yes Masoud, Viann Shove, MD  aspirin  81 MG EC tablet Take 81 mg by mouth daily.    Yes [provider]  b complex vitamins tablet Take 1 tablet by mouth daily.    Yes [provider]  Dulaglutide 1.5 MG/0.5ML SOPN Inject into the skin once a week.  03/30/18  Yes [provider]  furosemide (LASIX) 20 MG tablet Take 2 tablets (40 mg total) by mouth daily. 11/04/20  Yes Masoud, Viann Shove, MD  metFORMIN (GLUCOPHAGE) 500 MG tablet TAKE 2 TABLETS BY MOUTH IN THE MORNING AND AT BEDTIME 10/24/20  Yes Masoud, Viann Shove, MD  metoprolol succinate (TOPROL-XL) 50 MG 24 hr tablet Take 50 mg by mouth daily. Take with or immediately following a meal.   Yes [provider]  Omega-3 Fatty Acids (FISH OIL) 875 MG CAPS Take 1 capsule by mouth 2 (two) times daily.   Yes [provider]  Pramipexole Dihydrochloride 1.5 MG TB24 Take 1 tablet (1.5 mg total) by mouth daily. 05/17/20  Yes Masoud, Viann Shove, MD  spironolactone (ALDACTONE) 25 MG tablet Take 1 tablet (25 mg total) by mouth daily. 08/06/20  Yes [name], MD  traZODone (DESYREL) 50 MG tablet  10/01/19  Yes [provider]  losartan (COZAAR) 100 MG tablet Take 1 tablet (100 mg total) by mouth daily. 09/02/20 12/01/20  [name], MD  rosuvastatin (CRESTOR) 20 MG tablet Take 1 tablet (20 mg total) by mouth daily. 09/02/20 12/01/20  [name], MD    Allergies as of 11/27/2020 -  Review Complete 11/27/2020  Allergen Reaction Noted  . Entresto [sacubitril-valsartan] Other (See Comments) 06/07/2019  . Lipitor [atorvastatin] Palpitations 04/23/2015  . Pregabalin Other (See Comments) 10/02/2019    Family History  Problem Relation Age of Onset  . Cancer Mother        died @ 52  . CAD Father        First MI @ 67. S/p heart transplant. Died in mid-50's of cancer.  . Cancer Father   . Other Brother        alive and well    Social History   Socioeconomic History  . Marital status: Married    Spouse name: Not on file  . Number of  children: Not on file  . Years of education: Not on file  . Highest education level: Not on file  Occupational History  . Not on file  Tobacco Use  . Smoking status: Never Smoker  . Smokeless tobacco: Never Used  Vaping Use  . Vaping Use: Never used  Substance and Sexual Activity  . Alcohol use: Not Currently    Comment: rare beer  . Drug use: No  . Sexual activity: Not on file  Other Topics Concern  . Not on file  Social History Narrative   Lives locally with wife.  Works in [employer].  Does not routinely exercise.   Social Determinants of Health   Financial Resource Strain: Not on file  Food Insecurity: Not on file  Transportation Needs: Not on file  Physical Activity: Not on file  Stress: Not on file  Social Connections: Not on file  Intimate Partner Violence: Not on file    Review of Systems: See HPI, otherwise negative ROS  Physical Exam: BP 133/72   Pulse 77   Temp (!) 96.9 F (36.1 C) (Temporal)   Resp 20   Ht 5\' 8"  (1.727 m)   Wt 96.6 kg   SpO2 100%   BMI 32.39 kg/m  General:   Alert,  pleasant and cooperative in NAD Head:  Normocephalic and atraumatic. Neck:  Supple; no masses or thyromegaly. Lungs:  Clear throughout to auscultation, normal respiratory effort.    Heart:  +S1, +S2, Regular rate and rhythm, No edema. Abdomen:  Soft, nontender and nondistended. Normal bowel sounds, without guarding, and without rebound.   Neurologic:  Alert and  oriented x4;  grossly normal neurologically.  Impression/Plan: Ricardo Brown is here for a colonoscopy to be performed for history of adenomatous polyps.  Risks, benefits, limitations, and alternatives regarding  colonoscopy have been reviewed with the patient.  Questions have been answered.  All parties agreeable.   [name], MD  12/11/2020, 9:25 AM

## 2020-12-11 NOTE — Anesthesia Postprocedure Evaluation (Signed)
Anesthesia Post Note  Patient: [name]  Procedure(s) Performed: COLONOSCOPY WITH PROPOFOL (N/A )  Patient location during evaluation: Phase II Anesthesia Type: General Level of consciousness: awake and alert, awake and oriented Pain management: pain level controlled Vital Signs Assessment: post-procedure vital signs reviewed and stable Respiratory status: spontaneous breathing, nonlabored ventilation and respiratory function stable Cardiovascular status: blood pressure returned to baseline and stable Postop Assessment: no apparent nausea or vomiting Anesthetic complications: no   No complications documented.   Last Vitals:  Vitals:   12/11/20 1029 12/11/20 1039  BP: (!) 111/58 (!) 142/66  Pulse: 71 69  Resp: 19 11  Temp:    SpO2: 100% 100%    Last Pain:  Vitals:   12/11/20 1039  TempSrc:   PainSc: 0-No pain                 Phill Mutter

## 2020-12-11 NOTE — Op Note (Signed)
[facility] Gastroenterology Patient Name: Ricardo Brown Procedure Date: 12/11/2020 9:22 AM MRN: [id number] Account #: [id number] Date of Birth: 03/28/1961 Admit Type: Outpatient Age: 60 Room: [facility] ENDO ROOM 3 Gender: Male Note Status: Finalized Procedure:             Colonoscopy Indications:           Screening for colorectal malignant neoplasm Providers:             Harika Laidlaw B. Bonna Gains MD, MD Referring MD:          [name], MD (Referring MD) Medicines:             Monitored Anesthesia Care Complications:         No immediate complications. Procedure:             Pre-Anesthesia Assessment:                        - ASA Grade Assessment: II - A patient with mild                         systemic disease.                        - Prior to the procedure, a History and Physical was                         performed, and patient medications, allergies and                         sensitivities were reviewed. The patient's tolerance                         of previous anesthesia was reviewed.                        - The risks and benefits of the procedure and the                         sedation options and risks were discussed with the                         patient. All questions were answered and informed                         consent was obtained.                        - Patient identification and proposed procedure were                         verified prior to the procedure by the physician, the                         nurse, the anesthesiologist, the anesthetist and the                         technician. The procedure was verified in the                         procedure room.  After obtaining informed consent, the colonoscope was                         passed under direct vision. Throughout the procedure,                         the patient's blood pressure, pulse, and oxygen                         saturations were  monitored continuously. The                         Colonoscope was introduced through the anus and                         advanced to the the cecum, identified by appendiceal                         orifice and ileocecal valve. The colonoscopy was                         performed with ease. The patient tolerated the                         procedure well. The quality of the bowel preparation                         was fair, but was cleaned well with suction and water                         and changed to a good prep. Findings:      The perianal and digital rectal examinations were normal.      There was a small lipoma, at the hepatic flexure. Biopsies were taken       with a cold forceps for histology.      A tattoo was seen at the hepatic flexure. The tattoo site appeared       normal.      Five sessile polyps were found in the descending colon. The polyps were       4 to 6 mm in size. These polyps were removed with a cold snare.       Resection and retrieval were complete.      The exam was otherwise without abnormality.      The rectum, sigmoid colon, descending colon, transverse colon, ascending       colon and cecum appeared normal.      The retroflexed view of the distal rectum and anal verge was normal and       showed no anal or rectal abnormalities. Impression:            - Small lipoma at the hepatic flexure. Biopsied.                        - A tattoo was seen at the hepatic flexure. The tattoo                         site appeared normal.                        -  Five 4 to 6 mm polyps in the descending colon,                         removed with a cold snare. Resected and retrieved.                        - The examination was otherwise normal.                        - The rectum, sigmoid colon, descending colon,                         transverse colon, ascending colon and cecum are normal.                        - The distal rectum and anal verge are normal on                          retroflexion view. Recommendation:        - Discharge patient to home (with escort).                        - Advance diet as tolerated.                        - Continue present medications.                        - Await pathology results.                        - Repeat colonoscopy in 2 years, with 2 day prep.                        - The findings and recommendations were discussed with                         the patient.                        - The findings and recommendations were discussed with                         the patient's family.                        - Return to primary care physician as previously                         scheduled. Procedure Code(s):     --- Professional ---                        [phone], Colonoscopy, flexible; with removal of                         tumor(s), polyp(s), or other lesion(s) by snare                         technique  66063, 59, Colonoscopy, flexible; with biopsy, single                         or multiple Diagnosis Code(s):     --- Professional ---                        Z12.11, Encounter for screening for malignant neoplasm                         of colon                        D17.5, Benign lipomatous neoplasm of intra-abdominal                         organs                        K63.5, Polyp of colon CPT copyright 2019 American Medical Association. All rights reserved. The codes documented in this report are preliminary and upon coder review may  be revised to meet current compliance requirements.  [name], MD Margretta Sidle B. Bonna Gains MD, MD 12/11/2020 10:23:00 AM This report has been signed electronically. Number of Addenda: 0 Note Initiated On: 12/11/2020 9:22 AM Scope Withdrawal Time: 0 hours 29 minutes 43 seconds  Total Procedure Duration: 0 hours 37 minutes 44 seconds  Estimated Blood Loss:  Estimated blood loss: none.      [facility]

## 2020-12-11 NOTE — Transfer of Care (Signed)
Immediate Anesthesia Transfer of Care Note  Patient: Ricardo Brown  Procedure(s) Performed: COLONOSCOPY WITH PROPOFOL (N/A )  Patient Location: Endoscopy Unit  Anesthesia Type:General  Level of Consciousness: drowsy  Airway & Oxygen Therapy: Patient Spontanous Breathing  Post-op Assessment: Report given to RN and Post -op Vital signs reviewed and stable  Post vital signs: Reviewed and stable  Last Vitals:  Vitals Value Taken Time  BP 82/48 12/11/20 1019  Temp    Pulse 65 12/11/20 1020  Resp 11 12/11/20 1020  SpO2 100 % 12/11/20 1020  Vitals shown include unvalidated device data.  Last Pain:  Vitals:   12/11/20 0910  TempSrc: Temporal  PainSc: 0-No pain         Complications: No complications documented.   Hypotension treated with ephedrine.

## 2020-12-12 LAB — SURGICAL PATHOLOGY

## 2020-12-16 ENCOUNTER — Encounter: Payer: Self-pay | Admitting: Gastroenterology

## 2021-01-14 ENCOUNTER — Other Ambulatory Visit: Payer: Self-pay | Admitting: Internal Medicine

## 2021-01-26 ENCOUNTER — Other Ambulatory Visit: Payer: Self-pay | Admitting: Internal Medicine

## 2021-02-12 ENCOUNTER — Other Ambulatory Visit: Payer: Self-pay | Admitting: *Deleted

## 2021-02-12 MED ORDER — PRAMIPEXOLE DIHYDROCHLORIDE 1.5 MG PO TABS: 1.5000 mg | ORAL_TABLET | Freq: Every day | ORAL | 3 refills | Status: DC

## 2021-02-25 ENCOUNTER — Ambulatory Visit: Payer: 59 | Admitting: Internal Medicine

## 2021-03-09 ENCOUNTER — Other Ambulatory Visit: Payer: Self-pay | Admitting: Internal Medicine

## 2021-04-20 ENCOUNTER — Other Ambulatory Visit: Payer: Self-pay | Admitting: Internal Medicine

## 2021-04-21 ENCOUNTER — Encounter: Payer: Self-pay | Admitting: Internal Medicine

## 2021-04-21 ENCOUNTER — Other Ambulatory Visit: Payer: Self-pay

## 2021-04-21 ENCOUNTER — Ambulatory Visit (INDEPENDENT_AMBULATORY_CARE_PROVIDER_SITE_OTHER): Payer: 59 | Admitting: Internal Medicine

## 2021-04-21 VITALS — BP 142/77 | HR 75 | Ht 68.0 in | Wt 212.0 lb

## 2021-04-21 DIAGNOSIS — E7800 Pure hypercholesterolemia, unspecified: Secondary | ICD-10-CM

## 2021-04-21 DIAGNOSIS — I471 Supraventricular tachycardia: Secondary | ICD-10-CM | POA: Diagnosis not present

## 2021-04-21 DIAGNOSIS — I10 Essential (primary) hypertension: Secondary | ICD-10-CM

## 2021-04-21 DIAGNOSIS — I25810 Atherosclerosis of coronary artery bypass graft(s) without angina pectoris: Secondary | ICD-10-CM

## 2021-04-21 DIAGNOSIS — E8881 Metabolic syndrome: Secondary | ICD-10-CM

## 2021-04-21 DIAGNOSIS — E119 Type 2 diabetes mellitus without complications: Secondary | ICD-10-CM | POA: Diagnosis not present

## 2021-04-21 DIAGNOSIS — Z951 Presence of aortocoronary bypass graft: Secondary | ICD-10-CM

## 2021-04-21 LAB — GLUCOSE, POCT (MANUAL RESULT ENTRY): POC Glucose: 161 mg/dl — AB (ref 70–99)

## 2021-04-21 NOTE — Assessment & Plan Note (Signed)

## 2021-04-21 NOTE — Assessment & Plan Note (Addendum)
Patient is known to have coronary artery disease and has a history of open heart surgery.  He denies any history of chest pain at rest.  Patient complain of shortness of breath on exertion.  He occasionally have tachycardia which is controlled with amiodarone.  Patient left ventricular ejection fraction is between 40 to 45%

## 2021-04-21 NOTE — Assessment & Plan Note (Signed)

## 2021-04-21 NOTE — Progress Notes (Signed)
Established Patient Office Visit  Subjective:  Patient ID: Ricardo Brown, male    DOB: 1961/01/24  Age: 60 y.o. MRN: [id number]  CC:  Chief Complaint  Patient presents with   Follow-up    Patient states that he has itching of left arm with no rash, x 1-2 weeks. Patient would also like to discuss letter for disability claim.     HPI  Ricardo Brown presents for general check  Past Medical History:  Diagnosis Date   CHF (congestive heart failure) ([place])    Coronary artery disease    Diabetes mellitus without complication ([place])    a. Dx ~ 2000.   Heart palpitations    a. Pt reports prior nl echo's and stress tests. Last stress test w/in past 2 yrs - PCP.   High cholesterol    Hypertension    Myocardial infarction [facility])     Past Surgical History:  Procedure Laterality Date   CARDIAC CATHETERIZATION     CARDIAC VALVE REPLACEMENT     Mitral Valve Repair   COLONOSCOPY WITH PROPOFOL N/A 06/07/2019   Procedure: COLONOSCOPY WITH PROPOFOL;  Surgeon: [name], MD;  Location: [facility] ENDOSCOPY;  Service: Endoscopy;  Laterality: N/A;   COLONOSCOPY WITH PROPOFOL N/A 02/07/2020   Procedure: COLONOSCOPY WITH PROPOFOL;  Surgeon: [name], MD;  Location: [facility] ENDOSCOPY;  Service: Endoscopy;  Laterality: N/A;   COLONOSCOPY WITH PROPOFOL N/A 12/11/2020   Procedure: COLONOSCOPY WITH PROPOFOL;  Surgeon: [name], MD;  Location: [facility] ENDOSCOPY;  Service: Endoscopy;  Laterality: N/A;   CORONARY ANGIOPLASTY     CORONARY ARTERY BYPASS GRAFT     x 3   01/2018   ESOPHAGOGASTRODUODENOSCOPY N/A 02/07/2020   Procedure: ESOPHAGOGASTRODUODENOSCOPY (EGD);  Surgeon: [name], MD;  Location: [facility] ENDOSCOPY;  Service: Endoscopy;  Laterality: N/A;   LEFT HEART CATH AND CORONARY ANGIOGRAPHY N/A 01/24/2018   Procedure: LEFT HEART CATH AND CORONARY ANGIOGRAPHY;  Surgeon: [name], MD;  Location: [facility] CV LAB;  Service: Cardiovascular;   Laterality: N/A;   TONSILLECTOMY      Family History  Problem Relation Age of Onset   Cancer Mother        died @ 68   CAD Father        First MI @ 37. S/p heart transplant. Died in mid-50's of cancer.   Cancer Father    Other Brother        alive and well    Social History   Socioeconomic History   Marital status: Married    Spouse name: Not on file   Number of children: Not on file   Years of education: Not on file   Highest education level: Not on file  Occupational History   Not on file  Tobacco Use   Smoking status: Never   Smokeless tobacco: Never  Vaping Use   Vaping Use: Never used  Substance and Sexual Activity   Alcohol use: Not Currently    Comment: rare beer   Drug use: No   Sexual activity: Not on file  Other Topics Concern   Not on file  Social History Narrative   Lives locally with wife.  Works in [employer].  Does not routinely exercise.   Social Determinants of Health   Financial Resource Strain: Not on file  Food Insecurity: Not on file  Transportation Needs: Not on file  Physical Activity: Not on file  Stress: Not on file  Social  Connections: Not on file  Intimate Partner Violence: Not on file     Current Outpatient Medications:    amiodarone (PACERONE) 200 MG tablet, Take 1 tablet by mouth once daily, Disp: 90 tablet, Rfl: 0   aspirin 81 MG EC tablet, Take 81 mg by mouth daily. , Disp: , Rfl:    b complex vitamins tablet, Take 1 tablet by mouth daily. , Disp: , Rfl:    Dulaglutide 1.5 MG/0.5ML SOPN, Inject into the skin once a week. , Disp: , Rfl:    furosemide (LASIX) 20 MG tablet, Take 2 tablets (40 mg total) by mouth daily., Disp: 180 tablet, Rfl: 2   losartan (COZAAR) 100 MG tablet, Take 1 tablet (100 mg total) by mouth daily., Disp: 90 tablet, Rfl: 3   metFORMIN (GLUCOPHAGE) 500 MG tablet, TAKE 2 TABLETS BY MOUTH IN THE MORNING AND 2 AT BEDTIME, Disp: 360 tablet, Rfl: 0   metoprolol succinate (TOPROL-XL) 50 MG 24 hr tablet, Take  50 mg by mouth daily. Take with or immediately following a meal., Disp: , Rfl:    Omega-3 Fatty Acids (FISH OIL) 875 MG CAPS, Take 1 capsule by mouth 2 (two) times daily., Disp: , Rfl:    pramipexole (MIRAPEX) 1.5 MG tablet, Take 1 tablet (1.5 mg total) by mouth daily., Disp: 30 tablet, Rfl: 3   Pramipexole Dihydrochloride 1.5 MG TB24, Take 1 tablet (1.5 mg total) by mouth daily., Disp: 30 tablet, Rfl: 6   rosuvastatin (CRESTOR) 20 MG tablet, Take 1 tablet (20 mg total) by mouth daily., Disp: 90 tablet, Rfl: 3   spironolactone (ALDACTONE) 25 MG tablet, Take 1 tablet (25 mg total) by mouth daily., Disp: 30 tablet, Rfl: 5   traZODone (DESYREL) 50 MG tablet, , Disp: , Rfl:    Allergies  Allergen Reactions   Entresto [Sacubitril-Valsartan] Other (See Comments)    SVT   Lipitor [Atorvastatin] Palpitations    SVT   Pregabalin Other (See Comments)    ROS Review of Systems  Constitutional: Negative.   HENT: Negative.    Eyes: Negative.   Respiratory:  Positive for shortness of breath. Negative for wheezing.   Cardiovascular:  Positive for palpitations and leg swelling. Negative for chest pain.  Gastrointestinal:  Positive for abdominal distention, diarrhea, nausea and vomiting. Negative for abdominal pain, blood in stool and constipation.       Colonoscopy was okay  Endocrine: Negative.   Genitourinary: Negative.  Negative for dysuria.  Musculoskeletal: Negative.   Skin: Negative.   Allergic/Immunologic: Negative.   Neurological: Negative.   Hematological: Negative.   Psychiatric/Behavioral: Negative.    All other systems reviewed and are negative.    Objective:    Physical Exam Vitals reviewed.  Constitutional:      Appearance: Normal appearance.  HENT:     Mouth/Throat:     Mouth: Mucous membranes are moist.  Eyes:     Pupils: Pupils are equal, round, and reactive to light.  Neck:     Vascular: No carotid bruit.  Cardiovascular:     Rate and Rhythm: Normal rate and  regular rhythm.     Pulses: Normal pulses.     Heart sounds: Normal heart sounds.  Pulmonary:     Effort: Pulmonary effort is normal.     Breath sounds: Normal breath sounds.  Abdominal:     General: Bowel sounds are normal.     Palpations: Abdomen is soft. There is no hepatomegaly, splenomegaly or mass.     Tenderness: There is  no abdominal tenderness.     Hernia: No hernia is present.  Musculoskeletal:     Cervical back: Neck supple.     Right lower leg: No edema.     Left lower leg: No edema.  Skin:    Findings: No rash.  Neurological:     Mental Status: He is alert and oriented to person, place, and time.     Motor: No weakness.  Psychiatric:        Mood and Affect: Mood normal.        Behavior: Behavior normal.  Patient has a positive Romberg test.  He cannot stand on the one leg.  He cannot kneel or crawl.  He cannot do repetitive movement of both hands or both leg for more than 15 minutes.  Patient has trouble taking trash out of the house because of shortness of breath.  He is also unsteady on the gait so he is on the risk of falling.  Patient has a stiffness of the leg on movement.   BP (!) 142/77   Pulse 75   Ht 5\' 8"  (1.727 m)   Wt 212 lb (96.2 kg)   BMI 32.23 kg/m  Wt Readings from Last 3 Encounters:  04/21/21 212 lb (96.2 kg)  12/11/20 213 lb (96.6 kg)  11/28/20 212 lb 14.4 oz (96.6 kg)     Health Maintenance Due  Topic Date Due   HEMOGLOBIN A1C  Never done   COVID-19 Vaccine (1) Never done   FOOT EXAM  Never done   OPHTHALMOLOGY EXAM  Never done   URINE MICROALBUMIN  Never done   HIV Screening  Never done   Hepatitis C Screening  Never done   TETANUS/TDAP  Never done   Zoster Vaccines- Shingrix (1 of 2) Never done   INFLUENZA VACCINE  Never done    There are no preventive care reminders to display for this patient.  Lab Results  Component Value Date   TSH 2.53 09/30/2020   Lab Results  Component Value Date   WBC 7.7 09/30/2020   HGB 13.7  09/30/2020   HCT 40.7 09/30/2020   MCV 88.5 09/30/2020   PLT 214 09/30/2020   Lab Results  Component Value Date   NA 137 09/30/2020   K 4.4 09/30/2020   CO2 24 09/30/2020   GLUCOSE 106 (H) 09/30/2020   BUN 23 09/30/2020   CREATININE 1.31 09/30/2020   BILITOT 0.4 09/30/2020   ALKPHOS 29 (L) 01/24/2018   AST 27 09/30/2020   ALT 26 09/30/2020   PROT 6.5 09/30/2020   ALBUMIN 3.8 01/24/2018   CALCIUM 9.0 09/30/2020   ANIONGAP 13 08/23/2020   Lab Results  Component Value Date   CHOL 122 09/30/2020   Lab Results  Component Value Date   HDL 35 (L) 09/30/2020   Lab Results  Component Value Date   LDLCALC 67 09/30/2020   Lab Results  Component Value Date   TRIG 121 09/30/2020   Lab Results  Component Value Date   CHOLHDL 3.5 09/30/2020   No results found for: HGBA1C    Assessment & Plan:   Problem List Items Addressed This Visit       Cardiovascular and Mediastinum   Hypertension     Patient denies any chest pain or shortness of breath there is no history of palpitation or paroxysmal nocturnal dyspnea   patient was advised to follow low-salt low-cholesterol diet    ideally I want to keep systolic blood pressure below 130  mmHg, patient was asked to check blood pressure one times a week and give me a report on that.  Patient will be follow-up in 3 months  or earlier as needed, patient will call me back for any change in the cardiovascular symptoms Patient was advised to buy a book from local bookstore concerning blood pressure and read several chapters  every day.  This will be supplemented by some of the material we will give him from the office.  Patient should also utilize other resources like YouTube and Internet to learn more about the blood pressure and the diet.      PSVT (paroxysmal supraventricular tachycardia) (HCC)    Well-controlled on amiodarone      Coronary artery disease    Patient is known to have coronary artery disease and has a history of open  heart surgery.  He denies any history of chest pain at rest.  Patient complain of shortness of breath on exertion.  He occasionally have tachycardia which is controlled with amiodarone.  Patient left ventricular ejection fraction is between 40 to 45%        Endocrine   Diabetes mellitus type 2, uncomplicated ([place]) - Primary    - The patient's blood sugar is labile on med. - The patient will continue the current treatment regimen.  - I encouraged the patient to regularly check blood sugar.  - I encouraged the patient to monitor diet. I encouraged the patient to eat low-carb and low-sugar to help prevent blood sugar spikes.  - I encouraged the patient to continue following their prescribed treatment plan for diabetes - I informed the patient to get help if blood sugar drops below 54mg /dL, or if suddenly have trouble thinking clearly or breathing.  Patient was advised to buy a book on diabetes from a local bookstore or from [place]  Patient should read 2 chapters every day to keep the motivation going, this is in addition to some of the materials we provided them from the office.  There are other resources on the Internet like YouTube and wilkipedia to get an education on the diabetes      Relevant Orders   POCT glucose (manual entry) (Completed)     Other   HLD (hyperlipidemia)    Hypercholesterolemia  I advised the patient to follow Mediterranean diet This diet is rich in fruits vegetables and whole grain, and This diet is also rich in fish and lean meat Patient should also eat a handful of almonds or walnuts daily Recent heart study indicated that average follow-up on this kind of diet reduces the cardiovascular mortality by 50 to70% patient is on statin      S/P CABG x 3    Denies any chest pain and angina is stable      Metabolic syndrome    - I encouraged the patient to lose weight.  - I educated them on making healthy dietary choices including eating more fruits and vegetables  and less fried foods. - I encouraged the patient to exercise more, and educated on the benefits of exercise including weight loss, diabetes prevention, and hypertension prevention.   Dietary counseling with a registered dietician  Referral to a weight management support group (e.g. Weight Watchers, Overeaters Anonymous)  If your BMI is greater than 29 or you have gained more than 15 pounds you should work on weight loss.  Attend a healthy cooking class      Patient has a diabetic retinopathy, and he sees eye doctor on  a regular basis to get shots in the retina  No orders of the defined types were placed in this encounter.  Patient should be considered totally and permanently disabled for any gainful employment, he has diabetic retinopathy ,abnormal gait and cannot work on the 8 hours  job, he is seeking disability      [name], MD

## 2021-04-21 NOTE — Assessment & Plan Note (Signed)

## 2021-04-21 NOTE — Assessment & Plan Note (Signed)
Hypercholesterolemia  I advised the patient to follow Mediterranean diet This diet is rich in fruits vegetables and whole grain, and This diet is also rich in fish and lean meat Patient should also eat a handful of almonds or walnuts daily Recent heart study indicated that average follow-up on this kind of diet reduces the cardiovascular mortality by 50 to70% patient is on statin

## 2021-04-21 NOTE — Assessment & Plan Note (Signed)
Denies any chest pain and angina is stable

## 2021-04-21 NOTE — Assessment & Plan Note (Signed)
Well-controlled on amiodarone

## 2021-05-12 ENCOUNTER — Other Ambulatory Visit: Payer: Self-pay

## 2021-05-12 MED ORDER — SPIRONOLACTONE 25 MG PO TABS: 25.0000 mg | ORAL_TABLET | Freq: Every day | ORAL | 0 refills | Status: DC

## 2021-05-15 ENCOUNTER — Ambulatory Visit (INDEPENDENT_AMBULATORY_CARE_PROVIDER_SITE_OTHER): Payer: 59 | Admitting: Cardiology

## 2021-05-15 ENCOUNTER — Other Ambulatory Visit: Payer: Self-pay

## 2021-05-15 ENCOUNTER — Encounter: Payer: Self-pay | Admitting: Cardiology

## 2021-05-15 VITALS — BP 150/80 | HR 77 | Ht 68.0 in | Wt 214.0 lb

## 2021-05-15 DIAGNOSIS — I471 Supraventricular tachycardia: Secondary | ICD-10-CM | POA: Diagnosis not present

## 2021-05-15 DIAGNOSIS — I5020 Unspecified systolic (congestive) heart failure: Secondary | ICD-10-CM | POA: Diagnosis not present

## 2021-05-15 DIAGNOSIS — I10 Essential (primary) hypertension: Secondary | ICD-10-CM

## 2021-05-15 DIAGNOSIS — Z951 Presence of aortocoronary bypass graft: Secondary | ICD-10-CM | POA: Diagnosis not present

## 2021-05-15 MED ORDER — TORSEMIDE 40 MG PO TABS: 40.0000 mg | ORAL_TABLET | Freq: Every day | ORAL | 0 refills | Status: DC

## 2021-05-15 NOTE — Patient Instructions (Signed)
Medication Instructions:   Your physician has recommended you make the following change in your medication:   STOP Lasix START Torsemide - take one tablet (40MG ) by mouth daily.    *If you need a refill on your cardiac medications before your next appointment, please call your pharmacy*   Lab Work:  Your physician recommends that you have lab work in 1 week - BMP  If you have labs (blood work) drawn today and your tests are completely normal, you will receive your results only by: [place] (if you have MyChart) OR A paper copy in the mail If you have any lab test that is abnormal or we need to change your treatment, we will call you to review the results.   Testing/Procedures:  None Ordered   Follow-Up: At [facility], you and your health needs are our priority.  As part of our continuing mission to provide you with exceptional heart care, we have created designated Provider Care Teams.  These Care Teams include your primary Cardiologist (physician) and Advanced Practice Providers (APPs -  Physician Assistants and Nurse Practitioners) who all work together to provide you with the care you need, when you need it.  We recommend signing up for the patient portal called "MyChart".  Sign up information is provided on this After Visit Summary.  MyChart is used to connect with patients for Virtual Visits (Telemedicine).  Patients are able to view lab/test results, encounter notes, upcoming appointments, etc.  Non-urgent messages can be sent to your provider as well.   To learn more about what you can do with MyChart, go to [email or web address].    Your next appointment:   6 week(s)  The format for your next appointment:   In Person  Provider:   [name], MD

## 2021-05-15 NOTE — Progress Notes (Signed)
Cardiology Office Note:    Date:  05/15/2021   ID:  Ricardo Brown, DOB October 15, 1960, MRN [id number]  PCP:  [name], MD  Cardiologist:  [name], MD  Electrophysiologist:  None   Referring MD: [name], MD   Chief Complaint  Patient presents with   Other    6 month follow up -- Patient c.o swelling and SOB. Meds reviewed verbally with patient.      History of Present Illness:    Ricardo Brown is a 60 y.o. male with a hx of SVT on amiodarone, CAD/CABG x3 in 2019, HFrEF EF 40-45%, hypertension, hyperlipidemia, diabetes, family history of premature CAD in the father (MI in his 20s) presents for follow-up.    He is being seen for ischemic cardiomyopathy.  Last echo showed EF was unchanged from prior about 40 to 45%.  He endorsed having shortness of breath with minimal exertion, leg edema.  Takes Lasix 40 mg daily without improvement.  Tolerating current doses of losartan, Aldactone.   Prior notes Echo 10/2020 EF 40 to 44%, grade 2 diastolic dysfunction. Patient was seen in the [place] in 06/ 2019 due to nausea, weakness, myalgias.  EKG at the time showed inferior ST elevation with reciprocal changes in the anterior leads and small inferior Q waves.  Patient was deemed late presenting STEMI.   Left heart cath was performed, briefly showed occluded distal RCA, 90% mid LAD, 90% proximal left circumflex.  Due to patient being a diabetic, and left heart cath showing diffuse three-vessel disease, CABG was recommended.  He underwent coronary artery bypass grafting x3 in July 2019 at [facility].  Delene Loll previously tried on patient, he did not tolerate.  He developed feeling of unwell, went into SVT and was taken to the emergency room.  Patient with history of SVT, who was seen in the emergency room and adenosine given x2 and placed on amiodarone  Echo on 09/2019 showed mild to moderately reduced ejection fraction, EF 40 to 45%.  Past Medical  History:  Diagnosis Date   CHF (congestive heart failure) (HCC)    Coronary artery disease    Diabetes mellitus without complication ([place])    a. Dx ~ 2000.   Heart palpitations    a. Pt reports prior nl echo's and stress tests. Last stress test w/in past 2 yrs - PCP.   High cholesterol    Hypertension    Myocardial infarction [facility])     Past Surgical History:  Procedure Laterality Date   CARDIAC CATHETERIZATION     CARDIAC VALVE REPLACEMENT     Mitral Valve Repair   COLONOSCOPY WITH PROPOFOL N/A 06/07/2019   Procedure: COLONOSCOPY WITH PROPOFOL;  Surgeon: [name], MD;  Location: [facility] ENDOSCOPY;  Service: Endoscopy;  Laterality: N/A;   COLONOSCOPY WITH PROPOFOL N/A 02/07/2020   Procedure: COLONOSCOPY WITH PROPOFOL;  Surgeon: [name], MD;  Location: [facility] ENDOSCOPY;  Service: Endoscopy;  Laterality: N/A;   COLONOSCOPY WITH PROPOFOL N/A 12/11/2020   Procedure: COLONOSCOPY WITH PROPOFOL;  Surgeon: [name], MD;  Location: [facility] ENDOSCOPY;  Service: Endoscopy;  Laterality: N/A;   CORONARY ANGIOPLASTY     CORONARY ARTERY BYPASS GRAFT     x 3   01/2018   ESOPHAGOGASTRODUODENOSCOPY N/A 02/07/2020   Procedure: ESOPHAGOGASTRODUODENOSCOPY (EGD);  Surgeon: [name], MD;  Location: [facility] ENDOSCOPY;  Service: Endoscopy;  Laterality: N/A;   LEFT HEART CATH AND CORONARY ANGIOGRAPHY N/A 01/24/2018   Procedure: LEFT HEART CATH AND  CORONARY ANGIOGRAPHY;  Surgeon: [name], MD;  Location: [place] CV LAB;  Service: Cardiovascular;  Laterality: N/A;   TONSILLECTOMY      Current Medications: Current Meds  Medication Sig   amiodarone (PACERONE) 200 MG tablet Take 1 tablet by mouth once daily   aspirin 81 MG EC tablet Take 81 mg by mouth daily.    b complex vitamins tablet Take 1 tablet by mouth daily.    Dulaglutide 1.5 MG/0.5ML SOPN Inject into the skin once a week.    metFORMIN (GLUCOPHAGE) 500 MG tablet TAKE 2 TABLETS BY MOUTH IN THE MORNING  AND 2 AT BEDTIME   metoprolol succinate (TOPROL-XL) 50 MG 24 hr tablet Take 50 mg by mouth daily. Take with or immediately following a meal.   Omega-3 Fatty Acids (FISH OIL) 875 MG CAPS Take 1 capsule by mouth 2 (two) times daily.   pramipexole (MIRAPEX) 1.5 MG tablet Take 1 tablet (1.5 mg total) by mouth daily.   spironolactone (ALDACTONE) 25 MG tablet Take 1 tablet (25 mg total) by mouth daily.   Torsemide 40 MG TABS Take 40 mg by mouth daily.   traZODone (DESYREL) 50 MG tablet    [DISCONTINUED] furosemide (LASIX) 20 MG tablet Take 2 tablets (40 mg total) by mouth daily.     Allergies:   Entresto [sacubitril-valsartan], Lipitor [atorvastatin], and Pregabalin   Social History   Socioeconomic History   Marital status: Married    Spouse name: Not on file   Number of children: Not on file   Years of education: Not on file   Highest education level: Not on file  Occupational History   Not on file  Tobacco Use   Smoking status: Never   Smokeless tobacco: Never  Vaping Use   Vaping Use: Never used  Substance and Sexual Activity   Alcohol use: Not Currently    Comment: rare beer   Drug use: No   Sexual activity: Not on file  Other Topics Concern   Not on file  Social History Narrative   Lives locally with wife.  Works in [employer].  Does not routinely exercise.   Social Determinants of Health   Financial Resource Strain: Not on file  Food Insecurity: Not on file  Transportation Needs: Not on file  Physical Activity: Not on file  Stress: Not on file  Social Connections: Not on file     Family History: The patient's family history includes CAD in his father; Cancer in his father and mother; Other in his brother.  ROS:   Please see the history of present illness.     All other systems reviewed and are negative.  EKGs/Labs/Other Studies Reviewed:    The following studies were reviewed today:   EKG:  EKG is  ordered today.  The ekg ordered today demonstrates sinus  rhythm, right bundle branch block  Recent Labs: 09/30/2020: ALT 26; BUN 23; Creat 1.31; Hemoglobin 13.7; Platelets 214; Potassium 4.4; Sodium 137; TSH 2.53  Recent Lipid Panel    Component Value Date/Time   CHOL 122 09/30/2020 1114   TRIG 121 09/30/2020 1114   HDL 35 (L) 09/30/2020 1114   CHOLHDL 3.5 09/30/2020 1114   VLDL 24 01/24/2018 1459   LDLCALC 67 09/30/2020 1114    Physical Exam:    VS:  BP (!) 150/80 (BP Location: Left Arm, Patient Position: Sitting, Cuff Size: Normal)   Pulse 77   Ht 5\' 8"  (1.727 m)   Wt 214 lb (97.1 kg)  SpO2 99%   BMI 32.54 kg/m     Wt Readings from Last 3 Encounters:  05/15/21 214 lb (97.1 kg)  04/21/21 212 lb (96.2 kg)  12/11/20 213 lb (96.6 kg)     GEN:  Well nourished, well developed in no acute distress HEENT: Normal NECK: No JVD; No carotid bruits LYMPHATICS: No lymphadenopathy CARDIAC: RRR, no murmurs, rubs, gallops RESPIRATORY:  Clear to auscultation without rales, wheezing or rhonchi  ABDOMEN: Soft, non-tender, non-distended MUSCULOSKELETAL:  1-2+ edema; No deformity  SKIN: Warm and dry NEUROLOGIC:  Alert and oriented x 3 PSYCHIATRIC:  Normal affect   ASSESSMENT:    1. Hx of CABG   2. HFrEF (heart failure with reduced ejection fraction) ([place])   3. Primary hypertension   4. SVT (supraventricular tachycardia) (HCC)     PLAN:    In order of problems listed above:  CAD status post CABG x3 in 2019. Denies chest pain.  Continue aspirin and Crestor. ICM, echo 10/2020 EF 40 to 45%.  Unchanged from prior.  Describes NYHA class Brown symptoms.  Appears volume overloaded with leg edema.  Stop Lasix, start torsemide 40 mg daily.  Check BMP in 1 week.  Continue Aldactone 25 mg daily, losartan 100 mg daily, Toprol-XL 50 mg daily.  Did not tolerate Entresto in the past, went into SVT. Blood pressure reasonably controlled.  Continue losartan 100 mg daily, Toprol-XL, Aldactone 25 mg daily, torsemide.  Consider switch to Coreg if BP still  elevated at follow-up visit. History of SVT.  Continue amiodarone 200 mg daily.  Follow-up in 6 weeks.  Total encounter time 40 minutes  Greater than 50% was spent in counseling and coordination of care with the patient   Medication Adjustments/Labs and Tests Ordered: Current medicines are reviewed at length with the patient today.  Concerns regarding medicines are outlined above.  Orders Placed This Encounter  Procedures   Basic metabolic panel   EKG 67-HALP    Meds ordered this encounter  Medications   Torsemide 40 MG TABS    Sig: Take 40 mg by mouth daily.    Dispense:  90 tablet    Refill:  0     Patient Instructions  Medication Instructions:   Your physician has recommended you make the following change in your medication:   STOP Lasix START Torsemide - take one tablet (40MG ) by mouth daily.    *If you need a refill on your cardiac medications before your next appointment, please call your pharmacy*   Lab Work:  Your physician recommends that you have lab work in 1 week - BMP  If you have labs (blood work) drawn today and your tests are completely normal, you will receive your results only by: [place] (if you have MyChart) OR A paper copy in the mail If you have any lab test that is abnormal or we need to change your treatment, we will call you to review the results.   Testing/Procedures:  None Ordered   Follow-Up: At [facility], you and your health needs are our priority.  As part of our continuing mission to provide you with exceptional heart care, we have created designated Provider Care Teams.  These Care Teams include your primary Cardiologist (physician) and Advanced Practice Providers (APPs -  Physician Assistants and Nurse Practitioners) who all work together to provide you with the care you need, when you need it.  We recommend signing up for the patient portal called "MyChart".  Sign up information is provided  on this After Visit  Summary.  MyChart is used to connect with patients for Virtual Visits (Telemedicine).  Patients are able to view lab/test results, encounter notes, upcoming appointments, etc.  Non-urgent messages can be sent to your provider as well.   To learn more about what you can do with MyChart, go to [email or web address].    Your next appointment:   6 week(s)  The format for your next appointment:   In Person  Provider:   [name], MD     Signed, [name], MD  05/15/2021 2:08 PM    [place]

## 2021-05-21 ENCOUNTER — Other Ambulatory Visit: Payer: Self-pay

## 2021-05-21 ENCOUNTER — Encounter: Payer: Self-pay | Admitting: Internal Medicine

## 2021-05-21 ENCOUNTER — Ambulatory Visit (INDEPENDENT_AMBULATORY_CARE_PROVIDER_SITE_OTHER): Payer: 59 | Admitting: Internal Medicine

## 2021-05-21 DIAGNOSIS — Z951 Presence of aortocoronary bypass graft: Secondary | ICD-10-CM | POA: Diagnosis not present

## 2021-05-21 DIAGNOSIS — I25810 Atherosclerosis of coronary artery bypass graft(s) without angina pectoris: Secondary | ICD-10-CM

## 2021-05-21 DIAGNOSIS — E119 Type 2 diabetes mellitus without complications: Secondary | ICD-10-CM | POA: Diagnosis not present

## 2021-05-21 DIAGNOSIS — G2581 Restless legs syndrome: Secondary | ICD-10-CM

## 2021-05-21 DIAGNOSIS — I10 Essential (primary) hypertension: Secondary | ICD-10-CM

## 2021-05-21 MED ORDER — AMIODARONE HCL 200 MG PO TABS: 200.0000 mg | ORAL_TABLET | Freq: Every day | ORAL | 1 refills | Status: DC

## 2021-05-21 MED ORDER — PRAMIPEXOLE DIHYDROCHLORIDE 1.5 MG PO TABS: 1.5000 mg | ORAL_TABLET | Freq: Every day | ORAL | 3 refills | Status: DC

## 2021-05-21 MED ORDER — PRAMIPEXOLE DIHYDROCHLORIDE 1.5 MG PO TABS: 1.5000 mg | ORAL_TABLET | Freq: Two times a day (BID) | ORAL | 3 refills | Status: DC

## 2021-05-21 MED ORDER — METOPROLOL SUCCINATE ER 50 MG PO TB24: 50.0000 mg | ORAL_TABLET | Freq: Every day | ORAL | 3 refills | Status: DC

## 2021-05-21 NOTE — Assessment & Plan Note (Signed)
Continue Mirapex twice a day

## 2021-05-21 NOTE — Assessment & Plan Note (Signed)
Patient does not have any angina 

## 2021-05-21 NOTE — Assessment & Plan Note (Signed)

## 2021-05-21 NOTE — Progress Notes (Signed)
Established Patient Office Visit  Subjective:  Patient ID: Ricardo Brown, male    DOB: 07-04-1961  Age: 60 y.o. MRN: [id number]  CC:  Chief Complaint  Patient presents with   Follow-up    HPI  Ricardo Brown Brown presents for checkup on diabetes and high blood pressure.  Patient complaining of restless leg syndrome.  He is trying to lose weight he was advised to walk on a daily basis.  Patient does not smoke does not drink.  Past Medical History:  Diagnosis Date   CHF (congestive heart failure) (HCC)    Coronary artery disease    Diabetes mellitus without complication ([place])    a. Dx ~ 2000.   Heart palpitations    a. Pt reports prior nl echo's and stress tests. Last stress test w/in past 2 yrs - PCP.   High cholesterol    Hypertension    Myocardial infarction [facility])     Past Surgical History:  Procedure Laterality Date   CARDIAC CATHETERIZATION     CARDIAC VALVE REPLACEMENT     Mitral Valve Repair   COLONOSCOPY WITH PROPOFOL N/A 06/07/2019   Procedure: COLONOSCOPY WITH PROPOFOL;  Surgeon: [name], MD;  Location: [facility] ENDOSCOPY;  Service: Endoscopy;  Laterality: N/A;   COLONOSCOPY WITH PROPOFOL N/A 02/07/2020   Procedure: COLONOSCOPY WITH PROPOFOL;  Surgeon: [name], MD;  Location: [facility] ENDOSCOPY;  Service: Endoscopy;  Laterality: N/A;   COLONOSCOPY WITH PROPOFOL N/A 12/11/2020   Procedure: COLONOSCOPY WITH PROPOFOL;  Surgeon: [name], MD;  Location: [facility] ENDOSCOPY;  Service: Endoscopy;  Laterality: N/A;   CORONARY ANGIOPLASTY     CORONARY ARTERY BYPASS GRAFT     x 3   01/2018   ESOPHAGOGASTRODUODENOSCOPY N/A 02/07/2020   Procedure: ESOPHAGOGASTRODUODENOSCOPY (EGD);  Surgeon: [name], MD;  Location: [facility] ENDOSCOPY;  Service: Endoscopy;  Laterality: N/A;   LEFT HEART CATH AND CORONARY ANGIOGRAPHY N/A 01/24/2018   Procedure: LEFT HEART CATH AND CORONARY ANGIOGRAPHY;  Surgeon: [name], MD;  Location: [place] CV LAB;  Service: Cardiovascular;  Laterality: N/A;   TONSILLECTOMY      Family History  Problem Relation Age of Onset   Cancer Mother        died @ 61   CAD Father        First MI @ 60. S/p heart transplant. Died in mid-50's of cancer.   Cancer Father    Other Brother        alive and well    Social History   Socioeconomic History   Marital status: Married    Spouse name: Not on file   Number of children: Not on file   Years of education: Not on file   Highest education level: Not on file  Occupational History   Not on file  Tobacco Use   Smoking status: Never   Smokeless tobacco: Never  Vaping Use   Vaping Use: Never used  Substance and Sexual Activity   Alcohol use: Not Currently    Comment: rare beer   Drug use: No   Sexual activity: Not on file  Other Topics Concern   Not on file  Social History Narrative   Lives locally with wife.  Works in [employer].  Does not routinely exercise.   Social Determinants of Health   Financial Resource Strain: Not on file  Food Insecurity: Not on file  Transportation Needs: Not on file  Physical Activity: Not on file  Stress: Not on file  Social Connections: Not on file  Intimate Partner Violence: Not on file     Current Outpatient Medications:    aspirin 81 MG EC tablet, Take 81 mg by mouth daily. , Disp: , Rfl:    b complex vitamins tablet, Take 1 tablet by mouth daily. , Disp: , Rfl:    Dulaglutide 1.5 MG/0.5ML SOPN, Inject into the skin once a week. , Disp: , Rfl:    metFORMIN (GLUCOPHAGE) 500 MG tablet, TAKE 2 TABLETS BY MOUTH IN THE MORNING AND 2 AT BEDTIME, Disp: 360 tablet, Rfl: 0   Omega-3 Fatty Acids (FISH OIL) 875 MG CAPS, Take 1 capsule by mouth 2 (two) times daily., Disp: , Rfl:    spironolactone (ALDACTONE) 25 MG tablet, Take 1 tablet (25 mg total) by mouth daily., Disp: 90 tablet, Rfl: 0   Torsemide 40 MG TABS, Take 40 mg by mouth daily., Disp: 90 tablet, Rfl: 0   traZODone (DESYREL) 50 MG  tablet, , Disp: , Rfl:    amiodarone (PACERONE) 200 MG tablet, Take 1 tablet (200 mg total) by mouth daily., Disp: 90 tablet, Rfl: 1   losartan (COZAAR) 100 MG tablet, Take 1 tablet (100 mg total) by mouth daily., Disp: 90 tablet, Rfl: 3   metoprolol succinate (TOPROL-XL) 50 MG 24 hr tablet, Take 1 tablet (50 mg total) by mouth daily. Take with or immediately following a meal., Disp: 30 tablet, Rfl: 3   rosuvastatin (CRESTOR) 20 MG tablet, Take 1 tablet (20 mg total) by mouth daily., Disp: 90 tablet, Rfl: 3   Allergies  Allergen Reactions   Entresto [Sacubitril-Valsartan] Other (See Comments)    SVT   Lipitor [Atorvastatin] Palpitations    SVT   Pregabalin Other (See Comments)    ROS Review of Systems  Constitutional: Negative.   HENT: Negative.    Eyes: Negative.   Respiratory: Negative.    Cardiovascular: Negative.   Gastrointestinal: Negative.   Endocrine: Negative.   Genitourinary:  Positive for penile pain.  Musculoskeletal: Negative.   Skin: Negative.   Allergic/Immunologic: Negative.   Neurological: Negative.   Hematological: Negative.   Psychiatric/Behavioral: Negative.    All other systems reviewed and are negative.    Objective:    Physical Exam  There were no vitals taken for this visit. Wt Readings from Last 3 Encounters:  05/15/21 214 lb (97.1 kg)  04/21/21 212 lb (96.2 kg)  12/11/20 213 lb (96.6 kg)     Health Maintenance Due  Topic Date Due   HEMOGLOBIN A1C  Never done   FOOT EXAM  Never done   URINE MICROALBUMIN  Never done   HIV Screening  Never done   Hepatitis C Screening  Never done    There are no preventive care reminders to display for this patient.  Lab Results  Component Value Date   TSH 2.53 09/30/2020   Lab Results  Component Value Date   WBC 7.7 09/30/2020   HGB 13.7 09/30/2020   HCT 40.7 09/30/2020   MCV 88.5 09/30/2020   PLT 214 09/30/2020   Lab Results  Component Value Date   NA 137 09/30/2020   K 4.4 09/30/2020    CO2 24 09/30/2020   GLUCOSE 106 (H) 09/30/2020   BUN 23 09/30/2020   CREATININE 1.31 09/30/2020   BILITOT 0.4 09/30/2020   ALKPHOS 29 (L) 01/24/2018   AST 27 09/30/2020   ALT 26 09/30/2020   PROT 6.5 09/30/2020   ALBUMIN 3.8 01/24/2018  CALCIUM 9.0 09/30/2020   ANIONGAP 13 08/23/2020   Lab Results  Component Value Date   CHOL 122 09/30/2020   Lab Results  Component Value Date   HDL 35 (L) 09/30/2020   Lab Results  Component Value Date   LDLCALC 67 09/30/2020   Lab Results  Component Value Date   TRIG 121 09/30/2020   Lab Results  Component Value Date   CHOLHDL 3.5 09/30/2020   No results found for: HGBA1C    Assessment & Plan:   Problem List Items Addressed This Visit       Cardiovascular and Mediastinum   Hypertension - Primary    The following hypertensive lifestyle modification were recommended and discussed:  1. Limiting alcohol intake to less than 1 oz/day of ethanol:(24 oz of beer or 8 oz of wine or 2 oz of 100-proof whiskey). 2. Take baby ASA 81 mg daily. 3. Importance of regular aerobic exercise and losing weight. 4. Reduce dietary saturated fat and cholesterol intake for overall cardiovascular health. 5. Maintaining adequate dietary potassium, calcium, and magnesium intake. 6. Regular monitoring of the blood pressure. 7. Reduce sodium intake to less than 100 mmol/day (less than 2.3 gm of sodium or less than 6 gm of sodium choride)       Relevant Medications   metoprolol succinate (TOPROL-XL) 50 MG 24 hr tablet   amiodarone (PACERONE) 200 MG tablet   Coronary artery disease    Patient does not have any angina      Relevant Medications   metoprolol succinate (TOPROL-XL) 50 MG 24 hr tablet   amiodarone (PACERONE) 200 MG tablet     Endocrine   Diabetes mellitus type 2, uncomplicated ([place])    - The patient's blood sugar is labile on med. - The patient will continue the current treatment regimen.  - I encouraged the patient to regularly check  blood sugar.  - I encouraged the patient to monitor diet. I encouraged the patient to eat low-carb and low-sugar to help prevent blood sugar spikes.  - I encouraged the patient to continue following their prescribed treatment plan for diabetes - I informed the patient to get help if blood sugar drops below 54mg /dL, or if suddenly have trouble thinking clearly or breathing.  Patient was advised to buy a book on diabetes from a local bookstore or from [place]  Patient should read 2 chapters every day to keep the motivation going, this is in addition to some of the materials we provided them from the office.  There are other resources on the Internet like YouTube and wilkipedia to get an education on the diabetes        Other   S/P CABG x 3    Hypercholesterolemia  I advised the patient to follow Mediterranean diet This diet is rich in fruits vegetables and whole grain, and This diet is also rich in fish and lean meat Patient should also eat a handful of almonds or walnuts daily Recent heart study indicated that average follow-up on this kind of diet reduces the cardiovascular mortality by 50 to 70%==      Restless leg syndrome    Continue Mirapex twice a day       Meds ordered this encounter  Medications   metoprolol succinate (TOPROL-XL) 50 MG 24 hr tablet    Sig: Take 1 tablet (50 mg total) by mouth daily. Take with or immediately following a meal.    Dispense:  30 tablet    Refill:  3  amiodarone (PACERONE) 200 MG tablet    Sig: Take 1 tablet (200 mg total) by mouth daily.    Dispense:  90 tablet    Refill:  1   DISCONTD: pramipexole (MIRAPEX) 1.5 MG tablet    Sig: Take 1 tablet (1.5 mg total) by mouth daily.    Dispense:  90 tablet    Refill:  3    Follow-up: No follow-ups on file.    [name], MD

## 2021-05-21 NOTE — Assessment & Plan Note (Signed)
Hypercholesterolemia  I advised the patient to follow Mediterranean diet This diet is rich in fruits vegetables and whole grain, and This diet is also rich in fish and lean meat Patient should also eat a handful of almonds or walnuts daily Recent heart study indicated that average follow-up on this kind of diet reduces the cardiovascular mortality by 50 to 70%== 

## 2021-05-21 NOTE — Assessment & Plan Note (Signed)
The following hypertensive lifestyle modification were recommended and discussed:  1. Limiting alcohol intake to less than 1 oz/day of ethanol:(24 oz of beer or 8 oz of wine or 2 oz of 100-proof whiskey). 2. Take baby ASA 81 mg daily. 3. Importance of regular aerobic exercise and losing weight. 4. Reduce dietary saturated fat and cholesterol intake for overall cardiovascular health. 5. Maintaining adequate dietary potassium, calcium, and magnesium intake. 6. Regular monitoring of the blood pressure. 7. Reduce sodium intake to less than 100 mmol/day (less than 2.3 gm of sodium or less than 6 gm of sodium choride)  

## 2021-05-22 ENCOUNTER — Other Ambulatory Visit (INDEPENDENT_AMBULATORY_CARE_PROVIDER_SITE_OTHER): Payer: 59

## 2021-05-22 ENCOUNTER — Encounter: Payer: Self-pay | Admitting: Internal Medicine

## 2021-05-22 ENCOUNTER — Other Ambulatory Visit: Payer: Self-pay | Admitting: Cardiology

## 2021-05-22 DIAGNOSIS — I471 Supraventricular tachycardia: Secondary | ICD-10-CM | POA: Diagnosis not present

## 2021-05-22 DIAGNOSIS — I5020 Unspecified systolic (congestive) heart failure: Secondary | ICD-10-CM | POA: Diagnosis not present

## 2021-05-22 DIAGNOSIS — I10 Essential (primary) hypertension: Secondary | ICD-10-CM

## 2021-05-23 LAB — BASIC METABOLIC PANEL
BUN/Creatinine Ratio: 20 (ref 9–20)
BUN: 28 mg/dL — ABNORMAL HIGH (ref 6–24)
CO2: 20 mmol/L (ref 20–29)
Calcium: 9.6 mg/dL (ref 8.7–10.2)
Chloride: 98 mmol/L (ref 96–106)
Creatinine, Ser: 1.37 mg/dL — ABNORMAL HIGH (ref 0.76–1.27)
Glucose: 164 mg/dL — ABNORMAL HIGH (ref 70–99)
Potassium: 5 mmol/L (ref 3.5–5.2)
Sodium: 136 mmol/L (ref 134–144)
eGFR: 59 mL/min/{1.73_m2} — ABNORMAL LOW (ref >59)

## 2021-05-23 LAB — HEMOGLOBIN A1C: Hemoglobin A1C: 7.2

## 2021-05-27 ENCOUNTER — Ambulatory Visit (INDEPENDENT_AMBULATORY_CARE_PROVIDER_SITE_OTHER): Payer: 59 | Admitting: Gastroenterology

## 2021-05-27 ENCOUNTER — Other Ambulatory Visit: Payer: Self-pay | Admitting: *Deleted

## 2021-05-27 ENCOUNTER — Encounter: Payer: Self-pay | Admitting: Gastroenterology

## 2021-05-27 VITALS — BP 136/68 | HR 69 | Temp 97.5°F | Ht 68.0 in | Wt 209.0 lb

## 2021-05-27 DIAGNOSIS — K8689 Other specified diseases of pancreas: Secondary | ICD-10-CM

## 2021-05-27 DIAGNOSIS — K219 Gastro-esophageal reflux disease without esophagitis: Secondary | ICD-10-CM

## 2021-05-27 DIAGNOSIS — R197 Diarrhea, unspecified: Secondary | ICD-10-CM | POA: Diagnosis not present

## 2021-05-27 DIAGNOSIS — I25810 Atherosclerosis of coronary artery bypass graft(s) without angina pectoris: Secondary | ICD-10-CM

## 2021-05-27 MED ORDER — OMEPRAZOLE 40 MG PO CPDR: 40.0000 mg | DELAYED_RELEASE_CAPSULE | Freq: Every day | ORAL | 0 refills | Status: DC

## 2021-05-27 MED ORDER — METOPROLOL SUCCINATE ER 50 MG PO TB24: 50.0000 mg | ORAL_TABLET | Freq: Every day | ORAL | 3 refills | Status: DC

## 2021-05-28 NOTE — Progress Notes (Signed)
[name], MD [address]  [place]  [place], [place] [address]  Main: [phone]  Fax: [phone]   Primary Care Physician: [name], MD   Chief Complaint  Patient presents with   New Patient (Initial Visit)    Pt reports that his burps have a foul odor and has had watery stools x 2 months along with N/V.... Pt denies blood in stools... But has had incontinence and bowel leakage    HPI: Ricardo Brown is a 60 y.o. male reporting foul-smelling burps.  Has not been taking any PPI or any other medications, over-the-counter for reflux.  No dysphagia.  No nausea or vomiting.  Is also reporting loose stools for the last 1 to 2 months.  No blood in stool.  No weight loss.  Previously seen in 2020 for abdominal bloating and had reported alternating constipation and diarrhea at that time.  Random colon Biopsies in 2020 were negative for microscopic colitis.  Most recent colonoscopy in May 2022 with 5 subcentimeter polyps removed.  Tattoo noted in the hepatic flexure site did not show any residual polyp tissue.  Small lipoma seen as well as hepatic flexure.  Pathology showed tubular adenoma polyps and lipoma biopsies were consistent with lipoma.  Most recent upper endoscopy in July 2021 -short segment salmon-colored mucosa seen in the distal esophagus with biopsies showing reflux gastroesophagitis.  Mucosal changes in the duodenum biopsied.  This showed gastric heterotopia.  Liver enzymes reassuring on recent CMP  ROS: All ROS reviewed and negative except as per HPI   Past Medical History:  Diagnosis Date   CHF (congestive heart failure) (HCC)    Coronary artery disease    Diabetes mellitus without complication ([place])    a. Dx ~ 2000.   Heart palpitations    a. Pt reports prior nl echo's and stress tests. Last stress test w/in past 2 yrs - PCP.   High cholesterol    Hypertension    Myocardial infarction [facility])     Past Surgical History:  Procedure  Laterality Date   CARDIAC CATHETERIZATION     CARDIAC VALVE REPLACEMENT     Mitral Valve Repair   COLONOSCOPY WITH PROPOFOL N/A 06/07/2019   Procedure: COLONOSCOPY WITH PROPOFOL;  Surgeon: [name], MD;  Location: [facility] ENDOSCOPY;  Service: Endoscopy;  Laterality: N/A;   COLONOSCOPY WITH PROPOFOL N/A 02/07/2020   Procedure: COLONOSCOPY WITH PROPOFOL;  Surgeon: [name], MD;  Location: [facility] ENDOSCOPY;  Service: Endoscopy;  Laterality: N/A;   COLONOSCOPY WITH PROPOFOL N/A 12/11/2020   Procedure: COLONOSCOPY WITH PROPOFOL;  Surgeon: [name], MD;  Location: [facility] ENDOSCOPY;  Service: Endoscopy;  Laterality: N/A;   CORONARY ANGIOPLASTY     CORONARY ARTERY BYPASS GRAFT     x 3   01/2018   ESOPHAGOGASTRODUODENOSCOPY N/A 02/07/2020   Procedure: ESOPHAGOGASTRODUODENOSCOPY (EGD);  Surgeon: [name], MD;  Location: [facility] ENDOSCOPY;  Service: Endoscopy;  Laterality: N/A;   LEFT HEART CATH AND CORONARY ANGIOGRAPHY N/A 01/24/2018   Procedure: LEFT HEART CATH AND CORONARY ANGIOGRAPHY;  Surgeon: [name], MD;  Location: [place] CV LAB;  Service: Cardiovascular;  Laterality: N/A;   TONSILLECTOMY      Prior to Admission medications   Medication Sig Start Date End Date Taking? Authorizing Provider  amiodarone (PACERONE) 200 MG tablet Take 1 tablet (200 mg total) by mouth daily. 05/21/21  Yes Masoud, Viann Shove, MD  aspirin 81 MG EC tablet Take 81 mg by mouth  daily.    Yes [provider]  b complex vitamins tablet Take 1 tablet by mouth daily.    Yes [provider]  Dulaglutide 1.5 MG/0.5ML SOPN Inject into the skin once a week.  03/30/18  Yes [provider]  metFORMIN (GLUCOPHAGE) 500 MG tablet TAKE 2 TABLETS BY MOUTH IN THE MORNING AND 2 AT BEDTIME 04/21/21  Yes Masoud, Viann Shove, MD  Omega-3 Fatty Acids (FISH OIL) 875 MG CAPS Take 1 capsule by mouth 2 (two) times daily.   Yes [provider]  omeprazole (PRILOSEC) 40 MG  capsule Take 1 capsule (40 mg total) by mouth daily. 05/27/21 06/26/21 Yes [name], MD  pramipexole (MIRAPEX) 1.5 MG tablet Take 1 tablet (1.5 mg total) by mouth 2 (two) times daily. 05/21/21  Yes Masoud, Viann Shove, MD  spironolactone (ALDACTONE) 25 MG tablet Take 1 tablet (25 mg total) by mouth daily. 05/12/21  Yes Agbor-Etang, Aaron Edelman, MD  Torsemide 40 MG TABS Take 40 mg by mouth daily. 05/15/21  Yes [name], MD  traZODone (DESYREL) 50 MG tablet  10/01/19  Yes [provider]  losartan (COZAAR) 100 MG tablet Take 1 tablet (100 mg total) by mouth daily. 09/02/20 12/01/20  [name], MD  metoprolol succinate (TOPROL-XL) 50 MG 24 hr tablet Take 1 tablet (50 mg total) by mouth daily. Take with or immediately following a meal. 05/27/21   [name], MD  rosuvastatin (CRESTOR) 20 MG tablet Take 1 tablet (20 mg total) by mouth daily. 09/02/20 12/01/20  [name], MD    Family History  Problem Relation Age of Onset   Cancer Mother        died @ 56   CAD Father        First MI @ 62. S/p heart transplant. Died in mid-50's of cancer.   Cancer Father    Other Brother        alive and well     Social History   Tobacco Use   Smoking status: Never   Smokeless tobacco: Never  Vaping Use   Vaping Use: Never used  Substance Use Topics   Alcohol use: Not Currently    Comment: rare beer   Drug use: No    Allergies as of 05/27/2021 - Review Complete 05/27/2021  Allergen Reaction Noted   Entresto [sacubitril-valsartan] Other (See Comments) 06/07/2019   Lipitor [atorvastatin] Palpitations 04/23/2015   Pregabalin Other (See Comments) 10/02/2019    Physical Examination:  Constitutional: General:   Alert,  Well-developed, well-nourished, pleasant and cooperative in NAD BP 136/68   Pulse 69   Temp (!) 97.5 F (36.4 C) (Oral)   Ht 5\' 8"  (1.727 m)   Wt 209 lb (94.8 kg)   BMI 31.78 kg/m   Respiratory: Normal respiratory effort  Gastrointestinal:   Soft, non-tender and non-distended without masses, hepatosplenomegaly or hernias noted.  No guarding or rebound tenderness.     Cardiac: No clubbing or edema.  No cyanosis. Normal posterior tibial pedal pulses noted.  Psych:  Alert and cooperative. Normal mood and affect.  Musculoskeletal:  Normal gait. Head normocephalic, atraumatic. Symmetrical without gross deformities. 5/5 Lower extremity strength bilaterally.  Skin: Warm. Intact without significant lesions or rashes. No jaundice.  Neck: Supple, trachea midline  Lymph: No cervical lymphadenopathy  Psych:  Alert and oriented x3, Alert and cooperative. Normal mood and affect.  Labs: CMP     Component Value Date/Time   NA 136 05/22/2021 0910   K 5.0 05/22/2021 0910  CL 98 05/22/2021 0910   CO2 20 05/22/2021 0910   GLUCOSE 164 (H) 05/22/2021 0910   GLUCOSE 106 (H) 09/30/2020 1114   BUN 28 (H) 05/22/2021 0910   CREATININE 1.37 (H) 05/22/2021 0910   CREATININE 1.31 09/30/2020 1114   CALCIUM 9.6 05/22/2021 0910   PROT 6.5 09/30/2020 1114   ALBUMIN 3.8 01/24/2018 1108   AST 27 09/30/2020 1114   ALT 26 09/30/2020 1114   ALKPHOS 29 (L) 01/24/2018 1108   BILITOT 0.4 09/30/2020 1114   GFRNONAA 59 (L) 09/30/2020 1114   GFRAA 69 09/30/2020 1114   Lab Results  Component Value Date   WBC 7.7 09/30/2020   HGB 13.7 09/30/2020   HCT 40.7 09/30/2020   MCV 88.5 09/30/2020   PLT 214 09/30/2020    Imaging Studies:   Assessment and Plan:   Ricardo Brown is a 60 y.o. y/o male presents for follow-up and is reporting symptoms of "foul smelling burps", and loose stools with previous history of alternating constipation and diarrhea when previously seen in October 2020 and biopsies negative for microscopic colitis at that time  Start PPI therapy since patient is not on any medications and is having symptoms of reflux  Patient educated extensively on acid reflux lifestyle modification, including using a bed wedge, not eating  3 hrs before bedtime, diet modifications, and handout given for the same.   Obtain GI profile and fecal elastase for loose stools  If symptoms do not improve in 3-4 weeks pt advised to call us  Ok to start using metamucil to help bulk stool. Pt advised to avoid excessive caffeine intake or beverages with sugar or sugar substitutes as it can worsen diarrhea  (Risks of PPI use were discussed with patient including bone loss, C. Diff diarrhea, pneumonia, infections, CKD, electrolyte abnormalities.  Pt. Verbalizes understanding and chooses to continue the medication.)    Dr [name]

## 2021-06-01 LAB — GI PROFILE, STOOL, PCR

## 2021-06-01 LAB — PANCREATIC ELASTASE, FECAL: Pancreatic Elastase, Fecal: 172 ug Elast./g — ABNORMAL LOW (ref >200)

## 2021-06-03 MED ORDER — ZENPEP 40000-126000 UNITS PO CPEP: ORAL_CAPSULE | ORAL | 1 refills | Status: DC

## 2021-06-03 NOTE — Addendum Note (Signed)
Addended by: [name] on: 06/03/2021 01:55 PM   Modules accepted: Orders

## 2021-06-08 ENCOUNTER — Other Ambulatory Visit: Payer: Self-pay | Admitting: Internal Medicine

## 2021-06-12 NOTE — Addendum Note (Signed)
Addended by: Lurlean Nanny on: 06/12/2021 06:02 PM   Modules accepted: Orders

## 2021-06-16 ENCOUNTER — Other Ambulatory Visit: Payer: Self-pay | Admitting: *Deleted

## 2021-06-16 MED ORDER — METFORMIN HCL 500 MG PO TABS: ORAL_TABLET | ORAL | 6 refills | Status: DC

## 2021-06-17 ENCOUNTER — Other Ambulatory Visit: Payer: Self-pay | Admitting: Gastroenterology

## 2021-06-17 ENCOUNTER — Other Ambulatory Visit: Payer: Self-pay | Admitting: Internal Medicine

## 2021-06-17 DIAGNOSIS — E119 Type 2 diabetes mellitus without complications: Secondary | ICD-10-CM

## 2021-06-18 ENCOUNTER — Ambulatory Visit (INDEPENDENT_AMBULATORY_CARE_PROVIDER_SITE_OTHER): Payer: 59 | Admitting: Internal Medicine

## 2021-06-18 ENCOUNTER — Encounter: Payer: Self-pay | Admitting: Internal Medicine

## 2021-06-18 ENCOUNTER — Other Ambulatory Visit: Payer: Self-pay

## 2021-06-18 VITALS — BP 148/81 | HR 77 | Ht 68.0 in | Wt 213.3 lb

## 2021-06-18 DIAGNOSIS — I519 Heart disease, unspecified: Secondary | ICD-10-CM | POA: Diagnosis not present

## 2021-06-18 DIAGNOSIS — E119 Type 2 diabetes mellitus without complications: Secondary | ICD-10-CM

## 2021-06-18 DIAGNOSIS — I10 Essential (primary) hypertension: Secondary | ICD-10-CM | POA: Diagnosis not present

## 2021-06-18 DIAGNOSIS — G2581 Restless legs syndrome: Secondary | ICD-10-CM

## 2021-06-18 MED ORDER — METFORMIN HCL 500 MG PO TABS: 1000.0000 mg | ORAL_TABLET | Freq: Two times a day (BID) | ORAL | 2 refills | Status: DC

## 2021-06-18 NOTE — Addendum Note (Signed)
Addended by: Anson [place] R on: 06/18/2021 03:13 PM   Modules accepted: Orders

## 2021-06-18 NOTE — Progress Notes (Signed)
Established Patient Office Visit  Subjective:  Patient ID: Ricardo Brown, male    DOB: 09/02/1960  Age: 60 y.o. MRN: [id number]  CC:  Chief Complaint  Patient presents with   Hypertension    1 month follow up Patient checked blood sugar at home. Results were 150    Hypertension   Ricardo Brown presents for general checkup.  Patient is known to have coronary artery disease congestive heart failure palpitation.pt has seen gi  recently  Past Medical History:  Diagnosis Date   CHF (congestive heart failure) ([place])    Coronary artery disease    Diabetes mellitus without complication ([place])    a. Dx ~ 2000.   Heart palpitations    a. Pt reports prior nl echo's and stress tests. Last stress test w/in past 2 yrs - PCP.   High cholesterol    Hypertension    Myocardial infarction [facility])     Past Surgical History:  Procedure Laterality Date   CARDIAC CATHETERIZATION     CARDIAC VALVE REPLACEMENT     Mitral Valve Repair   COLONOSCOPY WITH PROPOFOL N/A 06/07/2019   Procedure: COLONOSCOPY WITH PROPOFOL;  Surgeon: [name], MD;  Location: [facility] ENDOSCOPY;  Service: Endoscopy;  Laterality: N/A;   COLONOSCOPY WITH PROPOFOL N/A 02/07/2020   Procedure: COLONOSCOPY WITH PROPOFOL;  Surgeon: [name], MD;  Location: [facility] ENDOSCOPY;  Service: Endoscopy;  Laterality: N/A;   COLONOSCOPY WITH PROPOFOL N/A 12/11/2020   Procedure: COLONOSCOPY WITH PROPOFOL;  Surgeon: [name], MD;  Location: [facility] ENDOSCOPY;  Service: Endoscopy;  Laterality: N/A;   CORONARY ANGIOPLASTY     CORONARY ARTERY BYPASS GRAFT     x 3   01/2018   ESOPHAGOGASTRODUODENOSCOPY N/A 02/07/2020   Procedure: ESOPHAGOGASTRODUODENOSCOPY (EGD);  Surgeon: [name], MD;  Location[facility] ENDOSCOPY;  Service: Endoscopy;  Laterality: N/A;   LEFT HEART CATH AND CORONARY ANGIOGRAPHY N/A 01/24/2018   Procedure: LEFT HEART CATH AND CORONARY ANGIOGRAPHY;  Surgeon: [name], MD;   Location: [place] CV LAB;  Service: Cardiovascular;  Laterality: N/A;   TONSILLECTOMY      Family History  Problem Relation Age of Onset   Cancer Mother        died @ 53   CAD Father        First MI @ 33. S/p heart transplant. Died in mid-50's of cancer.   Cancer Father    Other Brother        alive and well    Social History   Socioeconomic History   Marital status: Married    Spouse name: Not on file   Number of children: Not on file   Years of education: Not on file   Highest education level: Not on file  Occupational History   Not on file  Tobacco Use   Smoking status: Never   Smokeless tobacco: Never  Vaping Use   Vaping Use: Never used  Substance and Sexual Activity   Alcohol use: Not Currently    Comment: rare beer   Drug use: No   Sexual activity: Not on file  Other Topics Concern   Not on file  Social History Narrative   Lives locally with wife.  Works in [employer].  Does not routinely exercise.   Social Determinants of Health   Financial Resource Strain: Not on file  Food Insecurity: Not on file  Transportation Needs: Not on file  Physical Activity: Not on file  Stress: Not on file  Social Connections: Not on file  Intimate Partner Violence: Not on file     Current Outpatient Medications:    amiodarone (PACERONE) 200 MG tablet, Take 1 tablet (200 mg total) by mouth daily., Disp: 90 tablet, Rfl: 1   aspirin 81 MG EC tablet, Take 81 mg by mouth daily. , Disp: , Rfl:    b complex vitamins tablet, Take 1 tablet by mouth daily. , Disp: , Rfl:    Dulaglutide 1.5 MG/0.5ML SOPN, Inject into the skin once a week. , Disp: , Rfl:    losartan (COZAAR) 100 MG tablet, Take 1 tablet (100 mg total) by mouth daily., Disp: 90 tablet, Rfl: 3   metFORMIN (GLUCOPHAGE) 500 MG tablet, Take 2 tablets (1,000 mg total) by mouth 2 (two) times daily., Disp: 360 tablet, Rfl: 2   metoprolol succinate (TOPROL-XL) 50 MG 24 hr tablet, Take 1 tablet (50 mg total) by mouth  daily. Take with or immediately following a meal., Disp: 90 tablet, Rfl: 3   Omega-3 Fatty Acids (FISH OIL) 875 MG CAPS, Take 1 capsule by mouth 2 (two) times daily., Disp: , Rfl:    omeprazole (PRILOSEC) 40 MG capsule, Take 1 capsule (40 mg total) by mouth daily., Disp: 30 capsule, Rfl: 0   Pancrelipase, Lip-Prot-Amyl, (ZENPEP) 40000-126000 units CPEP, Take 40,000 Units by mouth 3 (three) times daily with meals AND 20,000 Units with snacks., Disp: 120 capsule, Rfl: 1   pramipexole (MIRAPEX) 1.5 MG tablet, Take 1 tablet (1.5 mg total) by mouth 2 (two) times daily., Disp: 180 tablet, Rfl: 3   rosuvastatin (CRESTOR) 20 MG tablet, Take 1 tablet (20 mg total) by mouth daily., Disp: 90 tablet, Rfl: 3   spironolactone (ALDACTONE) 25 MG tablet, Take 1 tablet (25 mg total) by mouth daily., Disp: 90 tablet, Rfl: 0   Torsemide 40 MG TABS, Take 40 mg by mouth daily., Disp: 90 tablet, Rfl: 0   traZODone (DESYREL) 50 MG tablet, , Disp: , Rfl:    Allergies  Allergen Reactions   Entresto [Sacubitril-Valsartan] Other (See Comments)    SVT   Lipitor [Atorvastatin] Palpitations    SVT   Pregabalin Other (See Comments)    ROS Review of Systems  Respiratory: Negative.    Cardiovascular: Negative.   Gastrointestinal:  Negative for blood in stool.  Endocrine: Negative for polyuria.  Genitourinary:  Negative for flank pain.  Psychiatric/Behavioral:  Negative for behavioral problems and confusion.      Objective:    Physical Exam Cardiovascular:     Rate and Rhythm: Regular rhythm.     Heart sounds: Normal heart sounds.  Pulmonary:     Effort: Pulmonary effort is normal.  Abdominal:     General: There is no distension.     Palpations: Abdomen is soft.    BP (!) 148/81   Pulse 77   Ht '5\' 8"'  (1.727 m)   Wt 213 lb 4.8 oz (96.8 kg)   BMI 32.43 kg/m  Wt Readings from Last 3 Encounters:  06/18/21 213 lb 4.8 oz (96.8 kg)  05/27/21 209 lb (94.8 kg)  05/15/21 214 lb (97.1 kg)     Health  Maintenance Due  Topic Date Due   URINE MICROALBUMIN  Never done   HIV Screening  Never done   Hepatitis C Screening  Never done    There are no preventive care reminders to display for this patient.  Lab Results  Component Value Date   TSH 2.53 09/30/2020  Lab Results  Component Value Date   WBC 7.7 09/30/2020   HGB 13.7 09/30/2020   HCT 40.7 09/30/2020   MCV 88.5 09/30/2020   PLT 214 09/30/2020   Lab Results  Component Value Date   NA 136 05/22/2021   K 5.0 05/22/2021   CO2 20 05/22/2021   GLUCOSE 164 (H) 05/22/2021   BUN 28 (H) 05/22/2021   CREATININE 1.37 (H) 05/22/2021   BILITOT 0.4 09/30/2020   ALKPHOS 29 (L) 01/24/2018   AST 27 09/30/2020   ALT 26 09/30/2020   PROT 6.5 09/30/2020   ALBUMIN 3.8 01/24/2018   CALCIUM 9.6 05/22/2021   ANIONGAP 13 08/23/2020   EGFR 59 (L) 05/22/2021   Lab Results  Component Value Date   CHOL 122 09/30/2020   Lab Results  Component Value Date   HDL 35 (L) 09/30/2020   Lab Results  Component Value Date   LDLCALC 67 09/30/2020   Lab Results  Component Value Date   TRIG 121 09/30/2020   Lab Results  Component Value Date   CHOLHDL 3.5 09/30/2020   Lab Results  Component Value Date   HGBA1C 7.2 05/23/2021      Assessment & Plan:   Problem List Items Addressed This Visit       Cardiovascular and Mediastinum   Hypertension    The following hypertensive lifestyle modification were recommended and discussed:  1. Limiting alcohol intake to less than 1 oz/day of ethanol:(24 oz of beer or 8 oz of wine or 2 oz of 100-proof whiskey). 2. Take baby ASA 81 mg daily. 3. Importance of regular aerobic exercise and losing weight. 4. Reduce dietary saturated fat and cholesterol intake for overall cardiovascular health. 5. Maintaining adequate dietary potassium, calcium, and magnesium intake. 6. Regular monitoring of the blood pressure. 7. Reduce sodium intake to less than 100 mmol/day (less than 2.3 gm of sodium or less than  6 gm of sodium choride)       Right ventricular dysfunction - Primary    stable        Endocrine   Diabetes mellitus type 2, uncomplicated ([place])    - The patient's blood sugar is labile on med. - The patient will continue the current treatment regimen.  - I encouraged the patient to regularly check blood sugar.  - I encouraged the patient to monitor diet. I encouraged the patient to eat low-carb and low-sugar to help prevent blood sugar spikes.  - I encouraged the patient to continue following their prescribed treatment plan for diabetes - I informed the patient to get help if blood sugar drops below 79m/dL, or if suddenly have trouble thinking clearly or breathing.  Patient was advised to buy a book on diabetes from a local bookstore or from [place]  Patient should read 2 chapters every day to keep the motivation going, this is in addition to some of the materials we provided them from the office.  There are other resources on the Internet like YouTube and wilkipedia to get an education on the diabetes      Relevant Medications   metFORMIN (GLUCOPHAGE) 500 MG tablet     Other   Restless leg syndrome    Continues to have problem       Meds ordered this encounter  Medications   metFORMIN (GLUCOPHAGE) 500 MG tablet    Sig: Take 2 tablets (1,000 mg total) by mouth 2 (two) times daily.    Dispense:  360 tablet    Refill:  2  Follow-up: No follow-ups on file.    [name], MD

## 2021-06-18 NOTE — Assessment & Plan Note (Signed)
The following hypertensive lifestyle modification were recommended and discussed:  1. Limiting alcohol intake to less than 1 oz/day of ethanol:(24 oz of beer or 8 oz of wine or 2 oz of 100-proof whiskey). 2. Take baby ASA 81 mg daily. 3. Importance of regular aerobic exercise and losing weight. 4. Reduce dietary saturated fat and cholesterol intake for overall cardiovascular health. 5. Maintaining adequate dietary potassium, calcium, and magnesium intake. 6. Regular monitoring of the blood pressure. 7. Reduce sodium intake to less than 100 mmol/day (less than 2.3 gm of sodium or less than 6 gm of sodium choride)  

## 2021-06-18 NOTE — Assessment & Plan Note (Signed)
stable °

## 2021-06-18 NOTE — Assessment & Plan Note (Signed)
Continues to have problem

## 2021-06-18 NOTE — Assessment & Plan Note (Signed)

## 2021-06-19 LAB — BASIC METABOLIC PANEL
BUN/Creatinine Ratio: 24 — ABNORMAL HIGH (ref 9–20)
BUN: 28 mg/dL — ABNORMAL HIGH (ref 6–24)
CO2: 24 mmol/L (ref 20–29)
Calcium: 9.5 mg/dL (ref 8.7–10.2)
Chloride: 98 mmol/L (ref 96–106)
Creatinine, Ser: 1.19 mg/dL (ref 0.76–1.27)
Glucose: 203 mg/dL — ABNORMAL HIGH (ref 70–99)
Potassium: 5 mmol/L (ref 3.5–5.2)
Sodium: 139 mmol/L (ref 134–144)
eGFR: 70 mL/min/{1.73_m2} (ref >59)

## 2021-06-19 LAB — MICROALBUMIN, URINE: Microalb, Ur: <0.2 mg/dL

## 2021-07-01 ENCOUNTER — Ambulatory Visit: Admission: RE | Admit: 2021-07-01 | Payer: 59 | Source: Ambulatory Visit

## 2021-07-03 ENCOUNTER — Ambulatory Visit (INDEPENDENT_AMBULATORY_CARE_PROVIDER_SITE_OTHER): Payer: 59 | Admitting: Cardiology

## 2021-07-03 ENCOUNTER — Encounter: Payer: Self-pay | Admitting: Cardiology

## 2021-07-03 ENCOUNTER — Other Ambulatory Visit: Payer: Self-pay

## 2021-07-03 VITALS — BP 116/62 | HR 78 | Ht 68.0 in | Wt 211.0 lb

## 2021-07-03 DIAGNOSIS — I471 Supraventricular tachycardia: Secondary | ICD-10-CM | POA: Diagnosis not present

## 2021-07-03 DIAGNOSIS — I10 Essential (primary) hypertension: Secondary | ICD-10-CM | POA: Diagnosis not present

## 2021-07-03 DIAGNOSIS — Z951 Presence of aortocoronary bypass graft: Secondary | ICD-10-CM

## 2021-07-03 DIAGNOSIS — I5020 Unspecified systolic (congestive) heart failure: Secondary | ICD-10-CM | POA: Diagnosis not present

## 2021-07-03 MED ORDER — TORSEMIDE 20 MG PO TABS: ORAL_TABLET | ORAL | 3 refills | Status: DC

## 2021-07-03 NOTE — Progress Notes (Signed)
Cardiology Office Note:    Date:  07/03/2021   ID:  Ricardo Brown, DOB 15-May-1961, MRN [id number]  PCP:  [name], MD  Cardiologist:  [name], MD  Electrophysiologist:  None   Referring MD: [name], MD   Chief Complaint  Patient presents with   Other    6 week follow up -- Patient c.o swelling in feet. Meds reviewed verbally with patient.      History of Present Illness:    Ricardo Brown is a 60 y.o. male with a hx of SVT on amiodarone, CAD/CABG x3 in 2019, HFrEF EF 40-45%, hypertension, hyperlipidemia, diabetes, who presents for follow-up.    He is being seen for ischemic cardiomyopathy and medication titration.  Had leg edema and shortness of breath at previous visit, Lasix was stopped, torsemide was started.  Tolerating current doses of Toprol-XL, Aldactone, losartan.  Delene Loll was previously tried but patient did not tolerate.  He went into SVT.  His leg swelling is improved but still present.  Still has shortness of breath with exertion.  Blood pressures are adequately controlled.  Recently started on pancreatic enzymes for pancreatic insufficiency.   Prior notes Echo 10/2020 EF 40 to 12%, grade 2 diastolic dysfunction. Patient was seen in the [facility] in 06/ 2019 due to nausea, weakness, myalgias.  EKG at the time showed inferior ST elevation with reciprocal changes in the anterior leads and small inferior Q waves.  Patient was deemed late presenting STEMI.   Left heart cath was performed, briefly showed occluded distal RCA, 90% mid LAD, 90% proximal left circumflex.  Due to patient being a diabetic, and left heart cath showing diffuse three-vessel disease, CABG was recommended.  He underwent coronary artery bypass grafting x3 in July 2019 at [facility]  Delene Loll previously tried on patient, he did not tolerate.  He developed feeling of unwell, went into SVT and was taken to the emergency room.  Patient with history of SVT,  who was seen in the emergency room and adenosine given x2 and placed on amiodarone  Echo on 09/2019 showed mild to moderately reduced ejection fraction, EF 40 to 45%.  Past Medical History:  Diagnosis Date   CHF (congestive heart failure) (HCC)    Coronary artery disease    Diabetes mellitus without complication ([place])    a. Dx ~ 2000.   Heart palpitations    a. Pt reports prior nl echo's and stress tests. Last stress test w/in past 2 yrs - PCP.   High cholesterol    Hypertension    Myocardial infarction [facility])     Past Surgical History:  Procedure Laterality Date   CARDIAC CATHETERIZATION     CARDIAC VALVE REPLACEMENT     Mitral Valve Repair   COLONOSCOPY WITH PROPOFOL N/A 06/07/2019   Procedure: COLONOSCOPY WITH PROPOFOL;  Surgeon: [name], MD;  Location: [facility] ENDOSCOPY;  Service: Endoscopy;  Laterality: N/A;   COLONOSCOPY WITH PROPOFOL N/A 02/07/2020   Procedure: COLONOSCOPY WITH PROPOFOL;  Surgeon: [name], MD;  Location: [facility] ENDOSCOPY;  Service: Endoscopy;  Laterality: N/A;   COLONOSCOPY WITH PROPOFOL N/A 12/11/2020   Procedure: COLONOSCOPY WITH PROPOFOL;  Surgeon: [name], MD;  Location: [facility] ENDOSCOPY;  Service: Endoscopy;  Laterality: N/A;   CORONARY ANGIOPLASTY     CORONARY ARTERY BYPASS GRAFT     x 3   01/2018   ESOPHAGOGASTRODUODENOSCOPY N/A 02/07/2020   Procedure: ESOPHAGOGASTRODUODENOSCOPY (EGD);  Surgeon: [name], MD;  Location: [facility]  ENDOSCOPY;  Service: Endoscopy;  Laterality: N/A;   LEFT HEART CATH AND CORONARY ANGIOGRAPHY N/A 01/24/2018   Procedure: LEFT HEART CATH AND CORONARY ANGIOGRAPHY;  Surgeon: [name], MD;  Location: [place] CV LAB;  Service: Cardiovascular;  Laterality: N/A;   TONSILLECTOMY      Current Medications: Current Meds  Medication Sig   amiodarone (PACERONE) 200 MG tablet Take 1 tablet (200 mg total) by mouth daily.   aspirin 81 MG EC tablet Take 81 mg by mouth daily.    b complex  vitamins tablet Take 1 tablet by mouth daily.    Dulaglutide 1.5 MG/0.5ML SOPN Inject into the skin once a week.    empagliflozin (JARDIANCE) 25 MG TABS tablet Take 25 mg by mouth daily.   losartan (COZAAR) 100 MG tablet Take 1 tablet (100 mg total) by mouth daily.   metFORMIN (GLUCOPHAGE) 500 MG tablet Take 2 tablets (1,000 mg total) by mouth 2 (two) times daily.   metoprolol succinate (TOPROL-XL) 50 MG 24 hr tablet Take 1 tablet (50 mg total) by mouth daily. Take with or immediately following a meal.   Omega-3 Fatty Acids (FISH OIL) 875 MG CAPS Take 1 capsule by mouth 2 (two) times daily.   Pancrelipase, Lip-Prot-Amyl, (ZENPEP) 40000-126000 units CPEP Take 40,000 Units by mouth 3 (three) times daily with meals AND 20,000 Units with snacks.   pramipexole (MIRAPEX) 1.5 MG tablet Take 1 tablet (1.5 mg total) by mouth 2 (two) times daily.   spironolactone (ALDACTONE) 25 MG tablet Take 1 tablet (25 mg total) by mouth daily.   torsemide (DEMADEX) 20 MG tablet Take 40 MG (2 tablets) in the AM, Take 20 MG (1 tablet) in the PM.   traZODone (DESYREL) 50 MG tablet    [DISCONTINUED] Torsemide 40 MG TABS Take 40 mg by mouth daily.     Allergies:   Entresto [sacubitril-valsartan], Lipitor [atorvastatin], and Pregabalin   Social History   Socioeconomic History   Marital status: Married    Spouse name: Not on file   Number of children: Not on file   Years of education: Not on file   Highest education level: Not on file  Occupational History   Not on file  Tobacco Use   Smoking status: Never   Smokeless tobacco: Never  Vaping Use   Vaping Use: Never used  Substance and Sexual Activity   Alcohol use: Not Currently    Comment: rare beer   Drug use: No   Sexual activity: Not on file  Other Topics Concern   Not on file  Social History Narrative   Lives locally with wife.  Works in [employer].  Does not routinely exercise.   Social Determinants of Health   Financial Resource Strain: Not on  file  Food Insecurity: Not on file  Transportation Needs: Not on file  Physical Activity: Not on file  Stress: Not on file  Social Connections: Not on file     Family History: The patient's family history includes CAD in his father; Cancer in his father and mother; Other in his brother.  ROS:   Please see the history of present illness.     All other systems reviewed and are negative.  EKGs/Labs/Other Studies Reviewed:    The following studies were reviewed today:   EKG:  EKG not ordered today.   Recent Labs: 09/30/2020: ALT 26; Hemoglobin 13.7; Platelets 214; TSH 2.53 06/18/2021: BUN 28; Creatinine, Ser 1.19; Potassium 5.0; Sodium 139  Recent Lipid Panel  Component Value Date/Time   CHOL 122 09/30/2020 1114   TRIG 121 09/30/2020 1114   HDL 35 (L) 09/30/2020 1114   CHOLHDL 3.5 09/30/2020 1114   VLDL 24 01/24/2018 1459   LDLCALC 67 09/30/2020 1114    Physical Exam:    VS:  BP 116/62 (BP Location: Left Arm, Patient Position: Sitting, Cuff Size: Normal)   Pulse 78   Ht 5\' 8"  (1.727 m)   Wt 211 lb (95.7 kg)   SpO2 98%   BMI 32.08 kg/m     Wt Readings from Last 3 Encounters:  07/03/21 211 lb (95.7 kg)  06/18/21 213 lb 4.8 oz (96.8 kg)  05/27/21 209 lb (94.8 kg)     GEN:  Well nourished, well developed in no acute distress HEENT: Normal NECK: No JVD; No carotid bruits CARDIAC: RRR, no murmurs, rubs, gallops RESPIRATORY:  Clear to auscultation without rales, wheezing or rhonchi  ABDOMEN: Soft, non-tender, non-distended MUSCULOSKELETAL:  1+ edema; No deformity  SKIN: Warm and dry NEUROLOGIC:  Alert and oriented x 3 PSYCHIATRIC:  Normal affect   ASSESSMENT:    1. Hx of CABG   2. HFrEF (heart failure with reduced ejection fraction) ([place])   3. Primary hypertension   4. SVT (supraventricular tachycardia) (HCC)     PLAN:    In order of problems listed above:  CAD status post CABG x3 in 2019. Denies chest pain.  Continue aspirin and Crestor. ICM, echo  10/2020 EF 40 to 45%.  Describes NYHA class II-Brown symptoms.  still volume overloaded with leg edema.  Increase torsemide to 60 mg daily.  Check BMP in 1 week.  Continue Aldactone 25 mg daily, losartan 100 mg daily, Toprol-XL 50 mg daily.  Did not tolerate Entresto in the past, went into SVT. Hypertension, BP controlled.    Continue losartan 100 mg daily, Toprol-XL, Aldactone 25 mg daily, torsemide.   History of SVT.  Continue amiodarone 200 mg daily.  Check TSH and free T4.  Follow-up in 6 weeks.  Total encounter time 40 minutes  Greater than 50% was spent in counseling and coordination of care with the patient   Medication Adjustments/Labs and Tests Ordered: Current medicines are reviewed at length with the patient today.  Concerns regarding medicines are outlined above.  Orders Placed This Encounter  Procedures   Basic metabolic panel   TSH   T4, free     Meds ordered this encounter  Medications   torsemide (DEMADEX) 20 MG tablet    Sig: Take 40 MG (2 tablets) in the AM, Take 20 MG (1 tablet) in the PM.    Dispense:  180 tablet    Refill:  3      Patient Instructions  Medication Instructions:   Your physician has recommended you make the following change in your medication:    INCREASE your Torsemide to 60 MG daily. Take 40 MG in the AM, and take 20 MG in the PM.   *If you need a refill on your cardiac medications before your next appointment, please call your pharmacy*   Lab Work:  Your physician recommends that you return for lab work (BMP, TSH, Free T4)) in: 1 WEEK    Please return to our office on_____________________at______________am/pm    Testing/Procedures: None Ordered   Follow-Up: At [facility], you and your health needs are our priority.  As part of our continuing mission to provide you with exceptional heart care, we have created designated Provider Care Teams.  These Care Teams  include your primary Cardiologist (physician) and Advanced  Practice Providers (APPs -  Physician Assistants and Nurse Practitioners) who all work together to provide you with the care you need, when you need it.  We recommend signing up for the patient portal called "MyChart".  Sign up information is provided on this After Visit Summary.  MyChart is used to connect with patients for Virtual Visits (Telemedicine).  Patients are able to view lab/test results, encounter notes, upcoming appointments, etc.  Non-urgent messages can be sent to your provider as well.   To learn more about what you can do with MyChart, go to [email or web address].    Your next appointment:    6-8 weeks   The format for your next appointment:   In Person  Provider:    Dr. Rosiland Oz  Other Instructions    Signed, [name], MD  07/03/2021 12:23 PM    [place]

## 2021-07-03 NOTE — Patient Instructions (Signed)
Medication Instructions:   Your physician has recommended you make the following change in your medication:    INCREASE your Torsemide to 60 MG daily. Take 40 MG in the AM, and take 20 MG in the PM.   *If you need a refill on your cardiac medications before your next appointment, please call your pharmacy*   Lab Work:  Your physician recommends that you return for lab work (BMP, TSH, Free T4)) in: 1 WEEK    Please return to our office on_____________________at______________am/pm    Testing/Procedures: None Ordered   Follow-Up: At [facility], you and your health needs are our priority.  As part of our continuing mission to provide you with exceptional heart care, we have created designated Provider Care Teams.  These Care Teams include your primary Cardiologist (physician) and Advanced Practice Providers (APPs -  Physician Assistants and Nurse Practitioners) who all work together to provide you with the care you need, when you need it.  We recommend signing up for the patient portal called "MyChart".  Sign up information is provided on this After Visit Summary.  MyChart is used to connect with patients for Virtual Visits (Telemedicine).  Patients are able to view lab/test results, encounter notes, upcoming appointments, etc.  Non-urgent messages can be sent to your provider as well.   To learn more about what you can do with MyChart, go to [email or web address].    Your next appointment:    6-8 weeks   The format for your next appointment:   In Person  Provider:    Dr. Rosiland Oz  Other Instructions

## 2021-07-10 ENCOUNTER — Ambulatory Visit (INDEPENDENT_AMBULATORY_CARE_PROVIDER_SITE_OTHER): Payer: 59 | Admitting: Gastroenterology

## 2021-07-10 ENCOUNTER — Encounter: Payer: Self-pay | Admitting: Gastroenterology

## 2021-07-10 ENCOUNTER — Other Ambulatory Visit: Payer: 59

## 2021-07-10 VITALS — BP 97/62 | HR 76 | Temp 98.1°F | Wt 206.0 lb

## 2021-07-10 DIAGNOSIS — K8689 Other specified diseases of pancreas: Secondary | ICD-10-CM | POA: Diagnosis not present

## 2021-07-10 MED ORDER — ZENPEP 40000-126000 UNITS PO CPEP: ORAL_CAPSULE | ORAL | 1 refills | Status: DC

## 2021-07-10 NOTE — Progress Notes (Signed)
[name], MD [address]  [place], [place] [address]  Main: [phone]  Fax: [phone]   Primary Care Physician: [name], MD   Chief Complaint  Patient presents with   Follow-up    Pt reports improvement with loose stools, not as often... denies any new concerns    HPI: Ricardo Brown is a 60 y.o. male here for follow-up of pancreatic insufficiency.  Has been taking Zenpep and reports improved symptoms since then.  Is now reporting that he is not having any fecal incontinence but still has days where he has to rush to the bathroom and have a loose bowel movements but they are not as frequent as before.  No blood in stool.  Does report some abdominal bloating although improved from before.  Denies any heartburn.  Was previously reporting foul-smelling burps which have also resolved. Ran out of his PPI and is still not having reflux symptoms.  Previous history: Previously seen in 2020 for abdominal bloating and had reported alternating constipation and diarrhea at that time.  Random colon Biopsies in 2020 were negative for microscopic colitis.   Most recent colonoscopy in May 2022 with 5 subcentimeter polyps removed.  Tattoo noted in the hepatic flexure site did not show any residual polyp tissue.  Small lipoma seen as well as hepatic flexure.  Pathology showed tubular adenoma polyps and lipoma biopsies were consistent with lipoma.   Most recent upper endoscopy in July 2021 -short segment salmon-colored mucosa seen in the distal esophagus with biopsies showing reflux gastroesophagitis.  Mucosal changes in the duodenum biopsied.  This showed gastric heterotopia.   Liver enzymes reassuring on recent CMP  ROS: All ROS reviewed and negative except as per HPI   Past Medical History:  Diagnosis Date   CHF (congestive heart failure) (HCC)    Coronary artery disease    Diabetes mellitus without complication ([place])    a. Dx ~ 2000.   Heart  palpitations    a. Pt reports prior nl echo's and stress tests. Last stress test w/in past 2 yrs - PCP.   High cholesterol    Hypertension    Myocardial infarction [facility])     Past Surgical History:  Procedure Laterality Date   CARDIAC CATHETERIZATION     CARDIAC VALVE REPLACEMENT     Mitral Valve Repair   COLONOSCOPY WITH PROPOFOL N/A 06/07/2019   Procedure: COLONOSCOPY WITH PROPOFOL;  Surgeon: [name], MD;  Location: [facility] ENDOSCOPY;  Service: Endoscopy;  Laterality: N/A;   COLONOSCOPY WITH PROPOFOL N/A 02/07/2020   Procedure: COLONOSCOPY WITH PROPOFOL;  Surgeon: [name], MD;  Location: [facility] ENDOSCOPY;  Service: Endoscopy;  Laterality: N/A;   COLONOSCOPY WITH PROPOFOL N/A 12/11/2020   Procedure: COLONOSCOPY WITH PROPOFOL;  Surgeon: [name], MD;  Location: [facility] ENDOSCOPY;  Service: Endoscopy;  Laterality: N/A;   CORONARY ANGIOPLASTY     CORONARY ARTERY BYPASS GRAFT     x 3   01/2018   ESOPHAGOGASTRODUODENOSCOPY N/A 02/07/2020   Procedure: ESOPHAGOGASTRODUODENOSCOPY (EGD);  Surgeon: [name], MD;  [facility] ENDOSCOPY;  Service: Endoscopy;  Laterality: N/A;   LEFT HEART CATH AND CORONARY ANGIOGRAPHY N/A 01/24/2018   Procedure: LEFT HEART CATH AND CORONARY ANGIOGRAPHY;  Surgeon: [name], MD;  Location: [place] CV LAB;  Service: Cardiovascular;  Laterality: N/A;   TONSILLECTOMY      Prior to Admission medications   Medication Sig Start Date End Date Taking? Authorizing Provider  amiodarone (PACERONE) 200 MG tablet Take 1 tablet (200 mg total) by mouth daily. 05/21/21  Yes Masoud, Viann Shove, MD  aspirin 81 MG EC tablet Take 81 mg by mouth daily.    Yes [provider]  b complex vitamins tablet Take 1 tablet by mouth daily.    Yes [provider]  Dulaglutide 1.5 MG/0.5ML SOPN Inject into the skin once a week.  03/30/18  Yes [provider]  empagliflozin (JARDIANCE) 25 MG TABS tablet Take 25 mg by mouth  daily.   Yes [provider]  metFORMIN (GLUCOPHAGE) 500 MG tablet Take 2 tablets (1,000 mg total) by mouth 2 (two) times daily. 06/18/21  Yes Masoud, Viann Shove, MD  metoprolol succinate (TOPROL-XL) 50 MG 24 hr tablet Take 1 tablet (50 mg total) by mouth daily. Take with or immediately following a meal. 05/27/21  Yes Masoud, Viann Shove, MD  Omega-3 Fatty Acids (FISH OIL) 875 MG CAPS Take 1 capsule by mouth 2 (two) times daily.   Yes [provider]  pramipexole (MIRAPEX) 1.5 MG tablet Take 1 tablet (1.5 mg total) by mouth 2 (two) times daily. 05/21/21  Yes Masoud, Viann Shove, MD  spironolactone (ALDACTONE) 25 MG tablet Take 1 tablet (25 mg total) by mouth daily. 05/12/21  Yes Agbor-Etang, Aaron Edelman, MD  torsemide (DEMADEX) 20 MG tablet Take 40 MG (2 tablets) in the AM, Take 20 MG (1 tablet) in the PM. 07/03/21  Yes Agbor-Etang, Aaron Edelman, MD  traZODone (DESYREL) 50 MG tablet  10/01/19  Yes [provider]  losartan (COZAAR) 100 MG tablet Take 1 tablet (100 mg total) by mouth daily. 09/02/20 07/03/21  [name], MD  Pancrelipase, Lip-Prot-Amyl, (ZENPEP) 40000-126000 units CPEP Take 80,000 Units by mouth 3 (three) times daily with meals AND 20,000 Units with snacks. 07/10/21 10/08/21  [name], MD  rosuvastatin (CRESTOR) 20 MG tablet Take 1 tablet (20 mg total) by mouth daily. 09/02/20 12/01/20  [name], MD    Family History  Problem Relation Age of Onset   Cancer Mother        died @ 44   CAD Father        First MI @ 49. S/p heart transplant. Died in mid-50's of cancer.   Cancer Father    Other Brother        alive and well     Social History   Tobacco Use   Smoking status: Never   Smokeless tobacco: Never  Vaping Use   Vaping Use: Never used  Substance Use Topics   Alcohol use: Not Currently    Comment: rare beer   Drug use: No    Allergies as of 07/10/2021 - Review Complete 07/10/2021  Allergen Reaction Noted   Entresto [sacubitril-valsartan] Other  (See Comments) 06/07/2019   Lipitor [atorvastatin] Palpitations 04/23/2015   Pregabalin Other (See Comments) 10/02/2019    Physical Examination:  Constitutional: General:   Alert,  Well-developed, well-nourished, pleasant and cooperative in NAD BP 97/62   Pulse 76   Temp 98.1 F (36.7 C) (Oral)   Wt 206 lb (93.4 kg)   BMI 31.32 kg/m   Respiratory: Normal respiratory effort  Gastrointestinal:  Soft, non-tender and non-distended without masses, hepatosplenomegaly or hernias noted.  No guarding or rebound tenderness.     Cardiac: No clubbing or edema.  No cyanosis. Normal posterior tibial pedal pulses noted.  Psych:  Alert and cooperative. Normal mood and affect.  Musculoskeletal:  Normal gait. Head normocephalic, atraumatic. Symmetrical without gross deformities. 5/5 Lower extremity  strength bilaterally.  Skin: Warm. Intact without significant lesions or rashes. No jaundice.  Neck: Supple, trachea midline  Lymph: No cervical lymphadenopathy  Psych:  Alert and oriented x3, Alert and cooperative. Normal mood and affect.  Labs: CMP     Component Value Date/Time   NA 139 06/18/2021 1045   K 5.0 06/18/2021 1045   CL 98 06/18/2021 1045   CO2 24 06/18/2021 1045   GLUCOSE 203 (H) 06/18/2021 1045   GLUCOSE 106 (H) 09/30/2020 1114   BUN 28 (H) 06/18/2021 1045   CREATININE 1.19 06/18/2021 1045   CREATININE 1.31 09/30/2020 1114   CALCIUM 9.5 06/18/2021 1045   PROT 6.5 09/30/2020 1114   ALBUMIN 3.8 01/24/2018 1108   AST 27 09/30/2020 1114   ALT 26 09/30/2020 1114   ALKPHOS 29 (L) 01/24/2018 1108   BILITOT 0.4 09/30/2020 1114   GFRNONAA 59 (L) 09/30/2020 1114   GFRAA 69 09/30/2020 1114   Lab Results  Component Value Date   WBC 7.7 09/30/2020   HGB 13.7 09/30/2020   HCT 40.7 09/30/2020   MCV 88.5 09/30/2020   PLT 214 09/30/2020    Imaging Studies:   Assessment and Plan:   Ricardo Brown is a 60 y.o. y/o male here for follow-up and is reporting improved  pancreatic insufficiency symptoms on Zenpep  Increase Zenpep to 2 pills with every meal, proper use with first bite recommended and 1 pill with every snack.  Patient currently states he does not eat much snacks and therefore is usually only having Zenpep 3 times a day  CT scan was ordered to evaluate the pancreas but prior authorization is pending  Given that he is not having any further heartburn symptoms, and reflux symptoms have overall resolved, no indication to refill PPI at this time.  Antireflux lifestyle measures discussed.  If symptoms recur patient advised to call us and we can consider H2 RA or short-term PPI therapy  If symptoms are occasional such as once or twice a week, patient can also use antacids over-the-counter  Clinic follow-up in 3 to 6 months or earlier if needed  Dr [name]

## 2021-07-11 ENCOUNTER — Other Ambulatory Visit (INDEPENDENT_AMBULATORY_CARE_PROVIDER_SITE_OTHER): Payer: 59

## 2021-07-11 ENCOUNTER — Other Ambulatory Visit: Payer: Self-pay

## 2021-07-11 DIAGNOSIS — I5020 Unspecified systolic (congestive) heart failure: Secondary | ICD-10-CM | POA: Diagnosis not present

## 2021-07-11 DIAGNOSIS — I471 Supraventricular tachycardia: Secondary | ICD-10-CM

## 2021-07-12 LAB — BASIC METABOLIC PANEL
BUN/Creatinine Ratio: 24 — ABNORMAL HIGH (ref 9–20)
BUN: 44 mg/dL — ABNORMAL HIGH (ref 6–24)
CO2: 24 mmol/L (ref 20–29)
Calcium: 9.1 mg/dL (ref 8.7–10.2)
Chloride: 97 mmol/L (ref 96–106)
Creatinine, Ser: 1.86 mg/dL — ABNORMAL HIGH (ref 0.76–1.27)
Glucose: 146 mg/dL — ABNORMAL HIGH (ref 70–99)
Potassium: 4.3 mmol/L (ref 3.5–5.2)
Sodium: 138 mmol/L (ref 134–144)
eGFR: 41 mL/min/{1.73_m2} — ABNORMAL LOW (ref >59)

## 2021-07-12 LAB — TSH: TSH: 2.78 u[IU]/mL (ref 0.450–4.500)

## 2021-07-12 LAB — T4, FREE: Free T4: 1.45 ng/dL (ref 0.82–1.77)

## 2021-07-14 ENCOUNTER — Telehealth: Payer: Self-pay | Admitting: Emergency Medicine

## 2021-07-14 ENCOUNTER — Other Ambulatory Visit: Payer: Self-pay | Admitting: *Deleted

## 2021-07-14 DIAGNOSIS — I5020 Unspecified systolic (congestive) heart failure: Secondary | ICD-10-CM

## 2021-07-14 MED ORDER — GABAPENTIN 50 MG PO TABS: 50.0000 mg | ORAL_TABLET | Freq: Two times a day (BID) | ORAL | 1 refills | Status: DC | PRN

## 2021-07-14 MED ORDER — TORSEMIDE 20 MG PO TABS: 40.0000 mg | ORAL_TABLET | Freq: Every day | ORAL | 3 refills | Status: DC

## 2021-07-14 NOTE — Telephone Encounter (Signed)
-----   Message from [name], MD sent at 07/14/2021  8:12 AM EST ----- Creatinine elevated.  Reduce torsemide to 40 mg daily.  Continue low-salt diet.  Repeat BMP in 1 week.  Thyroid function is normal.

## 2021-07-14 NOTE — Telephone Encounter (Signed)
Patient called to go over lab results, medication instructions, and to schedule repeat BMP in 1 week.   No questions or concerns. Patient expressed appreciation for call.

## 2021-07-17 ENCOUNTER — Telehealth: Payer: Self-pay

## 2021-07-17 MED ORDER — ZENPEP 40000-126000 UNITS PO CPEP: ORAL_CAPSULE | ORAL | 1 refills | Status: DC

## 2021-07-17 NOTE — Addendum Note (Signed)
Addended by: Lurlean Nanny on: 07/17/2021 03:22 PM   Modules accepted: Orders

## 2021-07-17 NOTE — Telephone Encounter (Signed)
Spoke with [facility] and they stated that Rx was rejected as it exceeded the money amount allowed for the Rx  It has been put through for review and awaiting fax responding to the request

## 2021-07-18 ENCOUNTER — Ambulatory Visit: Payer: 59 | Admitting: Internal Medicine

## 2021-07-21 ENCOUNTER — Other Ambulatory Visit: Payer: 59

## 2021-07-23 ENCOUNTER — Other Ambulatory Visit: Payer: Self-pay

## 2021-07-23 ENCOUNTER — Other Ambulatory Visit (INDEPENDENT_AMBULATORY_CARE_PROVIDER_SITE_OTHER): Payer: 59

## 2021-07-23 DIAGNOSIS — I5020 Unspecified systolic (congestive) heart failure: Secondary | ICD-10-CM | POA: Diagnosis not present

## 2021-07-24 ENCOUNTER — Telehealth: Payer: Self-pay | Admitting: Cardiology

## 2021-07-24 DIAGNOSIS — E875 Hyperkalemia: Secondary | ICD-10-CM

## 2021-07-24 LAB — BASIC METABOLIC PANEL
BUN/Creatinine Ratio: 18 (ref 9–20)
BUN: 25 mg/dL — ABNORMAL HIGH (ref 6–24)
CO2: 26 mmol/L (ref 20–29)
Calcium: 9.8 mg/dL (ref 8.7–10.2)
Chloride: 100 mmol/L (ref 96–106)
Creatinine, Ser: 1.42 mg/dL — ABNORMAL HIGH (ref 0.76–1.27)
Glucose: 131 mg/dL — ABNORMAL HIGH (ref 70–99)
Potassium: 5.6 mmol/L — ABNORMAL HIGH (ref 3.5–5.2)
Sodium: 140 mmol/L (ref 134–144)
eGFR: 57 mL/min/{1.73_m2} — ABNORMAL LOW (ref >59)

## 2021-07-24 NOTE — Telephone Encounter (Signed)
Secure chat message sent to Dr. Fletcher Anon (DOD) to please review. Message received from Dr. Fletcher Anon with recommendations to: 1) Repeat the BMP STAT (at the hospital lab) to follow up on the elevated K+ level   Attempted to contact the patient. No answer- Unable to leave a message as the voice mail box is full.    Will attempt to contact the patient a little later today.

## 2021-07-24 NOTE — Telephone Encounter (Signed)
Emily Filbert, RN  07/24/2021  9:47 AM EST     07/03/21- torsemide increased to 40 mg in the AM & 20 mg in the PM.   07/11/21- repeat BMP showed creatinine increased from 1.19 (11/16) to 1.86 - advised to decrease torsemide to 40 mg QD and repeat a BMP in 1 week   07/23/21- repeat BMP shows K+ elevated at 5.6/ creatinine improved to 1.42 - No potassium supplements noted in Epic, but the patient is taking spironolactone 25 mg QD   Will review with DOD.

## 2021-07-24 NOTE — Telephone Encounter (Signed)
I called and spoke with the patient regarding his lab results and elevated potassium reading on the panel that was done on 12/21. He voices understanding of these results and is advised to make no change to his medications at this time, but he is aware we need to repeat this panel at the [facility] today if possible, but tomorrow at the latest.  Per the patient, he can come tomorrow to have this lab work repeated.  He voices understanding of where to come to have this done and advised he will be there early in the morning.  BMP order placed.  The patient is aware we will follow up with him tomorrow once the lab results.

## 2021-07-25 ENCOUNTER — Other Ambulatory Visit
Admission: RE | Admit: 2021-07-25 | Discharge: 2021-07-25 | Disposition: A | Discharge status: Home / Self Care | Payer: 59 | Attending: Cardiology | Admitting: Cardiology

## 2021-07-25 DIAGNOSIS — E875 Hyperkalemia: Secondary | ICD-10-CM | POA: Diagnosis present

## 2021-07-25 LAB — BASIC METABOLIC PANEL
Anion gap: 8 (ref 5–15)
BUN: 31 mg/dL — ABNORMAL HIGH (ref 6–20)
CO2: 26 mmol/L (ref 22–32)
Calcium: 9.1 mg/dL (ref 8.9–10.3)
Chloride: 102 mmol/L (ref 98–111)
Creatinine, Ser: 1.32 mg/dL — ABNORMAL HIGH (ref 0.61–1.24)
GFR, Estimated: >60 mL/min (ref >60)
Glucose, Bld: 170 mg/dL — ABNORMAL HIGH (ref 70–99)
Potassium: 4 mmol/L (ref 3.5–5.1)
Sodium: 136 mmol/L (ref 135–145)

## 2021-07-25 NOTE — Telephone Encounter (Signed)
The patient had his BMP repeated today. K+ is 4.0.  Lab reviewed by Dr. Garen Lah.  [name], MD  07/25/2021  9:58 AM EST     Cr stable. Continue medications as prescribed   I called and spoke with the patient regarding his lab results.  He voices understanding and is agreeable.

## 2021-08-05 ENCOUNTER — Other Ambulatory Visit: Payer: Self-pay

## 2021-08-05 ENCOUNTER — Ambulatory Visit (INDEPENDENT_AMBULATORY_CARE_PROVIDER_SITE_OTHER): Payer: 59 | Admitting: Internal Medicine

## 2021-08-05 ENCOUNTER — Encounter: Payer: Self-pay | Admitting: Internal Medicine

## 2021-08-05 VITALS — BP 132/72 | HR 72 | Ht 68.0 in | Wt 204.4 lb

## 2021-08-05 DIAGNOSIS — I25810 Atherosclerosis of coronary artery bypass graft(s) without angina pectoris: Secondary | ICD-10-CM

## 2021-08-05 DIAGNOSIS — R42 Dizziness and giddiness: Secondary | ICD-10-CM

## 2021-08-05 DIAGNOSIS — E7800 Pure hypercholesterolemia, unspecified: Secondary | ICD-10-CM

## 2021-08-05 DIAGNOSIS — I10 Essential (primary) hypertension: Secondary | ICD-10-CM | POA: Diagnosis not present

## 2021-08-05 DIAGNOSIS — E8881 Metabolic syndrome: Secondary | ICD-10-CM

## 2021-08-05 DIAGNOSIS — E119 Type 2 diabetes mellitus without complications: Secondary | ICD-10-CM | POA: Diagnosis not present

## 2021-08-05 LAB — GLUCOSE, POCT (MANUAL RESULT ENTRY): POC Glucose: 170 mg/dl — AB (ref 70–99)

## 2021-08-05 MED ORDER — GABAPENTIN 50 MG PO TABS: 50.0000 mg | ORAL_TABLET | Freq: Two times a day (BID) | ORAL | 1 refills | Status: DC | PRN

## 2021-08-05 MED ORDER — GABAPENTIN 100 MG PO CAPS: 100.0000 mg | ORAL_CAPSULE | Freq: Two times a day (BID) | ORAL | 1 refills | Status: DC

## 2021-08-05 NOTE — Addendum Note (Signed)
Addended by: Alois Cliche on: 08/05/2021 02:25 PM   Modules accepted: Orders

## 2021-08-05 NOTE — Assessment & Plan Note (Signed)

## 2021-08-05 NOTE — Progress Notes (Signed)
Established Patient Office Visit  Subjective:  Patient ID: Ricardo Brown, male    DOB: [birth year]  Age: 61 y.o. MRN: [id number]  CC:  Chief Complaint  Patient presents with   Diabetes   Hypertension    1 month follow up    Diabetes He has type 2 diabetes mellitus. His disease course has been fluctuating. Hypoglycemia symptoms include dizziness, headaches and sweats. Associated symptoms include blurred vision, fatigue and weakness. Pertinent negatives for diabetes include no chest pain and no foot paresthesias. Pertinent negatives for hypoglycemia complications include no blackouts. Diabetic complications include autonomic neuropathy. Risk factors for coronary artery disease include diabetes mellitus and hypertension. Current diabetic treatment includes oral agent (triple therapy).  Hypertension Associated symptoms include blurred vision, headaches and sweats. Pertinent negatives include no chest pain.  Ricardo Brown presents for  diabetes and bp check  Past Medical History:  Diagnosis Date   CHF (congestive heart failure) ([place])    Coronary artery disease    Diabetes mellitus without complication ([place])    a. Dx ~ 2000.   Heart palpitations    a. Pt reports prior nl echo's and stress tests. Last stress test w/in past 2 yrs - PCP.   High cholesterol    Hypertension    Myocardial infarction [facility])     Past Surgical History:  Procedure Laterality Date   CARDIAC CATHETERIZATION     CARDIAC VALVE REPLACEMENT     Mitral Valve Repair   COLONOSCOPY WITH PROPOFOL N/A 06/07/2019   Procedure: COLONOSCOPY WITH PROPOFOL;  Surgeon: [name], MD;  Location: [facility] ENDOSCOPY;  Service: Endoscopy;  Laterality: N/A;   COLONOSCOPY WITH PROPOFOL N/A 02/07/2020   Procedure: COLONOSCOPY WITH PROPOFOL;  Surgeon: [name], MD;  Location: [facility] ENDOSCOPY;  Service: Endoscopy;  Laterality: N/A;   COLONOSCOPY WITH PROPOFOL N/A 12/11/2020   Procedure: COLONOSCOPY  WITH PROPOFOL;  Surgeon: [name], MD;  Location: [facility] ENDOSCOPY;  Service: Endoscopy;  Laterality: N/A;   CORONARY ANGIOPLASTY     CORONARY ARTERY BYPASS GRAFT     x 3   01/2018   ESOPHAGOGASTRODUODENOSCOPY N/A 02/07/2020   Procedure: ESOPHAGOGASTRODUODENOSCOPY (EGD);  Surgeon: [name], MD;  Location: [facility] ENDOSCOPY;  Service: Endoscopy;  Laterality: N/A;   LEFT HEART CATH AND CORONARY ANGIOGRAPHY N/A 01/24/2018   Procedure: LEFT HEART CATH AND CORONARY ANGIOGRAPHY;  Surgeon: [name], MD;  Location: [place] CV LAB;  Service: Cardiovascular;  Laterality: N/A;   TONSILLECTOMY      Family History  Problem Relation Age of Onset   Cancer Mother        died @ 54   CAD Father        First MI @ 60. S/p heart transplant. Died in mid-50's of cancer.   Cancer Father    Other Brother        alive and well    Social History   Socioeconomic History   Marital status: Married    Spouse name: Not on file   Number of children: Not on file   Years of education: Not on file   Highest education level: Not on file  Occupational History   Not on file  Tobacco Use   Smoking status: Never   Smokeless tobacco: Never  Vaping Use   Vaping Use: Never used  Substance and Sexual Activity   Alcohol use: Not Currently    Comment: rare beer   Drug use: No   Sexual activity:  Not on file  Other Topics Concern   Not on file  Social History Narrative   Lives locally with wife.  Works in [employer].  Does not routinely exercise.   Social Determinants of Health   Financial Resource Strain: Not on file  Food Insecurity: Not on file  Transportation Needs: Not on file  Physical Activity: Not on file  Stress: Not on file  Social Connections: Not on file  Intimate Partner Violence: Not on file     Current Outpatient Medications:    amiodarone (PACERONE) 200 MG tablet, Take 1 tablet (200 mg total) by mouth daily., Disp: 90 tablet, Rfl: 1   aspirin 81 MG EC  tablet, Take 81 mg by mouth daily. , Disp: , Rfl:    b complex vitamins tablet, Take 1 tablet by mouth daily. , Disp: , Rfl:    Dulaglutide 1.5 MG/0.5ML SOPN, Inject into the skin once a week. , Disp: , Rfl:    empagliflozin (JARDIANCE) 25 MG TABS tablet, Take 25 mg by mouth daily., Disp: , Rfl:    Gabapentin 50 MG TABS, Take 50 mg by mouth 2 (two) times daily as needed., Disp: 60 tablet, Rfl: 1   losartan (COZAAR) 100 MG tablet, Take 1 tablet (100 mg total) by mouth daily., Disp: 90 tablet, Rfl: 3   metFORMIN (GLUCOPHAGE) 500 MG tablet, Take 2 tablets (1,000 mg total) by mouth 2 (two) times daily., Disp: 360 tablet, Rfl: 2   metoprolol succinate (TOPROL-XL) 50 MG 24 hr tablet, Take 1 tablet (50 mg total) by mouth daily. Take with or immediately following a meal., Disp: 90 tablet, Rfl: 3   Omega-3 Fatty Acids (FISH OIL) 875 MG CAPS, Take 1 capsule by mouth 2 (two) times daily., Disp: , Rfl:    Pancrelipase, Lip-Prot-Amyl, (ZENPEP) 40000-126000 units CPEP, Take 80,000 Units by mouth 3 (three) times daily with meals AND 20,000 Units with snacks., Disp: 600 capsule, Rfl: 1   pramipexole (MIRAPEX) 1.5 MG tablet, Take 1 tablet (1.5 mg total) by mouth 2 (two) times daily., Disp: 180 tablet, Rfl: 3   rosuvastatin (CRESTOR) 20 MG tablet, Take 1 tablet (20 mg total) by mouth daily., Disp: 90 tablet, Rfl: 3   spironolactone (ALDACTONE) 25 MG tablet, Take 1 tablet (25 mg total) by mouth daily., Disp: 90 tablet, Rfl: 0   torsemide (DEMADEX) 20 MG tablet, Take 2 tablets (40 mg total) by mouth daily. 40 mg daily, Disp: 180 tablet, Rfl: 3   traZODone (DESYREL) 50 MG tablet, , Disp: , Rfl:    Allergies  Allergen Reactions   Entresto [Sacubitril-Valsartan] Other (See Comments)    SVT   Lipitor [Atorvastatin] Palpitations    SVT   Pregabalin Other (See Comments)    ROS Review of Systems  Constitutional:  Positive for fatigue.  Eyes:  Positive for blurred vision.  Cardiovascular:  Negative for chest pain.   Neurological:  Positive for dizziness, weakness and headaches.     Objective:    Physical Exam Vitals reviewed.  Constitutional:      Appearance: Normal appearance.  HENT:     Mouth/Throat:     Mouth: Mucous membranes are moist.  Eyes:     Pupils: Pupils are equal, round, and reactive to light.  Neck:     Vascular: No carotid bruit.  Cardiovascular:     Rate and Rhythm: Normal rate and regular rhythm.     Pulses: Normal pulses.     Heart sounds: Normal heart sounds.  Pulmonary:  Effort: Pulmonary effort is normal.     Breath sounds: Normal breath sounds.  Abdominal:     General: Bowel sounds are normal.     Palpations: Abdomen is soft. There is no hepatomegaly, splenomegaly or mass.     Tenderness: There is no abdominal tenderness.     Hernia: No hernia is present.  Musculoskeletal:     Cervical back: Neck supple.     Right lower leg: No edema.     Left lower leg: No edema.  Skin:    Findings: No rash.  Neurological:     Mental Status: He is alert and oriented to person, place, and time.     Motor: No weakness.  Psychiatric:        Mood and Affect: Mood normal.        Behavior: Behavior normal.    BP 132/72    Pulse 72    Ht _0  (1.727 m)    Wt 204 lb 6.4 oz (92.7 kg)    BMI 31.08 kg/m  Wt Readings from Last 3 Encounters:  08/05/21 204 lb 6.4 oz (92.7 kg)  07/10/21 206 lb (93.4 kg)  07/03/21 211 lb (95.7 kg)     Health Maintenance Due  Topic Date Due   COVID-19 Vaccine (1) Never done   HIV Screening  Never done   Hepatitis C Screening  Never done    There are no preventive care reminders to display for this patient.  Lab Results  Component Value Date   TSH 2.780 07/11/2021   Lab Results  Component Value Date   WBC 7.7 09/30/2020   HGB 13.7 09/30/2020   HCT 40.7 09/30/2020   MCV 88.5 09/30/2020   PLT 214 09/30/2020   Lab Results  Component Value Date   NA 136 07/25/2021   K 4.0 07/25/2021   CO2 26 07/25/2021   GLUCOSE 170 (H)  07/25/2021   BUN 31 (H) 07/25/2021   CREATININE 1.32 (H) 07/25/2021   BILITOT 0.4 09/30/2020   ALKPHOS 29 (L) 01/24/2018   AST 27 09/30/2020   ALT 26 09/30/2020   PROT 6.5 09/30/2020   ALBUMIN 3.8 01/24/2018   CALCIUM 9.1 07/25/2021   ANIONGAP 8 07/25/2021   EGFR 57 (L) 07/23/2021   Lab Results  Component Value Date   CHOL 122 09/30/2020   Lab Results  Component Value Date   HDL 35 (L) 09/30/2020   Lab Results  Component Value Date   LDLCALC 67 09/30/2020   Lab Results  Component Value Date   TRIG 121 09/30/2020   Lab Results  Component Value Date   CHOLHDL 3.5 09/30/2020   Lab Results  Component Value Date   HGBA1C 7.2 05/23/2021      Assessment & Plan:   Problem List Items Addressed This Visit       Cardiovascular and Mediastinum   Hypertension     Patient denies any chest pain or shortness of breath there is no history of palpitation or paroxysmal nocturnal dyspnea   patient was advised to follow low-salt low-cholesterol diet    ideally I want to keep systolic blood pressure below 130 mmHg, patient was asked to check blood pressure one times a week and give me a report on that.  Patient will be follow-up in 3 months  or earlier as needed, patient will call me back for any change in the cardiovascular symptoms Patient was advised to buy a book from local bookstore concerning blood pressure and read several chapters  every day.  This will be supplemented by some of the material we will give him from the office.  Patient should also utilize other resources like YouTube and Internet to learn more about the blood pressure and the diet.      Coronary artery disease    Patient does not have any angina        Endocrine   Diabetes mellitus type 2, uncomplicated ([place]) - Primary    Blood sugar is labile, on triple therapy      Relevant Orders   POCT glucose (manual entry) (Completed)     Other   HLD (hyperlipidemia)    Hypercholesterolemia  I advised the  patient to follow Mediterranean diet This diet is rich in fruits vegetables and whole grain, and This diet is also rich in fish and lean meat Patient should also eat a handful of almonds or walnuts daily Recent heart study indicated that average follow-up on this kind of diet reduces the cardiovascular mortality by 50 to 70%==      Vertigo    Occasionally complaining of dizziness      Metabolic syndrome    - I encouraged the patient to lose weight.  - I educated them on making healthy dietary choices including eating more fruits and vegetables and less fried foods. - I encouraged the patient to exercise more, and educated on the benefits of exercise including weight loss, diabetes prevention, and hypertension prevention.   Dietary counseling with a registered dietician  Referral to a weight management support group (e.g. Weight Watchers, Overeaters Anonymous)  If your BMI is greater than 29 or you have gained more than 15 pounds you should work on weight loss.  Attend a healthy cooking class      Patient is considered totally and permanently disabled for any occupation  Meds ordered this encounter  Medications   Gabapentin 50 MG TABS    Sig: Take 50 mg by mouth 2 (two) times daily as needed.    Dispense:  60 tablet    Refill:  1    Follow-up: No follow-ups on file.    [name], MD

## 2021-08-05 NOTE — Assessment & Plan Note (Signed)
Hypercholesterolemia  I advised the patient to follow Mediterranean diet This diet is rich in fruits vegetables and whole grain, and This diet is also rich in fish and lean meat Patient should also eat a handful of almonds or walnuts daily Recent heart study indicated that average follow-up on this kind of diet reduces the cardiovascular mortality by 50 to 70%== 

## 2021-08-05 NOTE — Assessment & Plan Note (Signed)
Blood sugar is labile, on triple therapy

## 2021-08-05 NOTE — Assessment & Plan Note (Signed)
Patient does not have any angina 

## 2021-08-05 NOTE — Assessment & Plan Note (Signed)

## 2021-08-05 NOTE — Assessment & Plan Note (Signed)
Occasionally complaining of dizziness

## 2021-08-06 NOTE — Addendum Note (Signed)
Addended by: Lurlean Nanny on: 08/06/2021 04:55 PM   Modules accepted: Orders

## 2021-08-06 NOTE — Telephone Encounter (Signed)
There is an issue with quantity then I realized you had requested 20,000 units for snack and cannot split as it is a capsule... please advise

## 2021-08-07 ENCOUNTER — Other Ambulatory Visit: Payer: Self-pay | Admitting: Gastroenterology

## 2021-08-07 ENCOUNTER — Telehealth: Payer: Self-pay | Admitting: Gastroenterology

## 2021-08-07 ENCOUNTER — Encounter: Payer: Self-pay | Admitting: Gastroenterology

## 2021-08-07 MED ORDER — ZENPEP 20000-63000 UNITS PO CPEP: 20000.0000 [IU] | ORAL_CAPSULE | ORAL | 0 refills | Status: DC

## 2021-08-07 MED ORDER — ZENPEP 40000-126000 UNITS PO CPEP: 80000.0000 [IU] | ORAL_CAPSULE | Freq: Three times a day (TID) | ORAL | 0 refills | Status: DC

## 2021-08-07 NOTE — Telephone Encounter (Signed)
Inbound call from pt requesting a call back stating that his insurance isn't covering for Zenpep. Pt wants to know if there is an alternative medication. Please advise. Thank you

## 2021-08-08 MED ORDER — ZENPEP 40000-126000 UNITS PO CPEP: ORAL_CAPSULE | ORAL | 1 refills | Status: DC

## 2021-08-08 NOTE — Telephone Encounter (Signed)
I spoke to insurance and pharmacy... Zenpep does not require authorization and is covered, pharmacy did verify and stated that they will have to order  Pt is aware

## 2021-08-08 NOTE — Addendum Note (Signed)
Addended by: Lurlean Nanny on: 08/08/2021 08:37 AM   Modules accepted: Orders

## 2021-08-19 ENCOUNTER — Encounter: Payer: Self-pay | Admitting: Cardiology

## 2021-08-19 ENCOUNTER — Ambulatory Visit (INDEPENDENT_AMBULATORY_CARE_PROVIDER_SITE_OTHER): Payer: 59 | Admitting: Cardiology

## 2021-08-19 ENCOUNTER — Other Ambulatory Visit: Payer: Self-pay

## 2021-08-19 VITALS — BP 110/62 | HR 119 | Ht 68.0 in | Wt 209.0 lb

## 2021-08-19 DIAGNOSIS — I10 Essential (primary) hypertension: Secondary | ICD-10-CM | POA: Diagnosis not present

## 2021-08-19 DIAGNOSIS — I5020 Unspecified systolic (congestive) heart failure: Secondary | ICD-10-CM | POA: Diagnosis not present

## 2021-08-19 DIAGNOSIS — Z951 Presence of aortocoronary bypass graft: Secondary | ICD-10-CM

## 2021-08-19 DIAGNOSIS — I471 Supraventricular tachycardia: Secondary | ICD-10-CM

## 2021-08-19 DIAGNOSIS — I25810 Atherosclerosis of coronary artery bypass graft(s) without angina pectoris: Secondary | ICD-10-CM

## 2021-08-19 MED ORDER — METOLAZONE 2.5 MG PO TABS: 2.5000 mg | ORAL_TABLET | Freq: Every day | ORAL | 3 refills | Status: DC

## 2021-08-19 MED ORDER — AMIODARONE HCL 200 MG PO TABS: 200.0000 mg | ORAL_TABLET | Freq: Two times a day (BID) | ORAL | 2 refills | Status: DC

## 2021-08-19 NOTE — Progress Notes (Signed)
Cardiology Office Note:    Date:  08/19/2021   ID:  Ricardo Brown, DOB 1961-02-08, MRN [id number]  PCP:  [name], MD  Cardiologist:  [name], MD  Electrophysiologist:  None   Referring MD: [name], MD   Chief Complaint  Patient presents with   OTher    6-8 week follow up -- Patient c.o swelling in ankles and SOB. Meds reviewed verbally with patient.      History of Present Illness:    Ricardo Brown is a 61 y.o. male with a hx of SVT on amiodarone, CAD/CABG x3 in 2019, HFrEF EF 40-45%, hypertension, hyperlipidemia, diabetes, who presents for follow-up.    He is being seen for ischemic cardiomyopathy and medication titration.  Had leg edema and shortness of breath at previous visit.  Torsemide increased to 60 mg daily, had worsening renal function, torsemide decreased to 40 milligrams daily.  Patient still with leg edema.    He complains of palpitations earlier this morning, heart rate was 125, similar to when he had his last SVT.  EKG in the office initially showed SVT, patient spontaneously converted to sinus rhythm while in the office.  States feeling dizzy when he goes into SVT.   Prior notes Echo 10/2020 EF 40 to 24%, grade 2 diastolic dysfunction. Patient was seen in the [place] in 06/ 2019 due to nausea, weakness, myalgias.  EKG at the time showed inferior ST elevation with reciprocal changes in the anterior leads and small inferior Q waves.  Patient was deemed late presenting STEMI.   Left heart cath was performed, briefly showed occluded distal RCA, 90% mid LAD, 90% proximal left circumflex.  Due to patient being a diabetic, and left heart cath showing diffuse three-vessel disease, CABG was recommended.  He underwent coronary artery bypass grafting x3 in July 2019 at [facility].  Delene Loll previously tried on patient, he did not tolerate.  He developed feeling of unwell, went into SVT and was taken to the emergency  room.  Patient with history of SVT, who was seen in the emergency room and adenosine given x2 and placed on amiodarone  Echo on 09/2019 showed mild to moderately reduced ejection fraction, EF 40 to 45%.  Past Medical History:  Diagnosis Date   CHF (congestive heart failure) ([facility])    Coronary artery disease    Diabetes mellitus without complication ([place])    a. Dx ~ 2000.   Heart palpitations    a. Pt reports prior nl echo's and stress tests. Last stress test w/in past 2 yrs - PCP.   High cholesterol    Hypertension    Myocardial infarction [facility])     Past Surgical History:  Procedure Laterality Date   CARDIAC CATHETERIZATION     CARDIAC VALVE REPLACEMENT     Mitral Valve Repair   COLONOSCOPY WITH PROPOFOL N/A 06/07/2019   Procedure: COLONOSCOPY WITH PROPOFOL;  Surgeon: [name], MD;  Location: [facility] ENDOSCOPY;  Service: Endoscopy;  Laterality: N/A;   COLONOSCOPY WITH PROPOFOL N/A 02/07/2020   Procedure: COLONOSCOPY WITH PROPOFOL;  Surgeon: [name], MD;  Location: [facility] ENDOSCOPY;  Service: Endoscopy;  Laterality: N/A;   COLONOSCOPY WITH PROPOFOL N/A 12/11/2020   Procedure: COLONOSCOPY WITH PROPOFOL;  Surgeon: [name], MD;  Location: [facility] ENDOSCOPY;  Service: Endoscopy;  Laterality: N/A;   CORONARY ANGIOPLASTY     CORONARY ARTERY BYPASS GRAFT     x 3   01/2018   ESOPHAGOGASTRODUODENOSCOPY N/A 02/07/2020  Procedure: ESOPHAGOGASTRODUODENOSCOPY (EGD);  Surgeon: [name], MD;  Location: [facility] ENDOSCOPY;  Service: Endoscopy;  Laterality: N/A;   LEFT HEART CATH AND CORONARY ANGIOGRAPHY N/A 01/24/2018   Procedure: LEFT HEART CATH AND CORONARY ANGIOGRAPHY;  Surgeon: [name], MD;  Location: [place] CV LAB;  Service: Cardiovascular;  Laterality: N/A;   TONSILLECTOMY      Current Medications: Current Meds  Medication Sig   aspirin 81 MG EC tablet Take 81 mg by mouth daily.    b complex vitamins tablet Take 1 tablet by mouth daily.     Dulaglutide 1.5 MG/0.5ML SOPN Inject into the skin once a week.    empagliflozin (JARDIANCE) 25 MG TABS tablet Take 25 mg by mouth daily.   gabapentin (NEURONTIN) 100 MG capsule Take 1 capsule (100 mg total) by mouth 2 (two) times daily.   losartan (COZAAR) 100 MG tablet Take 1 tablet (100 mg total) by mouth daily.   metFORMIN (GLUCOPHAGE) 500 MG tablet Take 2 tablets (1,000 mg total) by mouth 2 (two) times daily.   metolazone (ZAROXOLYN) 2.5 MG tablet Take 1 tablet (2.5 mg total) by mouth daily. Take 30 mins prior to your Torsemide.   metoprolol succinate (TOPROL-XL) 50 MG 24 hr tablet Take 1 tablet (50 mg total) by mouth daily. Take with or immediately following a meal.   Omega-3 Fatty Acids (FISH OIL) 875 MG CAPS Take 1 capsule by mouth 2 (two) times daily.   Pancrelipase, Lip-Prot-Amyl, (ZENPEP) 40000-126000 units CPEP Take 80,000 Units by mouth 3 (three) times daily with meals AND 40,000 Units with snacks.   pramipexole (MIRAPEX) 1.5 MG tablet Take 1 tablet (1.5 mg total) by mouth 2 (two) times daily.   rosuvastatin (CRESTOR) 20 MG tablet Take 1 tablet (20 mg total) by mouth daily.   spironolactone (ALDACTONE) 25 MG tablet Take 1 tablet (25 mg total) by mouth daily.   torsemide (DEMADEX) 20 MG tablet Take 2 tablets (40 mg total) by mouth daily. 40 mg daily   [DISCONTINUED] amiodarone (PACERONE) 200 MG tablet Take 1 tablet (200 mg total) by mouth daily.     Allergies:   Entresto [sacubitril-valsartan], Lipitor [atorvastatin], and Pregabalin   Social History   Socioeconomic History   Marital status: Married    Spouse name: Not on file   Number of children: Not on file   Years of education: Not on file   Highest education level: Not on file  Occupational History   Not on file  Tobacco Use   Smoking status: Never   Smokeless tobacco: Never  Vaping Use   Vaping Use: Never used  Substance and Sexual Activity   Alcohol use: Not Currently    Comment: rare beer   Drug use: No    Sexual activity: Not on file  Other Topics Concern   Not on file  Social History Narrative   Lives locally with wife.  Works in [employer].  Does not routinely exercise.   Social Determinants of Health   Financial Resource Strain: Not on file  Food Insecurity: Not on file  Transportation Needs: Not on file  Physical Activity: Not on file  Stress: Not on file  Social Connections: Not on file     Family History: The patient's family history includes CAD in his father; Cancer in his father and mother; Other in his brother.  ROS:   Please see the history of present illness.     All other systems reviewed and are negative.  EKGs/Labs/Other Studies  Reviewed:    The following studies were reviewed today:   EKG:  EKG is ordered today.  Initial EKG showed SVT heart rate 119.  Spontaneously converted to sinus rhythm about 20 minutes later, follow-up EKG confirming sinus rhythm.  Recent Labs: 09/30/2020: ALT 26; Hemoglobin 13.7; Platelets 214 07/11/2021: TSH 2.780 07/25/2021: BUN 31; Creatinine, Ser 1.32; Potassium 4.0; Sodium 136  Recent Lipid Panel    Component Value Date/Time   CHOL 122 09/30/2020 1114   TRIG 121 09/30/2020 1114   HDL 35 (L) 09/30/2020 1114   CHOLHDL 3.5 09/30/2020 1114   VLDL 24 01/24/2018 1459   LDLCALC 67 09/30/2020 1114    Physical Exam:    VS:  BP 110/62 (BP Location: Left Arm, Patient Position: Sitting, Cuff Size: Normal)    Pulse (!) 119    Ht 5\' 8"  (1.727 m)    Wt 209 lb (94.8 kg)    SpO2 97%    BMI 31.78 kg/m     Wt Readings from Last 3 Encounters:  08/19/21 209 lb (94.8 kg)  08/05/21 204 lb 6.4 oz (92.7 kg)  07/10/21 206 lb (93.4 kg)     GEN:  Well nourished, well developed in no acute distress HEENT: Normal NECK: No JVD; No carotid bruits CARDIAC: RRR, no murmurs, rubs, gallops RESPIRATORY:  Clear to auscultation without rales, wheezing or rhonchi  ABDOMEN: Soft, non-tender, non-distended MUSCULOSKELETAL:  1+ edema; No deformity   SKIN: Warm and dry NEUROLOGIC:  Alert and oriented x 3 PSYCHIATRIC:  Normal affect   ASSESSMENT:    1. Hx of CABG   2. HFrEF (heart failure with reduced ejection fraction) ([place])   3. Primary hypertension   4. SVT (supraventricular tachycardia) ([place])   5. Coronary artery disease involving coronary bypass graft of native heart without angina pectoris      PLAN:    In order of problems listed above:  CAD status post CABG x3 in 2019. Denies chest pain.  Continue aspirin and Crestor. ICM, echo 10/2020 EF 40 to 45%.  Describes NYHA class II-Brown symptoms.  still volume overloaded with leg edema.  Start metolazone 2.5 mg daily, continue torsemide 40 mg daily.  Check BMP in 1 week.  Continue Aldactone 25 mg daily, losartan 100 mg daily, Toprol-XL 50 mg daily.  Did not tolerate Entresto in the past, went into SVT. Hypertension, BP controlled.    Continue losartan 100 mg daily, Toprol-XL, Aldactone 25 mg daily, torsemide.   SVT,Initial EKG showed SVT heart rate 119.  Spontaneously converted to sinus rhythm about 20 minutes later, follow-up EKG confirming sinus rhythm.  Load with amiodarone 400 mg twice daily x1 week, reduce to 200 mg twice daily after.  Schedule follow-up with EP for additional input/ablation consideration.  Follow-up in 6 weeks.  Total encounter time 40 minutes  Greater than 50% was spent in counseling and coordination of care with the patient   Medication Adjustments/Labs and Tests Ordered: Current medicines are reviewed at length with the patient today.  Concerns regarding medicines are outlined above.  Orders Placed This Encounter  Procedures   Basic metabolic panel   Ambulatory referral to Nephrology   Ambulatory referral to Cardiac Electrophysiology   EKG 12-Lead     Meds ordered this encounter  Medications   amiodarone (PACERONE) 200 MG tablet    Sig: Take 1 tablet (200 mg total) by mouth 2 (two) times daily. Take 2 tabs (400 MG) twice a day for 1 week, Then  take 1 tab (200  MG) twice a day.    Dispense:  70 tablet    Refill:  2   metolazone (ZAROXOLYN) 2.5 MG tablet    Sig: Take 1 tablet (2.5 mg total) by mouth daily. Take 30 mins prior to your Torsemide.    Dispense:  30 tablet    Refill:  3      Patient Instructions  Medication Instructions:   Your physician has recommended you make the following change in your medication:     INCREASE your Amiodarone to 400 MG twice a day for one week and then take 200 MG twice a day.  2.     START taking Metolazone 2.5 MG once a day 30 mins prior to your Torsemide.   *If you need a refill on your cardiac medications before your next appointment, please call your pharmacy*   Lab Work:  Your physician recommends that you return for lab work (BMP) in: in 1 WEEK    Please return to our office on_____________________at______________am/pm    Testing/Procedures: None Ordered   Follow-Up: At [facility], you and your health needs are our priority.  As part of our continuing mission to provide you with exceptional heart care, we have created designated Provider Care Teams.  These Care Teams include your primary Cardiologist (physician) and Advanced Practice Providers (APPs -  Physician Assistants and Nurse Practitioners) who all work together to provide you with the care you need, when you need it.  We recommend signing up for the patient portal called "MyChart".  Sign up information is provided on this After Visit Summary.  MyChart is used to connect with patients for Virtual Visits (Telemedicine).  Patients are able to view lab/test results, encounter notes, upcoming appointments, etc.  Non-urgent messages can be sent to your provider as well.   To learn more about what you can do with MyChart, go to [email or web address].    Your next appointment:   1 month(s)  The format for your next appointment:   In Person  Provider:   [name], MD   ONLY   Other Instructions      Signed, [name], MD  08/19/2021 9:45 AM    [place]

## 2021-08-19 NOTE — H&P (View-Only) (Signed)
Electrophysiology Office Note:    Date:  08/20/2021   ID:  Ricardo Brown, DOB [birth year], MRN [id number]  PCP:  [name], MD  [facility] Cardiologist:  [name], MD  [facility] Electrophysiologist:  None   Referring MD: [name], MD   Chief Complaint: SVT  History of Present Illness:    Ricardo Brown is a 61 y.o. male who presents for an evaluation of SVT at the request of Dr Garen Lah. Their medical history includes CAD s/p CABG 3v, chronic systolic heart failure w/ EF 40%, HTN, HLD, DM.   Patient has intermittent symptomatic SVT with rates int he 120-130 range. He feels dizzy when out of rhythm.   When he presented to clinic 08/19/2021, he was in symptomatic SVT. This spontaneously converted to normal rhythm after 20 minutes. He was started on amiodarone.  On November 11, 2018 when he presented to the emergency department with SVT.  He Ultimately converted with 12 mg of adenosine.  The patient tells me that the amiodarone has helped decrease the frequency of the SVT episodes but has not completely eliminated them.  He tells me in the past he has had an episode where his heart rate was over 200 bpm.    Past Medical History:  Diagnosis Date   CHF (congestive heart failure) (HCC)    Coronary artery disease    Diabetes mellitus without complication ([place])    a. Dx ~ 2000.   Heart palpitations    a. Pt reports prior nl echo's and stress tests. Last stress test w/in past 2 yrs - PCP.   High cholesterol    Hypertension    Myocardial infarction [facility])     Past Surgical History:  Procedure Laterality Date   CARDIAC CATHETERIZATION     CARDIAC VALVE REPLACEMENT     Mitral Valve Repair   COLONOSCOPY WITH PROPOFOL N/A 06/07/2019   Procedure: COLONOSCOPY WITH PROPOFOL;  Surgeon: [name], MD;  Location: [facility] ENDOSCOPY;  Service: Endoscopy;  Laterality: N/A;   COLONOSCOPY WITH PROPOFOL N/A 02/07/2020   Procedure: COLONOSCOPY  WITH PROPOFOL;  Surgeon: [name], MD;  Location: [facility] ENDOSCOPY;  Service: Endoscopy;  Laterality: N/A;   COLONOSCOPY WITH PROPOFOL N/A 12/11/2020   Procedure: COLONOSCOPY WITH PROPOFOL;  Surgeon: [name], MD;  Location: [facility] ENDOSCOPY;  Service: Endoscopy;  Laterality: N/A;   CORONARY ANGIOPLASTY     CORONARY ARTERY BYPASS GRAFT     x 3   01/2018   ESOPHAGOGASTRODUODENOSCOPY N/A 02/07/2020   Procedure: ESOPHAGOGASTRODUODENOSCOPY (EGD);  Surgeon: [name], MD;  Location: [facility] ENDOSCOPY;  Service: Endoscopy;  Laterality: N/A;   LEFT HEART CATH AND CORONARY ANGIOGRAPHY N/A 01/24/2018   Procedure: LEFT HEART CATH AND CORONARY ANGIOGRAPHY;  Surgeon: [name], MD;  Location: [place] CV LAB;  Service: Cardiovascular;  Laterality: N/A;   TONSILLECTOMY      Current Medications: Current Meds  Medication Sig   amiodarone (PACERONE) 200 MG tablet Take 1 tablet (200 mg total) by mouth 2 (two) times daily. Take 2 tabs (400 MG) twice a day for 1 week, Then take 1 tab (200 MG) twice a day.   aspirin 81 MG EC tablet Take 81 mg by mouth daily.    b complex vitamins tablet Take 1 tablet by mouth daily.    Dulaglutide 1.5 MG/0.5ML SOPN Inject into the skin once a week.    empagliflozin (JARDIANCE) 25 MG TABS tablet Take 25 mg by mouth  daily.   gabapentin (NEURONTIN) 100 MG capsule Take 1 capsule (100 mg total) by mouth 2 (two) times daily.   losartan (COZAAR) 100 MG tablet Take 1 tablet (100 mg total) by mouth daily.   metFORMIN (GLUCOPHAGE) 500 MG tablet Take 2 tablets (1,000 mg total) by mouth 2 (two) times daily.   metolazone (ZAROXOLYN) 2.5 MG tablet Take 1 tablet (2.5 mg total) by mouth daily. Take 30 mins prior to your Torsemide.   metoprolol succinate (TOPROL-XL) 50 MG 24 hr tablet Take 1 tablet (50 mg total) by mouth daily. Take with or immediately following a meal.   Omega-3 Fatty Acids (FISH OIL) 875 MG CAPS Take 1 capsule by mouth 2 (two) times daily.    Pancrelipase, Lip-Prot-Amyl, (ZENPEP) 40000-126000 units CPEP Take 80,000 Units by mouth 3 (three) times daily with meals AND 40,000 Units with snacks.   pramipexole (MIRAPEX) 1.5 MG tablet Take 1 tablet (1.5 mg total) by mouth 2 (two) times daily.   rosuvastatin (CRESTOR) 20 MG tablet Take 1 tablet (20 mg total) by mouth daily.   spironolactone (ALDACTONE) 25 MG tablet Take 1 tablet (25 mg total) by mouth daily.   torsemide (DEMADEX) 20 MG tablet Take 2 tablets (40 mg total) by mouth daily. 40 mg daily     Allergies:   Entresto [sacubitril-valsartan], Lipitor [atorvastatin], and Pregabalin   Social History   Socioeconomic History   Marital status: Married    Spouse name: Not on file   Number of children: Not on file   Years of education: Not on file   Highest education level: Not on file  Occupational History   Not on file  Tobacco Use   Smoking status: Never   Smokeless tobacco: Never  Vaping Use   Vaping Use: Never used  Substance and Sexual Activity   Alcohol use: Not Currently    Comment: rare beer   Drug use: No   Sexual activity: Not on file  Other Topics Concern   Not on file  Social History Narrative   Lives locally with wife.  Works in [employer].  Does not routinely exercise.   Social Determinants of Health   Financial Resource Strain: Not on file  Food Insecurity: Not on file  Transportation Needs: Not on file  Physical Activity: Not on file  Stress: Not on file  Social Connections: Not on file     Family History: The patient's family history includes CAD in his father; Cancer in his father and mother; Other in his brother.  ROS:   Please see the history of present illness.    All other systems reviewed and are negative.  EKGs/Labs/Other Studies Reviewed:    The following studies were reviewed today:  10/29/2020 Echo EF 40-45% RV normal  11/11/2019 ECG  RBBB  04/18/2020 ECG  RBBB   EKG:  The ekg ordered today demonstrates sinus rhythm.   Right bundle branch block.   Recent Labs: 09/30/2020: ALT 26; Hemoglobin 13.7; Platelets 214 07/11/2021: TSH 2.780 07/25/2021: BUN 31; Creatinine, Ser 1.32; Potassium 4.0; Sodium 136  Recent Lipid Panel    Component Value Date/Time   CHOL 122 09/30/2020 1114   TRIG 121 09/30/2020 1114   HDL 35 (L) 09/30/2020 1114   CHOLHDL 3.5 09/30/2020 1114   VLDL 24 01/24/2018 1459   LDLCALC 67 09/30/2020 1114    Physical Exam:    VS:  BP (!) 100/50 (BP Location: Left Arm, Patient Position: Sitting, Cuff Size: Normal)    Pulse 82  Ht 5\' 8"  (1.727 m)    Wt 209 lb (94.8 kg)    SpO2 98%    BMI 31.78 kg/m     Wt Readings from Last 3 Encounters:  08/20/21 209 lb (94.8 kg)  08/19/21 209 lb (94.8 kg)  08/05/21 204 lb 6.4 oz (92.7 kg)     GEN:  Well nourished, well developed in no acute distress HEENT: Normal NECK: No JVD; No carotid bruits LYMPHATICS: No lymphadenopathy CARDIAC: RRR, no murmurs, rubs, gallops RESPIRATORY:  Clear to auscultation without rales, wheezing or rhonchi  ABDOMEN: Soft, non-tender, non-distended MUSCULOSKELETAL:  No edema; No deformity  SKIN: Warm and dry NEUROLOGIC:  Alert and oriented x 3 PSYCHIATRIC:  Normal affect       ASSESSMENT:    1. PSVT (paroxysmal supraventricular tachycardia) ([place])   2. Coronary artery disease involving coronary bypass graft of native heart without angina pectoris   3. Primary hypertension    PLAN:    In order of problems listed above:  #SVT Highly symptomatic.  From his history it sounds like he has had a fairly high burden.  These are adenosine sensitive.  I suspect he is having AVNRT but I cannot completely exclude other mechanisms of SVT.  VT significantly less likely.  I discussed the treatment options available to him including continued use of amiodarone and catheter ablation.  Given his young age, I do not think amiodarone is a good option.  We discussed the catheter ablation procedure in detail during today's visit  including the risks, recovery and likelihood of success and he wishes to proceed.  Also discussed the possibility that we would bring him to the lab and not be able to induce the arrhythmia.  Also discussed the possibility that we could induce the arrhythmia but localize it to an area that was unsafe to ablate.  We will stop the amiodarone today.  We will hold his beta-blocker 5 days prior to the procedure.  Therapeutic strategies for supraventricular tachycardia including medicine and ablation were discussed in detail with the patient today. Risk, benefits, and alternatives to EP study and radiofrequency ablation were also discussed in detail today. These risks include but are not limited to stroke, bleeding, vascular damage, tamponade, perforation, damage to the heart and other structures, AV block requiring pacemaker, worsening renal function, and death. The patient understands these risk and wishes to proceed.  We will therefore proceed with catheter ablation at the next available time.  #CAD No ischemic symptoms  #Hypertension Controlled Continue current regimen  #Chronic systolic heart failure NYHA class II.  Warm dry on exam.  Continue current medical therapy.  Control of his SVT will be important.   Total time spent with patient today 60 minutes. This includes reviewing records, evaluating the patient and coordinating care.  Medication Adjustments/Labs and Tests Ordered: Current medicines are reviewed at length with the patient today.  Concerns regarding medicines are outlined above.  No orders of the defined types were placed in this encounter.  No orders of the defined types were placed in this encounter.    Signed, Hilton Cork. Quentin Ore, MD, [facility], [facility] 08/20/2021 11:07 AM    Electrophysiology [facility]

## 2021-08-19 NOTE — Progress Notes (Signed)
Electrophysiology Office Note:    Date:  08/20/2021   ID:  Ricardo Brown, DOB 04-Mar-1961, MRN [id number]  PCP:  [name], MD  [facility] Cardiologist:  [name], MD  [facility] Electrophysiologist:  None   Referring MD: [name], MD   Chief Complaint: SVT  History of Present Illness:    Ricardo Brown is a 61 y.o. male who presents for an evaluation of SVT at the request of Dr Garen Lah. Their medical history includes CAD s/p CABG 3v, chronic systolic heart failure w/ EF 40%, HTN, HLD, DM.   Patient has intermittent symptomatic SVT with rates int he 120-130 range. He feels dizzy when out of rhythm.   When he presented to clinic 08/19/2021, he was in symptomatic SVT. This spontaneously converted to normal rhythm after 20 minutes. He was started on amiodarone.  On November 11, 2018 when he presented to the emergency department with SVT.  He Ultimately converted with 12 mg of adenosine.  The patient tells me that the amiodarone has helped decrease the frequency of the SVT episodes but has not completely eliminated them.  He tells me in the past he has had an episode where his heart rate was over 200 bpm.    Past Medical History:  Diagnosis Date   CHF (congestive heart failure) (HCC)    Coronary artery disease    Diabetes mellitus without complication ([place])    a. Dx ~ 2000.   Heart palpitations    a. Pt reports prior nl echo's and stress tests. Last stress test w/in past 2 yrs - PCP.   High cholesterol    Hypertension    Myocardial infarction [facility])     Past Surgical History:  Procedure Laterality Date   CARDIAC CATHETERIZATION     CARDIAC VALVE REPLACEMENT     Mitral Valve Repair   COLONOSCOPY WITH PROPOFOL N/A 06/07/2019   Procedure: COLONOSCOPY WITH PROPOFOL;  Surgeon: [name], MD;  Location: [facility] ENDOSCOPY;  Service: Endoscopy;  Laterality: N/A;   COLONOSCOPY WITH PROPOFOL N/A 02/07/2020   Procedure: COLONOSCOPY  WITH PROPOFOL;  Surgeon: [name], MD;  Location: [facility] ENDOSCOPY;  Service: Endoscopy;  Laterality: N/A;   COLONOSCOPY WITH PROPOFOL N/A 12/11/2020   Procedure: COLONOSCOPY WITH PROPOFOL;  Surgeon: [name], MD;  Location: [facility] ENDOSCOPY;  Service: Endoscopy;  Laterality: N/A;   CORONARY ANGIOPLASTY     CORONARY ARTERY BYPASS GRAFT     x 3   01/2018   ESOPHAGOGASTRODUODENOSCOPY N/A 02/07/2020   Procedure: ESOPHAGOGASTRODUODENOSCOPY (EGD);  Surgeon: [name], MD;  Location: [facility] ENDOSCOPY;  Service: Endoscopy;  Laterality: N/A;   LEFT HEART CATH AND CORONARY ANGIOGRAPHY N/A 01/24/2018   Procedure: LEFT HEART CATH AND CORONARY ANGIOGRAPHY;  Surgeon: [name], MD;  Location: [place] CV LAB;  Service: Cardiovascular;  Laterality: N/A;   TONSILLECTOMY      Current Medications: Current Meds  Medication Sig   amiodarone (PACERONE) 200 MG tablet Take 1 tablet (200 mg total) by mouth 2 (two) times daily. Take 2 tabs (400 MG) twice a day for 1 week, Then take 1 tab (200 MG) twice a day.   aspirin 81 MG EC tablet Take 81 mg by mouth daily.    b complex vitamins tablet Take 1 tablet by mouth daily.    Dulaglutide 1.5 MG/0.5ML SOPN Inject into the skin once a week.    empagliflozin (JARDIANCE) 25 MG TABS tablet Take 25 mg by mouth  daily.   gabapentin (NEURONTIN) 100 MG capsule Take 1 capsule (100 mg total) by mouth 2 (two) times daily.   losartan (COZAAR) 100 MG tablet Take 1 tablet (100 mg total) by mouth daily.   metFORMIN (GLUCOPHAGE) 500 MG tablet Take 2 tablets (1,000 mg total) by mouth 2 (two) times daily.   metolazone (ZAROXOLYN) 2.5 MG tablet Take 1 tablet (2.5 mg total) by mouth daily. Take 30 mins prior to your Torsemide.   metoprolol succinate (TOPROL-XL) 50 MG 24 hr tablet Take 1 tablet (50 mg total) by mouth daily. Take with or immediately following a meal.   Omega-3 Fatty Acids (FISH OIL) 875 MG CAPS Take 1 capsule by mouth 2 (two) times daily.    Pancrelipase, Lip-Prot-Amyl, (ZENPEP) 40000-126000 units CPEP Take 80,000 Units by mouth 3 (three) times daily with meals AND 40,000 Units with snacks.   pramipexole (MIRAPEX) 1.5 MG tablet Take 1 tablet (1.5 mg total) by mouth 2 (two) times daily.   rosuvastatin (CRESTOR) 20 MG tablet Take 1 tablet (20 mg total) by mouth daily.   spironolactone (ALDACTONE) 25 MG tablet Take 1 tablet (25 mg total) by mouth daily.   torsemide (DEMADEX) 20 MG tablet Take 2 tablets (40 mg total) by mouth daily. 40 mg daily     Allergies:   Entresto [sacubitril-valsartan], Lipitor [atorvastatin], and Pregabalin   Social History   Socioeconomic History   Marital status: Married    Spouse name: Not on file   Number of children: Not on file   Years of education: Not on file   Highest education level: Not on file  Occupational History   Not on file  Tobacco Use   Smoking status: Never   Smokeless tobacco: Never  Vaping Use   Vaping Use: Never used  Substance and Sexual Activity   Alcohol use: Not Currently    Comment: rare beer   Drug use: No   Sexual activity: Not on file  Other Topics Concern   Not on file  Social History Narrative   Lives locally with wife.  Works in [employer].  Does not routinely exercise.   Social Determinants of Health   Financial Resource Strain: Not on file  Food Insecurity: Not on file  Transportation Needs: Not on file  Physical Activity: Not on file  Stress: Not on file  Social Connections: Not on file     Family History: The patient's family history includes CAD in his father; Cancer in his father and mother; Other in his brother.  ROS:   Please see the history of present illness.    All other systems reviewed and are negative.  EKGs/Labs/Other Studies Reviewed:    The following studies were reviewed today:  10/29/2020 Echo EF 40-45% RV normal  11/11/2019 ECG  RBBB  04/18/2020 ECG  RBBB   EKG:  The ekg ordered today demonstrates sinus rhythm.   Right bundle branch block.   Recent Labs: 09/30/2020: ALT 26; Hemoglobin 13.7; Platelets 214 07/11/2021: TSH 2.780 07/25/2021: BUN 31; Creatinine, Ser 1.32; Potassium 4.0; Sodium 136  Recent Lipid Panel    Component Value Date/Time   CHOL 122 09/30/2020 1114   TRIG 121 09/30/2020 1114   HDL 35 (L) 09/30/2020 1114   CHOLHDL 3.5 09/30/2020 1114   VLDL 24 01/24/2018 1459   LDLCALC 67 09/30/2020 1114    Physical Exam:    VS:  BP (!) 100/50 (BP Location: Left Arm, Patient Position: Sitting, Cuff Size: Normal)    Pulse 82  Ht 5\' 8"  (1.727 m)    Wt 209 lb (94.8 kg)    SpO2 98%    BMI 31.78 kg/m     Wt Readings from Last 3 Encounters:  08/20/21 209 lb (94.8 kg)  08/19/21 209 lb (94.8 kg)  08/05/21 204 lb 6.4 oz (92.7 kg)     GEN:  Well nourished, well developed in no acute distress HEENT: Normal NECK: No JVD; No carotid bruits LYMPHATICS: No lymphadenopathy CARDIAC: RRR, no murmurs, rubs, gallops RESPIRATORY:  Clear to auscultation without rales, wheezing or rhonchi  ABDOMEN: Soft, non-tender, non-distended MUSCULOSKELETAL:  No edema; No deformity  SKIN: Warm and dry NEUROLOGIC:  Alert and oriented x 3 PSYCHIATRIC:  Normal affect       ASSESSMENT:    1. PSVT (paroxysmal supraventricular tachycardia) ([place])   2. Coronary artery disease involving coronary bypass graft of native heart without angina pectoris   3. Primary hypertension    PLAN:    In order of problems listed above:  #SVT Highly symptomatic.  From his history it sounds like he has had a fairly high burden.  These are adenosine sensitive.  I suspect he is having AVNRT but I cannot completely exclude other mechanisms of SVT.  VT significantly less likely.  I discussed the treatment options available to him including continued use of amiodarone and catheter ablation.  Given his young age, I do not think amiodarone is a good option.  We discussed the catheter ablation procedure in detail during today's visit  including the risks, recovery and likelihood of success and he wishes to proceed.  Also discussed the possibility that we would bring him to the lab and not be able to induce the arrhythmia.  Also discussed the possibility that we could induce the arrhythmia but localize it to an area that was unsafe to ablate.  We will stop the amiodarone today.  We will hold his beta-blocker 5 days prior to the procedure.  Therapeutic strategies for supraventricular tachycardia including medicine and ablation were discussed in detail with the patient today. Risk, benefits, and alternatives to EP study and radiofrequency ablation were also discussed in detail today. These risks include but are not limited to stroke, bleeding, vascular damage, tamponade, perforation, damage to the heart and other structures, AV block requiring pacemaker, worsening renal function, and death. The patient understands these risk and wishes to proceed.  We will therefore proceed with catheter ablation at the next available time.  #CAD No ischemic symptoms  #Hypertension Controlled Continue current regimen  #Chronic systolic heart failure NYHA class II.  Warm dry on exam.  Continue current medical therapy.  Control of his SVT will be important.   Total time spent with patient today 60 minutes. This includes reviewing records, evaluating the patient and coordinating care.  Medication Adjustments/Labs and Tests Ordered: Current medicines are reviewed at length with the patient today.  Concerns regarding medicines are outlined above.  No orders of the defined types were placed in this encounter.  No orders of the defined types were placed in this encounter.    Signed, Hilton Cork. Quentin Ore, MD, [facility], [facility] 08/20/2021 11:07 AM    Electrophysiology [facility]

## 2021-08-19 NOTE — Patient Instructions (Signed)
Medication Instructions:   Your physician has recommended you make the following change in your medication:     INCREASE your Amiodarone to 400 MG twice a day for one week and then take 200 MG twice a day.  2.     START taking Metolazone 2.5 MG once a day 30 mins prior to your Torsemide.   *If you need a refill on your cardiac medications before your next appointment, please call your pharmacy*   Lab Work:  Your physician recommends that you return for lab work (BMP) in: in 1 WEEK    Please return to our office on_____________________at______________am/pm    Testing/Procedures: None Ordered   Follow-Up: At [facility], you and your health needs are our priority.  As part of our continuing mission to provide you with exceptional heart care, we have created designated Provider Care Teams.  These Care Teams include your primary Cardiologist (physician) and Advanced Practice Providers (APPs -  Physician Assistants and Nurse Practitioners) who all work together to provide you with the care you need, when you need it.  We recommend signing up for the patient portal called "MyChart".  Sign up information is provided on this After Visit Summary.  MyChart is used to connect with patients for Virtual Visits (Telemedicine).  Patients are able to view lab/test results, encounter notes, upcoming appointments, etc.  Non-urgent messages can be sent to your provider as well.   To learn more about what you can do with MyChart, go to [email or web address].    Your next appointment:   1 month(s)  The format for your next appointment:   In Person  Provider:   [name], MD   ONLY   Other Instructions

## 2021-08-20 ENCOUNTER — Ambulatory Visit (INDEPENDENT_AMBULATORY_CARE_PROVIDER_SITE_OTHER): Payer: 59 | Admitting: Cardiology

## 2021-08-20 ENCOUNTER — Encounter: Payer: Self-pay | Admitting: Cardiology

## 2021-08-20 ENCOUNTER — Telehealth: Payer: Self-pay | Admitting: Cardiology

## 2021-08-20 ENCOUNTER — Institutional Professional Consult (permissible substitution): Payer: 59 | Admitting: Cardiology

## 2021-08-20 ENCOUNTER — Encounter: Payer: Self-pay | Admitting: *Deleted

## 2021-08-20 VITALS — BP 100/50 | HR 82 | Ht 68.0 in | Wt 209.0 lb

## 2021-08-20 DIAGNOSIS — I10 Essential (primary) hypertension: Secondary | ICD-10-CM

## 2021-08-20 DIAGNOSIS — I471 Supraventricular tachycardia: Secondary | ICD-10-CM

## 2021-08-20 DIAGNOSIS — I25810 Atherosclerosis of coronary artery bypass graft(s) without angina pectoris: Secondary | ICD-10-CM

## 2021-08-20 NOTE — Telephone Encounter (Signed)
Pt c/o medication issue:  1. Name of Medication: metolazone   2. How are you currently taking this medication (dosage and times per day)? new  3. Are you having a reaction (difficulty breathing--STAT)? no  4. What is your medication issue? Patient noted at checkout today pharmacy delayed filling metolazone due to stock .  Patient was advised it would be ready in a few days.   Encouraged to keep lab appt next week and to also to call and let us know when he starts taking incase we need to adjust lab schedule.   Patient understands and will call when starting new med .

## 2021-08-20 NOTE — Patient Instructions (Signed)
Medications: Stop Amiodarone Your physician recommends that you continue on your current medications as directed. Please refer to the Current Medication list given to you today. *If you need a refill on your cardiac medications before your next appointment, please call your pharmacy*  Lab Work: CBC/diff, BMP If you have labs (blood work) drawn today and your tests are completely normal, you will receive your results only by: Ricardo Brown (if you have MyChart) OR A paper copy in the mail If you have any lab test that is abnormal or we need to change your treatment, we will call you to review the results.  Testing/Procedures: Your physician has recommended that you have an ablation. Catheter ablation is a medical procedure used to treat some cardiac arrhythmias (irregular heartbeats). During catheter ablation, a long, thin, flexible tube is put into a blood vessel in your groin (upper thigh), or neck. This tube is called an ablation catheter. It is then guided to your heart through the blood vessel. Radio frequency waves destroy small areas of heart tissue where abnormal heartbeats may cause an arrhythmia to start. Please see the instruction sheet given to you today.   Follow-Up: At [facility], you and your health needs are our priority.  As part of our continuing mission to provide you with exceptional heart care, we have created designated Provider Care Teams.  These Care Teams include your primary Cardiologist (physician) and Advanced Practice Providers (APPs -  Physician Assistants and Nurse Practitioners) who all work together to provide you with the care you need, when you need it.  Your physician wants you to follow-up in: 4 weeks with Ricardo Brown post ablation (Feb 10)   We recommend signing up for the patient portal called "MyChart".  Sign up information is provided on this After Visit Summary.  MyChart is used to connect with patients for Virtual Visits (Telemedicine).   Patients are able to view lab/test results, encounter notes, upcoming appointments, etc.  Non-urgent messages can be sent to your provider as well.   To learn more about what you can do with MyChart, go to [email or web address].    Any Other Special Instructions Will Be Listed Below (If Applicable).   Cardiac Ablation Cardiac ablation is a procedure to destroy (ablate) some heart tissue that is sending bad signals. These bad signals cause problems in heart rhythm. The heart has many areas that make these signals. If there are problems in these areas, they can make the heart beat in a way that is not normal. Destroying some tissues can help make the heart rhythm normal. Tell your doctor about: Any allergies you have. All medicines you are taking. These include vitamins, herbs, eye drops, creams, and over-the-counter medicines. Any problems you or family members have had with medicines that make you fall asleep (anesthetics). Any blood disorders you have. Any surgeries you have had. Any medical conditions you have, such as kidney failure. Whether you are pregnant or may be pregnant. What are the risks? This is a safe procedure. But problems may occur, including: Infection. Bruising and bleeding. Bleeding into the chest. Stroke or blood clots. Damage to nearby areas of your body. Allergies to medicines or dyes. The need for a pacemaker if the normal system is damaged. Failure of the procedure to treat the problem. What happens before the procedure? Medicines Ask your doctor about: Changing or stopping your normal medicines. This is important. Taking aspirin and ibuprofen. Do not take these medicines unless your doctor tells you to take them.  Taking other medicines, vitamins, herbs, and supplements. General instructions Follow instructions from your doctor about what you cannot eat or drink. Plan to have someone take you home from the hospital or clinic. If you will be going home  right after the procedure, plan to have someone with you for 24 hours. Ask your doctor what steps will be taken to prevent infection. What happens during the procedure?  An IV tube will be put into one of your veins. You will be given a medicine to help you relax. The skin on your neck or groin will be numbed. A cut (incision) will be made in your neck or groin. A needle will be put through your cut and into a large vein. A tube (catheter) will be put into the needle. The tube will be moved to your heart. Dye may be put through the tube. This helps your doctor see your heart. Small devices (electrodes) on the tube will send out signals. A type of energy will be used to destroy some heart tissue. The tube will be taken out. Pressure will be held on your cut. This helps stop bleeding. A bandage will be put over your cut. The exact procedure may vary among doctors and hospitals. What happens after the procedure? You will be watched until you leave the hospital or clinic. This includes checking your heart rate, breathing rate, oxygen, and blood pressure. Your cut will be watched for bleeding. You will need to lie still for a few hours. Do not drive for 24 hours or as long as your doctor tells you. Summary Cardiac ablation is a procedure to destroy some heart tissue. This is done to treat heart rhythm problems. Tell your doctor about any medical conditions you may have. Tell him or her about all medicines you are taking to treat them. This is a safe procedure. But problems may occur. These include infection, bruising, bleeding, and damage to nearby areas of your body. Follow what your doctor tells you about food and drink. You may also be told to change or stop some of your medicines. After the procedure, do not drive for 24 hours or as long as your doctor tells you. This information is not intended to replace advice given to you by your health care provider. Make sure you discuss any questions  you have with your health care provider. Document Revised: 06/22/2019 Document Reviewed: 06/22/2019 Elsevier Patient Education  2022 [employer].

## 2021-08-21 LAB — CBC WITH DIFFERENTIAL/PLATELET
Basophils Absolute: 0.1 10*3/uL (ref 0.0–0.2)
Basos: 1 %
EOS (ABSOLUTE): 0.1 10*3/uL (ref 0.0–0.4)
Eos: 1 %
Hematocrit: 38 % (ref 37.5–51.0)
Hemoglobin: 13 g/dL (ref 13.0–17.7)
Immature Grans (Abs): 0.1 10*3/uL (ref 0.0–0.1)
Immature Granulocytes: 1 %
Lymphocytes Absolute: 0.9 10*3/uL (ref 0.7–3.1)
Lymphs: 9 %
MCH: 30.4 pg (ref 26.6–33.0)
MCHC: 34.2 g/dL (ref 31.5–35.7)
MCV: 89 fL (ref 79–97)
Monocytes Absolute: 0.7 10*3/uL (ref 0.1–0.9)
Monocytes: 8 %
Neutrophils Absolute: 7.6 10*3/uL — ABNORMAL HIGH (ref 1.4–7.0)
Neutrophils: 80 %
Platelets: 197 10*3/uL (ref 150–450)
RBC: 4.28 x10E6/uL (ref 4.14–5.80)
RDW: 12.9 % (ref 11.6–15.4)
WBC: 9.4 10*3/uL (ref 3.4–10.8)

## 2021-08-21 LAB — BASIC METABOLIC PANEL
BUN/Creatinine Ratio: 23 (ref 10–24)
BUN: 38 mg/dL — ABNORMAL HIGH (ref 8–27)
CO2: 20 mmol/L (ref 20–29)
Calcium: 8.9 mg/dL (ref 8.6–10.2)
Chloride: 99 mmol/L (ref 96–106)
Creatinine, Ser: 1.66 mg/dL — ABNORMAL HIGH (ref 0.76–1.27)
Glucose: 197 mg/dL — ABNORMAL HIGH (ref 70–99)
Potassium: 4.6 mmol/L (ref 3.5–5.2)
Sodium: 141 mmol/L (ref 134–144)
eGFR: 47 mL/min/{1.73_m2} — ABNORMAL LOW (ref >59)

## 2021-08-25 ENCOUNTER — Other Ambulatory Visit (INDEPENDENT_AMBULATORY_CARE_PROVIDER_SITE_OTHER): Payer: Self-pay | Admitting: Podiatry

## 2021-08-25 ENCOUNTER — Other Ambulatory Visit: Payer: Self-pay | Admitting: *Deleted

## 2021-08-25 DIAGNOSIS — I739 Peripheral vascular disease, unspecified: Secondary | ICD-10-CM

## 2021-08-25 MED ORDER — SPIRONOLACTONE 25 MG PO TABS: 25.0000 mg | ORAL_TABLET | Freq: Every day | ORAL | 1 refills | Status: DC

## 2021-08-26 ENCOUNTER — Other Ambulatory Visit: Payer: Self-pay

## 2021-08-26 ENCOUNTER — Other Ambulatory Visit (INDEPENDENT_AMBULATORY_CARE_PROVIDER_SITE_OTHER): Payer: 59

## 2021-08-26 DIAGNOSIS — I5020 Unspecified systolic (congestive) heart failure: Secondary | ICD-10-CM

## 2021-08-27 ENCOUNTER — Ambulatory Visit (INDEPENDENT_AMBULATORY_CARE_PROVIDER_SITE_OTHER): Payer: BLUE CROSS/BLUE SHIELD

## 2021-08-27 ENCOUNTER — Ambulatory Visit (INDEPENDENT_AMBULATORY_CARE_PROVIDER_SITE_OTHER): Payer: 59 | Admitting: Nurse Practitioner

## 2021-08-27 ENCOUNTER — Telehealth: Payer: Self-pay | Admitting: *Deleted

## 2021-08-27 ENCOUNTER — Encounter: Payer: Self-pay | Admitting: Nurse Practitioner

## 2021-08-27 VITALS — BP 120/70 | HR 71 | Ht 68.0 in | Wt 206.0 lb

## 2021-08-27 DIAGNOSIS — N179 Acute kidney failure, unspecified: Secondary | ICD-10-CM

## 2021-08-27 DIAGNOSIS — I25810 Atherosclerosis of coronary artery bypass graft(s) without angina pectoris: Secondary | ICD-10-CM | POA: Diagnosis not present

## 2021-08-27 DIAGNOSIS — E785 Hyperlipidemia, unspecified: Secondary | ICD-10-CM

## 2021-08-27 DIAGNOSIS — I10 Essential (primary) hypertension: Secondary | ICD-10-CM | POA: Diagnosis not present

## 2021-08-27 DIAGNOSIS — I5020 Unspecified systolic (congestive) heart failure: Secondary | ICD-10-CM | POA: Diagnosis not present

## 2021-08-27 DIAGNOSIS — I471 Supraventricular tachycardia: Secondary | ICD-10-CM

## 2021-08-27 DIAGNOSIS — I2510 Atherosclerotic heart disease of native coronary artery without angina pectoris: Secondary | ICD-10-CM | POA: Diagnosis not present

## 2021-08-27 DIAGNOSIS — I739 Peripheral vascular disease, unspecified: Secondary | ICD-10-CM

## 2021-08-27 LAB — BASIC METABOLIC PANEL
BUN/Creatinine Ratio: 27 — ABNORMAL HIGH (ref 10–24)
BUN: 69 mg/dL — ABNORMAL HIGH (ref 8–27)
CO2: 23 mmol/L (ref 20–29)
Calcium: 9 mg/dL (ref 8.6–10.2)
Chloride: 96 mmol/L (ref 96–106)
Creatinine, Ser: 2.51 mg/dL — ABNORMAL HIGH (ref 0.76–1.27)
Glucose: 169 mg/dL — ABNORMAL HIGH (ref 70–99)
Potassium: 4.8 mmol/L (ref 3.5–5.2)
Sodium: 136 mmol/L (ref 134–144)
eGFR: 29 mL/min/{1.73_m2} — ABNORMAL LOW (ref >59)

## 2021-08-27 MED ORDER — METOLAZONE 2.5 MG PO TABS: ORAL_TABLET | ORAL | 3 refills | Status: DC

## 2021-08-27 NOTE — Progress Notes (Addendum)
Office Visit    Patient Name: Ricardo Brown Date of Encounter: 08/27/2021  Primary Care Provider:  [name], MD Primary Cardiologist:  [name], MD  Chief Complaint    61 year old male with a history of CAD status post three-vessel CABG in June 2019, hypertension, hyperlipidemia, chronic combined systolic and diastolic congestive heart failure, ischemic cardiomyopathy, mitral regurgitation status post mitral valve repair, postoperative atrial fibrillation, and PSVT, who presents for follow-up related to ongoing lower extremity swelling and elevation in creatinine in the setting of outpatient diuresis.  Past Medical History    Past Medical History:  Diagnosis Date   Chronic combined systolic (congestive) and diastolic (congestive) heart failure ([place])    a. 01/2018 EF 35-40%; b. 10/2020 Echo: EF 40-45%, glob HK. RVSP 42.36mmHg. Mildly dil LA. Mild MS/AoV sclerosis.   Coronary artery disease    a. 01/2018 late presenting inferior MI; b. 01/2018 Cath: Severe multivessel dzs-->CABG x 3 (LIMA->LAD, VG->OM2, VG->RPDA @ [facility]).   Diabetes mellitus without complication ([place])    a. Dx ~ 2000.   Heart palpitations    a. Pt reports prior nl echo's and stress tests. Last stress test w/in past 2 yrs - PCP.   High cholesterol    Hypertension    Mitral regurgitation    a. 01/2018 s/p MV repair @ time of CABG.   Myocardial infarction [facility])    Postoperative atrial fibrillation    a. 01/2018 @ time of CABG.   PSVT (paroxysmal supraventricular tachycardia) ([place])    a. Very symptomatic with multiple ED evaluations.  Has been on amio.   Past Surgical History:  Procedure Laterality Date   CARDIAC CATHETERIZATION     CARDIAC VALVE REPLACEMENT     Mitral Valve Repair   COLONOSCOPY WITH PROPOFOL N/A 06/07/2019   Procedure: COLONOSCOPY WITH PROPOFOL;  Surgeon: [name], MD;  Location: [facility] ENDOSCOPY;  Service: Endoscopy;  Laterality: N/A;   COLONOSCOPY WITH PROPOFOL N/A  02/07/2020   Procedure: COLONOSCOPY WITH PROPOFOL;  Surgeon: [name], MD;  Location: [facility] ENDOSCOPY;  Service: Endoscopy;  Laterality: N/A;   COLONOSCOPY WITH PROPOFOL N/A 12/11/2020   Procedure: COLONOSCOPY WITH PROPOFOL;  Surgeon: [name], MD;  Location: [facility] ENDOSCOPY;  Service: Endoscopy;  Laterality: N/A;   CORONARY ANGIOPLASTY     CORONARY ARTERY BYPASS GRAFT     x 3   01/2018   ESOPHAGOGASTRODUODENOSCOPY N/A 02/07/2020   Procedure: ESOPHAGOGASTRODUODENOSCOPY (EGD);  Surgeon: [name], MD;  Location: [facility] ENDOSCOPY;  Service: Endoscopy;  Laterality: N/A;   LEFT HEART CATH AND CORONARY ANGIOGRAPHY N/A 01/24/2018   Procedure: LEFT HEART CATH AND CORONARY ANGIOGRAPHY;  Surgeon: [name], MD;  Location: [place] CV LAB;  Service: Cardiovascular;  Laterality: N/A;   TONSILLECTOMY      Allergies  Allergies  Allergen Reactions   Entresto [Sacubitril-Valsartan] Other (See Comments)    SVT   Lipitor [Atorvastatin] Palpitations    SVT   Pregabalin Other (See Comments)    History of Present Illness    61 year old male with the above past medical history including CAD, hypertension, hyperlipidemia, chronic combined systolic and diastolic ingestive heart failure, ischemic cardiomyopathy, mitral regurgitation status post mitral valve pair, postoperative atrial fibrillation, and PSVT.  In June 2019, he was admitted with late presenting inferior MI and underwent diagnostic catheterization revealing severe multivessel disease.  He was subsequently transferred to [facility] and underwent CABG x3.  EF was 35 to 40% at the time of his  MI and subsequent echoes have showed stabilization at 40 to 45% with global hypokinesis.  Most recent echo also showed an elevated RVSP of 42.9 mmHg.  Following his bypass surgery, he did have postoperative atrial fibrillation was on amiodarone for a brief period of time.  After discontinuation, he had recurrent SVT, which he notes he  has had for about 20 years, and was placed back on amiodarone.  Despite ongoing amiodarone therapy, he has continued to have intermittent episodes of SVT sometimes requiring ED evaluation and adenosine.  In the setting of lower extremity swelling over the past year, he was switched from Lasix to torsemide.  Titration of torsemide to greater than 40 mg daily resulted in elevation in creatinine and at his last visit on January 17, he was encouraged to continue torsemide 40 mg daily and metolazone 2.5 mg daily was added with plan for follow-up basic metabolic panel this week.  He also saw electrophysiology on January 18 and has been scheduled for SVT ablation on February 10.  He was supposed to have stopped his amiodarone but he is afraid to stop it cold [place] and has been weaning it off at home.  Is currently taking 200 mg a day and plans to go to every other day later this week before stopping it next week.  He has fears that he will have recurrent SVT as soon as he stops it.  Lab work performed yesterday showed elevation in BUN/creatinine to 69 and 2.51 from 38 and 1.66 on January 18.  In that setting, he was contacted by her nursing staff and reported low blood pressures at times in the afternoons and therefore, he was added to my schedule today.  He notes chronic dyspnea on exertion dating back to his bypass surgery.  On metolazone, he does not think he saw a significant increase in urine output and weight moved little.  He was 206 pounds on his home scale today.  He denies PND, orthopnea, chest pain, palpitations, dizziness, syncope, or early satiety.  He continues to have right greater than left ankle and calf swelling.  Home Medications    Current Outpatient Medications  Medication Sig Dispense Refill   aspirin 81 MG EC tablet Take 81 mg by mouth daily.      b complex vitamins tablet Take 1 tablet by mouth daily.      doxycycline (VIBRA-TABS) 100 MG tablet Take 100 mg by mouth 2 (two) times daily.      Dulaglutide 1.5 MG/0.5ML SOPN Inject into the skin once a week.      empagliflozin (JARDIANCE) 25 MG TABS tablet Take 25 mg by mouth daily.     gabapentin (NEURONTIN) 100 MG capsule Take 1 capsule (100 mg total) by mouth 2 (two) times daily. 60 capsule 1   losartan (COZAAR) 100 MG tablet Take 1 tablet (100 mg total) by mouth daily. 90 tablet 3   metFORMIN (GLUCOPHAGE) 500 MG tablet Take 2 tablets (1,000 mg total) by mouth 2 (two) times daily. 360 tablet 2   metoprolol succinate (TOPROL-XL) 50 MG 24 hr tablet Take 1 tablet (50 mg total) by mouth daily. Take with or immediately following a meal. 90 tablet 3   Omega-3 Fatty Acids (FISH OIL) 875 MG CAPS Take 1 capsule by mouth 2 (two) times daily.     Pancrelipase, Lip-Prot-Amyl, (ZENPEP) 40000-126000 units CPEP Take 80,000 Units by mouth 3 (three) times daily with meals AND 40,000 Units with snacks. 600 capsule 1   pramipexole (MIRAPEX) 1.5  MG tablet Take 1 tablet (1.5 mg total) by mouth 2 (two) times daily. 180 tablet 3   rosuvastatin (CRESTOR) 20 MG tablet Take 1 tablet (20 mg total) by mouth daily. 90 tablet 3   spironolactone (ALDACTONE) 25 MG tablet Take 1 tablet (25 mg total) by mouth daily. 90 tablet 1   torsemide (DEMADEX) 20 MG tablet Take 2 tablets (40 mg total) by mouth daily. 40 mg daily 180 tablet 3   TRESIBA FLEXTOUCH 200 UNIT/ML FlexTouch Pen 14 Units daily.     metolazone (ZAROXOLYN) 2.5 MG tablet Take 1 tablet (2.5 mg) by mouth once a week on Wednesdays. Take 30 mins prior to your Torsemide. 30 tablet 3   No current facility-administered medications for this visit.     Review of Systems    Chronic dyspnea exertion.  Ongoing right greater than left ankle swelling.  He feels fatigued in the afternoons when blood pressures dropped.  He denies chest pain, palpitations, PND, orthopnea, dizziness, or early satiety.  All other systems reviewed and are otherwise negative except as noted above.    Physical Exam    VS:  BP 120/70 (BP  Location: Left Arm, Patient Position: Sitting, Cuff Size: Normal)    Pulse 71    Ht 5\' 8"  (1.727 m)    Wt 206 lb (93.4 kg)    SpO2 97%    BMI 31.32 kg/m  , BMI Body mass index is 31.32 kg/m.     GEN: Well nourished, well developed, in no acute distress. HEENT: normal. Neck: Supple, no JVD, carotid bruits, or masses. Cardiac: RRR, no murmurs, rubs, or gallops. No clubbing, cyanosis, 1+ right calf with 2+ right ankle edema.  Trace to 1+ left lower extremity edema.  Radials 2+/PT 1+ and equal bilaterally.  Respiratory:  Respirations regular and unlabored, clear to auscultation bilaterally. GI: Soft, nontender, nondistended, BS + x 4. MS: no deformity or atrophy. Skin: warm and dry, no rash. Neuro:  Strength and sensation are intact. Psych: Normal affect.  Accessory Clinical Findings    ECG personally reviewed by me today - RSR, 71, RBBB, inf infarct - no acute changes.  Lab Results  Component Value Date   WBC 9.4 08/20/2021   HGB 13.0 08/20/2021   HCT 38.0 08/20/2021   MCV 89 08/20/2021   PLT 197 08/20/2021   Lab Results  Component Value Date   CREATININE 2.51 (H) 08/26/2021   BUN 69 (H) 08/26/2021   NA 136 08/26/2021   K 4.8 08/26/2021   CL 96 08/26/2021   CO2 23 08/26/2021   Lab Results  Component Value Date   ALT 26 09/30/2020   AST 27 09/30/2020   ALKPHOS 29 (L) 01/24/2018   BILITOT 0.4 09/30/2020   Lab Results  Component Value Date   CHOL 122 09/30/2020   HDL 35 (L) 09/30/2020   LDLCALC 67 09/30/2020   TRIG 121 09/30/2020   CHOLHDL 3.5 09/30/2020    Lab Results  Component Value Date   HGBA1C 7.2 05/23/2021    Assessment & Plan    1.  Chronic combined systolic and diastolic ingestive heart failure with acute kidney injury: EF 40 to 45% by echo in March 2022.  In the setting of lower extremity swelling chronic dyspnea exertion, metolazone was added to his regimen last week, which also includes torsemide 40 mg daily.  Patient had labs yesterday with rising BUN  and creatinine to 69 and 2.51.  He did not notice significant improvement in urine output.  His weight was 206 pounds on his home scale today which is in the middle of its typical range (202 to 210).  On exam, he does have right greater than left lower extremity swelling as was previously described but his lungs are clear and he has no JVD.  Abdomen is also soft.  In discussing his lower extremity swelling with him further, he notes that it is usually better in the morning and progresses as he sits throughout the day.  I have asked him to use metolazone 2.5 mg on Wednesdays only going forward and otherwise continue torsemide 40 mg daily.  We discussed the importance of closely monitoring his sodium intake.  He says he puts hot sauce on everything we discussed the salt content of hot sauce.  I also encouraged him to keep his legs elevated.  He is not able to get compression socks on and so we talked about using Ace wraps to provide compression to his lower legs.  I will see him back in clinic in 1 week to reevaluate his heart failure and labs.  Continue beta-blocker, ARB, empagliflozin, and spironolactone.  2.  Acute kidney injury/CKD II-Brown: See above, in the setting of addition of metolazone, BUN and creatinine elevated at 69 and 2.51.  He had been taking metolazone daily and advised to hold it for the next week.  There may be a role for metolazone but likely on a once to 3 times per week regimen.  I will plan to see him back next week with follow-up labs at that time.  3.  Coronary artery disease: Status post CABG x3 in 2019.  No chest pain.  He does have chronic dyspnea exertion ever since his bypass.  Continue aspirin, beta-blocker, and statin therapy.  4.  Essential hypertension: Stable.  5.  Hyperlipidemia: LDL of 67 in February of last year.  Continue statin therapy.  6.  PSVT: Seen by EP with recommendation to discontinue amiodarone.  He continues to take it but is currently taking 200 mg daily and  plans to drop to 200 mg every other day later this week with plan to discontinue next week.  We discussed the importance of discontinuation of amiodarone in the context of pending catheter ablation and need to induce the arrhythmia.  Continue beta-blocker.  7.  Type 2 diabetes mellitus: A1c 7.2 in October.  Insulin management per his primary provider/endocrinologist.  He is on an SGLT2 inhibitor which may need to be discontinued if renal function worsens.  8.  History of mitral valve repair: Stable by echo in March 2022.  9.  Disposition: Follow-up in 1 week.  Murray Hodgkins, NP 08/27/2021, 9:08 PM

## 2021-08-27 NOTE — Patient Instructions (Addendum)
Medication Instructions:  - Your physician has recommended you make the following change in your medication:   1) DECREASE metolazone 2.5 mg: - take 1 tablet by mouth once a week on Wednesdays  2) CONTINUE torsemide 20 mg: - take 2 tablets (40 mg) by mouth once daily   *If you need a refill on your cardiac medications before your next appointment, please call your pharmacy*   Lab Work: - none ordered  If you have labs (blood work) drawn today and your tests are completely normal, you will receive your results only by: [place] (if you have MyChart) OR A paper copy in the mail If you have any lab test that is abnormal or we need to change your treatment, we will call you to review the results.   Testing/Procedures: - none ordered   Follow-Up: At [facility], you and your health needs are our priority.  As part of our continuing mission to provide you with exceptional heart care, we have created designated Provider Care Teams.  These Care Teams include your primary Cardiologist (physician) and Advanced Practice Providers (APPs -  Physician Assistants and Nurse Practitioners) who all work together to provide you with the care you need, when you need it.  We recommend signing up for the patient portal called "MyChart".  Sign up information is provided on this After Visit Summary.  MyChart is used to connect with patients for Virtual Visits (Telemedicine).  Patients are able to view lab/test results, encounter notes, upcoming appointments, etc.  Non-urgent messages can be sent to your provider as well.   To learn more about what you can do with MyChart, go to [email or web address].    Your next appointment:   Thursday 09/04/21 @ 2:50 pm   The format for your next appointment:   In Person  Provider:   Murray Hodgkins, NP    Other Instructions  1) Use ACE wraps on your legs for swelling

## 2021-08-27 NOTE — Telephone Encounter (Signed)
Patient had already taken his medications this morning when I called. He stated that he was feeling okay this morning and that he would come in for the appointment this afternoon.

## 2021-08-27 NOTE — Telephone Encounter (Signed)
With hypotension, hold: metolazone, jardiance, losartan, metoprolol, spironolactone, and torsemide this am. Will likely require ED eval if CHF worse, renal fxn worse, and hypotensive.

## 2021-08-27 NOTE — Telephone Encounter (Signed)
Advised patient of new arrival time on procedure day Feb 10 at 8:30 am. Verbalized understanding and agreement.

## 2021-08-27 NOTE — Telephone Encounter (Signed)
Called patient to inquire as to when he started his Metolazone as his Creatinine came back elevated at 2.51 from 1.66 a week prior.He stated he was able to pick it up on Sunday 08/24/21 so he had 3 doses prior to the lab yesterday. He stated that he has right leg has swelling much more than left leg. He also stated that his BP has been running in 70-80/40, and that he has been feeling dizzy.    Patient has already taken all medications this AM. He stated that he is feeling okay at this time. I was able to schedule him an appointment today with Murray Hodgkins at 2:25pm. Patient has a 1:00 apt with vein and vascular and will be coming from there.

## 2021-08-28 ENCOUNTER — Other Ambulatory Visit: Payer: Self-pay

## 2021-08-28 ENCOUNTER — Inpatient Hospital Stay
Admission: EM | Admit: 2021-08-28 | Discharge: 2021-08-30 | DRG: 256 | Disposition: A | Discharge status: Home / Self Care | Payer: 59 | Attending: Internal Medicine | Admitting: Internal Medicine

## 2021-08-28 DIAGNOSIS — Z794 Long term (current) use of insulin: Secondary | ICD-10-CM

## 2021-08-28 DIAGNOSIS — E1140 Type 2 diabetes mellitus with diabetic neuropathy, unspecified: Secondary | ICD-10-CM | POA: Diagnosis present

## 2021-08-28 DIAGNOSIS — E119 Type 2 diabetes mellitus without complications: Secondary | ICD-10-CM

## 2021-08-28 DIAGNOSIS — I2510 Atherosclerotic heart disease of native coronary artery without angina pectoris: Secondary | ICD-10-CM | POA: Diagnosis present

## 2021-08-28 DIAGNOSIS — Z888 Allergy status to other drugs, medicaments and biological substances status: Secondary | ICD-10-CM | POA: Diagnosis not present

## 2021-08-28 DIAGNOSIS — Z7982 Long term (current) use of aspirin: Secondary | ICD-10-CM | POA: Diagnosis not present

## 2021-08-28 DIAGNOSIS — I998 Other disorder of circulatory system: Secondary | ICD-10-CM | POA: Diagnosis present

## 2021-08-28 DIAGNOSIS — Z20822 Contact with and (suspected) exposure to covid-19: Secondary | ICD-10-CM | POA: Diagnosis present

## 2021-08-28 DIAGNOSIS — I739 Peripheral vascular disease, unspecified: Secondary | ICD-10-CM

## 2021-08-28 DIAGNOSIS — I471 Supraventricular tachycardia, unspecified: Secondary | ICD-10-CM | POA: Diagnosis present

## 2021-08-28 DIAGNOSIS — Z951 Presence of aortocoronary bypass graft: Secondary | ICD-10-CM | POA: Diagnosis not present

## 2021-08-28 DIAGNOSIS — N179 Acute kidney failure, unspecified: Secondary | ICD-10-CM | POA: Diagnosis present

## 2021-08-28 DIAGNOSIS — E785 Hyperlipidemia, unspecified: Secondary | ICD-10-CM | POA: Diagnosis present

## 2021-08-28 DIAGNOSIS — Z79899 Other long term (current) drug therapy: Secondary | ICD-10-CM

## 2021-08-28 DIAGNOSIS — I25810 Atherosclerosis of coronary artery bypass graft(s) without angina pectoris: Secondary | ICD-10-CM

## 2021-08-28 DIAGNOSIS — N1831 Chronic kidney disease, stage 3a: Secondary | ICD-10-CM | POA: Diagnosis present

## 2021-08-28 DIAGNOSIS — I7025 Atherosclerosis of native arteries of other extremities with ulceration: Secondary | ICD-10-CM

## 2021-08-28 DIAGNOSIS — E871 Hypo-osmolality and hyponatremia: Secondary | ICD-10-CM | POA: Diagnosis present

## 2021-08-28 DIAGNOSIS — E1152 Type 2 diabetes mellitus with diabetic peripheral angiopathy with gangrene: Principal | ICD-10-CM | POA: Diagnosis present

## 2021-08-28 DIAGNOSIS — E1169 Type 2 diabetes mellitus with other specified complication: Secondary | ICD-10-CM | POA: Diagnosis present

## 2021-08-28 DIAGNOSIS — M86171 Other acute osteomyelitis, right ankle and foot: Secondary | ICD-10-CM | POA: Diagnosis present

## 2021-08-28 DIAGNOSIS — E1122 Type 2 diabetes mellitus with diabetic chronic kidney disease: Secondary | ICD-10-CM

## 2021-08-28 DIAGNOSIS — I5042 Chronic combined systolic (congestive) and diastolic (congestive) heart failure: Secondary | ICD-10-CM

## 2021-08-28 DIAGNOSIS — I130 Hypertensive heart and chronic kidney disease with heart failure and stage 1 through stage 4 chronic kidney disease, or unspecified chronic kidney disease: Secondary | ICD-10-CM | POA: Diagnosis present

## 2021-08-28 DIAGNOSIS — E11621 Type 2 diabetes mellitus with foot ulcer: Secondary | ICD-10-CM | POA: Diagnosis present

## 2021-08-28 DIAGNOSIS — I10 Essential (primary) hypertension: Secondary | ICD-10-CM | POA: Diagnosis present

## 2021-08-28 DIAGNOSIS — Z8249 Family history of ischemic heart disease and other diseases of the circulatory system: Secondary | ICD-10-CM | POA: Diagnosis not present

## 2021-08-28 DIAGNOSIS — E7800 Pure hypercholesterolemia, unspecified: Secondary | ICD-10-CM | POA: Diagnosis present

## 2021-08-28 DIAGNOSIS — I252 Old myocardial infarction: Secondary | ICD-10-CM | POA: Diagnosis not present

## 2021-08-28 DIAGNOSIS — L97519 Non-pressure chronic ulcer of other part of right foot with unspecified severity: Secondary | ICD-10-CM | POA: Diagnosis present

## 2021-08-28 LAB — RESP PANEL BY RT-PCR (FLU A&B, COVID) ARPGX2
Influenza A by PCR: NEGATIVE
Influenza B by PCR: NEGATIVE
SARS Coronavirus 2 by RT PCR: NEGATIVE

## 2021-08-28 LAB — COMPREHENSIVE METABOLIC PANEL
ALT: 22 U/L (ref 0–44)
AST: 25 U/L (ref 15–41)
Albumin: 4.1 g/dL (ref 3.5–5.0)
Alkaline Phosphatase: 51 U/L (ref 38–126)
Anion gap: 14 (ref 5–15)
BUN: 71 mg/dL — ABNORMAL HIGH (ref 6–20)
CO2: 24 mmol/L (ref 22–32)
Calcium: 9.8 mg/dL (ref 8.9–10.3)
Chloride: 94 mmol/L — ABNORMAL LOW (ref 98–111)
Creatinine, Ser: 2.11 mg/dL — ABNORMAL HIGH (ref 0.61–1.24)
GFR, Estimated: 35 mL/min — ABNORMAL LOW (ref >60)
Glucose, Bld: 160 mg/dL — ABNORMAL HIGH (ref 70–99)
Potassium: 5 mmol/L (ref 3.5–5.1)
Sodium: 132 mmol/L — ABNORMAL LOW (ref 135–145)
Total Bilirubin: 0.6 mg/dL (ref 0.3–1.2)
Total Protein: 8.2 g/dL — ABNORMAL HIGH (ref 6.5–8.1)

## 2021-08-28 LAB — CBC WITH DIFFERENTIAL/PLATELET
Abs Immature Granulocytes: 0.17 10*3/uL — ABNORMAL HIGH (ref 0.00–0.07)
Basophils Absolute: 0.1 10*3/uL (ref 0.0–0.1)
Basophils Relative: 1 %
Eosinophils Absolute: 0.2 10*3/uL (ref 0.0–0.5)
Eosinophils Relative: 1 %
HCT: 40.8 % (ref 39.0–52.0)
Hemoglobin: 13.5 g/dL (ref 13.0–17.0)
Immature Granulocytes: 1 %
Lymphocytes Relative: 20 %
Lymphs Abs: 2.6 10*3/uL (ref 0.7–4.0)
MCH: 29.2 pg (ref 26.0–34.0)
MCHC: 33.1 g/dL (ref 30.0–36.0)
MCV: 88.3 fL (ref 80.0–100.0)
Monocytes Absolute: 0.7 10*3/uL (ref 0.1–1.0)
Monocytes Relative: 6 %
Neutro Abs: 9 10*3/uL — ABNORMAL HIGH (ref 1.7–7.7)
Neutrophils Relative %: 71 %
Platelets: 311 10*3/uL (ref 150–400)
RBC: 4.62 MIL/uL (ref 4.22–5.81)
RDW: 12.5 % (ref 11.5–15.5)
WBC: 12.8 10*3/uL — ABNORMAL HIGH (ref 4.0–10.5)
nRBC: 0 % (ref 0.0–0.2)

## 2021-08-28 LAB — GLUCOSE, CAPILLARY: Glucose-Capillary: 133 mg/dL — ABNORMAL HIGH (ref 70–99)

## 2021-08-28 LAB — SURGICAL PCR SCREEN
MRSA, PCR: NEGATIVE
Staphylococcus aureus: NEGATIVE

## 2021-08-28 LAB — CBG MONITORING, ED: Glucose-Capillary: 167 mg/dL — ABNORMAL HIGH (ref 70–99)

## 2021-08-28 MED ORDER — HEPARIN SODIUM (PORCINE) 5000 UNIT/ML IJ SOLN
5000.0000 [IU] | Freq: Three times a day (TID) | INTRAMUSCULAR | Status: DC
  Administered 2021-08-28 – 2021-08-30 (×5): 5000 [IU] via SUBCUTANEOUS
  Filled 2021-08-28 (×5): qty 1

## 2021-08-28 MED ORDER — INSULIN GLARGINE-YFGN 100 UNIT/ML ~~LOC~~ SOLN
10.0000 [IU] | Freq: Every day | SUBCUTANEOUS | Status: DC
  Administered 2021-08-28 – 2021-08-29 (×2): 10 [IU] via SUBCUTANEOUS
  Filled 2021-08-28 (×3): qty 0.1

## 2021-08-28 MED ORDER — ACETAMINOPHEN 325 MG PO TABS
650.0000 mg | ORAL_TABLET | Freq: Four times a day (QID) | ORAL | Status: DC | PRN
  Administered 2021-08-29: 650 mg via ORAL
  Filled 2021-08-28: qty 2

## 2021-08-28 MED ORDER — DOXYCYCLINE HYCLATE 100 MG PO TABS
100.0000 mg | ORAL_TABLET | Freq: Two times a day (BID) | ORAL | Status: DC
  Administered 2021-08-28 – 2021-08-30 (×4): 100 mg via ORAL
  Filled 2021-08-28 (×4): qty 1

## 2021-08-28 MED ORDER — INSULIN ASPART 100 UNIT/ML IJ SOLN
0.0000 [IU] | Freq: Three times a day (TID) | INTRAMUSCULAR | Status: DC
  Administered 2021-08-28: 18:00:00 2 [IU] via SUBCUTANEOUS
  Administered 2021-08-30: 3 [IU] via SUBCUTANEOUS
  Administered 2021-08-30 (×2): 2 [IU] via SUBCUTANEOUS
  Filled 2021-08-28 (×4): qty 1

## 2021-08-28 MED ORDER — ONDANSETRON HCL 4 MG PO TABS: 4.0000 mg | ORAL_TABLET | Freq: Four times a day (QID) | ORAL | Status: DC | PRN

## 2021-08-28 MED ORDER — ONDANSETRON HCL 4 MG/2ML IJ SOLN: 4.0000 mg | Freq: Four times a day (QID) | INTRAMUSCULAR | Status: DC | PRN

## 2021-08-28 MED ORDER — ROSUVASTATIN CALCIUM 10 MG PO TABS
20.0000 mg | ORAL_TABLET | Freq: Every day | ORAL | Status: DC
  Administered 2021-08-28 – 2021-08-29 (×2): 20 mg via ORAL
  Filled 2021-08-28: qty 1
  Filled 2021-08-28 (×2): qty 2

## 2021-08-28 MED ORDER — METOPROLOL SUCCINATE ER 50 MG PO TB24
50.0000 mg | ORAL_TABLET | Freq: Every day | ORAL | Status: DC
  Administered 2021-08-28 – 2021-08-30 (×2): 50 mg via ORAL
  Filled 2021-08-28 (×3): qty 1

## 2021-08-28 MED ORDER — ACETAMINOPHEN 650 MG RE SUPP: 650.0000 mg | Freq: Four times a day (QID) | RECTAL | Status: DC | PRN

## 2021-08-28 MED ORDER — INSULIN ASPART 100 UNIT/ML IJ SOLN
0.0000 [IU] | Freq: Every day | INTRAMUSCULAR | Status: DC
  Administered 2021-08-29: 2 [IU] via SUBCUTANEOUS
  Filled 2021-08-28: qty 1

## 2021-08-28 MED ORDER — PRAMIPEXOLE DIHYDROCHLORIDE 1 MG PO TABS
1.5000 mg | ORAL_TABLET | Freq: Two times a day (BID) | ORAL | Status: DC
  Administered 2021-08-28 – 2021-08-30 (×3): 1.5 mg via ORAL
  Filled 2021-08-28 (×5): qty 2

## 2021-08-28 NOTE — H&P (View-Only) (Signed)
ORTHOPAEDIC CONSULTATION  REQUESTING PHYSICIAN: Kristopher Oppenheim, DO  Chief Complaint: Right second toe gangrene  HPI: Ricardo Brown is a 60 y.o. male who complains of right second toe gangrenous changes.  Has been seen in the outpatient clinic.  He had ABIs performed at Dr. Bunnie Domino office yesterday.  Presents today with gangrenous changes of his right second toe secondary with an open ulceration.  He has diabetes with neuropathy.  Past Medical History:  Diagnosis Date   Chronic combined systolic (congestive) and diastolic (congestive) heart failure ([place])    a. 01/2018 EF 35-40%; b. 10/2020 Echo: EF 40-45%, glob HK. RVSP 42.75mmHg. Mildly dil LA. Mild MS/AoV sclerosis.   Coronary artery disease    a. 01/2018 late presenting inferior MI; b. 01/2018 Cath: Severe multivessel dzs-->CABG x 3 (LIMA->LAD, VG->OM2, VG->RPDA @ [facility]).   Diabetes mellitus without complication ([place])    a. Dx ~ 2000.   Heart palpitations    a. Pt reports prior nl echo's and stress tests. Last stress test w/in past 2 yrs - PCP.   High cholesterol    Hypertension    Mitral regurgitation    a. 01/2018 s/p MV repair @ time of CABG.   Myocardial infarction [facility])    Postoperative atrial fibrillation    a. 01/2018 @ time of CABG.   PSVT (paroxysmal supraventricular tachycardia) ([place])    a. Very symptomatic with multiple ED evaluations.  Has been on amio.   STEMI (ST elevation myocardial infarction) ([place]) 01/24/2018   Past Surgical History:  Procedure Laterality Date   CARDIAC CATHETERIZATION     CARDIAC VALVE REPLACEMENT     Mitral Valve Repair   COLONOSCOPY WITH PROPOFOL N/A 06/07/2019   Procedure: COLONOSCOPY WITH PROPOFOL;  Surgeon: [name], MD;  Location: [facility] ENDOSCOPY;  Service: Endoscopy;  Laterality: N/A;   COLONOSCOPY WITH PROPOFOL N/A 02/07/2020   Procedure: COLONOSCOPY WITH PROPOFOL;  Surgeon: [name], MD;  Location: [facility] ENDOSCOPY;  Service: Endoscopy;  Laterality: N/A;   COLONOSCOPY  WITH PROPOFOL N/A 12/11/2020   Procedure: COLONOSCOPY WITH PROPOFOL;  Surgeon: [name], MD;  Location: [facility] ENDOSCOPY;  Service: Endoscopy;  Laterality: N/A;   CORONARY ANGIOPLASTY     CORONARY ARTERY BYPASS GRAFT     x 3   01/2018   ESOPHAGOGASTRODUODENOSCOPY N/A 02/07/2020   Procedure: ESOPHAGOGASTRODUODENOSCOPY (EGD);  Surgeon: [name], MD;  Location: [facility] ENDOSCOPY;  Service: Endoscopy;  Laterality: N/A;   LEFT HEART CATH AND CORONARY ANGIOGRAPHY N/A 01/24/2018   Procedure: LEFT HEART CATH AND CORONARY ANGIOGRAPHY;  Surgeon: [name], MD;  Location: [place] CV LAB;  Service: Cardiovascular;  Laterality: N/A;   TONSILLECTOMY     Social History   Socioeconomic History   Marital status: Married    Spouse name: Not on file   Number of children: Not on file   Years of education: Not on file   Highest education level: Not on file  Occupational History   Not on file  Tobacco Use   Smoking status: Never   Smokeless tobacco: Never  Vaping Use   Vaping Use: Never used  Substance and Sexual Activity   Alcohol use: Not Currently    Comment: rare beer   Drug use: No   Sexual activity: Not on file  Other Topics Concern   Not on file  Social History Narrative   Lives locally with wife.  Works in [employer].  Does not routinely exercise.   Social Determinants of Health  Financial Resource Strain: Not on file  Food Insecurity: Not on file  Transportation Needs: Not on file  Physical Activity: Not on file  Stress: Not on file  Social Connections: Not on file   Family History  Problem Relation Age of Onset   Cancer Mother        died @ 60   CAD Father        First MI @ 60. S/p heart transplant. Died in mid-50's of cancer.   Cancer Father    Other Brother        alive and well   Allergies  Allergen Reactions   Entresto [Sacubitril-Valsartan] Other (See Comments)    SVT   Lipitor [Atorvastatin] Palpitations    SVT   Pregabalin Other  (See Comments)   Prior to Admission medications   Medication Sig Start Date End Date Taking? Authorizing Provider  aspirin 81 MG EC tablet Take 81 mg by mouth daily.     [provider]  b complex vitamins tablet Take 1 tablet by mouth daily.     [provider]  doxycycline (VIBRA-TABS) 100 MG tablet Take 100 mg by mouth 2 (two) times daily. 08/25/21   [provider]  Dulaglutide 1.5 MG/0.5ML SOPN Inject into the skin once a week.  03/30/18   [provider]  empagliflozin (JARDIANCE) 25 MG TABS tablet Take 25 mg by mouth daily.    [provider]  gabapentin (NEURONTIN) 100 MG capsule Take 1 capsule (100 mg total) by mouth 2 (two) times daily. 08/05/21   [name], MD  losartan (COZAAR) 100 MG tablet Take 1 tablet (100 mg total) by mouth daily. 09/02/20   [name], MD  metFORMIN (GLUCOPHAGE) 500 MG tablet Take 2 tablets (1,000 mg total) by mouth 2 (two) times daily. 06/18/21   [name], MD  metolazone (ZAROXOLYN) 2.5 MG tablet Take 1 tablet (2.5 mg) by mouth once a week on Wednesdays. Take 30 mins prior to your Torsemide. 08/27/21   Theora Gianotti, NP  metoprolol succinate (TOPROL-XL) 50 MG 24 hr tablet Take 1 tablet (50 mg total) by mouth daily. Take with or immediately following a meal. 05/27/21   [name], MD  Omega-3 Fatty Acids (FISH OIL) 875 MG CAPS Take 1 capsule by mouth 2 (two) times daily.    [provider]  Pancrelipase, Lip-Prot-Amyl, (ZENPEP) 40000-126000 units CPEP Take 80,000 Units by mouth 3 (three) times daily with meals AND 40,000 Units with snacks. 08/08/21   [name], MD  pramipexole (MIRAPEX) 1.5 MG tablet Take 1 tablet (1.5 mg total) by mouth 2 (two) times daily. 05/21/21   [name], MD  rosuvastatin (CRESTOR) 20 MG tablet Take 1 tablet (20 mg total) by mouth daily. 09/02/20   [name], MD  spironolactone (ALDACTONE) 25 MG tablet Take 1 tablet (25 mg total) by mouth  daily. 08/25/21   [name], MD  torsemide (DEMADEX) 20 MG tablet Take 2 tablets (40 mg total) by mouth daily. 40 mg daily 07/14/21   [name], MD  TRESIBA FLEXTOUCH 200 UNIT/ML FlexTouch Pen 14 Units daily. 08/26/21   [provider]   No results found.  Positive ROS: All other systems have been reviewed and were otherwise negative with the exception of those mentioned in the HPI and as above.  12 point ROS was performed.  Physical Exam: General: Alert and oriented.  No apparent distress.  Vascular:  Left foot:Dorsalis Pedis:  present Posterior Tibial:  diminished  Right foot: Dorsalis Pedis:  present Posterior Tibial:  absent.  He does have edema to the right ankle.  Capillary fill time is brisk.  Foot is warm.  Neuro:absent protective sensation  Derm: Right second toe from the PIPJ distally has necrosis.  There is an open wound with exposed bone at the DIPJ region.  See clinical picture     Ortho/MS: Edema to the right foot and ankle.  Assessment: Osteomyelitis right second toe Diabetic foot ulceration with necrosis to bone right foot  Plan: Patient will obviously need amputation of the right second toe.  I have reached out to vascular surgery for their opinion in regards to performing an angio prior to amputation.  ABIs performed in the office yesterday showed a toe pressure on the right foot at 0.6.  ABIs were above 1.  Discussed with the patient surgical intervention for amputation of the toe and he is well aware of the plan going forward.  Addendum:  D/W Dr. Lucky Cowboy (vascular) and waveforms are acceptable to proceed with amputation.  Consult vascular if bleeding is not adequate for healing or complications arise post op.  Will d/w Dr. Cleda Mccreedy to plan amputation tomorrow.    Elesa Hacker, DPM Cell [phone]   08/28/2021 12:37 PM

## 2021-08-28 NOTE — ED Notes (Signed)
ED TO INPATIENT HANDOFF REPORT  ED Nurse Name and Phone #: Baxter Flattery, RN  S Name/Age/Gender Ricardo Brown 61 y.o. male Room/Bed: ED07A/ED07A  Code Status   Code Status: Full Code  Home/SNF/Other Home Patient oriented to: self, place, time, and situation Is this baseline? Yes   Triage Complete: Triage complete  Chief Complaint Ischemic toe [I99.8]  Triage Note Pt states Dr Luana Shu called and told him to come to the ED for surgical amputation of  his right 2nd toe, pt has discoloration/wound to the toe, had vascular US yesterday   Allergies Allergies  Allergen Reactions   Entresto [Sacubitril-Valsartan] Other (See Comments)    SVT   Lipitor [Atorvastatin] Palpitations    SVT   Pregabalin Other (See Comments)    Level of Care/Admitting Diagnosis ED Disposition     ED Disposition  Admit   Condition  --   [place]: [place] [[id number]]  Level of Care: Med-Surg [16]  Covid Evaluation: Asymptomatic Screening Protocol (No Symptoms)  Diagnosis: Ischemic toe [671245]  Admitting Physician: Bridgett Larsson [place]  Attending Physician: Bridgett Larsson, ERIC [[id number]]  Estimated length of stay: past midnight tomorrow  Certification:: I certify this patient will need inpatient services for at least 2 midnights          B Medical/Surgery History Past Medical History:  Diagnosis Date   Chronic combined systolic (congestive) and diastolic (congestive) heart failure ([place])    a. 01/2018 EF 35-40%; b. 10/2020 Echo: EF 40-45%, glob HK. RVSP 42.67mmHg. Mildly dil LA. Mild MS/AoV sclerosis.   Coronary artery disease    a. 01/2018 late presenting inferior MI; b. 01/2018 Cath: Severe multivessel dzs-->CABG x 3 (LIMA->LAD, VG->OM2, VG->RPDA @ [facility]).   Diabetes mellitus without complication ([place])    a. Dx ~ 2000.   Heart palpitations    a. Pt reports prior nl echo's and stress tests. Last stress test w/in past 2 yrs - PCP.   High cholesterol    Hypertension     Mitral regurgitation    a. 01/2018 s/p MV repair @ time of CABG.   Myocardial infarction [facility])    Postoperative atrial fibrillation    a. 01/2018 @ time of CABG.   PSVT (paroxysmal supraventricular tachycardia) ([place])    a. Very symptomatic with multiple ED evaluations.  Has been on amio.   STEMI (ST elevation myocardial infarction) ([place]) 01/24/2018   Past Surgical History:  Procedure Laterality Date   CARDIAC CATHETERIZATION     CARDIAC VALVE REPLACEMENT     Mitral Valve Repair   COLONOSCOPY WITH PROPOFOL N/A 06/07/2019   Procedure: COLONOSCOPY WITH PROPOFOL;  Surgeon: [name], MD;  Location: [facility] ENDOSCOPY;  Service: Endoscopy;  Laterality: N/A;   COLONOSCOPY WITH PROPOFOL N/A 02/07/2020   Procedure: COLONOSCOPY WITH PROPOFOL;  Surgeon: [name], MD;  Location: [facility] ENDOSCOPY;  Service: Endoscopy;  Laterality: N/A;   COLONOSCOPY WITH PROPOFOL N/A 12/11/2020   Procedure: COLONOSCOPY WITH PROPOFOL;  Surgeon: [name], MD;  Location: [facility] ENDOSCOPY;  Service: Endoscopy;  Laterality: N/A;   CORONARY ANGIOPLASTY     CORONARY ARTERY BYPASS GRAFT     x 3   01/2018   ESOPHAGOGASTRODUODENOSCOPY N/A 02/07/2020   Procedure: ESOPHAGOGASTRODUODENOSCOPY (EGD);  Surgeon: [name], MD;  Location: [facility] ENDOSCOPY;  Service: Endoscopy;  Laterality: N/A;   LEFT HEART CATH AND CORONARY ANGIOGRAPHY N/A 01/24/2018   Procedure: LEFT HEART CATH AND CORONARY ANGIOGRAPHY;  Surgeon: [name], MD;  Location:  [place] CV LAB;  Service: Cardiovascular;  Laterality: N/A;   TONSILLECTOMY       A IV Location/Drains/Wounds Patient Lines/Drains/Airways Status     Active Line/Drains/Airways     Name Placement date Placement time Site Days   Peripheral IV 08/28/21 20 G 1" Anterior;Left Forearm 08/28/21  1223  Forearm  less than 1   Airway 02/07/20  0929  -- 568            Intake/Output Last 24 hours No intake or output data in the 24 hours ending  08/28/21 1643  Labs/Imaging Results for orders placed or performed during the hospital encounter of 08/28/21 (from the past 48 hour(s))  CBC with Differential     Status: Abnormal   Collection Time: 08/28/21 11:06 AM  Result Value Ref Range   WBC 12.8 (H) 4.0 - 10.5 K/uL   RBC 4.62 4.22 - 5.81 MIL/uL   Hemoglobin 13.5 13.0 - 17.0 g/dL   HCT 40.8 39.0 - 52.0 %   MCV 88.3 80.0 - 100.0 fL   MCH 29.2 26.0 - 34.0 pg   MCHC 33.1 30.0 - 36.0 g/dL   RDW 12.5 11.5 - 15.5 %   Platelets 311 150 - 400 K/uL   nRBC 0.0 0.0 - 0.2 %   Neutrophils Relative % 71 %   Neutro Abs 9.0 (H) 1.7 - 7.7 K/uL   Lymphocytes Relative 20 %   Lymphs Abs 2.6 0.7 - 4.0 K/uL   Monocytes Relative 6 %   Monocytes Absolute 0.7 0.1 - 1.0 K/uL   Eosinophils Relative 1 %   Eosinophils Absolute 0.2 0.0 - 0.5 K/uL   Basophils Relative 1 %   Basophils Absolute 0.1 0.0 - 0.1 K/uL   Immature Granulocytes 1 %   Abs Immature Granulocytes 0.17 (H) 0.00 - 0.07 K/uL    Comment: Performed at [facility], [address] [place], [place] [address]  Comprehensive metabolic panel     Status: Abnormal   Collection Time: 08/28/21 11:06 AM  Result Value Ref Range   Sodium 132 (L) 135 - 145 mmol/L   Potassium 5.0 3.5 - 5.1 mmol/L   Chloride 94 (L) 98 - 111 mmol/L   CO2 24 22 - 32 mmol/L   Glucose, Bld 160 (H) 70 - 99 mg/dL    Comment: Glucose reference range applies only to samples taken after fasting for at least 8 hours.   BUN 71 (H) 6 - 20 mg/dL   Creatinine, Ser 2.11 (H) 0.61 - 1.24 mg/dL   Calcium 9.8 8.9 - 10.3 mg/dL   Total Protein 8.2 (H) 6.5 - 8.1 g/dL   Albumin 4.1 3.5 - 5.0 g/dL   AST 25 15 - 41 U/L   ALT 22 0 - 44 U/L   Alkaline Phosphatase 51 38 - 126 U/L   Total Bilirubin 0.6 0.3 - 1.2 mg/dL   GFR, Estimated 35 (L) >60 mL/min    Comment: (NOTE) Calculated using the CKD-EPI Creatinine Equation (2021)    Anion gap 14 5 - 15    Comment: Performed at [facility], [address],  [place], [place] [address]  Resp Panel by RT-PCR (Flu A&B, Covid) Nasopharyngeal Swab     Status: None   Collection Time: 08/28/21 12:13 PM   Specimen: Nasopharyngeal Swab; Nasopharyngeal(NP) swabs in vial transport medium  Result Value Ref Range   SARS Coronavirus 2 by RT PCR NEGATIVE NEGATIVE    Comment: (NOTE) SARS-CoV-2 target nucleic acids are NOT  DETECTED.  The SARS-CoV-2 RNA is generally detectable in upper respiratory specimens during the acute phase of infection. The lowest concentration of SARS-CoV-2 viral copies this assay can detect is 138 copies/mL. A negative result does not preclude SARS-Cov-2 infection and should not be used as the sole basis for treatment or other patient management decisions. A negative result may occur with  improper specimen collection/handling, submission of specimen other than nasopharyngeal swab, presence of viral mutation(s) within the areas targeted by this assay, and inadequate number of viral copies(<138 copies/mL). A negative result must be combined with clinical observations, patient history, and epidemiological information. The expected result is Negative.  Fact Sheet for Patients:  EntrepreneurPulse.com.au  Fact Sheet for Healthcare Providers:  IncredibleEmployment.be  This test is no t yet approved or cleared by the [place] FDA and  has been authorized for detection and/or diagnosis of SARS-CoV-2 by FDA under an Emergency Use Authorization (EUA). This EUA will remain  in effect (meaning this test can be used) for the duration of the COVID-19 declaration under Section 564(b)(1) of the Act, 21 U.S.C.section 360bbb-3(b)(1), unless the authorization is terminated  or revoked sooner.       Influenza A by PCR NEGATIVE NEGATIVE   Influenza B by PCR NEGATIVE NEGATIVE    Comment: (NOTE) The Xpert Xpress SARS-CoV-2/FLU/RSV plus assay is intended as an aid in the diagnosis of influenza from  Nasopharyngeal swab specimens and should not be used as a sole basis for treatment. Nasal washings and aspirates are unacceptable for Xpert Xpress SARS-CoV-2/FLU/RSV testing.  Fact Sheet for Patients: EntrepreneurPulse.com.au  Fact Sheet for Healthcare Providers: IncredibleEmployment.be  This test is not yet approved or cleared by the [place] FDA and has been authorized for detection and/or diagnosis of SARS-CoV-2 by FDA under an Emergency Use Authorization (EUA). This EUA will remain in effect (meaning this test can be used) for the duration of the COVID-19 declaration under Section 564(b)(1) of the Act, 21 U.S.C. section 360bbb-3(b)(1), unless the authorization is terminated or revoked.  Performed at [facility], [address] [place], [place] [address]    No results found.  Pending Labs Unresulted Labs (From admission, onward)     Start     Ordered   08/29/21 0500  CBC with Differential/Platelet  Tomorrow morning,   STAT        08/28/21 1428   08/29/21 0500  Comprehensive metabolic panel  Tomorrow morning,   STAT        08/28/21 1428   08/29/21 0500  Magnesium  Tomorrow morning,   STAT        08/28/21 1428   08/28/21 1429  Hemoglobin A1c  Once,   STAT       Comments: To assess prior glycemic control    08/28/21 1428   08/28/21 1429  HIV Antibody (routine testing w rflx)  (HIV Antibody (Routine testing w reflex) panel)  Add-on,   AD        08/28/21 1428            Vitals/Pain Today's Vitals   08/28/21 1200 08/28/21 1300 08/28/21 1430 08/28/21 1530  BP: 109/67 101/63 (!) 103/57 (!) 106/59  Pulse: 73 66 66 73  Resp: 18 18 18 18   Temp:      TempSrc:      SpO2: 100% 99% 100% 99%  Weight:      Height:        Isolation Precautions No active isolations  Medications Medications  doxycycline (VIBRA-TABS)  tablet 100 mg (100 mg Oral Given 08/28/21 1212)  metoprolol succinate (TOPROL-XL) 24 hr tablet 50 mg  (has no administration in time range)  rosuvastatin (CRESTOR) tablet 20 mg (has no administration in time range)  pramipexole (MIRAPEX) tablet 1.5 mg (has no administration in time range)  insulin glargine-yfgn (SEMGLEE) injection 10 Units (has no administration in time range)  insulin aspart (novoLOG) injection 0-9 Units (has no administration in time range)  insulin aspart (novoLOG) injection 0-5 Units (has no administration in time range)  heparin injection 5,000 Units (has no administration in time range)  acetaminophen (TYLENOL) tablet 650 mg (has no administration in time range)    Or  acetaminophen (TYLENOL) suppository 650 mg (has no administration in time range)  ondansetron (ZOFRAN) tablet 4 mg (has no administration in time range)    Or  ondansetron (ZOFRAN) injection 4 mg (has no administration in time range)    Mobility walks Low fall risk   Focused Assessments Pulmonary Assessment Handoff:  Lung sounds:   O2 Device: Room Air      R Recommendations: See Admitting Provider Note  Report given to:   Additional Notes:

## 2021-08-28 NOTE — Assessment & Plan Note (Signed)
Pt states he is scheduled for SVT ablation next month. Recently stopped amiodarone per cardiology recs.

## 2021-08-28 NOTE — Subjective & Objective (Addendum)
CC: ischemic right 2nd toe HPI: 61 year old white male with a history of hypertension, type 2 diabetes, combined chronic systolic and diastolic heart failure, coronary disease status post CABG, history of SVT who presents to the ER today with worsening ischemia of his right second toe.  Ischemia is been going on for several weeks now.  Was seen in vascular clinic yesterday.  He has had increasing erythema.  The skin is started to peel away from the toe due to the erythema.  Patient was seen by Dr. Luana Shu with podiatry office.  Patient was told that he needs his second toe amputated.  Presented to the ER for evaluation.  EDP has been in contact with podiatry who wanted the patient admitted for possible surgery tomorrow.  Patient denies any fever chills.  No chest pain.  No nausea no vomiting.  Patient has a history of SVT.  Was recently seen by cardiology.  He was taken off his amiodarone.  His SVT ablation procedure scheduled for next month.  [facility] hospitalist contacted for admission.

## 2021-08-28 NOTE — Assessment & Plan Note (Signed)
Continue hold htn meds.

## 2021-08-28 NOTE — Assessment & Plan Note (Signed)
On asa, toprol, crestor.

## 2021-08-28 NOTE — Assessment & Plan Note (Addendum)
Admit to medical bed. Management per podiatry. Will likely need right 2nd toe amputation. Defer antibiotic management to podiatry. Pt without any sensation in his foot due to diabetic neuropathy. Prn tylenol for pain.

## 2021-08-28 NOTE — H&P (Signed)
History and Physical    Ricardo Brown SFK:[id number] DOB: 11-29-1960 DOA: 08/28/2021  PCP: Ricardo Athens, MD   Patient coming from: Home  I have personally briefly reviewed patient's old medical records in [place]  CC: ischemic right 2nd toe HPI: 61 year old white male with a history of hypertension, type 2 diabetes, combined chronic systolic and diastolic heart failure, coronary disease status post CABG, history of SVT who presents to the ER today with worsening ischemia of his right second toe.  Ischemia is been going on for several weeks now.  Was seen in vascular clinic yesterday.  He has had increasing erythema.  The skin is started to peel away from the toe due to the erythema.  Patient was seen by Dr. Luana Shu with podiatry office.  Patient was told that he needs his second toe amputated.  Presented to the ER for evaluation.  EDP has been in contact with podiatry who wanted the patient admitted for possible surgery tomorrow.  Patient denies any fever chills.  No chest pain.  No nausea no vomiting.  Patient has a history of SVT.  Was recently seen by cardiology.  He was taken off his amiodarone.  His SVT ablation procedure scheduled for next month.  [facility] hospitalist contacted for admission.   ED Course: Labs Scr 2.1, BUN 71.   Review of Systems:  Review of Systems  Constitutional:  Negative for chills and fever.  HENT: Negative.  Negative for ear pain, hearing loss and tinnitus.   Eyes: Negative.  Negative for blurred vision, double vision and photophobia.  Respiratory: Negative.  Negative for cough, hemoptysis and sputum production.   Cardiovascular: Negative.  Negative for chest pain, palpitations and orthopnea.  Gastrointestinal: Negative.  Negative for heartburn, nausea and vomiting.  Genitourinary: Negative.  Negative for dysuria, frequency and urgency.  Musculoskeletal: Negative.  Negative for back pain, myalgias and neck pain.  Skin:        Increasing  sloughing of skin of right 2nd toe.  Neurological:        No pain in toe or feet due to neuropathy  Endo/Heme/Allergies: Negative.   Psychiatric/Behavioral: Negative.    All other systems reviewed and are negative.  Past Medical History:  Diagnosis Date   Chronic combined systolic (congestive) and diastolic (congestive) heart failure ([place])    a. 01/2018 EF 35-40%; b. 10/2020 Echo: EF 40-45%, glob HK. RVSP 42.6mmHg. Mildly dil LA. Mild MS/AoV sclerosis.   Coronary artery disease    a. 01/2018 late presenting inferior MI; b. 01/2018 Cath: Severe multivessel dzs-->CABG x 3 (LIMA->LAD, VG->OM2, VG->RPDA @ [facility]).   Diabetes mellitus without complication ([place])    a. Dx ~ 2000.   Heart palpitations    a. Pt reports prior nl echo's and stress tests. Last stress test w/in past 2 yrs - PCP.   High cholesterol    Hypertension    Mitral regurgitation    a. 01/2018 s/p MV repair @ time of CABG.   Myocardial infarction [facility])    Postoperative atrial fibrillation    a. 01/2018 @ time of CABG.   PSVT (paroxysmal supraventricular tachycardia) ([place])    a. Very symptomatic with multiple ED evaluations.  Has been on amio.   STEMI (ST elevation myocardial infarction) ([place]) 01/24/2018    Past Surgical History:  Procedure Laterality Date   CARDIAC CATHETERIZATION     CARDIAC VALVE REPLACEMENT     Mitral Valve Repair   COLONOSCOPY WITH PROPOFOL N/A 06/07/2019   Procedure:  COLONOSCOPY WITH PROPOFOL;  Surgeon: Ricardo Manifold, MD;  Location: [facility] ENDOSCOPY;  Service: Endoscopy;  Laterality: N/A;   COLONOSCOPY WITH PROPOFOL N/A 02/07/2020   Procedure: COLONOSCOPY WITH PROPOFOL;  Surgeon: Ricardo Manifold, MD;  Location: [facility] ENDOSCOPY;  Service: Endoscopy;  Laterality: N/A;   COLONOSCOPY WITH PROPOFOL N/A 12/11/2020   Procedure: COLONOSCOPY WITH PROPOFOL;  Surgeon: Ricardo Manifold, MD;  Location: [facility] ENDOSCOPY;  Service: Endoscopy;  Laterality: N/A;   CORONARY ANGIOPLASTY     CORONARY ARTERY BYPASS  GRAFT     x 3   01/2018   ESOPHAGOGASTRODUODENOSCOPY N/A 02/07/2020   Procedure: ESOPHAGOGASTRODUODENOSCOPY (EGD);  Surgeon: Ricardo Manifold, MD;  Location: [facility] ENDOSCOPY;  Service: Endoscopy;  Laterality: N/A;   LEFT HEART CATH AND CORONARY ANGIOGRAPHY N/A 01/24/2018   Procedure: LEFT HEART CATH AND CORONARY ANGIOGRAPHY;  Surgeon: [name], MD;  Location: [place] CV LAB;  Service: Cardiovascular;  Laterality: N/A;   TONSILLECTOMY       reports that he has never smoked. He has never used smokeless tobacco. He reports that he does not currently use alcohol. He reports that he does not use drugs.  Allergies  Allergen Reactions   Entresto [Sacubitril-Valsartan] Other (See Comments)    SVT   Lipitor [Atorvastatin] Palpitations    SVT   Pregabalin Other (See Comments)    Family History  Problem Relation Age of Onset   Cancer Mother        died @ 26   CAD Father        First MI @ 45. S/p heart transplant. Died in mid-50's of cancer.   Cancer Father    Other Brother        alive and well    Prior to Admission medications   Medication Sig Start Date End Date Taking? Authorizing Provider  aspirin 81 MG EC tablet Take 81 mg by mouth daily.     [provider]  b complex vitamins tablet Take 1 tablet by mouth daily.     [provider]  doxycycline (VIBRA-TABS) 100 MG tablet Take 100 mg by mouth 2 (two) times daily. 08/25/21   [provider]  Dulaglutide 1.5 MG/0.5ML SOPN Inject into the skin once a week.  03/30/18   [provider]  empagliflozin (JARDIANCE) 25 MG TABS tablet Take 25 mg by mouth daily.    [provider]  gabapentin (NEURONTIN) 100 MG capsule Take 1 capsule (100 mg total) by mouth 2 (two) times daily. 08/05/21   Ricardo Athens, MD  losartan (COZAAR) 100 MG tablet Take 1 tablet (100 mg total) by mouth daily. 09/02/20   [name], MD  metFORMIN (GLUCOPHAGE) 500 MG tablet Take 2 tablets (1,000 mg total) by  mouth 2 (two) times daily. 06/18/21   Ricardo Athens, MD  metolazone (ZAROXOLYN) 2.5 MG tablet Take 1 tablet (2.5 mg) by mouth once a week on Wednesdays. Take 30 mins prior to your Torsemide. 08/27/21   Theora Gianotti, NP  metoprolol succinate (TOPROL-XL) 50 MG 24 hr tablet Take 1 tablet (50 mg total) by mouth daily. Take with or immediately following a meal. 05/27/21   Ricardo Athens, MD  Omega-3 Fatty Acids (FISH OIL) 875 MG CAPS Take 1 capsule by mouth 2 (two) times daily.    [provider]  Pancrelipase, Lip-Prot-Amyl, (ZENPEP) 40000-126000 units CPEP Take 80,000 Units by mouth 3 (three) times daily with meals AND 40,000 Units with snacks. 08/08/21   Ricardo Manifold, MD  pramipexole (MIRAPEX) 1.5 MG tablet Take 1 tablet (1.5 mg total) by mouth 2 (two) times daily. 05/21/21   Ricardo Athens, MD  rosuvastatin (CRESTOR) 20 MG tablet Take 1 tablet (20 mg total) by mouth daily. 09/02/20   [name], MD  spironolactone (ALDACTONE) 25 MG tablet Take 1 tablet (25 mg total) by mouth daily. 08/25/21   [name], MD  torsemide (DEMADEX) 20 MG tablet Take 2 tablets (40 mg total) by mouth daily. 40 mg daily 07/14/21   [name], MD  TRESIBA FLEXTOUCH 200 UNIT/ML FlexTouch Pen 14 Units daily. 08/26/21   [provider]    Physical Exam: Vitals:   08/28/21 1103 08/28/21 1104 08/28/21 1200  BP: (!) 104/59  109/67  Pulse: 81  73  Resp: 18  18  Temp: 98.1 F (36.7 C)    TempSrc: Oral    SpO2: 98%  100%  Weight:  92.1 kg   Height:  5\' 8"  (1.727 m)     Physical Exam Vitals and nursing note reviewed.  Constitutional:      General: He is not in acute distress.    Appearance: Normal appearance. He is not ill-appearing, toxic-appearing or diaphoretic.  HENT:     Head: Normocephalic and atraumatic.     Nose: Nose normal.  Eyes:     General: No scleral icterus. Cardiovascular:     Rate and Rhythm: Normal rate and regular rhythm.     Comments:  Unable to palpate doralis pedis pulses in both feet Pulmonary:     Effort: Pulmonary effort is normal. No respiratory distress.     Breath sounds: No wheezing or rales.  Abdominal:     General: Bowel sounds are normal. There is no distension.     Tenderness: There is no abdominal tenderness. There is no guarding or rebound.  Skin:    Findings: Lesion present.     Comments: Sloughing of skin right 2nd toe. See pictures of 2nd toe   Neurological:     Mental Status: He is alert and oriented to person, place, and time.          Labs on Admission: I have personally reviewed following labs and imaging studies  CBC: Recent Labs  Lab 08/28/21 1106  WBC 12.8*  NEUTROABS 9.0*  HGB 13.5  HCT 40.8  MCV 88.3  PLT 876   Basic Metabolic Panel: Recent Labs  Lab 08/26/21 0952 08/28/21 1106  NA 136 132*  K 4.8 5.0  CL 96 94*  CO2 23 24  GLUCOSE 169* 160*  BUN 69* 71*  CREATININE 2.51* 2.11*  CALCIUM 9.0 9.8   GFR: Estimated Creatinine Clearance: 41 mL/min (A) (by C-G formula based on SCr of 2.11 mg/dL (H)). Liver Function Tests: Recent Labs  Lab 08/28/21 1106  AST 25  ALT 22  ALKPHOS 51  BILITOT 0.6  PROT 8.2*  ALBUMIN 4.1   No results for input(s): LIPASE, AMYLASE in the last 168 hours. No results for input(s): AMMONIA in the last 168 hours. Coagulation Profile: No results for input(s): INR, PROTIME in the last 168 hours. Cardiac Enzymes: No results for input(s): CKTOTAL, CKMB, CKMBINDEX, TROPONINI in the last 168 hours. BNP (last 3 results) No results for input(s): PROBNP in the last 8760 hours. HbA1C: No results for input(s): HGBA1C in the last 72 hours. CBG: No results for input(s): GLUCAP in the last 168 hours. Lipid Profile: No results for input(s): CHOL, HDL, LDLCALC, TRIG, CHOLHDL, LDLDIRECT in the last 72 hours. Thyroid Function  Tests: No results for input(s): TSH, T4TOTAL, FREET4, T3FREE, THYROIDAB in the last 72 hours. Anemia Panel: No results for  input(s): VITAMINB12, FOLATE, FERRITIN, TIBC, IRON, RETICCTPCT in the last 72 hours. Urine analysis:    Component Value Date/Time   COLORURINE AMBER (A) 01/24/2018 1108   APPEARANCEUR HAZY (A) 01/24/2018 1108   LABSPEC 1.021 01/24/2018 1108   PHURINE 5.0 01/24/2018 1108   GLUCOSEU >=500 (A) 01/24/2018 1108   HGBUR NEGATIVE 01/24/2018 1108   BILIRUBINUR NEGATIVE 01/24/2018 1108   KETONESUR NEGATIVE 01/24/2018 1108   PROTEINUR 30 (A) 01/24/2018 1108   NITRITE NEGATIVE 01/24/2018 1108   LEUKOCYTESUR NEGATIVE 01/24/2018 1108    Radiological Exams on Admission: I have personally reviewed images No results found.  EKG: I have personally reviewed EKG: no EKG today  Assessment/Plan Principal Problem:   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 Active Problems:   Diabetes mellitus type 2, uncomplicated (HCC)   HLD (hyperlipidemia)   Hypertension   PSVT (paroxysmal supraventricular tachycardia) (HCC)   Coronary artery disease   Chronic combined systolic (congestive) and diastolic (congestive) heart failure (HCC)   PAD (peripheral artery disease) (HCC)    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 Admit to medical bed. Management per podiatry. Will likely need right 2nd toe amputation. Defer antibiotic management to podiatry. Pt without any sensation in his foot due to diabetic neuropathy. Prn tylenol for pain.       Diabetes mellitus type 2, uncomplicated (HCC) Hold metformin. Check a1c. Add ssi.  HLD (hyperlipidemia) Continue crestor.  Hypertension Continue hold htn meds.  PSVT (paroxysmal supraventricular tachycardia) ([place]) Pt states he is scheduled for SVT ablation next month. Recently stopped amiodarone per cardiology recs.  Coronary artery disease On asa, toprol, crestor.  Chronic combined systolic (congestive) and diastolic (congestive) heart failure (HCC) Continue with demadex and aldactone.  PAD (peripheral artery disease) (HCC) On ASA.  DVT prophylaxis: SQ Heparin Code Status: Full  Code Family Communication: no family at bedside  Disposition Plan: return home  Consults called: EDP has consulted podiatry  Admission status: Inpatient, Med-Surg   Kristopher Oppenheim, DO [facility] 08/28/2021, 12:31 PM

## 2021-08-28 NOTE — Assessment & Plan Note (Signed)
Hold metformin. Check a1c. Add ssi.

## 2021-08-28 NOTE — Assessment & Plan Note (Signed)
On ASA 

## 2021-08-28 NOTE — Assessment & Plan Note (Addendum)
Hold diuretics and ARB due to elevated BUN and SCr

## 2021-08-28 NOTE — ED Triage Notes (Addendum)
Pt states Dr Luana Shu called and told him to come to the ED for surgical amputation of  his right 2nd toe, pt has discoloration/wound to the toe, had vascular US yesterday

## 2021-08-28 NOTE — ED Provider Notes (Signed)
[facility] Provider Note    Event Date/Time   First MD Initiated Contact with Patient 08/28/21 1143     (approximate)  History   Chief Complaint: Wound Infection  HPI  Ricardo Brown is a 61 y.o. male with a past medical history of CHF, diabetes, hypertension, presents to the emergency department for right second toe amputation.  According to the patient he has been working with Dr. Luana Shu of podiatry for discolored right second toe.  Patient had x-rays performed on Monday and an ultrasound performed yesterday was called today saying that the toe is ischemic and needs to be amputated for him to go to the emergency department for admission to the hospital for toe amputation.  Patient denies any fever, denies any pain in the toe states he has bad neuropathy and cannot feel his feet.  No other acute issues per patient.  Physical Exam   Triage Vital Signs: ED Triage Vitals  Enc Vitals Group     BP 08/28/21 1103 (!) 104/59     Pulse Rate 08/28/21 1103 81     Resp 08/28/21 1103 18     Temp 08/28/21 1103 98.1 F (36.7 C)     Temp Source 08/28/21 1103 Oral     SpO2 08/28/21 1103 98 %     Weight 08/28/21 1104 203 lb (92.1 kg)     Height 08/28/21 1104 5\' 8"  (1.727 m)     Head Circumference --      Peak Flow --      Pain Score --      Pain Loc --      Pain Edu? --      Excl. in [place]? --     Most recent vital signs: Vitals:   08/28/21 1103  BP: (!) 104/59  Pulse: 81  Resp: 18  Temp: 98.1 F (36.7 C)  SpO2: 98%    General: Awake, no distress.  CV:  Good peripheral perfusion.  Regular rate and rhythm  Resp:  Normal effort.  Equal breath sounds bilaterally.  Abd:  No distention.  Soft, nontender.  No rebound or guarding. Other:  Patient has a discolored black appearing right second toe with ulceration to the dorsal aspect.  Nontender.  Appears ischemic.   MEDICATIONS ORDERED IN ED: Medications - No data to display   IMPRESSION / MDM /  [place] / ED COURSE  I reviewed the triage vital signs and the nursing notes.  Patient presents to the emergency department for toe amputation.  Patient has been seen by podiatry Dr. Luana Shu has had x-ray done on Monday ultrasound yesterday and was told he needs to come to the emergency department for admission for toe amputation for his ischemic second toe.  Reviewed Dr. Deborra Medina note from 08/25/2021.  Patient was prescribed doxycycline, we will continue doxycycline in the hospital.  Patient's lab work today shows mild renal insufficiency however largely unchanged from historical values.  Slight white blood cell count elevation at 12,900.  We will consult the hospitalist for admission.  Patient agreeable to plan.  I spoke with Dr. Vickki Muff of podiatry who recommends continuing with oral doxycycline and holding heparin for now.  Patient will be admitted to the hospitalist service.  I spoke to the hospitalist who is agreeable to the plan to admit the patient for medical management for likely amputation tomorrow.  FINAL CLINICAL IMPRESSION(S) / ED DIAGNOSES   Ischemic toe  Note:  This document was prepared using  [employer] forces training and education officer and may include unintentional dictation errors.   Harvest Dark, MD 08/28/21 1320

## 2021-08-28 NOTE — Assessment & Plan Note (Signed)
Continue crestor 

## 2021-08-28 NOTE — ED Notes (Signed)
Informed RN bed assigned 

## 2021-08-28 NOTE — Consult Note (Addendum)
ORTHOPAEDIC CONSULTATION  REQUESTING PHYSICIAN: Kristopher Oppenheim, DO  Chief Complaint: Right second toe gangrene  HPI: Ricardo Brown is a 61 y.o. male who complains of right second toe gangrenous changes.  Has been seen in the outpatient clinic.  He had ABIs performed at Dr. Bunnie Domino office yesterday.  Presents today with gangrenous changes of his right second toe secondary with an open ulceration.  He has diabetes with neuropathy.  Past Medical History:  Diagnosis Date   Chronic combined systolic (congestive) and diastolic (congestive) heart failure ([place])    a. 01/2018 EF 35-40%; b. 10/2020 Echo: EF 40-45%, glob HK. RVSP 42.62mmHg. Mildly dil LA. Mild MS/AoV sclerosis.   Coronary artery disease    a. 01/2018 late presenting inferior MI; b. 01/2018 Cath: Severe multivessel dzs-->CABG x 3 (LIMA->LAD, VG->OM2, VG->RPDA @ [facility]).   Diabetes mellitus without complication ([place])    a. Dx ~ 2000.   Heart palpitations    a. Pt reports prior nl echo's and stress tests. Last stress test w/in past 2 yrs - PCP.   High cholesterol    Hypertension    Mitral regurgitation    a. 01/2018 s/p MV repair @ time of CABG.   Myocardial infarction [facility])    Postoperative atrial fibrillation    a. 01/2018 @ time of CABG.   PSVT (paroxysmal supraventricular tachycardia) ([place])    a. Very symptomatic with multiple ED evaluations.  Has been on amio.   STEMI (ST elevation myocardial infarction) ([place]) 01/24/2018   Past Surgical History:  Procedure Laterality Date   CARDIAC CATHETERIZATION     CARDIAC VALVE REPLACEMENT     Mitral Valve Repair   COLONOSCOPY WITH PROPOFOL N/A 06/07/2019   Procedure: COLONOSCOPY WITH PROPOFOL;  Surgeon: [name], MD;  Location: [facility] ENDOSCOPY;  Service: Endoscopy;  Laterality: N/A;   COLONOSCOPY WITH PROPOFOL N/A 02/07/2020   Procedure: COLONOSCOPY WITH PROPOFOL;  Surgeon: [name], MD;  Location: [facility] ENDOSCOPY;  Service: Endoscopy;  Laterality: N/A;   COLONOSCOPY  WITH PROPOFOL N/A 12/11/2020   Procedure: COLONOSCOPY WITH PROPOFOL;  Surgeon: [name], MD;  Location: [facility] ENDOSCOPY;  Service: Endoscopy;  Laterality: N/A;   CORONARY ANGIOPLASTY     CORONARY ARTERY BYPASS GRAFT     x 3   01/2018   ESOPHAGOGASTRODUODENOSCOPY N/A 02/07/2020   Procedure: ESOPHAGOGASTRODUODENOSCOPY (EGD);  Surgeon: [name], MD;  Location: [facility] ENDOSCOPY;  Service: Endoscopy;  Laterality: N/A;   LEFT HEART CATH AND CORONARY ANGIOGRAPHY N/A 01/24/2018   Procedure: LEFT HEART CATH AND CORONARY ANGIOGRAPHY;  Surgeon: [name], MD;  Location: [place] CV LAB;  Service: Cardiovascular;  Laterality: N/A;   TONSILLECTOMY     Social History   Socioeconomic History   Marital status: Married    Spouse name: Not on file   Number of children: Not on file   Years of education: Not on file   Highest education level: Not on file  Occupational History   Not on file  Tobacco Use   Smoking status: Never   Smokeless tobacco: Never  Vaping Use   Vaping Use: Never used  Substance and Sexual Activity   Alcohol use: Not Currently    Comment: rare beer   Drug use: No   Sexual activity: Not on file  Other Topics Concern   Not on file  Social History Narrative   Lives locally with wife.  Works in [employer].  Does not routinely exercise.   Social Determinants of Health  Financial Resource Strain: Not on file  Food Insecurity: Not on file  Transportation Needs: Not on file  Physical Activity: Not on file  Stress: Not on file  Social Connections: Not on file   Family History  Problem Relation Age of Onset   Cancer Mother        died @ 43   CAD Father        First MI @ 50. S/p heart transplant. Died in mid-50's of cancer.   Cancer Father    Other Brother        alive and well   Allergies  Allergen Reactions   Entresto [Sacubitril-Valsartan] Other (See Comments)    SVT   Lipitor [Atorvastatin] Palpitations    SVT   Pregabalin Other  (See Comments)   Prior to Admission medications   Medication Sig Start Date End Date Taking? Authorizing Provider  aspirin 81 MG EC tablet Take 81 mg by mouth daily.     [provider]  b complex vitamins tablet Take 1 tablet by mouth daily.     [provider]  doxycycline (VIBRA-TABS) 100 MG tablet Take 100 mg by mouth 2 (two) times daily. 08/25/21   [provider]  Dulaglutide 1.5 MG/0.5ML SOPN Inject into the skin once a week.  03/30/18   [provider]  empagliflozin (JARDIANCE) 25 MG TABS tablet Take 25 mg by mouth daily.    [provider]  gabapentin (NEURONTIN) 100 MG capsule Take 1 capsule (100 mg total) by mouth 2 (two) times daily. 08/05/21   [name], MD  losartan (COZAAR) 100 MG tablet Take 1 tablet (100 mg total) by mouth daily. 09/02/20   [name], MD  metFORMIN (GLUCOPHAGE) 500 MG tablet Take 2 tablets (1,000 mg total) by mouth 2 (two) times daily. 06/18/21   [name], MD  metolazone (ZAROXOLYN) 2.5 MG tablet Take 1 tablet (2.5 mg) by mouth once a week on Wednesdays. Take 30 mins prior to your Torsemide. 08/27/21   Theora Gianotti, NP  metoprolol succinate (TOPROL-XL) 50 MG 24 hr tablet Take 1 tablet (50 mg total) by mouth daily. Take with or immediately following a meal. 05/27/21   [name], MD  Omega-3 Fatty Acids (FISH OIL) 875 MG CAPS Take 1 capsule by mouth 2 (two) times daily.    [provider]  Pancrelipase, Lip-Prot-Amyl, (ZENPEP) 40000-126000 units CPEP Take 80,000 Units by mouth 3 (three) times daily with meals AND 40,000 Units with snacks. 08/08/21   [name], MD  pramipexole (MIRAPEX) 1.5 MG tablet Take 1 tablet (1.5 mg total) by mouth 2 (two) times daily. 05/21/21   [name], MD  rosuvastatin (CRESTOR) 20 MG tablet Take 1 tablet (20 mg total) by mouth daily. 09/02/20   [name], MD  spironolactone (ALDACTONE) 25 MG tablet Take 1 tablet (25 mg total) by mouth  daily. 08/25/21   [name], MD  torsemide (DEMADEX) 20 MG tablet Take 2 tablets (40 mg total) by mouth daily. 40 mg daily 07/14/21   [name], MD  TRESIBA FLEXTOUCH 200 UNIT/ML FlexTouch Pen 14 Units daily. 08/26/21   [provider]   No results found.  Positive ROS: All other systems have been reviewed and were otherwise negative with the exception of those mentioned in the HPI and as above.  12 point ROS was performed.  Physical Exam: General: Alert and oriented.  No apparent distress.  Vascular:  Left foot:Dorsalis Pedis:  present Posterior Tibial:  diminished  Right foot: Dorsalis Pedis:  present Posterior Tibial:  absent.  He does have edema to the right ankle.  Capillary fill time is brisk.  Foot is warm.  Neuro:absent protective sensation  Derm: Right second toe from the PIPJ distally has necrosis.  There is an open wound with exposed bone at the DIPJ region.  See clinical picture     Ortho/MS: Edema to the right foot and ankle.  Assessment: Osteomyelitis right second toe Diabetic foot ulceration with necrosis to bone right foot  Plan: Patient will obviously need amputation of the right second toe.  I have reached out to vascular surgery for their opinion in regards to performing an angio prior to amputation.  ABIs performed in the office yesterday showed a toe pressure on the right foot at 0.6.  ABIs were above 1.  Discussed with the patient surgical intervention for amputation of the toe and he is well aware of the plan going forward.  Addendum:  D/W Dr. Lucky Cowboy (vascular) and waveforms are acceptable to proceed with amputation.  Consult vascular if bleeding is not adequate for healing or complications arise post op.  Will d/w Dr. Cleda Mccreedy to plan amputation tomorrow.    Elesa Hacker, DPM Cell [phone]   08/28/2021 12:37 PM

## 2021-08-29 ENCOUNTER — Inpatient Hospital Stay: Payer: 59 | Admitting: Anesthesiology

## 2021-08-29 ENCOUNTER — Encounter
Admission: EM | Disposition: A | Discharge status: Home / Self Care | Payer: Self-pay | Source: Home / Self Care | Attending: Internal Medicine

## 2021-08-29 ENCOUNTER — Encounter: Payer: Self-pay | Admitting: Internal Medicine

## 2021-08-29 ENCOUNTER — Other Ambulatory Visit: Payer: Self-pay

## 2021-08-29 HISTORY — PX: AMPUTATION TOE: SHX6595

## 2021-08-29 LAB — HEMOGLOBIN A1C
Hgb A1c MFr Bld: 7.4 % — ABNORMAL HIGH (ref 4.8–5.6)
Mean Plasma Glucose: 166 mg/dL

## 2021-08-29 LAB — COMPREHENSIVE METABOLIC PANEL
ALT: 19 U/L (ref 0–44)
AST: 19 U/L (ref 15–41)
Albumin: 3.4 g/dL — ABNORMAL LOW (ref 3.5–5.0)
Alkaline Phosphatase: 47 U/L (ref 38–126)
Anion gap: 9 (ref 5–15)
BUN: 67 mg/dL — ABNORMAL HIGH (ref 6–20)
CO2: 27 mmol/L (ref 22–32)
Calcium: 9.1 mg/dL (ref 8.9–10.3)
Chloride: 94 mmol/L — ABNORMAL LOW (ref 98–111)
Creatinine, Ser: 1.83 mg/dL — ABNORMAL HIGH (ref 0.61–1.24)
GFR, Estimated: 42 mL/min — ABNORMAL LOW (ref >60)
Glucose, Bld: 147 mg/dL — ABNORMAL HIGH (ref 70–99)
Potassium: 3.8 mmol/L (ref 3.5–5.1)
Sodium: 130 mmol/L — ABNORMAL LOW (ref 135–145)
Total Bilirubin: 0.6 mg/dL (ref 0.3–1.2)
Total Protein: 7 g/dL (ref 6.5–8.1)

## 2021-08-29 LAB — CBC WITH DIFFERENTIAL/PLATELET
Abs Immature Granulocytes: 0.08 10*3/uL — ABNORMAL HIGH (ref 0.00–0.07)
Basophils Absolute: 0.1 10*3/uL (ref 0.0–0.1)
Basophils Relative: 1 %
Eosinophils Absolute: 0.2 10*3/uL (ref 0.0–0.5)
Eosinophils Relative: 3 %
HCT: 36.4 % — ABNORMAL LOW (ref 39.0–52.0)
Hemoglobin: 12.6 g/dL — ABNORMAL LOW (ref 13.0–17.0)
Immature Granulocytes: 1 %
Lymphocytes Relative: 22 %
Lymphs Abs: 2 10*3/uL (ref 0.7–4.0)
MCH: 29.7 pg (ref 26.0–34.0)
MCHC: 34.6 g/dL (ref 30.0–36.0)
MCV: 85.8 fL (ref 80.0–100.0)
Monocytes Absolute: 0.6 10*3/uL (ref 0.1–1.0)
Monocytes Relative: 7 %
Neutro Abs: 6.1 10*3/uL (ref 1.7–7.7)
Neutrophils Relative %: 66 %
Platelets: 252 10*3/uL (ref 150–400)
RBC: 4.24 MIL/uL (ref 4.22–5.81)
RDW: 12.6 % (ref 11.5–15.5)
WBC: 9.2 10*3/uL (ref 4.0–10.5)
nRBC: 0 % (ref 0.0–0.2)

## 2021-08-29 LAB — GLUCOSE, CAPILLARY
Glucose-Capillary: 134 mg/dL — ABNORMAL HIGH (ref 70–99)
Glucose-Capillary: 144 mg/dL — ABNORMAL HIGH (ref 70–99)
Glucose-Capillary: 108 mg/dL — ABNORMAL HIGH (ref 70–99)
Glucose-Capillary: 123 mg/dL — ABNORMAL HIGH (ref 70–99)
Glucose-Capillary: 205 mg/dL — ABNORMAL HIGH (ref 70–99)

## 2021-08-29 LAB — MAGNESIUM: Magnesium: 2.1 mg/dL (ref 1.7–2.4)

## 2021-08-29 LAB — HIV ANTIBODY (ROUTINE TESTING W REFLEX): HIV Screen 4th Generation wRfx: NONREACTIVE

## 2021-08-29 SURGERY — AMPUTATION, TOE
Anesthesia: General | Site: Toe | Laterality: Right

## 2021-08-29 MED ORDER — BUPIVACAINE HCL 0.5 % IJ SOLN
INTRAMUSCULAR | Status: DC | PRN
  Administered 2021-08-29: 8 mL

## 2021-08-29 MED ORDER — OXYCODONE HCL 5 MG/5ML PO SOLN: 5.0000 mg | Freq: Once | ORAL | Status: DC | PRN

## 2021-08-29 MED ORDER — ACETAMINOPHEN 10 MG/ML IV SOLN: 1000.0000 mg | Freq: Once | INTRAVENOUS | Status: DC | PRN

## 2021-08-29 MED ORDER — FENTANYL CITRATE (PF) 100 MCG/2ML IJ SOLN: 25.0000 ug | INTRAMUSCULAR | Status: DC | PRN

## 2021-08-29 MED ORDER — BUPIVACAINE HCL (PF) 0.5 % IJ SOLN
INTRAMUSCULAR | Status: AC
  Filled 2021-08-29: qty 30

## 2021-08-29 MED ORDER — PROPOFOL 500 MG/50ML IV EMUL
INTRAVENOUS | Status: AC
  Filled 2021-08-29: qty 50

## 2021-08-29 MED ORDER — PROMETHAZINE HCL 25 MG/ML IJ SOLN: 6.2500 mg | INTRAMUSCULAR | Status: DC | PRN

## 2021-08-29 MED ORDER — OXYCODONE HCL 5 MG PO TABS: 5.0000 mg | ORAL_TABLET | Freq: Once | ORAL | Status: DC | PRN

## 2021-08-29 MED ORDER — PHENYLEPHRINE HCL (PRESSORS) 10 MG/ML IV SOLN
INTRAVENOUS | Status: DC | PRN
  Administered 2021-08-29 (×2): 80 ug via INTRAVENOUS
  Administered 2021-08-29 (×2): 160 ug via INTRAVENOUS

## 2021-08-29 MED ORDER — CEFAZOLIN SODIUM-DEXTROSE 2-4 GM/100ML-% IV SOLN
INTRAVENOUS | Status: AC
  Filled 2021-08-29: qty 100

## 2021-08-29 MED ORDER — HYDROCODONE-ACETAMINOPHEN 5-325 MG PO TABS
1.0000 | ORAL_TABLET | ORAL | Status: DC | PRN
  Administered 2021-08-30: 1 via ORAL
  Filled 2021-08-29: qty 1

## 2021-08-29 MED ORDER — CEFAZOLIN SODIUM-DEXTROSE 2-3 GM-%(50ML) IV SOLR
INTRAVENOUS | Status: DC | PRN
  Administered 2021-08-29: 2 g via INTRAVENOUS

## 2021-08-29 MED ORDER — SODIUM CHLORIDE 0.9 % IV SOLN: INTRAVENOUS | Status: DC

## 2021-08-29 MED ORDER — DROPERIDOL 2.5 MG/ML IJ SOLN
0.6250 mg | Freq: Once | INTRAMUSCULAR | Status: DC | PRN
  Filled 2021-08-29: qty 2

## 2021-08-29 MED ORDER — LIDOCAINE HCL (PF) 1 % IJ SOLN
INTRAMUSCULAR | Status: AC
  Filled 2021-08-29: qty 30

## 2021-08-29 MED ORDER — PROPOFOL 500 MG/50ML IV EMUL
INTRAVENOUS | Status: DC | PRN
  Administered 2021-08-29: 200 ug/kg/min via INTRAVENOUS

## 2021-08-29 MED ORDER — EPHEDRINE SULFATE (PRESSORS) 50 MG/ML IJ SOLN
INTRAMUSCULAR | Status: DC | PRN
Start: 2021-08-29 — End: 2021-08-29
  Administered 2021-08-29 (×2): 10 mg via INTRAVENOUS

## 2021-08-29 SURGICAL SUPPLY — 48 items
BLADE MED AGGRESSIVE (BLADE) ×1 IMPLANT
BLADE OSC/SAGITTAL MD 5.5X18 (BLADE) ×1 IMPLANT
BLADE SURG 15 STRL LF DISP TIS (BLADE) ×2 IMPLANT
BLADE SURG 15 STRL SS (BLADE) ×3
BLADE SURG MINI STRL (BLADE) ×2 IMPLANT
BNDG CONFORM 2 STRL LF (GAUZE/BANDAGES/DRESSINGS) ×2 IMPLANT
BNDG CONFORM 3 STRL LF (GAUZE/BANDAGES/DRESSINGS) ×2 IMPLANT
BNDG ELASTIC 4X5.8 VLCR STR LF (GAUZE/BANDAGES/DRESSINGS) ×2 IMPLANT
BNDG ESMARK 4X12 TAN STRL LF (GAUZE/BANDAGES/DRESSINGS) ×2 IMPLANT
BNDG GAUZE ELAST 4 BULKY (GAUZE/BANDAGES/DRESSINGS) ×2 IMPLANT
CUFF TOURN SGL QUICK 12 (TOURNIQUET CUFF) ×1 IMPLANT
CUFF TOURN SGL QUICK 18X4 (TOURNIQUET CUFF) ×2 IMPLANT
DRAPE FLUOR MINI C-ARM 54X84 (DRAPES) ×2 IMPLANT
DURAPREP 26ML APPLICATOR (WOUND CARE) ×2 IMPLANT
ELECT REM PT RETURN 9FT ADLT (ELECTROSURGICAL) ×2
ELECTRODE REM PT RTRN 9FT ADLT (ELECTROSURGICAL) ×1 IMPLANT
GAUZE SPONGE 4X4 12PLY STRL (GAUZE/BANDAGES/DRESSINGS) ×2 IMPLANT
GAUZE XEROFORM 1X8 LF (GAUZE/BANDAGES/DRESSINGS) ×2 IMPLANT
GLOVE SURG ENC MOIS LTX SZ7.5 (GLOVE) ×2 IMPLANT
GLOVE SURG UNDER LTX SZ8 (GLOVE) ×2 IMPLANT
GOWN STRL REUS W/ TWL LRG LVL3 (GOWN DISPOSABLE) ×2 IMPLANT
GOWN STRL REUS W/TWL LRG LVL3 (GOWN DISPOSABLE) ×2
HANDPIECE VERSAJET DEBRIDEMENT (MISCELLANEOUS) ×1 IMPLANT
IV NS IRRIG 3000ML ARTHROMATIC (IV SOLUTION) ×2 IMPLANT
KIT TURNOVER KIT A (KITS) ×2 IMPLANT
LABEL OR SOLS (LABEL) ×1 IMPLANT
MANIFOLD NEPTUNE II (INSTRUMENTS) ×2 IMPLANT
NDL FILTER BLUNT 18X1 1/2 (NEEDLE) ×1 IMPLANT
NDL HYPO 25X1 1.5 SAFETY (NEEDLE) ×2 IMPLANT
NEEDLE FILTER BLUNT 18X 1/2SAF (NEEDLE) ×1
NEEDLE FILTER BLUNT 18X1 1/2 (NEEDLE) ×1 IMPLANT
NEEDLE HYPO 25X1 1.5 SAFETY (NEEDLE) ×4 IMPLANT
NS IRRIG 500ML POUR BTL (IV SOLUTION) ×2 IMPLANT
PACK EXTREMITY ARMC (MISCELLANEOUS) ×2 IMPLANT
PAD ABD DERMACEA PRESS 5X9 (GAUZE/BANDAGES/DRESSINGS) ×2 IMPLANT
SOL PREP PVP 2OZ (MISCELLANEOUS) ×2
SOLUTION PREP PVP 2OZ (MISCELLANEOUS) ×1 IMPLANT
STOCKINETTE BIAS CUT 4 980044 (GAUZE/BANDAGES/DRESSINGS) ×1 IMPLANT
STOCKINETTE STRL 6IN 960660 (GAUZE/BANDAGES/DRESSINGS) ×2 IMPLANT
STRIP CLOSURE SKIN 1/4X4 (GAUZE/BANDAGES/DRESSINGS) ×2 IMPLANT
SUT ETHILON 3-0 FS-10 30 BLK (SUTURE) ×2
SUT ETHILON 4-0 (SUTURE)
SUT ETHILON 4-0 FS2 18XMFL BLK (SUTURE)
SUTURE EHLN 3-0 FS-10 30 BLK (SUTURE) ×1 IMPLANT
SUTURE ETHLN 4-0 FS2 18XMF BLK (SUTURE) IMPLANT
SWAB CULTURE AMIES ANAERIB BLU (MISCELLANEOUS) ×2 IMPLANT
SYR 10ML LL (SYRINGE) ×2 IMPLANT
WATER STERILE IRR 500ML POUR (IV SOLUTION) ×2 IMPLANT

## 2021-08-29 NOTE — Op Note (Signed)
Date of operation: 08/29/2021.  Surgeon: [name] D.P.M.  Preoperative diagnosis: Gangrene with osteomyelitis right second toe.  Postoperative diagnosis: Same.  Procedure: Amputation right second toe metatarsal phalangeal joint.  Anesthesia: Local MAC.  Hemostasis: None.  Estimated blood loss: 10 cc.  Pathology: Right second toe.  Cultures: Bone cultures proximal phalanx right second toe.  Complications: None apparent.  Operative indications: This is a 61 year old male with history of injury to the right second toe.  Condition of the toe continued to worsen and presented to the emergency department with gangrenous changes to the second toe requiring amputation.  Operative procedure: Patient was taken to the operating room and placed on the table in the supine position.  Following satisfactory sedation the right foot was anesthetized with 8 cc of 0.5% Marcaine plain around the second metatarsal.  The foot was then prepped and draped in the usual sterile fashion.  Attention was then directed to the right foot where an elliptical incision was made coursing dorsal to plantar around the medial and lateral base of the second toe.  Incision was carried full-thickness down to bone with dissection carried back proximally to the metatarsal phalangeal joint where the toe was disarticulated and removed in toto.  The wound was flushed with copious amounts of sterile saline and then closed using 3-0 nylon vertical mattress and simple interrupted sutures.  Xeroform 4 x 4's and conformer applied to the right foot followed by Kerlix and an Ace wrap.  Patient was awakened and transported to the PACU having tolerated the anesthesia and procedures well.

## 2021-08-29 NOTE — Anesthesia Preprocedure Evaluation (Addendum)
Anesthesia Evaluation  Patient identified by MRN, date of birth, ID band Patient awake    Reviewed: Allergy & Precautions, NPO status , Patient's Chart, lab work & pertinent test results, reviewed documented beta blocker date and time   Airway Mallampati: III  TM Distance: >3 FB Neck ROM: Full    Dental  (+) Upper Dentures, Lower Dentures   Pulmonary neg pulmonary ROS,    Pulmonary exam normal        Cardiovascular hypertension, Pt. on medications and Pt. on home beta blockers + CAD, + Past MI (2019), + CABG (2019 x3), +CHF and + DOE  Normal cardiovascular exam+ dysrhythmias (RBBB)   01/2018 EF 35-40%; b. 10/2020 Echo: EF 40-45%, glob HK. RVSP 42.52mmHg. Mildly dil LA. Mild MS/AoV sclerosis.  ECHO 3/22: 1. Left ventricular ejection fraction, by estimation, is 40 to 45%. The  left ventricle has mildly decreased function. The left ventricle  demonstrates global hypokinesis. The left ventricular internal cavity size  was mildly dilated. There is mild left  ventricular hypertrophy. Left ventricular diastolic parameters are  indeterminate. The average left ventricular global longitudinal strain is  -12.7 %. The global longitudinal strain is abnormal.  2. Right ventricular systolic function is normal. The right ventricular  size is normal. There is mildly elevated pulmonary artery systolic  pressure. The estimated right ventricular systolic pressure is 89.1 mmHg.  3. Left atrial size was mildly dilated.  4. The mitral valve has been repaired/replaced. Trivial mitral valve  regurgitation. Mild mitral stenosis. The mean mitral valve gradient is 7.0  mmHg. Procedure Date: 2019.  5. The aortic valve is normal in structure. Aortic valve regurgitation is  not visualized. Mild aortic valve sclerosis is present, with no evidence  of aortic valve stenosis.  6. The inferior vena cava is normal in size with greater than 50%  respiratory  variability, suggesting right atrial pressure of 3 mmHg.    Neuro/Psych negative neurological ROS  negative psych ROS   GI/Hepatic negative GI ROS, Neg liver ROS,   Endo/Other  diabetes, Well Controlled, Type 2, Oral Hypoglycemic Agents  Renal/GU CRFRenal disease  negative genitourinary   Musculoskeletal negative musculoskeletal ROS (+)   Abdominal Normal abdominal exam  (+)   Peds negative pediatric ROS (+)  Hematology negative hematology ROS (+) anemia ,   Anesthesia Other Findings 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  CAD, CABG Mitral Valve Repair 2019 Diabetes mellitus without complication ([place])     Comment:  a. Dx ~ 2000. Heart palpitations High cholesterol  EF 35-40%  Reproductive/Obstetrics                            Anesthesia Physical  Anesthesia Plan  ASA: III  Anesthesia Plan: General   Post-op Pain Management:    Induction: Intravenous  PONV Risk Score and Plan: TIVA and Propofol infusion  Airway Management Planned: Nasal Cannula and Natural Airway  Additional Equipment:   Intra-op Plan:   Post-operative Plan:   Informed Consent: I have reviewed the patients History and Physical, chart, labs and discussed the procedure including the risks, benefits and alternatives for the proposed anesthesia with the patient or authorized representative who has indicated his/her understanding and acceptance.     Dental advisory given  Plan Discussed with: CRNA, Surgeon and Anesthesiologist  Anesthesia Plan Comments:        Anesthesia Quick Evaluation

## 2021-08-29 NOTE — TOC Initial Note (Signed)
Transition of Care [facility]) - Initial/Assessment Note    Patient Details  Name: Ricardo Brown MRN: [id number] Date of Birth: 1961-02-14  [facility]) CM/SW Contact:    Conception Oms, RN Phone Number: 08/29/2021, 10:15 AM  Clinical Narrative:                 Patient from home, will need amputation, vascular may do Angio prior to, TOC to follow for needs        Patient Goals and CMS Choice        Expected Discharge Plan and Services                                                Prior Living Arrangements/Services                       Activities of Daily Living Home Assistive Devices/Equipment: None ADL Screening (condition at time of admission) Patient's cognitive ability adequate to safely complete daily activities?: Yes Is the patient deaf or have difficulty hearing?: No Does the patient have difficulty seeing, even when wearing glasses/contacts?: Yes (reading glassess) Does the patient have difficulty concentrating, remembering, or making decisions?: No Patient able to express need for assistance with ADLs?: Yes Does the patient have difficulty dressing or bathing?: No Independently performs ADLs?: Yes (appropriate for developmental age) Does the patient have difficulty walking or climbing stairs?: No Weakness of Legs: Both (bilaeral lower extremity neuropathy) Weakness of Arms/Hands: None  Permission Sought/Granted                  Emotional Assessment              Admission diagnosis:  Ischemic toe [I99.8] Patient Active Problem List   Diagnosis Date Noted   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 08/28/2021   Chronic combined systolic (congestive) and diastolic (congestive) heart failure ([place]) 08/28/2021   PAD (peripheral artery disease) ([place]) 08/28/2021   Restless leg syndrome 05/21/2021   History of colonic polyps    Lipoma of colon    Screening for colon cancer    Polyp of colon    Coronary artery disease  05/31/2019   PSVT (paroxysmal supraventricular tachycardia) ([place]) 03/22/2018   Diabetes mellitus type 2, uncomplicated ([place]) 41/28/7867   HLD (hyperlipidemia) 03/07/2018   Hypertension 03/07/2018   Right ventricular dysfunction 02/02/2018   Postoperative atrial fibrillation ([place]) 02/01/2018   S/P CABG x 3 01/28/2018   PCP:  [name], MD Pharmacy:   [facility] [address], [place] - [place] [address] [place] [address] Phone: [phone] Fax: [phone]     Social Determinants of Health (SDOH) Interventions    Readmission Risk Interventions No flowsheet data found.

## 2021-08-29 NOTE — Anesthesia Procedure Notes (Signed)
Date/Time: 08/29/2021 5:38 PM Performed by: Nelda Marseille, CRNA Pre-anesthesia Checklist: Patient identified, Emergency Drugs available, Suction available, Patient being monitored and Timeout performed Oxygen Delivery Method: Simple face mask

## 2021-08-29 NOTE — Interval H&P Note (Signed)
History and Physical Interval Note:  08/29/2021 5:08 PM  Ricardo Brown  has presented today for surgery, with the diagnosis of 2nd toe amputation.  The various methods of treatment have been discussed with the patient and family. After consideration of risks, benefits and other options for treatment, the patient has consented to  Procedure(s): AMPUTATION TOE-2nd toe (Right) as a surgical intervention.  The patient's history has been reviewed, patient examined, no change in status, stable for surgery.  I have reviewed the patient's chart and labs.  Questions were answered to the patient's satisfaction.     [name]

## 2021-08-29 NOTE — Progress Notes (Signed)
PROGRESS NOTE    Ricardo Brown  AVW:[id number] DOB: 03-Feb-1961 DOA: 08/28/2021 PCP: [name], MD    Brief Narrative:  61 year old white male with a history of hypertension, type 2 diabetes, combined chronic systolic and diastolic heart failure, coronary disease status post CABG, history of SVT who presents to the ER today with worsening ischemia of his right second toe.   Assessment & Plan:   Principal Problem:   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 Active Problems:   Diabetes mellitus type 2, uncomplicated (HCC)   HLD (hyperlipidemia)   Hypertension   PSVT (paroxysmal supraventricular tachycardia) (HCC)   Coronary artery disease   Chronic combined systolic (congestive) and diastolic (congestive) heart failure (HCC)   PAD (peripheral artery disease) (HCC)  Right second toe ischemia with gangrene. Patient is scheduled for amputation today.  Continue as needed pain medicine.  Acute kidney injury on chronic kidney disease stage IIIa. Hyponatremia. Most recent renal function on 08/20/2021 showed creatinine 1.66 with GFR of 47. Restart gentle rehydration.  Essential hypertension. Chronic combined systolic and diastolic congestive heart failure. Peripheral arterial disease. Continue Crestor. Blood pressure is not significant elevated.  Type 2 diabetes. Continue sliding scale insulin.  DVT prophylaxis: Heparin Code Status: full Family Communication:  Disposition Plan:    Status is: Inpatient  Remains inpatient appropriate because: Severity disease, pending procedure.        I/O last 3 completed shifts: In: -  Out: 600 [Urine:600] Total I/O In: -  Out: 300 [Urine:300]     Consultants:  Podiatry  Procedures: Pending  Antimicrobials: Doxy Subjective: Patient still complaining of foot pain, otherwise doing well. No short of breath or cough. No fever or chills.  Objective: Vitals:   08/29/21 0500 08/29/21 0509 08/29/21 0821 08/29/21 1154  BP:   112/65 97/60 112/62  Pulse:  68 72 70  Resp:  16 16 18   Temp:  97.9 F (36.6 C) 98.3 F (36.8 C) 98 F (36.7 C)  TempSrc:   Oral   SpO2:  95% 100% 100%  Weight: 88.6 kg     Height:        Intake/Output Summary (Last 24 hours) at 08/29/2021 1230 Last data filed at 08/29/2021 0715 Gross per 24 hour  Intake --  Output 900 ml  Net -900 ml   Filed Weights   08/28/21 1104 08/29/21 0500  Weight: 92.1 kg 88.6 kg    Examination:  General exam: Appears calm and comfortable  Respiratory system: Clear to auscultation. Respiratory effort normal. Cardiovascular system: S1 & S2 heard, RRR. No JVD, murmurs, rubs, gallops or clicks. No pedal edema. Gastrointestinal system: Abdomen is nondistended, soft and nontender. No organomegaly or masses felt. Normal bowel sounds heard. Central nervous system: Alert and oriented. No focal neurological deficits. Extremities: Right second toe gangrene. Skin: No rashes, lesions or ulcers Psychiatry: Judgement and insight appear normal. Mood & affect appropriate.     Data Reviewed: I have personally reviewed following labs and imaging studies  CBC: Recent Labs  Lab 08/28/21 1106 08/29/21 0639  WBC 12.8* 9.2  NEUTROABS 9.0* 6.1  HGB 13.5 12.6*  HCT 40.8 36.4*  MCV 88.3 85.8  PLT 311 829   Basic Metabolic Panel: Recent Labs  Lab 08/26/21 0952 08/28/21 1106 08/29/21 0639  NA 136 132* 130*  K 4.8 5.0 3.8  CL 96 94* 94*  CO2 23 24 27   GLUCOSE 169* 160* 147*  BUN 69* 71* 67*  CREATININE 2.51* 2.11* 1.83*  CALCIUM 9.0 9.8  9.1  MG  --   --  2.1   GFR: Estimated Creatinine Clearance: 46.4 mL/min (A) (by C-G formula based on SCr of 1.83 mg/dL (H)). Liver Function Tests: Recent Labs  Lab 08/28/21 1106 08/29/21 0639  AST 25 19  ALT 22 19  ALKPHOS 51 47  BILITOT 0.6 0.6  PROT 8.2* 7.0  ALBUMIN 4.1 3.4*   No results for input(s): LIPASE, AMYLASE in the last 168 hours. No results for input(s): AMMONIA in the last 168  hours. Coagulation Profile: No results for input(s): INR, PROTIME in the last 168 hours. Cardiac Enzymes: No results for input(s): CKTOTAL, CKMB, CKMBINDEX, TROPONINI in the last 168 hours. BNP (last 3 results) No results for input(s): PROBNP in the last 8760 hours. HbA1C: Recent Labs    08/28/21 1106  HGBA1C 7.4*   CBG: Recent Labs  Lab 08/28/21 1733 08/28/21 2110 08/29/21 0748 08/29/21 1154  GLUCAP 167* 133* 134* 144*   Lipid Profile: No results for input(s): CHOL, HDL, LDLCALC, TRIG, CHOLHDL, LDLDIRECT in the last 72 hours. Thyroid Function Tests: No results for input(s): TSH, T4TOTAL, FREET4, T3FREE, THYROIDAB in the last 72 hours. Anemia Panel: No results for input(s): VITAMINB12, FOLATE, FERRITIN, TIBC, IRON, RETICCTPCT in the last 72 hours. Sepsis Labs: No results for input(s): PROCALCITON, LATICACIDVEN in the last 168 hours.  Recent Results (from the past 240 hour(s))  Resp Panel by RT-PCR (Flu A&B, Covid) Nasopharyngeal Swab     Status: None   Collection Time: 08/28/21 12:13 PM   Specimen: Nasopharyngeal Swab; Nasopharyngeal(NP) swabs in vial transport medium  Result Value Ref Range Status   SARS Coronavirus 2 by RT PCR NEGATIVE NEGATIVE Final    Comment: (NOTE) SARS-CoV-2 target nucleic acids are NOT DETECTED.  The SARS-CoV-2 RNA is generally detectable in upper respiratory specimens during the acute phase of infection. The lowest concentration of SARS-CoV-2 viral copies this assay can detect is 138 copies/mL. A negative result does not preclude SARS-Cov-2 infection and should not be used as the sole basis for treatment or other patient management decisions. A negative result may occur with  improper specimen collection/handling, submission of specimen other than nasopharyngeal swab, presence of viral mutation(s) within the areas targeted by this assay, and inadequate number of viral copies(<138 copies/mL). A negative result must be combined with clinical  observations, patient history, and epidemiological information. The expected result is Negative.  Fact Sheet for Patients:  EntrepreneurPulse.com.au  Fact Sheet for Healthcare Providers:  IncredibleEmployment.be  This test is no t yet approved or cleared by the [place] FDA and  has been authorized for detection and/or diagnosis of SARS-CoV-2 by FDA under an Emergency Use Authorization (EUA). This EUA will remain  in effect (meaning this test can be used) for the duration of the COVID-19 declaration under Section 564(b)(1) of the Act, 21 U.S.C.section 360bbb-3(b)(1), unless the authorization is terminated  or revoked sooner.       Influenza A by PCR NEGATIVE NEGATIVE Final   Influenza B by PCR NEGATIVE NEGATIVE Final    Comment: (NOTE) The Xpert Xpress SARS-CoV-2/FLU/RSV plus assay is intended as an aid in the diagnosis of influenza from Nasopharyngeal swab specimens and should not be used as a sole basis for treatment. Nasal washings and aspirates are unacceptable for Xpert Xpress SARS-CoV-2/FLU/RSV testing.  Fact Sheet for Patients: EntrepreneurPulse.com.au  Fact Sheet for Healthcare Providers: IncredibleEmployment.be  This test is not yet approved or cleared by the [place] FDA and has been authorized for detection and/or diagnosis  of SARS-CoV-2 by FDA under an Emergency Use Authorization (EUA). This EUA will remain in effect (meaning this test can be used) for the duration of the COVID-19 declaration under Section 564(b)(1) of the Act, 21 U.S.C. section 360bbb-3(b)(1), unless the authorization is terminated or revoked.  Performed at [facility], [address] [place], [place] [address]   Surgical pcr screen     Status: None   Collection Time: 08/28/21  6:53 PM   Specimen: Nasal Mucosa; Nasal Swab  Result Value Ref Range Status   MRSA, PCR NEGATIVE NEGATIVE Final    Staphylococcus aureus NEGATIVE NEGATIVE Final    Comment: (NOTE) The Xpert SA Assay (FDA approved for NASAL specimens in patients 51 years of age and older), is one component of a comprehensive surveillance program. It is not intended to diagnose infection nor to guide or monitor treatment. Performed at [facility], [address], [place], [place] [address]          Radiology Studies: No results found.      Scheduled Meds:  doxycycline  100 mg Oral Q12H   heparin  5,000 Units Subcutaneous Q8H   insulin aspart  0-5 Units Subcutaneous QHS   insulin aspart  0-9 Units Subcutaneous TID WC   insulin glargine-yfgn  10 Units Subcutaneous QHS   metoprolol succinate  50 mg Oral Daily   pramipexole  1.5 mg Oral BID   rosuvastatin  20 mg Oral Daily   Continuous Infusions:  sodium chloride       LOS: 1 day    Time spent: 26 minutes    Sharen Hones, MD [facility]   To contact the attending provider between 7A-7P or the covering provider during after hours 7P-7A, please log into the web site [email or web address] and access using universal [place] password for that web site. If you do not have the password, please call the hospital operator.  08/29/2021, 12:30 PM

## 2021-08-29 NOTE — Transfer of Care (Signed)
Immediate Anesthesia Transfer of Care Note  Patient: [name]  Procedure(s) Performed: AMPUTATION TOE-2nd toe (Right: Toe)  Patient Location: PACU  Anesthesia Type:General  Level of Consciousness: awake, alert  and oriented  Airway & Oxygen Therapy: Patient Spontanous Breathing  Post-op Assessment: Report given to RN and Post -op Vital signs reviewed and stable  Post vital signs: Reviewed and stable  Last Vitals:  Vitals Value Taken Time  BP    Temp    Pulse 75 08/29/21 1806  Resp    SpO2 100 % 08/29/21 1806  Vitals shown include unvalidated device data.  Last Pain:  Vitals:   08/29/21 1707  TempSrc: Temporal  PainSc: 0-No pain         Complications: No notable events documented.

## 2021-08-29 NOTE — Anesthesia Postprocedure Evaluation (Signed)
Anesthesia Post Note  Patient: Ricardo Brown  Procedure(s) Performed: AMPUTATION TOE-2nd toe (Right: Toe)  Patient location during evaluation: PACU Anesthesia Type: General Level of consciousness: awake and alert Pain management: pain level controlled Vital Signs Assessment: post-procedure vital signs reviewed and stable Respiratory status: spontaneous breathing, nonlabored ventilation and respiratory function stable Cardiovascular status: blood pressure returned to baseline and stable Postop Assessment: no apparent nausea or vomiting Anesthetic complications: no   No notable events documented.   Last Vitals:  Vitals:   08/29/21 1830 08/29/21 1846  BP: 135/72 126/75  Pulse: 76 75  Resp: 13 16  Temp: (!) 36.1 C 36.5 C  SpO2: 100% 100%    Last Pain:  Vitals:   08/29/21 1846  TempSrc: Oral  PainSc: 0-No pain                 [place] Ouch

## 2021-08-30 ENCOUNTER — Encounter: Payer: Self-pay | Admitting: Podiatry

## 2021-08-30 LAB — GLUCOSE, CAPILLARY
Glucose-Capillary: 177 mg/dL — ABNORMAL HIGH (ref 70–99)
Glucose-Capillary: 175 mg/dL — ABNORMAL HIGH (ref 70–99)
Glucose-Capillary: 243 mg/dL — ABNORMAL HIGH (ref 70–99)

## 2021-08-30 LAB — BASIC METABOLIC PANEL
Anion gap: 9 (ref 5–15)
BUN: 44 mg/dL — ABNORMAL HIGH (ref 6–20)
CO2: 25 mmol/L (ref 22–32)
Calcium: 8.9 mg/dL (ref 8.9–10.3)
Chloride: 100 mmol/L (ref 98–111)
Creatinine, Ser: 1.49 mg/dL — ABNORMAL HIGH (ref 0.61–1.24)
GFR, Estimated: 53 mL/min — ABNORMAL LOW (ref >60)
Glucose, Bld: 190 mg/dL — ABNORMAL HIGH (ref 70–99)
Potassium: 3.9 mmol/L (ref 3.5–5.1)
Sodium: 134 mmol/L — ABNORMAL LOW (ref 135–145)

## 2021-08-30 LAB — CBC WITH DIFFERENTIAL/PLATELET
Abs Immature Granulocytes: 0.1 10*3/uL — ABNORMAL HIGH (ref 0.00–0.07)
Basophils Absolute: 0.1 10*3/uL (ref 0.0–0.1)
Basophils Relative: 1 %
Eosinophils Absolute: 0.2 10*3/uL (ref 0.0–0.5)
Eosinophils Relative: 2 %
HCT: 34.8 % — ABNORMAL LOW (ref 39.0–52.0)
Hemoglobin: 11.6 g/dL — ABNORMAL LOW (ref 13.0–17.0)
Immature Granulocytes: 1 %
Lymphocytes Relative: 20 %
Lymphs Abs: 2.1 10*3/uL (ref 0.7–4.0)
MCH: 29.7 pg (ref 26.0–34.0)
MCHC: 33.3 g/dL (ref 30.0–36.0)
MCV: 89 fL (ref 80.0–100.0)
Monocytes Absolute: 0.6 10*3/uL (ref 0.1–1.0)
Monocytes Relative: 6 %
Neutro Abs: 7.4 10*3/uL (ref 1.7–7.7)
Neutrophils Relative %: 70 %
Platelets: 229 10*3/uL (ref 150–400)
RBC: 3.91 MIL/uL — ABNORMAL LOW (ref 4.22–5.81)
RDW: 12.3 % (ref 11.5–15.5)
WBC: 10.4 10*3/uL (ref 4.0–10.5)
nRBC: 0 % (ref 0.0–0.2)

## 2021-08-30 LAB — MAGNESIUM: Magnesium: 2.2 mg/dL (ref 1.7–2.4)

## 2021-08-30 MED ORDER — HYDROCODONE-ACETAMINOPHEN 5-325 MG PO TABS
1.0000 | ORAL_TABLET | ORAL | Status: DC | PRN
  Administered 2021-08-30 (×2): 2 via ORAL
  Filled 2021-08-30 (×2): qty 2

## 2021-08-30 MED ORDER — HYDROCODONE-ACETAMINOPHEN 5-325 MG PO TABS: 1.0000 | ORAL_TABLET | ORAL | 0 refills | Status: DC | PRN

## 2021-08-30 MED ORDER — TRAZODONE HCL 50 MG PO TABS: 50.0000 mg | ORAL_TABLET | Freq: Every day | ORAL | Status: DC

## 2021-08-30 MED ORDER — DOXYCYCLINE HYCLATE 100 MG PO TABS: 100.0000 mg | ORAL_TABLET | Freq: Two times a day (BID) | ORAL | 0 refills | Status: AC

## 2021-08-30 NOTE — Plan of Care (Signed)
Problem: Education: Goal: Knowledge of General Education information will improve Description: Including pain rating scale, medication(s)/side effects and non-pharmacologic comfort measures 08/30/2021 1808 by Montel Culver, RN Outcome: Adequate for Discharge 08/30/2021 1535 by Montel Culver, RN Outcome: Progressing   Problem: Health Behavior/Discharge Planning: Goal: Ability to manage health-related needs will improve 08/30/2021 1808 by Montel Culver, RN Outcome: Adequate for Discharge 08/30/2021 1535 by Montel Culver, RN Outcome: Progressing   Problem: Clinical Measurements: Goal: Ability to maintain clinical measurements within normal limits will improve 08/30/2021 1808 by Montel Culver, RN Outcome: Adequate for Discharge 08/30/2021 1535 by Montel Culver, RN Outcome: Progressing Goal: Will remain free from infection 08/30/2021 1808 by Montel Culver, RN Outcome: Adequate for Discharge 08/30/2021 1535 by Montel Culver, RN Outcome: Progressing Goal: Diagnostic test results will improve 08/30/2021 1808 by Montel Culver, RN Outcome: Adequate for Discharge 08/30/2021 1535 by Montel Culver, RN Outcome: Progressing Goal: Respiratory complications will improve 08/30/2021 1808 by Montel Culver, RN Outcome: Adequate for Discharge 08/30/2021 1535 by Montel Culver, RN Outcome: Progressing Goal: Cardiovascular complication will be avoided 08/30/2021 1808 by Montel Culver, RN Outcome: Adequate for Discharge 08/30/2021 1535 by Montel Culver, RN Outcome: Progressing   Problem: Activity: Goal: Risk for activity intolerance will decrease 08/30/2021 1808 by Montel Culver, RN Outcome: Adequate for Discharge 08/30/2021 1535 by Montel Culver, RN Outcome: Progressing   Problem: Nutrition: Goal: Adequate nutrition will be maintained 08/30/2021 1808 by Montel Culver, RN Outcome: Adequate for Discharge 08/30/2021 1535 by  Montel Culver, RN Outcome: Progressing   Problem: Coping: Goal: Level of anxiety will decrease 08/30/2021 1808 by Montel Culver, RN Outcome: Adequate for Discharge 08/30/2021 1535 by Montel Culver, RN Outcome: Progressing   Problem: Elimination: Goal: Will not experience complications related to bowel motility 08/30/2021 1808 by Montel Culver, RN Outcome: Adequate for Discharge 08/30/2021 1535 by Montel Culver, RN Outcome: Progressing Goal: Will not experience complications related to urinary retention 08/30/2021 1808 by Montel Culver, RN Outcome: Adequate for Discharge 08/30/2021 1535 by Montel Culver, RN Outcome: Progressing   Problem: Pain Managment: Goal: General experience of comfort will improve 08/30/2021 1808 by Montel Culver, RN Outcome: Adequate for Discharge 08/30/2021 1535 by Montel Culver, RN Outcome: Progressing   Problem: Safety: Goal: Ability to remain free from injury will improve 08/30/2021 1808 by Montel Culver, RN Outcome: Adequate for Discharge 08/30/2021 1535 by Montel Culver, RN Outcome: Progressing   Problem: Skin Integrity: Goal: Risk for impaired skin integrity will decrease 08/30/2021 1808 by Montel Culver, RN Outcome: Adequate for Discharge 08/30/2021 1535 by Montel Culver, RN Outcome: Progressing   Problem: Education: Goal: Ability to describe self-care measures that may prevent or decrease complications (Diabetes Survival Skills Education) will improve 08/30/2021 1808 by Montel Culver, RN Outcome: Adequate for Discharge 08/30/2021 1535 by Montel Culver, RN Outcome: Progressing Goal: Individualized Educational Video(s) 08/30/2021 1808 by Montel Culver, RN Outcome: Adequate for Discharge 08/30/2021 1535 by Montel Culver, RN Outcome: Progressing   Problem: Coping: Goal: Ability to adjust to condition or change in health will improve 08/30/2021 1808 by Montel Culver, RN Outcome: Adequate for Discharge 08/30/2021 1535 by Montel Culver, RN Outcome: Progressing   Problem: Fluid Volume: Goal: Ability to maintain a balanced intake and output will improve 08/30/2021 1808 by Montel Culver, RN Outcome: Adequate for Discharge 08/30/2021 1535 by Megan Salon,  Alcario Drought, RN Outcome: Progressing   Problem: Health Behavior/Discharge Planning: Goal: Ability to identify and utilize available resources and services will improve 08/30/2021 1808 by Montel Culver, RN Outcome: Adequate for Discharge 08/30/2021 1535 by Montel Culver, RN Outcome: Progressing Goal: Ability to manage health-related needs will improve 08/30/2021 1808 by Montel Culver, RN Outcome: Adequate for Discharge 08/30/2021 1535 by Montel Culver, RN Outcome: Progressing   Problem: Metabolic: Goal: Ability to maintain appropriate glucose levels will improve 08/30/2021 1808 by Montel Culver, RN Outcome: Adequate for Discharge 08/30/2021 1535 by Montel Culver, RN Outcome: Progressing   Problem: Nutritional: Goal: Maintenance of adequate nutrition will improve 08/30/2021 1808 by Montel Culver, RN Outcome: Adequate for Discharge 08/30/2021 1535 by Montel Culver, RN Outcome: Progressing Goal: Progress toward achieving an optimal weight will improve 08/30/2021 1808 by Montel Culver, RN Outcome: Adequate for Discharge 08/30/2021 1535 by Montel Culver, RN Outcome: Progressing   Problem: Skin Integrity: Goal: Risk for impaired skin integrity will decrease 08/30/2021 1808 by Montel Culver, RN Outcome: Adequate for Discharge 08/30/2021 1535 by Montel Culver, RN Outcome: Progressing   Problem: Tissue Perfusion: Goal: Adequacy of tissue perfusion will improve 08/30/2021 1808 by Montel Culver, RN Outcome: Adequate for Discharge 08/30/2021 1535 by Montel Culver, RN Outcome: Progressing

## 2021-08-30 NOTE — Discharge Summary (Signed)
Physician Discharge Summary  Patient ID: Ricardo Brown MRN: [id number] DOB/AGE: 61-Feb-1962 61 y.o.  Admit date: 08/28/2021 Discharge date: 08/30/2021  Admission Diagnoses:  Discharge Diagnoses:  Principal Problem:   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 Active Problems:   Diabetes mellitus type 2, uncomplicated (HCC)   HLD (hyperlipidemia)   Hypertension   PSVT (paroxysmal supraventricular tachycardia) (HCC)   Coronary artery disease   Chronic combined systolic (congestive) and diastolic (congestive) heart failure (HCC)   PAD (peripheral artery disease) ([place])   Discharged Condition: good  Hospital Course:  61 year old white male with a history of hypertension, type 2 diabetes, combined chronic systolic and diastolic heart failure, coronary disease status post CABG, history of SVT who presents to the ER today with worsening ischemia of his right second toe.  Amputation performed on 08/29/2021.  Right second toe ischemia with gangrene. Status post amputation. Patient is on doxycycline per podiatry. Dr. Cleda Mccreedy had reevaluated patient, recommended discharging patient today. Follow up with PCP and Dr. Cleda Mccreedy in one week  Acute kidney injury on chronic kidney disease stage IIIa. Hyponatremia. Renal function improved, creatinine 1.49.    Essential hypertension. Chronic combined systolic and diastolic congestive heart failure. Peripheral arterial disease. Conditions are stable.  Type 2 diabetes. Resume home regimen.   Consults:  Podiatry  Significant Diagnostic Studies:   Treatments: Toe amputation  Discharge Exam: Blood pressure 115/63, pulse 67, temperature 98.5 F (36.9 C), resp. rate 16, height 5\' 8"  (1.727 m), weight 92.4 kg, SpO2 98 %. General appearance: alert and cooperative Resp: clear to auscultation bilaterally Cardio: regular rate and rhythm, S1, S2 normal, no murmur, click, rub or gallop GI: soft, non-tender; bowel sounds normal; no masses,  no  organomegaly Extremities: extremities normal, atraumatic, no cyanosis or edema  Disposition: Discharge disposition: 01-Home or Self Care       Discharge Instructions     Diet Carb Modified   Complete by: As directed    Discharge wound care:   Complete by: As directed    Keep clean, follow with Dr. Cleda Mccreedy in 1 week   Increase activity slowly   Complete by: As directed       Allergies as of 08/30/2021       Reactions   Entresto [sacubitril-valsartan] Other (See Comments)   SVT   Lipitor [atorvastatin] Palpitations   SVT   Pregabalin Other (See Comments)        Medication List     TAKE these medications    aspirin 81 MG EC tablet Take 81 mg by mouth daily.   b complex vitamins tablet Take 1 tablet by mouth daily.   doxycycline 100 MG tablet Commonly known as: VIBRA-TABS Take 1 tablet (100 mg total) by mouth 2 (two) times daily for 5 days.   Dulaglutide 1.5 MG/0.5ML Sopn Inject 1.5 mg into the skin every Saturday.   empagliflozin 25 MG Tabs tablet Commonly known as: JARDIANCE Take 25 mg by mouth daily.   Fish Oil 875 MG Caps Take 1 capsule by mouth 2 (two) times daily.   gabapentin 100 MG capsule Commonly known as: Neurontin Take 1 capsule (100 mg total) by mouth 2 (two) times daily.   HYDROcodone-acetaminophen 5-325 MG tablet Commonly known as: NORCO/VICODIN Take 1 tablet by mouth every 4 (four) hours as needed for moderate pain.   insulin degludec 200 UNIT/ML FlexTouch Pen Commonly known as: TRESIBA 15 Units at bedtime.   losartan 100 MG tablet Commonly known as: COZAAR Take 1 tablet (100 mg  total) by mouth daily.   metFORMIN 500 MG tablet Commonly known as: GLUCOPHAGE Take 2 tablets (1,000 mg total) by mouth 2 (two) times daily.   metolazone 2.5 MG tablet Commonly known as: ZAROXOLYN Take 1 tablet (2.5 mg) by mouth once a week on Wednesdays. Take 30 mins prior to your Torsemide.   metoprolol succinate 50 MG 24 hr tablet Commonly known  as: TOPROL-XL Take 1 tablet (50 mg total) by mouth daily. Take with or immediately following a meal.   pramipexole 1.5 MG tablet Commonly known as: MIRAPEX Take 1 tablet (1.5 mg total) by mouth 2 (two) times daily.   rosuvastatin 20 MG tablet Commonly known as: CRESTOR Take 1 tablet (20 mg total) by mouth daily.   spironolactone 25 MG tablet Commonly known as: ALDACTONE Take 1 tablet (25 mg total) by mouth daily.   torsemide 20 MG tablet Commonly known as: DEMADEX Take 2 tablets (40 mg total) by mouth daily. 40 mg daily   Zenpep 40000-126000 units Cpep Generic drug: Pancrelipase (Lip-Prot-Amyl) Take 80,000 Units by mouth 3 (three) times daily with meals AND 40,000 Units with snacks.               Discharge Care Instructions  (From admission, onward)           Start     Ordered   08/30/21 0000  Discharge wound care:       Comments: Keep clean, follow with Dr. Cleda Mccreedy in 1 week   08/30/21 1755            Follow-up Information     [name], MD Follow up in 1 week(s).   Specialties: Internal Medicine, Cardiology Contact information: [place] [address] [phone]         [name], MD .   Specialties: Cardiology, Radiology Contact information: [place] [address] [phone]         Sharlotte Alamo, DPM Follow up in 1 week(s).   Specialty: Podiatry Contact information: [place] [address] [phone]                 Signed: Sharen Hones 08/30/2021, 5:55 PM

## 2021-08-30 NOTE — Progress Notes (Signed)
Patient A&O, VSS. Dressing on right foot clean, dry, and intact. Minimal pain, controlled with PO medication. Postop shoe at bedside. Patient up to chair with one assist. Call bell in reach. No complaints at this time.

## 2021-08-30 NOTE — Progress Notes (Signed)
PROGRESS NOTE    Ricardo Brown  HKV:[id number] DOB: 12/28/1960 DOA: 08/28/2021 PCP: [name], MD    Brief Narrative:  61 year old white male with a history of hypertension, type 2 diabetes, combined chronic systolic and diastolic heart failure, coronary disease status post CABG, history of SVT who presents to the ER today with worsening ischemia of his right second toe.  Amputation performed on 08/29/2021.   Assessment & Plan:   Principal Problem:   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 Active Problems:   Diabetes mellitus type 2, uncomplicated (HCC)   HLD (hyperlipidemia)   Hypertension   PSVT (paroxysmal supraventricular tachycardia) (HCC)   Coronary artery disease   Chronic combined systolic (congestive) and diastolic (congestive) heart failure (HCC)   PAD (peripheral artery disease) (HCC)     Right second toe ischemia with gangrene. Status post amputation. Patient is on doxycycline per podiatry. Discussed with Dr. Cleda Mccreedy, he would like to keep patient today, consider discharge tomorrow.  Acute kidney injury on chronic kidney disease stage IIIa. Hyponatremia. Renal function improved, creatinine 1.49.  Discontinue IV fluids   Essential hypertension. Chronic combined systolic and diastolic congestive heart failure. Peripheral arterial disease. Conditions are stable.  Type 2 diabetes. Continue current regimen.   DVT prophylaxis: Heparin Code Status: full Family Communication:  Disposition Plan:      Status is: Inpatient   Remains inpatient appropriate because: Severity disease,      I/O last 3 completed shifts: In: 1198 [I.V.:1148; IV Piggyback:50] Out: 2430 [Urine:2425; Blood:5] Total I/O In: 582.6 [P.O.:240; I.V.:342.6] Out: 850 [Urine:850]    Subjective: Patient is doing well, currently has no complaints. No fever chills  Objective: Vitals:   08/30/21 0438 08/30/21 0443 08/30/21 0740 08/30/21 1128  BP: 106/63  111/70 122/64  Pulse: 75  72  69  Resp: 16  15 16   Temp: 98.6 F (37 C)  98.3 F (36.8 C) 97.8 F (36.6 C)  TempSrc: Oral     SpO2: 98%  97% 100%  Weight:  92.4 kg    Height:        Intake/Output Summary (Last 24 hours) at 08/30/2021 1319 Last data filed at 08/30/2021 1100 Gross per 24 hour  Intake 1780.54 ml  Output 2380 ml  Net -599.46 ml   Filed Weights   08/29/21 0500 08/29/21 1707 08/30/21 0443  Weight: 88.6 kg 88.6 kg 92.4 kg    Examination:  General exam: Appears calm and comfortable  Respiratory system: Clear to auscultation. Respiratory effort normal. Cardiovascular system: S1 & S2 heard, RRR. No JVD, murmurs, rubs, gallops or clicks. No pedal edema. Gastrointestinal system: Abdomen is nondistended, soft and nontender. No organomegaly or masses felt. Normal bowel sounds heard. Central nervous system: Alert and oriented. No focal neurological deficits. Extremities: Symmetric 5 x 5 power. Skin: No rashes, lesions or ulcers Psychiatry: Judgement and insight appear normal. Mood & affect appropriate.     Data Reviewed: I have personally reviewed following labs and imaging studies  CBC: Recent Labs  Lab 08/28/21 1106 08/29/21 0639 08/30/21 0636  WBC 12.8* 9.2 10.4  NEUTROABS 9.0* 6.1 7.4  HGB 13.5 12.6* 11.6*  HCT 40.8 36.4* 34.8*  MCV 88.3 85.8 89.0  PLT 311 252 564   Basic Metabolic Panel: Recent Labs  Lab 08/26/21 0952 08/28/21 1106 08/29/21 0639 08/30/21 0636  NA 136 132* 130* 134*  K 4.8 5.0 3.8 3.9  CL 96 94* 94* 100  CO2 23 24 27 25   GLUCOSE 169* 160* 147* 190*  BUN 69* 71* 67* 44*  CREATININE 2.51* 2.11* 1.83* 1.49*  CALCIUM 9.0 9.8 9.1 8.9  MG  --   --  2.1 2.2   GFR: Estimated Creatinine Clearance: 58.2 mL/min (A) (by C-G formula based on SCr of 1.49 mg/dL (H)). Liver Function Tests: Recent Labs  Lab 08/28/21 1106 08/29/21 0639  AST 25 19  ALT 22 19  ALKPHOS 51 47  BILITOT 0.6 0.6  PROT 8.2* 7.0  ALBUMIN 4.1 3.4*   No results for input(s): LIPASE,  AMYLASE in the last 168 hours. No results for input(s): AMMONIA in the last 168 hours. Coagulation Profile: No results for input(s): INR, PROTIME in the last 168 hours. Cardiac Enzymes: No results for input(s): CKTOTAL, CKMB, CKMBINDEX, TROPONINI in the last 168 hours. BNP (last 3 results) No results for input(s): PROBNP in the last 8760 hours. HbA1C: Recent Labs    08/28/21 1106  HGBA1C 7.4*   CBG: Recent Labs  Lab 08/29/21 1641 08/29/21 1808 08/29/21 2209 08/30/21 0740 08/30/21 1129  GLUCAP 108* 123* 205* 177* 175*   Lipid Profile: No results for input(s): CHOL, HDL, LDLCALC, TRIG, CHOLHDL, LDLDIRECT in the last 72 hours. Thyroid Function Tests: No results for input(s): TSH, T4TOTAL, FREET4, T3FREE, THYROIDAB in the last 72 hours. Anemia Panel: No results for input(s): VITAMINB12, FOLATE, FERRITIN, TIBC, IRON, RETICCTPCT in the last 72 hours. Sepsis Labs: No results for input(s): PROCALCITON, LATICACIDVEN in the last 168 hours.  Recent Results (from the past 240 hour(s))  Resp Panel by RT-PCR (Flu A&B, Covid) Nasopharyngeal Swab     Status: None   Collection Time: 08/28/21 12:13 PM   Specimen: Nasopharyngeal Swab; Nasopharyngeal(NP) swabs in vial transport medium  Result Value Ref Range Status   SARS Coronavirus 2 by RT PCR NEGATIVE NEGATIVE Final    Comment: (NOTE) SARS-CoV-2 target nucleic acids are NOT DETECTED.  The SARS-CoV-2 RNA is generally detectable in upper respiratory specimens during the acute phase of infection. The lowest concentration of SARS-CoV-2 viral copies this assay can detect is 138 copies/mL. A negative result does not preclude SARS-Cov-2 infection and should not be used as the sole basis for treatment or other patient management decisions. A negative result may occur with  improper specimen collection/handling, submission of specimen other than nasopharyngeal swab, presence of viral mutation(s) within the areas targeted by this assay, and  inadequate number of viral copies(<138 copies/mL). A negative result must be combined with clinical observations, patient history, and epidemiological information. The expected result is Negative.  Fact Sheet for Patients:  EntrepreneurPulse.com.au  Fact Sheet for Healthcare Providers:  IncredibleEmployment.be  This test is no t yet approved or cleared by the [place] FDA and  has been authorized for detection and/or diagnosis of SARS-CoV-2 by FDA under an Emergency Use Authorization (EUA). This EUA will remain  in effect (meaning this test can be used) for the duration of the COVID-19 declaration under Section 564(b)(1) of the Act, 21 U.S.C.section 360bbb-3(b)(1), unless the authorization is terminated  or revoked sooner.       Influenza A by PCR NEGATIVE NEGATIVE Final   Influenza B by PCR NEGATIVE NEGATIVE Final    Comment: (NOTE) The Xpert Xpress SARS-CoV-2/FLU/RSV plus assay is intended as an aid in the diagnosis of influenza from Nasopharyngeal swab specimens and should not be used as a sole basis for treatment. Nasal washings and aspirates are unacceptable for Xpert Xpress SARS-CoV-2/FLU/RSV testing.  Fact Sheet for Patients: EntrepreneurPulse.com.au  Fact Sheet for Healthcare Providers: IncredibleEmployment.be  This  test is not yet approved or cleared by the [place] and has been authorized for detection and/or diagnosis of SARS-CoV-2 by FDA under an Emergency Use Authorization (EUA). This EUA will remain in effect (meaning this test can be used) for the duration of the COVID-19 declaration under Section 564(b)(1) of the Act, 21 U.S.C. section 360bbb-3(b)(1), unless the authorization is terminated or revoked.  Performed at [facility], [address], [place], [place] [address]   Surgical pcr screen     Status: None   Collection Time: 08/28/21  6:53 PM   Specimen:  Nasal Mucosa; Nasal Swab  Result Value Ref Range Status   MRSA, PCR NEGATIVE NEGATIVE Final   Staphylococcus aureus NEGATIVE NEGATIVE Final    Comment: (NOTE) The Xpert SA Assay (FDA approved for NASAL specimens in patients 41 years of age and older), is one component of a comprehensive surveillance program. It is not intended to diagnose infection nor to guide or monitor treatment. Performed at [facility], [place], [place] [address]   Aerobic/Anaerobic Culture w Gram Stain (surgical/deep wound)     Status: None (Preliminary result)   Collection Time: 08/29/21  5:42 PM   Specimen: PATH Other; Tissue  Result Value Ref Range Status   Specimen Description TISSUE TOE  Final   Special Requests 2ND RIGHT  Final   Gram Stain   Final    NO SQUAMOUS EPITHELIAL CELLS SEEN FEW WBC SEEN FEW GRAM NEGATIVE RODS MODERATE GRAM POSITIVE COCCI    Culture   Final    TOO YOUNG TO READ Performed at [facility], [place] [address] [place], [place] [address]    Report Status PENDING  Incomplete         Radiology Studies: No results found.      Scheduled Meds:  doxycycline  100 mg Oral Q12H   heparin  5,000 Units Subcutaneous Q8H   insulin aspart  0-5 Units Subcutaneous QHS   insulin aspart  0-9 Units Subcutaneous TID WC   insulin glargine-yfgn  10 Units Subcutaneous QHS   metoprolol succinate  50 mg Oral Daily   pramipexole  1.5 mg Oral BID   rosuvastatin  20 mg Oral Daily   traZODone  50 mg Oral QHS   Continuous Infusions:   LOS: 2 days    Time spent: 22 minutes    Sharen Hones, MD [facility]   To contact the attending provider between 7A-7P or the covering provider during after hours 7P-7A, please log into the web site [email or web address] and access using universal [place] password for that web site. If you do not have the password, please call the hospital operator.  08/30/2021, 1:19 PM

## 2021-08-30 NOTE — Plan of Care (Signed)

## 2021-08-30 NOTE — Progress Notes (Signed)
1 Day Post-Op   Subjective/Chief Complaint: Patient seen.  Some pain at the amputation site but manageable.   Objective: Vital signs in last 24 hours: Temp:  [97 F (36.1 C)-98.6 F (37 C)] 98.5 F (36.9 C) (01/28 1614) Pulse Rate:  [67-77] 67 (01/28 1614) Resp:  [13-17] 16 (01/28 1614) BP: (106-135)/(62-75) 115/63 (01/28 1614) SpO2:  [97 %-100 %] 98 % (01/28 1614) Weight:  [92.4 kg] 92.4 kg (01/28 0443) Last BM Date: 08/29/21  Intake/Output from previous day: 01/27 0701 - 01/28 0700 In: 1198 [I.V.:1148; IV Piggyback:50] Out: 5625 [Urine:1825; Blood:5] Intake/Output this shift: Total I/O In: 582.6 [P.O.:240; I.V.:342.6] Out: 850 [Urine:850]  Bandage on the right foot is dry and intact.  Minimal dried blood noted on the bandaging.  Upon removal of the incision is well coapted with no clear evidence of incisional necrosis.  Some mild erythema.  No active drainage.  Lab Results:  Recent Labs    08/29/21 0639 08/30/21 0636  WBC 9.2 10.4  HGB 12.6* 11.6*  HCT 36.4* 34.8*  PLT 252 229   BMET Recent Labs    08/29/21 0639 08/30/21 0636  NA 130* 134*  K 3.8 3.9  CL 94* 100  CO2 27 25  GLUCOSE 147* 190*  BUN 67* 44*  CREATININE 1.83* 1.49*  CALCIUM 9.1 8.9   PT/INR No results for input(s): LABPROT, INR in the last 72 hours. ABG No results for input(s): PHART, HCO3 in the last 72 hours.  Invalid input(s): PCO2, PO2  Studies/Results: No results found.  Anti-infectives: Anti-infectives (From admission, onward)    Start     Dose/Rate Route Frequency Ordered Stop   08/30/21 0000  doxycycline (VIBRA-TABS) 100 MG tablet        100 mg Oral 2 times daily 08/30/21 1755 09/04/21 2359   08/29/21 1712  ceFAZolin (ANCEF) 2-4 GM/100ML-% IVPB       Note to Pharmacy: [place] [facility], [place]: cabinet override      08/29/21 1712 08/30/21 0529   08/28/21 1215  doxycycline (VIBRA-TABS) tablet 100 mg        100 mg Oral Every 12 hours 08/28/21 1204 09/04/21 0959        Assessment/Plan: s/p Procedure(s): AMPUTATION TOE-2nd toe (Right) Assessment: Stable status post amputation right second toe.  Plan: Betadine and a sterile bandage reapplied to the right foot and amputation site.  Instructed the patient to keep this clean, dry, and do not remove.  Resume taking his doxycycline at home that he was taking prior to his admission.  Discussed with the patient follow-up middle part of next week.  Discussed with the patient that he may be weightbearing on the right foot in his postoperative surgical shoe with pressure on the heel.  Patient is stable for discharge from podiatry standpoint.  LOS: 2 days    [name] 08/30/2021

## 2021-09-01 ENCOUNTER — Telehealth: Payer: Self-pay | Admitting: Cardiology

## 2021-09-01 MED ORDER — LOSARTAN POTASSIUM 100 MG PO TABS: 100.0000 mg | ORAL_TABLET | Freq: Every day | ORAL | 0 refills | Status: DC

## 2021-09-01 MED ORDER — ROSUVASTATIN CALCIUM 20 MG PO TABS: 20.0000 mg | ORAL_TABLET | Freq: Every day | ORAL | 0 refills | Status: DC

## 2021-09-01 NOTE — Telephone Encounter (Signed)
°*  STAT* If patient is at the pharmacy, call can be transferred to refill team.   1. Which medications need to be refilled? (please list name of each medication and dose if known)  rosuvastatin 20 MG 1 tablet daily Losartan 100 MG 1 tablet daily    2. Which pharmacy/location (including street and city if local pharmacy) is medication to be sent to? [facility] on [address]   3. Do they need a 30 day or 90 day supply? 90 day

## 2021-09-01 NOTE — Telephone Encounter (Signed)
Requested Prescriptions   Signed Prescriptions Disp Refills   rosuvastatin (CRESTOR) 20 MG tablet 90 tablet 0    Sig: Take 1 tablet (20 mg total) by mouth daily.    Authorizing Provider: [name]    Ordering User: Raelene Bott, Harshal Sirmon L   losartan (COZAAR) 100 MG tablet 90 tablet 0    Sig: Take 1 tablet (100 mg total) by mouth daily.    Authorizing Provider: [name]    Ordering User: Raelene Bott, Deb Loudin L

## 2021-09-02 LAB — SURGICAL PATHOLOGY

## 2021-09-04 ENCOUNTER — Ambulatory Visit: Payer: 59 | Admitting: Nurse Practitioner

## 2021-09-05 ENCOUNTER — Other Ambulatory Visit: Payer: Self-pay

## 2021-09-05 ENCOUNTER — Ambulatory Visit (INDEPENDENT_AMBULATORY_CARE_PROVIDER_SITE_OTHER): Payer: 59 | Admitting: Nurse Practitioner

## 2021-09-05 ENCOUNTER — Encounter: Payer: Self-pay | Admitting: Nurse Practitioner

## 2021-09-05 VITALS — BP 108/60 | HR 78 | Ht 68.0 in | Wt 204.8 lb

## 2021-09-05 DIAGNOSIS — N179 Acute kidney failure, unspecified: Secondary | ICD-10-CM

## 2021-09-05 DIAGNOSIS — I2510 Atherosclerotic heart disease of native coronary artery without angina pectoris: Secondary | ICD-10-CM | POA: Diagnosis not present

## 2021-09-05 DIAGNOSIS — I5042 Chronic combined systolic (congestive) and diastolic (congestive) heart failure: Secondary | ICD-10-CM | POA: Diagnosis not present

## 2021-09-05 DIAGNOSIS — I471 Supraventricular tachycardia, unspecified: Secondary | ICD-10-CM

## 2021-09-05 DIAGNOSIS — E785 Hyperlipidemia, unspecified: Secondary | ICD-10-CM

## 2021-09-05 DIAGNOSIS — N183 Chronic kidney disease, stage 3 unspecified: Secondary | ICD-10-CM

## 2021-09-05 MED ORDER — METOLAZONE 2.5 MG PO TABS: ORAL_TABLET | ORAL | Status: DC

## 2021-09-05 NOTE — Progress Notes (Signed)
Office Visit    Patient Name: Ricardo Brown Date of Encounter: 09/05/2021  Primary Care Provider:  [name], MD Primary Cardiologist:  [name], MD  Chief Complaint    61 year old male with history of CAD status post three-vessel CABG in June 2019, hypertension, hyperlipidemia, chronic combined systolic diastolic congestive heart failure, ischemic cardiomyopathy, CKD II-Brown, mitral irritation status post mitral valve repair, postoperative atrial fibrillation, and PSVT, who presents for follow-up related to lower extremity edema.  Past Medical History    Past Medical History:  Diagnosis Date   Chronic combined systolic (congestive) and diastolic (congestive) heart failure ([place])    a. 01/2018 EF 35-40%; b. 10/2020 Echo: EF 40-45%, glob HK. RVSP 42.93mmHg. Mildly dil LA. Mild MS/AoV sclerosis.   CKD (chronic kidney disease), stage Brown (HCC)    Coronary artery disease    a. 01/2018 late presenting inferior MI; b. 01/2018 Cath: Severe multivessel dzs-->CABG x 3 (LIMA->LAD, VG->OM2, VG->RPDA @ [facility]).   Diabetes mellitus without complication ([place])    a. Dx ~ 2000.   Heart palpitations    a. Pt reports prior nl echo's and stress tests. Last stress test w/in past 2 yrs - PCP.   High cholesterol    Hypertension    Mitral regurgitation    a. 01/2018 s/p MV repair @ time of CABG.   Myocardial infarction [facility])    Postoperative atrial fibrillation    a. 01/2018 @ time of CABG.   PSVT (paroxysmal supraventricular tachycardia) ([place])    a. Very symptomatic with multiple ED evaluations.  Has been on amio.   STEMI (ST elevation myocardial infarction) ([place]) 01/24/2018   Past Surgical History:  Procedure Laterality Date   AMPUTATION TOE Right 08/29/2021   Procedure: AMPUTATION TOE-2nd toe;  Surgeon: Sharlotte Alamo, DPM;  Location: [facility] ORS;  Service: Podiatry;  Laterality: Right;   CARDIAC CATHETERIZATION     CARDIAC VALVE REPLACEMENT     Mitral Valve Repair   COLONOSCOPY WITH  PROPOFOL N/A 06/07/2019   Procedure: COLONOSCOPY WITH PROPOFOL;  Surgeon: [name], MD;  Location: [facility] ENDOSCOPY;  Service: Endoscopy;  Laterality: N/A;   COLONOSCOPY WITH PROPOFOL N/A 02/07/2020   Procedure: COLONOSCOPY WITH PROPOFOL;  Surgeon: [name], MD;  Location: [facility] ENDOSCOPY;  Service: Endoscopy;  Laterality: N/A;   COLONOSCOPY WITH PROPOFOL N/A 12/11/2020   Procedure: COLONOSCOPY WITH PROPOFOL;  Surgeon: [name], MD;  Location: [facility] ENDOSCOPY;  Service: Endoscopy;  Laterality: N/A;   CORONARY ANGIOPLASTY     CORONARY ARTERY BYPASS GRAFT     x 3   01/2018   ESOPHAGOGASTRODUODENOSCOPY N/A 02/07/2020   Procedure: ESOPHAGOGASTRODUODENOSCOPY (EGD);  Surgeon: [name], MD;  Location: [facility] ENDOSCOPY;  Service: Endoscopy;  Laterality: N/A;   LEFT HEART CATH AND CORONARY ANGIOGRAPHY N/A 01/24/2018   Procedure: LEFT HEART CATH AND CORONARY ANGIOGRAPHY;  Surgeon: [name], MD;  Location: [place] CV LAB;  Service: Cardiovascular;  Laterality: N/A;   TONSILLECTOMY      Allergies  Allergies  Allergen Reactions   Entresto [Sacubitril-Valsartan] Other (See Comments)    SVT   Lipitor [Atorvastatin] Palpitations    SVT   Pregabalin Other (See Comments)    History of Present Illness    61 year old male with the above past medical history including CAD, hypertension, hyperlipidemia, chronic combined systolic and diastolic congestive heart failure, ischemic cardiomyopathy, mitral regurgitation status post mitral valve repair, CKD II-Brown, postoperative atrial fibrillation, and PSVT.  In June 2019, he  was admitted with late presenting inferior MI and underwent diagnostic catheterization revealing severe multivessel disease.  He was subsequently transferred to [facility] and underwent CABG x3.  EF was 35 to 40% at that time and subsequent echoes have shown stabilization at 40 to 45% with global hypokinesis.  Most recent echo in March 2022, also  showed an elevated RVSP of 42.9 mmHg.  Following his bypass surgery, he did have postoperative atrial fibrillation and was on amiodarone for a brief period of time.  After discontinuation, he had recurrent SVT, which he notes that he had for about 20 years, and was placed back on amiodarone.  Despite ongoing amiodarone therapy, he is continue to have intermittent episodes of SVT, sometimes requiring ED evaluation and adenosine.  He has been seen by electrophysiology with plan for SVT ablation on February 10.  Mr. Sisney was last seen in cardiology clinic on January 25 after being placed on daily metolazone and torsemide a week earlier for ongoing lower extremity edema.  With the addition of metolazone, BUN and creatinine bumped at 69 and 2.51 and this was reduced to once weekly at January 25 visit.  At the time, he had been dealing with right toe pain with plan for follow-up with podiatry.  After having ABIs performed on January 25, he was contacted by podiatry on January 26 and advised to present to the emergency department for admission and surgical amputation of the right second toe.  This was performed on January 27, and he was discharged home on January 28.   During his admission, his BUN/creatinine continued to improve and were 44 and 1.49 on discharge.  Since discharge, he has felt well.  He feels that his lower ext swelling is slightly better.  He has not taken any metolazone this week and cont on torsemide 40 daily.  Wt has been stable.  In light of R toe amputation, he hasn't been wrapping his RLE, but plans to use an ACE wrap once his toe heals.  He denies chest pain, palpitations, dyspnea, pnd, orthopnea, n, v, dizziness, syncope, weight gain, or early satiety.  Of note, he has been off of amiodarone since his last visit, as advised by EP.  Home Medications    Current Outpatient Medications  Medication Sig Dispense Refill   aspirin 81 MG EC tablet Take 81 mg by mouth daily.      b complex  vitamins tablet Take 1 tablet by mouth daily.      Dulaglutide 1.5 MG/0.5ML SOPN Inject 1.5 mg into the skin every Saturday.     empagliflozin (JARDIANCE) 25 MG TABS tablet Take 25 mg by mouth daily.     gabapentin (NEURONTIN) 100 MG capsule Take 1 capsule (100 mg total) by mouth 2 (two) times daily. 60 capsule 1   HYDROcodone-acetaminophen (NORCO/VICODIN) 5-325 MG tablet Take 1 tablet by mouth every 4 (four) hours as needed for moderate pain. 10 tablet 0   insulin degludec (TRESIBA) 200 UNIT/ML FlexTouch Pen 15 Units at bedtime.     losartan (COZAAR) 100 MG tablet Take 1 tablet (100 mg total) by mouth daily. 90 tablet 0   metFORMIN (GLUCOPHAGE) 500 MG tablet Take 2 tablets (1,000 mg total) by mouth 2 (two) times daily. 360 tablet 2   metoprolol succinate (TOPROL-XL) 50 MG 24 hr tablet Take 1 tablet (50 mg total) by mouth daily. Take with or immediately following a meal. 90 tablet 3   Omega-3 Fatty Acids (FISH OIL) 875 MG CAPS Take 1 capsule by  mouth 2 (two) times daily.     Pancrelipase, Lip-Prot-Amyl, (ZENPEP) 40000-126000 units CPEP Take 80,000 Units by mouth 3 (three) times daily with meals AND 40,000 Units with snacks. 600 capsule 1   pramipexole (MIRAPEX) 1.5 MG tablet Take 1 tablet (1.5 mg total) by mouth 2 (two) times daily. 180 tablet 3   rosuvastatin (CRESTOR) 20 MG tablet Take 1 tablet (20 mg total) by mouth daily. 90 tablet 0   spironolactone (ALDACTONE) 25 MG tablet Take 1 tablet (25 mg total) by mouth daily. 90 tablet 1   torsemide (DEMADEX) 20 MG tablet Take 2 tablets (40 mg total) by mouth daily. 40 mg daily 180 tablet 3   metolazone (ZAROXOLYN) 2.5 MG tablet Take 1 tablet (2.5 mg) by mouth once a week as needed for weight gain greater than 3 lbs     No current facility-administered medications for this visit.     Review of Systems    Mild R>L lower ext edema.  No wt gain.  He denies chest pain, palpitations, dyspnea, pnd, orthopnea, n, v, dizziness, syncope, weight gain, or  early satiety.  All other systems reviewed and are otherwise negative except as noted above.    Physical Exam    VS:  BP 108/60    Pulse 78    Ht 5\' 8"  (1.727 m)    Wt 204 lb 12.8 oz (92.9 kg)    BMI 31.14 kg/m  , BMI Body mass index is 31.14 kg/m.     GEN: Well nourished, well developed, in no acute distress. HEENT: normal. Neck: Supple, no JVD, carotid bruits, or masses. Cardiac: RRR, no murmurs, rubs, or gallops. No clubbing, cyanosis.  Trace left lower leg edema w/ 1+ right lower leg edema.  R foot and ankle are wrapped.  Radials 2+.[name]  Respiratory:  Respirations regular and unlabored, clear to auscultation bilaterally. GI: Soft, nontender, nondistended, BS + x 4. MS: no deformity or atrophy. Skin: warm and dry, no rash. Neuro:  Strength and sensation are intact. Psych: Normal affect.  Accessory Clinical Findings     Lab Results  Component Value Date   WBC 10.4 08/30/2021   HGB 11.6 (L) 08/30/2021   HCT 34.8 (L) 08/30/2021   MCV 89.0 08/30/2021   PLT 229 08/30/2021   Lab Results  Component Value Date   CREATININE 1.49 (H) 08/30/2021   BUN 44 (H) 08/30/2021   NA 134 (L) 08/30/2021   K 3.9 08/30/2021   CL 100 08/30/2021   CO2 25 08/30/2021   Lab Results  Component Value Date   ALT 19 08/29/2021   AST 19 08/29/2021   ALKPHOS 47 08/29/2021   BILITOT 0.6 08/29/2021   Lab Results  Component Value Date   CHOL 122 09/30/2020   HDL 35 (L) 09/30/2020   LDLCALC 67 09/30/2020   TRIG 121 09/30/2020   CHOLHDL 3.5 09/30/2020    Lab Results  Component Value Date   HGBA1C 7.4 (H) 08/28/2021    Assessment & Plan    1.  Chronic combined systolic and diastolic congestive heart failure: EF 40 to 45% by echo in March 2022.  Seen last week secondary to lower extremity swelling and elevation in BUN and creatinine on daily metolazone.  Metolazone was reduced to once weekly though, he has not taken it this week.  Over the past week, he was admitted to [facility] regional and  underwent right second toe amputation.  He did receive IV fluids during hospitalization with a discharge BUN and  creatinine of 44 and 1.49.  He is now back on torsemide 40 mg daily.  His weight is stable on his home scale, as well as our scale today.  He has trace left lower extremity swelling with 1+ right lower extremity swelling.  He otherwise feels well.  I will continue current dose of diuretic with ongoing recommendation for elevation and sodium avoidance at home.  He plans to use an Ace bandage to his right lower extremity to apply some compression, once his right second toe heals.  Continue beta-blocker, ARB, empagliflozin, and spironolactone.  He will use metolazone 2.5 mg weekly as needed for weight gain of 3 pounds or more.  He understands that he will need to call us if weight weight does not improve with as needed metolazone.  2.  Coronary artery disease: Status post CABG x3 in 2019.  No chest pain.  He has chronic dyspnea on exertion which has been stable since his bypass surgery.  He remains on aspirin, beta-blocker, and statin therapy.  3.  Peripheral arterial disease: Status post right second toe amputation last week.  Followed by podiatry and healing well per visit yesterday.  He remains on aspirin and statin therapy.  4.  Essential hypertension: Stable.  5.  Hyperlipidemia: LDL of 67 last year.  Continue statin therapy.  6.  Acute kidney injury: This occurred in the setting of daily metolazone and as noted above, BUN and creatinine improved to 44 and 1.49 on January 28.  7.  PSVT: Evaluated by electrophysiology with plan for catheter ablation on February 10.  Patient has been off of amiodarone for the past week without palpitations.  Continue beta-blocker.  8.  History of mitral valve repair: Stable by echo in March 2022.  9.  Disposition: Scheduled for SVT catheter ablation February 10.  He has cardiology follow-up on February 17.  Murray Hodgkins, NP 09/05/2021, 2:55 PM

## 2021-09-05 NOTE — Patient Instructions (Signed)
Medication Instructions:  Your physician has recommended you make the following change in your medication:   CHANGE Metolazone to 2.5 mg weekly as needed for weight gain >3lbs.  *If you need a refill on your cardiac medications before your next appointment, please call your pharmacy*   Lab Work: None ordered If you have labs (blood work) drawn today and your tests are completely normal, you will receive your results only by: [place] (if you have MyChart) OR A paper copy in the mail If you have any lab test that is abnormal or we need to change your treatment, we will call you to review the results.   Testing/Procedures: None ordered   Follow-Up: At [facility], you and your health needs are our priority.  As part of our continuing mission to provide you with exceptional heart care, we have created designated Provider Care Teams.  These Care Teams include your primary Cardiologist (physician) and Advanced Practice Providers (APPs -  Physician Assistants and Nurse Practitioners) who all work together to provide you with the care you need, when you need it.  We recommend signing up for the patient portal called "MyChart".  Sign up information is provided on this After Visit Summary.  MyChart is used to connect with patients for Virtual Visits (Telemedicine).  Patients are able to view lab/test results, encounter notes, upcoming appointments, etc.  Non-urgent messages can be sent to your provider as well.   To learn more about what you can do with MyChart, go to [email or web address].    Your next appointment:   As planned   The format for your next appointment:   In Person  Provider:   [name], MD  Lars Mage, MD    Other Instructions N/A

## 2021-09-06 LAB — MISC LABCORP TEST (SEND OUT)
LabCorp test name: 182261
Labcorp test code: 182261

## 2021-09-09 ENCOUNTER — Inpatient Hospital Stay: Payer: 59 | Admitting: Internal Medicine

## 2021-09-10 ENCOUNTER — Ambulatory Visit (INDEPENDENT_AMBULATORY_CARE_PROVIDER_SITE_OTHER): Payer: 59 | Admitting: Internal Medicine

## 2021-09-10 ENCOUNTER — Encounter: Payer: Self-pay | Admitting: Internal Medicine

## 2021-09-10 ENCOUNTER — Other Ambulatory Visit: Payer: Self-pay

## 2021-09-10 VITALS — BP 127/71 | HR 89 | Ht 68.0 in | Wt 203.2 lb

## 2021-09-10 DIAGNOSIS — I10 Essential (primary) hypertension: Secondary | ICD-10-CM

## 2021-09-10 DIAGNOSIS — E1169 Type 2 diabetes mellitus with other specified complication: Secondary | ICD-10-CM | POA: Diagnosis not present

## 2021-09-10 DIAGNOSIS — E119 Type 2 diabetes mellitus without complications: Secondary | ICD-10-CM

## 2021-09-10 DIAGNOSIS — I471 Supraventricular tachycardia: Secondary | ICD-10-CM

## 2021-09-10 DIAGNOSIS — Z794 Long term (current) use of insulin: Secondary | ICD-10-CM | POA: Diagnosis not present

## 2021-09-10 DIAGNOSIS — I998 Other disorder of circulatory system: Secondary | ICD-10-CM

## 2021-09-10 LAB — GLUCOSE, POCT (MANUAL RESULT ENTRY): POC Glucose: 116 mg/dl — AB (ref 70–99)

## 2021-09-10 NOTE — Assessment & Plan Note (Signed)
Patient is planned to have ablation

## 2021-09-10 NOTE — Progress Notes (Addendum)
Established Patient Office Visit  Subjective:  Patient ID: Ricardo Brown, male    DOB: [birth year]  Age: 61 y.o. MRN: [id number]  CC:  Chief Complaint  Patient presents with   Hospitalization Follow-up    HPI  Ricardo Brown presents for hospital follow up  Past Medical History:  Diagnosis Date   Chronic combined systolic (congestive) and diastolic (congestive) heart failure ([place])    a. 01/2018 EF 35-40%; b. 10/2020 Echo: EF 40-45%, glob HK. RVSP 42.52mHg. Mildly dil LA. Mild MS/AoV sclerosis.   CKD (chronic kidney disease), stage Brown (HCC)    Coronary artery disease    a. 01/2018 late presenting inferior MI; b. 01/2018 Cath: Severe multivessel dzs-->CABG x 3 (LIMA->LAD, VG->OM2, VG->RPDA @ [facility]).   Diabetes mellitus without complication ([place]    a. Dx ~ 2000.   Heart palpitations    a. Pt reports prior nl echo's and stress tests. Last stress test w/in past 2 yrs - PCP.   High cholesterol    Hypertension    Mitral regurgitation    a. 01/2018 s/p MV repair @ time of CABG.   [facility]ocardial infarction ([facility]    Postoperative atrial fibrillation    a. 01/2018 @ time of CABG.   [place]VT (paroxys[place]ricular [place]cardia) [place]    a. Very symptomatic with multiple ED evaluations.  Has been on amio.   STEMI (ST elevation myocardial infarction) ([place] 01/24/2018    Past Surgical History:  Procedure Laterality Date   AMPUTATION TOE Right 08/29/2021   Procedure: AMPUTATION TOE-2nd toe;  Surgeon: CSharlotte Alamo DPM;  Location: [facility] ORS;  Service: Podiatry;  Laterality: Right;   CARDIAC CATHETERIZATION     CARDIAC VALVE REPLACEMENT     Mitral Valve Repair   COLONOSCOPY WITH PROPOFOL N/A 06/07/2019   Procedure: COLONOSCOPY WITH PROPOFOL;  Surgeon: TVirgel Manifold MD;  Location: [facility] ENDOSCOPY;  Service: Endoscopy;  Laterality: N/A;   COLONOSCOPY WITH PROPOFOL N/A 02/07/2020   Procedure: COLONOSCOPY WITH PROPOFOL;  Surgeon: TVirgel Manifold MD;  Location: [facility]  ENDOSCOPY;  Service: Endoscopy;  Laterality: N/A;   COLONOSCOPY WITH PROPOFOL N/A 12/11/2020   Procedure: COLONOSCOPY WITH PROPOFOL;  Surgeon: TVirgel Manifold MD;  Location: [facility] ENDOSCOPY;  Service: Endoscopy;  Laterality: N/A;   CORONARY ANGIOPLASTY     CORONARY ARTERY BYPASS GRAFT     x 3   01/2018   ESOPHAGOGASTRODUODENOSCOPY N/A 02/07/2020   Procedure: ESOPHAGOGASTRODUODENOSCOPY (EGD);  Surgeon: TVirgel Manifold MD;  [facility]  Service: Endoscopy;  Laterality: N/A;   LEFT HEART CATH AND CORONARY ANGIOGRAPHY N/A 01/24/2018   Procedure: LEFT HEART CATH AND CORONARY ANGIOGRAPHY;  Surgeon: AWellington Hampshire MD;  [place];  Service: Cardiovascular;  Laterality: N/A;   TONSILLECTOMY      Family History  Problem Relation Age of Onset   Cancer Mother        died @ 732  CAD Father        First MI @ 89 S/p heart transplant. Died in mid-50's of cancer.   Cancer Father    Other Brother        alive and well    Social History   Socioeconomic History   Marital status: Married    Spouse name: Not on file   Number of children: Not on file   Years of education: Not on file   Highest education level: Not on file  Occupational History   Not on file  Tobacco Use   Smoking status: Never   Smokeless tobacco: Never  Vaping Use   Vaping Use: Never used  Substance and Sexual Activity   Alcohol use: Not Currently    Comment: rare beer   Drug use: No   Sexual activity: Not on file  Other Topics Concern   Not on file  Social History Narrative   Lives locally with wife.  Works in [employer].  Does not routinely exercise.   Social Determinants of Health   Financial Resource Strain: Not on file  Food Insecurity: Not on file  Transportation Needs: Not on file  Physical Activity: Not on file  Stress: Not on file  Social Connections: Not on file  Intimate Partner Violence: Not on file     Current Outpatient Medications:    aspirin 81 MG  EC tablet, Take 81 mg by mouth daily. , Disp: , Rfl:    b complex vitamins tablet, Take 1 tablet by mouth daily. , Disp: , Rfl:    Dulaglutide 1.5 MG/0.5ML SOPN, Inject 1.5 mg into the skin every Saturday., Disp: , Rfl:    empagliflozin (JARDIANCE) 25 MG TABS tablet, Take 25 mg by mouth daily., Disp: , Rfl:    gabapentin (NEURONTIN) 100 MG capsule, Take 1 capsule (100 mg total) by mouth 2 (two) times daily., Disp: 60 capsule, Rfl: 1   insulin degludec (TRESIBA) 200 UNIT/ML FlexTouch Pen, 24 Units at bedtime., Disp: , Rfl:    losartan (COZAAR) 100 MG tablet, Take 1 tablet (100 mg total) by mouth daily., Disp: 90 tablet, Rfl: 0   metFORMIN (GLUCOPHAGE) 500 MG tablet, Take 2 tablets (1,000 mg total) by mouth 2 (two) times daily., Disp: 360 tablet, Rfl: 2   metolazone (ZAROXOLYN) 2.5 MG tablet, Take 1 tablet (2.5 mg) by mouth once a week as needed for weight gain greater than 3 lbs, Disp: , Rfl:    metoprolol succinate (TOPROL-XL) 50 MG 24 hr tablet, Take 1 tablet (50 mg total) by mouth daily. Take with or immediately following a meal., Disp: 90 tablet, Rfl: 3   Omega-3 Fatty Acids (FISH OIL) 875 MG CAPS, Take 875 mg by mouth 2 (two) times daily., Disp: , Rfl:    Pancrelipase, Lip-Prot-Amyl, (ZENPEP) 40000-126000 units CPEP, Take 80,000 Units by mouth 3 (three) times daily with meals AND 40,000 Units with snacks., Disp: 600 capsule, Rfl: 1   pramipexole (MIRAPEX) 1.5 MG tablet, Take 1 tablet (1.5 mg total) by mouth 2 (two) times daily. (Patient taking differently: Take 1.5 mg by mouth at bedtime.), Disp: 180 tablet, Rfl: 3   rosuvastatin (CRESTOR) 20 MG tablet, Take 1 tablet (20 mg total) by mouth daily., Disp: 90 tablet, Rfl: 0   spironolactone (ALDACTONE) 25 MG tablet, Take 1 tablet (25 mg total) by mouth daily., Disp: 90 tablet, Rfl: 1   torsemide (DEMADEX) 20 MG tablet, Take 2 tablets (40 mg total) by mouth daily. 40 mg daily, Disp: 180 tablet, Rfl: 3   Allergies  Allergen Reactions   Entresto  [Sacubitril-Valsartan] Other (See Comments)    SVT   Lipitor [Atorvastatin] Palpitations    SVT   Pregabalin Palpitations    SVT    ROS Review of Systems  Constitutional: Negative.   HENT: Negative.    Eyes: Negative.   Respiratory: Negative.    Cardiovascular: Negative.   Gastrointestinal: Negative.   Endocrine: Negative.   Genitourinary: Negative.   Musculoskeletal: Negative.   Skin: Negative.   Allergic/Immunologic: Negative.   Neurological: Negative.  Hematological: Negative.   Psychiatric/Behavioral: Negative.    All other systems reviewed and are negative.    Objective:    Physical Exam Musculoskeletal:       Feet:     Comments: Rt2nd toe amputated    BP 127/71    Pulse 89    Ht _0  (1.727 m)    Wt 203 lb 3.2 oz (92.2 kg)    BMI 30.90 kg/m  Wt Readings from Last 3 Encounters:  09/10/21 203 lb 3.2 oz (92.2 kg)  09/05/21 204 lb 12.8 oz (92.9 kg)  08/30/21 203 lb 11.3 oz (92.4 kg)     Health Maintenance Due  Topic Date Due   COVID-19 Vaccine (1) Never done   Hepatitis C Screening  Never done   Zoster Vaccines- Shingrix (1 of 2) Never done    There are no preventive care reminders to display for this patient.  Lab Results  Component Value Date   TSH 2.780 07/11/2021   Lab Results  Component Value Date   WBC 10.4 08/30/2021   HGB 11.6 (L) 08/30/2021   HCT 34.8 (L) 08/30/2021   MCV 89.0 08/30/2021   PLT 229 08/30/2021   Lab Results  Component Value Date   NA 134 (L) 08/30/2021   K 3.9 08/30/2021   CO2 25 08/30/2021   GLUCOSE 190 (H) 08/30/2021   BUN 44 (H) 08/30/2021   CREATININE 1.49 (H) 08/30/2021   BILITOT 0.6 08/29/2021   ALKPHOS 47 08/29/2021   AST 19 08/29/2021   ALT 19 08/29/2021   PROT 7.0 08/29/2021   ALBUMIN 3.4 (L) 08/29/2021   CALCIUM 8.9 08/30/2021   ANIONGAP 9 08/30/2021   EGFR 29 (L) 08/26/2021   Lab Results  Component Value Date   CHOL 122 09/30/2020   Lab Results  Component Value Date   HDL 35 (L) 09/30/2020    Lab Results  Component Value Date   LDLCALC 67 09/30/2020   Lab Results  Component Value Date   TRIG 121 09/30/2020   Lab Results  Component Value Date   CHOLHDL 3.5 09/30/2020   Lab Results  Component Value Date   HGBA1C 7.4 (H) 08/28/2021      Assessment & Plan:   Problem List Items Addressed This Visit       Cardiovascular and Mediastinum   Hypertension     Patient denies any chest pain or shortness of breath there is no history of palpitation or paroxysmal nocturnal dyspnea   patient was advised to follow low-salt low-cholesterol diet    ideally I want to keep systolic blood pressure below 130 mmHg, patient was asked to check blood pressure one times a week and give me a report on that.  Patient will be follow-up in 3 months  or earlier as needed, patient will call me back for any change in the cardiovascular symptoms Patient was advised to buy a book from local bookstore concerning blood pressure and read several chapters  every day.  This will be supplemented by some of the material we will give him from the office.  Patient should also utilize other resources like YouTube and Internet to learn more about the blood pressure and the diet.      PSVT (paroxysmal supraventricular tachycardia) ([place])    Patient is planned to have ablation      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    Second big toe on the right side amputated        Endocrine  Diabetes mellitus type 2, uncomplicated ([place]) - Primary    Diabetes under control      Relevant Orders   POCT glucose (manual entry) (Completed)    No orders of the defined types were placed in this encounter.   Follow-up: No follow-ups on file.    [name], MD

## 2021-09-10 NOTE — Assessment & Plan Note (Signed)

## 2021-09-10 NOTE — Assessment & Plan Note (Signed)
Diabetes under control °

## 2021-09-10 NOTE — Assessment & Plan Note (Signed)
Second big toe on the right side amputated

## 2021-09-12 ENCOUNTER — Ambulatory Visit (HOSPITAL_COMMUNITY): Payer: 59 | Admitting: Anesthesiology

## 2021-09-12 ENCOUNTER — Encounter (HOSPITAL_COMMUNITY)
Admission: RE | Disposition: A | Discharge status: Home / Self Care | Payer: 59 | Source: Home / Self Care | Attending: Cardiology

## 2021-09-12 ENCOUNTER — Ambulatory Visit (HOSPITAL_BASED_OUTPATIENT_CLINIC_OR_DEPARTMENT_OTHER): Payer: 59 | Admitting: Anesthesiology

## 2021-09-12 ENCOUNTER — Other Ambulatory Visit: Payer: Self-pay

## 2021-09-12 ENCOUNTER — Ambulatory Visit (HOSPITAL_COMMUNITY)
Admission: RE | Admit: 2021-09-12 | Discharge: 2021-09-12 | Disposition: A | Discharge status: Home / Self Care | Payer: 59 | Attending: Cardiology | Admitting: Cardiology

## 2021-09-12 ENCOUNTER — Encounter (HOSPITAL_COMMUNITY): Payer: Self-pay | Admitting: Cardiology

## 2021-09-12 DIAGNOSIS — Z7984 Long term (current) use of oral hypoglycemic drugs: Secondary | ICD-10-CM | POA: Diagnosis not present

## 2021-09-12 DIAGNOSIS — E119 Type 2 diabetes mellitus without complications: Secondary | ICD-10-CM | POA: Insufficient documentation

## 2021-09-12 DIAGNOSIS — I509 Heart failure, unspecified: Secondary | ICD-10-CM | POA: Diagnosis not present

## 2021-09-12 DIAGNOSIS — I2510 Atherosclerotic heart disease of native coronary artery without angina pectoris: Secondary | ICD-10-CM | POA: Diagnosis not present

## 2021-09-12 DIAGNOSIS — I110 Hypertensive heart disease with heart failure: Secondary | ICD-10-CM | POA: Diagnosis not present

## 2021-09-12 DIAGNOSIS — Z951 Presence of aortocoronary bypass graft: Secondary | ICD-10-CM | POA: Insufficient documentation

## 2021-09-12 DIAGNOSIS — I471 Supraventricular tachycardia: Secondary | ICD-10-CM | POA: Insufficient documentation

## 2021-09-12 DIAGNOSIS — I5022 Chronic systolic (congestive) heart failure: Secondary | ICD-10-CM | POA: Insufficient documentation

## 2021-09-12 DIAGNOSIS — E785 Hyperlipidemia, unspecified: Secondary | ICD-10-CM | POA: Diagnosis not present

## 2021-09-12 DIAGNOSIS — Z7985 Long-term (current) use of injectable non-insulin antidiabetic drugs: Secondary | ICD-10-CM | POA: Insufficient documentation

## 2021-09-12 DIAGNOSIS — I130 Hypertensive heart and chronic kidney disease with heart failure and stage 1 through stage 4 chronic kidney disease, or unspecified chronic kidney disease: Secondary | ICD-10-CM

## 2021-09-12 DIAGNOSIS — I252 Old myocardial infarction: Secondary | ICD-10-CM | POA: Insufficient documentation

## 2021-09-12 HISTORY — PX: SVT ABLATION: EP1225

## 2021-09-12 LAB — GLUCOSE, CAPILLARY
Glucose-Capillary: 116 mg/dL — ABNORMAL HIGH (ref 70–99)
Glucose-Capillary: 114 mg/dL — ABNORMAL HIGH (ref 70–99)

## 2021-09-12 SURGERY — SVT ABLATION
Anesthesia: General

## 2021-09-12 MED ORDER — METOPROLOL SUCCINATE ER 50 MG PO TB24
50.0000 mg | ORAL_TABLET | Freq: Once | ORAL | Status: AC
  Administered 2021-09-12: 50 mg via ORAL
  Filled 2021-09-12: qty 1

## 2021-09-12 MED ORDER — ACETAMINOPHEN 500 MG PO TABS
ORAL_TABLET | ORAL | Status: AC
  Filled 2021-09-12: qty 2

## 2021-09-12 MED ORDER — MIDAZOLAM HCL 2 MG/2ML IJ SOLN
INTRAMUSCULAR | Status: DC | PRN
  Administered 2021-09-12: 1 mg via INTRAVENOUS

## 2021-09-12 MED ORDER — BUPIVACAINE HCL (PF) 0.25 % IJ SOLN
INTRAMUSCULAR | Status: DC | PRN
  Administered 2021-09-12: 30 mL

## 2021-09-12 MED ORDER — SODIUM CHLORIDE 0.9 % IV SOLN: INTRAVENOUS | Status: DC

## 2021-09-12 MED ORDER — SODIUM CHLORIDE 0.9 % IV SOLN: 250.0000 mL | INTRAVENOUS | Status: DC | PRN

## 2021-09-12 MED ORDER — SODIUM CHLORIDE 0.9% FLUSH: 3.0000 mL | INTRAVENOUS | Status: DC | PRN

## 2021-09-12 MED ORDER — ACETAMINOPHEN 325 MG PO TABS
650.0000 mg | ORAL_TABLET | ORAL | Status: DC | PRN
  Administered 2021-09-12: 650 mg via ORAL
  Filled 2021-09-12 (×3): qty 2

## 2021-09-12 MED ORDER — PHENYLEPHRINE 40 MCG/ML (10ML) SYRINGE FOR IV PUSH (FOR BLOOD PRESSURE SUPPORT)
PREFILLED_SYRINGE | INTRAVENOUS | Status: DC | PRN
  Administered 2021-09-12 (×2): 80 ug via INTRAVENOUS
  Administered 2021-09-12: 120 ug via INTRAVENOUS
  Administered 2021-09-12 (×3): 80 ug via INTRAVENOUS
  Administered 2021-09-12: 120 ug via INTRAVENOUS

## 2021-09-12 MED ORDER — ONDANSETRON HCL 4 MG/2ML IJ SOLN: 4.0000 mg | Freq: Four times a day (QID) | INTRAMUSCULAR | Status: DC | PRN

## 2021-09-12 MED ORDER — HEPARIN (PORCINE) IN NACL 1000-0.9 UT/500ML-% IV SOLN
INTRAVENOUS | Status: DC | PRN
  Administered 2021-09-12: 500 mL

## 2021-09-12 MED ORDER — ISOPROTERENOL HCL 0.2 MG/ML IJ SOLN
INTRAVENOUS | Status: DC | PRN
  Administered 2021-09-12: 2 ug/min via INTRAVENOUS

## 2021-09-12 MED ORDER — PROPOFOL 500 MG/50ML IV EMUL
INTRAVENOUS | Status: DC | PRN
  Administered 2021-09-12: 50 ug/kg/min via INTRAVENOUS

## 2021-09-12 MED ORDER — ACETAMINOPHEN 500 MG PO TABS
1000.0000 mg | ORAL_TABLET | Freq: Once | ORAL | Status: AC
  Administered 2021-09-12: 1000 mg via ORAL
  Filled 2021-09-12: qty 2

## 2021-09-12 MED ORDER — SODIUM CHLORIDE 0.9% FLUSH: 3.0000 mL | Freq: Two times a day (BID) | INTRAVENOUS | Status: DC

## 2021-09-12 MED ORDER — FENTANYL CITRATE (PF) 100 MCG/2ML IJ SOLN
INTRAMUSCULAR | Status: DC | PRN
  Administered 2021-09-12 (×2): 25 ug via INTRAVENOUS

## 2021-09-12 MED ORDER — BUPIVACAINE HCL (PF) 0.25 % IJ SOLN
INTRAMUSCULAR | Status: AC
  Filled 2021-09-12: qty 30

## 2021-09-12 MED ORDER — PROPOFOL 10 MG/ML IV BOLUS
INTRAVENOUS | Status: DC | PRN
Start: 2021-09-12 — End: 2021-09-12
  Administered 2021-09-12 (×3): 20 mg via INTRAVENOUS

## 2021-09-12 MED ORDER — HEPARIN SODIUM (PORCINE) 1000 UNIT/ML IJ SOLN
INTRAMUSCULAR | Status: DC | PRN
  Administered 2021-09-12: 1000 [IU] via INTRAVENOUS

## 2021-09-12 MED ORDER — AMIODARONE HCL 200 MG PO TABS: ORAL_TABLET | ORAL | 0 refills | Status: DC

## 2021-09-12 MED ORDER — ISOPROTERENOL HCL 0.2 MG/ML IJ SOLN
INTRAMUSCULAR | Status: AC
  Filled 2021-09-12: qty 5

## 2021-09-12 MED ORDER — PHENYLEPHRINE HCL-NACL 20-0.9 MG/250ML-% IV SOLN
INTRAVENOUS | Status: DC | PRN
  Administered 2021-09-12: 50 ug/min via INTRAVENOUS

## 2021-09-12 MED ORDER — METOPROLOL SUCCINATE ER 50 MG PO TB24
50.0000 mg | ORAL_TABLET | Freq: Every day | ORAL | Status: DC
  Filled 2021-09-12: qty 1

## 2021-09-12 SURGICAL SUPPLY — 14 items
CATH CRD2 QUAD 6FR REP (CATHETERS) ×1 IMPLANT
CATH JOSEPH QUAD ALLRED 6F REP (CATHETERS) ×1 IMPLANT
CATH SMTCH THERMOCOOL SF DF (CATHETERS) ×1 IMPLANT
CATH WEB BI DIR CSDF CRV REPRO (CATHETERS) ×1 IMPLANT
CLOSURE PERCLOSE PROSTYLE (VASCULAR PRODUCTS) ×3 IMPLANT
MAT PREVALON FULL STRYKER (MISCELLANEOUS) ×1 IMPLANT
PACK EP LATEX FREE (CUSTOM PROCEDURE TRAY) ×1
PACK EP LF (CUSTOM PROCEDURE TRAY) ×1 IMPLANT
PAD DEFIB RADIO PHYSIO CONN (PAD) ×2 IMPLANT
PATCH CARTO3 (PAD) ×1 IMPLANT
SHEATH PINNACLE 7F 10CM (SHEATH) ×1 IMPLANT
SHEATH PINNACLE 8F 10CM (SHEATH) ×2 IMPLANT
SHEATH PROBE COVER 6X72 (BAG) ×1 IMPLANT
TUBING SMART ABLATE COOLFLOW (TUBING) ×1 IMPLANT

## 2021-09-12 NOTE — Anesthesia Procedure Notes (Signed)
Procedure Name: MAC Date/Time: 09/12/2021 11:20 AM Performed by: Imagene Riches, CRNA Pre-anesthesia Checklist: Patient identified, Emergency Drugs available, Suction available, Patient being monitored and Timeout performed Oxygen Delivery Method: Nasal cannula

## 2021-09-12 NOTE — Transfer of Care (Signed)
Immediate Anesthesia Transfer of Care Note  Patient: Ricardo Brown  Procedure(s) Performed: SVT ABLATION  Patient Location: PACU  Anesthesia Type:MAC  Level of Consciousness: awake, alert  and oriented  Airway & Oxygen Therapy: Patient Spontanous Breathing and Patient connected to nasal cannula oxygen  Post-op Assessment: Report given to RN and Post -op Vital signs reviewed and stable  Post vital signs: Reviewed and stable  Last Vitals:  Vitals Value Taken Time  BP 117/63 09/12/21 1230  Temp    Pulse 75 09/12/21 1231  Resp 10 09/12/21 1231  SpO2 99 % 09/12/21 1231  Vitals shown include unvalidated device data.  Last Pain:  Vitals:   09/12/21 0919  TempSrc:   PainSc: 0-No pain         Complications:  Encounter Notable Events  Notable Event Outcome Phase Comment  None  Intraprocedure

## 2021-09-12 NOTE — Anesthesia Preprocedure Evaluation (Addendum)
Anesthesia Evaluation  Patient identified by MRN, date of birth, ID band Patient awake    Reviewed: Allergy & Precautions, NPO status , Patient's Chart, lab work & pertinent test results, reviewed documented beta blocker date and time   History of Anesthesia Complications Negative for: history of anesthetic complications  Airway Mallampati: III  TM Distance: >3 FB Neck ROM: Full    Dental  (+) Upper Dentures, Lower Dentures   Pulmonary neg pulmonary ROS,    Pulmonary exam normal        Cardiovascular hypertension, Pt. on medications and Pt. on home beta blockers + CAD, + Past MI (2019), + CABG (2019 x3), +CHF and + DOE  Normal cardiovascular exam+ dysrhythmias (RBBB) Atrial Fibrillation   01/2018 EF 35-40%; b. 10/2020 Echo: EF 40-45%, glob HK. RVSP 42.4mHg. Mildly dil LA. Mild MS/AoV sclerosis.  ECHO 3/22: 1. Left ventricular ejection fraction, by estimation, is 40 to 45%. The  left ventricle has mildly decreased function. The left ventricle  demonstrates global hypokinesis. The left ventricular internal cavity size  was mildly dilated. There is mild left  ventricular hypertrophy. Left ventricular diastolic parameters are  indeterminate. The average left ventricular global longitudinal strain is  -12.7 %. The global longitudinal strain is abnormal.  2. Right ventricular systolic function is normal. The right ventricular  size is normal. There is mildly elevated pulmonary artery systolic  pressure. The estimated right ventricular systolic pressure is 473.2mmHg.  3. Left atrial size was mildly dilated.  4. The mitral valve has been repaired/replaced. Trivial mitral valve  regurgitation. Mild mitral stenosis. The mean mitral valve gradient is 7.0  mmHg. Procedure Date: 2019.  5. The aortic valve is normal in structure. Aortic valve regurgitation is  not visualized. Mild aortic valve sclerosis is present, with no evidence  of  aortic valve stenosis.  6. The inferior vena cava is normal in size with greater than 50%  respiratory variability, suggesting right atrial pressure of 3 mmHg.    Neuro/Psych negative neurological ROS  negative psych ROS   GI/Hepatic negative GI ROS, Neg liver ROS,   Endo/Other  diabetes, Well Controlled, Type 2, Oral Hypoglycemic Agents  Renal/GU CRFRenal disease  negative genitourinary   Musculoskeletal negative musculoskeletal ROS (+)   Abdominal Normal abdominal exam  (+)   Peds negative pediatric ROS (+)  Hematology negative hematology ROS (+) Blood dyscrasia, anemia ,   Anesthesia Other Findings 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  CAD, CABG Mitral Valve Repair 2019 Diabetes [place] complication [place]     Comment:  a. Dx ~ 2000. Heart palpitations High cholesterol  EF 35-40%  Reproductive/Obstetrics                             Anesthesia Physical  Anesthesia Plan  ASA: 3  Anesthesia Plan: MAC   Post-op Pain Management: Tylenol PO (pre-op) and Minimal or no pain anticipated   Induction: Intravenous  PONV Risk Score and Plan: Ondansetron, Midazolam and Propofol infusion  Airway Management Planned: Natural Airway, Simple Face Mask and Nasal Cannula  Additional Equipment:   Intra-op Plan:   Post-operative Plan:   Informed Consent: I have reviewed the patients History and Physical, chart, labs and discussed the procedure including the risks, benefits and alternatives for the proposed anesthesia with the patient or authorized representative who has indicated his/her understanding and acceptance.     Dental advisory given  Plan Discussed with: CRNA, Surgeon and Anesthesiologist  Anesthesia Plan Comments:        Anesthesia Quick Evaluation

## 2021-09-12 NOTE — Discharge Instructions (Signed)
Post procedure care instructions No driving for 4 days. No lifting over 5 lbs for 1 week. No vigorous or sexual activity for 1 week. You may return to work/your usual activities on 09/20/21. Keep procedure site clean & dry. If you notice increased pain, swelling, bleeding or pus, call/return!  You may shower after 24 hours, but no soaking in baths/hot tubs/pools for 1 week.

## 2021-09-12 NOTE — Progress Notes (Signed)
Patients HR is sustained at around 120. MD notifed. Quentin Ore, MD gave verbal order to give his home dose of metoprolol. Orders placed and followed. Will continue to monitor.

## 2021-09-12 NOTE — Interval H&P Note (Signed)
History and Physical Interval Note:  09/12/2021 10:37 AM  Ricardo Brown  has presented today for surgery, with the diagnosis of svt.  The various methods of treatment have been discussed with the patient and family. After consideration of risks, benefits and other options for treatment, the patient has consented to  Procedure(s): SVT ABLATION (N/A) as a surgical intervention.  The patient's history has been reviewed, patient examined, no change in status, stable for surgery.  I have reviewed the patient's chart and labs.  Questions were answered to the patient's satisfaction.     Adem Costlow T Kadasia Kassing

## 2021-09-12 NOTE — Anesthesia Postprocedure Evaluation (Signed)
Anesthesia Post Note  Patient: [name]  Procedure(s) Performed: SVT ABLATION     Patient location during evaluation: PACU Anesthesia Type: General Level of consciousness: sedated Pain management: pain level controlled Vital Signs Assessment: post-procedure vital signs reviewed and stable Respiratory status: spontaneous breathing and respiratory function stable Cardiovascular status: stable Postop Assessment: no apparent nausea or vomiting Anesthetic complications: no   Encounter Notable Events  Notable Event Outcome Phase Comment  None  Intraprocedure     Last Vitals:  Vitals:   09/12/21 1317 09/12/21 1325  BP: 134/68 (!) 152/75  Pulse: 68 75  Resp:  14  Temp:    SpO2: 100% 100%    Last Pain:  Vitals:   09/12/21 1315  TempSrc:   PainSc: 0-No pain                 Oluwanifemi Susman DANIEL

## 2021-09-15 ENCOUNTER — Encounter (HOSPITAL_COMMUNITY): Payer: Self-pay | Admitting: Cardiology

## 2021-09-19 ENCOUNTER — Other Ambulatory Visit: Payer: Self-pay

## 2021-09-19 ENCOUNTER — Ambulatory Visit (INDEPENDENT_AMBULATORY_CARE_PROVIDER_SITE_OTHER): Payer: 59 | Admitting: Cardiology

## 2021-09-19 ENCOUNTER — Encounter: Payer: Self-pay | Admitting: Cardiology

## 2021-09-19 VITALS — BP 110/60 | HR 69 | Ht 68.0 in | Wt 205.0 lb

## 2021-09-19 DIAGNOSIS — Z951 Presence of aortocoronary bypass graft: Secondary | ICD-10-CM

## 2021-09-19 DIAGNOSIS — I5020 Unspecified systolic (congestive) heart failure: Secondary | ICD-10-CM | POA: Diagnosis not present

## 2021-09-19 DIAGNOSIS — I10 Essential (primary) hypertension: Secondary | ICD-10-CM | POA: Diagnosis not present

## 2021-09-19 DIAGNOSIS — I471 Supraventricular tachycardia: Secondary | ICD-10-CM | POA: Diagnosis not present

## 2021-09-19 MED ORDER — LOSARTAN POTASSIUM 50 MG PO TABS: 50.0000 mg | ORAL_TABLET | Freq: Every day | ORAL | 1 refills | Status: DC

## 2021-09-19 NOTE — Patient Instructions (Signed)
Medication Instructions:   Your physician has recommended you make the following change in your medication:   DECREASE your Losartan to 50 MG once a day.  *If you need a refill on your cardiac medications before your next appointment, please call your pharmacy*   Lab Work: None ordered If you have labs (blood work) drawn today and your tests are completely normal, you will receive your results only by: [place] (if you have MyChart) OR A paper copy in the mail If you have any lab test that is abnormal or we need to change your treatment, we will call you to review the results.   Testing/Procedures:  Your physician has requested that you have an echocardiogram in 3 months. Echocardiography is a painless test that uses sound waves to create images of your heart. It provides your doctor with information about the size and shape of your heart and how well your hearts chambers and valves are working. This procedure takes approximately one hour. There are no restrictions for this procedure.    Follow-Up: At [facility], you and your health needs are our priority.  As part of our continuing mission to provide you with exceptional heart care, we have created designated Provider Care Teams.  These Care Teams include your primary Cardiologist (physician) and Advanced Practice Providers (APPs -  Physician Assistants and Nurse Practitioners) who all work together to provide you with the care you need, when you need it.  We recommend signing up for the patient portal called "MyChart".  Sign up information is provided on this After Visit Summary.  MyChart is used to connect with patients for Virtual Visits (Telemedicine).  Patients are able to view lab/test results, encounter notes, upcoming appointments, etc.  Non-urgent messages can be sent to your provider as well.   To learn more about what you can do with MyChart, go to [email or web address].    Your next appointment:   3  month(s)  The format for your next appointment:   In Person  Provider:   You may see [name], MD or one of the following Advanced Practice Providers on your designated Care Team:   Murray Hodgkins, NP Christell Faith, PA-C Cadence Kathlen Mody, [place]    Other Instructions

## 2021-09-19 NOTE — Progress Notes (Signed)
Cardiology Office Note:    Date:  09/19/2021   ID:  Ricardo Brown, DOB [birth year], MRN [id number]  PCP:  [name], MD  Cardiologist:  [name], MD  Electrophysiologist:  None   Referring MD: [name], MD   Chief Complaint  Patient presents with   Other    1 month follow up. Meds reviewed verbally with patient.      History of Present Illness:    Ricardo Brown is a 61 y.o. male with a hx of SVT on amio , CAD/CABG x3 in 2019, HFrEF EF 40-45%, hypertension, hyperlipidemia, diabetes, CKD who presents for follow-up.   Being seen for cardiomyopathy, CAD and SVT.  Patient was evaluated by EP, RFA was being planned to manage SVT.  After EP study, RFA aborted since this might have caused heart block requiring PPM.  Amiodarone restarted, patient states feeling fine with no palpitations, dizziness since restarting amiodarone.  Has an appointment with nephrology next week for CKD.  States feeling tired, blood pressures at home have been low occasionally systolics in the 62B.  Prior notes Echo 10/2020 EF 40 to 76%, grade 2 diastolic dysfunction. Patient was seen in the [place] in 06/ 2019 due to nausea, weakness, myalgias.  EKG at the time showed inferior ST elevation with reciprocal changes in the anterior leads and small inferior Q waves.  Patient was deemed late presenting STEMI.   Left heart cath was performed, briefly showed occluded distal RCA, 90% mid LAD, 90% proximal left circumflex.  Due to patient being a diabetic, and left heart cath showing diffuse three-vessel disease, CABG was recommended.  He underwent coronary artery bypass grafting x3 in July 2019 at [facility].  Delene Loll previously tried on patient, he did not tolerate.  He developed feeling of unwell, went into SVT and was taken to the emergency room.  Patient with history of SVT, who was seen in the emergency room and adenosine given x2 and placed on amiodarone  Echo on  09/2019 showed mild to moderately reduced ejection fraction, EF 40 to 45%.  Past Medical History:  Diagnosis Date   Chronic combined systolic (congestive) and diastolic (congestive) heart failure ([place])    a. 01/2018 EF 35-40%; b. 10/2020 Echo: EF 40-45%, glob HK. RVSP 42.19mmHg. Mildly dil LA. Mild MS/AoV sclerosis.   CKD (chronic kidney disease), stage Brown (HCC)    Coronary artery disease    a. 01/2018 late presenting inferior MI; b. 01/2018 Cath: Severe multivessel dzs-->CABG x 3 (LIMA->LAD, VG->OM2, VG->RPDA @ [facility]).   Diabetes mellitus without complication ([place])    a. Dx ~ 2000.   Heart palpitations    a. Pt reports prior nl echo's and stress tests. Last stress test w/in past 2 yrs - PCP.   High cholesterol    Hypertension    Mitral regurgitation    a. 01/2018 s/p MV repair @ time of CABG.   Myocardial infarction [facility])    Postoperative atrial fibrillation    a. 01/2018 @ time of CABG.   PSVT (paroxysmal supraventricular tachycardia) ([place])    a. Very symptomatic with multiple ED evaluations.  Has been on amio.   STEMI (ST elevation myocardial infarction) ([place]) 01/24/2018    Past Surgical History:  Procedure Laterality Date   AMPUTATION TOE Right 08/29/2021   Procedure: AMPUTATION TOE-2nd toe;  Surgeon: Sharlotte Alamo, DPM;  Location: [facility] ORS;  Service: Podiatry;  Laterality: Right;   CARDIAC CATHETERIZATION     CARDIAC VALVE REPLACEMENT  Mitral Valve Repair   COLONOSCOPY WITH PROPOFOL N/A 06/07/2019   Procedure: COLONOSCOPY WITH PROPOFOL;  Surgeon: [name], MD;  Location: [facility] ENDOSCOPY;  Service: Endoscopy;  Laterality: N/A;   COLONOSCOPY WITH PROPOFOL N/A 02/07/2020   Procedure: COLONOSCOPY WITH PROPOFOL;  Surgeon: [name], MD;  Location: [facility] ENDOSCOPY;  Service: Endoscopy;  Laterality: N/A;   COLONOSCOPY WITH PROPOFOL N/A 12/11/2020   Procedure: COLONOSCOPY WITH PROPOFOL;  Surgeon: [name], MD;  Location: [facility] ENDOSCOPY;  Service: Endoscopy;   Laterality: N/A;   CORONARY ANGIOPLASTY     CORONARY ARTERY BYPASS GRAFT     x 3   01/2018   ESOPHAGOGASTRODUODENOSCOPY N/A 02/07/2020   Procedure: ESOPHAGOGASTRODUODENOSCOPY (EGD);  Surgeon: [name], MD;  Location: [facility] ENDOSCOPY;  Service: Endoscopy;  Laterality: N/A;   LEFT HEART CATH AND CORONARY ANGIOGRAPHY N/A 01/24/2018   Procedure: LEFT HEART CATH AND CORONARY ANGIOGRAPHY;  Surgeon: [name], MD;  Location: [place] CV LAB;  Service: Cardiovascular;  Laterality: N/A;   SVT ABLATION N/A 09/12/2021   Procedure: SVT ABLATION;  Surgeon: Vickie Epley, MD;  Location: [place] CV LAB;  Service: Cardiovascular;  Laterality: N/A;   TONSILLECTOMY      Current Medications: Current Meds  Medication Sig   amiodarone (PACERONE) 200 MG tablet Take 2 tablets (400 mg total) by mouth 2 (two) times daily for 5 days, THEN 2 tablets (400 mg total) daily for 5 days, THEN 1 tablet (200 mg total) daily.   aspirin 81 MG EC tablet Take 81 mg by mouth daily.    b complex vitamins tablet Take 1 tablet by mouth daily.    Dulaglutide 1.5 MG/0.5ML SOPN Inject 1.5 mg into the skin every Saturday.   empagliflozin (JARDIANCE) 25 MG TABS tablet Take 25 mg by mouth daily.   gabapentin (NEURONTIN) 100 MG capsule Take 1 capsule (100 mg total) by mouth 2 (two) times daily.   insulin degludec (TRESIBA) 200 UNIT/ML FlexTouch Pen 24 Units at bedtime.   metFORMIN (GLUCOPHAGE) 500 MG tablet Take 2 tablets (1,000 mg total) by mouth 2 (two) times daily.   metolazone (ZAROXOLYN) 2.5 MG tablet Take 1 tablet (2.5 mg) by mouth once a week as needed for weight gain greater than 3 lbs   metoprolol succinate (TOPROL-XL) 50 MG 24 hr tablet Take 1 tablet (50 mg total) by mouth daily. Take with or immediately following a meal.   Omega-3 Fatty Acids (FISH OIL) 875 MG CAPS Take 875 mg by mouth 2 (two) times daily.   Pancrelipase, Lip-Prot-Amyl, (ZENPEP) 40000-126000 units CPEP Take 80,000 Units by mouth 3  (three) times daily with meals AND 40,000 Units with snacks.   pramipexole (MIRAPEX) 1.5 MG tablet Take 1 tablet (1.5 mg total) by mouth 2 (two) times daily.   rosuvastatin (CRESTOR) 20 MG tablet Take 1 tablet (20 mg total) by mouth daily.   spironolactone (ALDACTONE) 25 MG tablet Take 1 tablet (25 mg total) by mouth daily.   torsemide (DEMADEX) 20 MG tablet Take 2 tablets (40 mg total) by mouth daily. 40 mg daily   [DISCONTINUED] losartan (COZAAR) 100 MG tablet Take 1 tablet (100 mg total) by mouth daily.     Allergies:   Entresto [sacubitril-valsartan], Lipitor [atorvastatin], and Pregabalin   Social History   Socioeconomic History   Marital status: Married    Spouse name: Not on file   Number of children: Not on file   Years of education: Not on file   Highest  education level: Not on file  Occupational History   Not on file  Tobacco Use   Smoking status: Never   Smokeless tobacco: Never  Vaping Use   Vaping Use: Never used  Substance and Sexual Activity   Alcohol use: Not Currently    Comment: rare beer   Drug use: No   Sexual activity: Not on file  Other Topics Concern   Not on file  Social History Narrative   Lives locally with wife.  Works in [employer].  Does not routinely exercise.   Social Determinants of Health   Financial Resource Strain: Not on file  Food Insecurity: Not on file  Transportation Needs: Not on file  Physical Activity: Not on file  Stress: Not on file  Social Connections: Not on file     Family History: The patient's family history includes CAD in his father; Cancer in his father and mother; Other in his brother.  ROS:   Please see the history of present illness.     All other systems reviewed and are negative.  EKGs/Labs/Other Studies Reviewed:    The following studies were reviewed today:   EKG:  EKG is ordered today.  EKG shows normal sinus rhythm, right bundle branch block.  Recent Labs: 07/11/2021: TSH 2.780 08/29/2021: ALT  19 08/30/2021: BUN 44; Creatinine, Ser 1.49; Hemoglobin 11.6; Magnesium 2.2; Platelets 229; Potassium 3.9; Sodium 134  Recent Lipid Panel    Component Value Date/Time   CHOL 122 09/30/2020 1114   TRIG 121 09/30/2020 1114   HDL 35 (L) 09/30/2020 1114   CHOLHDL 3.5 09/30/2020 1114   VLDL 24 01/24/2018 1459   LDLCALC 67 09/30/2020 1114    Physical Exam:    VS:  BP 110/60 (BP Location: Left Arm, Patient Position: Sitting, Cuff Size: Normal)    Pulse 69    Ht 5\' 8"  (1.727 m)    Wt 205 lb (93 kg)    SpO2 95%    BMI 31.17 kg/m     Wt Readings from Last 3 Encounters:  09/19/21 205 lb (93 kg)  09/12/21 204 lb (92.5 kg)  09/10/21 203 lb 3.2 oz (92.2 kg)     GEN:  Well nourished, well developed in no acute distress HEENT: Normal NECK: No JVD; No carotid bruits CARDIAC: RRR, no murmurs, rubs, gallops RESPIRATORY:  Clear to auscultation without rales, wheezing or rhonchi  ABDOMEN: Soft, non-tender, non-distended MUSCULOSKELETAL:  1+ edema; No deformity  SKIN: Warm and dry NEUROLOGIC:  Alert and oriented x 3 PSYCHIATRIC:  Normal affect   ASSESSMENT:    1. Hx of CABG   2. HFrEF (heart failure with reduced ejection fraction) ([place])   3. Primary hypertension   4. SVT (supraventricular tachycardia) (HCC)     PLAN:    In order of problems listed above:  CAD status post CABG x3 in 2019. Denies chest pain.  Continue aspirin and Crestor. ICM, echo 10/2020 EF 40 to 45%.  Describes NYHA class II-Brown symptoms.  Continue metolazone 2.5 mg daily, continue torsemide 40 mg daily.  Consider increasing torsemide if okay with nephrology.  Continue Aldactone 25 mg daily, losartan 50 mg daily, Toprol-XL 50 mg daily.  Did not tolerate Entresto in the past, went into SVT. Hypertension, BP controlled, lower at home.  Reduce losartan to 50 mg daily.  Continue Toprol-XL, Aldactone 25 mg daily, torsemide.  Repeat echo in 3 months, follow-up after. SVT, currently in sinus rhythm on amiodarone.  Continue Amio  twice daily, convert to  200 mg daily as scheduled in a few days.  Follow-up in 3 months  Total encounter time 40 minutes  Greater than 50% was spent in counseling and coordination of care with the patient   Medication Adjustments/Labs and Tests Ordered: Current medicines are reviewed at length with the patient today.  Concerns regarding medicines are outlined above.  Orders Placed This Encounter  Procedures   EKG 12-Lead   ECHOCARDIOGRAM COMPLETE     Meds ordered this encounter  Medications   losartan (COZAAR) 50 MG tablet    Sig: Take 1 tablet (50 mg total) by mouth daily.    Dispense:  90 tablet    Refill:  1      Patient Instructions  Medication Instructions:   Your physician has recommended you make the following change in your medication:   DECREASE your Losartan to 50 MG once a day.  *If you need a refill on your cardiac medications before your next appointment, please call your pharmacy*   Lab Work: None ordered If you have labs (blood work) drawn today and your tests are completely normal, you will receive your results only by: [place] (if you have MyChart) OR A paper copy in the mail If you have any lab test that is abnormal or we need to change your treatment, we will call you to review the results.   Testing/Procedures:  Your physician has requested that you have an echocardiogram in 3 months. Echocardiography is a painless test that uses sound waves to create images of your heart. It provides your doctor with information about the size and shape of your heart and how well your hearts chambers and valves are working. This procedure takes approximately one hour. There are no restrictions for this procedure.    Follow-Up: At [facility], you and your health needs are our priority.  As part of our continuing mission to provide you with exceptional heart care, we have created designated Provider Care Teams.  These Care Teams include your primary  Cardiologist (physician) and Advanced Practice Providers (APPs -  Physician Assistants and Nurse Practitioners) who all work together to provide you with the care you need, when you need it.  We recommend signing up for the patient portal called "MyChart".  Sign up information is provided on this After Visit Summary.  MyChart is used to connect with patients for Virtual Visits (Telemedicine).  Patients are able to view lab/test results, encounter notes, upcoming appointments, etc.  Non-urgent messages can be sent to your provider as well.   To learn more about what you can do with MyChart, go to [email or web address].    Your next appointment:   3 month(s)  The format for your next appointment:   In Person  Provider:   You may see [name], MD or one of the following Advanced Practice Providers on your designated Care Team:   Murray Hodgkins, NP Christell Faith, PA-C Cadence Kathlen Mody, [place]    Other Instructions     Signed, [name], MD  09/19/2021 10:37 AM    [place]

## 2021-09-24 ENCOUNTER — Other Ambulatory Visit: Payer: Self-pay

## 2021-09-24 MED ORDER — METFORMIN HCL 500 MG PO TABS: 1000.0000 mg | ORAL_TABLET | Freq: Two times a day (BID) | ORAL | 2 refills | Status: DC

## 2021-09-25 ENCOUNTER — Other Ambulatory Visit: Payer: Self-pay | Admitting: Nephrology

## 2021-09-25 DIAGNOSIS — N183 Chronic kidney disease, stage 3 unspecified: Secondary | ICD-10-CM

## 2021-09-25 DIAGNOSIS — E1122 Type 2 diabetes mellitus with diabetic chronic kidney disease: Secondary | ICD-10-CM

## 2021-09-25 DIAGNOSIS — I509 Heart failure, unspecified: Secondary | ICD-10-CM | POA: Insufficient documentation

## 2021-09-25 DIAGNOSIS — R609 Edema, unspecified: Secondary | ICD-10-CM | POA: Insufficient documentation

## 2021-09-25 DIAGNOSIS — Z9889 Other specified postprocedural states: Secondary | ICD-10-CM | POA: Insufficient documentation

## 2021-09-25 DIAGNOSIS — Z89422 Acquired absence of other left toe(s): Secondary | ICD-10-CM | POA: Insufficient documentation

## 2021-09-25 DIAGNOSIS — N1832 Chronic kidney disease, stage 3b: Secondary | ICD-10-CM

## 2021-09-25 DIAGNOSIS — M869 Osteomyelitis, unspecified: Secondary | ICD-10-CM | POA: Insufficient documentation

## 2021-09-28 ENCOUNTER — Other Ambulatory Visit: Payer: Self-pay | Admitting: Internal Medicine

## 2021-10-03 ENCOUNTER — Other Ambulatory Visit: Payer: Self-pay

## 2021-10-03 ENCOUNTER — Ambulatory Visit
Admission: RE | Admit: 2021-10-03 | Discharge: 2021-10-03 | Disposition: A | Discharge status: Home / Self Care | Payer: 59 | Source: Ambulatory Visit | Attending: Nephrology | Admitting: Nephrology

## 2021-10-03 DIAGNOSIS — N1832 Chronic kidney disease, stage 3b: Secondary | ICD-10-CM | POA: Insufficient documentation

## 2021-10-03 DIAGNOSIS — E1122 Type 2 diabetes mellitus with diabetic chronic kidney disease: Secondary | ICD-10-CM | POA: Insufficient documentation

## 2021-10-03 DIAGNOSIS — N183 Chronic kidney disease, stage 3 unspecified: Secondary | ICD-10-CM | POA: Insufficient documentation

## 2021-10-08 ENCOUNTER — Encounter: Payer: Self-pay | Admitting: Cardiology

## 2021-10-08 ENCOUNTER — Encounter: Payer: Self-pay | Admitting: Internal Medicine

## 2021-10-08 ENCOUNTER — Ambulatory Visit (INDEPENDENT_AMBULATORY_CARE_PROVIDER_SITE_OTHER): Payer: 59 | Admitting: Internal Medicine

## 2021-10-08 ENCOUNTER — Ambulatory Visit (INDEPENDENT_AMBULATORY_CARE_PROVIDER_SITE_OTHER): Payer: 59 | Admitting: Cardiology

## 2021-10-08 ENCOUNTER — Other Ambulatory Visit: Payer: Self-pay

## 2021-10-08 VITALS — BP 140/72 | HR 68 | Ht 68.0 in | Wt 219.1 lb

## 2021-10-08 VITALS — BP 122/62 | HR 66 | Ht 68.0 in | Wt 216.0 lb

## 2021-10-08 DIAGNOSIS — Z794 Long term (current) use of insulin: Secondary | ICD-10-CM | POA: Diagnosis not present

## 2021-10-08 DIAGNOSIS — I10 Essential (primary) hypertension: Secondary | ICD-10-CM | POA: Diagnosis not present

## 2021-10-08 DIAGNOSIS — I25810 Atherosclerosis of coronary artery bypass graft(s) without angina pectoris: Secondary | ICD-10-CM | POA: Diagnosis not present

## 2021-10-08 DIAGNOSIS — Z79899 Other long term (current) drug therapy: Secondary | ICD-10-CM

## 2021-10-08 DIAGNOSIS — I471 Supraventricular tachycardia: Secondary | ICD-10-CM | POA: Diagnosis not present

## 2021-10-08 DIAGNOSIS — E119 Type 2 diabetes mellitus without complications: Secondary | ICD-10-CM | POA: Diagnosis not present

## 2021-10-08 DIAGNOSIS — I4891 Unspecified atrial fibrillation: Secondary | ICD-10-CM

## 2021-10-08 DIAGNOSIS — I9789 Other postprocedural complications and disorders of the circulatory system, not elsewhere classified: Secondary | ICD-10-CM | POA: Diagnosis not present

## 2021-10-08 DIAGNOSIS — I739 Peripheral vascular disease, unspecified: Secondary | ICD-10-CM

## 2021-10-08 DIAGNOSIS — I5042 Chronic combined systolic (congestive) and diastolic (congestive) heart failure: Secondary | ICD-10-CM

## 2021-10-08 LAB — GLUCOSE, POCT (MANUAL RESULT ENTRY): POC Glucose: 175 mg/dl — AB (ref 70–99)

## 2021-10-08 NOTE — Progress Notes (Signed)
Electrophysiology Office Follow up Visit Note:    Date:  10/08/2021   ID:  Ricardo Brown, DOB 09/26/1960, MRN [id number]  PCP:  [name], MD  [facility] Cardiologist:  [name], MD  [facility] Electrophysiologist:  Vickie Epley, MD    Interval History:    Ricardo Brown is a 61 y.o. male who presents for a follow up visit.  He underwent an EP study on September 12, 2021.  During the EP study and atrial tachycardia was induced with atrial stimulation with the earliest activation near the His bundle.  He is on amiodarone now and tolerating it without any off target effects.    Past Medical History:  Diagnosis Date   Chronic combined systolic (congestive) and diastolic (congestive) heart failure ([place])    a. 01/2018 EF 35-40%; b. 10/2020 Echo: EF 40-45%, glob HK. RVSP 42.70mHg. Mildly dil LA. Mild MS/AoV sclerosis.   CKD (chronic kidney disease), stage Brown (HCC)    Coronary artery disease    a. 01/2018 late presenting inferior MI; b. 01/2018 Cath: Severe multivessel dzs-->CABG x 3 (LIMA->LAD, VG->OM2, VG->RPDA @ [facility]).   Diabetes mellitus without complication ([place]    a. Dx ~ 2000.   Heart palpitations    a. Pt reports prior nl echo's and stress tests. Last stress test w/in past 2 yrs - PCP.   High cholesterol    Hypertension    Mitral regurgitation    a. 01/2018 s/p MV repair @ time of CABG.   Myocardial infarction ([facility]    Postoperative atrial fibrillation    a. 01/2018 @ time of CABG.   PSVT (paroxysmal supraventricular tachycardia) ([place]    a. Very symptomatic with multiple ED evaluations.  Has been on amio.   STEMI ([place] elevation [place] infarction) [place] 01/[place]    Past Surgical History:  Procedure Laterality Date   AMPUTATION TOE Right 08/29/2021   Procedure: AMPUTATION TOE-2nd toe;  Surgeon: CSharlotte Alamo DPM;  Location: [facility] ORS;  Service: Podiatry;  Laterality: Right;   CARDIAC CATHETERIZATION     CARDIAC VALVE REPLACEMENT      Mitral Valve Repair   COLONOSCOPY WITH PROPOFOL N/A 06/07/2019   Procedure: COLONOSCOPY WITH PROPOFOL;  Surgeon: TVirgel Manifold MD;  Location: [facility] ENDOSCOPY;  Service: Endoscopy;  Laterality: N/A;   COLONOSCOPY WITH PROPOFOL N/A 02/07/2020   Procedure: COLONOSCOPY WITH PROPOFOL;  Surgeon: TVirgel Manifold MD;  Location: [facility] ENDOSCOPY;  Service: Endoscopy;  Laterality: N/A;   COLONOSCOPY WITH PROPOFOL N/A 12/11/2020   Procedure: COLONOSCOPY WITH PROPOFOL;  Surgeon: TVirgel Manifold MD;  Location: [facility] ENDOSCOPY;  Service: Endoscopy;  Laterality: N/A;   CORONARY ANGIOPLASTY     CORONARY ARTERY BYPASS GRAFT     x 3   01/2018   ESOPHAGOGASTRODUODENOSCOPY N/A 02/07/2020   Procedure: ESOPHAGOGASTRODUODENOSCOPY (EGD);  Surgeon: TVirgel Manifold MD;  [facility];  Service: Endoscopy;  Laterality: N/A;   LEFT HEART CATH AND CORONARY ANGIOGRAPHY N/A 01/24/2018   Procedure: LEFT HEART CATH AND CORONARY ANGIOGRAPHY;  Surgeon: AWellington Hampshire MD;  [facility];  Service: Cardiovascular;  Laterality: N/A;   SVT ABLATION N/A 09/12/2021   Procedure: SVT ABLATION;  Surgeon: LVickie Epley MD;  [facility];  Service: Cardiovascular;  Laterality: N/A;   TONSILLECTOMY      Current Medications: Current Meds  Medication Sig   amiodarone (PACERONE) 200 MG tablet Take 2 tablets (400 mg total) by mouth 2 (two) times  daily for 5 days, THEN 2 tablets (400 mg total) daily for 5 days, THEN 1 tablet (200 mg total) daily.   aspirin 81 MG EC tablet Take 81 mg by mouth daily.    b complex vitamins tablet Take 1 tablet by mouth daily.    Dulaglutide 1.5 MG/0.5ML SOPN Inject 1.5 mg into the skin every Saturday.   empagliflozin (JARDIANCE) 25 MG TABS tablet Take 25 mg by mouth daily.   gabapentin (NEURONTIN) 100 MG capsule Take 1 capsule by mouth twice daily   insulin degludec (TRESIBA) 200 UNIT/ML FlexTouch Pen 24 Units at bedtime.   losartan  (COZAAR) 50 MG tablet Take 1 tablet (50 mg total) by mouth daily.   metFORMIN (GLUCOPHAGE) 500 MG tablet Take 2 tablets (1,000 mg total) by mouth 2 (two) times daily.   metolazone (ZAROXOLYN) 2.5 MG tablet Take 1 tablet (2.5 mg) by mouth once a week as needed for weight gain greater than 3 lbs   metoprolol succinate (TOPROL-XL) 50 MG 24 hr tablet Take 1 tablet (50 mg total) by mouth daily. Take with or immediately following a meal.   Omega-3 Fatty Acids (FISH OIL) 875 MG CAPS Take 875 mg by mouth 2 (two) times daily.   Pancrelipase, Lip-Prot-Amyl, (ZENPEP) 40000-126000 units CPEP Take 80,000 Units by mouth 3 (three) times daily with meals AND 40,000 Units with snacks.   pramipexole (MIRAPEX) 1.5 MG tablet Take 1 tablet (1.5 mg total) by mouth 2 (two) times daily.   rosuvastatin (CRESTOR) 20 MG tablet Take 1 tablet (20 mg total) by mouth daily.   spironolactone (ALDACTONE) 25 MG tablet Take 1 tablet (25 mg total) by mouth daily.   torsemide (DEMADEX) 20 MG tablet Take 2 tablets (40 mg total) by mouth daily. 40 mg daily     Allergies:   Entresto [sacubitril-valsartan], Lipitor [atorvastatin], and Pregabalin   Social History   Socioeconomic History   Marital status: Married    Spouse name: Not on file   Number of children: Not on file   Years of education: Not on file   Highest education level: Not on file  Occupational History   Not on file  Tobacco Use   Smoking status: Never   Smokeless tobacco: Never  Vaping Use   Vaping Use: Never used  Substance and Sexual Activity   Alcohol use: Not Currently    Comment: rare beer   Drug use: No   Sexual activity: Not on file  Other Topics Concern   Not on file  Social History Narrative   Lives locally with wife.  Works in [employer].  Does not routinely exercise.   Social Determinants of Health   Financial Resource Strain: Not on file  Food Insecurity: Not on file  Transportation Needs: Not on file  Physical Activity: Not on file   Stress: Not on file  Social Connections: Not on file     Family History: The patient's family history includes CAD in his father; Cancer in his father and mother; Other in his brother.  ROS:   Please see the history of present illness.    All other systems reviewed and are negative.  EKGs/Labs/Other Studies Reviewed:    The following studies were reviewed today:    EKG:  The ekg ordered today demonstrates sinus rhythm.  First-degree AV delay.  Right bundle branch block.  Recent Labs: 07/11/2021: TSH 2.780 08/29/2021: ALT 19 08/30/2021: BUN 44; Creatinine, Ser 1.49; Hemoglobin 11.6; Magnesium 2.2; Platelets 229; Potassium 3.9; Sodium 134  Recent Lipid Panel    Component Value Date/Time   CHOL 122 09/30/2020 1114   TRIG 121 09/30/2020 1114   HDL 35 (L) 09/30/2020 1114   CHOLHDL 3.5 09/30/2020 1114   VLDL 24 01/24/2018 1459   LDLCALC 67 09/30/2020 1114    Physical Exam:    VS:  BP 122/62 (BP Location: Left Arm, Patient Position: Sitting, Cuff Size: Normal)    Pulse 66    Ht '5\' 8"'$  (1.727 m)    Wt 216 lb (98 kg)    SpO2 100%    BMI 32.84 kg/m     Wt Readings from Last 3 Encounters:  10/08/21 216 lb (98 kg)  10/08/21 219 lb 1.6 oz (99.4 kg)  09/19/21 205 lb (93 kg)     GEN:  Well nourished, well developed in no acute distress HEENT: Normal NECK: No JVD; No carotid bruits LYMPHATICS: No lymphadenopathy CARDIAC: RRR, no murmurs, rubs, gallops RESPIRATORY:  Clear to auscultation without rales, wheezing or rhonchi  ABDOMEN: Soft, non-tender, non-distended MUSCULOSKELETAL:  No edema; No deformity  SKIN: Warm and dry NEUROLOGIC:  Alert and oriented x 3 PSYCHIATRIC:  Normal affect        ASSESSMENT:    1. PSVT (paroxysmal supraventricular tachycardia) ([place])   2. Atrial tachycardia ([place])   3. Encounter for long-term (current) use of high-risk medication    PLAN:    In order of problems listed above:  #PSVT #Atrial tachycardia Patient with inducible atrial  tachycardia during recent EP study.  Electroanatomic mapping demonstrated the earliest activation near the His bundle/compact AV node.  Ablation was deferred and the patient was kept on amiodarone.  For now, would continue amiodarone 200 mg by mouth once daily.  Very limited options for antiarrhythmics given his comorbidities.  He will need liver and thyroid monitoring every 6 months and yearly PFTs.   Follow-up in 6 months with APP.  Blood work at that visit.  Arrange for PFTs at that visit.  Medication Adjustments/Labs and Tests Ordered: Current medicines are reviewed at length with the patient today.  Concerns regarding medicines are outlined above.  No orders of the defined types were placed in this encounter.  No orders of the defined types were placed in this encounter.    Signed, Lars Mage, MD, [facility], [facility] 10/08/2021 1:28 PM    Electrophysiology [facility]

## 2021-10-08 NOTE — Patient Instructions (Addendum)
Medications: ?Increase Torsemide to 40 mg two times a day for 5 days  ?Your physician recommends that you continue on your current medications as directed. Please refer to the Current Medication list given to you today. ?*If you need a refill on your cardiac medications before your next appointment, please call your pharmacy* ? ?Lab Work: ?None. ?If you have labs (blood work) drawn today and your tests are completely normal, you will receive your results only by: ?MyChart Message (if you have MyChart) OR ?A paper copy in the mail ?If you have any lab test that is abnormal or we need to change your treatment, we will call you to review the results. ? ?Testing/Procedures: ?None. ? ?Follow-Up: ?At [facility], you and your health needs are our priority.  As part of our continuing mission to provide you with exceptional heart care, we have created designated Provider Care Teams.  These Care Teams include your primary Cardiologist (physician) and Advanced Practice Providers (APPs -  Physician Assistants and Nurse Practitioners) who all work together to provide you with the care you need, when you need it. ? ?Your physician wants you to follow-up in: 6 months with one of the following Advanced Practice Providers on your designated Care Team:   ? ?Ignacia Bayley, NP ?Christell Faith PA ?Cadence Kathlen Mody PA ? ? ?We recommend signing up for the patient portal called "MyChart".  Sign up information is provided on this After Visit Summary.  MyChart is used to connect with patients for Virtual Visits (Telemedicine).  Patients are able to view lab/test results, encounter notes, upcoming appointments, etc.  Non-urgent messages can be sent to your provider as well.   ?To learn more about what you can do with MyChart, go to [email or web address].   ? ?Any Other Special Instructions Will Be Listed Below (If Applicable). ? ?

## 2021-10-08 NOTE — Assessment & Plan Note (Signed)
Patient has a history of triple bypass surgery.  He also has LV dysfunction.  I think he is considered totally and permanently disabled for any job ?

## 2021-10-08 NOTE — Assessment & Plan Note (Signed)
Stable

## 2021-10-08 NOTE — Assessment & Plan Note (Signed)
Ablation of atrial fibrillation was unsuccessful ?

## 2021-10-08 NOTE — Assessment & Plan Note (Signed)
Blood pressure labile ?

## 2021-10-08 NOTE — Assessment & Plan Note (Signed)

## 2021-10-08 NOTE — Progress Notes (Signed)
Established Patient Office Visit  Subjective:  Patient ID: Ricardo Brown, male    DOB: 26-Feb-1961  Age: 61 y.o. MRN: [id number]  CC:  Chief Complaint  Patient presents with   Diabetes    1 month follow up    Diabetes Pertinent negatives for hypoglycemia include no seizures. Pertinent negatives for diabetes include no chest pain.   Ricardo Brown presents for check up  Past Medical History:  Diagnosis Date   Chronic combined systolic (congestive) and diastolic (congestive) heart failure ([place])    a. 01/2018 EF 35-40%; b. 10/2020 Echo: EF 40-45%, glob HK. RVSP 42.37mHg. Mildly dil LA. Mild MS/AoV sclerosis.   CKD (chronic kidney disease), stage Brown (HCC)    Coronary artery disease    a. 01/2018 late presenting inferior MI; b. 01/2018 Cath: Severe multivessel dzs-->CABG x 3 (LIMA->LAD, VG->OM2, VG->RPDA @ [facility]).   Diabetes mellitus without complication ([place]    a. Dx ~ 2000.   Heart palpitations    a. Pt reports prior nl echo's and stress tests. Last stress test w/in past 2 yrs - PCP.   High cholesterol    Hypertension    Mitral regurgitation    a. 01/2018 s/p MV repair @ time of CABG.   Myocardial infarction ([facility]    Postoperative atrial fibrillation    a. 01/2018 @ time of CABG.   PSVT (paroxysmal [place]ventricular tachycardia) ([place]    a. Very symptomatic with multiple ED evaluations.  Has been on amio.   STEMI (ST elevation myocardial infarction) ([place] 01/24/2018    Past Surgical History:  Procedure Laterality Date   AMPUTATION TOE Right 08/29/2021   Procedure: AMPUTATION TOE-2nd toe;  Surgeon: CSharlotte Alamo DPM;  Location: [facility] ORS;  Service: Podiatry;  Laterality: Right;   CARDIAC CATHETERIZATION     CARDIAC VALVE REPLACEMENT     Mitral Valve Repair   COLONOSCOPY WITH PROPOFOL N/A 06/07/2019   Procedure: COLONOSCOPY WITH PROPOFOL;  Surgeon: TVirgel Manifold MD;  Location: [facility] ENDOSCOPY;  Service: Endoscopy;  Laterality: N/A;   COLONOSCOPY WITH  PROPOFOL N/A 02/07/2020   Procedure: COLONOSCOPY WITH PROPOFOL;  Surgeon: TVirgel Manifold MD;  Location: [facility] ENDOSCOPY;  Service: Endoscopy;  Laterality: N/A;   COLONOSCOPY WITH PROPOFOL N/A 12/11/2020   Procedure: COLONOSCOPY WITH PROPOFOL;  Surgeon: TVirgel Manifold MD;  Location: [facility] ENDOSCOPY;  Service: Endoscopy;  Laterality: N/A;   CORONARY ANGIOPLASTY     CORONARY ARTERY BYPASS GRAFT     x 3   01/2018   ESOPHAGOGASTRODUODENOSCOPY N/A 02/07/2020   Procedure: ESOPHAGOGASTRODUODENOSCOPY (EGD);  Surgeon: TVirgel Manifold MD;  [facility];  Service: Endoscopy;  Laterality: N/A;   LEFT HEART CATH AND CORONARY ANGIOGRAPHY N/A 01/24/2018   Procedure: LEFT HEART CATH AND CORONARY ANGIOGRAPHY;  Surgeon: AWellington Hampshire MD;  [facility];  Service: Cardiovascular;  Laterality: N/A;   SVT ABLATION N/A 09/12/2021   Procedure: SVT ABLATION;  Surgeon: LVickie Epley MD;  [facility];  Service: Cardiovascular;  Laterality: N/A;   TONSILLECTOMY      Family History  Problem Relation Age of Onset   Cancer Mother        died @ 743  CAD Father        First MI @ 89 S/p heart transplant. Died in mid-50's of cancer.   Cancer Father    Other Brother        alive and well    Social History  Socioeconomic History   Marital status: Married    Spouse name: Not on file   Number of children: Not on file   Years of education: Not on file   Highest education level: Not on file  Occupational History   Not on file  Tobacco Use   Smoking status: Never   Smokeless tobacco: Never  Vaping Use   Vaping Use: Never used  Substance and Sexual Activity   Alcohol use: Not Currently    Comment: rare beer   Drug use: No   Sexual activity: Not on file  Other Topics Concern   Not on file  Social History Narrative   Lives locally with wife.  Works in [employer].  Does not routinely exercise.   Social Determinants of Health   Financial  Resource Strain: Not on file  Food Insecurity: Not on file  Transportation Needs: Not on file  Physical Activity: Not on file  Stress: Not on file  Social Connections: Not on file  Intimate Partner Violence: Not on file     Current Outpatient Medications:    amiodarone (PACERONE) 200 MG tablet, Take 2 tablets (400 mg total) by mouth 2 (two) times daily for 5 days, THEN 2 tablets (400 mg total) daily for 5 days, THEN 1 tablet (200 mg total) daily., Disp: 110 tablet, Rfl: 0   aspirin 81 MG EC tablet, Take 81 mg by mouth daily. , Disp: , Rfl:    b complex vitamins tablet, Take 1 tablet by mouth daily. , Disp: , Rfl:    Dulaglutide 1.5 MG/0.5ML SOPN, Inject 1.5 mg into the skin every Saturday., Disp: , Rfl:    empagliflozin (JARDIANCE) 25 MG TABS tablet, Take 25 mg by mouth daily., Disp: , Rfl:    gabapentin (NEURONTIN) 100 MG capsule, Take 1 capsule by mouth twice daily, Disp: 60 capsule, Rfl: 0   insulin degludec (TRESIBA) 200 UNIT/ML FlexTouch Pen, 24 Units at bedtime., Disp: , Rfl:    losartan (COZAAR) 50 MG tablet, Take 1 tablet (50 mg total) by mouth daily., Disp: 90 tablet, Rfl: 1   metFORMIN (GLUCOPHAGE) 500 MG tablet, Take 2 tablets (1,000 mg total) by mouth 2 (two) times daily., Disp: 360 tablet, Rfl: 2   metolazone (ZAROXOLYN) 2.5 MG tablet, Take 1 tablet (2.5 mg) by mouth once a week as needed for weight gain greater than 3 lbs, Disp: , Rfl:    metoprolol succinate (TOPROL-XL) 50 MG 24 hr tablet, Take 1 tablet (50 mg total) by mouth daily. Take with or immediately following a meal., Disp: 90 tablet, Rfl: 3   Omega-3 Fatty Acids (FISH OIL) 875 MG CAPS, Take 875 mg by mouth 2 (two) times daily., Disp: , Rfl:    Pancrelipase, Lip-Prot-Amyl, (ZENPEP) 40000-126000 units CPEP, Take 80,000 Units by mouth 3 (three) times daily with meals AND 40,000 Units with snacks., Disp: 600 capsule, Rfl: 1   pramipexole (MIRAPEX) 1.5 MG tablet, Take 1 tablet (1.5 mg total) by mouth 2 (two) times daily.,  Disp: 180 tablet, Rfl: 3   rosuvastatin (CRESTOR) 20 MG tablet, Take 1 tablet (20 mg total) by mouth daily., Disp: 90 tablet, Rfl: 0   spironolactone (ALDACTONE) 25 MG tablet, Take 1 tablet (25 mg total) by mouth daily., Disp: 90 tablet, Rfl: 1   torsemide (DEMADEX) 20 MG tablet, Take 2 tablets (40 mg total) by mouth daily. 40 mg daily, Disp: 180 tablet, Rfl: 3   Allergies  Allergen Reactions   Entresto [Sacubitril-Valsartan] Other (See Comments)  SVT   Lipitor [Atorvastatin] Palpitations    SVT   Pregabalin Palpitations    SVT    ROS Review of Systems  Constitutional:  Negative for appetite change and chills.  HENT:  Negative for congestion.   Eyes:  Negative for pain.  Respiratory:  Positive for shortness of breath and wheezing. Negative for cough.   Cardiovascular:  Positive for palpitations. Negative for chest pain and leg swelling.  Gastrointestinal:  Negative for blood in stool.  Musculoskeletal:  Positive for arthralgias and myalgias.  Neurological:  Negative for seizures.  Psychiatric/Behavioral:  Negative for behavioral problems.      Objective:    Physical Exam  BP 140/72    Pulse 68    Ht '5\' 8"'  (1.727 m)    Wt 219 lb 1.6 oz (99.4 kg)    BMI 33.31 kg/m  Wt Readings from Last 3 Encounters:  10/08/21 219 lb 1.6 oz (99.4 kg)  09/19/21 205 lb (93 kg)  09/12/21 204 lb (92.5 kg)     Health Maintenance Due  Topic Date Due   COVID-19 Vaccine (1) Never done   Hepatitis C Screening  Never done   Zoster Vaccines- Shingrix (1 of 2) Never done   OPHTHALMOLOGY EXAM  10/01/2021    There are no preventive care reminders to display for this patient.  Lab Results  Component Value Date   TSH 2.780 07/11/2021   Lab Results  Component Value Date   WBC 10.4 08/30/2021   HGB 11.6 (L) 08/30/2021   HCT 34.8 (L) 08/30/2021   MCV 89.0 08/30/2021   PLT 229 08/30/2021   Lab Results  Component Value Date   NA 134 (L) 08/30/2021   K 3.9 08/30/2021   CO2 25 08/30/2021    GLUCOSE 190 (H) 08/30/2021   BUN 44 (H) 08/30/2021   CREATININE 1.49 (H) 08/30/2021   BILITOT 0.6 08/29/2021   ALKPHOS 47 08/29/2021   AST 19 08/29/2021   ALT 19 08/29/2021   PROT 7.0 08/29/2021   ALBUMIN 3.4 (L) 08/29/2021   CALCIUM 8.9 08/30/2021   ANIONGAP 9 08/30/2021   EGFR 29 (L) 08/26/2021   Lab Results  Component Value Date   CHOL 122 09/30/2020   Lab Results  Component Value Date   HDL 35 (L) 09/30/2020   Lab Results  Component Value Date   LDLCALC 67 09/30/2020   Lab Results  Component Value Date   TRIG 121 09/30/2020   Lab Results  Component Value Date   CHOLHDL 3.5 09/30/2020   Lab Results  Component Value Date   HGBA1C 7.4 (H) 08/28/2021   Patient has a history of congestive heart failure and coronary artery bypass surgery.  He cannot walk more than 200 yards on good days.  He has a history of bypass surgery also has pain in the legs on walking.  He should be considered totally and permanently disabled for any job   Assessment & Plan:   Problem List Items Addressed This Visit       Cardiovascular and Mediastinum   Hypertension    Blood pressure labile      Postoperative atrial fibrillation (HCC)    Ablation of atrial fibrillation was unsuccessful      Coronary artery disease    Patient has a history of triple bypass surgery.  He also has LV dysfunction.  I think he is considered totally and permanently disabled for any job      Chronic combined systolic (congestive) and diastolic (congestive) heart  failure [facility])    Patient has a low ejection fraction chest does not show any rales or rhonchi, he has a mitral and aortic valve sclerosis without any other significant gradient across the valve      PAD (peripheral artery disease) (HCC)    Stable        Endocrine   Diabetes mellitus type 2, uncomplicated ([place]) - Primary    - The patient's blood sugar is labile on med. - The patient will continue the current treatment regimen.  - I  encouraged the patient to regularly check blood sugar.  - I encouraged the patient to monitor diet. I encouraged the patient to eat low-carb and low-sugar to help prevent blood sugar spikes.  - I encouraged the patient to continue following their prescribed treatment plan for diabetes - I informed the patient to get help if blood sugar drops below 71m/dL, or if suddenly have trouble thinking clearly or breathing.  Patient was advised to buy a book on diabetes from a local bookstore or from [place]  Patient should read 2 chapters every day to keep the motivation going, this is in addition to some of the materials we provided them from the office.  There are other resources on the Internet like YouTube and wilkipedia to get an education on the diabetes      Relevant Orders   POCT glucose (manual entry) (Completed)    No orders of the defined types were placed in this encounter.   Follow-up: No follow-ups on file.    JCletis Athens MD

## 2021-10-08 NOTE — Assessment & Plan Note (Signed)
Patient has a low ejection fraction chest does not show any rales or rhonchi, he has a mitral and aortic valve sclerosis without any other significant gradient across the valve ?

## 2021-10-21 ENCOUNTER — Telehealth: Payer: Self-pay | Admitting: Gastroenterology

## 2021-10-21 NOTE — Telephone Encounter (Signed)
Called pt to reschedule appt, no answer, left vm ?

## 2021-10-23 ENCOUNTER — Other Ambulatory Visit: Payer: Self-pay | Admitting: *Deleted

## 2021-10-23 DIAGNOSIS — E663 Overweight: Secondary | ICD-10-CM | POA: Insufficient documentation

## 2021-10-23 MED ORDER — METFORMIN HCL 500 MG PO TABS: 1000.0000 mg | ORAL_TABLET | Freq: Two times a day (BID) | ORAL | 2 refills | Status: DC

## 2021-11-05 ENCOUNTER — Encounter: Payer: Self-pay | Admitting: Internal Medicine

## 2021-11-05 ENCOUNTER — Ambulatory Visit (INDEPENDENT_AMBULATORY_CARE_PROVIDER_SITE_OTHER): Payer: 59 | Admitting: Internal Medicine

## 2021-11-05 VITALS — BP 121/68 | HR 68 | Ht 68.0 in | Wt 215.5 lb

## 2021-11-05 DIAGNOSIS — I471 Supraventricular tachycardia, unspecified: Secondary | ICD-10-CM

## 2021-11-05 DIAGNOSIS — Z794 Long term (current) use of insulin: Secondary | ICD-10-CM

## 2021-11-05 DIAGNOSIS — I10 Essential (primary) hypertension: Secondary | ICD-10-CM | POA: Diagnosis not present

## 2021-11-05 DIAGNOSIS — E119 Type 2 diabetes mellitus without complications: Secondary | ICD-10-CM | POA: Diagnosis not present

## 2021-11-05 DIAGNOSIS — I25810 Atherosclerosis of coronary artery bypass graft(s) without angina pectoris: Secondary | ICD-10-CM

## 2021-11-05 DIAGNOSIS — Z951 Presence of aortocoronary bypass graft: Secondary | ICD-10-CM

## 2021-11-05 DIAGNOSIS — E7800 Pure hypercholesterolemia, unspecified: Secondary | ICD-10-CM

## 2021-11-05 LAB — GLUCOSE, POCT (MANUAL RESULT ENTRY): POC Glucose: 131 mg/dl — AB (ref 70–99)

## 2021-11-05 MED ORDER — GABAPENTIN 100 MG PO CAPS: 100.0000 mg | ORAL_CAPSULE | Freq: Two times a day (BID) | ORAL | 6 refills | Status: DC

## 2021-11-05 NOTE — Assessment & Plan Note (Signed)
Denies any chest pain, he had a episode of hypotension, his blood pressure is okay today.  Chest is clear abdomen is soft nontender.  No wheezing was noted. ?

## 2021-11-05 NOTE — Assessment & Plan Note (Signed)
Patient has evidence of left ventricular dysfunction. ?

## 2021-11-05 NOTE — Progress Notes (Signed)
? ?Established Patient Office Visit ? ?Subjective:  ?Patient ID: Ricardo Brown, male    DOB: 03/17/1961  Age: 61 y.o. MRN: [id number] ? ?CC:  ?Chief Complaint  ?Patient presents with  ? Diabetes  ? ? ?Diabetes ? ? ?Ricardo Brown presents for general check ? ?Past Medical History:  ?Diagnosis Date  ? Chronic combined systolic (congestive) and diastolic (congestive) heart failure (HCC)   ? a. 01/2018 EF 35-40%; b. 10/2020 Echo: EF 40-45%, glob HK. RVSP 42.61mHg. Mildly dil LA. Mild MS/AoV sclerosis.  ? CKD (chronic kidney disease), stage Brown ([facility]   ? Coronary artery disease   ? a. 01/2018 late presenting inferior MI; b. 01/2018 Cath: Severe multivessel dzs-->CABG x 3 (LIMA->LAD, VG->OM2, VG->RPDA @ [facility]).  ? Diabetes mellitus without complication ([facility]   ? a. Dx ~ 2000.  ? Heart palpitations   ? a. Pt reports prior nl echo's and stress tests. Last stress test w/in past 2 yrs - PCP.  ? High cholesterol   ? Hypertension   ? Mitral regurgitation   ? a. 01/2018 s/p MV repair @ time of CABG.  ? Myocardial infarction ([facility]   ? Postoperative atrial fibrillation   ? a. 01/2018 @ time of CABG.  ? PSVT (paroxysmal supraventricular tachycardia) ([facility]   ? a. Very symptomatic with multiple ED evaluations.  Has been on amio.  ? STEMI (ST elevation myocardial infarction) ([place] 01/24/[place]  ? ? ?Past Surgical History:  ?Procedure Laterality Date  ? AMPUTATION TOE Right 08/29/2021  ? Procedure: AMPUTATION TOE-2nd toe;  Surgeon: CSharlotte Alamo DPM;  Location: [facility] OR[facility];  Service: Podiatry;  Laterality: Right;  ? CARDIAC CATHETERIZATION    ? CARDIAC VALVE REPLACEMENT    ? Mitral Valve Repair  ? COLONOSCOPY WITH PROPOFOL N/A 06/07/2019  ? Procedure: COLONOSCOPY WITH PROPOFOL;  Surgeon: TVirgel Manifold MD;  Location: [facility] ENDOSCOPY;  Service: Endoscopy;  Laterality: N/A;  ? COLONOSCOPY WITH PROPOFOL N/A 02/07/2020  ? Procedure: COLONOSCOPY WITH PROPOFOL;  Surgeon: TVirgel Manifold MD;  Location: [facility] ENDOSCOPY;  Service:  Endoscopy;  Laterality: N/A;  ? COLONOSCOPY WITH PROPOFOL N/A 12/11/2020  ? Procedure: COLONOSCOPY WITH PROPOFOL;  Surgeon: TVirgel Manifold MD;  Location: [facility] ENDOSCOPY;  Service: Endoscopy;  Laterality: N/A;  ? CORONARY ANGIOPLASTY    ? CORONARY ARTERY BYPASS GRAFT    ? x 3   01/2018  ? ESOPHAGOGASTRODUODENOSCOPY N/A 02/07/2020  ? Procedure: ESOPHAGOGASTRODUODENOSCOPY (EGD);  Surgeon: TVirgel Manifold MD;  [facility];  Service: Endoscopy;  Laterality: N/A;  ? LEFT HEART CATH AND CORONARY ANGIOGRAPHY N/A 01/24/2018  ? Procedure: LEFT HEART CATH AND CORONARY ANGIOGRAPHY;  Surgeon: AWellington Hampshire MD;  [facility];  Service: Cardiovascular;  Laterality: N/A;  ? SVT ABLATION N/A 09/12/2021  ? Procedure: SVT ABLATION;  Surgeon: LVickie Epley MD;  [facility];  Service: Cardiovascular;  Laterality: N/A;  ? TONSILLECTOMY    ? ? ?Family History  ?Problem Relation Age of Onset  ? Cancer Mother   ?     died @ 764 ? CAD Father   ?     First MI @ 89 S/p heart transplant. Died in mid-50's of cancer.  ? Cancer Father   ? Other Brother   ?     alive and well  ? ? ?Social History  ? ?Socioeconomic History  ? Marital status: Married  ?  Spouse name: Not on file  ? Number of children:  Not on file  ? Years of education: Not on file  ? Highest education level: Not on file  ?Occupational History  ? Not on file  ?Tobacco Use  ? Smoking status: Never  ? Smokeless tobacco: Never  ?Vaping Use  ? Vaping Use: Never used  ?Substance and Sexual Activity  ? Alcohol use: Not Currently  ?  Comment: rare beer  ? Drug use: No  ? Sexual activity: Not on file  ?Other Topics Concern  ? Not on file  ?Social History Narrative  ? Lives locally with wife.  Works in [employer].  Does not routinely exercise.  ? ?Social Determinants of Health  ? ?Financial Resource Strain: Not on file  ?Food Insecurity: Not on file  ?Transportation Needs: Not on file  ?Physical Activity: Not on file   ?Stress: Not on file  ?Social Connections: Not on file  ?Intimate Partner Violence: Not on file  ? ? ? ?Current Outpatient Medications:  ?  amiodarone (PACERONE) 200 MG tablet, Take 2 tablets (400 mg total) by mouth 2 (two) times daily for 5 days, THEN 2 tablets (400 mg total) daily for 5 days, THEN 1 tablet (200 mg total) daily., Disp: 110 tablet, Rfl: 0 ?  aspirin 81 MG EC tablet, Take 81 mg by mouth daily. , Disp: , Rfl:  ?  b complex vitamins tablet, Take 1 tablet by mouth daily. , Disp: , Rfl:  ?  Dulaglutide 1.5 MG/0.5ML SOPN, Inject 1.5 mg into the skin every Saturday., Disp: , Rfl:  ?  empagliflozin (JARDIANCE) 25 MG TABS tablet, Take 25 mg by mouth daily., Disp: , Rfl:  ?  gabapentin (NEURONTIN) 100 MG capsule, Take 1 capsule (100 mg total) by mouth 2 (two) times daily., Disp: 60 capsule, Rfl: 6 ?  insulin degludec (TRESIBA) 200 UNIT/ML FlexTouch Pen, 24 Units at bedtime., Disp: , Rfl:  ?  losartan (COZAAR) 50 MG tablet, Take 1 tablet (50 mg total) by mouth daily., Disp: 90 tablet, Rfl: 1 ?  metFORMIN (GLUCOPHAGE) 500 MG tablet, Take 2 tablets (1,000 mg total) by mouth 2 (two) times daily., Disp: 360 tablet, Rfl: 2 ?  metolazone (ZAROXOLYN) 2.5 MG tablet, Take 1 tablet (2.5 mg) by mouth once a week as needed for weight gain greater than 3 lbs, Disp: , Rfl:  ?  metoprolol succinate (TOPROL-XL) 50 MG 24 hr tablet, Take 1 tablet (50 mg total) by mouth daily. Take with or immediately following a meal., Disp: 90 tablet, Rfl: 3 ?  Omega-3 Fatty Acids (FISH OIL) 875 MG CAPS, Take 875 mg by mouth 2 (two) times daily., Disp: , Rfl:  ?  Pancrelipase, Lip-Prot-Amyl, (ZENPEP) 40000-126000 units CPEP, Take 80,000 Units by mouth 3 (three) times daily with meals AND 40,000 Units with snacks., Disp: 600 capsule, Rfl: 1 ?  pramipexole (MIRAPEX) 1.5 MG tablet, Take 1 tablet (1.5 mg total) by mouth 2 (two) times daily., Disp: 180 tablet, Rfl: 3 ?  rosuvastatin (CRESTOR) 20 MG tablet, Take 1 tablet (20 mg total) by mouth  daily., Disp: 90 tablet, Rfl: 0 ?  spironolactone (ALDACTONE) 25 MG tablet, Take 1 tablet (25 mg total) by mouth daily., Disp: 90 tablet, Rfl: 1 ?  torsemide (DEMADEX) 20 MG tablet, Take 2 tablets (40 mg total) by mouth daily. 40 mg daily, Disp: 180 tablet, Rfl: 3  ? ?Allergies  ?Allergen Reactions  ? Entresto [Sacubitril-Valsartan] Other (See Comments)  ?  SVT  ? Lipitor [Atorvastatin] Palpitations  ?  SVT  ? Pregabalin  Palpitations  ?  SVT  ? ? ?ROS ?Review of Systems  ?Constitutional: Negative.   ?HENT: Negative.    ?Eyes: Negative.   ?Respiratory: Negative.    ?Cardiovascular: Negative.   ?Gastrointestinal: Negative.   ?Endocrine: Negative.   ?Genitourinary: Negative.   ?Musculoskeletal: Negative.   ?Skin: Negative.   ?Allergic/Immunologic: Negative.   ?Neurological: Negative.   ?Hematological: Negative.   ?Psychiatric/Behavioral: Negative.    ?All other systems reviewed and are negative. ? ?  ?Objective:  ?  ?Physical Exam ?Vitals reviewed.  ?Constitutional:   ?   Appearance: Normal appearance.  ?HENT:  ?   Mouth/Throat:  ?   Mouth: Mucous membranes are moist.  ?Eyes:  ?   Pupils: Pupils are equal, round, and reactive to light.  ?Neck:  ?   Vascular: No carotid bruit.  ?Cardiovascular:  ?   Rate and Rhythm: Normal rate and regular rhythm.  ?   Pulses: Normal pulses.  ?   Heart sounds: Normal heart sounds.  ?Pulmonary:  ?   Effort: Pulmonary effort is normal.  ?   Breath sounds: Normal breath sounds.  ?Abdominal:  ?   General: Bowel sounds are normal.  ?   Palpations: Abdomen is soft. There is no hepatomegaly, splenomegaly or mass.  ?   Tenderness: There is no abdominal tenderness.  ?   Hernia: No hernia is present.  ?Musculoskeletal:  ?   Cervical back: Neck supple.  ?   Right lower leg: No edema.  ?   Left lower leg: No edema.  ?Skin: ?   Findings: No rash.  ?Neurological:  ?   Mental Status: He is alert and oriented to person, place, and time.  ?   Motor: No weakness.  ?Psychiatric:     ?   Mood and Affect:  Mood normal.     ?   Behavior: Behavior normal.  ? ? ?BP 121/68   Pulse 68   Ht '5\' 8"'$  (1.727 m)   Wt 215 lb 8 oz (97.8 kg)   BMI 32.77 kg/m?  ?Wt Readings from Last 3 Encounters:  ?11/05/21 215 lb 8 oz (97.8 kg)

## 2021-11-05 NOTE — Assessment & Plan Note (Signed)
Hypercholesterolemia  I advised the patient to follow Mediterranean diet This diet is rich in fruits vegetables and whole grain, and This diet is also rich in fish and lean meat Patient should also eat a handful of almonds or walnuts daily Recent heart study indicated that average follow-up on this kind of diet reduces the cardiovascular mortality by 50 to 70%== 

## 2021-11-05 NOTE — Assessment & Plan Note (Signed)
Stable on amiodarone ?

## 2021-11-05 NOTE — Assessment & Plan Note (Signed)

## 2021-11-23 LAB — AEROBIC/ANAEROBIC CULTURE W GRAM STAIN (SURGICAL/DEEP WOUND): Gram Stain: NONE SEEN

## 2021-12-02 ENCOUNTER — Telehealth: Payer: Self-pay | Admitting: Gastroenterology

## 2021-12-02 NOTE — Telephone Encounter (Signed)
Medical records were mailed out on 12/02/2021 to [facility]  for D.O.S  ?08/2018 to present ?

## 2021-12-03 ENCOUNTER — Ambulatory Visit (INDEPENDENT_AMBULATORY_CARE_PROVIDER_SITE_OTHER): Payer: 59 | Admitting: Internal Medicine

## 2021-12-03 ENCOUNTER — Encounter: Payer: Self-pay | Admitting: Internal Medicine

## 2021-12-03 VITALS — BP 115/64 | HR 72 | Ht 68.0 in | Wt 217.1 lb

## 2021-12-03 DIAGNOSIS — E119 Type 2 diabetes mellitus without complications: Secondary | ICD-10-CM

## 2021-12-03 DIAGNOSIS — I10 Essential (primary) hypertension: Secondary | ICD-10-CM

## 2021-12-03 DIAGNOSIS — I25810 Atherosclerosis of coronary artery bypass graft(s) without angina pectoris: Secondary | ICD-10-CM | POA: Diagnosis not present

## 2021-12-03 DIAGNOSIS — I5042 Chronic combined systolic (congestive) and diastolic (congestive) heart failure: Secondary | ICD-10-CM

## 2021-12-03 DIAGNOSIS — Z794 Long term (current) use of insulin: Secondary | ICD-10-CM

## 2021-12-03 DIAGNOSIS — Z951 Presence of aortocoronary bypass graft: Secondary | ICD-10-CM

## 2021-12-03 LAB — GLUCOSE, POCT (MANUAL RESULT ENTRY): POC Glucose: 169 mg/dl — AB (ref 70–99)

## 2021-12-03 NOTE — Assessment & Plan Note (Signed)
Patient denies any chest pain palpitation or syncope ?

## 2021-12-03 NOTE — Assessment & Plan Note (Signed)

## 2021-12-03 NOTE — Assessment & Plan Note (Signed)
Patient denies chest pain but he cannot do very much because of LV dysfunction. ?

## 2021-12-03 NOTE — Progress Notes (Signed)
? ?Established Patient Office Visit ? ?Subjective:  ?Patient ID: Ricardo Brown, male    DOB: [birth year]  Age: 61 y.o. MRN: [id number] ? ?CC:  ?Chief Complaint  ?Patient presents with  ? Diabetes  ? ? ?Diabetes ? ? ?Ricardo Brown presents for dermatitis of the left.  His skin is dry and he is using a moisturizing cream.  I told him to stop using gain to wash his clothes.  Patient is known to have coronary artery disease has a lower ejection fraction 40 to 45%.  He also has CKD.  He denies any chest pain ?Is planning to have an echocardiogram soon, patient has not tolerated Entresto well in the past.  He was referred to the skin doctor for dermatitis.  Blood pressure sugar today is 169 ? ?Past Medical History:  ?Diagnosis Date  ? Chronic combined systolic (congestive) and diastolic (congestive) heart failure (HCC)   ? a. 01/2018 EF 35-40%; b. 10/2020 Echo: EF 40-45%, glob HK. RVSP 42.48mHg. Mildly dil LA. Mild MS/AoV sclerosis.  ? CKD (chronic kidney disease), stage Brown ([place]   ? Coronary artery disease   ? a. 01/2018 late presenting inferior MI; b. 01/2018 Cath: Severe multivessel dzs-->CABG x 3 (LIMA->LAD, VG->OM2, VG->RPDA @ [facility]).  ? Diabetes mellitus without complication ([facility]   ? a. Dx ~ 2000.  ? Heart palpitations   ? a. Pt reports prior nl echo's and stress tests. Last stress test w/in past 2 yrs - PCP.  ? High cholesterol   ? Hypertension   ? Mitral regurgitation   ? a. 01/2018 s/p MV repair @ time of CABG.  ? [facility] infarction ([facility]   ? Postoperative atrial fibrillation   ? a. 01/2018 @ time of CABG.  ? PSVT (paroxysmal supraventricular tachycardia) ([place]   ? a. Very symptomatic with multiple ED evaluations.  Has been on amio.  ? STEMI (ST elevation myocardial infarction) ([facility] 01/24/2018  ? ? ?Past Surgical History:  ?Procedure Laterality Date  ? AMPUTATION TOE Right 08/29/2021  ? Procedure: AMPUTATION TOE-2nd toe;  Surgeon: CSharlotte Alamo DPM;  Location: [facility] ORS;  Service: Podiatry;   Laterality: Right;  ? CARDIAC CATHETERIZATION    ? CARDIAC VALVE REPLACEMENT    ? Mitral Valve Repair  ? COLONOSCOPY WITH PROPOFOL N/A 06/07/2019  ? Procedure: COLONOSCOPY WITH PROPOFOL;  Surgeon: TVirgel Manifold MD;  Location: [facility] END[facility];  Service: Endoscopy;  Laterality: N/A;  ? COLONOSCOPY WITH PROPOFOL N/A 02/07/2020  ? Procedure: COLONOSCOPY WITH PROPOFOL;  Surgeon: TVirgel Manifold MD;  Location: [facility] END[facility];  Service: Endoscopy;  Laterality: N/A;  ? COLONOSCOPY WITH PROPOFOL N/A 12/11/2020  ? Procedure: COLONOSCOPY WITH PROPOFOL;  Surgeon: TVirgel Manifold MD;  Location: [facility] END[facility];  Service: Endoscopy;  Laterality: N/A;  ? CORONARY ANGIOPLASTY    ? CORONARY ARTERY BYPASS GRAFT    ? x 3   01/2018  ? ESOPHAGOGASTRODUODENOSCOPY N/A 02/07/2020  ? Procedure: ESOPHAGOGASTRODUODENOSCOPY (EGD);  Surgeon: TVirgel Manifold MD;  [facility];  Service: Endoscopy;  Laterality: N/A;  ? LEFT HEART CATH AND CORONARY ANGIOGRAPHY N/A 01/24/2018  ? Procedure: LEFT HEART CATH AND CORONARY ANGIOGRAPHY;  Surgeon: AWellington Hampshire MD;  [facility];  Service: Cardiovascular;  Laterality: N/A;  ? SVT ABLATION N/A 09/12/2021  ? Procedure: SVT ABLATION;  Surgeon: LVickie Epley MD;  [facility];  Service: Cardiovascular;  Laterality: N/A;  ? TONSILLECTOMY    ? ? ?Family History  ?Problem  Relation Age of Onset  ? Cancer Mother   ?     died @ 16  ? CAD Father   ?     First MI @ 76. S/p heart transplant. Died in mid-50's of cancer.  ? Cancer Father   ? Other Brother   ?     alive and well  ? ? ?Social History  ? ?Socioeconomic History  ? Marital status: Married  ?  Spouse name: Not on file  ? Number of children: Not on file  ? Years of education: Not on file  ? Highest education level: Not on file  ?Occupational History  ? Not on file  ?Tobacco Use  ? Smoking status: Never  ? Smokeless tobacco: Never  ?Vaping Use  ? Vaping Use: Never used  ?Substance and  Sexual Activity  ? Alcohol use: Not Currently  ?  Comment: rare beer  ? Drug use: No  ? Sexual activity: Not on file  ?Other Topics Concern  ? Not on file  ?Social History Narrative  ? Lives locally with wife.  Works in [employer].  Does not routinely exercise.  ? ?Social Determinants of Health  ? ?Financial Resource Strain: Not on file  ?Food Insecurity: Not on file  ?Transportation Needs: Not on file  ?Physical Activity: Not on file  ?Stress: Not on file  ?Social Connections: Not on file  ?Intimate Partner Violence: Not on file  ? ? ? ?Current Outpatient Medications:  ?  amiodarone (PACERONE) 200 MG tablet, Take 2 tablets (400 mg total) by mouth 2 (two) times daily for 5 days, THEN 2 tablets (400 mg total) daily for 5 days, THEN 1 tablet (200 mg total) daily., Disp: 110 tablet, Rfl: 0 ?  aspirin 81 MG EC tablet, Take 81 mg by mouth daily. , Disp: , Rfl:  ?  b complex vitamins tablet, Take 1 tablet by mouth daily. , Disp: , Rfl:  ?  Dulaglutide 1.5 MG/0.5ML SOPN, Inject 1.5 mg into the skin every Saturday., Disp: , Rfl:  ?  empagliflozin (JARDIANCE) 25 MG TABS tablet, Take 25 mg by mouth daily., Disp: , Rfl:  ?  gabapentin (NEURONTIN) 100 MG capsule, Take 1 capsule (100 mg total) by mouth 2 (two) times daily., Disp: 60 capsule, Rfl: 6 ?  insulin degludec (TRESIBA) 200 UNIT/ML FlexTouch Pen, 24 Units at bedtime., Disp: , Rfl:  ?  losartan (COZAAR) 50 MG tablet, Take 1 tablet (50 mg total) by mouth daily., Disp: 90 tablet, Rfl: 1 ?  metFORMIN (GLUCOPHAGE) 500 MG tablet, Take 2 tablets (1,000 mg total) by mouth 2 (two) times daily., Disp: 360 tablet, Rfl: 2 ?  metolazone (ZAROXOLYN) 2.5 MG tablet, Take 1 tablet (2.5 mg) by mouth once a week as needed for weight gain greater than 3 lbs, Disp: , Rfl:  ?  metoprolol succinate (TOPROL-XL) 50 MG 24 hr tablet, Take 1 tablet (50 mg total) by mouth daily. Take with or immediately following a meal., Disp: 90 tablet, Rfl: 3 ?  Omega-3 Fatty Acids (FISH OIL) 875 MG CAPS, Take  875 mg by mouth 2 (two) times daily., Disp: , Rfl:  ?  Pancrelipase, Lip-Prot-Amyl, (ZENPEP) 40000-126000 units CPEP, Take 80,000 Units by mouth 3 (three) times daily with meals AND 40,000 Units with snacks., Disp: 600 capsule, Rfl: 1 ?  pramipexole (MIRAPEX) 1.5 MG tablet, Take 1 tablet (1.5 mg total) by mouth 2 (two) times daily., Disp: 180 tablet, Rfl: 3 ?  rosuvastatin (CRESTOR) 20 MG tablet, Take  1 tablet (20 mg total) by mouth daily., Disp: 90 tablet, Rfl: 0 ?  spironolactone (ALDACTONE) 25 MG tablet, Take 1 tablet (25 mg total) by mouth daily., Disp: 90 tablet, Rfl: 1 ?  torsemide (DEMADEX) 20 MG tablet, Take 2 tablets (40 mg total) by mouth daily. 40 mg daily, Disp: 180 tablet, Rfl: 3  ? ?Allergies  ?Allergen Reactions  ? Entresto [Sacubitril-Valsartan] Other (See Comments)  ?  SVT  ? Lipitor [Atorvastatin] Palpitations  ?  SVT  ? Pregabalin Palpitations  ?  SVT  ? ? ?ROS ?Review of Systems  ?Constitutional:  Negative for appetite change.  ?Skin:   ?      Patient has bilateral itching of the skin on both arms, ?Skin is dry on the arm  ? ?  ?Objective:  ?  ?Physical Exam ?Vitals reviewed.  ?Constitutional:   ?   Appearance: Normal appearance.  ?HENT:  ?   Mouth/Throat:  ?   Mouth: Mucous membranes are moist.  ?Eyes:  ?   Pupils: Pupils are equal, round, and reactive to light.  ?Neck:  ?   Vascular: No carotid bruit.  ?Cardiovascular:  ?   Rate and Rhythm: Normal rate and regular rhythm.  ?   Pulses: Normal pulses.  ?   Heart sounds: Normal heart sounds.  ?Pulmonary:  ?   Effort: Pulmonary effort is normal.  ?   Breath sounds: Normal breath sounds.  ?Abdominal:  ?   General: Bowel sounds are normal.  ?   Palpations: Abdomen is soft. There is no hepatomegaly, splenomegaly or mass.  ?   Tenderness: There is no abdominal tenderness.  ?   Hernia: No hernia is present.  ?Musculoskeletal:  ?   Cervical back: Neck supple.  ?   Right lower leg: No edema.  ?   Left lower leg: No edema.  ?Skin: ?   Findings: No rash.   ?Neurological:  ?   Mental Status: He is alert and oriented to person, place, and time.  ?   Motor: No weakness.  ?Psychiatric:     ?   Mood and Affect: Mood normal.     ?   Behavior: Behavior normal.  ? ? ?BP 11

## 2021-12-03 NOTE — Assessment & Plan Note (Signed)

## 2021-12-03 NOTE — Assessment & Plan Note (Signed)
Patient has been scheduled to have a repeat echocardiogram.  He has not tolerated Entresto in the past. ?

## 2021-12-04 ENCOUNTER — Ambulatory Visit (INDEPENDENT_AMBULATORY_CARE_PROVIDER_SITE_OTHER): Payer: 59 | Admitting: Nurse Practitioner

## 2021-12-04 ENCOUNTER — Encounter: Payer: Self-pay | Admitting: Nurse Practitioner

## 2021-12-04 VITALS — BP 115/64 | HR 74 | Ht 68.0 in | Wt 217.1 lb

## 2021-12-04 DIAGNOSIS — L309 Dermatitis, unspecified: Secondary | ICD-10-CM | POA: Diagnosis not present

## 2021-12-04 MED ORDER — CLOTRIMAZOLE-BETAMETHASONE 1-0.05 % EX CREA: 1.0000 "application " | TOPICAL_CREAM | Freq: Two times a day (BID) | CUTANEOUS | 0 refills | Status: DC

## 2021-12-04 NOTE — Assessment & Plan Note (Signed)
Started on Lotrisone cream twice a day. ?

## 2021-12-04 NOTE — Progress Notes (Signed)
? ?Established Patient Office Visit ? ?Subjective:  ?Patient ID: Ricardo Brown, male    DOB: March 03, 1961  Age: 61 y.o. MRN: [id number] ? ?CC:  ?Chief Complaint  ?Patient presents with  ? Rash  ? ? ? ?HPI ? ?Ricardo Brown presents for: ? ?Rash ?This is a recurrent problem. The current episode started in the past 7 days. The problem is unchanged. The affected locations include the left arm and right arm (more on the left arm). The rash is characterized by dryness and itchiness. He was exposed to a new detergent/soap, a new medication and chemicals. Pertinent negatives include no congestion, cough or shortness of breath. Past treatments include anti-itch cream. The treatment provided mild relief.   ? ?Past Medical History:  ?Diagnosis Date  ? Chronic combined systolic (congestive) and diastolic (congestive) heart failure (HCC)   ? a. 01/2018 EF 35-40%; b. 10/2020 Echo: EF 40-45%, glob HK. RVSP 42.31mHg. Mildly dil LA. Mild MS/AoV sclerosis.  ? CKD (chronic kidney disease), stage [place]   ? Coronary artery disease   ? a. 01/2018 late presenting inferior MI; b. 01/2018 Cath: Severe multivessel dzs-->CABG x 3 (LIMA->LAD, VG->OM2, VG->RPDA @ [facility]).  ? Diabetes mellitus without complication ([place]   ? a. Dx ~ 2000.  ? Heart palpitations   ? a. Pt reports prior nl echo's and stress tests. Last stress test w/in past 2 yrs - PCP.  ? High cholesterol   ? Hypertension   ? Mitral regurgitation   ? a. 01/2018 s/p MV repair @ time of CABG.  ? Myocardial infarction ([facility]   ? Postoperative atrial fibrillation   ? a. 01/2018 @ time of CABG.  ? PSVT (paroxysmal supraventricular tachycardia) ([place]   ? a. Very symptomatic with multiple ED evaluations.  Has been on amio.  ? STEMI (ST elevation myocardial infarction) ([place] 01/24/2018  ? ? ?Past Surgical History:  ?Procedure Laterality Date  ? AMPUTATION TOE Right 08/29/2021  ? Procedure: AMPUTATION TOE-2nd toe;  Surgeon: CSharlotte Alamo DPM;  Location: [facility] ORS;  Service:  Podiatry;  Laterality: Right;  ? CARDIAC CATHETERIZATION    ? CARDIAC VALVE REPLACEMENT    ? Mitral Valve Repair  ? COLONOSCOPY WITH PROPOFOL N/A 06/07/2019  ? Procedure: COLONOSCOPY WITH PROPOFOL;  Surgeon: TVirgel Manifold MD;  Location: [facility] ENDOSCOPY;  Service: Endoscopy;  Laterality: N/A;  ? COLONOSCOPY WITH PROPOFOL N/A 02/07/2020  ? Procedure: COLONOSCOPY WITH PROPOFOL;  Surgeon: TVirgel Manifold MD;  Location: [facility] ENDOSCOPY;  Service: Endoscopy;  Laterality: N/A;  ? COLONOSCOPY WITH PROPOFOL N/A 12/11/2020  ? Procedure: COLONOSCOPY WITH PROPOFOL;  Surgeon: TVirgel Manifold MD;  Location: [facility] ENDOSCOPY;  Service: Endoscopy;  Laterality: N/A;  ? CORONARY ANGIOPLASTY    ? CORONARY ARTERY BYPASS GRAFT    ? x 3   01/2018  ? ESOPHAGOGASTRODUODENOSCOPY N/A 02/07/2020  ? Procedure: ESOPHAGOGASTRODUODENOSCOPY (EGD);  Surgeon: TVirgel Manifold MD;  [facility];  Service: Endoscopy;  Laterality: N/A;  ? LEFT HEART CATH AND CORONARY ANGIOGRAPHY N/A 01/24/2018  ? Procedure: LEFT HEART CATH AND CORONARY ANGIOGRAPHY;  Surgeon: AWellington Hampshire MD;  [place];  Service: Cardiovascular;  Laterality: N/A;  ? SVT ABLATION N/A 09/12/2021  ? Procedure: SVT ABLATION;  Surgeon: LVickie Epley MD;  [place];  Service: Cardiovascular;  Laterality: N/A;  ? TONSILLECTOMY    ? ? ?Family History  ?Problem Relation Age of Onset  ? Cancer Mother   ?  died @ 68  ? CAD Father   ?     First MI @ 53. S/p heart transplant. Died in mid-50's of cancer.  ? Cancer Father   ? Other Brother   ?     alive and well  ? ? ?Social History  ? ?Socioeconomic History  ? Marital status: Married  ?  Spouse name: Not on file  ? Number of children: Not on file  ? Years of education: Not on file  ? Highest education level: Not on file  ?Occupational History  ? Not on file  ?Tobacco Use  ? Smoking status: Never  ? Smokeless tobacco: Never  ?Vaping Use  ? Vaping Use: Never used   ?Substance and Sexual Activity  ? Alcohol use: Not Currently  ?  Comment: rare beer  ? Drug use: No  ? Sexual activity: Not on file  ?Other Topics Concern  ? Not on file  ?Social History Narrative  ? Lives locally with wife.  Works in [employer].  Does not routinely exercise.  ? ?Social Determinants of Health  ? ?Financial Resource Strain: Not on file  ?Food Insecurity: Not on file  ?Transportation Needs: Not on file  ?Physical Activity: Not on file  ?Stress: Not on file  ?Social Connections: Not on file  ?Intimate Partner Violence: Not on file  ? ? ? ?Outpatient Medications Prior to Visit  ?Medication Sig Dispense Refill  ? aspirin 81 MG EC tablet Take 81 mg by mouth daily.     ? b complex vitamins tablet Take 1 tablet by mouth daily.     ? Dulaglutide 1.5 MG/0.5ML SOPN Inject 1.5 mg into the skin every Saturday.    ? empagliflozin (JARDIANCE) 25 MG TABS tablet Take 25 mg by mouth daily.    ? gabapentin (NEURONTIN) 100 MG capsule Take 1 capsule (100 mg total) by mouth 2 (two) times daily. 60 capsule 6  ? insulin degludec (TRESIBA) 200 UNIT/ML FlexTouch Pen 24 Units at bedtime.    ? losartan (COZAAR) 50 MG tablet Take 1 tablet (50 mg total) by mouth daily. 90 tablet 1  ? metFORMIN (GLUCOPHAGE) 500 MG tablet Take 2 tablets (1,000 mg total) by mouth 2 (two) times daily. 360 tablet 2  ? metolazone (ZAROXOLYN) 2.5 MG tablet Take 1 tablet (2.5 mg) by mouth once a week as needed for weight gain greater than 3 lbs    ? metoprolol succinate (TOPROL-XL) 50 MG 24 hr tablet Take 1 tablet (50 mg total) by mouth daily. Take with or immediately following a meal. 90 tablet 3  ? Omega-3 Fatty Acids (FISH OIL) 875 MG CAPS Take 875 mg by mouth 2 (two) times daily.    ? Pancrelipase, Lip-Prot-Amyl, (ZENPEP) 40000-126000 units CPEP Take 80,000 Units by mouth 3 (three) times daily with meals AND 40,000 Units with snacks. 600 capsule 1  ? pramipexole (MIRAPEX) 1.5 MG tablet Take 1 tablet (1.5 mg total) by mouth 2 (two) times daily.  180 tablet 3  ? rosuvastatin (CRESTOR) 20 MG tablet Take 1 tablet (20 mg total) by mouth daily. 90 tablet 0  ? spironolactone (ALDACTONE) 25 MG tablet Take 1 tablet (25 mg total) by mouth daily. 90 tablet 1  ? torsemide (DEMADEX) 20 MG tablet Take 2 tablets (40 mg total) by mouth daily. 40 mg daily 180 tablet 3  ? amiodarone (PACERONE) 200 MG tablet Take 2 tablets (400 mg total) by mouth 2 (two) times daily for 5 days, THEN 2 tablets (400 mg  total) daily for 5 days, THEN 1 tablet (200 mg total) daily. 110 tablet 0  ? ?No facility-administered medications prior to visit.  ? ? ?Allergies  ?Allergen Reactions  ? Entresto [Sacubitril-Valsartan] Other (See Comments)  ?  SVT  ? Lipitor [Atorvastatin] Palpitations  ?  SVT  ? Pregabalin Palpitations  ?  SVT  ? ? ?ROS ?Review of Systems  ?Constitutional:  Negative for activity change and appetite change.  ?HENT:  Negative for congestion, ear discharge and sinus pain.   ?Eyes:  Negative for discharge and redness.  ?Respiratory:  Negative for apnea, cough and shortness of breath.   ?Gastrointestinal:  Negative for abdominal distention and constipation.  ?Endocrine: Negative.   ?Genitourinary:  Negative for difficulty urinating and frequency.  ?Musculoskeletal:  Negative for arthralgias.  ?Skin:  Positive for rash (itching on both left and right arm).  ?Neurological:  Negative for dizziness, facial asymmetry and headaches.  ?Psychiatric/Behavioral:  Negative for agitation, behavioral problems and confusion.   ? ?  ?Objective:  ?  ?Physical Exam ?Constitutional:   ?   Appearance: Normal appearance. He is obese.  ?HENT:  ?   Head: Normocephalic and atraumatic.  ?   Right Ear: Tympanic membrane normal.  ?   Left Ear: Tympanic membrane normal.  ?   Nose: Nose normal. No congestion.  ?Eyes:  ?   Extraocular Movements: Extraocular movements intact.  ?   Conjunctiva/sclera: Conjunctivae normal.  ?   Pupils: Pupils are equal, round, and reactive to light.  ?Cardiovascular:  ?   Rate  and Rhythm: Normal rate and regular rhythm.  ?   Pulses: Normal pulses.  ?   Heart sounds: Normal heart sounds.  ?Pulmonary:  ?   Effort: Pulmonary effort is normal.  ?   Breath sounds: Normal breath sounds.  ?Abdominal:

## 2021-12-07 ENCOUNTER — Other Ambulatory Visit: Payer: Self-pay | Admitting: Cardiology

## 2021-12-08 MED ORDER — ROSUVASTATIN CALCIUM 20 MG PO TABS: 20.0000 mg | ORAL_TABLET | Freq: Every day | ORAL | 0 refills | Status: DC

## 2021-12-17 ENCOUNTER — Ambulatory Visit (INDEPENDENT_AMBULATORY_CARE_PROVIDER_SITE_OTHER): Payer: 59

## 2021-12-17 DIAGNOSIS — I5020 Unspecified systolic (congestive) heart failure: Secondary | ICD-10-CM | POA: Diagnosis not present

## 2021-12-17 LAB — ECHOCARDIOGRAM COMPLETE
AR max vel: 2.83 cm2
AV Area VTI: 2.72 cm2
AV Area mean vel: 2.55 cm2
AV Mean grad: 3 mmHg
AV Peak grad: 5.8 mmHg
Ao pk vel: 1.2 m/s
Area-P 1/2: 3.68 cm2
Calc EF: 38.8 %
MV VTI: 1.75 cm2
S' Lateral: 4.6 cm
Single Plane A2C EF: 33.5 %
Single Plane A4C EF: 42.7 %

## 2021-12-19 ENCOUNTER — Emergency Department: Payer: 59

## 2021-12-19 ENCOUNTER — Encounter: Payer: Self-pay | Admitting: Internal Medicine

## 2021-12-19 ENCOUNTER — Inpatient Hospital Stay
Admission: EM | Admit: 2021-12-19 | Discharge: 2021-12-23 | DRG: 854 | Disposition: A | Discharge status: Home / Self Care | Payer: 59 | Attending: Internal Medicine | Admitting: Internal Medicine

## 2021-12-19 DIAGNOSIS — Z7985 Long-term (current) use of injectable non-insulin antidiabetic drugs: Secondary | ICD-10-CM

## 2021-12-19 DIAGNOSIS — M86172 Other acute osteomyelitis, left ankle and foot: Secondary | ICD-10-CM | POA: Diagnosis present

## 2021-12-19 DIAGNOSIS — I471 Supraventricular tachycardia, unspecified: Secondary | ICD-10-CM | POA: Diagnosis present

## 2021-12-19 DIAGNOSIS — Z7982 Long term (current) use of aspirin: Secondary | ICD-10-CM

## 2021-12-19 DIAGNOSIS — E669 Obesity, unspecified: Secondary | ICD-10-CM | POA: Diagnosis present

## 2021-12-19 DIAGNOSIS — E872 Acidosis, unspecified: Secondary | ICD-10-CM | POA: Diagnosis present

## 2021-12-19 DIAGNOSIS — I2510 Atherosclerotic heart disease of native coronary artery without angina pectoris: Secondary | ICD-10-CM | POA: Diagnosis present

## 2021-12-19 DIAGNOSIS — L97529 Non-pressure chronic ulcer of other part of left foot with unspecified severity: Secondary | ICD-10-CM | POA: Diagnosis present

## 2021-12-19 DIAGNOSIS — I252 Old myocardial infarction: Secondary | ICD-10-CM

## 2021-12-19 DIAGNOSIS — I7025 Atherosclerosis of native arteries of other extremities with ulceration: Secondary | ICD-10-CM | POA: Diagnosis present

## 2021-12-19 DIAGNOSIS — A419 Sepsis, unspecified organism: Principal | ICD-10-CM | POA: Diagnosis present

## 2021-12-19 DIAGNOSIS — Z89421 Acquired absence of other right toe(s): Secondary | ICD-10-CM

## 2021-12-19 DIAGNOSIS — R6520 Severe sepsis without septic shock: Secondary | ICD-10-CM | POA: Diagnosis not present

## 2021-12-19 DIAGNOSIS — L03116 Cellulitis of left lower limb: Secondary | ICD-10-CM | POA: Diagnosis present

## 2021-12-19 DIAGNOSIS — I130 Hypertensive heart and chronic kidney disease with heart failure and stage 1 through stage 4 chronic kidney disease, or unspecified chronic kidney disease: Secondary | ICD-10-CM | POA: Diagnosis present

## 2021-12-19 DIAGNOSIS — L0390 Cellulitis, unspecified: Principal | ICD-10-CM

## 2021-12-19 DIAGNOSIS — N183 Chronic kidney disease, stage 3 unspecified: Secondary | ICD-10-CM | POA: Diagnosis present

## 2021-12-19 DIAGNOSIS — E1152 Type 2 diabetes mellitus with diabetic peripheral angiopathy with gangrene: Secondary | ICD-10-CM | POA: Diagnosis present

## 2021-12-19 DIAGNOSIS — I5042 Chronic combined systolic (congestive) and diastolic (congestive) heart failure: Secondary | ICD-10-CM | POA: Diagnosis present

## 2021-12-19 DIAGNOSIS — I739 Peripheral vascular disease, unspecified: Secondary | ICD-10-CM | POA: Diagnosis present

## 2021-12-19 DIAGNOSIS — E11621 Type 2 diabetes mellitus with foot ulcer: Secondary | ICD-10-CM | POA: Diagnosis present

## 2021-12-19 DIAGNOSIS — Z20822 Contact with and (suspected) exposure to covid-19: Secondary | ICD-10-CM | POA: Diagnosis present

## 2021-12-19 DIAGNOSIS — N1831 Chronic kidney disease, stage 3a: Secondary | ICD-10-CM | POA: Diagnosis present

## 2021-12-19 DIAGNOSIS — E785 Hyperlipidemia, unspecified: Secondary | ICD-10-CM | POA: Diagnosis present

## 2021-12-19 DIAGNOSIS — Z7984 Long term (current) use of oral hypoglycemic drugs: Secondary | ICD-10-CM

## 2021-12-19 DIAGNOSIS — E11628 Type 2 diabetes mellitus with other skin complications: Secondary | ICD-10-CM | POA: Diagnosis present

## 2021-12-19 DIAGNOSIS — I10 Essential (primary) hypertension: Secondary | ICD-10-CM | POA: Diagnosis present

## 2021-12-19 DIAGNOSIS — E1122 Type 2 diabetes mellitus with diabetic chronic kidney disease: Secondary | ICD-10-CM | POA: Diagnosis present

## 2021-12-19 DIAGNOSIS — E7800 Pure hypercholesterolemia, unspecified: Secondary | ICD-10-CM | POA: Diagnosis present

## 2021-12-19 DIAGNOSIS — N179 Acute kidney failure, unspecified: Secondary | ICD-10-CM | POA: Diagnosis present

## 2021-12-19 DIAGNOSIS — N1832 Chronic kidney disease, stage 3b: Secondary | ICD-10-CM | POA: Diagnosis present

## 2021-12-19 DIAGNOSIS — Z951 Presence of aortocoronary bypass graft: Secondary | ICD-10-CM

## 2021-12-19 DIAGNOSIS — E11649 Type 2 diabetes mellitus with hypoglycemia without coma: Secondary | ICD-10-CM | POA: Diagnosis not present

## 2021-12-19 DIAGNOSIS — Z6832 Body mass index (BMI) 32.0-32.9, adult: Secondary | ICD-10-CM

## 2021-12-19 DIAGNOSIS — Z79899 Other long term (current) drug therapy: Secondary | ICD-10-CM

## 2021-12-19 DIAGNOSIS — Z952 Presence of prosthetic heart valve: Secondary | ICD-10-CM

## 2021-12-19 DIAGNOSIS — I4891 Unspecified atrial fibrillation: Secondary | ICD-10-CM | POA: Diagnosis present

## 2021-12-19 DIAGNOSIS — E1169 Type 2 diabetes mellitus with other specified complication: Secondary | ICD-10-CM | POA: Diagnosis present

## 2021-12-19 DIAGNOSIS — Z8249 Family history of ischemic heart disease and other diseases of the circulatory system: Secondary | ICD-10-CM

## 2021-12-19 DIAGNOSIS — Z794 Long term (current) use of insulin: Secondary | ICD-10-CM

## 2021-12-19 DIAGNOSIS — E1142 Type 2 diabetes mellitus with diabetic polyneuropathy: Secondary | ICD-10-CM | POA: Diagnosis present

## 2021-12-19 DIAGNOSIS — E1165 Type 2 diabetes mellitus with hyperglycemia: Secondary | ICD-10-CM | POA: Diagnosis present

## 2021-12-19 DIAGNOSIS — Z888 Allergy status to other drugs, medicaments and biological substances status: Secondary | ICD-10-CM

## 2021-12-19 HISTORY — DX: Sepsis, unspecified organism: A41.9

## 2021-12-19 LAB — RESP PANEL BY RT-PCR (FLU A&B, COVID) ARPGX2
Influenza A by PCR: NEGATIVE
Influenza B by PCR: NEGATIVE
SARS Coronavirus 2 by RT PCR: NEGATIVE

## 2021-12-19 LAB — COMPREHENSIVE METABOLIC PANEL
ALT: 22 U/L (ref 0–44)
AST: 22 U/L (ref 15–41)
Albumin: 4 g/dL (ref 3.5–5.0)
Alkaline Phosphatase: 48 U/L (ref 38–126)
Anion gap: 10 (ref 5–15)
BUN: 40 mg/dL — ABNORMAL HIGH (ref 6–20)
CO2: 25 mmol/L (ref 22–32)
Calcium: 8.9 mg/dL (ref 8.9–10.3)
Chloride: 100 mmol/L (ref 98–111)
Creatinine, Ser: 1.93 mg/dL — ABNORMAL HIGH (ref 0.61–1.24)
GFR, Estimated: 39 mL/min — ABNORMAL LOW (ref >60)
Glucose, Bld: 190 mg/dL — ABNORMAL HIGH (ref 70–99)
Potassium: 4.8 mmol/L (ref 3.5–5.1)
Sodium: 135 mmol/L (ref 135–145)
Total Bilirubin: 0.8 mg/dL (ref 0.3–1.2)
Total Protein: 7.7 g/dL (ref 6.5–8.1)

## 2021-12-19 LAB — CBC WITH DIFFERENTIAL/PLATELET
Abs Immature Granulocytes: 0.26 10*3/uL — ABNORMAL HIGH (ref 0.00–0.07)
Basophils Absolute: 0.1 10*3/uL (ref 0.0–0.1)
Basophils Relative: 0 %
Eosinophils Absolute: 0 10*3/uL (ref 0.0–0.5)
Eosinophils Relative: 0 %
HCT: 38.6 % — ABNORMAL LOW (ref 39.0–52.0)
Hemoglobin: 12.8 g/dL — ABNORMAL LOW (ref 13.0–17.0)
Immature Granulocytes: 1 %
Lymphocytes Relative: 3 %
Lymphs Abs: 0.8 10*3/uL (ref 0.7–4.0)
MCH: 29.9 pg (ref 26.0–34.0)
MCHC: 33.2 g/dL (ref 30.0–36.0)
MCV: 90.2 fL (ref 80.0–100.0)
Monocytes Absolute: 1 10*3/uL (ref 0.1–1.0)
Monocytes Relative: 4 %
Neutro Abs: 23.6 10*3/uL — ABNORMAL HIGH (ref 1.7–7.7)
Neutrophils Relative %: 92 %
Platelets: 205 10*3/uL (ref 150–400)
RBC: 4.28 MIL/uL (ref 4.22–5.81)
RDW: 12.7 % (ref 11.5–15.5)
Smear Review: NORMAL
WBC: 25.9 10*3/uL — ABNORMAL HIGH (ref 4.0–10.5)
nRBC: 0 % (ref 0.0–0.2)

## 2021-12-19 LAB — LACTIC ACID, PLASMA
Lactic Acid, Venous: 2.2 mmol/L (ref 0.5–1.9)
Lactic Acid, Venous: 2.2 mmol/L (ref 0.5–1.9)

## 2021-12-19 LAB — BRAIN NATRIURETIC PEPTIDE: B Natriuretic Peptide: 160 pg/mL — ABNORMAL HIGH (ref 0.0–100.0)

## 2021-12-19 LAB — PROCALCITONIN: Procalcitonin: 1.8 ng/mL

## 2021-12-19 LAB — PROTIME-INR
INR: 1.1 (ref 0.8–1.2)
Prothrombin Time: 13.7 seconds (ref 11.4–15.2)

## 2021-12-19 LAB — APTT: aPTT: 31 seconds (ref 24–36)

## 2021-12-19 LAB — GLUCOSE, CAPILLARY: Glucose-Capillary: 182 mg/dL — ABNORMAL HIGH (ref 70–99)

## 2021-12-19 MED ORDER — VANCOMYCIN HCL 2000 MG/400ML IV SOLN
2000.0000 mg | Freq: Once | INTRAVENOUS | Status: AC
Start: 2021-12-19 — End: 2021-12-20
  Administered 2021-12-19: 2000 mg via INTRAVENOUS
  Filled 2021-12-19: qty 400

## 2021-12-19 MED ORDER — ONDANSETRON HCL 4 MG/2ML IJ SOLN: 4.0000 mg | Freq: Four times a day (QID) | INTRAMUSCULAR | Status: DC | PRN

## 2021-12-19 MED ORDER — LACTATED RINGERS IV BOLUS (SEPSIS)
1000.0000 mL | Freq: Once | INTRAVENOUS | Status: AC
  Administered 2021-12-19: 1000 mL via INTRAVENOUS

## 2021-12-19 MED ORDER — SODIUM CHLORIDE 0.9 % IV SOLN
2.0000 g | Freq: Once | INTRAVENOUS | Status: AC
  Administered 2021-12-19: 2 g via INTRAVENOUS
  Filled 2021-12-19: qty 12.5

## 2021-12-19 MED ORDER — ACETAMINOPHEN 500 MG PO TABS
1000.0000 mg | ORAL_TABLET | Freq: Once | ORAL | Status: AC
  Administered 2021-12-19: 1000 mg via ORAL
  Filled 2021-12-19: qty 2

## 2021-12-19 MED ORDER — LACTATED RINGERS IV SOLN: INTRAVENOUS | Status: DC

## 2021-12-19 MED ORDER — METRONIDAZOLE 500 MG/100ML IV SOLN
500.0000 mg | Freq: Once | INTRAVENOUS | Status: AC
  Administered 2021-12-19: 500 mg via INTRAVENOUS
  Filled 2021-12-19: qty 100

## 2021-12-19 MED ORDER — ROSUVASTATIN CALCIUM 10 MG PO TABS
20.0000 mg | ORAL_TABLET | Freq: Every day | ORAL | Status: DC
Start: 2021-12-20 — End: 2021-12-23
  Administered 2021-12-20 – 2021-12-23 (×3): 20 mg via ORAL
  Filled 2021-12-19: qty 1
  Filled 2021-12-19 (×2): qty 2

## 2021-12-19 MED ORDER — VANCOMYCIN HCL IN DEXTROSE 1-5 GM/200ML-% IV SOLN: 1000.0000 mg | Freq: Once | INTRAVENOUS | Status: DC

## 2021-12-19 MED ORDER — ENOXAPARIN SODIUM 60 MG/0.6ML IJ SOSY
0.5000 mg/kg | PREFILLED_SYRINGE | Freq: Every day | INTRAMUSCULAR | Status: DC
Start: 2021-12-19 — End: 2021-12-23
  Administered 2021-12-20 – 2021-12-22 (×3): 50 mg via SUBCUTANEOUS
  Filled 2021-12-19 (×4): qty 0.6

## 2021-12-19 MED ORDER — ACETAMINOPHEN 325 MG PO TABS
650.0000 mg | ORAL_TABLET | Freq: Four times a day (QID) | ORAL | Status: DC | PRN
Start: 2021-12-19 — End: 2021-12-23
  Administered 2021-12-20 – 2021-12-23 (×6): 650 mg via ORAL
  Filled 2021-12-19 (×6): qty 2

## 2021-12-19 MED ORDER — ACETAMINOPHEN 650 MG RE SUPP
650.0000 mg | Freq: Four times a day (QID) | RECTAL | Status: DC | PRN
Start: 2021-12-19 — End: 2021-12-23

## 2021-12-19 MED ORDER — ONDANSETRON HCL 4 MG PO TABS: 4.0000 mg | ORAL_TABLET | Freq: Four times a day (QID) | ORAL | Status: DC | PRN

## 2021-12-19 MED ORDER — METRONIDAZOLE 500 MG/100ML IV SOLN
500.0000 mg | Freq: Two times a day (BID) | INTRAVENOUS | Status: DC
  Filled 2021-12-19: qty 100

## 2021-12-19 MED ORDER — POLYETHYLENE GLYCOL 3350 17 G PO PACK: 17.0000 g | PACK | Freq: Every day | ORAL | Status: DC | PRN

## 2021-12-19 MED ORDER — SODIUM CHLORIDE 0.9 % IV SOLN
2.0000 g | Freq: Two times a day (BID) | INTRAVENOUS | Status: DC
  Administered 2021-12-20 – 2021-12-23 (×7): 2 g via INTRAVENOUS
  Filled 2021-12-19: qty 2
  Filled 2021-12-19 (×6): qty 12.5
  Filled 2021-12-19: qty 2

## 2021-12-19 MED ORDER — GABAPENTIN 100 MG PO CAPS
100.0000 mg | ORAL_CAPSULE | Freq: Two times a day (BID) | ORAL | Status: DC
  Administered 2021-12-20 – 2021-12-23 (×7): 100 mg via ORAL
  Filled 2021-12-19 (×7): qty 1

## 2021-12-19 MED ORDER — METRONIDAZOLE 500 MG/100ML IV SOLN: 500.0000 mg | Freq: Two times a day (BID) | INTRAVENOUS | Status: DC

## 2021-12-19 MED ORDER — ENOXAPARIN SODIUM 40 MG/0.4ML IJ SOSY: 40.0000 mg | PREFILLED_SYRINGE | Freq: Every day | INTRAMUSCULAR | Status: DC

## 2021-12-19 MED ORDER — VANCOMYCIN HCL IN DEXTROSE 1-5 GM/200ML-% IV SOLN: 1000.0000 mg | INTRAVENOUS | Status: DC

## 2021-12-19 NOTE — Assessment & Plan Note (Signed)
-   Continue cefepime and vancomycin - Check MRSA PCR

## 2021-12-19 NOTE — Assessment & Plan Note (Signed)
-   History of postoperative (CABG) atrial fibrillation and June 2019

## 2021-12-19 NOTE — ED Triage Notes (Signed)
Wife reports that she came home today to find the pt having chills and under an electric heated blanket, pt states that he started feeling bad around 1030, pt states he's a little dizzy and off too, pt has swelling with redness and heat to the left lower extremity, pt denies any injury, reports hx of diabetes

## 2021-12-19 NOTE — Progress Notes (Signed)
Pharmacy Antibiotic Note  Ricardo Brown is a 61 y.o. male w/ PMH of CHF, CKD, CAD, DM, HLD, HTN, MI admitted on 12/19/2021 with sepsis.  Pharmacy has been consulted for vancomycin and cefepime dosing. In the ED he received 2000 mg IV vancomycin and 2 grams IV cefepime  Plan:  1) start cefepime 2 grams IV every 12 hours  2) start vancomycin 1000 mg IV Q 24 hrs  Goal AUC 400-550 Expected AUC: 548.1 SCr used: 1.93 mg/dL Ke 0.037 h-1, T1/2 18.7 h   Height: '5\' 8"'$  (172.7 cm) Weight: 98.4 kg (216 lb 14.9 oz) IBW/kg (Calculated) : 68.4  Temp (24hrs), Avg:101.2 F (38.4 C), Min:99.3 F (37.4 C), Max:103 F (39.4 C)  Recent Labs  Lab 12/19/21 1727  WBC 25.9*  CREATININE 1.93*  LATICACIDVEN 2.2*    Estimated Creatinine Clearance: 46.3 mL/min (A) (by C-G formula based on SCr of 1.93 mg/dL (H)).    Allergies  Allergen Reactions   Entresto [Sacubitril-Valsartan] Other (See Comments)    SVT   Lipitor [Atorvastatin] Palpitations    SVT   Pregabalin Palpitations    SVT    Antimicrobials this admission: 05/19 vancomycin >>  05/19 cefepime >>   Microbiology results: 05/19 BCx: pending 05/19 UCx: pending  05/19 MRSA PCR: pending  Thank you for allowing pharmacy to be a part of this patient's care.  Dallie Piles 12/19/2021 8:06 PM

## 2021-12-19 NOTE — Assessment & Plan Note (Addendum)
-   Continue vancomycin, cefepime, metronidazole - Etiology/work-up in progress - Check MRSA PCR, procalcitonin, UA, urine culture - Blood cultures x2 have been collected in our in process - Patient meets severe sepsis criteria due to fever, leukocytosis, possible source of left lower extremity cellulitis with lactic acid elevation at 2.2, organ involvement is renal - Status post 3 L of lactated Ringer's bolus per EDP - Continue LR 150 mL/h - Admit to telemetry cardiac, observation

## 2021-12-19 NOTE — Progress Notes (Signed)
PHARMACIST - PHYSICIAN COMMUNICATION  CONCERNING:  Enoxaparin (Lovenox) for DVT Prophylaxis    RECOMMENDATION: Patient was prescribed enoxaprin '40mg'$  q24 hours for VTE prophylaxis.   Filed Weights   12/19/21 1714  Weight: 98.4 kg (216 lb 14.9 oz)    Body mass index is 32.98 kg/m.  Estimated Creatinine Clearance: 46.3 mL/min (A) (by C-G formula based on SCr of 1.93 mg/dL (H)).   Based on [place] patient is candidate for enoxaparin 0.'5mg'$ /kg TBW SQ every 24 hours based on BMI being >30.  DESCRIPTION: Pharmacy has adjusted enoxaparin dose per [facility] policy.  Patient is now receiving enoxaparin 0.5 mg/kg every 24 hours    Dallie Piles, PharmD Clinical Pharmacist  12/19/2021 8:19 PM

## 2021-12-19 NOTE — Assessment & Plan Note (Addendum)
-   History of MI status post CABG in June 2019 - Resumed aspirin 81 mg daily, rosuvastatin 20 mg daily, metoprolol succinate 50 mg daily

## 2021-12-19 NOTE — Sepsis Progress Note (Signed)
Following per sepsis protocol   

## 2021-12-19 NOTE — Assessment & Plan Note (Signed)
-   On baseline of CKD 3A - Baseline serum creatinine is 1.49/GFR 53

## 2021-12-19 NOTE — Hospital Course (Addendum)
Mr. Ricardo Brown is a 61 year old male with history of poorly controlled insulin-dependent diabetes mellitus, history of MI status post CABG in 2019, hypertension, hyperlipidemia, morbid obesity, PAD, CAD, who presents emergency department for chief concerns of chills and swelling with redness of the left lower extremity.  Vitals in the ED show Tmax of 103, respiration rate of 20, heart rate of 90, blood pressure 110/51, SPO2 of 94% on room air.  Serum sodium is 135, potassium 4.8, chloride of 100, bicarb 25, BUN of 40, serum creatinine 1.93, GFR 39, nonfasting blood glucose 190, WBC elevated at 25.9, hemoglobin 12.8, platelets of 205.  BNP elevated at 160.  Lactic acid is 2.2.  COVID/influenza A/infancy B PCR were negative.  Blood cultures x2 are in process.  ED treatment: Vancomycin, cefepime, metronidazole one-time dose each.  Patient was also given sepsis bolus and started on LR IVF at 150 mL/h.

## 2021-12-19 NOTE — ED Provider Notes (Signed)
[facility] Provider Note    Event Date/Time   First MD Initiated Contact with Patient 12/19/21 1842     (approximate)   History   Fever, Chills, and Leg Swelling   HPI  Ricardo Brown is a 61 y.o. male  who, per endocrinology note from yesterday has history of DM, who presents to the emergency department today with primary concern for chills.  Patient states he woke up this morning and started having chills.  They have been persistent throughout the day.  The patient states he was in his normal state of health yesterday.  He did notice today some left leg erythema and discomfort.  He denies noticing any issues with his leg yesterday.  He denies any chest pain or cough.  Denies any nausea or vomiting.  Denies any urinary symptoms.  Physical Exam   Triage Vital Signs: ED Triage Vitals  Enc Vitals Group     BP 12/19/21 1713 (!) 101/55     Pulse Rate 12/19/21 1713 (!) 106     Resp 12/19/21 1713 18     Temp 12/19/21 1713 (!) 103 F (39.4 C)     Temp Source 12/19/21 1713 Oral     SpO2 12/19/21 1713 94 %     Weight 12/19/21 1714 216 lb 14.9 oz (98.4 kg)     Height 12/19/21 1714 '5\' 8"'$  (1.727 m)     Head Circumference --      Peak Flow --      Pain Score 12/19/21 1713 0   Most recent vital signs: Vitals:   12/19/21 1713 12/19/21 1800  BP: (!) 101/55 (!) 110/51  Pulse: (!) 106 90  Resp: 18 (!) 26  Temp: (!) 103 F (39.4 C)   SpO2: 94% 94%    General: Awake, alert and oriented. CV:  Good peripheral perfusion. Tachycardia. Regular rhythm. Resp:  Normal effort. Lungs clear to auscultation. Abd:  No distention. Non tender. Skin:  Erythema and warmth to left lower leg. Small wound to shin.   ED Results / Procedures / Treatments   Labs (all labs ordered are listed, but only abnormal results are displayed) Labs Reviewed  LACTIC ACID, PLASMA - Abnormal; Notable for the following components:      Result Value   Lactic Acid, Venous 2.2 (*)     All other components within normal limits  COMPREHENSIVE METABOLIC PANEL - Abnormal; Notable for the following components:   Glucose, Bld 190 (*)    BUN 40 (*)    Creatinine, Ser 1.93 (*)    GFR, Estimated 39 (*)    All other components within normal limits  CBC WITH DIFFERENTIAL/PLATELET - Abnormal; Notable for the following components:   WBC 25.9 (*)    Hemoglobin 12.8 (*)    HCT 38.6 (*)    Neutro Abs 23.6 (*)    Abs Immature Granulocytes 0.26 (*)    All other components within normal limits  BRAIN NATRIURETIC PEPTIDE - Abnormal; Notable for the following components:   B Natriuretic Peptide 160.0 (*)    All other components within normal limits  RESP PANEL BY RT-PCR (FLU A&B, COVID) ARPGX2  CULTURE, BLOOD (ROUTINE X 2)  CULTURE, BLOOD (ROUTINE X 2)  PROTIME-INR  APTT  LACTIC ACID, PLASMA  URINALYSIS, COMPLETE (UACMP) WITH MICROSCOPIC     EKG  I, Nance Pear, attending physician, personally viewed and interpreted this EKG  EKG Time: 1736 Rate: 98 Rhythm: normal sinus rhythm Axis: normal Intervals:  qtc 533 QRS: RBBB ST changes: no st elevation Impression: abnormal ekg  RADIOLOGY I independently interpreted and visualized the CXR. My interpretation: Large heart. No pneumonia. No pneumothorax.  Radiology interpretation:  IMPRESSION:  No active disease.      PROCEDURES:  Critical Care performed: Yes, see critical care procedure note(s)  Procedures  CRITICAL CARE Performed by: Nance Pear   Total critical care time: 30 minutes  Critical care time was exclusive of separately billable procedures and treating other patients.  Critical care was necessary to treat or prevent imminent or life-threatening deterioration.  Critical care was time spent personally by me on the following activities: development of treatment plan with patient and/or surrogate as well as nursing, discussions with consultants, evaluation of patient's response to treatment,  examination of patient, obtaining history from patient or surrogate, ordering and performing treatments and interventions, ordering and review of laboratory studies, ordering and review of radiographic studies, pulse oximetry and re-evaluation of patient's condition.   MEDICATIONS ORDERED IN ED: Medications  lactated ringers infusion (has no administration in time range)  metroNIDAZOLE (FLAGYL) IVPB 500 mg (500 mg Intravenous New Bag/Given 12/19/21 1853)  lactated ringers bolus 1,000 mL (0 mLs Intravenous Stopped 12/19/21 1803)    And  lactated ringers bolus 1,000 mL (0 mLs Intravenous Stopped 12/19/21 1843)    And  lactated ringers bolus 1,000 mL (has no administration in time range)  vancomycin (VANCOREADY) IVPB 2000 mg/400 mL (has no administration in time range)  ceFEPIme (MAXIPIME) 2 g in sodium chloride 0.9 % 100 mL IVPB (0 g Intravenous Stopped 12/19/21 1830)  acetaminophen (TYLENOL) tablet 1,000 mg (1,000 mg Oral Given 12/19/21 1827)     IMPRESSION / MDM / ASSESSMENT AND PLAN / ED COURSE  I reviewed the triage vital signs and the nursing notes.                              Differential diagnosis includes, but is not limited to, UTI, PNA, cellulitis.  Patient presents to the emergency department today because of concern for chills. Patient was found to be febrile upon arrival. Physical exam is notable for findings consistent with cellulitis to the left lower leg.  Blood work shows significant leukocytosis and mild lactic acidosis.  Patient was given broad-spectrum antibiotics and IV fluids.  Discussed with Dr. Tobie Poet with the hospitalist service who will plan on admission. FINAL CLINICAL IMPRESSION(S) / ED DIAGNOSES   Final diagnoses:  Cellulitis, unspecified cellulitis site     Note:  This document was prepared using Dragon voice recognition software and may include unintentional dictation errors.    Nance Pear, MD 12/19/21 2010

## 2021-12-19 NOTE — H&P (Signed)
History and Physical   Ricardo Brown INO:[id number] DOB: 04/05/1961 DOA: 12/19/2021  PCP: [name], MD Outpatient Specialists: Dr. Lucilla Lame, [facility] clinic endocrinologist Patient coming from: Home  I have personally briefly reviewed patient's old medical records in [place].  Chief Concern: Chills, left lower extremity swelling and redness  HPI: Ricardo Brown is a 61 year old male with history of poorly controlled insulin-dependent diabetes mellitus, history of MI status post CABG in 2019, hypertension, hyperlipidemia, morbid obesity, PAD, CAD, who presents emergency department for chief concerns of chills and swelling with redness of the left lower extremity.  Vitals in the ED show Tmax of 103, respiration rate of 20, heart rate of 90, blood pressure 110/51, SPO2 of 94% on room air.  Serum sodium is 135, potassium 4.8, chloride of 100, bicarb 25, BUN of 40, serum creatinine 1.93, GFR 39, nonfasting blood glucose 190, WBC elevated at 25.9, hemoglobin 12.8, platelets of 205.  BNP elevated at 160.  Lactic acid is 2.2.  COVID/influenza A/infancy B PCR were negative.  Blood cultures x2 are in process.  ED treatment: Vancomycin, cefepime, metronidazole one-time dose each.  Patient was also given sepsis bolus and started on LR IVF at 150 mL/h.  At bedside, he is awake, alert to self, age, and he knows he is in the hospital.  He states that he woke up this morning and started feeling chills and generalized malaise.  He endorses left lower extremity swelling and redness though he is unsure how this started. He states that he had a fever with Tmax of 103.4 at home.  He endorses 1 episode of nausea and vomiting up his food prior to presentation.  He denies cough, chest pain, shortness of breath, abdominal pain, dysuria, hematuria, diarrhea, syncope or loss of consciousness.  Social history: Lives at home with his wife.  He denies tobacco, EtOH, recreational  drug use.  He is trying to get disability and previously worked in [employer].  ROS: Constitutional: no weight change, + fever ENT/Mouth: no sore throat, no rhinorrhea Eyes: no eye pain, no vision changes Cardiovascular: no chest pain, no dyspnea,  no edema, no palpitations Respiratory: no cough, no sputum, no wheezing Gastrointestinal: + nausea, + vomiting, no diarrhea, no constipation Genitourinary: no urinary incontinence, no dysuria, no hematuria Musculoskeletal: no arthralgias, no myalgias Skin: + skin lesions, no pruritus Neuro: + weakness, no loss of consciousness, no syncope Psych: no anxiety, no depression, + decrease appetite Heme/Lymph: no bruising, no bleeding  ED Course: Discussed with emergency medicine provider, patient requiring hospitalization for chief concerns of sepsis.  Assessment/Plan  Principal Problem:   Severe sepsis with acute organ dysfunction (HCC) Active Problems:   HLD (hyperlipidemia)   Hypertension   S/P CABG x 3   Postoperative atrial fibrillation (HCC)   Coronary artery disease   Chronic combined systolic (congestive) and diastolic (congestive) heart failure (HCC)   PAD (peripheral artery disease) (HCC)   Morbid obesity (HCC)   Cellulitis of left lower extremity   AKI (acute kidney injury) ([place])   Stage 3a chronic kidney disease (CKD) (HCC)   Assessment and Plan:  * Severe sepsis with acute organ dysfunction (HCC) - Continue vancomycin, cefepime, metronidazole - Etiology/work-up in progress - Check MRSA PCR, procalcitonin, UA, urine culture - Blood cultures x2 have been collected in our in process - Patient meets severe sepsis criteria due to fever, leukocytosis, possible source of left lower extremity cellulitis with lactic acid elevation at 2.2, organ involvement is  renal - Status post 3 L of lactated Ringer's bolus per EDP - Continue LR 150 mL/h - Admit to telemetry cardiac, observation  AKI (acute kidney injury) ([place]) - On baseline  of CKD 3A - Baseline serum creatinine is 1.49/GFR 53  Cellulitis of left lower extremity - Continue cefepime and vancomycin - Check MRSA PCR  Morbid obesity (HCC) - Patient's BMI is 32.9, this meets criteria for morbid obesity based on the presence of 1 or more chronic comorbidities. Patient has diabetes and hypertension. This complicates overall care and prognosis.   Postoperative atrial fibrillation (HCC) - History of postoperative (CABG) atrial fibrillation and June 2019  S/P CABG x 3 - History of MI status post CABG in June 2019 - Resumed aspirin 81 mg daily, rosuvastatin 20 mg daily, metoprolol succinate 50 mg daily  Chart reviewed.   Complete echo on 12/17/2021: Left ventricular ejection fraction estimated at 45 to 28%, grade 2 diastolic dysfunction.  DVT prophylaxis: Enoxaparin Code Status: Full code Diet: Heart healthy/carb modified Family Communication: Updated spouse at bedside with patient's permission Disposition Plan: Pending clinical course Consults called: None at this time Admission status: Telemetry cardiac, observation  Past Medical History:  Diagnosis Date   Chronic combined systolic (congestive) and diastolic (congestive) heart failure ([place])    a. 01/2018 EF 35-40%; b. 10/2020 Echo: EF 40-45%, glob HK. RVSP 42.81mHg. Mildly dil LA. Mild MS/AoV sclerosis.   CKD (chronic kidney disease), stage Brown (HCC)    Coronary artery disease    a. 01/2018 late presenting inferior MI; b. 01/2018 Cath: Severe multivessel dzs-->CABG x 3 (LIMA->LAD, VG->OM2, VG->RPDA @ [facility]).   Diabetes mellitus without complication ([place]    a. Dx ~ 2000.   Heart palpitations    a. Pt reports prior nl echo's and stress tests. Last stress test w/in past 2 yrs - PCP.   High cholesterol    Hypertension    Mitral regurgitation    a. 01/2018 s/p MV repair @ time of CABG.   Myocardial infarction ([facility]    Postoperative atrial fibrillation    a. 01/2018 @ time of CABG.   PSVT ([place]oxys[place]  [place]    a. Very symptomatic with multiple ED evaluations.  Has been on amio.   STEMI (ST elevation myocardial infarction) ([place] 01/24/2018   Past Surgical History:  Procedure Laterality Date   AMPUTATION TOE Right 08/29/2021   Procedure: AMPUTATION TOE-2nd toe;  Surgeon: CSharlotte Alamo DPM;  Location: [facility] ORS;  Service: Podiatry;  Laterality: Right;   CARDIAC CATHETERIZATION     CARDIAC VALVE REPLACEMENT     Mitral Valve Repair   COLONOSCOPY WITH PROPOFOL N/A 06/07/2019   Procedure: COLONOSCOPY WITH PROPOFOL;  Surgeon: TVirgel Manifold MD;  Location: [facility] ENDOSCOPY;  Service: Endoscopy;  Laterality: N/A;   COLONOSCOPY WITH PROPOFOL N/A 02/07/2020   Procedure: COLONOSCOPY WITH PROPOFOL;  Surgeon: TVirgel Manifold MD;  Location: [facility] ENDOSCOPY;  Service: Endoscopy;  Laterality: N/A;   COLONOSCOPY WITH PROPOFOL N/A 12/11/2020   Procedure: COLONOSCOPY WITH PROPOFOL;  Surgeon: TVirgel Manifold MD;  Location: [facility] ENDOSCOPY;  Service: Endoscopy;  Laterality: N/A;   CORONARY ANGIOPLASTY     CORONARY ARTERY BYPASS GRAFT     x 3   01/2018   ESOPHAGOGASTRODUODENOSCOPY N/A 02/07/2020   Procedure: ESOPHAGOGASTRODUODENOSCOPY (EGD);  Surgeon: TVirgel Manifold MD;  [facility];  Service: Endoscopy;  Laterality: N/A;   LEFT HEART CATH AND CORONARY ANGIOGRAPHY N/A 01/24/2018   Procedure: LEFT HEART CATH AND CORONARY ANGIOGRAPHY;  Surgeon: [name], MD;  Location: [place] [facility];  Service: Cardiovascular;  Laterality: N/A;   SVT ABLATION N/A 09/12/2021   Procedure: SVT ABLATION;  Surgeon: Vickie Epley, MD;  Location: [place] CV [facility];  Service: Cardiovascular;  Laterality: N/A;   TONSILLECTOMY     Social History:  reports that he has never smoked. He has never used smokeless tobacco. He reports that he does not currently use alcohol. He reports that he does not use drugs.  Allergies  Allergen Reactions   Entresto  [Sacubitril-Valsartan] Other (See Comments)    SVT   Lipitor [Atorvastatin] Palpitations    SVT   Pregabalin Palpitations    SVT   Family History  Problem Relation Age of Onset   Cancer Mother        died @ 75   CAD Father        First MI @ 64. S/p heart transplant. Died in mid-50's of cancer.   Cancer Father    Other Brother        alive and well   Family history: Family history reviewed and not pertinent.  Prior to Admission medications   Medication Sig Start Date End Date Taking? Authorizing Provider  amiodarone (PACERONE) 200 MG tablet Take 2 tablets (400 mg total) by mouth 2 (two) times daily for 5 days, THEN 2 tablets (400 mg total) daily for 5 days, THEN 1 tablet (200 mg total) daily. 09/12/21 10/22/21  Vickie Epley, MD  aspirin 81 MG EC tablet Take 81 mg by mouth daily.     [provider]  b complex vitamins tablet Take 1 tablet by mouth daily.     [provider]  clotrimazole-betamethasone (LOTRISONE) cream Apply 1 application. topically 2 (two) times daily. 12/04/21   Theresia Lo, NP  Dulaglutide 1.5 MG/0.5ML SOPN Inject 1.5 mg into the skin every Saturday.    [provider]  empagliflozin (JARDIANCE) 25 MG TABS tablet Take 25 mg by mouth daily.    [provider]  gabapentin (NEURONTIN) 100 MG capsule Take 1 capsule (100 mg total) by mouth 2 (two) times daily. 11/05/21   [name], MD  insulin degludec (TRESIBA) 200 UNIT/ML FlexTouch Pen 24 Units at bedtime.    [provider]  losartan (COZAAR) 50 MG tablet Take 1 tablet (50 mg total) by mouth daily. 09/19/21   [name], MD  metFORMIN (GLUCOPHAGE) 500 MG tablet Take 2 tablets (1,000 mg total) by mouth 2 (two) times daily. 10/23/21   Theresia Lo, NP  metolazone (ZAROXOLYN) 2.5 MG tablet Take 1 tablet (2.5 mg) by mouth once a week as needed for weight gain greater than 3 lbs 09/05/21   Theora Gianotti, NP  metoprolol succinate (TOPROL-XL) 50 MG  24 hr tablet Take 1 tablet (50 mg total) by mouth daily. Take with or immediately following a meal. 05/27/21   [name], MD  Omega-3 Fatty Acids (FISH OIL) 875 MG CAPS Take 875 mg by mouth 2 (two) times daily.    [provider]  Pancrelipase, Lip-Prot-Amyl, (ZENPEP) 40000-126000 units CPEP Take 80,000 Units by mouth 3 (three) times daily with meals AND 40,000 Units with snacks. 08/08/21   [name], MD  pramipexole (MIRAPEX) 1.5 MG tablet Take 1 tablet (1.5 mg total) by mouth 2 (two) times daily. 05/21/21   [name], MD  rosuvastatin (CRESTOR) 20 MG tablet Take 1 tablet (20 mg total) by mouth daily. 12/08/21 03/08/22  [name], MD  spironolactone (ALDACTONE) 25 MG tablet Take 1 tablet (25 mg total) by mouth daily. 08/25/21   [name], MD  torsemide (DEMADEX) 20 MG tablet Take 2 tablets (40 mg total) by mouth daily. 40 mg daily 07/14/21   [name], MD   Physical Exam: Vitals:   12/19/21 1930 12/19/21 2004 12/19/21 2053 12/19/21 2325  BP: (!) 101/53 (!) 101/53 116/62 119/64  Pulse: 82 79 82 98  Resp: '18 20 18 18  '$ Temp:   99.2 F (37.3 C) (!) 102.5 F (39.2 C)  TempSrc:   Oral   SpO2: 98% 98% 94% 94%  Weight:      Height:       Constitutional: appears age-appropriate, NAD, calm, comfortable Eyes: PERRL, lids and conjunctivae normal ENMT: Mucous membranes are moist. Posterior pharynx clear of any exudate or lesions. Age-appropriate dentition. Hearing appropriate Neck: normal, supple, no masses, no thyromegaly Respiratory: clear to auscultation bilaterally, no wheezing, no crackles. Normal respiratory effort. No accessory muscle use.  Cardiovascular: Regular rate and rhythm, no murmurs / rubs / gallops. No extremity edema. 2+ pedal pulses. No carotid bruits.  Abdomen: no tenderness, no masses palpated, no hepatosplenomegaly. Bowel sounds positive.  Musculoskeletal: no clubbing / cyanosis. No joint deformity upper and lower extremities.  Good ROM, no contractures, no atrophy. Normal muscle tone.  Skin:     Left lower extremity swelling with redness and warmth. Negative for calf pain Neurologic: Sensation intact. Strength 5/5 in all 4.  Psychiatric: Normal judgment and insight. Alert and oriented x 3. Normal mood.   EKG: independently reviewed, showing sinus rhythm with rate of 98, QTc 553, right bundle branch block  Chest x-ray on Admission: I personally reviewed and I agree with radiologist reading as below.  DG Chest Port 1 View  Result Date: 12/19/2021 CLINICAL DATA:  Possible sepsis EXAM: PORTABLE CHEST 1 VIEW COMPARISON:  11/11/2019 FINDINGS: Cardiac shadow is enlarged. Postsurgical changes are again seen and stable. The lungs are clear bilaterally. No acute bony abnormality is noted. IMPRESSION: No active disease. Electronically Signed   By: Inez Catalina M.D.   On: 12/19/2021 18:32    Labs on Admission: I have personally reviewed following labs  CBC: Recent Labs  Lab 12/19/21 1727  WBC 25.9*  NEUTROABS 23.6*  HGB 12.8*  HCT 38.6*  MCV 90.2  PLT 932   Basic Metabolic Panel: Recent Labs  Lab 12/19/21 1727  NA 135  K 4.8  CL 100  CO2 25  GLUCOSE 190*  BUN 40*  CREATININE 1.93*  CALCIUM 8.9   GFR: Estimated Creatinine Clearance: 46.3 mL/min (A) (by C-G formula based on SCr of 1.93 mg/dL (H)).  Liver Function Tests: Recent Labs  Lab 12/19/21 1727  AST 22  ALT 22  ALKPHOS 48  BILITOT 0.8  PROT 7.7  ALBUMIN 4.0   Coagulation Profile: Recent Labs  Lab 12/19/21 1727  INR 1.1   Urine analysis:    Component Value Date/Time   COLORURINE AMBER (A) 01/24/2018 1108   APPEARANCEUR HAZY (A) 01/24/2018 1108   LABSPEC 1.021 01/24/2018 1108   PHURINE 5.0 01/24/2018 1108   GLUCOSEU >=500 (A) 01/24/2018 1108   HGBUR NEGATIVE 01/24/2018 1108   BILIRUBINUR NEGATIVE 01/24/2018 1108   KETONESUR NEGATIVE 01/24/2018 1108   PROTEINUR 30 (A) 01/24/2018 1108   NITRITE NEGATIVE 01/24/2018 1108    LEUKOCYTESUR NEGATIVE 01/24/2018 1108   CRITICAL CARE Performed by: Briant Cedar Monicka Cyran  Total critical care time: 35 minutes  Critical care time was exclusive of  separately billable procedures and treating other patients.  Critical care was necessary to treat or prevent imminent or life-threatening deterioration.  Critical care was time spent personally by me on the following activities: development of treatment plan with patient and/or surrogate as well as nursing, discussions with consultants, evaluation of patient's response to treatment, examination of patient, obtaining history from patient or surrogate, ordering and performing treatments and interventions, ordering and review of laboratory studies, ordering and review of radiographic studies, pulse oximetry and re-evaluation of patient's condition.  Dr. Tobie Poet [facility]  If 7PM-7AM, please contact overnight-coverage provider If 7AM-7PM, please contact day coverage provider [email or web address]  12/20/2021, 12:02 AM

## 2021-12-19 NOTE — ED Notes (Signed)
Second LR bolus slowed down r/t elevated BNP

## 2021-12-19 NOTE — Assessment & Plan Note (Addendum)
-   Patient's BMI is 32.9, this meets criteria for morbid obesity based on the presence of 1 or more chronic comorbidities. Patient has diabetes and hypertension. This complicates overall care and prognosis.

## 2021-12-19 NOTE — ED Provider Triage Note (Signed)
Emergency Medicine Provider Triage Evaluation Note  Ricardo Brown, a 61 y.o. male  was evaluated in triage.  Pt complains of fevers, chills, dizziness, and malaise.  Patient also presents with some significant redness, swelling, and warmth to the left lower extremity, surrounding multiple superficial abrasions.  Patient with history of type 2 diabetes, hypertension, CAD and CKD, presents with sudden onset of symptoms about 1030 this morning.  Review of Systems  Positive: Fevers, chills, malaise, cellulitis Negative: NVD  Physical Exam  BP (!) 101/55 (BP Location: Left Arm)   Pulse (!) 106   Temp (!) 103 F (39.4 C) (Oral)   Resp 18   Ht '5\' 8"'$  (1.727 m)   Wt 98.4 kg   SpO2 94%   BMI 32.98 kg/m  Gen:   Awake, no distress.  Resp:  Normal effort CTA MSK:   Moves extremities without difficulty  SKIN:  Patient is generally sweating with warm skin.  The LLE reveals a superficial abrasion with significant surrounding lower leg erythema, induration, and warmth developing proximally toward the knee  Medical Decision Making  Medically screening exam initiated at 5:24 PM.  Appropriate orders placed.  Ricardo Brown was informed that the remainder of the evaluation will be completed by another provider, this initial triage assessment does not replace that evaluation, and the importance of remaining in the ED until their evaluation is complete.  Patient with above history, presents to the ED with sudden onset of fevers, chills, tachycardia, LLE cellulitis and meeting SIRS criteria.   Melvenia Needles, PA-C 12/19/21 1728

## 2021-12-19 NOTE — Progress Notes (Signed)
PHARMACY -  BRIEF ANTIBIOTIC NOTE   Pharmacy has received consult(s) for vancomycin and cefepime from an ED provider.  The patient's profile has been reviewed for ht/wt/allergies/indication/available labs.    One time order(s) placed for   1) cefepime 2 grams IV x 1  2) vancomycin 2000 mg IV x 1  Further antibiotics/pharmacy consults should be ordered by admitting physician if indicated.                       Thank you, Dallie Piles 12/19/2021  5:29 PM

## 2021-12-20 ENCOUNTER — Inpatient Hospital Stay: Payer: 59

## 2021-12-20 ENCOUNTER — Other Ambulatory Visit: Payer: Self-pay

## 2021-12-20 DIAGNOSIS — E669 Obesity, unspecified: Secondary | ICD-10-CM | POA: Diagnosis present

## 2021-12-20 DIAGNOSIS — E1142 Type 2 diabetes mellitus with diabetic polyneuropathy: Secondary | ICD-10-CM | POA: Diagnosis present

## 2021-12-20 DIAGNOSIS — E1165 Type 2 diabetes mellitus with hyperglycemia: Secondary | ICD-10-CM | POA: Diagnosis present

## 2021-12-20 DIAGNOSIS — E1152 Type 2 diabetes mellitus with diabetic peripheral angiopathy with gangrene: Secondary | ICD-10-CM | POA: Diagnosis present

## 2021-12-20 DIAGNOSIS — I471 Supraventricular tachycardia: Secondary | ICD-10-CM | POA: Diagnosis not present

## 2021-12-20 DIAGNOSIS — E11649 Type 2 diabetes mellitus with hypoglycemia without coma: Secondary | ICD-10-CM | POA: Diagnosis not present

## 2021-12-20 DIAGNOSIS — R6520 Severe sepsis without septic shock: Secondary | ICD-10-CM | POA: Diagnosis present

## 2021-12-20 DIAGNOSIS — Z20822 Contact with and (suspected) exposure to covid-19: Secondary | ICD-10-CM | POA: Diagnosis present

## 2021-12-20 DIAGNOSIS — N179 Acute kidney failure, unspecified: Secondary | ICD-10-CM | POA: Diagnosis present

## 2021-12-20 DIAGNOSIS — E1122 Type 2 diabetes mellitus with diabetic chronic kidney disease: Secondary | ICD-10-CM | POA: Diagnosis present

## 2021-12-20 DIAGNOSIS — M86172 Other acute osteomyelitis, left ankle and foot: Secondary | ICD-10-CM | POA: Diagnosis present

## 2021-12-20 DIAGNOSIS — I5042 Chronic combined systolic (congestive) and diastolic (congestive) heart failure: Secondary | ICD-10-CM | POA: Diagnosis present

## 2021-12-20 DIAGNOSIS — A419 Sepsis, unspecified organism: Secondary | ICD-10-CM | POA: Diagnosis present

## 2021-12-20 DIAGNOSIS — N1832 Chronic kidney disease, stage 3b: Secondary | ICD-10-CM | POA: Diagnosis present

## 2021-12-20 DIAGNOSIS — E1169 Type 2 diabetes mellitus with other specified complication: Secondary | ICD-10-CM | POA: Diagnosis present

## 2021-12-20 DIAGNOSIS — E11621 Type 2 diabetes mellitus with foot ulcer: Secondary | ICD-10-CM | POA: Diagnosis present

## 2021-12-20 DIAGNOSIS — E7800 Pure hypercholesterolemia, unspecified: Secondary | ICD-10-CM | POA: Diagnosis present

## 2021-12-20 DIAGNOSIS — E872 Acidosis, unspecified: Secondary | ICD-10-CM | POA: Diagnosis present

## 2021-12-20 DIAGNOSIS — I130 Hypertensive heart and chronic kidney disease with heart failure and stage 1 through stage 4 chronic kidney disease, or unspecified chronic kidney disease: Secondary | ICD-10-CM | POA: Diagnosis present

## 2021-12-20 DIAGNOSIS — E11628 Type 2 diabetes mellitus with other skin complications: Secondary | ICD-10-CM | POA: Diagnosis present

## 2021-12-20 DIAGNOSIS — L03116 Cellulitis of left lower limb: Secondary | ICD-10-CM | POA: Diagnosis present

## 2021-12-20 DIAGNOSIS — Z952 Presence of prosthetic heart valve: Secondary | ICD-10-CM | POA: Diagnosis not present

## 2021-12-20 DIAGNOSIS — Z79899 Other long term (current) drug therapy: Secondary | ICD-10-CM | POA: Diagnosis not present

## 2021-12-20 LAB — BASIC METABOLIC PANEL
Anion gap: 9 (ref 5–15)
BUN: 42 mg/dL — ABNORMAL HIGH (ref 6–20)
CO2: 25 mmol/L (ref 22–32)
Calcium: 8.2 mg/dL — ABNORMAL LOW (ref 8.9–10.3)
Chloride: 102 mmol/L (ref 98–111)
Creatinine, Ser: 1.8 mg/dL — ABNORMAL HIGH (ref 0.61–1.24)
GFR, Estimated: 43 mL/min — ABNORMAL LOW (ref >60)
Glucose, Bld: 142 mg/dL — ABNORMAL HIGH (ref 70–99)
Potassium: 4.4 mmol/L (ref 3.5–5.1)
Sodium: 136 mmol/L (ref 135–145)

## 2021-12-20 LAB — PROTIME-INR
INR: 1.3 — ABNORMAL HIGH (ref 0.8–1.2)
Prothrombin Time: 15.9 seconds — ABNORMAL HIGH (ref 11.4–15.2)

## 2021-12-20 LAB — CBC
HCT: 31.7 % — ABNORMAL LOW (ref 39.0–52.0)
Hemoglobin: 10.4 g/dL — ABNORMAL LOW (ref 13.0–17.0)
MCH: 29.5 pg (ref 26.0–34.0)
MCHC: 32.8 g/dL (ref 30.0–36.0)
MCV: 89.8 fL (ref 80.0–100.0)
Platelets: 139 10*3/uL — ABNORMAL LOW (ref 150–400)
RBC: 3.53 MIL/uL — ABNORMAL LOW (ref 4.22–5.81)
RDW: 13.2 % (ref 11.5–15.5)
WBC: 19.6 10*3/uL — ABNORMAL HIGH (ref 4.0–10.5)
nRBC: 0 % (ref 0.0–0.2)

## 2021-12-20 LAB — CORTISOL-AM, BLOOD: Cortisol - AM: 23.4 ug/dL — ABNORMAL HIGH (ref 6.7–22.6)

## 2021-12-20 LAB — GLUCOSE, CAPILLARY
Glucose-Capillary: 100 mg/dL — ABNORMAL HIGH (ref 70–99)
Glucose-Capillary: 185 mg/dL — ABNORMAL HIGH (ref 70–99)

## 2021-12-20 MED ORDER — INSULIN GLARGINE-YFGN 100 UNIT/ML ~~LOC~~ SOLN
20.0000 [IU] | Freq: Every day | SUBCUTANEOUS | Status: DC
  Administered 2021-12-20: 20 [IU] via SUBCUTANEOUS
  Filled 2021-12-20: qty 0.2

## 2021-12-20 MED ORDER — OMEGA-3-ACID ETHYL ESTERS 1 G PO CAPS
1000.0000 mg | ORAL_CAPSULE | Freq: Two times a day (BID) | ORAL | Status: DC
  Administered 2021-12-20 – 2021-12-23 (×6): 1000 mg via ORAL
  Filled 2021-12-20 (×6): qty 1

## 2021-12-20 MED ORDER — PANCRELIPASE (LIP-PROT-AMYL) 36000-114000 UNITS PO CPEP: 36000.0000 [IU] | ORAL_CAPSULE | ORAL | Status: DC | PRN

## 2021-12-20 MED ORDER — INSULIN ASPART 100 UNIT/ML IJ SOLN: 0.0000 [IU] | Freq: Every day | INTRAMUSCULAR | Status: DC

## 2021-12-20 MED ORDER — PANCRELIPASE (LIP-PROT-AMYL) 40000-126000 UNITS PO CPEP: 80000.0000 [IU] | ORAL_CAPSULE | ORAL | Status: DC

## 2021-12-20 MED ORDER — INSULIN GLARGINE-YFGN 100 UNIT/ML ~~LOC~~ SOLN
32.0000 [IU] | Freq: Every day | SUBCUTANEOUS | Status: DC
  Administered 2021-12-20 – 2021-12-22 (×3): 32 [IU] via SUBCUTANEOUS
  Filled 2021-12-20 (×4): qty 0.32

## 2021-12-20 MED ORDER — SODIUM CHLORIDE 0.9 % IV SOLN
INTRAVENOUS | Status: DC
Start: 2021-12-20 — End: 2021-12-20

## 2021-12-20 MED ORDER — PANCRELIPASE (LIP-PROT-AMYL) 36000-114000 UNITS PO CPEP
72000.0000 [IU] | ORAL_CAPSULE | Freq: Three times a day (TID) | ORAL | Status: DC
  Administered 2021-12-20 – 2021-12-23 (×5): 72000 [IU] via ORAL
  Filled 2021-12-20 (×13): qty 2

## 2021-12-20 MED ORDER — METOPROLOL SUCCINATE ER 50 MG PO TB24
50.0000 mg | ORAL_TABLET | Freq: Every day | ORAL | Status: DC
  Administered 2021-12-20 – 2021-12-23 (×4): 50 mg via ORAL
  Filled 2021-12-20 (×4): qty 1

## 2021-12-20 MED ORDER — AMIODARONE HCL 200 MG PO TABS
200.0000 mg | ORAL_TABLET | Freq: Every day | ORAL | Status: DC
  Administered 2021-12-20 – 2021-12-23 (×3): 200 mg via ORAL
  Filled 2021-12-20 (×3): qty 1

## 2021-12-20 MED ORDER — ASPIRIN 81 MG PO TBEC
81.0000 mg | DELAYED_RELEASE_TABLET | Freq: Every day | ORAL | Status: DC
  Administered 2021-12-20 – 2021-12-23 (×3): 81 mg via ORAL
  Filled 2021-12-20 (×3): qty 1

## 2021-12-20 MED ORDER — SPIRONOLACTONE 25 MG PO TABS
25.0000 mg | ORAL_TABLET | Freq: Every day | ORAL | Status: DC
  Administered 2021-12-20 – 2021-12-23 (×3): 25 mg via ORAL
  Filled 2021-12-20 (×3): qty 1

## 2021-12-20 MED ORDER — INSULIN ASPART 100 UNIT/ML IJ SOLN
0.0000 [IU] | Freq: Three times a day (TID) | INTRAMUSCULAR | Status: DC
  Administered 2021-12-20 – 2021-12-23 (×3): 1 [IU] via SUBCUTANEOUS
  Filled 2021-12-20 (×4): qty 1

## 2021-12-20 MED ORDER — PRAMIPEXOLE DIHYDROCHLORIDE 1 MG PO TABS
1.5000 mg | ORAL_TABLET | Freq: Two times a day (BID) | ORAL | Status: DC
  Administered 2021-12-20 – 2021-12-23 (×6): 1.5 mg via ORAL
  Filled 2021-12-20 (×7): qty 2

## 2021-12-20 MED ORDER — SODIUM CHLORIDE 0.9 % IV SOLN: Freq: Once | INTRAVENOUS | Status: AC

## 2021-12-20 NOTE — Assessment & Plan Note (Signed)
-   Resumed amiodarone 200 mg daily, metoprolol succinate 50 mg daily

## 2021-12-20 NOTE — Consult Note (Addendum)
PODIATRY / FOOT AND ANKLE SURGERY CONSULTATION NOTE  Requesting Physician: Dr. Posey Pronto  Chief Complaint: Cellulitis left lower extremity, wound left second toe   HPI: Ricardo Brown is a 61 y.o. male who presents with worsening redness and swelling to left lower extremity with fevers.  Patient presented to the emergency room due to worsening of swelling and redness to the left foot as well as feeling generally unwell with fevers.  Patient was subsequently admitted for IV antibiotic therapy and further evaluation of left foot and ankle as well as lower leg.  Patient currently denies nausea, vomiting, fevers, chills but has had this all within the past 24 to 48 hours.  He has noticed blister bleeding to his left second toe and does not know how it occurred.  Patient did not know that he had this area to his foot until issue was removed today.  Patient does have a previous history of amputation of the right second toe that was performed with Dr. Cleda Mccreedy.  Patient has not followed up regularly with Dr. Cleda Mccreedy.  Patient believes that he is starting to improve since his admission.  PMHx:  Past Medical History:  Diagnosis Date   Chronic combined systolic (congestive) and diastolic (congestive) heart failure ([place])    a. 01/2018 EF 35-40%; b. 10/2020 Echo: EF 40-45%, glob HK. RVSP 42.22mHg. Mildly dil LA. Mild MS/AoV sclerosis.   CKD (chronic kidney disease), stage Brown (HCC)    Coronary artery disease    a. 01/2018 late presenting inferior MI; b. 01/2018 Cath: Severe multivessel dzs-->CABG x 3 (LIMA->LAD, VG->OM2, VG->RPDA @ [facility]).   Diabetes mellitus without complication ([place]    a. Dx ~ 2000.   Heart palpitations    a. Pt reports prior nl echo's and stress tests. Last stress test w/in past 2 yrs - PCP.   High cholesterol    Hypertension    Mitral regurgitation    a. 01/2018 s/p MV repair @ time of CABG.   Myocardial infarction ([facility]    Postoperative atrial fibrillation    a. 01/2018 @ time of CABG.    PSVT (paroxysmal supraventricular tachycardia) ([facility] [facility]    a. Very symptomatic with multiple ED evaluations.  Has been on amio.   STEMI (ST elevation myocardial infarction) ([place] 01/24/2018    Surgical Hx:  Past Surgical History:  Procedure Laterality Date   AMPUTATION TOE Right 08/29/2021   Procedure: AMPUTATION TOE-2nd toe;  Surgeon: CSharlotte Alamo DPM;  Location: [facility] ORS;  Service: Podiatry;  Laterality: Right;   CARDIAC CATHETERIZATION     CARDIAC VALVE REPLACEMENT     Mitral Valve Repair   COLONOSCOPY WITH PROPOFOL N/A 06/07/2019   Procedure: COLONOSCOPY WITH PROPOFOL;  Surgeon: TVirgel Manifold MD;  Location: [facility] ENDOSCOPY;  Service: Endoscopy;  Laterality: N/A;   COLONOSCOPY WITH PROPOFOL N/A 02/07/2020   Procedure: COLONOSCOPY WITH PROPOFOL;  Surgeon: TVirgel Manifold MD;  Location: [facility] ENDOSCOPY;  Service: Endoscopy;  Laterality: N/A;   COLONOSCOPY WITH PROPOFOL N/A 12/11/2020   Procedure: COLONOSCOPY WITH PROPOFOL;  Surgeon: TVirgel Manifold MD;  Location: [facility] ENDOSCOPY;  Service: Endoscopy;  Laterality: N/A;   CORONARY ANGIOPLASTY     CORONARY ARTERY BYPASS GRAFT     x 3   01/2018   ESOPHAGOGASTRODUODENOSCOPY N/A 02/07/2020   Procedure: ESOPHAGOGASTRODUODENOSCOPY (EGD);  Surgeon: TVirgel Manifold MD;  [facility];  Service: Endoscopy;  Laterality: N/A;   LEFT HEART CATH AND CORONARY ANGIOGRAPHY N/A 01/24/2018   Procedure: LEFT HEART CATH AND  CORONARY ANGIOGRAPHY;  Surgeon: [name], MD;  Location: [place] CV LAB;  Service: Cardiovascular;  Laterality: N/A;   SVT ABLATION N/A 09/12/2021   Procedure: SVT ABLATION;  Surgeon: Vickie Epley, MD;  Location: [place] [facility];  Service: Cardiovascular;  Laterality: N/A;   TONSILLECTOMY      FHx:  Family History  Problem Relation Age of Onset   Cancer Mother        died @ 20   CAD Father        First MI @ 42. S/p heart transplant. Died in mid-50's of cancer.   Cancer Father     Other Brother        alive and well    Social History:  reports that he has never smoked. He has never used smokeless tobacco. He reports that he does not currently use alcohol. He reports that he does not use drugs.  Allergies:  Allergies  Allergen Reactions   Entresto [Sacubitril-Valsartan] Other (See Comments)    SVT   Lipitor [Atorvastatin] Palpitations    SVT   Pregabalin Palpitations    SVT   Medications Prior to Admission  Medication Sig Dispense Refill   amiodarone (PACERONE) 200 MG tablet Take 2 tablets (400 mg total) by mouth 2 (two) times daily for 5 days, THEN 2 tablets (400 mg total) daily for 5 days, THEN 1 tablet (200 mg total) daily. 110 tablet 0   aspirin 81 MG EC tablet Take 81 mg by mouth daily.      b complex vitamins tablet Take 1 tablet by mouth daily.      clotrimazole-betamethasone (LOTRISONE) cream Apply 1 application. topically 2 (two) times daily. 30 g 0   Dulaglutide 1.5 MG/0.5ML SOPN Inject 1.5 mg into the skin every Saturday.     empagliflozin (JARDIANCE) 25 MG TABS tablet Take 25 mg by mouth daily.     gabapentin (NEURONTIN) 100 MG capsule Take 1 capsule (100 mg total) by mouth 2 (two) times daily. 60 capsule 6   insulin degludec (TRESIBA) 200 UNIT/ML FlexTouch Pen 32 Units at bedtime.     losartan (COZAAR) 50 MG tablet Take 1 tablet (50 mg total) by mouth daily. 90 tablet 1   metFORMIN (GLUCOPHAGE) 500 MG tablet Take 2 tablets (1,000 mg total) by mouth 2 (two) times daily. 360 tablet 2   metolazone (ZAROXOLYN) 2.5 MG tablet Take 1 tablet (2.5 mg) by mouth once a week as needed for weight gain greater than 3 lbs     metoprolol succinate (TOPROL-XL) 50 MG 24 hr tablet Take 1 tablet (50 mg total) by mouth daily. Take with or immediately following a meal. 90 tablet 3   Omega-3 Fatty Acids (FISH OIL) 875 MG CAPS Take 875 mg by mouth 2 (two) times daily.     Pancrelipase, Lip-Prot-Amyl, (ZENPEP) 40000-126000 units CPEP Take 80,000 Units by mouth 3 (three)  times daily with meals AND 40,000 Units with snacks. 600 capsule 1   pramipexole (MIRAPEX) 1.5 MG tablet Take 1 tablet (1.5 mg total) by mouth 2 (two) times daily. 180 tablet 3   rosuvastatin (CRESTOR) 20 MG tablet Take 1 tablet (20 mg total) by mouth daily. 90 tablet 0   spironolactone (ALDACTONE) 25 MG tablet Take 1 tablet (25 mg total) by mouth daily. 90 tablet 1   torsemide (DEMADEX) 20 MG tablet Take 2 tablets (40 mg total) by mouth daily. 40 mg daily 180 tablet 3    Physical Exam: General: Alert and  oriented.  No apparent distress.  Vascular: DP/PT pulses nonpalpable bilateral, no hair growth noted to feet.  Capillary fill time appears to be intact to digits to both feet, slightly delayed to the distal tip of the left second toe likely due to wound that is present.  Neuro: Light touch sensation absent to bilateral lower extremities.  Derm: Appears to have a blisterlike formation noted to the distal aspect the left second toe that involves the toenail, this area was removed along with the toenail and dead skin around the area revealing dermal tissue present.  At the distal tip of the toe there does appear to be a more deep tissue type injury to the area with no evidence of necrosis but appears to go fairly deep to the area but no bone is exposed at this time based on clinical examination.  Associated erythema and edema present to the left second toe that does not extend past the second metatarsal phalangeal joint.     Left lower extremity also appears to have redness and swelling from the ankle to lower calf area.  Some small scabs that are present to the legs but no obvious fluctuance palpable to the left lower extremity.  Increased warmth noted to the left lower extremity compared to the right around the calf area.  Does not appear to be much more swollen on the left side than the right though.    MSK: No pain with bilateral calf squeeze.  No pain with hyperdorsiflexion of the ankle  bilaterally.  Right second toe amputation  Results for orders placed or performed during the hospital encounter of 12/19/21 (from the past 48 hour(s))  Resp Panel by RT-PCR (Flu A&B, Covid) Nasopharyngeal Swab     Status: None   Collection Time: 12/19/21  5:22 PM   Specimen: Nasopharyngeal Swab; Nasopharyngeal(NP) swabs in vial transport medium  Result Value Ref Range   SARS Coronavirus 2 by RT PCR NEGATIVE NEGATIVE    Comment: (NOTE) SARS-CoV-2 target nucleic acids are NOT DETECTED.  The SARS-CoV-2 RNA is generally detectable in upper respiratory specimens during the acute phase of infection. The lowest concentration of SARS-CoV-2 viral copies this assay can detect is 138 copies/mL. A negative result does not preclude SARS-Cov-2 infection and should not be used as the sole basis for treatment or other patient management decisions. A negative result may occur with  improper specimen collection/handling, submission of specimen other than nasopharyngeal swab, presence of viral mutation(s) within the areas targeted by this assay, and inadequate number of viral copies(<138 copies/mL). A negative result must be combined with clinical observations, patient history, and epidemiological information. The expected result is Negative.  Fact Sheet for Patients:  EntrepreneurPulse.com.au  Fact Sheet for Healthcare Providers:  IncredibleEmployment.be  This test is no t yet approved or cleared by the [place] FDA and  has been authorized for detection and/or diagnosis of SARS-CoV-2 by FDA under an Emergency Use Authorization (EUA). This EUA will remain  in effect (meaning this test can be used) for the duration of the COVID-19 declaration under Section 564(b)(1) of the Act, 21 U.S.C.section 360bbb-3(b)(1), unless the authorization is terminated  or revoked sooner.       Influenza A by PCR NEGATIVE NEGATIVE   Influenza B by PCR NEGATIVE NEGATIVE     Comment: (NOTE) The Xpert Xpress SARS-CoV-2/FLU/RSV plus assay is intended as an aid in the diagnosis of influenza from Nasopharyngeal swab specimens and should not be used as a sole basis for treatment. Nasal  washings and aspirates are unacceptable for Xpert Xpress SARS-CoV-2/FLU/RSV testing.  Fact Sheet for Patients: EntrepreneurPulse.com.au  Fact Sheet for Healthcare Providers: IncredibleEmployment.be  This test is not yet approved or cleared by the [place] FDA and has been authorized for detection and/or diagnosis of SARS-CoV-2 by FDA under an Emergency Use Authorization (EUA). This EUA will remain in effect (meaning this test can be used) for the duration of the COVID-19 declaration under Section 564(b)(1) of the Act, 21 U.S.C. section 360bbb-3(b)(1), unless the authorization is terminated or revoked.  Performed at [facility], [address] [place], [place] [address]   Blood Culture (routine x 2)     Status: None (Preliminary result)   Collection Time: 12/19/21  5:22 PM   Specimen: BLOOD  Result Value Ref Range   Specimen Description BLOOD LEFT ANTECUBITAL    Special Requests      BOTTLES DRAWN AEROBIC AND ANAEROBIC Blood Culture adequate volume   Culture      NO GROWTH < 12 HOURS Performed at [facility], [address], [place], [place] [address]    Report Status PENDING   Lactic acid, plasma     Status: Abnormal   Collection Time: 12/19/21  5:27 PM  Result Value Ref Range   Lactic Acid, Venous 2.2 (HH) 0.5 - 1.9 mmol/L    Comment: CRITICAL RESULT CALLED TO, READ BACK BY AND VERIFIED WITH BETHANY SHELTON  AT 17 59 ON 12/19/21 BY SS Performed at [facility], [address] [place],  [address]   Comprehensive metabolic panel     Status: Abnormal   Collection Time: 12/19/21  5:27 PM  Result Value Ref Range   Sodium 135 135 - 145 mmol/L   Potassium 4.8 3.5 - 5.1 mmol/L   Chloride  100 98 - 111 mmol/L   CO2 25 22 - 32 mmol/L   Glucose, Bld 190 (H) 70 - 99 mg/dL    Comment: Glucose reference range applies only to samples taken after fasting for at least 8 hours.   BUN 40 (H) 6 - 20 mg/dL   Creatinine, Ser 1.93 (H) 0.61 - 1.24 mg/dL   Calcium 8.9 8.9 - 10.3 mg/dL   Total Protein 7.7 6.5 - 8.1 g/dL   Albumin 4.0 3.5 - 5.0 g/dL   AST 22 15 - 41 U/L   ALT 22 0 - 44 U/L   Alkaline Phosphatase 48 38 - 126 U/L   Total Bilirubin 0.8 0.3 - 1.2 mg/dL   GFR, Estimated 39 (L) >60 mL/min    Comment: (NOTE) Calculated using the CKD-EPI Creatinine Equation (2021)    Anion gap 10 5 - 15    Comment: Performed at [facility], [address] [place],  [address]  CBC with Differential     Status: Abnormal   Collection Time: 12/19/21  5:27 PM  Result Value Ref Range   WBC 25.9 (H) 4.0 - 10.5 K/uL   RBC 4.28 4.22 - 5.81 MIL/uL   Hemoglobin 12.8 (L) 13.0 - 17.0 g/dL   HCT 38.6 (L) 39.0 - 52.0 %   MCV 90.2 80.0 - 100.0 fL   MCH 29.9 26.0 - 34.0 pg   MCHC 33.2 30.0 - 36.0 g/dL   RDW 12.7 11.5 - 15.5 %   Platelets 205 150 - 400 K/uL   nRBC 0.0 0.0 - 0.2 %   Neutrophils Relative % 92 %   Neutro Abs 23.6 (H) 1.7 - 7.7 K/uL   Lymphocytes Relative  3 %   Lymphs Abs 0.8 0.7 - 4.0 K/uL   Monocytes Relative 4 %   Monocytes Absolute 1.0 0.1 - 1.0 K/uL   Eosinophils Relative 0 %   Eosinophils Absolute 0.0 0.0 - 0.5 K/uL   Basophils Relative 0 %   Basophils Absolute 0.1 0.0 - 0.1 K/uL   WBC Morphology MORPHOLOGY UNREMARKABLE    RBC Morphology MORPHOLOGY UNREMARKABLE    Smear Review Normal platelet morphology    Immature Granulocytes 1 %   Abs Immature Granulocytes 0.26 (H) 0.00 - 0.07 K/uL    Comment: Performed at [facility], [address] [place], [place] [address]  Protime-INR     Status: None   Collection Time: 12/19/21  5:27 PM  Result Value Ref Range   Prothrombin Time 13.7 11.4 - 15.2 seconds   INR 1.1 0.8 - 1.2    Comment: (NOTE) INR goal  varies based on device and disease states. Performed at [facility], [address] [place], [place] [address]   APTT     Status: None   Collection Time: 12/19/21  5:27 PM  Result Value Ref Range   aPTT 31 24 - 36 seconds    Comment: Performed at [facility], [address] [place], [place] [address]  Brain natriuretic peptide     Status: Abnormal   Collection Time: 12/19/21  5:27 PM  Result Value Ref Range   B Natriuretic Peptide 160.0 (H) 0.0 - 100.0 pg/mL    Comment: Performed at [facility], [address] [place], [place] [address]  Blood Culture (routine x 2)     Status: None (Preliminary result)   Collection Time: 12/19/21  6:11 PM   Specimen: BLOOD  Result Value Ref Range   Specimen Description BLOOD RIGHT ANTECUBITAL    Special Requests      BOTTLES DRAWN AEROBIC AND ANAEROBIC Blood Culture results may not be optimal due to an excessive volume of blood received in culture bottles   Culture      NO GROWTH < 12 HOURS Performed at [facility], [address], [place],  [address]    Report Status PENDING   Procalcitonin     Status: None   Collection Time: 12/19/21  8:04 PM  Result Value Ref Range   Procalcitonin 1.80 ng/mL    Comment:        Interpretation: PCT > 0.5 ng/mL and <= 2 ng/mL: Systemic infection (sepsis) is possible, but other conditions are known to elevate PCT as well. (NOTE)       Sepsis PCT Algorithm           Lower Respiratory Tract                                      Infection PCT Algorithm    ----------------------------     ----------------------------         PCT < 0.25 ng/mL                PCT < 0.10 ng/mL          Strongly encourage             Strongly discourage   discontinuation of antibiotics    initiation of antibiotics    ----------------------------     -----------------------------       PCT 0.25 - 0.50 ng/mL  PCT 0.10 - 0.25 ng/mL               OR       >80% decrease in  PCT            Discourage initiation of                                            antibiotics      Encourage discontinuation           of antibiotics    ----------------------------     -----------------------------         PCT >= 0.50 ng/mL              PCT 0.26 - 0.50 ng/mL                AND       <80% decrease in PCT             Encourage initiation of                                             antibiotics       Encourage continuation           of antibiotics    ----------------------------     -----------------------------        PCT >= 0.50 ng/mL                  PCT > 0.50 ng/mL               AND         increase in PCT                  Strongly encourage                                      initiation of antibiotics    Strongly encourage escalation           of antibiotics                                     -----------------------------                                           PCT <= 0.25 ng/mL                                                 OR                                        > 80% decrease in PCT  Discontinue / Do not initiate                                             antibiotics  Performed at [facility], [address] [place], [place] [address]   Lactic acid, plasma     Status: Abnormal   Collection Time: 12/19/21  8:15 PM  Result Value Ref Range   Lactic Acid, Venous 2.2 (HH) 0.5 - 1.9 mmol/L    Comment: CRITICAL VALUE NOTED. VALUE IS CONSISTENT WITH PREVIOUSLY REPORTED/CALLED VALUE DLB Performed at [facility], [address] [place], [place] [address]   Glucose, capillary     Status: Abnormal   Collection Time: 12/19/21  9:34 PM  Result Value Ref Range   Glucose-Capillary 182 (H) 70 - 99 mg/dL    Comment: Glucose reference range applies only to samples taken after fasting for at least 8 hours.  Protime-INR     Status: Abnormal   Collection Time: 12/20/21  5:48 AM  Result Value Ref Range    Prothrombin Time 15.9 (H) 11.4 - 15.2 seconds   INR 1.3 (H) 0.8 - 1.2    Comment: (NOTE) INR goal varies based on device and disease states. Performed at [facility], [address] [place], [place] [address]   Cortisol-am, blood     Status: Abnormal   Collection Time: 12/20/21  5:48 AM  Result Value Ref Range   Cortisol - AM 23.4 (H) 6.7 - 22.6 ug/dL    Comment: Performed at [facility] [address] [place], [place] [address]  Basic metabolic panel     Status: Abnormal   Collection Time: 12/20/21  5:48 AM  Result Value Ref Range   Sodium 136 135 - 145 mmol/L   Potassium 4.4 3.5 - 5.1 mmol/L   Chloride 102 98 - 111 mmol/L   CO2 25 22 - 32 mmol/L   Glucose, Bld 142 (H) 70 - 99 mg/dL    Comment: Glucose reference range applies only to samples taken after fasting for at least 8 hours.   BUN 42 (H) 6 - 20 mg/dL   Creatinine, Ser 1.80 (H) 0.61 - 1.24 mg/dL   Calcium 8.2 (L) 8.9 - 10.3 mg/dL   GFR, Estimated 43 (L) >60 mL/min    Comment: (NOTE) Calculated using the CKD-EPI Creatinine Equation (2021)    Anion gap 9 5 - 15    Comment: Performed at [facility], [address] [place], [place] [address]  CBC     Status: Abnormal   Collection Time: 12/20/21  5:48 AM  Result Value Ref Range   WBC 19.6 (H) 4.0 - 10.5 K/uL   RBC 3.53 (L) 4.22 - 5.81 MIL/uL   Hemoglobin 10.4 (L) 13.0 - 17.0 g/dL   HCT 31.7 (L) 39.0 - 52.0 %   MCV 89.8 80.0 - 100.0 fL   MCH 29.5 26.0 - 34.0 pg   MCHC 32.8 30.0 - 36.0 g/dL   RDW 13.2 11.5 - 15.5 %   Platelets 139 (L) 150 - 400 K/uL   nRBC 0.0 0.0 - 0.2 %    Comment: Performed at [facility], [address], [place],  [address]   DG Chest Port 1 View  Result Date: 12/19/2021 CLINICAL DATA:  Possible sepsis EXAM: PORTABLE CHEST 1 VIEW COMPARISON:  11/11/2019 FINDINGS: Cardiac shadow is  enlarged. Postsurgical changes are again seen and stable. The lungs are clear bilaterally. No acute bony abnormality  is noted. IMPRESSION: No active disease. Electronically Signed   By: Inez Catalina M.D.   On: 12/19/2021 18:32    Blood pressure (!) 113/56, pulse 88, temperature (!) 101.2 F (38.4 C), temperature source Oral, resp. rate (!) 27, height '5\' 8"'$  (1.727 m), weight 98.4 kg, SpO2 94 %.  Assessment Cellulitis left leg and left second toe likely related to left second toe blister/ulcer and scabs to the left lower leg. Diabetes type 2 polyneuropathy PVD with arterial calcinosis  Plan -Patient seen and examined. -Reviewed x-ray imaging.  No evidence of osteomyelitis based on x-ray imaging that was performed the left foot at the second toe. -Wound debridement performed as described below without incident to the left second toe.  Patient tolerated well.  Remove the left second toenail while doing this as well as removed the blister type tissue.  There does not appear to be any obvious bone exposed but concerned that the distal tip of the left second toe has a deep tissue injury with unknown depth of injury to the area. -Betadine wet-to-dry dressing placed.  Recommend changing daily. -Wound culture was taken and to be sent off by staff.  Appreciate medicine recommendations for antibiotic therapy. -Due to extent of infection is present based on labs recommend obtaining a MRI of the left foot without contrast for further assessment of the left second toe to see if there is any evidence of deeper infection to the area.  Does not appear to have any abscesses present based on clinical examination today of the left lower extremity but we will continue to monitor.  Clinically left second toe does not appear to be grossly infected but could have early osteo of the injury is deep enough to the area.  Could necessitate second toe partial amputation.  Will reassess tomorrow -Repeat ABIs ordered as well to assess blood flow further.  Patient did not have a problem healing of previous amputation and previous ABIs were within  normal limits that were taken around a year ago. -Surgical shoe ordered.  Debridment of ulcer: Location: Left second toe distal Pre-debridement measurement: 0 Post-debridement measurement: 1 cm x 0.4 cm x 0.2 cm Tissue removed:   Hyperkeratotic tissue, biofilm, fibrous tissue, toenail Ulcer was debrided 100% excisional sharply with combination of tissue nippers and scalpel blade into the subcutaneous tissue   Caroline More, DPM 12/20/2021, 11:20 AM

## 2021-12-20 NOTE — Progress Notes (Addendum)
[facility] NAME: Ricardo Brown    MR#:  [id number]  DATE OF BIRTH:  1961/03/15  SUBJECTIVE:  family at bedside. Patient comes in with bilateral lower extremity redness more on the left along with left second toe bruise and read. Patient has peripheral neuropathy and if not does not remember any injury to the second toe on the left. Had fever of 102. Currently denies any pain. Had fever of 101 this morning. Receiving IV antibiotics for cellulitis.    VITALS:  Blood pressure (!) 92/54, pulse 73, temperature 98.6 F (37 C), resp. rate 18, height '5\' 8"'$  (1.727 m), weight 98.4 kg, SpO2 95 %.  PHYSICAL EXAMINATION:   GENERAL:  61 y.o.-year-old patient lying in the bed with no acute distress. Obese LUNGS: Normal breath sounds bilaterally, no wheezing, rales, rhonchi.  CARDIOVASCULAR: S1, S2 normal. No murmurs, rubs, or gallops.  ABDOMEN: Soft, nontender, nondistended. Bowel sounds present.  EXTREMITIES      NEUROLOGIC: nonfocal  patient is alert and awake. Patient has  neuropathy.    LABORATORY PANEL:  CBC Recent Labs  Lab 12/20/21 0548  WBC 19.6*  HGB 10.4*  HCT 31.7*  PLT 139*    Chemistries  Recent Labs  Lab 12/19/21 1727 12/20/21 0548  NA 135 136  K 4.8 4.4  CL 100 102  CO2 25 25  GLUCOSE 190* 142*  BUN 40* 42*  CREATININE 1.93* 1.80*  CALCIUM 8.9 8.2*  AST 22  --   ALT 22  --   ALKPHOS 48  --   BILITOT 0.8  --    Cardiac Enzymes No results for input(s): TROPONINI in the last 168 hours. RADIOLOGY:  DG Chest Port 1 View  Result Date: 12/19/2021 CLINICAL DATA:  Possible sepsis EXAM: PORTABLE CHEST 1 VIEW COMPARISON:  11/11/2019 FINDINGS: Cardiac shadow is enlarged. Postsurgical changes are again seen and stable. The lungs are clear bilaterally. No acute bony abnormality is noted. IMPRESSION: No active disease. Electronically Signed   By: Inez Catalina M.D.   On: 12/19/2021 18:32    Assessment and  Plan Ricardo Brown is a 61 year old male with history of poorly controlled insulin-dependent diabetes mellitus, history of MI status post CABG in 2019, hypertension, hyperlipidemia, morbid obesity, PAD, CAD, who presents emergency department for chief concerns of chills and swelling with redness of the left lower extremity.  Severe sepsis due to left leg cellulitis left second toe infection possible osteomyelitis -- came in with leukocytosis, fever of 102, lactic acid 2.2, lower extremity erythema -- continue vancomycin and cefepime -- follow blood culture -- podiatry consultation with Dr. Luana Shu. X-ray for an MRI of the foot.  AKI on chronic kidney disease stage IIIb -- baseline creatinine around 1.5--1.8 -- came in with creatinine of 1.93--- 1.8  type II diabetes uncontrolled with hypoglycemia and CKD -- continue long acting insulin along with sliding scale -- will resume home meds at discharge  CAD status post cabg  x 3 -- continue aspirin, statin, beta-blockers --pt is NSR--d/c tele  H/o SVT --pt is NSR --cont amiodarone, BB--d/c tele for now  HTN --BP soft --holding losartan, torsemide   Procedures: Family communication :wife at bedside Consults :Podiatry CODE STATUS: full DVT Prophylaxis :enoxaparin Level of care: Med-Surg Status is: Inpatient Remains inpatient appropriate because: sepsis due to cellulitis    TOTAL TIME TAKING CARE OF THIS PATIENT: 35 minutes.  >50% time spent on counselling and coordination of care  Note:  This dictation was prepared with Dragon dictation along with smaller phrase technology. Any transcriptional errors that result from this process are unintentional.  [name] M.D    [facility]   CC: Primary care physician; [name], MD

## 2021-12-21 ENCOUNTER — Inpatient Hospital Stay: Payer: 59

## 2021-12-21 DIAGNOSIS — R6520 Severe sepsis without septic shock: Secondary | ICD-10-CM | POA: Diagnosis not present

## 2021-12-21 DIAGNOSIS — A419 Sepsis, unspecified organism: Secondary | ICD-10-CM | POA: Diagnosis not present

## 2021-12-21 LAB — URINALYSIS, COMPLETE (UACMP) WITH MICROSCOPIC
Bacteria, UA: NONE SEEN
Bilirubin Urine: NEGATIVE
Glucose, UA: >=500 mg/dL — AB
Ketones, ur: NEGATIVE mg/dL
Leukocytes,Ua: NEGATIVE
Nitrite: NEGATIVE
Protein, ur: 30 mg/dL — AB
Specific Gravity, Urine: 1.021 (ref 1.005–1.030)
Squamous Epithelial / HPF: NONE SEEN (ref 0–5)
pH: 5 (ref 5.0–8.0)

## 2021-12-21 LAB — TROPONIN I (HIGH SENSITIVITY): Troponin I (High Sensitivity): 15 ng/L (ref <18)

## 2021-12-21 LAB — GLUCOSE, CAPILLARY
Glucose-Capillary: 88 mg/dL (ref 70–99)
Glucose-Capillary: 89 mg/dL (ref 70–99)
Glucose-Capillary: 127 mg/dL — ABNORMAL HIGH (ref 70–99)
Glucose-Capillary: 123 mg/dL — ABNORMAL HIGH (ref 70–99)

## 2021-12-21 LAB — MRSA NEXT GEN BY PCR, NASAL: MRSA by PCR Next Gen: NOT DETECTED

## 2021-12-21 MED ORDER — NITROGLYCERIN 0.4 MG SL SUBL: 0.4000 mg | SUBLINGUAL_TABLET | SUBLINGUAL | Status: DC | PRN

## 2021-12-21 NOTE — Progress Notes (Signed)
       CROSS COVER NOTE  NAME: Ricardo Brown MRN: [id number] DOB : 1961/06/19  Secure chat received from nursing report 3/10 chest pressure with exertion.  On bedside evaluation Ricardo Brown reports he has had chest pressure since this morning. This is not new, he experiences the same symptoms at home but they normally resolve at rest and do not reoccur as frequently as they have today. Endorses dyspnea with exertion. Denies palpitations, nausea, vomiting, dizziness/lightheadedness, or diaphoresis.  Plan: EKG- similar to prior Troponin - negative x2 Nitro PRN  Neomia Glass DNP, MHA, FNP-BC Nurse Practitioner [facility] Pager [phone]

## 2021-12-21 NOTE — Consult Note (Signed)
Reason for Consult: Left second toe infection, concern for PAD Referring Physician: Dr. Neita Goodnight III is an 61 y.o. male.  HPI: This is a very nice 61 year old gentleman with accelerated cardiovascular disease.  He has a history of prior bypass 4 to 5 years ago of the heart.  They harvested his left greater saphenous vein.  Risk factors include CKD 3, hypertension, dyslipidemia, difficult to control diabetes mellitus, obesity, no active history of smoking.  He has dense neuropathy.  He had a right second toe amputation done in January that healed uneventfully.  He had osteomyelitis as an inciting event for this.  He comes today after having temperature to 103 at home, febrile, chills, weakness.  He is noted in the emergency room to have gangrene of the tip of the left second toe.  He has erythema.  He says he feels much better this morning than yesterday.  He got vancomycin, cefepime, Flagyl.  He had an ABI done that demonstrated multiphasic waveforms at the dorsalis pedis level, multiphasic right posterior tibial, slightly dampened left posterior tibial.  I am seeing him in this context.  Past Medical History:  Diagnosis Date   Chronic combined systolic (congestive) and diastolic (congestive) heart failure ([place])    a. 01/2018 EF 35-40%; b. 10/2020 Echo: EF 40-45%, glob HK. RVSP 42.63mHg. Mildly dil LA. Mild MS/AoV sclerosis.   CKD (chronic kidney disease), stage III (HCC)    Coronary artery disease    a. 01/2018 late presenting inferior MI; b. 01/2018 Cath: Severe multivessel dzs-->CABG x 3 (LIMA->LAD, VG->OM2, VG->RPDA @ [facility]).   Diabetes mellitus without complication ([facility]    a. Dx ~ 2000.   Heart palpitations    a. Pt reports prior nl echo's and stress tests. Last stress test w/in past 2 yrs - PCP.   High cholesterol    Hypertension    Mitral regurgitation    a. 01/2018 s/p MV repair @ time of CABG.   Myocardial infarction ([facility]    Postoperative atrial fibrillation    a.  01/2018 @ time of CABG.   PSVT (paroxysmal supraventricular tachycardia) ([facility]    a. Very symptomatic with multiple ED evaluations.  Has been on amio.   STEMI (ST elevation myocardial infarction) ([place] 01/24/2018    Past Surgical History:  Procedure Laterality Date   AMPUTATION TOE Right 08/29/2021   Procedure: AMPUTATION TOE-2nd toe;  Surgeon: CSharlotte Alamo DPM;  Location: [facility] ORS;  Service: Podiatry;  Laterality: Right;   CARDIAC CATHETERIZATION     CARDIAC VALVE REPLACEMENT     Mitral Valve Repair   COLONOSCOPY WITH PROPOFOL N/A 06/07/2019   Procedure: COLONOSCOPY WITH PROPOFOL;  Surgeon: TVirgel Manifold MD;  Location: [facility] ENDOSCOPY;  Service: Endoscopy;  Laterality: N/A;   COLONOSCOPY WITH PROPOFOL N/A 02/07/2020   Procedure: COLONOSCOPY WITH PROPOFOL;  Surgeon: TVirgel Manifold MD;  Location: [facility] ENDOSCOPY;  Service: Endoscopy;  Laterality: N/A;   COLONOSCOPY WITH PROPOFOL N/A 12/11/2020   Procedure: COLONOSCOPY WITH PROPOFOL;  Surgeon: TVirgel Manifold MD;  Location: [facility] ENDOSCOPY;  Service: Endoscopy;  Laterality: N/A;   CORONARY ANGIOPLASTY     CORONARY ARTERY BYPASS GRAFT     x 3   01/2018   ESOPHAGOGASTRODUODENOSCOPY N/A 02/07/2020   Procedure: ESOPHAGOGASTRODUODENOSCOPY (EGD);  Surgeon: TVirgel Manifold MD;  [facility];  Service: Endoscopy;  Laterality: N/A;   LEFT HEART CATH AND CORONARY ANGIOGRAPHY N/A 01/24/2018   Procedure: LEFT HEART CATH AND CORONARY ANGIOGRAPHY;  Surgeon: AFletcher Anon  Mertie Clause, MD;  Location: [facility];  Service: Cardiovascular;  Laterality: N/A;   SVT ABLATION N/A 09/12/2021   Procedure: SVT ABLATION;  Surgeon: Vickie Epley, MD;  Location: [place] [facility];  Service: Cardiovascular;  Laterality: N/A;   TONSILLECTOMY      Family History  Problem Relation Age of Onset   Cancer Mother        died @ 68   CAD Father        First MI @ 87. S/p heart transplant. Died in mid-50's of cancer.   Cancer Father     Other Brother        alive and well    Social History:  reports that he has never smoked. He has never used smokeless tobacco. He reports that he does not currently use alcohol. He reports that he does not use drugs.  Allergies:  Allergies  Allergen Reactions   Entresto [Sacubitril-Valsartan] Other (See Comments)    SVT   Lipitor [Atorvastatin] Palpitations    SVT   Pregabalin Palpitations    SVT    Medications: Prior to Admission:  Medications Prior to Admission  Medication Sig Dispense Refill Last Dose   amiodarone (PACERONE) 200 MG tablet Take 2 tablets (400 mg total) by mouth 2 (two) times daily for 5 days, THEN 2 tablets (400 mg total) daily for 5 days, THEN 1 tablet (200 mg total) daily. 110 tablet 0 12/18/2021   Dulaglutide 1.5 MG/0.5ML SOPN Inject 1.5 mg into the skin every Saturday.   12/13/2021   empagliflozin (JARDIANCE) 25 MG TABS tablet Take 25 mg by mouth daily.   12/18/2021   gabapentin (NEURONTIN) 100 MG capsule Take 1 capsule (100 mg total) by mouth 2 (two) times daily. 60 capsule 6 12/18/2021   insulin degludec (TRESIBA) 200 UNIT/ML FlexTouch Pen 32 Units at bedtime.   12/18/2021   losartan (COZAAR) 50 MG tablet Take 1 tablet (50 mg total) by mouth daily. 90 tablet 1 12/18/2021   metFORMIN (GLUCOPHAGE) 500 MG tablet Take 2 tablets (1,000 mg total) by mouth 2 (two) times daily. 360 tablet 2 12/18/2021   metoprolol succinate (TOPROL-XL) 50 MG 24 hr tablet Take 1 tablet (50 mg total) by mouth daily. Take with or immediately following a meal. 90 tablet 3 12/18/2021   pramipexole (MIRAPEX) 1.5 MG tablet Take 1 tablet (1.5 mg total) by mouth 2 (two) times daily. 180 tablet 3 12/18/2021   rosuvastatin (CRESTOR) 20 MG tablet Take 1 tablet (20 mg total) by mouth daily. 90 tablet 0 12/18/2021   spironolactone (ALDACTONE) 25 MG tablet Take 1 tablet (25 mg total) by mouth daily. 90 tablet 1 12/18/2021   aspirin 81 MG EC tablet Take 81 mg by mouth daily.    12/18/2021   b complex vitamins  tablet Take 1 tablet by mouth daily.    12/18/2021   clotrimazole-betamethasone (LOTRISONE) cream Apply 1 application. topically 2 (two) times daily. 30 g 0 prn   metolazone (ZAROXOLYN) 2.5 MG tablet Take 1 tablet (2.5 mg) by mouth once a week as needed for weight gain greater than 3 lbs   prn   Omega-3 Fatty Acids (FISH OIL) 875 MG CAPS Take 875 mg by mouth 2 (two) times daily.   12/18/2021   Pancrelipase, Lip-Prot-Amyl, (ZENPEP) 40000-126000 units CPEP Take 80,000 Units by mouth 3 (three) times daily with meals AND 40,000 Units with snacks. 600 capsule 1 12/18/2021   torsemide (DEMADEX) 20 MG tablet Take 2 tablets (40 mg  total) by mouth daily. 40 mg daily 180 tablet 3 12/18/2021    Results for orders placed or performed during the hospital encounter of 12/19/21 (from the past 48 hour(s))  Resp Panel by RT-PCR (Flu A&B, Covid) Nasopharyngeal Swab     Status: None   Collection Time: 12/19/21  5:22 PM   Specimen: Nasopharyngeal Swab; Nasopharyngeal(NP) swabs in vial transport medium  Result Value Ref [place]   SARS Coronavirus 2 by RT PCR NEGATIVE NEGATIVE    Comment: (NOTE) SARS-CoV-2 target nucleic acids are NOT DETECTED.  The SARS-CoV-2 RNA is generally detectable in upper respiratory specimens during the acute phase of infection. The lowest concentration of SARS-CoV-2 viral copies this assay can detect is 138 copies/mL. A negative result does not preclude SARS-Cov-2 infection and should not be used as the sole basis for treatment or other patient management decisions. A negative result may occur with  improper specimen collection/handling, submission of specimen other than nasopharyngeal swab, presence of viral mutation(s) within the areas targeted by this assay, and inadequate number of viral copies(<138 copies/mL). A negative result must be combined with clinical observations, patient history, and epidemiological information. The expected result is Negative.  Fact Sheet for Patients:   EntrepreneurPulse.com.au  Fact Sheet for Healthcare Providers:  IncredibleEmployment.be  This test is no t yet approved or cleared by the [place] FDA and  has been authorized for detection and/or diagnosis of SARS-CoV-2 by FDA under an Emergency Use Authorization (EUA). This EUA will remain  in effect (meaning this test can be used) for the duration of the COVID-19 declaration under Section 564(b)(1) of the Act, 21 U.S.C.section 360bbb-3(b)(1), unless the authorization is terminated  or revoked sooner.       Influenza A by PCR NEGATIVE NEGATIVE   Influenza B by PCR NEGATIVE NEGATIVE    Comment: (NOTE) The Xpert Xpress SARS-CoV-2/FLU/RSV plus assay is intended as an aid in the diagnosis of influenza from Nasopharyngeal swab specimens and should not be used as a sole basis for treatment. Nasal washings and aspirates are unacceptable for Xpert Xpress SARS-CoV-2/FLU/RSV testing.  Fact Sheet for Patients: EntrepreneurPulse.com.au  Fact Sheet for Healthcare Providers: IncredibleEmployment.be  This test is not yet approved or cleared by the [place] FDA and has been authorized for detection and/or diagnosis of SARS-CoV-2 by FDA under an Emergency Use Authorization (EUA). This EUA will remain in effect (meaning this test can be used) for the duration of the COVID-19 declaration under Section 564(b)(1) of the Act, 21 U.S.C. section 360bbb-3(b)(1), unless the authorization is terminated or revoked.  Performed at [facility], [address] [place], [place] [address]   Blood Culture (routine x 2)     Status: None (Preliminary result)   Collection Time: 12/19/21  5:22 PM   Specimen: BLOOD  Result Value Ref [place]   Specimen Description BLOOD LEFT ANTECUBITAL    Special Requests      BOTTLES DRAWN AEROBIC AND ANAEROBIC Blood Culture adequate volume   Culture      NO GROWTH 2  DAYS Performed at [facility], [address], [place], [place] [address]    Report Status PENDING   Lactic acid, plasma     Status: Abnormal   Collection Time: 12/19/21  5:27 PM  Result Value Ref [place]   Lactic Acid, Venous 2.2 (HH) 0.5 - 1.9 mmol/L    Comment: CRITICAL RESULT CALLED TO, READ BACK BY AND VERIFIED WITH BETHANY SHELTON  AT 17 59 ON 12/19/21 BY SS Performed  at [facility], [address] [place], [place] [address]   Comprehensive metabolic panel     Status: Abnormal   Collection Time: 12/19/21  5:27 PM  Result Value Ref [place]   Sodium 135 135 - 145 mmol/L   Potassium 4.8 3.5 - 5.1 mmol/L   Chloride 100 98 - 111 mmol/L   CO2 25 22 - 32 mmol/L   Glucose, Bld 190 (H) 70 - 99 mg/dL    Comment: Glucose reference range applies only to samples taken after fasting for at least 8 hours.   BUN 40 (H) 6 - 20 mg/dL   Creatinine, Ser 1.93 (H) 0.61 - 1.24 mg/dL   Calcium 8.9 8.9 - 10.3 mg/dL   Total Protein 7.7 6.5 - 8.1 g/dL   Albumin 4.0 3.5 - 5.0 g/dL   AST 22 15 - 41 U/L   ALT 22 0 - 44 U/L   Alkaline Phosphatase 48 38 - 126 U/L   Total Bilirubin 0.8 0.3 - 1.2 mg/dL   GFR, Estimated 39 (L) >60 mL/min    Comment: (NOTE) Calculated using the CKD-EPI Creatinine Equation (2021)    Anion gap 10 5 - 15    Comment: Performed at [facility], [address] [place], [place] [address]  CBC with Differential     Status: Abnormal   Collection Time: 12/19/21  5:27 PM  Result Value Ref [place]   WBC 25.9 (H) 4.0 - 10.5 K/uL   RBC 4.28 4.22 - 5.81 MIL/uL   Hemoglobin 12.8 (L) 13.0 - 17.0 g/dL   HCT 38.6 (L) 39.0 - 52.0 %   MCV 90.2 80.0 - 100.0 fL   MCH 29.9 26.0 - 34.0 pg   MCHC 33.2 30.0 - 36.0 g/dL   RDW 12.7 11.5 - 15.5 %   Platelets 205 150 - 400 K/uL   nRBC 0.0 0.0 - 0.2 %   Neutrophils Relative % 92 %   Neutro Abs 23.6 (H) 1.7 - 7.7 K/uL   Lymphocytes Relative 3 %   Lymphs Abs 0.8 0.7 - 4.0 K/uL   Monocytes Relative 4 %   Monocytes  Absolute 1.0 0.1 - 1.0 K/uL   Eosinophils Relative 0 %   Eosinophils Absolute 0.0 0.0 - 0.5 K/uL   Basophils Relative 0 %   Basophils Absolute 0.1 0.0 - 0.1 K/uL   WBC Morphology MORPHOLOGY UNREMARKABLE    RBC Morphology MORPHOLOGY UNREMARKABLE    Smear Review Normal platelet morphology    Immature Granulocytes 1 %   Abs Immature Granulocytes 0.26 (H) 0.00 - 0.07 K/uL    Comment: Performed at [facility], [address] [place], [place] [address]  Protime-INR     Status: None   Collection Time: 12/19/21  5:27 PM  Result Value Ref [place]   Prothrombin Time 13.7 11.4 - 15.2 seconds   INR 1.1 0.8 - 1.2    Comment: (NOTE) INR goal varies based on device and disease states. Performed at [facility], [address] [place], [place] [address]   APTT     Status: None   Collection Time: 12/19/21  5:27 PM  Result Value Ref [place]   aPTT 31 24 - 36 seconds    Comment: Performed at [facility], [address] [place],  [address]  Brain natriuretic peptide     Status: Abnormal   Collection Time: 12/19/21  5:27 PM  Result Value Ref [place]   B Natriuretic Peptide 160.0 (H) 0.0 - 100.0  pg/mL    Comment: Performed at [facility], [address] [place], [place] [address]  Blood Culture (routine x 2)     Status: None (Preliminary result)   Collection Time: 12/19/21  6:11 PM   Specimen: BLOOD  Result Value Ref [place]   Specimen Description BLOOD RIGHT ANTECUBITAL    Special Requests      BOTTLES DRAWN AEROBIC AND ANAEROBIC Blood Culture results may not be optimal due to an excessive volume of blood received in culture bottles   Culture      NO GROWTH 2 DAYS Performed at [facility], [address], [place], [place] [address]    Report Status PENDING   Procalcitonin     Status: None   Collection Time: 12/19/21  8:04 PM  Result Value Ref [place]   Procalcitonin 1.80 ng/mL    Comment:        Interpretation: PCT > 0.5 ng/mL and <=  2 ng/mL: Systemic infection (sepsis) is possible, but other conditions are known to elevate PCT as well. (NOTE)       Sepsis PCT Algorithm           Lower Respiratory Tract                                      Infection PCT Algorithm    ----------------------------     ----------------------------         PCT < 0.25 ng/mL                PCT < 0.10 ng/mL          Strongly encourage             Strongly discourage   discontinuation of antibiotics    initiation of antibiotics    ----------------------------     -----------------------------       PCT 0.25 - 0.50 ng/mL            PCT 0.10 - 0.25 ng/mL               OR       >80% decrease in PCT            Discourage initiation of                                            antibiotics      Encourage discontinuation           of antibiotics    ----------------------------     -----------------------------         PCT >= 0.50 ng/mL              PCT 0.26 - 0.50 ng/mL                AND       <80% decrease in PCT             Encourage initiation of                                             antibiotics       Encourage continuation  of antibiotics    ----------------------------     -----------------------------        PCT >= 0.50 ng/mL                  PCT > 0.50 ng/mL               AND         increase in PCT                  Strongly encourage                                      initiation of antibiotics    Strongly encourage escalation           of antibiotics                                     -----------------------------                                           PCT <= 0.25 ng/mL                                                 OR                                        > 80% decrease in PCT                                      Discontinue / Do not initiate                                             antibiotics  Performed at [facility], [address] [place], [place] [address]   Lactic acid, plasma      Status: Abnormal   Collection Time: 12/19/21  8:15 PM  Result Value Ref [place]   Lactic Acid, Venous 2.2 (HH) 0.5 - 1.9 mmol/L    Comment: CRITICAL VALUE NOTED. VALUE IS CONSISTENT WITH PREVIOUSLY REPORTED/CALLED VALUE DLB Performed at [facility], [address] [place], [place] [address]   Glucose, capillary     Status: Abnormal   Collection Time: 12/19/21  9:34 PM  Result Value Ref [place]   Glucose-Capillary 182 (H) 70 - 99 mg/dL    Comment: Glucose reference range applies only to samples taken after fasting for at least 8 hours.  Protime-INR     Status: Abnormal   Collection Time: 12/20/21  5:48 AM  Result Value Ref [place]   Prothrombin Time 15.9 (H) 11.4 - 15.2 seconds   INR 1.3 (H) 0.8 - 1.2    Comment: (NOTE) INR goal varies based on device and disease states. Performed at [facility], [place], [place]  [address]   Cortisol-am, blood     Status: Abnormal   Collection Time: 12/20/21  5:48 AM  Result Value Ref [place]   Cortisol - AM 23.4 (H) 6.7 - 22.6 ug/dL    Comment: Performed at [facility] [address], [place], [place] [address]  Basic metabolic panel     Status: Abnormal   Collection Time: 12/20/21  5:48 AM  Result Value Ref [place]   Sodium 136 135 - 145 mmol/L   Potassium 4.4 3.5 - 5.1 mmol/L   Chloride 102 98 - 111 mmol/L   CO2 25 22 - 32 mmol/L   Glucose, Bld 142 (H) 70 - 99 mg/dL    Comment: Glucose reference range applies only to samples taken after fasting for at least 8 hours.   BUN 42 (H) 6 - 20 mg/dL   Creatinine, Ser 1.80 (H) 0.61 - 1.24 mg/dL   Calcium 8.2 (L) 8.9 - 10.3 mg/dL   GFR, Estimated 43 (L) >60 mL/min    Comment: (NOTE) Calculated using the CKD-EPI Creatinine Equation (2021)    Anion gap 9 5 - 15    Comment: Performed at [facility], [address] [place], [place] [address]  CBC     Status: Abnormal   Collection Time: 12/20/21  5:48 AM  Result Value Ref [place]   WBC 19.6 (H) 4.0 -  10.5 K/uL   RBC 3.53 (L) 4.22 - 5.81 MIL/uL   Hemoglobin 10.4 (L) 13.0 - 17.0 g/dL   HCT 31.7 (L) 39.0 - 52.0 %   MCV 89.8 80.0 - 100.0 fL   MCH 29.5 26.0 - 34.0 pg   MCHC 32.8 30.0 - 36.0 g/dL   RDW 13.2 11.5 - 15.5 %   Platelets 139 (L) 150 - 400 K/uL   nRBC 0.0 0.0 - 0.2 %    Comment: Performed at [facility], [address] [place], [place] [address]  Glucose, capillary     Status: Abnormal   Collection Time: 12/20/21 11:26 AM  Result Value Ref [place]   Glucose-Capillary 100 (H) 70 - 99 mg/dL    Comment: Glucose reference range applies only to samples taken after fasting for at least 8 hours.  Aerobic/Anaerobic Culture w Gram Stain (surgical/deep wound)     Status: None (Preliminary result)   Collection Time: 12/20/21 11:31 AM   Specimen: Toe; Wound  Result Value Ref [place]   Specimen Description      TOE LEFT Performed at [facility], [address] [place], [place] [address]    Special Requests      NONE Performed at [facility], [place], [place] [address]    Gram Stain      NO SQUAMOUS EPITHELIAL CELLS SEEN RARE WBC SEEN RARE GRAM POSITIVE COCCI Performed at [facility], [address], [place], [place] [address]    Culture PENDING    Report Status PENDING   Glucose, capillary     Status: Abnormal   Collection Time: 12/20/21 10:11 PM  Result Value Ref [place]   Glucose-Capillary 185 (H) 70 - 99 mg/dL    Comment: Glucose reference range applies only to samples taken after fasting for at least 8 hours.  Urinalysis, Complete w Microscopic     Status: Abnormal   Collection Time: 12/21/21  4:15 AM  Result Value Ref [place]   Color, Urine YELLOW (A) YELLOW   APPearance CLEAR (A) CLEAR   Specific Gravity, Urine 1.021 1.005 -  1.030   pH 5.0 5.0 - 8.0   Glucose, UA >=500 (A) NEGATIVE mg/dL   Hgb urine dipstick SMALL (A) NEGATIVE   Bilirubin Urine NEGATIVE NEGATIVE   Ketones, ur NEGATIVE NEGATIVE mg/dL   Protein, ur  30 (A) NEGATIVE mg/dL   Nitrite NEGATIVE NEGATIVE   Leukocytes,Ua NEGATIVE NEGATIVE   RBC / HPF 0-5 0 - 5 RBC/hpf   WBC, UA 0-5 0 - 5 WBC/hpf   Bacteria, UA NONE SEEN NONE SEEN   Squamous Epithelial / LPF NONE SEEN 0 - 5    Comment: Performed at [facility], [address] [place], [place] [address]  MRSA Next Gen by PCR, Nasal     Status: None   Collection Time: 12/21/21  4:15 AM   Specimen: Nasal Mucosa; Nasal Swab  Result Value Ref [place]   MRSA by PCR Next Gen NOT DETECTED NOT DETECTED    Comment: (NOTE) The GeneXpert MRSA Assay (FDA approved for NASAL specimens only), is one component of a comprehensive MRSA colonization surveillance program. It is not intended to diagnose MRSA infection nor to guide or monitor treatment for MRSA infections. Test performance is not FDA approved in patients less than 61 years old. Performed at [facility], [address] [place], [place] [address]   Glucose, capillary     Status: None   Collection Time: 12/21/21  7:46 AM  Result Value Ref [place]   Glucose-Capillary 88 70 - 99 mg/dL    Comment: Glucose reference range applies only to samples taken after fasting for at least 8 hours.    US ARTERIAL ABI (SCREENING LOWER EXTREMITY)  Result Date: 12/20/2021 CLINICAL DATA:  Hypertension Left vascular ulcer Hyperlipidemia Diabetes EXAM: NONINVASIVE PHYSIOLOGIC VASCULAR STUDY OF BILATERAL LOWER EXTREMITIES TECHNIQUE: Evaluation of both lower extremities were performed at rest, including calculation of ankle-brachial indices with single level pressure measurements and doppler recording. COMPARISON:  None available FINDINGS: Right ABI:  1.08 Left ABI:  1.00 Right Lower Extremity: Multiphasic waveform of the posterior tibial and dorsalis pedis arteries. Left Lower Extremity: Monophasic waveform of the posterior tibial artery. Multiphasic waveform of the dorsalis pedis artery. 1.0-1.4 Normal IMPRESSION: Ankle brachial indices within  normal limits. Electronically Signed   By: Miachel Roux M.D.   On: 12/20/2021 14:43   DG Chest Port 1 View  Result Date: 12/19/2021 CLINICAL DATA:  Possible sepsis EXAM: PORTABLE CHEST 1 VIEW COMPARISON:  11/11/2019 FINDINGS: Cardiac shadow is enlarged. Postsurgical changes are again seen and stable. The lungs are clear bilaterally. No acute bony abnormality is noted. IMPRESSION: No active disease. Electronically Signed   By: Inez Catalina M.D.   On: 12/19/2021 18:32   DG Foot 2 Views Left  Result Date: 12/20/2021 CLINICAL DATA:  LEFT foot pain and swelling.  No known injury. EXAM: LEFT FOOT - 2 VIEW COMPARISON:  None Available. FINDINGS: No acute fracture or dislocation identified. There is no evidence of acute osteomyelitis noted. Heavy vascular calcifications are present. Equivocal mild soft tissue swelling noted. IMPRESSION: 1. Equivocal mild soft tissue swelling without acute bony abnormality. 2. Heavy vascular calcifications. Electronically Signed   By: Margarette Canada M.D.   On: 12/20/2021 11:57    Review of Systems  Neurological:  Positive for numbness (Dense neuropathy distally both sides).  Blood pressure 117/63, pulse 73, temperature 98.2 F (36.8 C), resp. rate 18, height '5\' 8"'$  (1.727 m), weight 98.4 kg, SpO2 97 %. Physical Exam Vitals and nursing note reviewed.  Constitutional:  General: He is not in acute distress.    Appearance: Normal appearance. He is obese. He is not ill-appearing, toxic-appearing or diaphoretic.  HENT:     Head: Normocephalic.  Cardiovascular:     Rate and Rhythm: Normal rate and regular rhythm.     Pulses:          Popliteal pulses are 2+ on the right side and 1+ on the left side.       Dorsalis pedis pulses are 2+ on the right side and 2+ on the left side.       Posterior tibial pulses are 0 on the right side and 0 on the left side.  Pulmonary:     Effort: Pulmonary effort is normal.  Skin:    General: Skin is warm and dry.     Coloration: Skin is  not jaundiced or pale.     Findings: Erythema (Gangrenous tip of left second toe, erythema along his entire extent, some erythema of the calf approximately) present.     Comments: Absent left toe second nail, poor nail condition of the foot  Neurological:     General: No focal deficit present.     Mental Status: He is alert and oriented to person, place, and time.     Sensory: Sensory deficit (Dense neuropathy) present.    Assessment/Plan: 1.  Poorly controlled diabetes mellitus 2.  AKI on CKD 3 with GFR 39 from 53 3.  Admitted with sepsis state, improved 4.  Hypertension 5.  Dyslipidemia 6.  Coronary artery disease 7.  Likely tibial disease, posterior tibial on the left.  Has multiphasic waveforms on ABI to the pedal level for the most part.  Healed right second toe amputation uneventfully in January.  He may require left second toe amputation, MRI results pending.  I do not know that he necessarily requires arteriogram given the excellent DP pulse present bilaterally, with the second toe being served by the AT angiosome.  Poor healing would likely be diabetic related more so than anything.  I did emphasize with him the importance of risk factor modification including good glycemic control, blood pressure control, lipid control, exercise, diet, etc.  He is on appropriate medication, status post coronary artery bypass for vascular benefit.  Please let me know if you have any questions, I will pass his name along.  Zara Chess 12/21/2021, 8:26 AM

## 2021-12-21 NOTE — Progress Notes (Signed)
PODIATRY / FOOT AND ANKLE SURGERY PROGRESS NOTE  Requesting Physician: Dr. Posey Pronto  Chief Complaint: Cellulitis left lower extremity, wound left second toe   HPI: Ricardo Brown is a 61 y.o. male who presents in chair today resting comfortably.  He has some mild pain the left calf but does think his redness and swelling is improving.  Patient denies n/v/f/c.  PMHx:  Past Medical History:  Diagnosis Date   Chronic combined systolic (congestive) and diastolic (congestive) heart failure ([place])    a. 01/2018 EF 35-40%; b. 10/2020 Echo: EF 40-45%, glob HK. RVSP 42.93mHg. Mildly dil LA. Mild MS/AoV sclerosis.   CKD (chronic kidney disease), stage Brown (HCC)    Coronary artery disease    a. 01/2018 late presenting inferior MI; b. 01/2018 Cath: Severe multivessel dzs-->CABG x 3 (LIMA->LAD, VG->OM2, VG->RPDA @ [facility]).   Diabetes mellitus without complication [place]    a. Dx ~ 2000.   Heart palpitations    a. Pt reports prior nl echo's and stress tests. Last stress test w/in past 2 yrs - PCP.   High cholesterol    Hypertension    Mitral regurgitation    a. 01/2018 s/p MV repair @ time of CABG.   Myocardial infarction ([facility]    Postoperative atrial fibrillation    a. 01/2018 @ time of CABG.   PS[place] ([place]oxysmal [place]ricular [place]cardia) [place]    a. Very symptomatic with multiple ED evaluations.  Has been on amio.   STEMI (ST elevation myocardial infarction) [place] 01/[place]    Surgical Hx:  Past Surgical History:  Procedure Laterality Date   AMPUTATION TOE Right 08/29/2021   Procedure: AMPUTATION TOE-2nd toe;  Surgeon: CSharlotte Alamo DPM;  Location: [facility] ORS;  Service: Podiatry;  Laterality: Right;   CARDIAC CATHETERIZATION     CARDIAC VALVE REPLACEMENT     Mitral Valve Repair   COLONOSCOPY WITH PROPOFOL N/A 06/07/2019   Procedure: COLONOSCOPY WITH PROPOFOL;  Surgeon: TVirgel Manifold MD;  Location: [facility] ENDOSCOPY;  Service: Endoscopy;  Laterality: N/A;   COLONOSCOPY WITH PROPOFOL N/A  02/07/2020   Procedure: COLONOSCOPY WITH PROPOFOL;  Surgeon: TVirgel Manifold MD;  Location: [facility] ENDOSCOPY;  Service: Endoscopy;  Laterality: N/A;   COLONOSCOPY WITH PROPOFOL N/A 12/11/2020   Procedure: COLONOSCOPY WITH PROPOFOL;  Surgeon: TVirgel Manifold MD;  Location: [facility] ENDOSCOPY;  Service: Endoscopy;  Laterality: N/A;   CORONARY ANGIOPLASTY     CORONARY ARTERY BYPASS GRAFT     x 3   01/2018   ESOPHAGOGASTRODUODENOSCOPY N/A 02/07/2020   Procedure: ESOPHAGOGASTRODUODENOSCOPY (EGD);  Surgeon: TVirgel Manifold MD;  [facility];  Service: Endoscopy;  Laterality: N/A;   LEFT HEART CATH AND CORONARY ANGIOGRAPHY N/A 01/24/2018   Procedure: LEFT HEART CATH AND CORONARY ANGIOGRAPHY;  Surgeon: AWellington Hampshire MD;  [facility];  Service: Cardiovascular;  Laterality: N/A;   SVT ABLATION N/A 09/12/2021   Procedure: SVT ABLATION;  Surgeon: LVickie Epley MD;  [facility];  Service: Cardiovascular;  Laterality: N/A;   TONSILLECTOMY      FHx:  Family History  Problem Relation Age of Onset   Cancer Mother        died @ 766  CAD Father        First MI @ 89 S/p heart transplant. Died in mid-50's of cancer.   Cancer Father    Other Brother        alive and well    Social History:  reports  that he has never smoked. He has never used smokeless tobacco. He reports that he does not currently use alcohol. He reports that he does not use drugs.  Allergies:  Allergies  Allergen Reactions   Entresto [Sacubitril-Valsartan] Other (See Comments)    SVT   Lipitor [Atorvastatin] Palpitations    SVT   Pregabalin Palpitations    SVT   Medications Prior to Admission  Medication Sig Dispense Refill   amiodarone (PACERONE) 200 MG tablet Take 2 tablets (400 mg total) by mouth 2 (two) times daily for 5 days, THEN 2 tablets (400 mg total) daily for 5 days, THEN 1 tablet (200 mg total) daily. 110 tablet 0   Dulaglutide 1.5 MG/0.5ML SOPN Inject  1.5 mg into the skin every Saturday.     empagliflozin (JARDIANCE) 25 MG TABS tablet Take 25 mg by mouth daily.     gabapentin (NEURONTIN) 100 MG capsule Take 1 capsule (100 mg total) by mouth 2 (two) times daily. 60 capsule 6   insulin degludec (TRESIBA) 200 UNIT/ML FlexTouch Pen 32 Units at bedtime.     losartan (COZAAR) 50 MG tablet Take 1 tablet (50 mg total) by mouth daily. 90 tablet 1   metFORMIN (GLUCOPHAGE) 500 MG tablet Take 2 tablets (1,000 mg total) by mouth 2 (two) times daily. 360 tablet 2   metoprolol succinate (TOPROL-XL) 50 MG 24 hr tablet Take 1 tablet (50 mg total) by mouth daily. Take with or immediately following a meal. 90 tablet 3   pramipexole (MIRAPEX) 1.5 MG tablet Take 1 tablet (1.5 mg total) by mouth 2 (two) times daily. 180 tablet 3   rosuvastatin (CRESTOR) 20 MG tablet Take 1 tablet (20 mg total) by mouth daily. 90 tablet 0   spironolactone (ALDACTONE) 25 MG tablet Take 1 tablet (25 mg total) by mouth daily. 90 tablet 1   aspirin 81 MG EC tablet Take 81 mg by mouth daily.      b complex vitamins tablet Take 1 tablet by mouth daily.      clotrimazole-betamethasone (LOTRISONE) cream Apply 1 application. topically 2 (two) times daily. 30 g 0   metolazone (ZAROXOLYN) 2.5 MG tablet Take 1 tablet (2.5 mg) by mouth once a week as needed for weight gain greater than 3 lbs     Omega-3 Fatty Acids (FISH OIL) 875 MG CAPS Take 875 mg by mouth 2 (two) times daily.     Pancrelipase, Lip-Prot-Amyl, (ZENPEP) 40000-126000 units CPEP Take 80,000 Units by mouth 3 (three) times daily with meals AND 40,000 Units with snacks. 600 capsule 1   torsemide (DEMADEX) 20 MG tablet Take 2 tablets (40 mg total) by mouth daily. 40 mg daily 180 tablet 3    Physical Exam: General: Alert and oriented.  No apparent distress.  Vascular: DP/PT pulses +1 bilateral, no hair growth noted to feet.  Capillary fill time appears to be intact to digits to both feet, slightly delayed to the distal tip of the  left second toe likely due to wound that is present.  Neuro: Light touch sensation absent to bilateral lower extremities.  Derm: Distal aspect of left 2nd toe today appears to have stable eschar present with no obvious bone exposed.  Upon removal of the eschar there appears to be healthy bleeding but it is very close to bone.  Associated erythema and edema but does appear to be slightly improved.    Left lower extremity also appears to have redness and swelling from the ankle to lower calf area, appears  to be somewhat improved.  Some small scabs that are present to the legs but no obvious fluctuance palpable to the left lower extremity.  Increased warmth noted to the left lower extremity compared to the right around the calf area.  Does not appear to be much more swollen on the left side than the right though.    MSK: No pain with bilateral calf squeeze.  No pain with hyperdorsiflexion of the ankle bilaterally.  Right second toe amputation  Results for orders placed or performed during the hospital encounter of 12/19/21 (from the past 48 hour(s))  Resp Panel by RT-PCR (Flu A&B, Covid) Nasopharyngeal Swab     Status: None   Collection Time: 12/19/21  5:22 PM   Specimen: Nasopharyngeal Swab; Nasopharyngeal(NP) swabs in vial transport medium  Result Value Ref Range   SARS Coronavirus 2 by RT PCR NEGATIVE NEGATIVE    Comment: (NOTE) SARS-CoV-2 target nucleic acids are NOT DETECTED.  The SARS-CoV-2 RNA is generally detectable in upper respiratory specimens during the acute phase of infection. The lowest concentration of SARS-CoV-2 viral copies this assay can detect is 138 copies/mL. A negative result does not preclude SARS-Cov-2 infection and should not be used as the sole basis for treatment or other patient management decisions. A negative result may occur with  improper specimen collection/handling, submission of specimen other than nasopharyngeal swab, presence of viral mutation(s)  within the areas targeted by this assay, and inadequate number of viral copies(<138 copies/mL). A negative result must be combined with clinical observations, patient history, and epidemiological information. The expected result is Negative.  Fact Sheet for Patients:  EntrepreneurPulse.com.au  Fact Sheet for Healthcare Providers:  IncredibleEmployment.be  This test is no t yet approved or cleared by the [place] FDA and  has been authorized for detection and/or diagnosis of SARS-CoV-2 by FDA under an Emergency Use Authorization (EUA). This EUA will remain  in effect (meaning this test can be used) for the duration of the COVID-19 declaration under Section 564(b)(1) of the Act, 21 U.S.C.section 360bbb-3(b)(1), unless the authorization is terminated  or revoked sooner.       Influenza A by PCR NEGATIVE NEGATIVE   Influenza B by PCR NEGATIVE NEGATIVE    Comment: (NOTE) The Xpert Xpress SARS-CoV-2/FLU/RSV plus assay is intended as an aid in the diagnosis of influenza from Nasopharyngeal swab specimens and should not be used as a sole basis for treatment. Nasal washings and aspirates are unacceptable for Xpert Xpress SARS-CoV-2/FLU/RSV testing.  Fact Sheet for Patients: EntrepreneurPulse.com.au  Fact Sheet for Healthcare Providers: IncredibleEmployment.be  This test is not yet approved or cleared by the [place] FDA and has been authorized for detection and/or diagnosis of SARS-CoV-2 by FDA under an Emergency Use Authorization (EUA). This EUA will remain in effect (meaning this test can be used) for the duration of the COVID-19 declaration under Section 564(b)(1) of the Act, 21 U.S.C. section 360bbb-3(b)(1), unless the authorization is terminated or revoked.  Performed at [facility], [address] [place], [place] [address]   Blood Culture (routine x 2)     Status: None  (Preliminary result)   Collection Time: 12/19/21  5:22 PM   Specimen: BLOOD  Result Value Ref Range   Specimen Description BLOOD LEFT ANTECUBITAL    Special Requests      BOTTLES DRAWN AEROBIC AND ANAEROBIC Blood Culture adequate volume   Culture      NO GROWTH 2 DAYS Performed at [facility], [address],  [place], [place] [address]    Report Status PENDING   Lactic acid, plasma     Status: Abnormal   Collection Time: 12/19/21  5:27 PM  Result Value Ref Range   Lactic Acid, Venous 2.2 (HH) 0.5 - 1.9 mmol/L    Comment: CRITICAL RESULT CALLED TO, READ BACK BY AND VERIFIED WITH BETHANY SHELTON  AT 17 59 ON 12/19/21 BY SS Performed at [facility], [address] [place], [place] [address]   Comprehensive metabolic panel     Status: Abnormal   Collection Time: 12/19/21  5:27 PM  Result Value Ref Range   Sodium 135 135 - 145 mmol/L   Potassium 4.8 3.5 - 5.1 mmol/L   Chloride 100 98 - 111 mmol/L   CO2 25 22 - 32 mmol/L   Glucose, Bld 190 (H) 70 - 99 mg/dL    Comment: Glucose reference range applies only to samples taken after fasting for at least 8 hours.   BUN 40 (H) 6 - 20 mg/dL   Creatinine, Ser 1.93 (H) 0.61 - 1.24 mg/dL   Calcium 8.9 8.9 - 10.3 mg/dL   Total Protein 7.7 6.5 - 8.1 g/dL   Albumin 4.0 3.5 - 5.0 g/dL   AST 22 15 - 41 U/L   ALT 22 0 - 44 U/L   Alkaline Phosphatase 48 38 - 126 U/L   Total Bilirubin 0.8 0.3 - 1.2 mg/dL   GFR, Estimated 39 (L) >60 mL/min    Comment: (NOTE) Calculated using the CKD-EPI Creatinine Equation (2021)    Anion gap 10 5 - 15    Comment: Performed at [facility], [address] [place], [place] [address]  CBC with Differential     Status: Abnormal   Collection Time: 12/19/21  5:27 PM  Result Value Ref Range   WBC 25.9 (H) 4.0 - 10.5 K/uL   RBC 4.28 4.22 - 5.81 MIL/uL   Hemoglobin 12.8 (L) 13.0 - 17.0 g/dL   HCT 38.6 (L) 39.0 - 52.0 %   MCV 90.2 80.0 - 100.0 fL   MCH 29.9 26.0 - 34.0 pg   MCHC  33.2 30.0 - 36.0 g/dL   RDW 12.7 11.5 - 15.5 %   Platelets 205 150 - 400 K/uL   nRBC 0.0 0.0 - 0.2 %   Neutrophils Relative % 92 %   Neutro Abs 23.6 (H) 1.7 - 7.7 K/uL   Lymphocytes Relative 3 %   Lymphs Abs 0.8 0.7 - 4.0 K/uL   Monocytes Relative 4 %   Monocytes Absolute 1.0 0.1 - 1.0 K/uL   Eosinophils Relative 0 %   Eosinophils Absolute 0.0 0.0 - 0.5 K/uL   Basophils Relative 0 %   Basophils Absolute 0.1 0.0 - 0.1 K/uL   WBC Morphology MORPHOLOGY UNREMARKABLE    RBC Morphology MORPHOLOGY UNREMARKABLE    Smear Review Normal platelet morphology    Immature Granulocytes 1 %   Abs Immature Granulocytes 0.26 (H) 0.00 - 0.07 K/uL    Comment: Performed at [facility], [address] [place], [place] [address]  Protime-INR     Status: None   Collection Time: 12/19/21  5:27 PM  Result Value Ref Range   Prothrombin Time 13.7 11.4 - 15.2 seconds   INR 1.1 0.8 - 1.2    Comment: (NOTE) INR goal varies based on device and disease states. Performed at [facility], [address] [place], [place] [address]   APTT     Status: None  Collection Time: 12/19/21  5:27 PM  Result Value Ref Range   aPTT 31 24 - 36 seconds    Comment: Performed at [facility], [address] [place], [place] [address]  Brain natriuretic peptide     Status: Abnormal   Collection Time: 12/19/21  5:27 PM  Result Value Ref Range   B Natriuretic Peptide 160.0 (H) 0.0 - 100.0 pg/mL    Comment: Performed at [facility], [address] [place], [place] [address]  Blood Culture (routine x 2)     Status: None (Preliminary result)   Collection Time: 12/19/21  6:11 PM   Specimen: BLOOD  Result Value Ref Range   Specimen Description BLOOD RIGHT ANTECUBITAL    Special Requests      BOTTLES DRAWN AEROBIC AND ANAEROBIC Blood Culture results may not be optimal due to an excessive volume of blood received in culture bottles   Culture      NO GROWTH 2 DAYS Performed at  [facility], [address] [place],  [address]    Report Status PENDING   Procalcitonin     Status: None   Collection Time: 12/19/21  8:04 PM  Result Value Ref Range   Procalcitonin 1.80 ng/mL    Comment:        Interpretation: PCT > 0.5 ng/mL and <= 2 ng/mL: Systemic infection (sepsis) is possible, but other conditions are known to elevate PCT as well. (NOTE)       Sepsis PCT Algorithm           Lower Respiratory Tract                                      Infection PCT Algorithm    ----------------------------     ----------------------------         PCT < 0.25 ng/mL                PCT < 0.10 ng/mL          Strongly encourage             Strongly discourage   discontinuation of antibiotics    initiation of antibiotics    ----------------------------     -----------------------------       PCT 0.25 - 0.50 ng/mL            PCT 0.10 - 0.25 ng/mL               OR       >80% decrease in PCT            Discourage initiation of                                            antibiotics      Encourage discontinuation           of antibiotics    ----------------------------     -----------------------------         PCT >= 0.50 ng/mL              PCT 0.26 - 0.50 ng/mL                AND       <80% decrease in PCT  Encourage initiation of                                             antibiotics       Encourage continuation           of antibiotics    ----------------------------     -----------------------------        PCT >= 0.50 ng/mL                  PCT > 0.50 ng/mL               AND         increase in PCT                  Strongly encourage                                      initiation of antibiotics    Strongly encourage escalation           of antibiotics                                     -----------------------------                                           PCT <= 0.25 ng/mL                                                 OR                                         > 80% decrease in PCT                                      Discontinue / Do not initiate                                             antibiotics  Performed at [facility], [address] [place], [place] [address]   Lactic acid, plasma     Status: Abnormal   Collection Time: 12/19/21  8:15 PM  Result Value Ref Range   Lactic Acid, Venous 2.2 (HH) 0.5 - 1.9 mmol/L    Comment: CRITICAL VALUE NOTED. VALUE IS CONSISTENT WITH PREVIOUSLY REPORTED/CALLED VALUE DLB Performed at [facility], [address] [place], [place] [address]   Glucose, capillary     Status: Abnormal   Collection Time: 12/19/21  9:34 PM  Result Value Ref Range   Glucose-Capillary 182 (H) 70 - 99 mg/dL    Comment: Glucose reference range applies only to samples taken after fasting for at least  8 hours.  Protime-INR     Status: Abnormal   Collection Time: 12/20/21  5:48 AM  Result Value Ref Range   Prothrombin Time 15.9 (H) 11.4 - 15.2 seconds   INR 1.3 (H) 0.8 - 1.2    Comment: (NOTE) INR goal varies based on device and disease states. Performed at [facility], [address] [place], [place] [address]   Cortisol-am, blood     Status: Abnormal   Collection Time: 12/20/21  5:48 AM  Result Value Ref Range   Cortisol - AM 23.4 (H) 6.7 - 22.6 ug/dL    Comment: Performed at [facility] [address], [place], [place] [address]  Basic metabolic panel     Status: Abnormal   Collection Time: 12/20/21  5:48 AM  Result Value Ref Range   Sodium 136 135 - 145 mmol/L   Potassium 4.4 3.5 - 5.1 mmol/L   Chloride 102 98 - 111 mmol/L   CO2 25 22 - 32 mmol/L   Glucose, Bld 142 (H) 70 - 99 mg/dL    Comment: Glucose reference range applies only to samples taken after fasting for at least 8 hours.   BUN 42 (H) 6 - 20 mg/dL   Creatinine, Ser 1.80 (H) 0.61 - 1.24 mg/dL   Calcium 8.2 (L) 8.9 - 10.3 mg/dL   GFR, Estimated 43 (L) >60 mL/min    Comment:  (NOTE) Calculated using the CKD-EPI Creatinine Equation (2021)    Anion gap 9 5 - 15    Comment: Performed at [facility], [address] [place], [place] [address]  CBC     Status: Abnormal   Collection Time: 12/20/21  5:48 AM  Result Value Ref Range   WBC 19.6 (H) 4.0 - 10.5 K/uL   RBC 3.53 (L) 4.22 - 5.81 MIL/uL   Hemoglobin 10.4 (L) 13.0 - 17.0 g/dL   HCT 31.7 (L) 39.0 - 52.0 %   MCV 89.8 80.0 - 100.0 fL   MCH 29.5 26.0 - 34.0 pg   MCHC 32.8 30.0 - 36.0 g/dL   RDW 13.2 11.5 - 15.5 %   Platelets 139 (L) 150 - 400 K/uL   nRBC 0.0 0.0 - 0.2 %    Comment: Performed at [facility], [address] [place], [place] [address]  Glucose, capillary     Status: Abnormal   Collection Time: 12/20/21 11:26 AM  Result Value Ref Range   Glucose-Capillary 100 (H) 70 - 99 mg/dL    Comment: Glucose reference range applies only to samples taken after fasting for at least 8 hours.  Aerobic/Anaerobic Culture w Gram Stain (surgical/deep wound)     Status: None (Preliminary result)   Collection Time: 12/20/21 11:31 AM   Specimen: Toe; Wound  Result Value Ref Range   Specimen Description      TOE LEFT Performed at [facility], [address], [place], [place] [address]    Special Requests      NONE Performed at [facility], [place], [place] [address]    Gram Stain      NO SQUAMOUS EPITHELIAL CELLS SEEN RARE WBC SEEN RARE GRAM POSITIVE COCCI    Culture      TOO YOUNG TO READ Performed at [facility], [place] [address] [place], [place] [address]    Report Status PENDING   Glucose, capillary     Status: Abnormal   Collection Time: 12/20/21 10:11 PM  Result Value  Ref Range   Glucose-Capillary 185 (H) 70 - 99 mg/dL    Comment: Glucose reference range applies only to samples taken after fasting for at least 8 hours.  Urinalysis, Complete w Microscopic     Status: Abnormal   Collection Time: 12/21/21  4:15 AM  Result Value Ref  Range   Color, Urine YELLOW (A) YELLOW   APPearance CLEAR (A) CLEAR   Specific Gravity, Urine 1.021 1.005 - 1.030   pH 5.0 5.0 - 8.0   Glucose, UA >=500 (A) NEGATIVE mg/dL   Hgb urine dipstick SMALL (A) NEGATIVE   Bilirubin Urine NEGATIVE NEGATIVE   Ketones, ur NEGATIVE NEGATIVE mg/dL   Protein, ur 30 (A) NEGATIVE mg/dL   Nitrite NEGATIVE NEGATIVE   Leukocytes,Ua NEGATIVE NEGATIVE   RBC / HPF 0-5 0 - 5 RBC/hpf   WBC, UA 0-5 0 - 5 WBC/hpf   Bacteria, UA NONE SEEN NONE SEEN   Squamous Epithelial / LPF NONE SEEN 0 - 5    Comment: Performed at [facility], [address], [place], [place] [address]  MRSA Next Gen by PCR, Nasal     Status: None   Collection Time: 12/21/21  4:15 AM   Specimen: Nasal Mucosa; Nasal Swab  Result Value Ref Range   MRSA by PCR Next Gen NOT DETECTED NOT DETECTED    Comment: (NOTE) The GeneXpert MRSA Assay (FDA approved for NASAL specimens only), is one component of a comprehensive MRSA colonization surveillance program. It is not intended to diagnose MRSA infection nor to guide or monitor treatment for MRSA infections. Test performance is not FDA approved in patients less than 77 years old. Performed at [facility], [address] [place], [place] [address]   Glucose, capillary     Status: None   Collection Time: 12/21/21  7:46 AM  Result Value Ref Range   Glucose-Capillary 88 70 - 99 mg/dL    Comment: Glucose reference range applies only to samples taken after fasting for at least 8 hours.  Glucose, capillary     Status: None   Collection Time: 12/21/21 11:57 AM  Result Value Ref Range   Glucose-Capillary 89 70 - 99 mg/dL    Comment: Glucose reference range applies only to samples taken after fasting for at least 8 hours.   MR FOOT LEFT WO CONTRAST  Result Date: 12/21/2021 CLINICAL DATA:  Foot swelling, diabetic, osteomyelitis suspected, xray done. Second toe ulcer EXAM: MRI OF THE LEFT FOOT WITHOUT CONTRAST TECHNIQUE:  Multiplanar, multisequence MR imaging of the left forefoot was performed. No intravenous contrast was administered. COMPARISON:  X-ray 12/20/2021 FINDINGS: Technical Note: Despite efforts by the technologist and patient, motion artifact is present on today's exam and could not be eliminated. This reduces exam sensitivity and specificity. Bones/Joint/Cartilage Mild bone marrow edema within the distal phalanx of the second toe without focal erosion. Preserved fatty T1 marrow signal at this location. Bone marrow edema at the second metatarsal base, and to a lesser degree at the third metatarsal base, favored reactive/degenerative secondary to mild-moderate tarsometatarsal joint osteoarthritis. Degenerative changes are also present at the first MTP joint. No fracture or dislocation. Mild hallux valgus alignment. Ligaments Intact Lisfranc ligament. Collateral ligaments of the forefoot appear intact. Muscles and Tendons Denervation changes of the intrinsic foot musculature. No tenosynovial fluid collection. Soft tissues Subcutaneous edema over the dorsal forefoot. Mild soft tissue swelling of the second toe. No well-defined deep soft tissue ulceration. No organized fluid collection. IMPRESSION: 1. Mild bone marrow edema  within the distal phalanx of the second toe. Findings are favored to represent changes of reactive osteitis. Early acute osteomyelitis would be difficult to exclude. 2. Subcutaneous edema over the dorsal forefoot and second toe, which may reflect cellulitis. No deep soft tissue ulceration or organized fluid collection. 3. Bone marrow edema at the second and third metatarsal heads, favored to be reactive/degenerative secondary to tarsometatarsal joint osteoarthritis. Electronically Signed   By: Davina Poke D.O.   On: 12/21/2021 13:05   US ARTERIAL ABI (SCREENING LOWER EXTREMITY)  Result Date: 12/20/2021 CLINICAL DATA:  Hypertension Left vascular ulcer Hyperlipidemia Diabetes EXAM: NONINVASIVE  PHYSIOLOGIC VASCULAR STUDY OF BILATERAL LOWER EXTREMITIES TECHNIQUE: Evaluation of both lower extremities were performed at rest, including calculation of ankle-brachial indices with single level pressure measurements and doppler recording. COMPARISON:  None available FINDINGS: Right ABI:  1.08 Left ABI:  1.00 Right Lower Extremity: Multiphasic waveform of the posterior tibial and dorsalis pedis arteries. Left Lower Extremity: Monophasic waveform of the posterior tibial artery. Multiphasic waveform of the dorsalis pedis artery. 1.0-1.4 Normal IMPRESSION: Ankle brachial indices within normal limits. Electronically Signed   By: Miachel Roux M.D.   On: 12/20/2021 14:43   DG Chest Port 1 View  Result Date: 12/19/2021 CLINICAL DATA:  Possible sepsis EXAM: PORTABLE CHEST 1 VIEW COMPARISON:  11/11/2019 FINDINGS: Cardiac shadow is enlarged. Postsurgical changes are again seen and stable. The lungs are clear bilaterally. No acute bony abnormality is noted. IMPRESSION: No active disease. Electronically Signed   By: Inez Catalina M.D.   On: 12/19/2021 18:32   DG Foot 2 Views Left  Result Date: 12/20/2021 CLINICAL DATA:  LEFT foot pain and swelling.  No known injury. EXAM: LEFT FOOT - 2 VIEW COMPARISON:  None Available. FINDINGS: No acute fracture or dislocation identified. There is no evidence of acute osteomyelitis noted. Heavy vascular calcifications are present. Equivocal mild soft tissue swelling noted. IMPRESSION: 1. Equivocal mild soft tissue swelling without acute bony abnormality. 2. Heavy vascular calcifications. Electronically Signed   By: Margarette Canada M.D.   On: 12/20/2021 11:57    Blood pressure 117/63, pulse 73, temperature 98.2 F (36.8 C), resp. rate 18, height '5\' 8"'$  (1.727 m), weight 98.4 kg, SpO2 97 %.  Assessment Cellulitis left leg and left second toe likely related to left second toe blister/ulcer and scabs to the left lower leg. Diabetes type 2 polyneuropathy PVD with arterial  calcinosis  Plan -Patient seen and examined. -Reviewed x-ray imaging.  No evidence of osteomyelitis based on x-ray imaging that was performed the left foot at the second toe. -Mri reviewed.  Subtle changes for acute osteo of the L 2nd toe.  Agree with radiology read.  Do not see T1 replacement but STIR has marrow edema of the distal phalanx. -All treatment options were discussed with the patient of both conservative and surgical attempts at correction including potential risks and complications.  Patient has elected for procedure consisting of left 2nd toe partial amputation.  No guarantees given.  Consent obtained. -Betadine wet-to-dry dressing placed.  Recommend changing daily. -Wound culture was taken, pending.  GPC.  Appreciate medicine recommendations for antibiotic therapy. -Appreciate vascular recs.  State that he should have enough flow to heal amp.  Will re-eval in outpatient setting if slow to heal for possible CTA. -Surgical shoe ordered. -Pt to be NPO at 0800 tomorrow morning, surgery tomorrow at 1630.  Caroline More, DPM 12/21/2021, 2:43 PM

## 2021-12-21 NOTE — Progress Notes (Addendum)
[facility] NAME: Ricardo Brown    MR#:  [id number]  DATE OF BIRTH:  05/18/1961  SUBJECTIVE:  family at bedside. Patient comes in with bilateral lower extremity redness more on the left along with left second toe bruise and read. Patient has peripheral neuropathy and if not does not remember any injury to the second toe on the left. Had fever of 102. Currently denies any pain. No fever C/o left calf swelling+   VITALS:  Blood pressure 117/63, pulse 73, temperature 98.2 F (36.8 C), resp. rate 18, height '5\' 8"'$  (1.727 m), weight 98.4 kg, SpO2 97 %.  PHYSICAL EXAMINATION:   GENERAL:  61 y.o.-year-old patient lying in the bed with no acute distress. Obese LUNGS: Normal breath sounds bilaterally, no wheezing, rales, rhonchi.  CARDIOVASCULAR: S1, S2 normal. No murmurs, rubs, or gallops.  ABDOMEN: Soft, nontender, nondistended. Bowel sounds present.  EXTREMITIES      NEUROLOGIC: nonfocal  patient is alert and awake. Patient has neuropathy.    LABORATORY PANEL:  CBC Recent Labs  Lab 12/20/21 0548  WBC 19.6*  HGB 10.4*  HCT 31.7*  PLT 139*     Chemistries  Recent Labs  Lab 12/19/21 1727 12/20/21 0548  NA 135 136  K 4.8 4.4  CL 100 102  CO2 25 25  GLUCOSE 190* 142*  BUN 40* 42*  CREATININE 1.93* 1.80*  CALCIUM 8.9 8.2*  AST 22  --   ALT 22  --   ALKPHOS 48  --   BILITOT 0.8  --     Cardiac Enzymes No results for input(s): TROPONINI in the last 168 hours. RADIOLOGY:  MR FOOT LEFT WO CONTRAST  Result Date: 12/21/2021 CLINICAL DATA:  Foot swelling, diabetic, osteomyelitis suspected, xray done. Second toe ulcer EXAM: MRI OF THE LEFT FOOT WITHOUT CONTRAST TECHNIQUE: Multiplanar, multisequence MR imaging of the left forefoot was performed. No intravenous contrast was administered. COMPARISON:  X-ray 12/20/2021 FINDINGS: Technical Note: Despite efforts by the technologist and patient, motion artifact is present  on today's exam and could not be eliminated. This reduces exam sensitivity and specificity. Bones/Joint/Cartilage Mild bone marrow edema within the distal phalanx of the second toe without focal erosion. Preserved fatty T1 marrow signal at this location. Bone marrow edema at the second metatarsal base, and to a lesser degree at the third metatarsal base, favored reactive/degenerative secondary to mild-moderate tarsometatarsal joint osteoarthritis. Degenerative changes are also present at the first MTP joint. No fracture or dislocation. Mild hallux valgus alignment. Ligaments Intact Lisfranc ligament. Collateral ligaments of the forefoot appear intact. Muscles and Tendons Denervation changes of the intrinsic foot musculature. No tenosynovial fluid collection. Soft tissues Subcutaneous edema over the dorsal forefoot. Mild soft tissue swelling of the second toe. No well-defined deep soft tissue ulceration. No organized fluid collection. IMPRESSION: 1. Mild bone marrow edema within the distal phalanx of the second toe. Findings are favored to represent changes of reactive osteitis. Early acute osteomyelitis would be difficult to exclude. 2. Subcutaneous edema over the dorsal forefoot and second toe, which may reflect cellulitis. No deep soft tissue ulceration or organized fluid collection. 3. Bone marrow edema at the second and third metatarsal heads, favored to be reactive/degenerative secondary to tarsometatarsal joint osteoarthritis. Electronically Signed   By: Davina Poke D.O.   On: 12/21/2021 13:05   US ARTERIAL ABI (SCREENING LOWER EXTREMITY)  Result Date: 12/20/2021 CLINICAL DATA:  Hypertension Left vascular ulcer Hyperlipidemia Diabetes EXAM: NONINVASIVE PHYSIOLOGIC  VASCULAR STUDY OF BILATERAL LOWER EXTREMITIES TECHNIQUE: Evaluation of both lower extremities were performed at rest, including calculation of ankle-brachial indices with single level pressure measurements and doppler recording. COMPARISON:   None available FINDINGS: Right ABI:  1.08 Left ABI:  1.00 Right Lower Extremity: Multiphasic waveform of the posterior tibial and dorsalis pedis arteries. Left Lower Extremity: Monophasic waveform of the posterior tibial artery. Multiphasic waveform of the dorsalis pedis artery. 1.0-1.4 Normal IMPRESSION: Ankle brachial indices within normal limits. Electronically Signed   By: Miachel Roux M.D.   On: 12/20/2021 14:43   DG Chest Port 1 View  Result Date: 12/19/2021 CLINICAL DATA:  Possible sepsis EXAM: PORTABLE CHEST 1 VIEW COMPARISON:  11/11/2019 FINDINGS: Cardiac shadow is enlarged. Postsurgical changes are again seen and stable. The lungs are clear bilaterally. No acute bony abnormality is noted. IMPRESSION: No active disease. Electronically Signed   By: Inez Catalina M.D.   On: 12/19/2021 18:32   DG Foot 2 Views Left  Result Date: 12/20/2021 CLINICAL DATA:  LEFT foot pain and swelling.  No known injury. EXAM: LEFT FOOT - 2 VIEW COMPARISON:  None Available. FINDINGS: No acute fracture or dislocation identified. There is no evidence of acute osteomyelitis noted. Heavy vascular calcifications are present. Equivocal mild soft tissue swelling noted. IMPRESSION: 1. Equivocal mild soft tissue swelling without acute bony abnormality. 2. Heavy vascular calcifications. Electronically Signed   By: Margarette Canada M.D.   On: 12/20/2021 11:57    Assessment and Plan Ricardo Brown is a 60 year old male with history of poorly controlled insulin-dependent diabetes mellitus, history of MI status post CABG in 2019, hypertension, hyperlipidemia, morbid obesity, PAD, CAD, who presents emergency department for chief concerns of chills and swelling with redness of the left lower extremity.  Severe sepsis due to left leg cellulitis left second toe infection possible osteomyelitis -- came in with leukocytosis, fever of 102, lactic acid 2.2, lower extremity erythema -- continue vancomycin and cefepime -- follow blood  culture -- podiatry consultation with Dr. Luana Shu. X-ray for an MRI of the foot. --MRI foot, left1. Mild bone marrow edema within the distal phalanx of the second toe. Findings are favored to represent changes of reactive osteitis. Early acute osteomyelitis would be difficult to exclude. 2. Subcutaneous edema over the dorsal forefoot and second toe, which may reflect cellulitis. No deep soft tissue ulceration or organized fluid collection. 3. Bone marrow edema at the second and third metatarsal heads, favored to be reactive/degenerative secondary to tarsometatarsal joint osteoarthritis.   AKI on chronic kidney disease stage IIIb -- baseline creatinine around 1.5--1.8 -- came in with creatinine of 1.93--- 1.8  type II diabetes uncontrolled with hypoglycemia and CKD -- continue long acting insulin along with sliding scale -- will resume home meds at discharge  CAD status post cabg  x 3 -- continue aspirin, statin, beta-blockers --pt is NSR--d/c tele  H/o SVT --pt is NSR --cont amiodarone, BB--d/c tele for now  HTN --BP soft --holding losartan, torsemide   Procedures: Family communication :wife at bedside Consults :Podiatry CODE STATUS: full DVT Prophylaxis :enoxaparin Level of care: Med-Surg Status is: Inpatient Remains inpatient appropriate because: sepsis due to cellulitis    TOTAL TIME TAKING CARE OF THIS PATIENT: 35 minutes.  >50% time spent on counselling and coordination of care  Note: This dictation was prepared with Dragon dictation along with smaller phrase technology. Any transcriptional errors that result from this process are unintentional.  [name] M.D    [facility]   CC:  Primary care physician; [name], MD

## 2021-12-22 ENCOUNTER — Inpatient Hospital Stay: Payer: 59

## 2021-12-22 ENCOUNTER — Inpatient Hospital Stay: Payer: 59 | Admitting: Anesthesiology

## 2021-12-22 ENCOUNTER — Encounter: Payer: Self-pay | Admitting: Internal Medicine

## 2021-12-22 ENCOUNTER — Other Ambulatory Visit: Payer: Self-pay

## 2021-12-22 ENCOUNTER — Encounter
Admission: EM | Disposition: A | Discharge status: Home / Self Care | Payer: Self-pay | Source: Home / Self Care | Attending: Internal Medicine

## 2021-12-22 DIAGNOSIS — A419 Sepsis, unspecified organism: Secondary | ICD-10-CM | POA: Diagnosis not present

## 2021-12-22 DIAGNOSIS — R6520 Severe sepsis without septic shock: Secondary | ICD-10-CM | POA: Diagnosis not present

## 2021-12-22 HISTORY — PX: AMPUTATION TOE: SHX6595

## 2021-12-22 LAB — GLUCOSE, CAPILLARY
Glucose-Capillary: 122 mg/dL — ABNORMAL HIGH (ref 70–99)
Glucose-Capillary: 161 mg/dL — ABNORMAL HIGH (ref 70–99)
Glucose-Capillary: 108 mg/dL — ABNORMAL HIGH (ref 70–99)
Glucose-Capillary: 99 mg/dL (ref 70–99)
Glucose-Capillary: 91 mg/dL (ref 70–99)
Glucose-Capillary: 164 mg/dL — ABNORMAL HIGH (ref 70–99)

## 2021-12-22 LAB — URINE CULTURE: Culture: NO GROWTH

## 2021-12-22 LAB — TROPONIN I (HIGH SENSITIVITY): Troponin I (High Sensitivity): 16 ng/L (ref <18)

## 2021-12-22 SURGERY — AMPUTATION, TOE
Anesthesia: General | Site: Toe | Laterality: Left

## 2021-12-22 MED ORDER — MIDAZOLAM HCL 2 MG/2ML IJ SOLN
INTRAMUSCULAR | Status: AC
  Filled 2021-12-22: qty 2

## 2021-12-22 MED ORDER — MIDAZOLAM HCL 2 MG/2ML IJ SOLN
INTRAMUSCULAR | Status: DC | PRN
  Administered 2021-12-22: 2 mg via INTRAVENOUS

## 2021-12-22 MED ORDER — PROPOFOL 500 MG/50ML IV EMUL
INTRAVENOUS | Status: DC | PRN
  Administered 2021-12-22: 50 ug/kg/min via INTRAVENOUS

## 2021-12-22 MED ORDER — CHLORHEXIDINE GLUCONATE 4 % EX LIQD: 60.0000 mL | Freq: Once | CUTANEOUS | Status: DC

## 2021-12-22 MED ORDER — LIDOCAINE HCL (PF) 2 % IJ SOLN
INTRAMUSCULAR | Status: AC
Start: 2021-12-22 — End: ?
  Filled 2021-12-22: qty 5

## 2021-12-22 MED ORDER — 0.9 % SODIUM CHLORIDE (POUR BTL) OPTIME
TOPICAL | Status: DC | PRN
Start: 2021-12-22 — End: 2021-12-22
  Administered 2021-12-22: 1000 mL

## 2021-12-22 MED ORDER — PROPOFOL 10 MG/ML IV BOLUS
INTRAVENOUS | Status: AC
  Filled 2021-12-22: qty 20

## 2021-12-22 MED ORDER — LIDOCAINE HCL (CARDIAC) PF 100 MG/5ML IV SOSY
PREFILLED_SYRINGE | INTRAVENOUS | Status: DC | PRN
  Administered 2021-12-22: 80 mg via INTRAVENOUS

## 2021-12-22 MED ORDER — FENTANYL CITRATE (PF) 100 MCG/2ML IJ SOLN
INTRAMUSCULAR | Status: DC | PRN
  Administered 2021-12-22: 50 ug via INTRAVENOUS

## 2021-12-22 MED ORDER — BUPIVACAINE HCL 0.5 % IJ SOLN
INTRAMUSCULAR | Status: DC | PRN
  Administered 2021-12-22: 10 mL

## 2021-12-22 MED ORDER — ONDANSETRON HCL 4 MG/2ML IJ SOLN
INTRAMUSCULAR | Status: DC | PRN
  Administered 2021-12-22: 4 mg via INTRAVENOUS

## 2021-12-22 MED ORDER — NEOMYCIN-POLYMYXIN B GU 40-200000 IR SOLN
Status: DC | PRN
  Administered 2021-12-22: 4 mL

## 2021-12-22 MED ORDER — FENTANYL CITRATE (PF) 100 MCG/2ML IJ SOLN
INTRAMUSCULAR | Status: AC
  Filled 2021-12-22: qty 2

## 2021-12-22 MED ORDER — SODIUM CHLORIDE 0.9 % IV SOLN: INTRAVENOUS | Status: DC | PRN

## 2021-12-22 MED ORDER — FENTANYL CITRATE (PF) 100 MCG/2ML IJ SOLN: 25.0000 ug | INTRAMUSCULAR | Status: DC | PRN

## 2021-12-22 MED ORDER — PROPOFOL 10 MG/ML IV BOLUS
INTRAVENOUS | Status: DC | PRN
  Administered 2021-12-22: 20 mg via INTRAVENOUS

## 2021-12-22 SURGICAL SUPPLY — 47 items
BLADE MED AGGRESSIVE (BLADE) ×2 IMPLANT
BLADE OSC/SAGITTAL MD 5.5X18 (BLADE) IMPLANT
BLADE SURG 15 STRL LF DISP TIS (BLADE) IMPLANT
BLADE SURG 15 STRL SS (BLADE)
BLADE SURG MINI STRL (BLADE) IMPLANT
BNDG CONFORM 2 STRL LF (GAUZE/BANDAGES/DRESSINGS) IMPLANT
BNDG ELASTIC 4X5.8 VLCR STR LF (GAUZE/BANDAGES/DRESSINGS) ×2 IMPLANT
BNDG ESMARK 4X12 TAN STRL LF (GAUZE/BANDAGES/DRESSINGS) ×2 IMPLANT
BNDG GAUZE ELAST 4 BULKY (GAUZE/BANDAGES/DRESSINGS) ×2 IMPLANT
BNDG STRETCH 4X75 STRL LF (GAUZE/BANDAGES/DRESSINGS) ×1 IMPLANT
CNTNR SPEC 2.5X3XGRAD LEK (MISCELLANEOUS) ×1
CONT SPEC 4OZ STER OR WHT (MISCELLANEOUS) ×1
CONTAINER SPEC 2.5X3XGRAD LEK (MISCELLANEOUS) ×1 IMPLANT
CUFF TOURN SGL QUICK 12 (TOURNIQUET CUFF) IMPLANT
CUFF TOURN SGL QUICK 18X4 (TOURNIQUET CUFF) IMPLANT
DRAPE FLUOR MINI C-ARM 54X84 (DRAPES) IMPLANT
DURAPREP 26ML APPLICATOR (WOUND CARE) ×2 IMPLANT
ELECT REM PT RETURN 9FT ADLT (ELECTROSURGICAL) ×2
ELECTRODE REM PT RTRN 9FT ADLT (ELECTROSURGICAL) ×1 IMPLANT
GAUZE SPONGE 4X4 12PLY STRL (GAUZE/BANDAGES/DRESSINGS) ×3 IMPLANT
GAUZE XEROFORM 1X8 LF (GAUZE/BANDAGES/DRESSINGS) ×2 IMPLANT
GLOVE BIO SURGEON STRL SZ7 (GLOVE) ×2 IMPLANT
GLOVE SURG UNDER LTX SZ7 (GLOVE) ×2 IMPLANT
GOWN STRL REUS W/ TWL LRG LVL3 (GOWN DISPOSABLE) ×2 IMPLANT
GOWN STRL REUS W/TWL LRG LVL3 (GOWN DISPOSABLE) ×2
HANDPIECE VERSAJET DEBRIDEMENT (MISCELLANEOUS) IMPLANT
IV NS IRRIG 3000ML ARTHROMATIC (IV SOLUTION) IMPLANT
KIT TURNOVER KIT A (KITS) ×2 IMPLANT
LABEL OR SOLS (LABEL) ×2 IMPLANT
MANIFOLD NEPTUNE II (INSTRUMENTS) ×2 IMPLANT
NDL FILTER BLUNT 18X1 1/2 (NEEDLE) ×1 IMPLANT
NDL HYPO 25X1 1.5 SAFETY (NEEDLE) ×2 IMPLANT
NEEDLE FILTER BLUNT 18X 1/2SAF (NEEDLE) ×1
NEEDLE FILTER BLUNT 18X1 1/2 (NEEDLE) ×1 IMPLANT
NEEDLE HYPO 25X1 1.5 SAFETY (NEEDLE) ×4 IMPLANT
NS IRRIG 500ML POUR BTL (IV SOLUTION) ×2 IMPLANT
PACK EXTREMITY ARMC (MISCELLANEOUS) ×2 IMPLANT
SOL PREP PVP 2OZ (MISCELLANEOUS) ×2
SOLUTION PREP PVP 2OZ (MISCELLANEOUS) ×1 IMPLANT
STOCKINETTE STRL 6IN 960660 (GAUZE/BANDAGES/DRESSINGS) ×2 IMPLANT
SUT ETHILON 3-0 FS-10 30 BLK (SUTURE) ×4
SUT VIC AB 3-0 SH 27 (SUTURE) ×1
SUT VIC AB 3-0 SH 27X BRD (SUTURE) ×1 IMPLANT
SUTURE EHLN 3-0 FS-10 30 BLK (SUTURE) ×2 IMPLANT
SWAB CULTURE AMIES ANAERIB BLU (MISCELLANEOUS) IMPLANT
SYR 10ML LL (SYRINGE) ×2 IMPLANT
WATER STERILE IRR 500ML POUR (IV SOLUTION) ×2 IMPLANT

## 2021-12-22 NOTE — Plan of Care (Signed)
  Problem: Activity: Goal: Risk for activity intolerance will decrease Outcome: Progressing   Problem: Coping: Goal: Level of anxiety will decrease Outcome: Progressing   Problem: Elimination: Goal: Will not experience complications related to bowel motility Outcome: Progressing Goal: Will not experience complications related to urinary retention Outcome: Progressing   Problem: Skin Integrity: Goal: Risk for impaired skin integrity will decrease Outcome: Progressing

## 2021-12-22 NOTE — Progress Notes (Signed)
Patient reported feeling short of breath after using the bathroom,he also complained of tightness in his chest. He rated this pain about a 3. Vitals were taken listed below. STAT EKG done.Ricardo Fuller, NP was notified, ordered nitro and trop levels. Patient refused nitro.  2200 Went to check on patient he stated that his chest pain had gone away, no further complaints.  Will continue to monitor.   12/21/21 2121  Vitals  Temp 99.6 F (37.6 C)  BP 117/61  MAP (mmHg) 77  BP Location Right Arm  BP Method Automatic  Patient Position (if appropriate) Lying  Pulse Rate 84  Pulse Rate Source Monitor  Resp 18  MEWS COLOR  MEWS Score Color Green  Oxygen Therapy  SpO2 96 %  O2 Device Room Air  MEWS Score  MEWS Temp 0  MEWS Systolic 0  MEWS Pulse 0  MEWS RR 0  MEWS LOC 0  MEWS Score 0

## 2021-12-22 NOTE — Plan of Care (Signed)
  Problem: Elimination: Goal: Will not experience complications related to bowel motility Outcome: Progressing Goal: Will not experience complications related to urinary retention Outcome: Progressing   Problem: Coping: Goal: Level of anxiety will decrease Outcome: Progressing   Problem: Activity: Goal: Risk for activity intolerance will decrease Outcome: Progressing

## 2021-12-22 NOTE — Op Note (Signed)
PODIATRY / FOOT AND ANKLE SURGERY OPERATIVE REPORT    SURGEON: Caroline More, DPM  PRE-OPERATIVE DIAGNOSIS:  1.  Left second toe osteomyelitis with associated diabetic foot ulceration, cellulitis 2.  Cellulitis left lower extremity 3.  Diabetes type 2 polyneuropathy 4.  PVD  POST-OPERATIVE DIAGNOSIS: Same  PROCEDURE(S): Left second toe partial amputation  HEMOSTASIS: Left ankle tourniquet  ANESTHESIA: MAC  ESTIMATED BLOOD LOSS: 5 cc  FINDING(S): 1.  Left second toe DIPJ disarticulation, distal phalanx appeared to be soft and consistent with osteomyelitis.  Head of the middle phalanx did not appear to have any symptoms of osteomyelitis present as it appeared to be hard and healthy.  No articular cartilage damage to the area.  PATHOLOGY/SPECIMEN(S): Left second toe proximal margin marked in purple ink distal phalanx  INDICATIONS:   Ricardo Brown is a 61 y.o. male who presents with a open ulceration to the left second toe with toenail coming off of the area and cellulitis to left lower extremity.  Patient was admitted to the hospital due to concerns for diabetic foot infection.  MRI imaging was taken showing acute osteomyelitis to the distal aspect left second toe.  All treatment options were discussed with the patient both conservative and surgical attempts at correction include potential risks and complications at this time patient is elected for surgical intervention consisting of left second toe partial amputation.  No guarantees given.  Consent obtained prior to procedure today.  DESCRIPTION: After obtaining full informed written consent, the patient was brought back to the operating room and placed supine upon the operating table.  The patient received IV antibiotics prior to induction.  After obtaining adequate anesthesia, a digital block was performed with 10 cc of half percent Marcaine plain about the second toe.  The patient was prepped and draped in the standard  fashion.  An Esmarch bandage was used to exsanguinate the left lower extremity and pneumatic ankle tourniquet was inflated.  Attention was then directed to the left second toe where a fishmouth incision was made at the level of the DIPJ and full-thickness flaps were created after the incision was made straight to bone.  The extensor tendon overlying the DIPJ was incised as well as and capsulotomy to the area followed by release the collateral ligaments and any connection to the flexor tendon/plantar plate to the area.  The distal phalanx was disarticulated and passed off in the operative site with the proximal margin marked in purple ink.  The specimen was then passed off the operative site and sent off to pathology for further assessment.  The articular cartilage of the middle phalanx appeared to be intact and did not have any evidence of osteomyelitis present to the area clinically as the bone appeared to be very hard and had a fairly normal morphology based on clinical appearance.  The surgical site was flushed with copious amounts normal sterile saline.  Any bleeding vessels were cauterized as necessary.  The skin incision was then reapproximated well coapted with 3-0 nylon in simple type stitching.  The pneumatic ankle tourniquet was deflated and a prompt hyperemic response was noted to all digits left foot.  A postoperative dressing was then applied consisting of Xeroform to the incision line followed by 4 x 4 gauze, Kerlix, Ace wrap.  The patient tolerated the procedure and anesthesia well was transferred to recovery room vital signs stable vascular status intact all toes left foot.  Following a period of postoperative monitoring the patient be discharged back to  the inpatient room with the appropriate orders, medications, and instructions.  We will see patient again tomorrow for dressing change and if everything looks good we will likely sign off at that point.  Patient to remain partial weightbearing  with surgical shoe on and keep dressings clean, dry, and intact until tomorrow.  COMPLICATIONS: None  CONDITION: Good, stable  Caroline More, DPM

## 2021-12-22 NOTE — Anesthesia Procedure Notes (Signed)
Date/Time: 12/22/2021 4:28 PM Performed by: Johnna Acosta, CRNA Pre-anesthesia Checklist: Patient identified, Emergency Drugs available, Suction available, Patient being monitored and Timeout performed Patient Re-evaluated:Patient Re-evaluated prior to induction Oxygen Delivery Method: Simple face mask Preoxygenation: Pre-oxygenation with 100% oxygen Induction Type: IV induction

## 2021-12-22 NOTE — H&P (Signed)
HISTORY AND PHYSICAL INTERVAL NOTE:  12/22/2021  4:21 PM  Ricardo Brown  has presented today for surgery, with the diagnosis of Osteomyelitis left 2nd toe with DFU and cellulitis.  The various methods of treatment have been discussed with the patient.  No guarantees were given.  After consideration of risks, benefits and other options for treatment, the patient has consented to surgery.  I have reviewed the patients' chart and labs.    PROCEDURE: LEFT PARTIAL 2ND TOE AMPUTATION  A history and physical examination was performed in the hospital.  The patient was reexamined.  There have been no changes to this history and physical examination.  Caroline More, DPM

## 2021-12-22 NOTE — Progress Notes (Signed)
[facility] [place] NAME: Ricardo Brown    MR#:  [id number]  DATE OF BIRTH:  [birth year]  SUBJECTIVE:  family at bedside. Patient comes in with bilateral lower extremity redness more on the left along with left second toe bruise and read. Patient has peripheral neuropathy and if not does not remember any injury to the second toe on the left. Had fever of 102. Currently denies any pain. No fever C/o left calf swelling+ Chest pressure last pm-- none today. Vitals ok  VITALS:  Blood pressure 120/62, pulse 77, temperature 99 F (37.2 C), resp. rate 16, height '5\' 8"'$  (1.727 m), weight 98.4 kg, SpO2 96 %.  PHYSICAL EXAMINATION:   GENERAL:  62 y.o.-year-old patient lying in the bed with no acute distress. Obese LUNGS: Normal breath sounds bilaterally, no wheezing, rales, rhonchi.  CARDIOVASCULAR: S1, S2 normal. No murmurs, rubs, or gallops.  ABDOMEN: Soft, nontender, nondistended. Bowel sounds present.  EXTREMITIES      NEUROLOGIC: nonfocal  patient is alert and awake. Patient has peripheral neuropathy.    LABORATORY PANEL:  CBC Recent Labs  Lab 12/20/21 0548  WBC 19.6*  HGB 10.4*  HCT 31.7*  PLT 139*     Chemistries  Recent Labs  Lab 12/19/21 1727 12/20/21 0548  NA 135 136  K 4.8 4.4  CL 100 102  CO2 25 25  GLUCOSE 190* 142*  BUN 40* 42*  CREATININE 1.93* 1.80*  CALCIUM 8.9 8.2*  AST 22  --   ALT 22  --   ALKPHOS 48  --   BILITOT 0.8  --     Cardiac Enzymes No results for input(s): TROPONINI in the last 168 hours. RADIOLOGY:  MR FOOT LEFT WO CONTRAST  Result Date: 12/21/2021 CLINICAL DATA:  Foot swelling, diabetic, osteomyelitis suspected, xray done. Second toe ulcer EXAM: MRI OF THE LEFT FOOT WITHOUT CONTRAST TECHNIQUE: Multiplanar, multisequence MR imaging of the left forefoot was performed. No intravenous contrast was administered. COMPARISON:  X-ray 12/20/2021 FINDINGS: Technical Note: Despite efforts by the  technologist and patient, motion artifact is present on today's exam and could not be eliminated. This reduces exam sensitivity and specificity. Bones/Joint/Cartilage Mild bone marrow edema within the distal phalanx of the second toe without focal erosion. Preserved fatty T1 marrow signal at this location. Bone marrow edema at the second metatarsal base, and to a lesser degree at the third metatarsal base, favored reactive/degenerative secondary to mild-moderate tarsometatarsal joint osteoarthritis. Degenerative changes are also present at the first MTP joint. No fracture or dislocation. Mild hallux valgus alignment. Ligaments Intact Lisfranc ligament. Collateral ligaments of the forefoot appear intact. Muscles and Tendons Denervation changes of the intrinsic foot musculature. No tenosynovial fluid collection. Soft tissues Subcutaneous edema over the dorsal forefoot. Mild soft tissue swelling of the second toe. No well-defined deep soft tissue ulceration. No organized fluid collection. IMPRESSION: 1. Mild bone marrow edema within the distal phalanx of the second toe. Findings are favored to represent changes of reactive osteitis. Early acute osteomyelitis would be difficult to exclude. 2. Subcutaneous edema over the dorsal forefoot and second toe, which may reflect cellulitis. No deep soft tissue ulceration or organized fluid collection. 3. Bone marrow edema at the second and third metatarsal heads, favored to be reactive/degenerative secondary to tarsometatarsal joint osteoarthritis. Electronically Signed   By: Davina Poke D.O.   On: 12/21/2021 13:05   US Venous Img Lower Unilateral Left (DVT)  Result Date: 12/22/2021 CLINICAL DATA:  Left lower extremity pain and edema. Gangrenous left toe. Evaluate for DVT. EXAM: LEFT LOWER EXTREMITY VENOUS DOPPLER ULTRASOUND TECHNIQUE: Gray-scale sonography with graded compression, as well as color Doppler and duplex ultrasound were performed to evaluate the lower  extremity deep venous systems from the level of the common femoral vein and including the common femoral, femoral, profunda femoral, popliteal and calf veins including the posterior tibial, peroneal and gastrocnemius veins when visible. The superficial great saphenous vein was also interrogated. Spectral Doppler was utilized to evaluate flow at rest and with distal augmentation maneuvers in the common femoral, femoral and popliteal veins. COMPARISON:  None Available. FINDINGS: Contralateral Common Femoral Vein: Respiratory phasicity is normal and symmetric with the symptomatic side. No evidence of thrombus. Normal compressibility. Common Femoral Vein: No evidence of thrombus. Normal compressibility, respiratory phasicity and response to augmentation. Saphenofemoral Junction: No evidence of thrombus. Normal compressibility and flow on color Doppler imaging. Profunda Femoral Vein: No evidence of thrombus. Normal compressibility and flow on color Doppler imaging. Femoral Vein: No evidence of thrombus. Normal compressibility, respiratory phasicity and response to augmentation. Popliteal Vein: No evidence of thrombus. Normal compressibility, respiratory phasicity and response to augmentation. Calf Veins: No evidence of thrombus. Normal compressibility and flow on color Doppler imaging. Superficial Great Saphenous Vein: No evidence of thrombus. Normal compressibility. Venous Reflux:  None. Other Findings:  None. IMPRESSION: No evidence of DVT within the left lower extremity. Electronically Signed   By: Sandi Mariscal M.D.   On: 12/22/2021 08:00   US ARTERIAL ABI (SCREENING LOWER EXTREMITY)  Result Date: 12/20/2021 CLINICAL DATA:  Hypertension Left vascular ulcer Hyperlipidemia Diabetes EXAM: NONINVASIVE PHYSIOLOGIC VASCULAR STUDY OF BILATERAL LOWER EXTREMITIES TECHNIQUE: Evaluation of both lower extremities were performed at rest, including calculation of ankle-brachial indices with single level pressure measurements and  doppler recording. COMPARISON:  None available FINDINGS: Right ABI:  1.08 Left ABI:  1.00 Right Lower Extremity: Multiphasic waveform of the posterior tibial and dorsalis pedis arteries. Left Lower Extremity: Monophasic waveform of the posterior tibial artery. Multiphasic waveform of the dorsalis pedis artery. 1.0-1.4 Normal IMPRESSION: Ankle brachial indices within normal limits. Electronically Signed   By: Miachel Roux M.D.   On: 12/20/2021 14:43   DG Foot 2 Views Left  Result Date: 12/20/2021 CLINICAL DATA:  LEFT foot pain and swelling.  No known injury. EXAM: LEFT FOOT - 2 VIEW COMPARISON:  None Available. FINDINGS: No acute fracture or dislocation identified. There is no evidence of acute osteomyelitis noted. Heavy vascular calcifications are present. Equivocal mild soft tissue swelling noted. IMPRESSION: 1. Equivocal mild soft tissue swelling without acute bony abnormality. 2. Heavy vascular calcifications. Electronically Signed   By: Margarette Canada M.D.   On: 12/20/2021 11:57    Assessment and Plan Ricardo Brown is a 61 year old male with history of poorly controlled insulin-dependent diabetes mellitus, history of MI status post CABG in 2019, hypertension, hyperlipidemia, morbid obesity, PAD, CAD, who presents emergency department for chief concerns of chills and swelling with redness of the left lower extremity.  Severe sepsis due to left leg cellulitis left second toe infection possible osteomyelitis -- came in with leukocytosis, fever of 102, lactic acid 2.2, lower extremity erythema -- continue vancomycin and cefepime -- follow blood culture -- podiatry consultation with Dr. Luana Shu. X-ray for an MRI of the foot. --MRI foot, left1. Mild bone marrow edema within the distal phalanx of the second toe. Findings are favored to represent changes of reactive osteitis. Early acute osteomyelitis would be difficult to exclude.  2. Subcutaneous edema over the dorsal forefoot and second toe,  which may reflect cellulitis. No deep soft tissue ulceration or organized fluid collection. 3. Bone marrow edema at the second and third metatarsal heads, favored to be reactive/degenerative secondary to tarsometatarsal joint osteoarthritis. --5/22--for left 2nd toe amputation today per Dr Luana Shu --Left LE doppler negative for DVT   AKI on chronic kidney disease stage IIIb -- baseline creatinine around 1.5--1.8 -- came in with creatinine of 1.93--- 1.8  type II diabetes uncontrolled with hypoglycemia and CKD -- continue long acting insulin along with sliding scale -- will resume home meds at discharge  CAD status post cabg  x 3 -- continue aspirin, statin, beta-blockers --pt is NSR --troponin x2 neg. Follows with dr Josefa Half --cp free today --EKG no acute changes  H/o SVT --pt is NSR --cont amiodarone, BB  HTN --BP soft --holding losartan, torsemide   Procedures: Family communication :wife at bedside Consults :Podiatry CODE STATUS: full DVT Prophylaxis :enoxaparin Level of care: Med-Surg Status is: Inpatient Remains inpatient appropriate because: sepsis due to cellulitis    TOTAL TIME TAKING CARE OF THIS PATIENT: 35 minutes.  >50% time spent on counselling and coordination of care  Note: This dictation was prepared with Dragon dictation along with smaller phrase technology. Any transcriptional errors that result from this process are unintentional.  [name] M.D    [facility]   CC: Primary care physician; [name], MD

## 2021-12-22 NOTE — Anesthesia Preprocedure Evaluation (Signed)
Anesthesia Evaluation  Patient identified by MRN, date of birth, ID band Patient awake    Reviewed: Allergy & Precautions, NPO status , Patient's Chart, lab work & pertinent test results, reviewed documented beta blocker date and time   History of Anesthesia Complications Negative for: history of anesthetic complications  Airway Mallampati: III  TM Distance: >3 FB Neck ROM: Full    Dental  (+) Upper Dentures, Lower Dentures, Dental Advidsory Given   Pulmonary neg pulmonary ROS,    Pulmonary exam normal        Cardiovascular hypertension, Pt. on medications and Pt. on home beta blockers + angina (had chest pain last night, full cardiac workup was negative; attributed to sepsis; feels better today) + CAD, + Past MI (2019), + CABG (2019 x3), + Peripheral Vascular Disease, +CHF and + DOE  Normal cardiovascular exam+ dysrhythmias (RBBB) (-) Valvular Problems/Murmurs  01/2018 EF 35-40%; b. 10/2020 Echo: EF 40-45%, glob HK. RVSP 42.26mHg. Mildly dil LA. Mild MS/AoV sclerosis.  ECHO 3/22: 1. Left ventricular ejection fraction, by estimation, is 40 to 45%. The  left ventricle has mildly decreased function. The left ventricle  demonstrates global hypokinesis. The left ventricular internal cavity size  was mildly dilated. There is mild left  ventricular hypertrophy. Left ventricular diastolic parameters are  indeterminate. The average left ventricular global longitudinal strain is  -12.7 %. The global longitudinal strain is abnormal.  2. Right ventricular systolic function is normal. The right ventricular  size is normal. There is mildly elevated pulmonary artery systolic  pressure. The estimated right ventricular systolic pressure is 473.2mmHg.  3. Left atrial size was mildly dilated.  4. The mitral valve has been repaired/replaced. Trivial mitral valve  regurgitation. Mild mitral stenosis. The mean mitral valve gradient is 7.0  mmHg.  Procedure Date: 2019.  5. The aortic valve is normal in structure. Aortic valve regurgitation is  not visualized. Mild aortic valve sclerosis is present, with no evidence  of aortic valve stenosis.  6. The inferior vena cava is normal in size with greater than 50%  respiratory variability, suggesting right atrial pressure of 3 mmHg.    Neuro/Psych negative neurological ROS  negative psych ROS   GI/Hepatic negative GI ROS, Neg liver ROS,   Endo/Other  diabetes, Well Controlled, Type 2, Oral Hypoglycemic Agents  Renal/GU CRFRenal disease  negative genitourinary   Musculoskeletal negative musculoskeletal ROS (+)   Abdominal Normal abdominal exam  (+)   Peds negative pediatric ROS (+)  Hematology negative hematology ROS (+) Blood dyscrasia, anemia ,   Anesthesia Other Findings 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  CAD, CABG Mitral Valve Repair 2019 Diabetes mellitus [place] complication [place]     Comment:  a. Dx ~ 2000. Heart palpitations High cholesterol  EF 35-40%  Reproductive/Obstetrics                             Anesthesia Physical  Anesthesia Plan  ASA: III  Anesthesia Plan: General   Post-op Pain Management:    Induction: Intravenous  PONV Risk Score and Plan: TIVA and Propofol infusion  Airway Management Planned: Nasal Cannula and Natural Airway  Additional Equipment:   Intra-op Plan:   Post-operative Plan:   Informed Consent: I have reviewed the patients History and Physical, chart, labs and discussed the procedure including the risks, benefits and alternatives for the proposed anesthesia with the patient or authorized representative who has indicated his/her understanding and acceptance.  Dental advisory given  Plan Discussed with: CRNA, Surgeon and Anesthesiologist  Anesthesia Plan Comments:         Anesthesia Quick Evaluation

## 2021-12-22 NOTE — Transfer of Care (Signed)
Immediate Anesthesia Transfer of Care Note  Patient: Ricardo Brown  Procedure(s) Performed: AMPUTATION TOE-2nd Toe Partial (Left: Toe)  Patient Location: PACU  Anesthesia Type:General  Level of Consciousness: drowsy and patient cooperative  Airway & Oxygen Therapy: Patient Spontanous Breathing  Post-op Assessment: Report given to RN and Post -op Vital signs reviewed and stable  Post vital signs: Reviewed and stable  Last Vitals:  Vitals Value Taken Time  BP 114/61 12/22/21 1716  Temp    Pulse 67 12/22/21 1722  Resp 17 12/22/21 1722  SpO2 94 % 12/22/21 1722  Vitals shown include unvalidated device data.  Last Pain:  Vitals:   12/22/21 1532  TempSrc: Temporal  PainSc: 0-No pain      Patients Stated Pain Goal: 0 (92/90/90 3014)  Complications: No notable events documented.

## 2021-12-23 ENCOUNTER — Ambulatory Visit: Payer: 59 | Admitting: Cardiology

## 2021-12-23 ENCOUNTER — Other Ambulatory Visit: Payer: Self-pay

## 2021-12-23 ENCOUNTER — Encounter: Payer: Self-pay | Admitting: Podiatry

## 2021-12-23 DIAGNOSIS — N179 Acute kidney failure, unspecified: Secondary | ICD-10-CM

## 2021-12-23 DIAGNOSIS — L03116 Cellulitis of left lower limb: Secondary | ICD-10-CM

## 2021-12-23 DIAGNOSIS — R0602 Shortness of breath: Secondary | ICD-10-CM

## 2021-12-23 LAB — GLUCOSE, CAPILLARY
Glucose-Capillary: 114 mg/dL — ABNORMAL HIGH (ref 70–99)
Glucose-Capillary: 123 mg/dL — ABNORMAL HIGH (ref 70–99)

## 2021-12-23 MED ORDER — DOXYCYCLINE HYCLATE 100 MG PO TABS
100.0000 mg | ORAL_TABLET | Freq: Two times a day (BID) | ORAL | 0 refills | Status: AC
Start: 2021-12-23 — End: 2021-12-30

## 2021-12-23 MED ORDER — DOXYCYCLINE HYCLATE 100 MG PO TABS: 100.0000 mg | ORAL_TABLET | Freq: Two times a day (BID) | ORAL | Status: DC

## 2021-12-23 MED ORDER — DOXYCYCLINE HYCLATE 100 MG PO TABS
100.0000 mg | ORAL_TABLET | Freq: Two times a day (BID) | ORAL | Status: DC
  Administered 2021-12-23: 100 mg via ORAL
  Filled 2021-12-23: qty 1

## 2021-12-23 NOTE — Plan of Care (Signed)

## 2021-12-23 NOTE — Discharge Summary (Signed)
Physician Discharge Summary   Patient: Ricardo Brown MRN: [id number] DOB: 1960/10/07  Admit date:     12/19/2021  Discharge date: 12/23/21  Discharge Physician: [name]   PCP: [name], MD   Recommendations at discharge:    Dressing  instructions per Dr Vickki Muff F/u PCP in 1-2 weeks  Discharge Diagnoses: Left 2nd toe OM with diabetic foot infection s/p amputation  Hospital Course: Ricardo Brown is a 61 year old male with history of poorly controlled insulin-dependent diabetes mellitus, history of MI status post CABG in 2019, hypertension, hyperlipidemia, morbid obesity, PAD, CAD, who presents emergency department for chief concerns of chills and swelling with redness of the left lower extremity.   Severe sepsis due to left leg cellulitis left second toe infection possible osteomyelitis -- came in with leukocytosis, fever of 102, lactic acid 2.2, lower extremity erythema -- continue vancomycin and cefepime -- follow blood culture -- podiatry consultation with Dr. Luana Shu. X-ray for an MRI of the foot. --MRI foot, left1. Mild bone marrow edema within the distal phalanx of the second toe. Findings are favored to represent changes of reactive osteitis. Early acute osteomyelitis would be difficult to exclude. 2. Subcutaneous edema over the dorsal forefoot and second toe, which may reflect cellulitis. No deep soft tissue ulceration or organized fluid collection. 3. Bone marrow edema at the second and third metatarsal heads, favored to be reactive/degenerative secondary to tarsometatarsal joint osteoarthritis. --5/22--for left 2nd toe amputation today per Dr Luana Shu --Left LE doppler negative for DVT --5/23--dressing will be changed by Dr Vickki Muff. Ok to d/c after that. Seen by PT--no PT needs   AKI on chronic kidney disease stage IIIb -- baseline creatinine around 1.5--1.8 -- came in with creatinine of 1.93--- 1.8   type II diabetes uncontrolled with  hypoglycemia and CKD -- continue long acting insulin along with sliding scale -- will resume home meds at discharge   CAD status post cabg  x 3 -- continue aspirin, statin, beta-blockers --pt is NSR --troponin x2 neg. Follows with dr Josefa Half --cp free today --EKG no acute changes   H/o SVT --pt is NSR --cont amiodarone, BB   HTN --BP soft --resumed losartan, torsemide. Pt advised to hold if Systolic bp >748  Overall stable D/c home Pt agreeable         Consultants: podiatry Procedures performed: left 2nd toe amputation  Disposition: Home Diet recommendation:  Discharge Diet Orders (From admission, onward)     Start     Ordered   12/23/21 0000  Diet - low sodium heart healthy        12/23/21 1048           Cardiac and Carb modified diet DISCHARGE MEDICATION: Allergies as of 12/23/2021       Reactions   Entresto [sacubitril-valsartan] Other (See Comments)   SVT   Lipitor [atorvastatin] Palpitations   SVT   Pregabalin Palpitations   SVT        Medication List     TAKE these medications    amiodarone 200 MG tablet Commonly known as: Pacerone Take 2 tablets (400 mg total) by mouth 2 (two) times daily for 5 days, THEN 2 tablets (400 mg total) daily for 5 days, THEN 1 tablet (200 mg total) daily. Start taking on: September 12, 2021   aspirin EC 81 MG tablet Take 81 mg by mouth daily.   b complex vitamins tablet Take 1 tablet by mouth daily.   clotrimazole-betamethasone cream Commonly known as: LOTRISONE Apply  1 application. topically 2 (two) times daily.   doxycycline 100 MG tablet Commonly known as: VIBRA-TABS Take 1 tablet (100 mg total) by mouth every 12 (twelve) hours for 7 days.   Dulaglutide 1.5 MG/0.5ML Sopn Inject 1.5 mg into the skin every Saturday.   empagliflozin 25 MG Tabs tablet Commonly known as: JARDIANCE Take 25 mg by mouth daily.   Fish Oil 875 MG Caps Take 875 mg by mouth 2 (two) times daily.   gabapentin 100 MG  capsule Commonly known as: NEURONTIN Take 1 capsule (100 mg total) by mouth 2 (two) times daily.   insulin degludec 200 UNIT/ML FlexTouch Pen Commonly known as: TRESIBA 32 Units at bedtime.   losartan 50 MG tablet Commonly known as: COZAAR Take 1 tablet (50 mg total) by mouth daily.   metFORMIN 500 MG tablet Commonly known as: GLUCOPHAGE Take 2 tablets (1,000 mg total) by mouth 2 (two) times daily.   metolazone 2.5 MG tablet Commonly known as: ZAROXOLYN Take 1 tablet (2.5 mg) by mouth once a week as needed for weight gain greater than 3 lbs   metoprolol succinate 50 MG 24 hr tablet Commonly known as: TOPROL-XL Take 1 tablet (50 mg total) by mouth daily. Take with or immediately following a meal.   pramipexole 1.5 MG tablet Commonly known as: MIRAPEX Take 1 tablet (1.5 mg total) by mouth 2 (two) times daily.   rosuvastatin 20 MG tablet Commonly known as: CRESTOR Take 1 tablet (20 mg total) by mouth daily.   spironolactone 25 MG tablet Commonly known as: ALDACTONE Take 1 tablet (25 mg total) by mouth daily.   torsemide 20 MG tablet Commonly known as: DEMADEX Take 2 tablets (40 mg total) by mouth daily. 40 mg daily   Zenpep 40000-126000 units Cpep Generic drug: Pancrelipase (Lip-Prot-Amyl) Take 80,000 Units by mouth 3 (three) times daily with meals AND 40,000 Units with snacks.               Discharge Care Instructions  (From admission, onward)           Start     Ordered   12/23/21 0000  Discharge wound care:       Comments: Per Dr Vickki Muff instructions   12/23/21 1048            Follow-up Information     [name], MD. Schedule an appointment as soon as possible for a visit in 1 week(s).   Specialties: Internal Medicine, Cardiology Why: hospital f/u Contact information: [place] [address] [phone]         [name], MD .   Specialties: Cardiology, Radiology Contact information: [place] [address] [phone]         Vickie Epley, MD .   Specialties: Cardiology, Radiology Contact information: [place] [address] [phone]         Caroline More, [place]. Schedule an appointment as soon as possible for a visit in 1 week(s).   Specialty: Podiatry Why: s/p left 2nd to amputation Contact information: [place] [address] [phone]                Discharge Exam: Danley Danker Weights   12/19/21 1714 12/22/21 1532  Weight: 98.4 kg 98.4 kg     Condition at discharge: fair  The results of significant diagnostics from this hospitalization (including imaging, microbiology, ancillary and laboratory) are listed below for reference.   Imaging  Studies: MR FOOT LEFT WO CONTRAST  Result Date: 12/21/2021 CLINICAL DATA:  Foot swelling, diabetic, osteomyelitis suspected, xray done. Second toe ulcer EXAM: MRI OF THE LEFT FOOT WITHOUT CONTRAST TECHNIQUE: Multiplanar, multisequence MR imaging of the left forefoot was performed. No intravenous contrast was administered. COMPARISON:  X-ray 12/20/2021 FINDINGS: Technical Note: Despite efforts by the technologist and patient, motion artifact is present on today's exam and could not be eliminated. This reduces exam sensitivity and specificity. Bones/Joint/Cartilage Mild bone marrow edema within the distal phalanx of the second toe without focal erosion. Preserved fatty T1 marrow signal at this location. Bone marrow edema at the second metatarsal base, and to a lesser degree at the third metatarsal base, favored reactive/degenerative secondary to mild-moderate tarsometatarsal joint osteoarthritis. Degenerative changes are also present at the first MTP joint. No fracture or dislocation. Mild hallux valgus alignment. Ligaments Intact Lisfranc ligament. Collateral ligaments of the forefoot appear intact. Muscles and Tendons Denervation changes of the intrinsic foot  musculature. No tenosynovial fluid collection. Soft tissues Subcutaneous edema over the dorsal forefoot. Mild soft tissue swelling of the second toe. No well-defined deep soft tissue ulceration. No organized fluid collection. IMPRESSION: 1. Mild bone marrow edema within the distal phalanx of the second toe. Findings are favored to represent changes of reactive osteitis. Early acute osteomyelitis would be difficult to exclude. 2. Subcutaneous edema over the dorsal forefoot and second toe, which may reflect cellulitis. No deep soft tissue ulceration or organized fluid collection. 3. Bone marrow edema at the second and third metatarsal heads, favored to be reactive/degenerative secondary to tarsometatarsal joint osteoarthritis. Electronically Signed   By: Davina Poke D.O.   On: 12/21/2021 13:05   US Venous Img Lower Unilateral Left (DVT)  Result Date: 12/22/2021 CLINICAL DATA:  Left lower extremity pain and edema. Gangrenous left toe. Evaluate for DVT. EXAM: LEFT LOWER EXTREMITY VENOUS DOPPLER ULTRASOUND TECHNIQUE: Gray-scale sonography with graded compression, as well as color Doppler and duplex ultrasound were performed to evaluate the lower extremity deep venous systems from the level of the common femoral vein and including the common femoral, femoral, profunda femoral, popliteal and calf veins including the posterior tibial, peroneal and gastrocnemius veins when visible. The superficial great saphenous vein was also interrogated. Spectral Doppler was utilized to evaluate flow at rest and with distal augmentation maneuvers in the common femoral, femoral and popliteal veins. COMPARISON:  None Available. FINDINGS: Contralateral Common Femoral Vein: Respiratory phasicity is normal and symmetric with the symptomatic side. No evidence of thrombus. Normal compressibility. Common Femoral Vein: No evidence of thrombus. Normal compressibility, respiratory phasicity and response to augmentation. Saphenofemoral  Junction: No evidence of thrombus. Normal compressibility and flow on color Doppler imaging. Profunda Femoral Vein: No evidence of thrombus. Normal compressibility and flow on color Doppler imaging. Femoral Vein: No evidence of thrombus. Normal compressibility, respiratory phasicity and response to augmentation. Popliteal Vein: No evidence of thrombus. Normal compressibility, respiratory phasicity and response to augmentation. Calf Veins: No evidence of thrombus. Normal compressibility and flow on color Doppler imaging. Superficial Great Saphenous Vein: No evidence of thrombus. Normal compressibility. Venous Reflux:  None. Other Findings:  None. IMPRESSION: No evidence of DVT within the left lower extremity. Electronically Signed   By: Sandi Mariscal M.D.   On: 12/22/2021 08:00   US ARTERIAL ABI (SCREENING LOWER EXTREMITY)  Result Date: 12/20/2021 CLINICAL DATA:  Hypertension Left vascular ulcer Hyperlipidemia Diabetes EXAM: NONINVASIVE PHYSIOLOGIC VASCULAR STUDY OF BILATERAL LOWER EXTREMITIES TECHNIQUE: Evaluation of both lower extremities were performed at rest,  including calculation of ankle-brachial indices with single level pressure measurements and doppler recording. COMPARISON:  None available FINDINGS: Right ABI:  1.08 Left ABI:  1.00 Right Lower Extremity: Multiphasic waveform of the posterior tibial and dorsalis pedis arteries. Left Lower Extremity: Monophasic waveform of the posterior tibial artery. Multiphasic waveform of the dorsalis pedis artery. 1.0-1.4 Normal IMPRESSION: Ankle brachial indices within normal limits. Electronically Signed   By: Miachel Roux M.D.   On: 12/20/2021 14:43   DG Chest Port 1 View  Result Date: 12/19/2021 CLINICAL DATA:  Possible sepsis EXAM: PORTABLE CHEST 1 VIEW COMPARISON:  11/11/2019 FINDINGS: Cardiac shadow is enlarged. Postsurgical changes are again seen and stable. The lungs are clear bilaterally. No acute bony abnormality is noted. IMPRESSION: No active disease.  Electronically Signed   By: Inez Catalina M.D.   On: 12/19/2021 18:32   DG Foot 2 Views Left  Result Date: 12/22/2021 CLINICAL DATA:  Postop EXAM: LEFT FOOT - 2 VIEW COMPARISON:  12/20/2021 FINDINGS: Interval partial amputation second digit at the level D IP joint. Vascular calcifications. Small plantar calcaneal spur IMPRESSION: Interval amputation of second digit at the DIP joint with expected surgical changes Electronically Signed   By: Donavan Foil M.D.   On: 12/22/2021 19:15   DG Foot 2 Views Left  Result Date: 12/20/2021 CLINICAL DATA:  LEFT foot pain and swelling.  No known injury. EXAM: LEFT FOOT - 2 VIEW COMPARISON:  None Available. FINDINGS: No acute fracture or dislocation identified. There is no evidence of acute osteomyelitis noted. Heavy vascular calcifications are present. Equivocal mild soft tissue swelling noted. IMPRESSION: 1. Equivocal mild soft tissue swelling without acute bony abnormality. 2. Heavy vascular calcifications. Electronically Signed   By: Margarette Canada M.D.   On: 12/20/2021 11:57   ECHOCARDIOGRAM COMPLETE  Result Date: 12/17/2021    ECHOCARDIOGRAM REPORT   Patient Name:   [name] Date of Exam: 12/17/2021 Medical Rec #:  [id number]               Height:       68.0 in Accession #:    [id number]              Weight:       217.1 lb Date of Birth:  11-13-1960              BSA:          2.116 m Patient Age:    62 years                BP:           120/60 mmHg Patient Gender: M                       HR:           71 bpm. Exam Location:  [facility] Procedure: 2D Echo, 3D Echo, Cardiac Doppler, Color Doppler and Strain Analysis Indications:    I50.20* Unspecified systolic (congestive) heart failure  History:        Patient has prior history of Echocardiogram examinations, most                 recent 10/29/2020. CHF and Cardiomyopathy, CAD, Prior CABG, PAD                 and Pulmonary HTN, Mitral Valve Disease, Arrythmias:SVT; Risk                  Factors:Hypertension, Diabetes  and Dyslipidemia.                  Mitral Valve: prosthetic annuloplasty ring valve is present in                 the mitral position. Procedure Date: 01/2018.  Sonographer:    Pilar Jarvis RDMS, RVT, RDCS Referring Phys: [id number] BRIAN AGBOR-ETANG IMPRESSIONS  1. Left ventricular ejection fraction, by estimation, is 45 to 50%. The left ventricle has mildly decreased function. The left ventricle demonstrates global hypokinesis. Left ventricular diastolic parameters are consistent with Grade II diastolic dysfunction (pseudonormalization).  2. Right ventricular systolic function is normal. The right ventricular size is mildly enlarged.  3. The mitral valve has been repaired/replaced. No evidence of mitral valve regurgitation. The mean mitral valve gradient is 3.5 mmHg. There is a prosthetic annuloplasty ring present in the mitral position. Procedure Date: 01/2018.  4. The aortic valve was not well visualized. Aortic valve regurgitation is not visualized.  5. The inferior vena cava is normal in size with <50% respiratory variability, suggesting right atrial pressure of 8 mmHg. FINDINGS  Left Ventricle: Left ventricular ejection fraction, by estimation, is 45 to 50%. The left ventricle has mildly decreased function. The left ventricle demonstrates global hypokinesis. Global longitudinal strain performed but not reported based on interpreter judgement due to suboptimal tracking. 3D left ventricular ejection fraction analysis performed but not reported based on interpreter judgement due to suboptimal tracking. The left ventricular internal cavity size was normal in size. There is no left ventricular hypertrophy. Left ventricular diastolic parameters are consistent with Grade II diastolic dysfunction (pseudonormalization). Right Ventricle: The right ventricular size is mildly enlarged. No increase in right ventricular wall thickness. Right ventricular systolic function is normal. Left Atrium:  Left atrial size was normal in size. Right Atrium: Right atrial size was normal in size. Pericardium: There is no evidence of pericardial effusion. Mitral Valve: The mitral valve has been repaired/replaced. No evidence of mitral valve regurgitation. There is a prosthetic annuloplasty ring present in the mitral position. Procedure Date: 01/2018. MV peak gradient, 10.4 mmHg. The mean mitral valve gradient is 3.5 mmHg. Tricuspid Valve: The tricuspid valve is normal in structure. Tricuspid valve regurgitation is not demonstrated. Aortic Valve: The aortic valve was not well visualized. Aortic valve regurgitation is not visualized. Aortic valve mean gradient measures 3.0 mmHg. Aortic valve peak gradient measures 5.8 mmHg. Aortic valve area, by VTI measures 2.72 cm. Pulmonic Valve: The pulmonic valve was not well visualized. Pulmonic valve regurgitation is not visualized. Aorta: The aortic root and ascending aorta are structurally normal, with no evidence of dilitation. Venous: The inferior vena cava is normal in size with less than 50% respiratory variability, suggesting right atrial pressure of 8 mmHg. IAS/Shunts: No atrial level shunt detected by color flow Doppler.  LEFT VENTRICLE PLAX 2D LVIDd:         5.70 cm      Diastology LVIDs:         4.60 cm      LV e' medial:    5.00 cm/s LV PW:         0.80 cm      LV E/e' medial:  29.0 LV IVS:        1.30 cm      LV e' lateral:   6.64 cm/s LVOT diam:     2.20 cm      LV E/e' lateral: 21.8 LV SV:  80 LV SV Index:   38 LVOT Area:     3.80 cm                              3D Volume EF: LV Volumes (MOD)            3D EF:        48 % LV vol d, MOD A2C: 148.0 ml LV EDV:       215 ml LV vol d, MOD A4C: 165.5 ml LV ESV:       112 ml LV vol s, MOD A2C: 98.4 ml  LV SV:        103 ml LV vol s, MOD A4C: 94.8 ml LV SV MOD A2C:     49.6 ml LV SV MOD A4C:     165.5 ml LV SV MOD BP:      62.0 ml RIGHT VENTRICLE            IVC RV Basal diam:  4.30 cm    IVC diam: 1.70 cm RV S prime:      4.33 cm/s TAPSE (M-mode): 1.8 cm LEFT ATRIUM             Index        RIGHT ATRIUM           Index LA diam:        4.60 cm 2.17 cm/m   RA Area:     18.10 cm LA Vol (A2C):   55.7 ml 26.32 ml/m  RA Volume:   53.60 ml  25.33 ml/m LA Vol (A4C):   64.1 ml 30.29 ml/m LA Biplane Vol: 62.4 ml 29.49 ml/m  AORTIC VALVE                    PULMONIC VALVE AV Area (Vmax):    2.83 cm     PV Vmax:       0.91 m/s AV Area (Vmean):   2.55 cm     PV Peak grad:  3.3 mmHg AV Area (VTI):     2.72 cm AV Vmax:           120.00 cm/s AV Vmean:          86.000 cm/s AV VTI:            0.293 m AV Peak Grad:      5.8 mmHg AV Mean Grad:      3.0 mmHg LVOT Vmax:         89.40 cm/s LVOT Vmean:        57.600 cm/s LVOT VTI:          0.210 m LVOT/AV VTI ratio: 0.72  AORTA Ao Root diam: 3.30 cm Ao Asc diam:  3.30 cm Ao Arch diam: 2.3 cm MITRAL VALVE                TRICUSPID VALVE MV Area (PHT): 3.68 cm     TR Peak grad:   30.9 mmHg MV Area VTI:   1.75 cm     TR Vmax:        278.00 cm/s MV Peak grad:  10.4 mmHg MV Mean grad:  3.5 mmHg     SHUNTS MV Vmax:       1.61 m/s     Systemic VTI:  0.21 m MV Vmean:      85.7 cm/s    Systemic Diam: 2.20 cm MV Decel  Time: 206 msec MV E velocity: 145.00 cm/s MV A velocity: 95.70 cm/s MV E/A ratio:  1.52 [name] MD Electronically signed by [name] MD Signature Date/Time: 12/17/2021/12:42:20 PM    Final     Microbiology: Results for orders placed or performed during the hospital encounter of 12/19/21  Resp Panel by RT-PCR (Flu A&B, Covid) Nasopharyngeal Swab     Status: None   Collection Time: 12/19/21  5:22 PM   Specimen: Nasopharyngeal Swab; Nasopharyngeal(NP) swabs in vial transport medium  Result Value Ref Range Status   SARS Coronavirus 2 by RT PCR NEGATIVE NEGATIVE Final    Comment: (NOTE) SARS-CoV-2 target nucleic acids are NOT DETECTED.  The SARS-CoV-2 RNA is generally detectable in upper respiratory specimens during the acute phase of infection. The  lowest concentration of SARS-CoV-2 viral copies this assay can detect is 138 copies/mL. A negative result does not preclude SARS-Cov-2 infection and should not be used as the sole basis for treatment or other patient management decisions. A negative result may occur with  improper specimen collection/handling, submission of specimen other than nasopharyngeal swab, presence of viral mutation(s) within the areas targeted by this assay, and inadequate number of viral copies(<138 copies/mL). A negative result must be combined with clinical observations, patient history, and epidemiological information. The expected result is Negative.  Fact Sheet for Patients:  EntrepreneurPulse.com.au  Fact Sheet for Healthcare Providers:  IncredibleEmployment.be  This test is no t yet approved or cleared by the [place] FDA and  has been authorized for detection and/or diagnosis of SARS-CoV-2 by FDA under an Emergency Use Authorization (EUA). This EUA will remain  in effect (meaning this test can be used) for the duration of the COVID-19 declaration under Section 564(b)(1) of the Act, 21 U.S.C.section 360bbb-3(b)(1), unless the authorization is terminated  or revoked sooner.       Influenza A by PCR NEGATIVE NEGATIVE Final   Influenza B by PCR NEGATIVE NEGATIVE Final    Comment: (NOTE) The Xpert Xpress SARS-CoV-2/FLU/RSV plus assay is intended as an aid in the diagnosis of influenza from Nasopharyngeal swab specimens and should not be used as a sole basis for treatment. Nasal washings and aspirates are unacceptable for Xpert Xpress SARS-CoV-2/FLU/RSV testing.  Fact Sheet for Patients: EntrepreneurPulse.com.au  Fact Sheet for Healthcare Providers: IncredibleEmployment.be  This test is not yet approved or cleared by the [place] FDA and has been authorized for detection and/or diagnosis of SARS-CoV-2 by FDA under  an Emergency Use Authorization (EUA). This EUA will remain in effect (meaning this test can be used) for the duration of the COVID-19 declaration under Section 564(b)(1) of the Act, 21 U.S.C. section 360bbb-3(b)(1), unless the authorization is terminated or revoked.  Performed at [facility], [address] [place], [place] [address]   Blood Culture (routine x 2)     Status: None (Preliminary result)   Collection Time: 12/19/21  5:22 PM   Specimen: BLOOD  Result Value Ref Range Status   Specimen Description BLOOD LEFT ANTECUBITAL  Final   Special Requests   Final    BOTTLES DRAWN AEROBIC AND ANAEROBIC Blood Culture adequate volume   Culture   Final    NO GROWTH 4 DAYS Performed at [facility], [address] [place], [place] [address]    Report Status PENDING  Incomplete  Blood Culture (routine x 2)     Status: None (Preliminary result)   Collection Time: 12/19/21  6:11 PM   Specimen: BLOOD  Result Value  Ref Range Status   Specimen Description BLOOD RIGHT ANTECUBITAL  Final   Special Requests   Final    BOTTLES DRAWN AEROBIC AND ANAEROBIC Blood Culture results may not be optimal due to an excessive volume of blood received in culture bottles   Culture   Final    NO GROWTH 4 DAYS Performed at [facility], [address] [place], [place] [address]    Report Status PENDING  Incomplete  Aerobic/Anaerobic Culture w Gram Stain (surgical/deep wound)     Status: None (Preliminary result)   Collection Time: 12/20/21 11:31 AM   Specimen: Toe; Wound  Result Value Ref Range Status   Specimen Description   Final    TOE LEFT Performed at [facility], [address] [place], [place] [address]    Special Requests   Final    NONE Performed at [facility], [place], [place] [address]    Gram Stain   Final    NO SQUAMOUS EPITHELIAL CELLS SEEN RARE WBC SEEN RARE GRAM POSITIVE COCCI Performed at [facility], [place] [address] [place], [place] [address]    Culture   Final    FEW STAPHYLOCOCCUS AUREUS FEW STREPTOCOCCUS PYOGENES Beta hemolytic streptococci are predictably susceptible to penicillin and other beta lactams. Susceptibility testing not routinely performed. NO ANAEROBES ISOLATED; CULTURE IN PROGRESS FOR 5 DAYS    Report Status PENDING  Incomplete  Urine Culture     Status: None   Collection Time: 12/21/21  4:15 AM   Specimen: Urine, Clean Catch  Result Value Ref Range Status   Specimen Description   Final    URINE, CLEAN CATCH Performed at [facility], [address], [place], [place] [address]    Special Requests   Final    NONE Performed at [facility], [address], [place], [place] [address]    Culture   Final    NO GROWTH Performed at [facility], [place] [address], [place], [place] [address]    Report Status 12/22/2021 FINAL  Final  MRSA Next Gen by PCR, Nasal     Status: None   Collection Time: 12/21/21  4:15 AM   Specimen: Nasal Mucosa; Nasal Swab  Result Value Ref Range Status   MRSA by PCR Next Gen NOT DETECTED NOT DETECTED Final    Comment: (NOTE) The GeneXpert MRSA Assay (FDA approved for NASAL specimens only), is one component of a comprehensive MRSA colonization surveillance program. It is not intended to diagnose MRSA infection nor to guide or monitor treatment for MRSA infections. Test performance is not FDA approved in patients less than 69 years old. Performed at [facility], [address] [place], [place] [address]     Labs: CBC: Recent Labs  Lab 12/19/21 1727 12/20/21 0548  WBC 25.9* 19.6*  NEUTROABS 23.6*  --   HGB 12.8* 10.4*  HCT 38.6* 31.7*  MCV 90.2 89.8  PLT 205 124*   Basic Metabolic Panel: Recent Labs  Lab 12/19/21 1727 12/20/21 0548  NA 135 136  K 4.8 4.4  CL 100 102  CO2 25 25  GLUCOSE 190* 142*  BUN 40* 42*  CREATININE 1.93* 1.80*  CALCIUM 8.9 8.2*   Liver Function  Tests: Recent Labs  Lab 12/19/21 1727  AST 22  ALT 22  ALKPHOS 48  BILITOT 0.8  PROT 7.7  ALBUMIN 4.0   CBG: Recent Labs  Lab 12/22/21 1218 12/22/21 1542 12/22/21 1720  12/22/21 2235 12/23/21 0748  GLUCAP 108* 99 91 164* 114*    Discharge time spent: greater than 30 minutes.  Signed: [name], MD [facility] 12/23/2021

## 2021-12-23 NOTE — Progress Notes (Signed)
F/u partial toe amputation left 2nd toe.   Wound is stable.  No dehiscence. Dressing changed. WB in post op shoe.  Minimize WB as much as possible. F/U in outpt clinic in 1 week with Dr. Luana Shu. PO abx for 5 days recommended. OK to d/c from podiatry standpoint.

## 2021-12-23 NOTE — TOC Initial Note (Signed)
Transition of Care [facility]) - Initial/Assessment Note    Patient Details  Name: Ricardo Brown MRN: [id number] Date of Birth: [birth year]  Transition of Care [facility]) CM/SW Contact:    Conception Oms, RN Phone Number: 12/23/2021, 12:17 PM  Clinical Narrative:                  Transition of Care [facility]) Screening Note   Patient Details  Name: Ricardo Brown Date of Birth: Aug 02, 1961   Transition of Care [facility]) CM/SW Contact:    Conception Oms, RN Phone Number: 12/23/2021, 12:17 PM    Transition of Care Department [facility]) has reviewed patient and no TOC needs have been identified at this time. We will continue to monitor patient advancement through interdisciplinary progression rounds. If new patient transition needs arise, please place a TOC consult.          Patient Goals and CMS Choice        Expected Discharge Plan and Services           Expected Discharge Date: 12/23/21                                    Prior Living Arrangements/Services                       Activities of Daily Living Home Assistive Devices/Equipment: None ADL Screening (condition at time of admission) Patient's cognitive ability adequate to safely complete daily activities?: Yes Is the patient deaf or have difficulty hearing?: No Does the patient have difficulty seeing, even when wearing glasses/contacts?: No Does the patient have difficulty concentrating, remembering, or making decisions?: No Patient able to express need for assistance with ADLs?: Yes Does the patient have difficulty dressing or bathing?: No Independently performs ADLs?: Yes (appropriate for developmental age) Does the patient have difficulty walking or climbing stairs?: Yes Weakness of Legs: Both Weakness of Arms/Hands: None  Permission Sought/Granted                  Emotional Assessment              Admission diagnosis:  Severe sepsis with acute organ dysfunction  ([place]) [A41.9, R65.20] Cellulitis, unspecified cellulitis site [L03.90] Sepsis ([place]) [A41.9] Patient Active Problem List   Diagnosis Date Noted   Sepsis ([place]) 12/20/2021   Severe sepsis with acute organ dysfunction ([place]) 12/19/2021   Morbid obesity ([place]) 12/19/2021   Cellulitis of left lower extremity 12/19/2021   AKI (acute kidney injury) ([place]) 12/19/2021   Stage 3a chronic kidney disease (CKD) ([place]) 12/19/2021   Dermatitis 12/04/2021   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 08/28/2021   Chronic combined systolic (congestive) and diastolic (congestive) heart failure ([place]) 08/28/2021   PAD (peripheral artery disease) ([place]) 08/28/2021   Restless leg syndrome 05/21/2021   History of colonic polyps    Lipoma of colon    Screening for colon cancer    Polyp of colon    Coronary artery disease 05/31/2019   PSVT (paroxysmal supraventricular tachycardia) ([place]) 03/22/2018   Diabetes mellitus type 2, uncomplicated ([place]) 02/63/7858   HLD (hyperlipidemia) 03/07/2018   Hypertension 03/07/2018   Right ventricular dysfunction 02/02/2018   Postoperative atrial fibrillation ([place]) 02/01/2018   S/P CABG x 3 01/28/2018   PCP:  [name], MD Pharmacy:   [facility] [place] [address]  [place] [address] Phone: [phone] Fax: [place], [place] [address] Phone: [phone] Fax: [phone]     Social Determinants of Health (SDOH) Interventions    Readmission Risk Interventions     View : No data to display.

## 2021-12-23 NOTE — Evaluation (Signed)
Physical Therapy Evaluation Patient Details Name: JABEN BENEGAS MRN: [id number] DOB: 03/16/1961 Today's Date: 12/23/2021  History of Present Illness  61 y/o male s/p L 2nd toe partial amputation 12/22/21.  Clinical Impression  Pt did well with mobility, easily ambulated >100 ft and negotiated steps with walker.  He did need education and cuing to insure he respects the [place] limitations he has at this point and to encourage consistent post-op shoe and AD use.  Pt reports he has plenty of assist available as needed but had a similar sx on the contralateral side 4-5 months ago and did well.  Pt likely will not need further PT follow up at home (per surgeon recs) and has appropriate equipment at home.       Recommendations for follow up therapy are one component of a multi-disciplinary discharge planning process, led by the attending physician.  Recommendations may be updated based on patient status, additional functional criteria and insurance authorization.  Follow Up Recommendations Follow physician's recommendations for discharge plan and follow up therapies    Assistance Recommended at Discharge Intermittent Supervision/Assistance  Patient can return home with the following  Assist for transportation;Assistance with cooking/housework    Equipment Recommendations None recommended by PT  Recommendations for Other Services       Functional Status Assessment Patient has had a recent decline in their functional status and demonstrates the ability to make significant improvements in function in a reasonable and predictable amount of time.     Precautions / Restrictions Restrictions Weight Bearing Restrictions: Yes LLE Weight Bearing: Partial weight bearing      Mobility  Bed Mobility Overal bed mobility: Independent                  Transfers Overall transfer level: Independent Equipment used: Rolling walker (2 wheels)               General transfer  comment: Pt able to rise to standing w/o assist or AD, was able to put all weight in the L heel, but encouraged him to insure he used an AD to take some weight off regardless    Ambulation/Gait Ambulation/Gait assistance: Supervision Gait Distance (Feet): 125 Feet Assistive device: Rolling walker (2 wheels)         General Gait Details: Pt was able to easily and confidently ambulate with walker and PWBing.  He did not display any safety concerns apart from possibly being too confident - PT continued to stress the need to let his incision/surgical site heal and respect PWBing precautions.  Stairs Stairs: Yes Stairs assistance: Supervision Stair Management: No rails, Backwards, Forwards Number of Stairs: 4 General stair comments: performed and educated retro strategy as well as forward with AD on landing strategy - again all stressing with need to let his foot feel and insure he does not put too much weight on it.  Wheelchair Mobility    Modified Rankin (Stroke Patients Only)       Balance Overall balance assessment: Modified Independent                                           Pertinent Vitals/Pain Pain Assessment Pain Assessment: 0-10 Pain Score: 1  Pain Location: L foot    Home Living Family/patient expects to be discharged to:: Private residence Living Arrangements: Spouse/significant other;Children;Other relatives Available Help at Discharge: Available PRN/intermittently  Home Access: Stairs to enter Entrance Stairs-Rails: None (landing large enough for RW) Entrance Stairs-Number of Steps: 2   Home Layout: One level Home Equipment: Conservation officer, nature (2 wheels);Cane - single point      Prior Function Prior Level of Function : Independent/Modified Independent             Mobility Comments: Pt normally stays active, dealt with similar (R 2nd toe amputation) ~5 months ago with good results       Hand Dominance        Extremity/Trunk  Assessment   Upper Extremity Assessment Upper Extremity Assessment: Overall WFL for tasks assessed    Lower Extremity Assessment Lower Extremity Assessment: Overall WFL for tasks assessed       Communication   Communication: No difficulties  Cognition Arousal/Alertness: Awake/alert Behavior During Therapy: WFL for tasks assessed/performed Overall Cognitive Status: Within Functional Limits for tasks assessed                                          General Comments General comments (skin integrity, edema, etc.): good confidence, educated on importance of PWBing/wound healing    Exercises     Assessment/Plan    PT Assessment Patient needs continued PT services  PT Problem List Decreased strength;Decreased activity tolerance;Decreased balance;Decreased safety awareness;Decreased knowledge of use of DME;Decreased knowledge of precautions;Pain       PT Treatment Interventions DME instruction;Stair training;Gait training;Functional mobility training;Therapeutic activities;Therapeutic exercise;Balance training;Neuromuscular re-education;Patient/family education    PT Goals (Current goals can be found in the Care Plan section)  Acute Rehab PT Goals Patient Stated Goal: go home today PT Goal Formulation: With patient Time For Goal Achievement: 01/06/22 Potential to Achieve Goals: Good    Frequency 7X/week     Co-evaluation               AM-PAC PT "6 Clicks" Mobility  Outcome Measure Help needed turning from your back to your side while in a flat bed without using bedrails?: None Help needed moving from lying on your back to sitting on the side of a flat bed without using bedrails?: None Help needed moving to and from a bed to a chair (including a wheelchair)?: None Help needed standing up from a chair using your arms (e.g., wheelchair or bedside chair)?: None Help needed to walk in hospital room?: None Help needed climbing 3-5 steps with a railing? :  None 6 Click Score: 24    End of Session Equipment Utilized During Treatment: Gait belt Activity Tolerance: Patient tolerated treatment well Patient left: in chair;with call bell/phone within reach Nurse Communication: Mobility status PT Visit Diagnosis: Difficulty in walking, not elsewhere classified (R26.2);Pain Pain - Right/Left: Left Pain - part of body: Ankle and joints of foot    Time: 0945-1010 PT Time Calculation (min) (ACUTE ONLY): 25 min   Charges:   PT Evaluation $PT Eval Low Complexity: 1 Low PT Treatments $Gait Training: 8-22 mins        Kreg Shropshire, DPT 12/23/2021, 11:16 AM

## 2021-12-23 NOTE — Anesthesia Postprocedure Evaluation (Signed)
Anesthesia Post Note  Patient: Ricardo Brown  Procedure(s) Performed: AMPUTATION TOE-2nd Toe Partial (Left: Toe)  Patient location during evaluation: PACU Anesthesia Type: General Level of consciousness: awake and alert Pain management: pain level controlled Vital Signs Assessment: post-procedure vital signs reviewed and stable Respiratory status: spontaneous breathing, nonlabored ventilation, respiratory function stable and patient connected to nasal cannula oxygen Cardiovascular status: blood pressure returned to baseline and stable Postop Assessment: no apparent nausea or vomiting Anesthetic complications: no   No notable events documented.   Last Vitals:  Vitals:   12/22/21 2110 12/23/21 0352  BP: (!) 104/56 (!) 114/58  Pulse: 70 70  Resp: 17 19  Temp: 37.1 C 37.3 C  SpO2: 94% 98%    Last Pain:  Vitals:   12/22/21 2328  TempSrc:   PainSc: 0-No pain                 Martha Clan

## 2021-12-24 LAB — CULTURE, BLOOD (ROUTINE X 2)
Culture: NO GROWTH
Culture: NO GROWTH
Special Requests: ADEQUATE

## 2021-12-24 LAB — SURGICAL PATHOLOGY

## 2021-12-25 LAB — AEROBIC/ANAEROBIC CULTURE W GRAM STAIN (SURGICAL/DEEP WOUND): Gram Stain: NONE SEEN

## 2021-12-26 ENCOUNTER — Other Ambulatory Visit: Payer: Self-pay | Admitting: Podiatry

## 2021-12-26 ENCOUNTER — Encounter: Payer: Self-pay | Admitting: Cardiology

## 2021-12-26 ENCOUNTER — Ambulatory Visit (INDEPENDENT_AMBULATORY_CARE_PROVIDER_SITE_OTHER): Payer: 59 | Admitting: Cardiology

## 2021-12-26 VITALS — BP 112/60 | HR 67 | Ht 68.0 in | Wt 206.6 lb

## 2021-12-26 DIAGNOSIS — Z951 Presence of aortocoronary bypass graft: Secondary | ICD-10-CM | POA: Diagnosis not present

## 2021-12-26 DIAGNOSIS — I5020 Unspecified systolic (congestive) heart failure: Secondary | ICD-10-CM

## 2021-12-26 DIAGNOSIS — I10 Essential (primary) hypertension: Secondary | ICD-10-CM

## 2021-12-26 DIAGNOSIS — I471 Supraventricular tachycardia: Secondary | ICD-10-CM

## 2021-12-26 MED ORDER — LOSARTAN POTASSIUM 25 MG PO TABS: 25.0000 mg | ORAL_TABLET | Freq: Every day | ORAL | 3 refills | Status: DC

## 2021-12-26 NOTE — Patient Instructions (Addendum)
Medication Instructions:  Decrease Losartan to 25 mg daily   *If you need a refill on your cardiac medications before your next appointment, please call your pharmacy*   Lab Work: None ordered   If you have labs (blood work) drawn today and your tests are completely normal, you will receive your results only by: [place] (if you have MyChart) OR A paper copy in the mail If you have any lab test that is abnormal or we need to change your treatment, we will call you to review the results.   Testing/Procedures: None ordered    Follow-Up: At [facility], you and your health needs are our priority.  As part of our continuing mission to provide you with exceptional heart care, we have created designated Provider Care Teams.  These Care Teams include your primary Cardiologist (physician) and Advanced Practice Providers (APPs -  Physician Assistants and Nurse Practitioners) who all work together to provide you with the care you need, when you need it.  We recommend signing up for the patient portal called "MyChart".  Sign up information is provided on this After Visit Summary.  MyChart is used to connect with patients for Virtual Visits (Telemedicine).  Patients are able to view lab/test results, encounter notes, upcoming appointments, etc.  Non-urgent messages can be sent to your provider as well.   To learn more about what you can do with MyChart, go to [email or web address].    Your next appointment:   4 month(s)  The format for your next appointment:   In Person  Provider:   You may see [name], MD or one of the following Advanced Practice Providers on your designated Care Team:   Murray Hodgkins, NP Christell Faith, PA-C Cadence Kathlen Mody, [place]    Other Instructions   Important Information About [place]

## 2021-12-26 NOTE — Progress Notes (Signed)
Cardiology Office Note:    Date:  12/26/2021   ID:  [name], DOB 05-11-1961, MRN [id number]  PCP:  [name], MD  Cardiologist:  [name], MD  Electrophysiologist:  Vickie Epley, MD   Referring MD: [name], MD   No chief complaint on file.    History of Present Illness:    GRAESYN SCHREIFELS III is a 61 y.o. male with a hx of SVT on amio , CAD/CABG x3 in 2019, HFrEF EF 40-45%, hypertension, hyperlipidemia, diabetes, CKD-3 who presents for follow-up.   Recently admitted to the hospital with left second toe osteomyelitis and sepsis s/p amputation.  Echocardiogram obtained 12/17/2021 showed EF 45 to 50%, slightly improved from prior.  Still has shortness of breath with exertion, states having occasional low blood pressures at home.  Denies palpitations, takes amiodarone 200 mg daily.  PCP made appointment with pulmonary medicine due to persistent shortness of breath.  Followed up with nephrology.  States having adequate diuresing when he takes torsemide and metolazone.  Last kidney function shows stable creatinine.   Prior notes Echo 10/2020 EF 40 to 17%, grade 2 diastolic dysfunction. Patient was seen in the [place] in 06/ 2019 due to nausea, weakness, myalgias.  EKG at the time showed inferior ST elevation with reciprocal changes in the anterior leads and small inferior Q waves.  Patient was deemed late presenting STEMI.   Left heart cath was performed, briefly showed occluded distal RCA, 90% mid LAD, 90% proximal left circumflex.  Due to patient being a diabetic, and left heart cath showing diffuse three-vessel disease, CABG was recommended.  He underwent coronary artery bypass grafting x3 in July 2019 at [facility].  Delene Loll previously tried on patient, he did not tolerate.  He developed feeling of unwell, went into SVT and was taken to the emergency room.  Patient with history of SVT, who was seen in the emergency room and adenosine  given x2 and placed on amiodarone  Echo on 09/2019 showed mild to moderately reduced ejection fraction, EF 40 to 45%.  Past Medical History:  Diagnosis Date   Chronic combined systolic (congestive) and diastolic (congestive) heart failure ([place])    a. 01/2018 EF 35-40%; b. 10/2020 Echo: EF 40-45%, glob HK. RVSP 42.73mHg. Mildly dil LA. Mild MS/AoV sclerosis.   CKD (chronic kidney disease), stage III (HCC)    Coronary artery disease    a. 01/2018 late presenting inferior MI; b. 01/2018 Cath: Severe multivessel dzs-->CABG x 3 (LIMA->LAD, VG->OM2, VG->RPDA @ [facility]).   Diabetes mellitus without complication ([place]    a. Dx ~ 2000.   Heart palpitations    a. Pt reports prior nl echo's and stress tests. Last stress test w/in past 2 yrs - PCP.   High cholesterol    Hypertension    Mitral regurgitation    a. 01/2018 s/p MV repair @ time of CABG.   Myocardial infarction ([facility]    Postoperative atrial fibrillation    a. 01/2018 @ time of CABG.   PSVT (paroxysmal supraventricular tachycardia) ([place]    a. Very symptomatic with multiple ED evaluations.  Has been on amio.   STEMI (ST elevation myocardial infarction) ([place] 01/24/2018    Past Surgical History:  Procedure Laterality Date   AMPUTATION TOE Right 08/29/2021   Procedure: AMPUTATION TOE-2nd toe;  Surgeon: CSharlotte Alamo DPM;  Location: [facility] ORS;  Service: Podiatry;  Laterality: Right;   AMPUTATION TOE Left 12/22/2021   Procedure: AMPUTATION TOE-2nd Toe Partial;  Surgeon:  Caroline More, DPM;  Location: [facility] ORS;  Service: Podiatry;  Laterality: Left;   CARDIAC CATHETERIZATION     CARDIAC VALVE REPLACEMENT     Mitral Valve Repair   COLONOSCOPY WITH PROPOFOL N/A 06/07/2019   Procedure: COLONOSCOPY WITH PROPOFOL;  Surgeon: [name], MD;  Location: [facility] ENDOSCOPY;  Service: Endoscopy;  Laterality: N/A;   COLONOSCOPY WITH PROPOFOL N/A 02/07/2020   Procedure: COLONOSCOPY WITH PROPOFOL;  Surgeon: [name], MD;  Location: [facility]  ENDOSCOPY;  Service: Endoscopy;  Laterality: N/A;   COLONOSCOPY WITH PROPOFOL N/A 12/11/2020   Procedure: COLONOSCOPY WITH PROPOFOL;  Surgeon: [name], MD;  Location: [facility] ENDOSCOPY;  Service: Endoscopy;  Laterality: N/A;   CORONARY ANGIOPLASTY     CORONARY ARTERY BYPASS GRAFT     x 3   01/2018   ESOPHAGOGASTRODUODENOSCOPY N/A 02/07/2020   Procedure: ESOPHAGOGASTRODUODENOSCOPY (EGD);  Surgeon: [name], MD;  Location: [facility] ENDOSCOPY;  Service: Endoscopy;  Laterality: N/A;   LEFT HEART CATH AND CORONARY ANGIOGRAPHY N/A 01/24/2018   Procedure: LEFT HEART CATH AND CORONARY ANGIOGRAPHY;  Surgeon: [name], MD;  Location: [place] CV LAB;  Service: Cardiovascular;  Laterality: N/A;   SVT ABLATION N/A 09/12/2021   Procedure: SVT ABLATION;  Surgeon: Vickie Epley, MD;  Location: [place] CV LAB;  Service: Cardiovascular;  Laterality: N/A;   TONSILLECTOMY      Current Medications: Current Meds  Medication Sig   aspirin 81 MG EC tablet Take 81 mg by mouth daily.    b complex vitamins tablet Take 1 tablet by mouth daily.    clotrimazole-betamethasone (LOTRISONE) cream Apply 1 application. topically 2 (two) times daily.   doxycycline (VIBRA-TABS) 100 MG tablet Take 1 tablet (100 mg total) by mouth every 12 (twelve) hours for 7 days.   Dulaglutide 1.5 MG/0.5ML SOPN Inject 1.5 mg into the skin every Saturday.   empagliflozin (JARDIANCE) 25 MG TABS tablet Take 25 mg by mouth daily.   gabapentin (NEURONTIN) 100 MG capsule Take 1 capsule (100 mg total) by mouth 2 (two) times daily.   insulin degludec (TRESIBA) 200 UNIT/ML FlexTouch Pen 32 Units at bedtime.   losartan (COZAAR) 25 MG tablet Take 1 tablet (25 mg total) by mouth daily.   metFORMIN (GLUCOPHAGE) 500 MG tablet Take 2 tablets (1,000 mg total) by mouth 2 (two) times daily.   metolazone (ZAROXOLYN) 2.5 MG tablet Take 1 tablet (2.5 mg) by mouth once a week as needed for weight gain greater than 3 lbs    metoprolol succinate (TOPROL-XL) 50 MG 24 hr tablet Take 1 tablet (50 mg total) by mouth daily. Take with or immediately following a meal.   Omega-3 Fatty Acids (FISH OIL) 875 MG CAPS Take 875 mg by mouth 2 (two) times daily.   Pancrelipase, Lip-Prot-Amyl, (ZENPEP) 40000-126000 units CPEP Take 80,000 Units by mouth 3 (three) times daily with meals AND 40,000 Units with snacks.   pramipexole (MIRAPEX) 1.5 MG tablet Take 1 tablet (1.5 mg total) by mouth 2 (two) times daily.   rosuvastatin (CRESTOR) 20 MG tablet Take 1 tablet (20 mg total) by mouth daily.   spironolactone (ALDACTONE) 25 MG tablet Take 1 tablet (25 mg total) by mouth daily.   torsemide (DEMADEX) 20 MG tablet Take 2 tablets (40 mg total) by mouth daily. 40 mg daily   [DISCONTINUED] losartan (COZAAR) 50 MG tablet Take 1 tablet (50 mg total) by mouth daily.     Allergies:   Entresto [sacubitril-valsartan], Lipitor [atorvastatin], and Pregabalin  Social History   Socioeconomic History   Marital status: Married    Spouse name: Not on file   Number of children: Not on file   Years of education: Not on file   Highest education level: Not on file  Occupational History   Not on file  Tobacco Use   Smoking status: Never   Smokeless tobacco: Never  Vaping Use   Vaping Use: Never used  Substance and Sexual Activity   Alcohol use: Not Currently    Comment: rare beer   Drug use: No   Sexual activity: Not on file  Other Topics Concern   Not on file  Social History Narrative   Lives locally with wife.  Works in [employer].  Does not routinely exercise.   Social Determinants of Health   Financial Resource Strain: Not on file  Food Insecurity: Not on file  Transportation Needs: Not on file  Physical Activity: Not on file  Stress: Not on file  Social Connections: Not on file     Family History: The patient's family history includes CAD in his father; Cancer in his father and mother; Other in his brother.  ROS:   Please  see the history of present illness.     All other systems reviewed and are negative.  EKGs/Labs/Other Studies Reviewed:    The following studies were reviewed today:   EKG:  EKG is ordered today.  EKG shows normal sinus rhythm, right bundle branch block.  Recent Labs: 07/11/2021: TSH 2.780 08/30/2021: Magnesium 2.2 12/19/2021: ALT 22; B Natriuretic Peptide 160.0 12/20/2021: BUN 42; Creatinine, Ser 1.80; Hemoglobin 10.4; Platelets 139; Potassium 4.4; Sodium 136  Recent Lipid Panel    Component Value Date/Time   CHOL 122 09/30/2020 1114   TRIG 121 09/30/2020 1114   HDL 35 (L) 09/30/2020 1114   CHOLHDL 3.5 09/30/2020 1114   VLDL 24 01/24/2018 1459   LDLCALC 67 09/30/2020 1114    Physical Exam:    VS:  BP 112/60   Pulse 67   Ht '5\' 8"'$  (1.727 m)   Wt 206 lb 9.6 oz (93.7 kg)   SpO2 99%   BMI 31.41 kg/m     Wt Readings from Last 3 Encounters:  12/26/21 206 lb 9.6 oz (93.7 kg)  12/22/21 216 lb 14.9 oz (98.4 kg)  12/04/21 217 lb 1.6 oz (98.5 kg)     GEN:  Well nourished, well developed in no acute distress HEENT: Normal NECK: No JVD; No carotid bruits CARDIAC: RRR, no murmurs, rubs, gallops RESPIRATORY:  Clear to auscultation without rales, wheezing or rhonchi  ABDOMEN: Soft, non-tender, non-distended MUSCULOSKELETAL:  1+ edema; No deformity  SKIN: Warm and dry NEUROLOGIC:  Alert and oriented x 3 PSYCHIATRIC:  Normal affect   ASSESSMENT:    1. Hx of CABG   2. HFrEF (heart failure with reduced ejection fraction) ([place])   3. Primary hypertension   4. SVT (supraventricular tachycardia) (HCC)    PLAN:    In order of problems listed above:  CAD status post CABG x3 in 2019. Denies chest pain.  Continue aspirin and Crestor. ICM, last echo with improvement in EF now 45 to 50% describes NYHA class II-III symptoms.  Continue metolazone 2.5 mg daily, Aldactone 25 daily, Toprol-XL 50 torsemide 40 mg daily.  Reduce losartan to 25 mg due to low BP.   Did not tolerate Entresto in  the past, went into SVT. Hypertension, BP low normal , lower at home.  Reduce losartan to 25 mg  daily.  Continue Toprol-XL, Aldactone 25 mg daily, torsemide.  SVT, currently in sinus rhythm on amiodarone.  Continue amiodarone 200 mg daily.  Follow-up in 4 months  Total encounter time 40 minutes  Greater than 50% was spent in counseling and coordination of care with the patient   Medication Adjustments/Labs and Tests Ordered: Current medicines are reviewed at length with the patient today.  Concerns regarding medicines are outlined above.  Orders Placed This Encounter  Procedures   EKG 12-Lead     Meds ordered this encounter  Medications   losartan (COZAAR) 25 MG tablet    Sig: Take 1 tablet (25 mg total) by mouth daily.    Dispense:  90 tablet    Refill:  3      Patient Instructions  Medication Instructions:  Decrease Losartan to 25 mg daily   *If you need a refill on your cardiac medications before your next appointment, please call your pharmacy*   Lab Work: None ordered   If you have labs (blood work) drawn today and your tests are completely normal, you will receive your results only by: [place] (if you have MyChart) OR A paper copy in the mail If you have any lab test that is abnormal or we need to change your treatment, we will call you to review the results.   Testing/Procedures: None ordered    Follow-Up: At [facility], you and your health needs are our priority.  As part of our continuing mission to provide you with exceptional heart care, we have created designated Provider Care Teams.  These Care Teams include your primary Cardiologist (physician) and Advanced Practice Providers (APPs -  Physician Assistants and Nurse Practitioners) who all work together to provide you with the care you need, when you need it.  We recommend signing up for the patient portal called "MyChart".  Sign up information is provided on this After Visit Summary.   MyChart is used to connect with patients for Virtual Visits (Telemedicine).  Patients are able to view lab/test results, encounter notes, upcoming appointments, etc.  Non-urgent messages can be sent to your provider as well.   To learn more about what you can do with MyChart, go to [email or web address].    Your next appointment:   4 month(s)  The format for your next appointment:   In Person  Provider:   You may see [name], MD or one of the following Advanced Practice Providers on your designated Care Team:   Murray Hodgkins, NP Christell Faith, PA-C Cadence Kathlen Mody, [place]    Other Instructions   Important Information About Sugar         Signed, [name], MD  12/26/2021 5:06 PM    [place]

## 2022-01-05 ENCOUNTER — Encounter: Payer: Self-pay | Admitting: Internal Medicine

## 2022-01-05 ENCOUNTER — Encounter: Payer: Self-pay | Admitting: *Deleted

## 2022-01-05 ENCOUNTER — Ambulatory Visit (INDEPENDENT_AMBULATORY_CARE_PROVIDER_SITE_OTHER): Payer: 59 | Admitting: Internal Medicine

## 2022-01-05 VITALS — BP 140/77 | HR 65 | Ht 68.0 in | Wt 206.5 lb

## 2022-01-05 DIAGNOSIS — Z794 Long term (current) use of insulin: Secondary | ICD-10-CM | POA: Diagnosis not present

## 2022-01-05 DIAGNOSIS — I5042 Chronic combined systolic (congestive) and diastolic (congestive) heart failure: Secondary | ICD-10-CM

## 2022-01-05 DIAGNOSIS — E119 Type 2 diabetes mellitus without complications: Secondary | ICD-10-CM

## 2022-01-05 DIAGNOSIS — G2581 Restless legs syndrome: Secondary | ICD-10-CM

## 2022-01-05 DIAGNOSIS — I739 Peripheral vascular disease, unspecified: Secondary | ICD-10-CM

## 2022-01-05 DIAGNOSIS — I10 Essential (primary) hypertension: Secondary | ICD-10-CM

## 2022-01-05 LAB — GLUCOSE, POCT (MANUAL RESULT ENTRY): POC Glucose: 135 mg/dl — AB (ref 70–99)

## 2022-01-05 NOTE — Assessment & Plan Note (Signed)
Chest is clear heart is regular 

## 2022-01-05 NOTE — Assessment & Plan Note (Signed)
Patient is moderately obese I told him to lose weight.

## 2022-01-05 NOTE — Assessment & Plan Note (Signed)
Blood pressure today is 135.  Patient is on insulin therapy

## 2022-01-05 NOTE — Assessment & Plan Note (Signed)
Patient has trouble driving the car because of nervousness restless leg syndrome and autonomic neuropathy

## 2022-01-05 NOTE — Assessment & Plan Note (Signed)
Blood pressure is 144 / 77, this is a mildly elevated blood pressure at rest

## 2022-01-05 NOTE — Progress Notes (Signed)
Established Patient Office Visit  Subjective:  Patient ID: Ricardo Brown, male    DOB: Oct 24, 1960  Age: 61 y.o. MRN: [id number]  CC:  Chief Complaint  Patient presents with   Diabetes   disability paper work    Diabetes Associated symptoms include chest pain and fatigue.   Ricardo Brown presents for disability  evaluation  Past Medical History:  Diagnosis Date   Chronic combined systolic (congestive) and diastolic (congestive) heart failure ([place])    a. 01/2018 EF 35-40%; b. 10/2020 Echo: EF 40-45%, glob HK. RVSP 42.61mHg. Mildly dil LA. Mild MS/AoV sclerosis.   CKD (chronic kidney disease), stage Brown (HCC)    Coronary artery disease    a. 01/2018 late presenting inferior MI; b. 01/2018 Cath: Severe multivessel dzs-->CABG x 3 (LIMA->LAD, VG->OM2, VG->RPDA @ [facility]).   Diabetes mellitus without complication ([place]    a. Dx ~ 2000.   Heart palpitations    a. Pt reports prior nl echo's and stress tests. Last stress test w/in past 2 yrs - PCP.   High cholesterol    Hypertension    Mitral regurgitation    a. 01/2018 s/p MV repair @ time of CABG.   Myocardial infarction ([facility]    Postoperative atrial fibrillation    a. 01/2018 @ time of CABG.   PSVT (paroxysmal supraventricular tachycardia) ([place]    a. Very symptomatic with multiple ED evaluations.  Has been on amio.   STEMI (ST elevation myocardial infarction) ([place] 01/24/2018    Past Surgical History:  Procedure Laterality Date   AMPUTATION TOE Right 08/29/2021   Procedure: AMPUTATION TOE-2nd toe;  Surgeon: CSharlotte Alamo DPM;  Location: [facility] ORS;  Service: Podiatry;  Laterality: Right;   AMPUTATION TOE Left 12/22/2021   Procedure: AMPUTATION TOE-2nd Toe Partial;  Surgeon: BCaroline More DPM;  Location: [facility] ORS;  Service: Podiatry;  Laterality: Left;   CARDIAC CATHETERIZATION     CARDIAC VALVE REPLACEMENT     Mitral Valve Repair   COLONOSCOPY WITH PROPOFOL N/A 06/07/2019   Procedure: COLONOSCOPY WITH PROPOFOL;   Surgeon: TVirgel Manifold MD;  Location: [facility] ENDOSCOPY;  Service: Endoscopy;  Laterality: N/A;   COLONOSCOPY WITH PROPOFOL N/A 02/07/2020   Procedure: COLONOSCOPY WITH PROPOFOL;  Surgeon: TVirgel Manifold MD;  Location: [facility] ENDOSCOPY;  Service: Endoscopy;  Laterality: N/A;   COLONOSCOPY WITH PROPOFOL N/A 12/11/2020   Procedure: COLONOSCOPY WITH PROPOFOL;  Surgeon: TVirgel Manifold MD;  Location: [facility] ENDOSCOPY;  Service: Endoscopy;  Laterality: N/A;   CORONARY ANGIOPLASTY     CORONARY ARTERY BYPASS GRAFT     x 3   01/2018   ESOPHAGOGASTRODUODENOSCOPY N/A 02/07/2020   Procedure: ESOPHAGOGASTRODUODENOSCOPY (EGD);  Surgeon: TVirgel Manifold MD;  [facility]  Service: Endoscopy;  Laterality: N/A;   LEFT HEART CATH AND CORONARY ANGIOGRAPHY N/A 01/24/2018   Procedure: LEFT HEART CATH AND CORONARY ANGIOGRAPHY;  Surgeon: AWellington Hampshire MD;  [place];  Service: Cardiovascular;  Laterality: N/A;   SVT ABLATION N/A 09/12/2021   Procedure: SVT ABLATION;  Surgeon: LVickie Epley MD;  [facility] [place] [place];  Service: Cardiovascular;  Laterality: N/A;   TONSILLECTOMY      Family History  Problem Relation Age of Onset   Cancer Mother        died @ 748  CAD Father        First MI @ 89 S/p heart transplant. Died in mid-50's of cancer.   Cancer Father  Other Brother        alive and well    Social History   Socioeconomic History   Marital status: Married    Spouse name: Not on file   Number of children: Not on file   Years of education: Not on file   Highest education level: Not on file  Occupational History   Not on file  Tobacco Use   Smoking status: Never   Smokeless tobacco: Never  Vaping Use   Vaping Use: Never used  Substance and Sexual Activity   Alcohol use: Not Currently    Comment: rare beer   Drug use: No   Sexual activity: Not on file  Other Topics Concern   Not on file  Social History Narrative   Lives  locally with wife.  Works in [employer].  Does not routinely exercise.   Social Determinants of Health   Financial Resource Strain: Not on file  Food Insecurity: Not on file  Transportation Needs: Not on file  Physical Activity: Not on file  Stress: Not on file  Social Connections: Not on file  Intimate Partner Violence: Not on file     Current Outpatient Medications:    aspirin 81 MG EC tablet, Take 81 mg by mouth daily. , Disp: , Rfl:    b complex vitamins tablet, Take 1 tablet by mouth daily. , Disp: , Rfl:    clotrimazole-betamethasone (LOTRISONE) cream, Apply 1 application. topically 2 (two) times daily., Disp: 30 g, Rfl: 0   Dulaglutide 1.5 MG/0.5ML SOPN, Inject 1.5 mg into the skin every Saturday., Disp: , Rfl:    empagliflozin (JARDIANCE) 25 MG TABS tablet, Take 25 mg by mouth daily., Disp: , Rfl:    gabapentin (NEURONTIN) 100 MG capsule, Take 1 capsule (100 mg total) by mouth 2 (two) times daily., Disp: 60 capsule, Rfl: 6   insulin degludec (TRESIBA) 200 UNIT/ML FlexTouch Pen, 32 Units at bedtime., Disp: , Rfl:    losartan (COZAAR) 25 MG tablet, Take 1 tablet (25 mg total) by mouth daily., Disp: 90 tablet, Rfl: 3   metFORMIN (GLUCOPHAGE) 500 MG tablet, Take 2 tablets (1,000 mg total) by mouth 2 (two) times daily., Disp: 360 tablet, Rfl: 2   metolazone (ZAROXOLYN) 2.5 MG tablet, Take 1 tablet (2.5 mg) by mouth once a week as needed for weight gain greater than 3 lbs, Disp: , Rfl:    metoprolol succinate (TOPROL-XL) 50 MG 24 hr tablet, Take 1 tablet (50 mg total) by mouth daily. Take with or immediately following a meal., Disp: 90 tablet, Rfl: 3   Omega-3 Fatty Acids (FISH OIL) 875 MG CAPS, Take 875 mg by mouth 2 (two) times daily., Disp: , Rfl:    Pancrelipase, Lip-Prot-Amyl, (ZENPEP) 40000-126000 units CPEP, Take 80,000 Units by mouth 3 (three) times daily with meals AND 40,000 Units with snacks., Disp: 600 capsule, Rfl: 1   pramipexole (MIRAPEX) 1.5 MG tablet, Take 1 tablet  (1.5 mg total) by mouth 2 (two) times daily., Disp: 180 tablet, Rfl: 3   rosuvastatin (CRESTOR) 20 MG tablet, Take 1 tablet (20 mg total) by mouth daily., Disp: 90 tablet, Rfl: 0   spironolactone (ALDACTONE) 25 MG tablet, Take 1 tablet (25 mg total) by mouth daily., Disp: 90 tablet, Rfl: 1   torsemide (DEMADEX) 20 MG tablet, Take 2 tablets (40 mg total) by mouth daily. 40 mg daily, Disp: 180 tablet, Rfl: 3   amiodarone (PACERONE) 200 MG tablet, Take 2 tablets (400 mg total) by mouth 2 (  two) times daily for 5 days, THEN 2 tablets (400 mg total) daily for 5 days, THEN 1 tablet (200 mg total) daily., Disp: 110 tablet, Rfl: 0   Allergies  Allergen Reactions   Entresto [Sacubitril-Valsartan] Other (See Comments)    SVT   Lipitor [Atorvastatin] Palpitations    SVT   Pregabalin Palpitations    SVT    ROS Review of Systems  Constitutional:  Positive for fatigue.  HENT:  Positive for hearing loss.   Eyes:  Positive for visual disturbance.  Respiratory:  Positive for shortness of breath.   Cardiovascular:  Positive for chest pain and leg swelling.  Gastrointestinal:  Positive for abdominal distention and diarrhea.       Incontinence     Objective:    Physical Exam Constitutional:      Appearance: He is obese. He is not ill-appearing or toxic-appearing.  HENT:     Nose: Nose normal.     Mouth/Throat:     Mouth: Mucous membranes are moist.  Cardiovascular:     Rate and Rhythm: Normal rate.  Pulmonary:     Breath sounds: Normal breath sounds. No rhonchi.  Abdominal:     General: There is distension.  Musculoskeletal:        General: Swelling present. Normal range of motion.     Comments: Lt toe  is amputated/ h/o seppsis  Neurological:     Mental Status: He is alert and oriented to person, place, and time.     Sensory: Sensory deficit present.     Motor: Weakness present.  Psychiatric:        Mood and Affect: Mood normal.        Thought Content: Thought content normal.         Judgment: Judgment normal.    BP 140/77   Pulse 65   Ht '5\' 8"'  (1.727 m)   Wt 206 lb 8 oz (93.7 kg)   BMI 31.40 kg/m  Wt Readings from Last 3 Encounters:  01/05/22 206 lb 8 oz (93.7 kg)  12/26/21 206 lb 9.6 oz (93.7 kg)  12/22/21 216 lb 14.9 oz (98.4 kg)     Health Maintenance Due  Topic Date Due   COVID-19 Vaccine (1) Never done   Hepatitis C Screening  Never done   Zoster Vaccines- Shingrix (1 of 2) Never done   OPHTHALMOLOGY EXAM  10/01/2021   COLONOSCOPY (Pts 45-25yr Insurance coverage will need to be confirmed)  12/11/2021    There are no preventive care reminders to display for this patient.  Lab Results  Component Value Date   TSH 2.780 07/11/2021   Lab Results  Component Value Date   WBC 19.6 (H) 12/20/2021   HGB 10.4 (L) 12/20/2021   HCT 31.7 (L) 12/20/2021   MCV 89.8 12/20/2021   PLT 139 (L) 12/20/2021   Lab Results  Component Value Date   NA 136 12/20/2021   K 4.4 12/20/2021   CO2 25 12/20/2021   GLUCOSE 142 (H) 12/20/2021   BUN 42 (H) 12/20/2021   CREATININE 1.80 (H) 12/20/2021   BILITOT 0.8 12/19/2021   ALKPHOS 48 12/19/2021   AST 22 12/19/2021   ALT 22 12/19/2021   PROT 7.7 12/19/2021   ALBUMIN 4.0 12/19/2021   CALCIUM 8.2 (L) 12/20/2021   ANIONGAP 9 12/20/2021   EGFR 29 (L) 08/26/2021   Lab Results  Component Value Date   CHOL 122 09/30/2020   Lab Results  Component Value Date   HDL 35 (L)  09/30/2020   Lab Results  Component Value Date   LDLCALC 67 09/30/2020   Lab Results  Component Value Date   TRIG 121 09/30/2020   Lab Results  Component Value Date   CHOLHDL 3.5 09/30/2020   Lab Results  Component Value Date   HGBA1C 7.4 (H) 08/28/2021      Assessment & Plan:   Problem List Items Addressed This Visit       Cardiovascular and Mediastinum   Hypertension    Blood pressure is 144 / 77, this is a mildly elevated blood pressure at rest       Chronic combined systolic (congestive) and diastolic (congestive) heart  failure (HCC)    Chest is clear heart is regular       PAD (peripheral artery disease) ([place])    Patient has amputation of the right foot dose in the past.  Recently he has been admitted in the hospital with amputation of the left foot toes, he was also septic during the hospital admission.  Please see his old records         Endocrine   Diabetes mellitus type 2, uncomplicated ([place]) - Primary    Blood pressure today is 135.  Patient is on insulin therapy       Relevant Orders   POCT glucose (manual entry) (Completed)     Other   Restless leg syndrome    Patient has trouble driving the car because of nervousness restless leg syndrome and autonomic neuropathy       Morbid obesity ([place])    Patient is moderately obese I told him to lose weight.      Disability evaluation Patient was seen in the office today for disability evaluation.  He has uncontrolled diabetes peripheral neuropathy, peripheral vascular disease, he cannot tolerate cold temperature, he failed 6-minute walk test. Patient complains of chest pain on exertion, he becomes incontinent with the stools because of the autonomic dysfunction of the bowel, he has claudication when walking has a history of coronary bypass surgery and mitral valve repair.  He is moderately obese.  I do not feel that he can be gainfully employed for any occupation please see my chart  and Additional notes, please also see the hospital notes  No orders of the defined types were placed in this encounter.   Follow-up: No follow-ups on file.    [name], MD

## 2022-01-05 NOTE — Assessment & Plan Note (Signed)
Patient has amputation of the right foot dose in the past.  Recently he has been admitted in the hospital with amputation of the left foot toes, he was also septic during the hospital admission.  Please see his old records

## 2022-01-08 ENCOUNTER — Ambulatory Visit: Payer: 59 | Admitting: Gastroenterology

## 2022-01-12 ENCOUNTER — Other Ambulatory Visit: Payer: Self-pay

## 2022-01-13 ENCOUNTER — Encounter: Payer: Self-pay | Admitting: Pulmonary Disease

## 2022-01-13 ENCOUNTER — Ambulatory Visit (INDEPENDENT_AMBULATORY_CARE_PROVIDER_SITE_OTHER): Payer: 59 | Admitting: Pulmonary Disease

## 2022-01-13 VITALS — BP 120/70 | HR 72 | Temp 97.6°F | Ht 68.0 in | Wt 206.0 lb

## 2022-01-13 DIAGNOSIS — G4719 Other hypersomnia: Secondary | ICD-10-CM | POA: Diagnosis not present

## 2022-01-13 DIAGNOSIS — I471 Supraventricular tachycardia: Secondary | ICD-10-CM

## 2022-01-13 DIAGNOSIS — R0602 Shortness of breath: Secondary | ICD-10-CM | POA: Diagnosis not present

## 2022-01-13 DIAGNOSIS — I5042 Chronic combined systolic (congestive) and diastolic (congestive) heart failure: Secondary | ICD-10-CM

## 2022-01-13 NOTE — Progress Notes (Signed)
Subjective:    Patient ID: Ricardo Brown, male    DOB: [birth year], 61 y.o.   MRN: [id number] Patient Care Team: Corky Downs, MD as PCP - General (Internal Medicine) Debbe Odea, MD as PCP - Cardiology (Cardiology) Lanier Prude, MD as PCP - Electrophysiology (Cardiology)  Chief Complaint  Patient presents with   pulmonary consult    SOB with exertion, non prod cough and wheezing    HPI The patient is a 61 year old lifelong never smoker who presents for evaluation of dyspnea on exertion since around May 2020 associated with nonproductive cough and occasional wheezing.  He is kindly referred by Dr. Corky Downs.  The patient noted gradual shortness of breath on exertion that started in around May 2020.  He notes this symptom almost daily.  He notes that resting helps to alleviate the symptoms.  When he does more activity or walks on inclines the symptoms get worse.  He has had issues with congestive heart failure since at least 2019.  He required three-vessel CABG and mitral valve repair in June 2019.  EF has been around 40 to 45%.  He does not recall any history of asthma as a child.  He did work in [employer] and was told that he did not "pass" his breathing test at work on a frequent basis.  He has been on amiodarone for at least 2 years.  He has chronic atrial fibrillation and there was an attempt at performing an ablation in March but it could not be done.  He has issues with diabetes and currently is pretty much sedentary.  He notes low stamina.  Nonrestorative sleep.  He has been told he snores.  Does have daytime somnolence.  He does have frequent nocturnal awakenings to go to the bathroom.  No orthopnea or paroxysmal nocturnal dyspnea.  Chronic lower extremity edema, stable.  No calf tenderness.  No fevers, chills or sweats.  Occasional dry cough as above with no sputum production or hemoptysis.  Most recent echocardiogram performed 17 Dec 2021 shows EF of 45 to  50%, grade II DD, repair to the mitral valve, RV size slightly enlarged, RV systolic function normal.  He has not had pulmonary function testing.  He does not endorse any other symptomatology.   Review of Systems A 10 point review of systems was performed and it is as noted above otherwise negative.  Past Medical History:  Diagnosis Date   Chronic combined systolic (congestive) and diastolic (congestive) heart failure (HCC)    a. 01/2018 EF 35-40%; b. 10/2020 Echo: EF 40-45%, glob HK. RVSP 42.36mmHg. Mildly dil LA. Mild MS/AoV sclerosis.   CKD (chronic kidney disease), stage Brown (HCC)    Coronary artery disease    a. 01/2018 late presenting inferior MI; b. 01/2018 Cath: Severe multivessel dzs-->CABG x 3 (LIMA->LAD, VG->OM2, VG->RPDA @ [facility]).   Diabetes mellitus without complication (HCC)    a. Dx ~ 2000.   Heart palpitations    a. Pt reports prior nl echo's and stress tests. Last stress test w/in past 2 yrs - PCP.   High cholesterol    Hypertension    Mitral regurgitation    a. 01/2018 s/p MV repair @ time of CABG.   Myocardial infarction [facility])    Postoperative atrial fibrillation    a. 01/2018 @ time of CABG.   PSVT (paroxysmal supraventricular tachycardia) (HCC)    a. Very symptomatic with multiple ED evaluations.  Has been on amio.   STEMI (ST elevation  myocardial infarction) (HCC) 01/24/2018   Past Surgical History:  Procedure Laterality Date   AMPUTATION TOE Right 08/29/2021   Procedure: AMPUTATION TOE-2nd toe;  Surgeon: Linus Galas, DPM;  Location: [facility] ORS;  Service: Podiatry;  Laterality: Right;   AMPUTATION TOE Left 12/22/2021   Procedure: AMPUTATION TOE-2nd Toe Partial;  Surgeon: Rosetta Posner, DPM;  Location: [facility] ORS;  Service: Podiatry;  Laterality: Left;   CARDIAC CATHETERIZATION     CARDIAC VALVE REPLACEMENT     Mitral Valve Repair   COLONOSCOPY WITH PROPOFOL N/A 06/07/2019   Procedure: COLONOSCOPY WITH PROPOFOL;  Surgeon: Pasty Spillers, MD;  Location: [facility]  ENDOSCOPY;  Service: Endoscopy;  Laterality: N/A;   COLONOSCOPY WITH PROPOFOL N/A 02/07/2020   Procedure: COLONOSCOPY WITH PROPOFOL;  Surgeon: Pasty Spillers, MD;  Location: [facility] ENDOSCOPY;  Service: Endoscopy;  Laterality: N/A;   COLONOSCOPY WITH PROPOFOL N/A 12/11/2020   Procedure: COLONOSCOPY WITH PROPOFOL;  Surgeon: Pasty Spillers, MD;  Location: [facility] ENDOSCOPY;  Service: Endoscopy;  Laterality: N/A;   CORONARY ANGIOPLASTY     CORONARY ARTERY BYPASS GRAFT     x 3   01/2018   ESOPHAGOGASTRODUODENOSCOPY N/A 02/07/2020   Procedure: ESOPHAGOGASTRODUODENOSCOPY (EGD);  Surgeon: Pasty Spillers, MD;  Location: [facility] ENDOSCOPY;  Service: Endoscopy;  Laterality: N/A;   LEFT HEART CATH AND CORONARY ANGIOGRAPHY N/A 01/24/2018   Procedure: LEFT HEART CATH AND CORONARY ANGIOGRAPHY;  Surgeon: Iran Ouch, MD;  Location: [facility] INVASIVE CV LAB;  Service: Cardiovascular;  Laterality: N/A;   SVT ABLATION N/A 09/12/2021   Procedure: SVT ABLATION;  Surgeon: Lanier Prude, MD;  Location: [facility] INVASIVE CV LAB;  Service: Cardiovascular;  Laterality: N/A;   TONSILLECTOMY     Patient Active Problem List   Diagnosis Date Noted   Sepsis (HCC) 12/20/2021   Severe sepsis with acute organ dysfunction (HCC) 12/19/2021   Morbid obesity (HCC) 12/19/2021   Cellulitis of left lower extremity 12/19/2021   AKI (acute kidney injury) (HCC) 12/19/2021   Stage 3a chronic kidney disease (CKD) (HCC) 12/19/2021   Dermatitis 12/04/2021   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 08/28/2021   Chronic combined systolic (congestive) and diastolic (congestive) heart failure (HCC) 08/28/2021   PAD (peripheral artery disease) (HCC) 08/28/2021   Restless leg syndrome 05/21/2021   History of colonic polyps    Lipoma of colon    Screening for colon cancer    Polyp of colon    Coronary artery disease 05/31/2019   PSVT (paroxysmal supraventricular tachycardia) (HCC) 03/22/2018   Diabetes mellitus type 2, uncomplicated (HCC)  03/07/2018   HLD (hyperlipidemia) 03/07/2018   Hypertension 03/07/2018   Right ventricular dysfunction 02/02/2018   Postoperative atrial fibrillation (HCC) 02/01/2018   S/P CABG x 3 01/28/2018   Family History  Problem Relation Age of Onset   Cancer Mother        died @ 20   CAD Father        First MI @ 16. S/p heart transplant. Died in mid-50's of cancer.   Cancer Father    Other Brother        alive and well   Social History   Tobacco Use   Smoking status: Never   Smokeless tobacco: Never  Substance Use Topics   Alcohol use: Not Currently    Comment: rare beer   Allergies  Allergen Reactions   Entresto [Sacubitril-Valsartan] Other (See Comments)    SVT   Lipitor [Atorvastatin] Palpitations    SVT   Pregabalin Palpitations  SVT   Current Meds  Medication Sig   aspirin 81 MG EC tablet Take 81 mg by mouth daily.    b complex vitamins tablet Take 1 tablet by mouth daily.    clotrimazole-betamethasone (LOTRISONE) cream Apply 1 application. topically 2 (two) times daily.   Dulaglutide 1.5 MG/0.5ML SOPN Inject 1.5 mg into the skin every Saturday.   empagliflozin (JARDIANCE) 25 MG TABS tablet Take 25 mg by mouth daily.   gabapentin (NEURONTIN) 100 MG capsule Take 1 capsule (100 mg total) by mouth 2 (two) times daily.   insulin degludec (TRESIBA) 200 UNIT/ML FlexTouch Pen 32 Units at bedtime.   losartan (COZAAR) 25 MG tablet Take 1 tablet (25 mg total) by mouth daily.   metFORMIN (GLUCOPHAGE) 500 MG tablet Take 2 tablets (1,000 mg total) by mouth 2 (two) times daily.   metolazone (ZAROXOLYN) 2.5 MG tablet Take 1 tablet (2.5 mg) by mouth once a week as needed for weight gain greater than 3 lbs   metoprolol succinate (TOPROL-XL) 50 MG 24 hr tablet Take 1 tablet (50 mg total) by mouth daily. Take with or immediately following a meal.   Omega-3 Fatty Acids (FISH OIL) 875 MG CAPS Take 875 mg by mouth 2 (two) times daily.   Pancrelipase, Lip-Prot-Amyl, (ZENPEP) 40000-126000  units CPEP Take 80,000 Units by mouth 3 (three) times daily with meals AND 40,000 Units with snacks.   pramipexole (MIRAPEX) 1.5 MG tablet Take 1 tablet (1.5 mg total) by mouth 2 (two) times daily.   rosuvastatin (CRESTOR) 20 MG tablet Take 1 tablet (20 mg total) by mouth daily.   spironolactone (ALDACTONE) 25 MG tablet Take 1 tablet (25 mg total) by mouth daily.   torsemide (DEMADEX) 20 MG tablet Take 2 tablets (40 mg total) by mouth daily. 40 mg daily    There is no immunization history on file for this patient.     Objective:   Physical Exam BP 120/70 (BP Location: Left Arm, Cuff Size: Normal)   Pulse 72   Temp 97.6 F (36.4 C) (Temporal)   Ht 5\' 8"  (1.727 m)   Wt 206 lb (93.4 kg)   SpO2 100%   BMI 31.32 kg/m  GENERAL: Overweight gentleman, no acute distress, fully ambulatory, no conversational dyspnea. HEAD: Normocephalic, atraumatic.  EYES: Pupils equal, round, reactive to light.  No scleral icterus.  MOUTH: Wears dentures uppers and lowers, oral mucosa moist.  Mallampati Brown. NECK: Supple. No thyromegaly. Trachea midline. No JVD.  No adenopathy. PULMONARY: Good air entry bilaterally.  No adventitious sounds. CARDIOVASCULAR: S1 and S2. Regular rate and rhythm.  No rubs, murmurs or gallops heard. ABDOMEN: Protuberant, otherwise benign. MUSCULOSKELETAL: No joint deformity, no clubbing, 1+ edema LEs.  NEUROLOGIC: No overt focal deficit, no gait disturbance, speech is fluent. SKIN: Intact,warm,dry. PSYCH: Mood and behavior normal     01/13/2022   10:00 AM  Results of the Epworth flowsheet  Sitting and reading 3  Watching TV 3  Sitting, inactive in a public place (e.g. a theatre or a meeting) 3  As a passenger in a car for an hour without a break 2  Lying down to rest in the afternoon when circumstances permit 1  Sitting and talking to someone 2  Sitting quietly after a lunch without alcohol 3  In a car, while stopped for a few minutes in traffic 2  Total score 19        Assessment & Plan:     ICD-10-CM   1. SOB (shortness of  breath)  R06.02 Pulmonary Function Test [facility] Only   Suspect multifactorial: Cardiomyopathy/A-fib/deconditioning Cannot exclude potential asthma PFTs    2. Excessive daytime sleepiness  G47.19 Home sleep test   High risk for sleep apnea Home sleep study Epworth 19    3. Chronic combined systolic (congestive) and diastolic (congestive) heart failure (HCC)  I50.42    This issue adds complexity to his management Continue follow-up with cardiology    4. PSVT (paroxysmal supraventricular tachycardia) (HCC)  I47.1    On amiodarone     Orders Placed This Encounter  Procedures   Pulmonary Function Test [facility] Only    Next available.    Standing Status:   Future    Standing Expiration Date:   01/14/2023    Order Specific Question:   Full PFT: includes the following: basic spirometry, spirometry pre & post bronchodilator, diffusion capacity (DLCO), lung volumes    Answer:   Full PFT   Home sleep test    Standing Status:   Future    Standing Expiration Date:   01/14/2023    Order Specific Question:   Where should this test be performed:    Answer:   [facility] - Pulmonary   Suspect that the patient's dyspnea is multifactorial.  PFTs will help elucidate this issue.  The patient also has high risk for obstructive sleep apnea will proceed with home sleep study.  Will see the patient in follow-up in 4 to 6 weeks time he is to contact us prior to that time should any new difficulties arise.  Gailen Shelter, MD Advanced Bronchoscopy PCCM [facility]    *This note was dictated using voice recognition software/Dragon.  Despite best efforts to proofread, errors can occur which can change the meaning. Any transcriptional errors that result from this process are unintentional and may not be fully corrected at the time of dictation.

## 2022-01-13 NOTE — Patient Instructions (Signed)
We are going to check breathing tests and a home sleep test.  We will see you in follow-up in 4 to 6 weeks time with either me or the nurse practitioner at that time to go over these tests with you.

## 2022-01-19 ENCOUNTER — Ambulatory Visit: Payer: Self-pay | Admitting: Gastroenterology

## 2022-01-20 ENCOUNTER — Telehealth: Payer: Self-pay | Admitting: Gastroenterology

## 2022-01-20 NOTE — Telephone Encounter (Signed)
Patient left a voicemail to reschedule his appt he missed yesterday (6 mnth fu). Patient had previously canceled his original 6 mnth fu appt. What day is good to reschedule him for if need be?

## 2022-01-21 NOTE — Telephone Encounter (Signed)
Called patient back to let him know that Dr. Vicente Males would be able to see him on 02/10/2022 at 2 PM. Patient agreed.

## 2022-01-29 ENCOUNTER — Ambulatory Visit: Payer: 59 | Attending: Pulmonary Disease

## 2022-01-29 DIAGNOSIS — R0602 Shortness of breath: Secondary | ICD-10-CM | POA: Insufficient documentation

## 2022-01-29 LAB — PULMONARY FUNCTION TEST ARMC ONLY
DL/VA % pred: 90 %
DL/VA: 3.87 ml/min/mmHg/L
DLCO unc % pred: 74 %
DLCO unc: 19.44 ml/min/mmHg
FEF 25-75 Post: 4.57 L/sec
FEF 25-75 Pre: 4.58 L/sec
FEF2575-%Change-Post: 0 %
FEF2575-%Pred-Post: 163 %
FEF2575-%Pred-Pre: 164 %
FEV1-%Change-Post: 0 %
FEV1-%Pred-Post: 94 %
FEV1-%Pred-Pre: 93 %
FEV1-Post: 3.16 L
FEV1-Pre: 3.15 L
FEV1FVC-%Change-Post: -3 %
FEV1FVC-%Pred-Pre: 121 %
FEV6-%Change-Post: 4 %
FEV6-%Pred-Post: 84 %
FEV6-%Pred-Pre: 81 %
FEV6-Post: 3.59 L
FEV6-Pre: 3.44 L
FEV6FVC-%Pred-Post: 104 %
FEV6FVC-%Pred-Pre: 104 %
FVC-%Change-Post: 4 %
FVC-%Pred-Post: 80 %
FVC-%Pred-Pre: 77 %
FVC-Post: 3.59 L
FVC-Pre: 3.44 L
Post FEV1/FVC ratio: 88 %
Post FEV6/FVC ratio: 100 %
Pre FEV1/FVC ratio: 92 %
Pre FEV6/FVC Ratio: 100 %
RV % pred: 53 %
RV: 1.15 L
TLC % pred: 73 %
TLC: 4.86 L

## 2022-01-29 MED ORDER — ALBUTEROL SULFATE (2.5 MG/3ML) 0.083% IN NEBU
2.5000 mg | INHALATION_SOLUTION | Freq: Once | RESPIRATORY_TRACT | Status: AC
  Administered 2022-01-29: 2.5 mg via RESPIRATORY_TRACT
  Filled 2022-01-29: qty 3

## 2022-02-09 ENCOUNTER — Encounter: Payer: Self-pay | Admitting: Internal Medicine

## 2022-02-09 ENCOUNTER — Ambulatory Visit (INDEPENDENT_AMBULATORY_CARE_PROVIDER_SITE_OTHER): Payer: 59 | Admitting: Internal Medicine

## 2022-02-09 ENCOUNTER — Ambulatory Visit: Payer: 59

## 2022-02-09 VITALS — BP 128/75 | HR 68 | Ht 68.0 in | Wt 214.0 lb

## 2022-02-09 DIAGNOSIS — E119 Type 2 diabetes mellitus without complications: Secondary | ICD-10-CM

## 2022-02-09 DIAGNOSIS — I5042 Chronic combined systolic (congestive) and diastolic (congestive) heart failure: Secondary | ICD-10-CM | POA: Diagnosis not present

## 2022-02-09 DIAGNOSIS — G4733 Obstructive sleep apnea (adult) (pediatric): Secondary | ICD-10-CM

## 2022-02-09 DIAGNOSIS — I998 Other disorder of circulatory system: Secondary | ICD-10-CM

## 2022-02-09 DIAGNOSIS — G4719 Other hypersomnia: Secondary | ICD-10-CM

## 2022-02-09 DIAGNOSIS — N1832 Chronic kidney disease, stage 3b: Secondary | ICD-10-CM

## 2022-02-09 DIAGNOSIS — I25810 Atherosclerosis of coronary artery bypass graft(s) without angina pectoris: Secondary | ICD-10-CM | POA: Diagnosis not present

## 2022-02-09 DIAGNOSIS — I739 Peripheral vascular disease, unspecified: Secondary | ICD-10-CM

## 2022-02-09 DIAGNOSIS — E1122 Type 2 diabetes mellitus with diabetic chronic kidney disease: Secondary | ICD-10-CM

## 2022-02-09 DIAGNOSIS — G629 Polyneuropathy, unspecified: Secondary | ICD-10-CM

## 2022-02-09 DIAGNOSIS — N1831 Chronic kidney disease, stage 3a: Secondary | ICD-10-CM

## 2022-02-09 DIAGNOSIS — Z794 Long term (current) use of insulin: Secondary | ICD-10-CM | POA: Diagnosis not present

## 2022-02-09 LAB — GLUCOSE, POCT (MANUAL RESULT ENTRY): POC Glucose: 135 mg/dl — AB (ref 70–99)

## 2022-02-09 NOTE — Assessment & Plan Note (Signed)
Patient is being followed by the vascular specialist

## 2022-02-09 NOTE — Progress Notes (Signed)
Established Patient Office Visit  Subjective:  Patient ID: Ricardo Brown, male    DOB: 1961-06-05  Age: 61 y.o. MRN: [id number]  CC:  Chief Complaint  Patient presents with   Diabetes    1 month follow up    Diabetes    Ricardo Brown presents for check only.  Up  Past Medical History:  Diagnosis Date   Chronic combined systolic (congestive) and diastolic (congestive) heart failure ([place])    a. 01/2018 EF 35-40%; b. 10/2020 Echo: EF 40-45%, glob HK. RVSP 42.90mHg. Mildly dil LA. Mild MS/AoV sclerosis.   CKD (chronic kidney disease), stage Brown (HCC)    Coronary artery disease    a. 01/2018 late presenting inferior MI; b. 01/2018 Cath: Severe multivessel dzs-->CABG x 3 (LIMA->LAD, VG->OM2, VG->RPDA @ [facility]).   Diabetes mellitus without complication ([place]    a. Dx ~ 2000.   Heart palpitations    a. Pt reports prior nl echo's and stress tests. Last stress test w/in past 2 yrs - PCP.   High cholesterol    Hypertension    Mitral regurgitation    a. 01/2018 s/p MV repair @ time of CABG.   Myocardial infarction ([facility]    Postoperative atrial fibrillation    a. 01/2018 @ time of CABG.   PSVT (paroxysmal supraventricular tachycardia) ([place]    a. Very symptomatic with multiple ED evaluations.  Has been on amio.   STEMI (ST elevation myocardial infarction) ([place] 01/24/2018    Past Surgical History:  Procedure Laterality Date   AMPUTATION TOE Right 08/29/2021   Procedure: AMPUTATION TOE-2nd toe;  Surgeon: CSharlotte Alamo DPM;  Location: [facility] ORS;  Service: Podiatry;  Laterality: Right;   AMPUTATION TOE Left 12/22/2021   Procedure: AMPUTATION TOE-2nd Toe Partial;  Surgeon: BCaroline More DPM;  Location: [facility] ORS;  Service: Podiatry;  Laterality: Left;   CARDIAC CATHETERIZATION     CARDIAC VALVE REPLACEMENT     Mitral Valve Repair   COLONOSCOPY WITH PROPOFOL N/A 06/07/2019   Procedure: COLONOSCOPY WITH PROPOFOL;  Surgeon: TVirgel Manifold MD;  Location: [facility] ENDOSCOPY;   Service: Endoscopy;  Laterality: N/A;   COLONOSCOPY WITH PROPOFOL N/A 02/07/2020   Procedure: COLONOSCOPY WITH PROPOFOL;  Surgeon: TVirgel Manifold MD;  Location: [facility] ENDOSCOPY;  Service: Endoscopy;  Laterality: N/A;   COLONOSCOPY WITH PROPOFOL N/A 12/11/2020   Procedure: COLONOSCOPY WITH PROPOFOL;  Surgeon: TVirgel Manifold MD;  Location: [facility] ENDOSCOPY;  Service: Endoscopy;  Laterality: N/A;   CORONARY ANGIOPLASTY     CORONARY ARTERY BYPASS GRAFT     x 3   01/2018   ESOPHAGOGASTRODUODENOSCOPY N/A 02/07/2020   Procedure: ESOPHAGOGASTRODUODENOSCOPY (EGD);  Surgeon: TVirgel Manifold MD;  [facility];  Service: Endoscopy;  Laterality: N/A;   LEFT HEART CATH AND CORONARY ANGIOGRAPHY N/A 01/24/2018   Procedure: LEFT HEART CATH AND CORONARY ANGIOGRAPHY;  Surgeon: AWellington Hampshire MD;  [facility];  Service: Cardiovascular;  Laterality: N/A;   SVT ABLATION N/A 09/12/2021   Procedure: SVT ABLATION;  Surgeon: LVickie Epley MD;  [facility];  Service: Cardiovascular;  Laterality: N/A;   TONSILLECTOMY      Family History  Problem Relation Age of Onset   Cancer Mother        died @ 744  CAD Father        First MI @ 89 S/p heart transplant. Died in mid-50's of cancer.   Cancer Father    Other  Brother        alive and well    Social History   Socioeconomic History   Marital status: Married    Spouse name: Not on file   Number of children: Not on file   Years of education: Not on file   Highest education level: Not on file  Occupational History   Not on file  Tobacco Use   Smoking status: Never   Smokeless tobacco: Never  Vaping Use   Vaping Use: Never used  Substance and Sexual Activity   Alcohol use: Not Currently    Comment: rare beer   Drug use: No   Sexual activity: Not on file  Other Topics Concern   Not on file  Social History Narrative   Lives locally with wife.  Works in [employer].  Does not routinely  exercise.   Social Determinants of Health   Financial Resource Strain: Not on file  Food Insecurity: Not on file  Transportation Needs: Not on file  Physical Activity: Not on file  Stress: Not on file  Social Connections: Not on file  Intimate Partner Violence: Not on file     Current Outpatient Medications:    aspirin 81 MG EC tablet, Take 81 mg by mouth daily. , Disp: , Rfl:    b complex vitamins tablet, Take 1 tablet by mouth daily. , Disp: , Rfl:    clotrimazole-betamethasone (LOTRISONE) cream, Apply 1 application. topically 2 (two) times daily., Disp: 30 g, Rfl: 0   Dulaglutide 1.5 MG/0.5ML SOPN, Inject 1.5 mg into the skin every Saturday., Disp: , Rfl:    empagliflozin (JARDIANCE) 25 MG TABS tablet, Take 25 mg by mouth daily., Disp: , Rfl:    gabapentin (NEURONTIN) 100 MG capsule, Take 1 capsule (100 mg total) by mouth 2 (two) times daily., Disp: 60 capsule, Rfl: 6   insulin degludec (TRESIBA) 200 UNIT/ML FlexTouch Pen, 32 Units at bedtime., Disp: , Rfl:    losartan (COZAAR) 25 MG tablet, Take 1 tablet (25 mg total) by mouth daily., Disp: 90 tablet, Rfl: 3   metFORMIN (GLUCOPHAGE) 500 MG tablet, Take 2 tablets (1,000 mg total) by mouth 2 (two) times daily., Disp: 360 tablet, Rfl: 2   metolazone (ZAROXOLYN) 2.5 MG tablet, Take 1 tablet (2.5 mg) by mouth once a week as needed for weight gain greater than 3 lbs, Disp: , Rfl:    metoprolol succinate (TOPROL-XL) 50 MG 24 hr tablet, Take 1 tablet (50 mg total) by mouth daily. Take with or immediately following a meal., Disp: 90 tablet, Rfl: 3   Omega-3 Fatty Acids (FISH OIL) 875 MG CAPS, Take 875 mg by mouth 2 (two) times daily., Disp: , Rfl:    Pancrelipase, Lip-Prot-Amyl, (ZENPEP) 40000-126000 units CPEP, Take 80,000 Units by mouth 3 (three) times daily with meals AND 40,000 Units with snacks., Disp: 600 capsule, Rfl: 1   pramipexole (MIRAPEX) 1.5 MG tablet, Take 1 tablet (1.5 mg total) by mouth 2 (two) times daily., Disp: 180 tablet,  Rfl: 3   rosuvastatin (CRESTOR) 20 MG tablet, Take 1 tablet (20 mg total) by mouth daily., Disp: 90 tablet, Rfl: 0   spironolactone (ALDACTONE) 25 MG tablet, Take 1 tablet (25 mg total) by mouth daily., Disp: 90 tablet, Rfl: 1   torsemide (DEMADEX) 20 MG tablet, Take 2 tablets (40 mg total) by mouth daily. 40 mg daily, Disp: 180 tablet, Rfl: 3   amiodarone (PACERONE) 200 MG tablet, Take 2 tablets (400 mg total) by mouth 2 (two)  times daily for 5 days, THEN 2 tablets (400 mg total) daily for 5 days, THEN 1 tablet (200 mg total) daily., Disp: 110 tablet, Rfl: 0   Allergies  Allergen Reactions   Lipitor [Atorvastatin] Palpitations    SVT   Sacubitril-Valsartan Other (See Comments)    SVT Other reaction(s): Other (see comments) Other reaction(s): Other (See Comments) SVT SVT   Pregabalin Palpitations    SVT Other reaction(s): Other (see comments) SVT SVT    ROS Review of Systems  Constitutional: Negative.   HENT: Negative.    Eyes: Negative.   Respiratory: Negative.    Cardiovascular: Negative.   Gastrointestinal: Negative.   Endocrine: Negative.   Genitourinary: Negative.   Musculoskeletal: Negative.   Skin: Negative.   Allergic/Immunologic: Negative.   Neurological: Negative.   Hematological: Negative.   Psychiatric/Behavioral: Negative.    All other systems reviewed and are negative.     Objective:    Physical Exam Vitals reviewed.  Constitutional:      Appearance: Normal appearance.  HENT:     Mouth/Throat:     Mouth: Mucous membranes are moist.  Eyes:     Pupils: Pupils are equal, round, and reactive to light.  Neck:     Vascular: No carotid bruit.  Cardiovascular:     Rate and Rhythm: Normal rate and regular rhythm.     Pulses: Normal pulses.     Heart sounds: Normal heart sounds.  Pulmonary:     Effort: Pulmonary effort is normal.     Breath sounds: Normal breath sounds.  Abdominal:     General: Bowel sounds are normal.     Palpations: Abdomen is  soft. There is no hepatomegaly, splenomegaly or mass.     Tenderness: There is no abdominal tenderness.     Hernia: No hernia is present.  Musculoskeletal:     Cervical back: Neck supple.     Right lower leg: No edema.     Left lower leg: No edema.  Skin:    Findings: No rash.  Neurological:     Mental Status: He is alert and oriented to person, place, and time.     Motor: No weakness.  Psychiatric:        Mood and Affect: Mood normal.        Behavior: Behavior normal.     BP 128/75   Pulse 68   Ht _0  (1.727 m)   Wt 214 lb (97.1 kg)   BMI 32.54 kg/m  Wt Readings from Last 3 Encounters:  02/09/22 214 lb (97.1 kg)  01/13/22 206 lb (93.4 kg)  01/05/22 206 lb 8 oz (93.7 kg)     Health Maintenance Due  Topic Date Due   COVID-19 Vaccine (1) Never done   Hepatitis C Screening  Never done   Zoster Vaccines- Shingrix (1 of 2) Never done   OPHTHALMOLOGY EXAM  10/01/2021   COLONOSCOPY (Pts 45-55yr Insurance coverage will need to be confirmed)  12/11/2021   FOOT EXAM  01/30/2022    There are no preventive care reminders to display for this patient.  Lab Results  Component Value Date   TSH 2.780 07/11/2021   Lab Results  Component Value Date   WBC 19.6 (H) 12/20/2021   HGB 10.4 (L) 12/20/2021   HCT 31.7 (L) 12/20/2021   MCV 89.8 12/20/2021   PLT 139 (L) 12/20/2021   Lab Results  Component Value Date   NA 136 12/20/2021   K 4.4 12/20/2021   CO2 25  12/20/2021   GLUCOSE 142 (H) 12/20/2021   BUN 42 (H) 12/20/2021   CREATININE 1.80 (H) 12/20/2021   BILITOT 0.8 12/19/2021   ALKPHOS 48 12/19/2021   AST 22 12/19/2021   ALT 22 12/19/2021   PROT 7.7 12/19/2021   ALBUMIN 4.0 12/19/2021   CALCIUM 8.2 (L) 12/20/2021   ANIONGAP 9 12/20/2021   EGFR 29 (L) 08/26/2021   Lab Results  Component Value Date   CHOL 122 09/30/2020   Lab Results  Component Value Date   HDL 35 (L) 09/30/2020   Lab Results  Component Value Date   LDLCALC 67 09/30/2020   Lab Results   Component Value Date   TRIG 121 09/30/2020   Lab Results  Component Value Date   CHOLHDL 3.5 09/30/2020   Lab Results  Component Value Date   HGBA1C 7.4 (H) 08/28/2021      Assessment & Plan:   Problem List Items Addressed This Visit       Cardiovascular and Mediastinum   Coronary artery disease    Patient denies any history of anginal      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    Patient is being followed by the vascular specialist      Chronic combined systolic (congestive) and diastolic (congestive) heart failure (HCC)       Stable at the present time      PAD (peripheral artery disease) ([place])    PAD need to be followed up with vascular        Endocrine   Type 2 diabetes mellitus with diabetic chronic kidney disease ([place])    Blood sugar under control today        Nervous and Auditory   Neuropathy    Patient has diabetic neuropathy below both knees        Genitourinary   Stage 3a chronic kidney disease (CKD) ([place])    Refer to nephrologist      Other Visit Diagnoses     Type 2 diabetes mellitus without complication, with long-term current use of insulin ([place])    -  Primary   Relevant Orders   POCT glucose (manual entry) (Completed)       No orders of the defined types were placed in this encounter.   Follow-up: No follow-ups on file.    [name], MD

## 2022-02-09 NOTE — Assessment & Plan Note (Signed)
Refer to nephrologist 

## 2022-02-09 NOTE — Assessment & Plan Note (Signed)
PAD need to be followed up with vascular

## 2022-02-09 NOTE — Assessment & Plan Note (Signed)
Patient has diabetic neuropathy below both knees

## 2022-02-09 NOTE — Assessment & Plan Note (Signed)
Blood sugar under control today

## 2022-02-09 NOTE — Assessment & Plan Note (Signed)
Patient denies any history of anginal

## 2022-02-09 NOTE — Assessment & Plan Note (Signed)
Stable at the present time. 

## 2022-02-10 ENCOUNTER — Ambulatory Visit (INDEPENDENT_AMBULATORY_CARE_PROVIDER_SITE_OTHER): Payer: 59 | Admitting: Gastroenterology

## 2022-02-10 ENCOUNTER — Other Ambulatory Visit: Payer: Self-pay

## 2022-02-10 ENCOUNTER — Encounter: Payer: Self-pay | Admitting: Gastroenterology

## 2022-02-10 VITALS — BP 115/70 | HR 86 | Temp 98.3°F | Wt 215.8 lb

## 2022-02-10 DIAGNOSIS — K8689 Other specified diseases of pancreas: Secondary | ICD-10-CM | POA: Diagnosis not present

## 2022-02-10 DIAGNOSIS — G4733 Obstructive sleep apnea (adult) (pediatric): Secondary | ICD-10-CM

## 2022-02-10 NOTE — Progress Notes (Signed)
Jonathon Bellows MD, MRCP(U.K) [address]  [place]  [place], [place] [address]  Main: [phone]  Fax: [phone]   Primary Care Physician: [name], MD  Primary Gastroenterologist:  Dr. Jonathon Bellows   Chief Complaint  Patient presents with   Pancreatic insuffiency    HPI: Ricardo Brown is a 61 y.o. male   Summary of history :  He has been a patient of Dr Bonna Gains in the past and was being followed for pancreatic insufficiency last seen in 07/2021. At that time was on Zenpep  In 2020 seen for abdominal bloating and had reported alternating constipation and diarrhea at that time.  Random colon Biopsies in 2020 were negative for microscopic colitis. Colonoscopy in May 2022 with 5 subcentimeter polyps removed.  Tattoo noted in the hepatic flexure site did not show any residual polyp tissue.  Small lipoma seen as well as hepatic flexure.  Pathology showed tubular adenoma polyps and lipoma biopsies were consistent with lipoma. Upper endoscopy in July 2021 -short segment salmon-colored mucosa seen in the distal esophagus with biopsies showing reflux gastroesophagitis.  Mucosal changes in the duodenum biopsied.  This showed gastric heterotopia.   Interval history   07/10/2021-02/10/2022  Patient was taking Zenpep but developed constipation and stopped it.  Since then he has had no diarrhea.  Rather suffers from hard stools.  No other GI complaints.  He came in today because he was told to come in in 6 months.  No other GI complaints.  Previously Dr. Bonna Gains had ordered a CT scan of his pancreas insurance denied the test.    Current Outpatient Medications  Medication Sig Dispense Refill   aspirin 81 MG EC tablet Take 81 mg by mouth daily.      b complex vitamins tablet Take 1 tablet by mouth daily.      clotrimazole-betamethasone (LOTRISONE) cream Apply 1 application. topically 2 (two) times daily. 30 g 0   Dulaglutide 1.5 MG/0.5ML SOPN Inject 1.5 mg into the  skin every Saturday.     empagliflozin (JARDIANCE) 25 MG TABS tablet Take 25 mg by mouth daily.     gabapentin (NEURONTIN) 100 MG capsule Take 1 capsule (100 mg total) by mouth 2 (two) times daily. 60 capsule 6   insulin degludec (TRESIBA) 200 UNIT/ML FlexTouch Pen 32 Units at bedtime.     losartan (COZAAR) 50 MG tablet Take 1 tablet by mouth daily.     metFORMIN (GLUCOPHAGE) 500 MG tablet Take 2 tablets (1,000 mg total) by mouth 2 (two) times daily. 360 tablet 2   metolazone (ZAROXOLYN) 2.5 MG tablet Take 1 tablet (2.5 mg) by mouth once a week as needed for weight gain greater than 3 lbs     metoprolol succinate (TOPROL-XL) 50 MG 24 hr tablet Take 1 tablet (50 mg total) by mouth daily. Take with or immediately following a meal. 90 tablet 3   Omega-3 Fatty Acids (FISH OIL) 875 MG CAPS Take 875 mg by mouth 2 (two) times daily.     Pancrelipase, Lip-Prot-Amyl, (ZENPEP) 40000-126000 units CPEP Take 80,000 Units by mouth 3 (three) times daily with meals AND 40,000 Units with snacks. 600 capsule 1   pramipexole (MIRAPEX) 1.5 MG tablet Take 1 tablet (1.5 mg total) by mouth 2 (two) times daily. 180 tablet 3   rosuvastatin (CRESTOR) 20 MG tablet Take 1 tablet (20 mg total) by mouth daily. 90 tablet 0   spironolactone (ALDACTONE) 25 MG tablet Take 1 tablet (25 mg total)  by mouth daily. 90 tablet 1   torsemide (DEMADEX) 20 MG tablet Take 2 tablets (40 mg total) by mouth daily. 40 mg daily 180 tablet 3   amiodarone (PACERONE) 200 MG tablet Take 2 tablets (400 mg total) by mouth 2 (two) times daily for 5 days, THEN 2 tablets (400 mg total) daily for 5 days, THEN 1 tablet (200 mg total) daily. (Patient not taking: Reported on 02/10/2022) 110 tablet 0   No current facility-administered medications for this visit.    Allergies as of 02/10/2022 - Review Complete 02/10/2022  Allergen Reaction Noted   Lipitor [atorvastatin] Palpitations 04/23/2015   Sacubitril-valsartan Other (See Comments) 06/07/2019    Pregabalin Palpitations 10/02/2019    ROS:  General: Negative for anorexia, weight loss, fever, chills, fatigue, weakness. ENT: Negative for hoarseness, difficulty swallowing , nasal congestion. CV: Negative for chest pain, angina, palpitations, dyspnea on exertion, peripheral edema.  Respiratory: Negative for dyspnea at rest, dyspnea on exertion, cough, sputum, wheezing.  GI: See history of present illness. GU:  Negative for dysuria, hematuria, urinary incontinence, urinary frequency, nocturnal urination.  Endo: Negative for unusual weight change.    Physical Examination:   BP 115/70   Pulse 86   Temp 98.3 F (36.8 C) (Oral)   Wt 215 lb 12.8 oz (97.9 kg)   BMI 32.81 kg/m   General: Well-nourished, well-developed in no acute distress.  Eyes: No icterus. Conjunctivae pink. Mouth: Oropharyngeal mucosa moist and pink , no lesions erythema or exudate. Lungs: Clear to auscultation bilaterally. Non-labored. Heart: Regular rate and rhythm, no murmurs rubs or gallops.  Abdomen: Bowel sounds are normal, nontender, nondistended, no hepatosplenomegaly or masses, no abdominal bruits or hernia , no rebound or guarding.   Extremities: No lower extremity edema. No clubbing or deformities. Neuro: Alert and oriented x 3.  Grossly intact. Skin: Warm and dry, no jaundice.   Psych: Alert and cooperative, normal mood and affect.   Imaging Studies: Pulmonary Function Test [facility] Only  Result Date: 02/02/2022 Spirometry Data Is Acceptable and Reproducible Moderate Restrictive Lung disease Consider outpatient Pulmonary Consultation if needed Clinical Correlation Advised    Assessment and Plan:   Ricardo Brown is a 61 y.o. y/o male here for follow-up for a past history of pancreatic insufficiency on treatment with Zenpep, since his last visit he stopped Ozempic because he developed constipation.  We talked about a high-fiber diet I have given him patient information on a high-fiber diet,  advised 25 g of fiber per day.  Advised him to stop the Zenpep if his symptoms of diarrhea return to come back and see me.      Dr Jonathon Bellows  MD,MRCP [facility]) Follow up in as needed

## 2022-02-10 NOTE — Patient Instructions (Signed)

## 2022-02-11 ENCOUNTER — Encounter: Payer: Self-pay | Admitting: Adult Health

## 2022-02-11 ENCOUNTER — Telehealth (INDEPENDENT_AMBULATORY_CARE_PROVIDER_SITE_OTHER): Payer: 59 | Admitting: Adult Health

## 2022-02-11 ENCOUNTER — Telehealth: Payer: Self-pay | Admitting: Pulmonary Disease

## 2022-02-11 DIAGNOSIS — R0602 Shortness of breath: Secondary | ICD-10-CM

## 2022-02-11 DIAGNOSIS — R942 Abnormal results of pulmonary function studies: Secondary | ICD-10-CM

## 2022-02-11 DIAGNOSIS — G4719 Other hypersomnia: Secondary | ICD-10-CM

## 2022-02-11 DIAGNOSIS — Z79899 Other long term (current) drug therapy: Secondary | ICD-10-CM | POA: Diagnosis not present

## 2022-02-11 NOTE — Progress Notes (Signed)
Virtual Visit via Video Note  I connected with Ricardo Brown on 02/11/22 at 11:30 AM EDT by a video enabled telemedicine application and verified that I am speaking with the correct person using two identifiers.  Location: Patient: Home  Provider: Office    I discussed the limitations of evaluation and management by telemedicine and the availability of in person appointments. The patient expressed understanding and agreed to proceed.  History of Present Illness: 61 year old male seen for pulmonary and sleep consult January 13, 2022.  Patient had symptoms suspicious for sleep apnea with daytime sleepiness and snoring.  Patient was set up for a home sleep study on February 09, 2022.  This showed moderate sleep apnea with AHI of 15.9/hour and SPO2 low at 73%.  We discussed his sleep study results.  Went over treatment options including weight loss, oral appliance and CPAP.  Patient would like to proceed with CPAP . Patient education given.  Has restless leg syndrome on Mirapex and Neurontin. Has some benefit.   Patient was having some shortness of breath.  Has been getting winded with activities for last 2 years. Has decreased activity tolerance and stamina. Has occasional dry cough and wheezing . He was set up for pulmonary function testing.  He is a never smoker.  PFTs done on January 29, 2022 showed normal lung function with no airflow obstruction or restriction.  FEV1 was 94%, ratio 88, FVC 80%, no significant bronchodilator response, DLCO was at slightly decreased at 74%. No history of asthma.  Patient has a history of A-fib and congestive heart failure.  He is on amiodarone started about 2-3 years ago. Has IDDM.  Echo 12/17/21 showed EF at 45-50% , Gr II DD, MV repaired, RVSF normal , RV size slightly enlarged.   Disabled. Sedentary lifestyle. Light house chores. Not able to do yard work. Drives. Lives at home with wife.   Past Medical History:  Diagnosis Date   Chronic combined systolic  (congestive) and diastolic (congestive) heart failure ([place])    a. 01/2018 EF 35-40%; b. 10/2020 Echo: EF 40-45%, glob HK. RVSP 42.32mHg. Mildly dil LA. Mild MS/AoV sclerosis.   CKD (chronic kidney disease), stage Brown (HCC)    Coronary artery disease    a. 01/2018 late presenting inferior MI; b. 01/2018 Cath: Severe multivessel dzs-->CABG x 3 (LIMA->LAD, VG->OM2, VG->RPDA @ [facility]).   Diabetes mellitus without complication ([place]    a. Dx ~ 2000.   Heart palpitations    a. Pt reports prior nl echo's and stress tests. Last stress test w/in past 2 yrs - PCP.   High cholesterol    Hypertension    Mitral regurgitation    a. 01/2018 s/p MV repair @ time of CABG.   [facility]ocardial [facility]    Postoperative atrial fibrillation    a. 01/2018 @ time of CABG.   PSVT (paroxysmal supraventricular tachycardia) ([place]    a. Very symptomatic with multiple ED evaluations.  Has been on amio.   STEMI (ST elevation myocardial infarction) [place] 01[place]    Current Outpatient Medications on File Prior to Visit  Medication Sig Dispense Refill   amiodarone (PACERONE) 200 MG tablet Take 200 mg by mouth daily.     aspirin 81 MG EC tablet Take 81 mg by mouth daily.      b complex vitamins tablet Take 1 tablet by mouth daily.      Dulaglutide 1.5 MG/0.5ML SOPN Inject 1.5 mg into the skin every Saturday.     empagliflozin (  JARDIANCE) 25 MG TABS tablet Take 25 mg by mouth daily.     gabapentin (NEURONTIN) 100 MG capsule Take 1 capsule (100 mg total) by mouth 2 (two) times daily. 60 capsule 6   insulin degludec (TRESIBA) 200 UNIT/ML FlexTouch Pen 32 Units at bedtime.     losartan (COZAAR) 50 MG tablet Take 1 tablet by mouth daily.     metFORMIN (GLUCOPHAGE) 500 MG tablet Take 2 tablets (1,000 mg total) by mouth 2 (two) times daily. 360 tablet 2   metolazone (ZAROXOLYN) 2.5 MG tablet Take 1 tablet (2.5 mg) by mouth once a week as needed for weight gain greater than 3 lbs     metoprolol succinate (TOPROL-XL) 50 MG 24 hr  tablet Take 1 tablet (50 mg total) by mouth daily. Take with or immediately following a meal. 90 tablet 3   Omega-3 Fatty Acids (FISH OIL) 875 MG CAPS Take 875 mg by mouth 2 (two) times daily.     pramipexole (MIRAPEX) 1.5 MG tablet Take 1 tablet (1.5 mg total) by mouth 2 (two) times daily. 180 tablet 3   rosuvastatin (CRESTOR) 20 MG tablet Take 1 tablet (20 mg total) by mouth daily. 90 tablet 0   spironolactone (ALDACTONE) 25 MG tablet Take 1 tablet (25 mg total) by mouth daily. 90 tablet 1   torsemide (DEMADEX) 20 MG tablet Take 2 tablets (40 mg total) by mouth daily. 40 mg daily 180 tablet 3   No current facility-administered medications on file prior to visit.       Observations/Objective: Appears well , nad   Assessment and Plan: Moderate obstructive sleep apnea.  We reviewed his home sleep study results.  Patient has significant underlying cardiovascular comorbidities with congestive heart failure and A-fib.  We will begin CPAP therapy.  We will begin CPAP AutoSet 5 to 15 cm H2O.  Mask of choice.  Enroll in [place].  Patient education was given on sleep apnea and CPAP usage and care  - discussed how weight can impact sleep and risk for sleep disordered breathing - discussed options to assist with weight loss: combination of diet modification, cardiovascular and strength training exercises   - had an extensive discussion regarding the adverse health consequences related to untreated sleep disordered breathing - specifically discussed the risks for hypertension, coronary artery disease, cardiac dysrhythmias, cerebrovascular disease, and diabetes - lifestyle modification discussed   - discussed how sleep disruption can increase risk of accidents, particularly when driving - safe driving practices were discussed   Dyspnea questionable etiology.  Pulmonary function testing today shows no significant airflow obstruction or restriction.  He does have a slight decrease in diffusing capacity.   Patient is on amiodarone.  He has some minimum cough and intermittent wheezing.  Could be representative of some underlying amiodarone pulmonary toxicity.  We will set up for high-resolution CT chest to rule out underlying interstitial process, versus pneumonitis He has multiple comorbidities including congestive heart failure, diabetes, sedentary lifestyle suspect deconditioning is also a component.  We will have patient follow back up in the office in 6 to 8 weeks for follow-up.  On return could consider adding in albuterol inhaler as needed although low suspicion for asthma/reactive airways.  Congestive heart failure A-fib continue follow-up with cardiology  Plan  Patient Instructions  Set up HRCT chest .   Begin CPAP At bedtime  , wear all night long- for at least 6hr each night  Do not drive if sleepy  Work on healthy weight  Follow  up in office in 2 months and As needed  [facility] office )     Follow Up Instructions:    I discussed the assessment and treatment plan with the patient. The patient was provided an opportunity to ask questions and all were answered. The patient agreed with the plan and demonstrated an understanding of the instructions.   The patient was advised to call back or seek an in-person evaluation if the symptoms worsen or if the condition fails to improve as anticipated.  I provided 30  minutes of non-face-to-face time during this encounter.   Rexene Edison, NP

## 2022-02-11 NOTE — Telephone Encounter (Signed)
Dr. Halford Chessman has read pt home sleep study and has been sent to chart.

## 2022-02-11 NOTE — Telephone Encounter (Signed)
Ricardo Pita, MD  Claudette Head A, CMA He has moderate sleep apnea with an AHI of 15.9.  O2 sats do go as low as 73%.  I recommend auto CPAP with 5 to 20 cm of water pressure with mask of choice.   Dr. Darnell Level.          Lm for patient.

## 2022-02-11 NOTE — Addendum Note (Signed)
Addended by: Vanessa Barbara on: 02/11/2022 11:55 AM   Modules accepted: Orders

## 2022-02-11 NOTE — Patient Instructions (Addendum)
Set up HRCT chest .   Begin CPAP At bedtime  , wear all night long- for at least 6hr each night  Do not drive if sleepy  Work on healthy weight  Follow up in office in 2 months and As needed  [employer] office )

## 2022-02-12 ENCOUNTER — Ambulatory Visit: Payer: 59 | Admitting: Cardiology

## 2022-02-13 NOTE — Telephone Encounter (Signed)
Lm x2 for patient.  Letter mailed to address on file.  Will close encounter per office protocol.  

## 2022-02-23 NOTE — Progress Notes (Signed)
Agree with the details of the visit as noted by Tammy Parrett, NP.  C. Laura Sharnetta Gielow, MD [facility] 

## 2022-02-27 ENCOUNTER — Ambulatory Visit: Payer: 59

## 2022-03-05 ENCOUNTER — Ambulatory Visit
Admission: RE | Admit: 2022-03-05 | Discharge: 2022-03-05 | Disposition: A | Discharge status: Home / Self Care | Payer: 59 | Source: Ambulatory Visit | Attending: Adult Health | Admitting: Adult Health

## 2022-03-05 DIAGNOSIS — Z79899 Other long term (current) drug therapy: Secondary | ICD-10-CM | POA: Diagnosis present

## 2022-03-05 DIAGNOSIS — R0602 Shortness of breath: Secondary | ICD-10-CM | POA: Insufficient documentation

## 2022-03-05 DIAGNOSIS — R942 Abnormal results of pulmonary function studies: Secondary | ICD-10-CM | POA: Diagnosis present

## 2022-03-09 ENCOUNTER — Other Ambulatory Visit: Payer: Self-pay

## 2022-03-09 MED ORDER — SPIRONOLACTONE 25 MG PO TABS: 25.0000 mg | ORAL_TABLET | Freq: Every day | ORAL | 0 refills | Status: DC

## 2022-03-11 ENCOUNTER — Telehealth: Payer: Self-pay

## 2022-03-11 NOTE — Telephone Encounter (Signed)
Ricardo Needles, NP  03/06/2022  2:31 PM EDT     Great news CT scan shows no evidence of interstitial lung disease/pulmonary fibrosis. Essentially lungs are clear.  Does show some air trapping which can be seen with reactive airways/intermittent asthma.  We will discuss in full detail at follow-up visit.   Continue with office visit recommendations and follow-up    Patient is aware of results and voiced his understanding.  Nothing further needed.

## 2022-04-01 ENCOUNTER — Other Ambulatory Visit: Payer: Self-pay | Admitting: Cardiology

## 2022-04-01 MED ORDER — ROSUVASTATIN CALCIUM 20 MG PO TABS
20.0000 mg | ORAL_TABLET | Freq: Every day | ORAL | 0 refills | Status: DC
Start: 2022-04-01 — End: 2022-07-23

## 2022-04-07 NOTE — Progress Notes (Unsigned)
Electrophysiology Office Follow up Visit Note:    Date:  04/08/2022   ID:  Ricardo Brown, DOB [birth year], MRN [id number]  PCP:  [name], MD  [facility] Cardiologist:  [name], MD  [facility] Electrophysiologist:  Vickie Epley, MD    Interval History:    Ricardo Brown is a 61 y.o. male who presents for a follow up visit.  I last saw the patient October 08, 2021 for his history of chronic systolic heart failure, CKD 3, coronary disease, diabetes and atrial tachycardia.  He also carries a history of mitral regurgitation postrepair at the time of CABG in 2019.  He is managed with amiodarone and has done well with this medication regimen.  He previously underwent an EP study but his inducible atrial tachycardia was localized to the His bundle and ablation was deferred.  He tells me his rhythm has been much better on the amiodarone.  He is also been taking his metolazone daily since running out of spironolactone.  He tells me that this is helped his swelling significantly and his breathing has improved.  He is pleased with the results of the high-resolution CT scan of the chest.     Past Medical History:  Diagnosis Date   Chronic combined systolic (congestive) and diastolic (congestive) heart failure ([place])    a. 01/2018 EF 35-40%; b. 10/2020 Echo: EF 40-45%, glob HK. RVSP 42.30mHg. Mildly dil LA. Mild MS/AoV sclerosis.   CKD (chronic kidney disease), stage Brown (HCC)    Coronary artery disease    a. 01/2018 late presenting inferior MI; b. 01/2018 Cath: Severe multivessel dzs-->CABG x 3 (LIMA->LAD, VG->OM2, VG->RPDA @ [facility]).   Diabetes mellitus [place] complication [place]    a. Dx ~ 2000.   Heart palpitations    a. Pt reports prior nl echo's and stress tests. Last stress test w/in past 2 yrs - PCP.   High cholesterol    Hypertension    Mitral regurgitation    a. 01/2018 s/p MV repair @ time of CABG.   Myocardial infarction ([facility]    Postoperative  atrial fibrillation    a. 01/2018 @ time of CABG.   PSVT (paroxysmal supraventricular tachycardia) ([place]    a. Very symptomatic with multiple ED evaluations.  Has been on amio.   STEMI (ST elevation myocardial infarction) ([place] 01/24/2018    Past Surgical History:  Procedure Laterality Date   AMPUTATION TOE Right 08/29/2021   Procedure: AMPUTATION TOE-2nd toe;  Surgeon: CSharlotte Alamo DPM;  Location: [facility] ORS;  Service: Podiatry;  Laterality: Right;   AMPUTATION TOE Left 12/22/2021   Procedure: AMPUTATION TOE-2nd Toe Partial;  Surgeon: BCaroline More DPM;  Location: [facility] ORS;  Service: Podiatry;  Laterality: Left;   CARDIAC CATHETERIZATION     CARDIAC VALVE REPLACEMENT     Mitral Valve Repair   COLONOSCOPY WITH PROPOFOL N/A 06/07/2019   Procedure: COLONOSCOPY WITH PROPOFOL;  Surgeon: TVirgel Manifold MD;  Location: [facility] ENDOSCOPY;  Service: Endoscopy;  Laterality: N/A;   COLONOSCOPY WITH PROPOFOL N/A 02/07/2020   Procedure: COLONOSCOPY WITH PROPOFOL;  Surgeon: TVirgel Manifold MD;  Location: [facility] ENDOSCOPY;  Service: Endoscopy;  Laterality: N/A;   COLONOSCOPY WITH PROPOFOL N/A 12/11/2020   Procedure: COLONOSCOPY WITH PROPOFOL;  Surgeon: TVirgel Manifold MD;  Location: [facility] ENDOSCOPY;  Service: Endoscopy;  Laterality: N/A;   CORONARY ANGIOPLASTY     CORONARY ARTERY BYPASS GRAFT     x 3   01/2018   ESOPHAGOGASTRODUODENOSCOPY N/A 02/07/2020  Procedure: ESOPHAGOGASTRODUODENOSCOPY (EGD);  Surgeon: [name], MD;  Location: [facility] ENDOSCOPY;  Service: Endoscopy;  Laterality: N/A;   LEFT HEART CATH AND CORONARY ANGIOGRAPHY N/A 01/24/2018   Procedure: LEFT HEART CATH AND CORONARY ANGIOGRAPHY;  Surgeon: [name], MD;  Location: [address] CV LAB;  Service: Cardiovascular;  Laterality: N/A;   SVT ABLATION N/A 09/12/2021   Procedure: SVT ABLATION;  Surgeon: Vickie Epley, MD;  Location: [facility] CV LAB;  Service: Cardiovascular;  Laterality: N/A;   TONSILLECTOMY       Current Medications: Current Meds  Medication Sig   amiodarone (PACERONE) 200 MG tablet Take 200 mg by mouth daily.   aspirin 81 MG EC tablet Take 81 mg by mouth daily.    b complex vitamins tablet Take 1 tablet by mouth daily.    Dulaglutide 1.5 MG/0.5ML SOPN Inject 1.5 mg into the skin every Saturday.   empagliflozin (JARDIANCE) 25 MG TABS tablet Take 25 mg by mouth daily.   gabapentin (NEURONTIN) 100 MG capsule Take 1 capsule (100 mg total) by mouth 2 (two) times daily.   insulin degludec (TRESIBA) 200 UNIT/ML FlexTouch Pen 32 Units at bedtime.   losartan (COZAAR) 25 MG tablet Take 25 mg by mouth daily.   metFORMIN (GLUCOPHAGE) 500 MG tablet Take 2 tablets (1,000 mg total) by mouth 2 (two) times daily.   metolazone (ZAROXOLYN) 2.5 MG tablet Take 2.5 mg by mouth daily.   metoprolol succinate (TOPROL-XL) 50 MG 24 hr tablet Take 1 tablet (50 mg total) by mouth daily. Take with or immediately following a meal.   Omega-3 Fatty Acids (FISH OIL) 875 MG CAPS Take 875 mg by mouth 2 (two) times daily.   pramipexole (MIRAPEX) 1.5 MG tablet Take 1 tablet (1.5 mg total) by mouth 2 (two) times daily.   rosuvastatin (CRESTOR) 20 MG tablet Take 1 tablet (20 mg total) by mouth daily.   torsemide (DEMADEX) 20 MG tablet Take 2 tablets (40 mg total) by mouth daily. 40 mg daily   [DISCONTINUED] losartan (COZAAR) 50 MG tablet Take 1 tablet by mouth daily.   [DISCONTINUED] spironolactone (ALDACTONE) 25 MG tablet Take 1 tablet (25 mg total) by mouth daily.     Allergies:   Lipitor [atorvastatin], Sacubitril-valsartan, and Pregabalin   Social History   Socioeconomic History   Marital status: Married    Spouse name: Not on file   Number of children: Not on file   Years of education: Not on file   Highest education level: Not on file  Occupational History   Not on file  Tobacco Use   Smoking status: Never   Smokeless tobacco: Never  Vaping Use   Vaping Use: Never used  Substance and Sexual  Activity   Alcohol use: Not Currently    Comment: rare beer   Drug use: No   Sexual activity: Not on file  Other Topics Concern   Not on file  Social History Narrative   Lives locally with wife.  Works in [employer].  Does not routinely exercise.   Social Determinants of Health   Financial Resource Strain: Not on file  Food Insecurity: Not on file  Transportation Needs: Not on file  Physical Activity: Not on file  Stress: Not on file  Social Connections: Not on file     Family History: The patient's family history includes CAD in his father; Cancer in his father and mother; Other in his brother.  ROS:   Please see the history of present  illness.    All other systems reviewed and are negative.  EKGs/Labs/Other Studies Reviewed:    The following studies were reviewed today:  January 29, 2022 PFT Increased FEV1/FVC ratio, reduced lung volumes and diffusion defect suggest an interstitial process such as fibrosis or interstitial inflammation.  03/05/2022 CT chest high resolution IMPRESSION: 1. No evidence of fibrotic interstitial lung disease. 2. Bilateral air trapping, findings can be seen in the setting of small airways disease. 3. Aortic Atherosclerosis (ICD10-I70.0).  EKG today personally reviewed shows sinus rhythm with a right bundle branch block.  Ventricular rate 76 bpm.  Very low amplitude P waves.  Recent Labs: 07/11/2021: TSH 2.780 08/30/2021: Magnesium 2.2 12/19/2021: ALT 22; B Natriuretic Peptide 160.0 12/20/2021: BUN 42; Creatinine, Ser 1.80; Hemoglobin 10.4; Platelets 139; Potassium 4.4; Sodium 136  Recent Lipid Panel    Component Value Date/Time   CHOL 122 09/30/2020 1114   TRIG 121 09/30/2020 1114   HDL 35 (L) 09/30/2020 1114   CHOLHDL 3.5 09/30/2020 1114   VLDL 24 01/24/2018 1459   LDLCALC 67 09/30/2020 1114    Physical Exam:    VS:  BP 122/76   Pulse 76   Ht '5\' 8"'$  (1.727 m)   Wt 208 lb (94.3 kg)   SpO2 98%   BMI 31.63 kg/m     Wt Readings  from Last 3 Encounters:  04/08/22 208 lb (94.3 kg)  02/10/22 215 lb 12.8 oz (97.9 kg)  02/09/22 214 lb (97.1 kg)     GEN:  Well nourished, well developed in no acute distress HEENT: Normal NECK: No JVD; No carotid bruits LYMPHATICS: No lymphadenopathy CARDIAC: RRR, no murmurs, rubs, gallops RESPIRATORY:  Clear to auscultation without rales, wheezing or rhonchi  ABDOMEN: Soft, non-tender, non-distended MUSCULOSKELETAL: Improved 1+ pitting bilateral lower extremity edema in the ankles; No deformity  SKIN: Warm and dry NEUROLOGIC:  Alert and oriented x 3 PSYCHIATRIC:  Normal affect        ASSESSMENT:    1. HFrEF (heart failure with reduced ejection fraction) ([place])   2. SVT (supraventricular tachycardia) ([place])   3. Atrial tachycardia ([place])   4. Encounter for long-term (current) use of high-risk medication    PLAN:    In order of problems listed above:   #Chronic systolic heart failure NYHA class II-Brown.  Chronic dyspnea, unclear etiology.  Continue current medical therapy including spironolactone, metoprolol, losartan, Jardiance. He has been taking metolazone daily.  We will check his electrolytes and kidney function today.  He does feel much better on this regimen.  #SVT #Atrial tachycardia Doing well on amiodarone.  No evidence of amiodarone induced lung toxicity on recent PFT and high-resolution CT chest.  #Amiodarone monitoring Liver enzymes checked in May of this year.  We will repeat CMP, TSH and free T4 now.  Follow-up with an APP in 6 months.  Medication Adjustments/Labs and Tests Ordered: Current medicines are reviewed at length with the patient today.  Concerns regarding medicines are outlined above.  No orders of the defined types were placed in this encounter.  No orders of the defined types were placed in this encounter.    Signed, Lars Mage, MD, [facility], [facility] 04/08/2022 9:27 AM    Electrophysiology [facility]

## 2022-04-08 ENCOUNTER — Other Ambulatory Visit
Admission: RE | Admit: 2022-04-08 | Discharge: 2022-04-08 | Disposition: A | Discharge status: Home / Self Care | Payer: 59 | Source: Ambulatory Visit | Attending: Cardiology | Admitting: Cardiology

## 2022-04-08 ENCOUNTER — Encounter: Payer: Self-pay | Admitting: Cardiology

## 2022-04-08 ENCOUNTER — Ambulatory Visit: Payer: 59 | Attending: Cardiology | Admitting: Cardiology

## 2022-04-08 VITALS — BP 122/76 | HR 76 | Ht 68.0 in | Wt 208.0 lb

## 2022-04-08 DIAGNOSIS — I471 Supraventricular tachycardia: Secondary | ICD-10-CM

## 2022-04-08 DIAGNOSIS — Z79899 Other long term (current) drug therapy: Secondary | ICD-10-CM | POA: Diagnosis not present

## 2022-04-08 DIAGNOSIS — I5020 Unspecified systolic (congestive) heart failure: Secondary | ICD-10-CM | POA: Diagnosis present

## 2022-04-08 LAB — COMPREHENSIVE METABOLIC PANEL
ALT: 24 U/L (ref 0–44)
AST: 23 U/L (ref 15–41)
Albumin: 4.4 g/dL (ref 3.5–5.0)
Alkaline Phosphatase: 41 U/L (ref 38–126)
Anion gap: 10 (ref 5–15)
BUN: 49 mg/dL — ABNORMAL HIGH (ref 6–20)
CO2: 29 mmol/L (ref 22–32)
Calcium: 9.9 mg/dL (ref 8.9–10.3)
Chloride: 98 mmol/L (ref 98–111)
Creatinine, Ser: 1.85 mg/dL — ABNORMAL HIGH (ref 0.61–1.24)
GFR, Estimated: 41 mL/min — ABNORMAL LOW (ref >60)
Glucose, Bld: 131 mg/dL — ABNORMAL HIGH (ref 70–99)
Potassium: 4.3 mmol/L (ref 3.5–5.1)
Sodium: 137 mmol/L (ref 135–145)
Total Bilirubin: 0.8 mg/dL (ref 0.3–1.2)
Total Protein: 7.5 g/dL (ref 6.5–8.1)

## 2022-04-08 LAB — T4, FREE: Free T4: 1.09 ng/dL (ref 0.61–1.12)

## 2022-04-08 LAB — TSH: TSH: 2.842 u[IU]/mL (ref 0.350–4.500)

## 2022-04-08 NOTE — Patient Instructions (Signed)
Medication Instructions:  none *If you need a refill on your cardiac medications before your next appointment, please call your pharmacy*   Lab Work: CMP, TSH, Free T4 If you have labs (blood work) drawn today and your tests are completely normal, you will receive your results only by: [place] (if you have MyChart) OR A paper copy in the mail If you have any lab test that is abnormal or we need to change your treatment, we will call you to review the results.   Testing/Procedures: none   Follow-Up: At [facility], you and your health needs are our priority.  As part of our continuing mission to provide you with exceptional heart care, we have created designated Provider Care Teams.  These Care Teams include your primary Cardiologist (physician) and Advanced Practice Providers (APPs -  Physician Assistants and Nurse Practitioners) who all work together to provide you with the care you need, when you need it.  We recommend signing up for the patient portal called "MyChart".  Sign up information is provided on this After Visit Summary.  MyChart is used to connect with patients for Virtual Visits (Telemedicine).  Patients are able to view lab/test results, encounter notes, upcoming appointments, etc.  Non-urgent messages can be sent to your provider as well.   To learn more about what you can do with MyChart, go to [email or web address].    Your next appointment:   6 month(s)  The format for your next appointment:   In Person  Provider:   You will see one of the following Advanced Practice Providers on your designated Care Team:   Murray Hodgkins, NP Christell Faith, PA-C Cadence Kathlen Mody, PA-C Gerrie Nordmann, NP      Other Instructions none  Important Information About Sugar

## 2022-04-08 NOTE — Addendum Note (Signed)
Addended by: Darrell Jewel on: 04/08/2022 09:40 AM   Modules accepted: Orders

## 2022-04-13 ENCOUNTER — Ambulatory Visit (INDEPENDENT_AMBULATORY_CARE_PROVIDER_SITE_OTHER): Payer: 59 | Admitting: Internal Medicine

## 2022-04-13 ENCOUNTER — Encounter: Payer: Self-pay | Admitting: Internal Medicine

## 2022-04-13 VITALS — BP 112/69 | HR 78 | Ht 68.0 in | Wt 208.4 lb

## 2022-04-13 DIAGNOSIS — I5082 Biventricular heart failure: Secondary | ICD-10-CM

## 2022-04-13 DIAGNOSIS — E119 Type 2 diabetes mellitus without complications: Secondary | ICD-10-CM

## 2022-04-13 DIAGNOSIS — E1122 Type 2 diabetes mellitus with diabetic chronic kidney disease: Secondary | ICD-10-CM

## 2022-04-13 DIAGNOSIS — I25810 Atherosclerosis of coronary artery bypass graft(s) without angina pectoris: Secondary | ICD-10-CM

## 2022-04-13 DIAGNOSIS — G629 Polyneuropathy, unspecified: Secondary | ICD-10-CM

## 2022-04-13 DIAGNOSIS — E1169 Type 2 diabetes mellitus with other specified complication: Secondary | ICD-10-CM

## 2022-04-13 DIAGNOSIS — N1832 Chronic kidney disease, stage 3b: Secondary | ICD-10-CM

## 2022-04-13 DIAGNOSIS — Z794 Long term (current) use of insulin: Secondary | ICD-10-CM

## 2022-04-13 LAB — GLUCOSE, POCT (MANUAL RESULT ENTRY): POC Glucose: 123 mg/dL — AB (ref 70–99)

## 2022-04-13 NOTE — Assessment & Plan Note (Signed)
No angina 

## 2022-04-13 NOTE — Progress Notes (Signed)
Established Patient Office Visit  Subjective:  Patient ID: Ricardo Brown, male    DOB: [birth year]  Age: 61 y.o. MRN: [id number]  CC:  Chief Complaint  Patient presents with   Diabetes    Diabetes Hypoglycemia symptoms include dizziness. Pertinent negatives for diabetes include no chest pain.  Loss of Consciousness This is a new problem. The current episode started more than 1 month ago. Nothing aggravates the symptoms. Associated symptoms include dizziness and palpitations. Pertinent negatives include no abdominal pain, auditory change, aura, back pain, bladder incontinence, bowel incontinence, chest pain, clumsiness, diaphoresis, light-headedness or vomiting. His past medical history is significant for arrhythmia.    Ricardo Brown presents for diabetes check , bs  is 14  Past Medical History:  Diagnosis Date   Chronic combined systolic (congestive) and diastolic (congestive) heart failure ([place])    a. 01/2018 EF 35-40%; b. 10/2020 Echo: EF 40-45%, glob HK. RVSP 42.32mHg. Mildly dil LA. Mild MS/AoV sclerosis.   CKD (chronic kidney disease), stage Brown (HCC)    Coronary artery disease    a. 01/2018 late presenting inferior MI; b. 01/2018 Cath: Severe multivessel dzs-->CABG x 3 (LIMA->LAD, VG->OM2, VG->RPDA @ [facility]).   Diabetes mellitus without complication ([place]    a. Dx ~ 2000.   Heart palpitations    a. Pt reports prior nl echo's and stress tests. Last stress test w/in past 2 yrs - PCP.   High cholesterol    Hypertension    Mitral regurgitation    a. 01/2018 s/p MV repair @ time of CABG.   Myocardial infarction ([facility]    Postoperative atrial fibrillation    a. 01/2018 @ time of CABG.   PSVT (paroxysmal supraventricular tachycardia) ([place]    a. Very symptomatic with multiple ED evaluations.  Has been on amio.   STEMI (ST elevation myocardial infarction) ([place] 01/24/2018    Past Surgical History:  Procedure Laterality Date   AMPUTATION TOE Right 08/29/2021    Procedure: AMPUTATION TOE-2nd toe;  Surgeon: CSharlotte Alamo DPM;  Location: [facility] ORS;  Service: Podiatry;  Laterality: Right;   AMPUTATION TOE Left 12/22/2021   Procedure: AMPUTATION TOE-2nd Toe Partial;  Surgeon: BCaroline More DPM;  Location: [facility] ORS;  Service: Podiatry;  Laterality: Left;   CARDIAC CATHETERIZATION     CARDIAC VALVE REPLACEMENT     Mitral Valve Repair   COLONOSCOPY WITH PROPOFOL N/A 06/07/2019   Procedure: COLONOSCOPY WITH PROPOFOL;  Surgeon: TVirgel Manifold MD;  Location: [facility] ENDOSCOPY;  Service: Endoscopy;  Laterality: N/A;   COLONOSCOPY WITH PROPOFOL N/A 02/07/2020   Procedure: COLONOSCOPY WITH PROPOFOL;  Surgeon: TVirgel Manifold MD;  Location: [facility] ENDOSCOPY;  Service: Endoscopy;  Laterality: N/A;   COLONOSCOPY WITH PROPOFOL N/A 12/11/2020   Procedure: COLONOSCOPY WITH PROPOFOL;  Surgeon: TVirgel Manifold MD;  Location: [facility] ENDOSCOPY;  Service: Endoscopy;  Laterality: N/A;   CORONARY ANGIOPLASTY     CORONARY ARTERY BYPASS GRAFT     x 3   01/2018   ESOPHAGOGASTRODUODENOSCOPY N/A 02/07/2020   Procedure: ESOPHAGOGASTRODUODENOSCOPY (EGD);  Surgeon: TVirgel Manifold MD;  [facility];  Service: Endoscopy;  Laterality: N/A;   LEFT HEART CATH AND CORONARY ANGIOGRAPHY N/A 01/24/2018   Procedure: LEFT HEART CATH AND CORONARY ANGIOGRAPHY;  Surgeon: AWellington Hampshire MD;  [place];  Service: Cardiovascular;  Laterality: N/A;   SVT ABLATION N/A 09/12/2021   Procedure: SVT ABLATION;  Surgeon: LVickie Epley MD;  [place];  Service: Cardiovascular;  Laterality: N/A;   TONSILLECTOMY      Family History  Problem Relation Age of Onset   Cancer Mother        died @ 13   CAD Father        First MI @ 57. S/p heart transplant. Died in mid-50's of cancer.   Cancer Father    Other Brother        alive and well    Social History   Socioeconomic History   Marital status: Married    Spouse name: Not on file    Number of children: Not on file   Years of education: Not on file   Highest education level: Not on file  Occupational History   Not on file  Tobacco Use   Smoking status: Never   Smokeless tobacco: Never  Vaping Use   Vaping Use: Never used  Substance and Sexual Activity   Alcohol use: Not Currently    Comment: rare beer   Drug use: No   Sexual activity: Not on file  Other Topics Concern   Not on file  Social History Narrative   Lives locally with wife.  Works in [employer].  Does not routinely exercise.   Social Determinants of Health   Financial Resource Strain: Not on file  Food Insecurity: Not on file  Transportation Needs: Not on file  Physical Activity: Not on file  Stress: Not on file  Social Connections: Not on file  Intimate Partner Violence: Not on file     Current Outpatient Medications:    amiodarone (PACERONE) 200 MG tablet, Take 200 mg by mouth daily., Disp: , Rfl:    aspirin 81 MG EC tablet, Take 81 mg by mouth daily. , Disp: , Rfl:    b complex vitamins tablet, Take 1 tablet by mouth daily. , Disp: , Rfl:    Dulaglutide 1.5 MG/0.5ML SOPN, Inject 1.5 mg into the skin every Saturday., Disp: , Rfl:    empagliflozin (JARDIANCE) 25 MG TABS tablet, Take 25 mg by mouth daily., Disp: , Rfl:    gabapentin (NEURONTIN) 100 MG capsule, Take 1 capsule (100 mg total) by mouth 2 (two) times daily., Disp: 60 capsule, Rfl: 6   insulin degludec (TRESIBA) 200 UNIT/ML FlexTouch Pen, 32 Units at bedtime., Disp: , Rfl:    losartan (COZAAR) 25 MG tablet, Take 25 mg by mouth daily., Disp: , Rfl:    metFORMIN (GLUCOPHAGE) 500 MG tablet, Take 2 tablets (1,000 mg total) by mouth 2 (two) times daily., Disp: 360 tablet, Rfl: 2   metolazone (ZAROXOLYN) 2.5 MG tablet, Take 2.5 mg by mouth daily., Disp: , Rfl:    metoprolol succinate (TOPROL-XL) 50 MG 24 hr tablet, Take 1 tablet (50 mg total) by mouth daily. Take with or immediately following a meal., Disp: 90 tablet, Rfl: 3    Omega-3 Fatty Acids (FISH OIL) 875 MG CAPS, Take 875 mg by mouth 2 (two) times daily., Disp: , Rfl:    pramipexole (MIRAPEX) 1.5 MG tablet, Take 1 tablet (1.5 mg total) by mouth 2 (two) times daily., Disp: 180 tablet, Rfl: 3   rosuvastatin (CRESTOR) 20 MG tablet, Take 1 tablet (20 mg total) by mouth daily., Disp: 90 tablet, Rfl: 0   torsemide (DEMADEX) 20 MG tablet, Take 2 tablets (40 mg total) by mouth daily. 40 mg daily, Disp: 180 tablet, Rfl: 3   Allergies  Allergen Reactions   Lipitor [Atorvastatin] Palpitations    SVT   Sacubitril-Valsartan Other (  See Comments)    SVT Other reaction(s): Other (see comments) Other reaction(s): Other (See Comments) SVT SVT   Pregabalin Palpitations    SVT Other reaction(s): Other (see comments) SVT SVT    ROS Review of Systems  Constitutional: Negative.  Negative for diaphoresis.  HENT: Negative.    Eyes: Negative.   Respiratory: Negative.    Cardiovascular:  Positive for palpitations and syncope. Negative for chest pain.  Gastrointestinal: Negative.  Negative for abdominal pain, bowel incontinence and vomiting.  Endocrine: Negative.   Genitourinary: Negative.  Negative for bladder incontinence.  Musculoskeletal: Negative.  Negative for back pain.  Skin: Negative.   Allergic/Immunologic: Negative.   Neurological:  Positive for dizziness. Negative for light-headedness.  Hematological: Negative.   Psychiatric/Behavioral: Negative.    All other systems reviewed and are negative.     Objective:    Physical Exam Vitals reviewed.  Constitutional:      Appearance: Normal appearance.  HENT:     Mouth/Throat:     Mouth: Mucous membranes are moist.  Eyes:     Pupils: Pupils are equal, round, and reactive to light.  Neck:     Vascular: No carotid bruit.  Cardiovascular:     Rate and Rhythm: Normal rate and regular rhythm.     Pulses: Normal pulses.     Heart sounds: Normal heart sounds.  Pulmonary:     Effort: Pulmonary effort is  normal.     Breath sounds: Normal breath sounds.  Abdominal:     General: Bowel sounds are normal.     Palpations: Abdomen is soft. There is no hepatomegaly, splenomegaly or mass.     Tenderness: There is no abdominal tenderness.     Hernia: No hernia is present.  Musculoskeletal:     Cervical back: Neck supple.     Right lower leg: No edema.     Left lower leg: No edema.  Skin:    Findings: No rash.  Neurological:     Mental Status: He is alert and oriented to person, place, and time.     Motor: No weakness.  Psychiatric:        Mood and Affect: Mood normal.        Behavior: Behavior normal.     BP 112/69   Pulse 78   Ht '5\' 8"'  (1.727 m)   Wt 208 lb 6.4 oz (94.5 kg)   BMI 31.69 kg/m  Wt Readings from Last 3 Encounters:  04/13/22 208 lb 6.4 oz (94.5 kg)  04/08/22 208 lb (94.3 kg)  02/10/22 215 lb 12.8 oz (97.9 kg)     Health Maintenance Due  Topic Date Due   Hepatitis C Screening  Never done   OPHTHALMOLOGY EXAM  10/01/2021    There are no preventive care reminders to display for this patient.  Lab Results  Component Value Date   TSH 2.842 04/08/2022   Lab Results  Component Value Date   WBC 19.6 (H) 12/20/2021   HGB 10.4 (L) 12/20/2021   HCT 31.7 (L) 12/20/2021   MCV 89.8 12/20/2021   PLT 139 (L) 12/20/2021   Lab Results  Component Value Date   NA 137 04/08/2022   K 4.3 04/08/2022   CO2 29 04/08/2022   GLUCOSE 131 (H) 04/08/2022   BUN 49 (H) 04/08/2022   CREATININE 1.85 (H) 04/08/2022   BILITOT 0.8 04/08/2022   ALKPHOS 41 04/08/2022   AST 23 04/08/2022   ALT 24 04/08/2022   PROT 7.5 04/08/2022   ALBUMIN  4.4 04/08/2022   CALCIUM 9.9 04/08/2022   ANIONGAP 10 04/08/2022   EGFR 29 (L) 08/26/2021   Lab Results  Component Value Date   CHOL 122 09/30/2020   Lab Results  Component Value Date   HDL 35 (L) 09/30/2020   Lab Results  Component Value Date   LDLCALC 67 09/30/2020   Lab Results  Component Value Date   TRIG 121 09/30/2020   Lab  Results  Component Value Date   CHOLHDL 3.5 09/30/2020   Lab Results  Component Value Date   HGBA1C 7.4 (H) 08/28/2021      Assessment & Plan:   Problem List Items Addressed This Visit       Cardiovascular and Mediastinum   Coronary artery disease    No angina      Congestive heart failure (HCC)    Stable at the present time        Endocrine   Type 2 diabetes mellitus with diabetic chronic kidney disease (HCC)    Blood sugars under control        Nervous and Auditory   Neuropathy    Stable      Other Visit Diagnoses     Type 2 diabetes mellitus without complication, without long-term current use of insulin (HCC)    -  Primary   Relevant Orders   POCT glucose (manual entry) (Completed)     Hypercholesterolemia  I advised the patient to follow Mediterranean diet This diet is rich in fruits vegetables and whole grain, and This diet is also rich in fish and lean meat Patient should also eat a handful of almonds or walnuts daily Recent heart study indicated that average follow-up on this kind of diet reduces the cardiovascular mortality by 50 to 70%== - The patient's blood sugar is labile on med. - The patient will continue the current treatment regimen.  - I encouraged the patient to regularly check blood sugar.  - I encouraged the patient to monitor diet. I encouraged the patient to eat low-carb and low-sugar to help prevent blood sugar spikes.  - I encouraged the patient to continue following their prescribed treatment plan for diabetes - I informed the patient to get help if blood sugar drops below 74m/dL, or if suddenly have trouble thinking clearly or breathing.  Patient was advised to buy a book on diabetes from a local bookstore or from [place]  Patient should read 2 chapters every day to keep the motivation going, this is in addition to some of the materials we provided them from the office.  There are other resources on the Internet like YouTube and  wilkipedia to get an education on the diabetes  No orders of the defined types were placed in this encounter.   Follow-up: No follow-ups on file.    JCletis Athens MD

## 2022-04-13 NOTE — Assessment & Plan Note (Signed)
Stable

## 2022-04-13 NOTE — Assessment & Plan Note (Signed)
Stable at the present time. 

## 2022-04-13 NOTE — Assessment & Plan Note (Signed)
Blood sugars under control

## 2022-04-30 ENCOUNTER — Encounter: Payer: Self-pay | Admitting: Cardiology

## 2022-04-30 ENCOUNTER — Ambulatory Visit: Payer: 59 | Attending: Cardiology | Admitting: Cardiology

## 2022-04-30 VITALS — BP 130/70 | HR 72 | Ht 68.0 in | Wt 209.2 lb

## 2022-04-30 DIAGNOSIS — I471 Supraventricular tachycardia: Secondary | ICD-10-CM | POA: Diagnosis not present

## 2022-04-30 DIAGNOSIS — Z951 Presence of aortocoronary bypass graft: Secondary | ICD-10-CM

## 2022-04-30 DIAGNOSIS — I10 Essential (primary) hypertension: Secondary | ICD-10-CM

## 2022-04-30 DIAGNOSIS — I5020 Unspecified systolic (congestive) heart failure: Secondary | ICD-10-CM | POA: Diagnosis not present

## 2022-04-30 NOTE — Patient Instructions (Signed)
Medication Instructions:   Your physician recommends that you continue on your current medications as directed. Please refer to the Current Medication list given to you today.  *If you need a refill on your cardiac medications before your next appointment, please call your pharmacy*   Testing/Procedures:  Your physician has requested that you have an echocardiogram in 5 months. Echocardiography is a painless test that uses sound waves to create images of your heart. It provides your doctor with information about the size and shape of your heart and how well your heart's chambers and valves are working. This procedure takes approximately one hour. There are no restrictions for this procedure.    Follow-Up: At [facility], you and your health needs are our priority.  As part of our continuing mission to provide you with exceptional heart care, we have created designated Provider Care Teams.  These Care Teams include your primary Cardiologist (physician) and Advanced Practice Providers (APPs -  Physician Assistants and Nurse Practitioners) who all work together to provide you with the care you need, when you need it.  We recommend signing up for the patient portal called "MyChart".  Sign up information is provided on this After Visit Summary.  MyChart is used to connect with patients for Virtual Visits (Telemedicine).  Patients are able to view lab/test results, encounter notes, upcoming appointments, etc.  Non-urgent messages can be sent to your provider as well.   To learn more about what you can do with MyChart, go to [email or web address].    Your next appointment:   6 month(s)  The format for your next appointment:   In Person  Provider:   You may see [name], MD or one of the following Advanced Practice Providers on your designated Care Team:   Murray Hodgkins, NP Christell Faith, PA-C Cadence Kathlen Mody, PA-C Gerrie Nordmann, NP    Other Instructions   Important  Information About Sugar

## 2022-04-30 NOTE — Progress Notes (Signed)
Cardiology Office Note:    Date:  04/30/2022   ID:  Ricardo Brown, DOB May 28, 1961, MRN [id number]  PCP:  [name], MD  Cardiologist:  [name], MD  Electrophysiologist:  Vickie Epley, MD   Referring MD: [name], MD   Chief Complaint  Patient presents with   Follow-up    4 month f/u, no new cardiac concerns     History of Present Illness:    Ricardo Brown is a 61 y.o. male with a hx of SVT on amio , CAD/CABG x3 in 2019, HFrEF EF 40-45%, hypertension, hyperlipidemia, diabetes, CKD-3 who presents for follow-up.   Being seen due to history of CAD and cardiomyopathy.  Ran out of Aldactone, stop taking.  Blood pressures have been running low, losartan previously decreased to 25 mg daily.  Systolics sometimes as low as 80.  Compliant with Toprol-XL, losartan, metolazone and torsemide as prescribed.  States current diuretics adequately managing edema.  Breathing is better.  Denies palpitations.   Prior notes Echo 12/2021 EF 45 to 50% Echo 10/2020 EF 40 to 41%, grade 2 diastolic dysfunction. Patient was seen in the [place] in 06/ 2019 due to nausea, weakness, myalgias.  EKG at the time showed inferior ST elevation with reciprocal changes in the anterior leads and small inferior Q waves.  Patient was deemed late presenting STEMI.   Left heart cath was performed, briefly showed occluded distal RCA, 90% mid LAD, 90% proximal left circumflex.  Due to patient being a diabetic, and left heart cath showing diffuse three-vessel disease, CABG was recommended.  He underwent coronary artery bypass grafting x3 in July 2019 at [facility].  Delene Loll previously tried on patient, he did not tolerate.  He developed feeling of unwell, went into SVT and was taken to the emergency room.  Patient with history of SVT, who was seen in the emergency room and adenosine given x2 and placed on amiodarone  Echo on 09/2019 showed mild to moderately reduced ejection  fraction, EF 40 to 45%.  Past Medical History:  Diagnosis Date   Chronic combined systolic (congestive) and diastolic (congestive) heart failure ([place])    a. 01/2018 EF 35-40%; b. 10/2020 Echo: EF 40-45%, glob HK. RVSP 42.55mHg. Mildly dil LA. Mild MS/AoV sclerosis.   CKD (chronic kidney disease), stage Brown (HCC)    Coronary artery disease    a. 01/2018 late presenting inferior MI; b. 01/2018 Cath: Severe multivessel dzs-->CABG x 3 (LIMA->LAD, VG->OM2, VG->RPDA @ [facility]).   Diabetes mellitus without complication ([facility]    a. Dx ~ 2000.   Heart palpitations    a. Pt reports prior nl echo's and stress tests. Last stress test w/in past 2 yrs - PCP.   High cholesterol    Hypertension    Mitral regurgitation    a. 01/2018 s/p MV repair @ time of CABG.   Myocardial infarction ([facility]    Postoperative atrial fibrillation    a. 01/2018 @ time of CABG.   PSVT (paroxysmal supraventricular tachycardia) ([facility]    a. Very symptomatic with multiple ED evaluations.  Has been on amio.   STEMI (ST elevation myocardial infarction) ([facility] 01/24/2018    Past Surgical History:  Procedure Laterality Date   AMPUTATION TOE Right 08/29/2021   Procedure: AMPUTATION TOE-2nd toe;  Surgeon: CSharlotte Alamo DPM;  Location: [facility] ORS;  Service: Podiatry;  Laterality: Right;   AMPUTATION TOE Left 12/22/2021   Procedure: AMPUTATION TOE-2nd Toe Partial;  Surgeon: BCaroline More DPM;  Location:  [facility] ORS;  Service: Podiatry;  Laterality: Left;   CARDIAC CATHETERIZATION     CARDIAC VALVE REPLACEMENT     Mitral Valve Repair   COLONOSCOPY WITH PROPOFOL N/A 06/07/2019   Procedure: COLONOSCOPY WITH PROPOFOL;  Surgeon: [name], MD;  Location: [facility] ENDOSCOPY;  Service: Endoscopy;  Laterality: N/A;   COLONOSCOPY WITH PROPOFOL N/A 02/07/2020   Procedure: COLONOSCOPY WITH PROPOFOL;  Surgeon: [name], MD;  Location: [facility] ENDOSCOPY;  Service: Endoscopy;  Laterality: N/A;   COLONOSCOPY WITH PROPOFOL N/A 12/11/2020    Procedure: COLONOSCOPY WITH PROPOFOL;  Surgeon: [name], MD;  Location: [facility] ENDOSCOPY;  Service: Endoscopy;  Laterality: N/A;   CORONARY ANGIOPLASTY     CORONARY ARTERY BYPASS GRAFT     x 3   01/2018   ESOPHAGOGASTRODUODENOSCOPY N/A 02/07/2020   Procedure: ESOPHAGOGASTRODUODENOSCOPY (EGD);  Surgeon: [name], MD;  Location: [facility] ENDOSCOPY;  Service: Endoscopy;  Laterality: N/A;   LEFT HEART CATH AND CORONARY ANGIOGRAPHY N/A 01/24/2018   Procedure: LEFT HEART CATH AND CORONARY ANGIOGRAPHY;  Surgeon: [name], MD;  Location: [place] CV LAB;  Service: Cardiovascular;  Laterality: N/A;   SVT ABLATION N/A 09/12/2021   Procedure: SVT ABLATION;  Surgeon: Vickie Epley, MD;  Location: [place] [facility];  Service: Cardiovascular;  Laterality: N/A;   TONSILLECTOMY      Current Medications: Current Meds  Medication Sig   amiodarone (PACERONE) 200 MG tablet Take 200 mg by mouth daily.   aspirin 81 MG EC tablet Take 81 mg by mouth daily.    b complex vitamins tablet Take 1 tablet by mouth daily.    Dulaglutide 1.5 MG/0.5ML SOPN Inject 1.5 mg into the skin every Saturday.   empagliflozin (JARDIANCE) 25 MG TABS tablet Take 25 mg by mouth daily.   gabapentin (NEURONTIN) 100 MG capsule Take 1 capsule (100 mg total) by mouth 2 (two) times daily.   insulin degludec (TRESIBA) 200 UNIT/ML FlexTouch Pen 32 Units at bedtime.   losartan (COZAAR) 25 MG tablet Take 25 mg by mouth daily.   metFORMIN (GLUCOPHAGE) 500 MG tablet Take 2 tablets (1,000 mg total) by mouth 2 (two) times daily.   metolazone (ZAROXOLYN) 2.5 MG tablet Take 2.5 mg by mouth daily.   metoprolol succinate (TOPROL-XL) 50 MG 24 hr tablet Take 1 tablet (50 mg total) by mouth daily. Take with or immediately following a meal.   Omega-3 Fatty Acids (FISH OIL) 875 MG CAPS Take 875 mg by mouth 2 (two) times daily.   pramipexole (MIRAPEX) 1.5 MG tablet Take 1 tablet (1.5 mg total) by mouth 2 (two) times daily.    rosuvastatin (CRESTOR) 20 MG tablet Take 1 tablet (20 mg total) by mouth daily.   torsemide (DEMADEX) 20 MG tablet Take 2 tablets (40 mg total) by mouth daily. 40 mg daily     Allergies:   Lipitor [atorvastatin], Sacubitril-valsartan, and Pregabalin   Social History   Socioeconomic History   Marital status: Married    Spouse name: Not on file   Number of children: Not on file   Years of education: Not on file   Highest education level: Not on file  Occupational History   Not on file  Tobacco Use   Smoking status: Never   Smokeless tobacco: Never  Vaping Use   Vaping Use: Never used  Substance and Sexual Activity   Alcohol use: Not Currently    Comment: rare beer   Drug use: No   Sexual activity:  Not on file  Other Topics Concern   Not on file  Social History Narrative   Lives locally with wife.  Works in [employer].  Does not routinely exercise.   Social Determinants of Health   Financial Resource Strain: Not on file  Food Insecurity: Not on file  Transportation Needs: Not on file  Physical Activity: Not on file  Stress: Not on file  Social Connections: Not on file     Family History: The patient's family history includes CAD in his father; Cancer in his father and mother; Other in his brother.  ROS:   Please see the history of present illness.     All other systems reviewed and are negative.  EKGs/Labs/Other Studies Reviewed:    The following studies were reviewed today:   EKG:  EKG is ordered today.  EKG shows normal sinus rhythm, right bundle branch block.  Recent Labs: 08/30/2021: Magnesium 2.2 12/19/2021: B Natriuretic Peptide 160.0 12/20/2021: Hemoglobin 10.4; Platelets 139 04/08/2022: ALT 24; BUN 49; Creatinine, Ser 1.85; Potassium 4.3; Sodium 137; TSH 2.842  Recent Lipid Panel    Component Value Date/Time   CHOL 122 09/30/2020 1114   TRIG 121 09/30/2020 1114   HDL 35 (L) 09/30/2020 1114   CHOLHDL 3.5 09/30/2020 1114   VLDL 24 01/24/2018 1459    LDLCALC 67 09/30/2020 1114    Physical Exam:    VS:  BP 130/70 (BP Location: Left Arm, Patient Position: Sitting, Cuff Size: Normal)   Pulse 72   Ht '5\' 8"'$  (1.727 m)   Wt 209 lb 3.2 oz (94.9 kg)   SpO2 99%   BMI 31.81 kg/m     Wt Readings from Last 3 Encounters:  04/30/22 209 lb 3.2 oz (94.9 kg)  04/13/22 208 lb 6.4 oz (94.5 kg)  04/08/22 208 lb (94.3 kg)     GEN:  Well nourished, well developed in no acute distress HEENT: Normal NECK: No JVD; No carotid bruits CARDIAC: RRR, no murmurs, rubs, gallops RESPIRATORY:  Clear to auscultation without rales, wheezing or rhonchi  ABDOMEN: Soft, non-tender, non-distended MUSCULOSKELETAL:  1+ edema; No deformity  SKIN: Warm and dry NEUROLOGIC:  Alert and oriented x 3 PSYCHIATRIC:  Normal affect   ASSESSMENT:    1. Hx of CABG   2. HFrEF (heart failure with reduced ejection fraction) ([place])   3. Primary hypertension   4. SVT (supraventricular tachycardia)    PLAN:    In order of problems listed above:  CAD status post CABG x3 in 2019. Denies chest pain.  Continue aspirin and Crestor. ICM, last echo with improvement in EF 45 to 50% describes NYHA class II-Brown symptoms.  Continue losartan 25 mg daily, Toprol-XL 50 mg daily, Jardiance, metolazone 2.5 mg daily.  Repeat echocardiogram in 6 months.  Did not tolerate Entresto in the past, went into SVT.  Holding Aldactone due to renal dysfunction. Hypertension, BP controlled.  Continue Toprol-XL, losartan, Aldactone 25 mg daily, torsemide.  SVT, currently in sinus rhythm on amiodarone.  Continue amiodarone 200 mg daily.  Follow-up in 6 months  Total encounter time 40 minutes  Greater than 50% was spent in counseling and coordination of care with the patient   Medication Adjustments/Labs and Tests Ordered: Current medicines are reviewed at length with the patient today.  Concerns regarding medicines are outlined above.  Orders Placed This Encounter  Procedures   ECHOCARDIOGRAM  COMPLETE     No orders of the defined types were placed in this encounter.  Patient Instructions  Medication Instructions:   Your physician recommends that you continue on your current medications as directed. Please refer to the Current Medication list given to you today.  *If you need a refill on your cardiac medications before your next appointment, please call your pharmacy*   Testing/Procedures:  Your physician has requested that you have an echocardiogram in 5 months. Echocardiography is a painless test that uses sound waves to create images of your heart. It provides your doctor with information about the size and shape of your heart and how well your heart's chambers and valves are working. This procedure takes approximately one hour. There are no restrictions for this procedure.    Follow-Up: At [facility], you and your health needs are our priority.  As part of our continuing mission to provide you with exceptional heart care, we have created designated Provider Care Teams.  These Care Teams include your primary Cardiologist (physician) and Advanced Practice Providers (APPs -  Physician Assistants and Nurse Practitioners) who all work together to provide you with the care you need, when you need it.  We recommend signing up for the patient portal called "MyChart".  Sign up information is provided on this After Visit Summary.  MyChart is used to connect with patients for Virtual Visits (Telemedicine).  Patients are able to view lab/test results, encounter notes, upcoming appointments, etc.  Non-urgent messages can be sent to your provider as well.   To learn more about what you can do with MyChart, go to [email or web address].    Your next appointment:   6 month(s)  The format for your next appointment:   In Person  Provider:   You may see [name], MD or one of the following Advanced Practice Providers on your designated Care Team:   Murray Hodgkins, NP Christell Faith, PA-C Cadence Kathlen Mody, PA-C Gerrie Nordmann, NP    Other Instructions   Important Information About Sugar         Signed, [name], MD  04/30/2022 10:08 AM    [place]

## 2022-05-04 ENCOUNTER — Encounter: Payer: Self-pay | Admitting: *Deleted

## 2022-05-04 NOTE — Addendum Note (Signed)
Addended by: Britt Bottom on: 05/04/2022 07:55 AM   Modules accepted: Orders

## 2022-05-05 ENCOUNTER — Ambulatory Visit: Payer: 59 | Admitting: Gastroenterology

## 2022-05-10 ENCOUNTER — Other Ambulatory Visit: Payer: Self-pay | Admitting: Internal Medicine

## 2022-05-11 ENCOUNTER — Other Ambulatory Visit: Payer: Self-pay

## 2022-05-11 MED ORDER — METOLAZONE 2.5 MG PO TABS
2.5000 mg | ORAL_TABLET | Freq: Every day | ORAL | 4 refills | Status: DC
  Filled 2022-05-11: qty 30, 30d supply, fill #0

## 2022-05-22 ENCOUNTER — Telehealth: Payer: Self-pay | Admitting: Pulmonary Disease

## 2022-05-22 NOTE — Telephone Encounter (Signed)
I called and spoke with Ricardo Brown and she stated that she only needed the Cpap order because she has the sleep study and notes. I have now faxed the order Attn Ricardo Brown

## 2022-05-22 NOTE — Telephone Encounter (Signed)
Ricardo Brown, please advise. Order was placed 01/2022.

## 2022-05-23 IMAGING — DX DG FOOT 2V*L*
2 series · 2 of 2 positions shown · non-contrast
Comparison: 12/20/2021

CLINICAL DATA: Postop

EXAM:
LEFT FOOT - 2 VIEW

[foot ap]
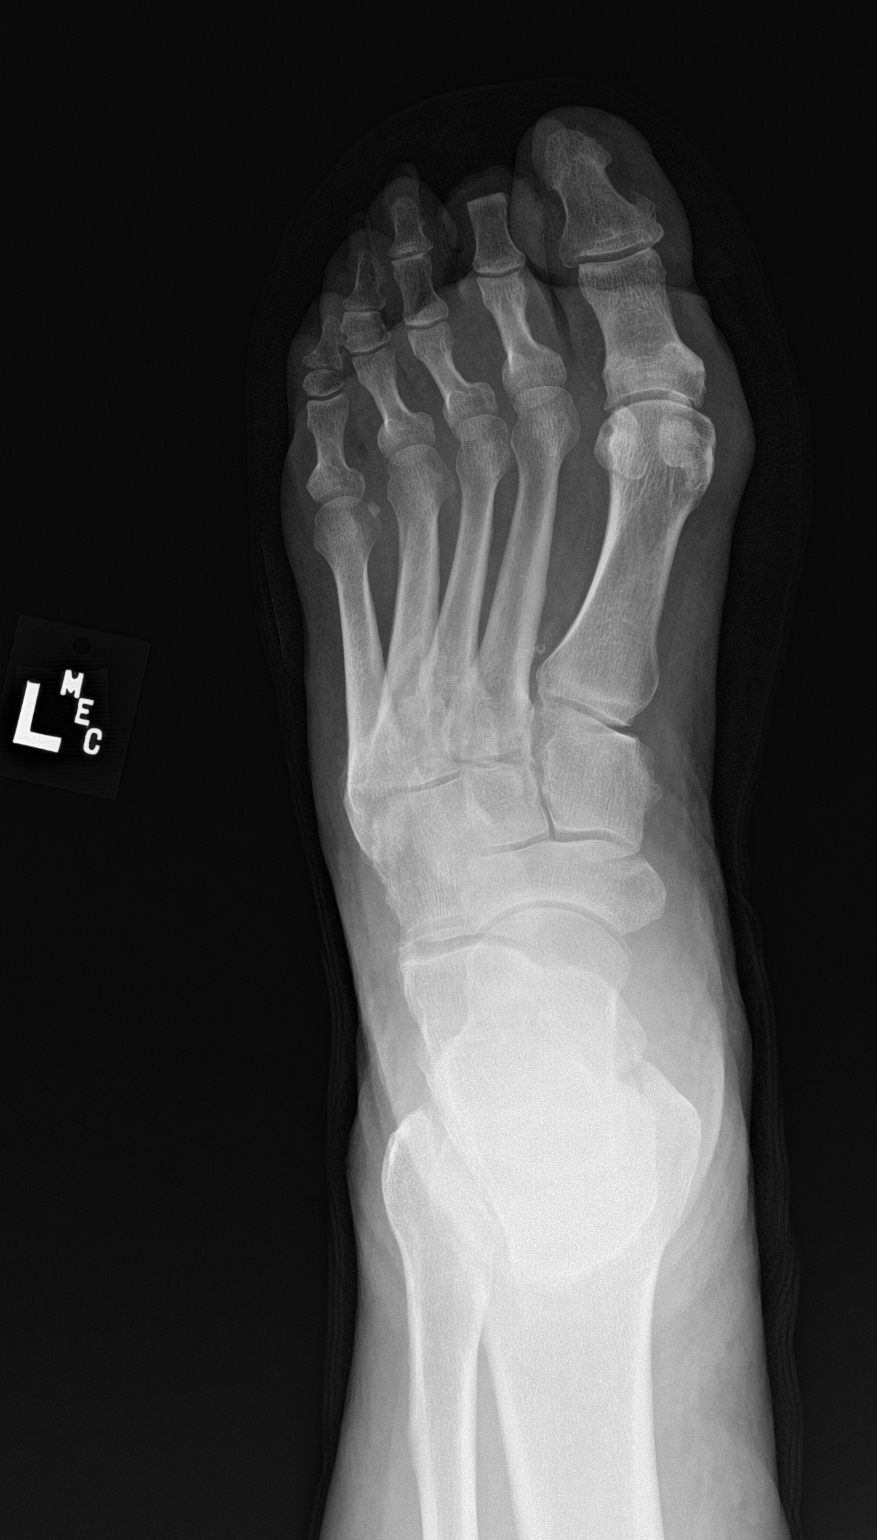

[foot lat]
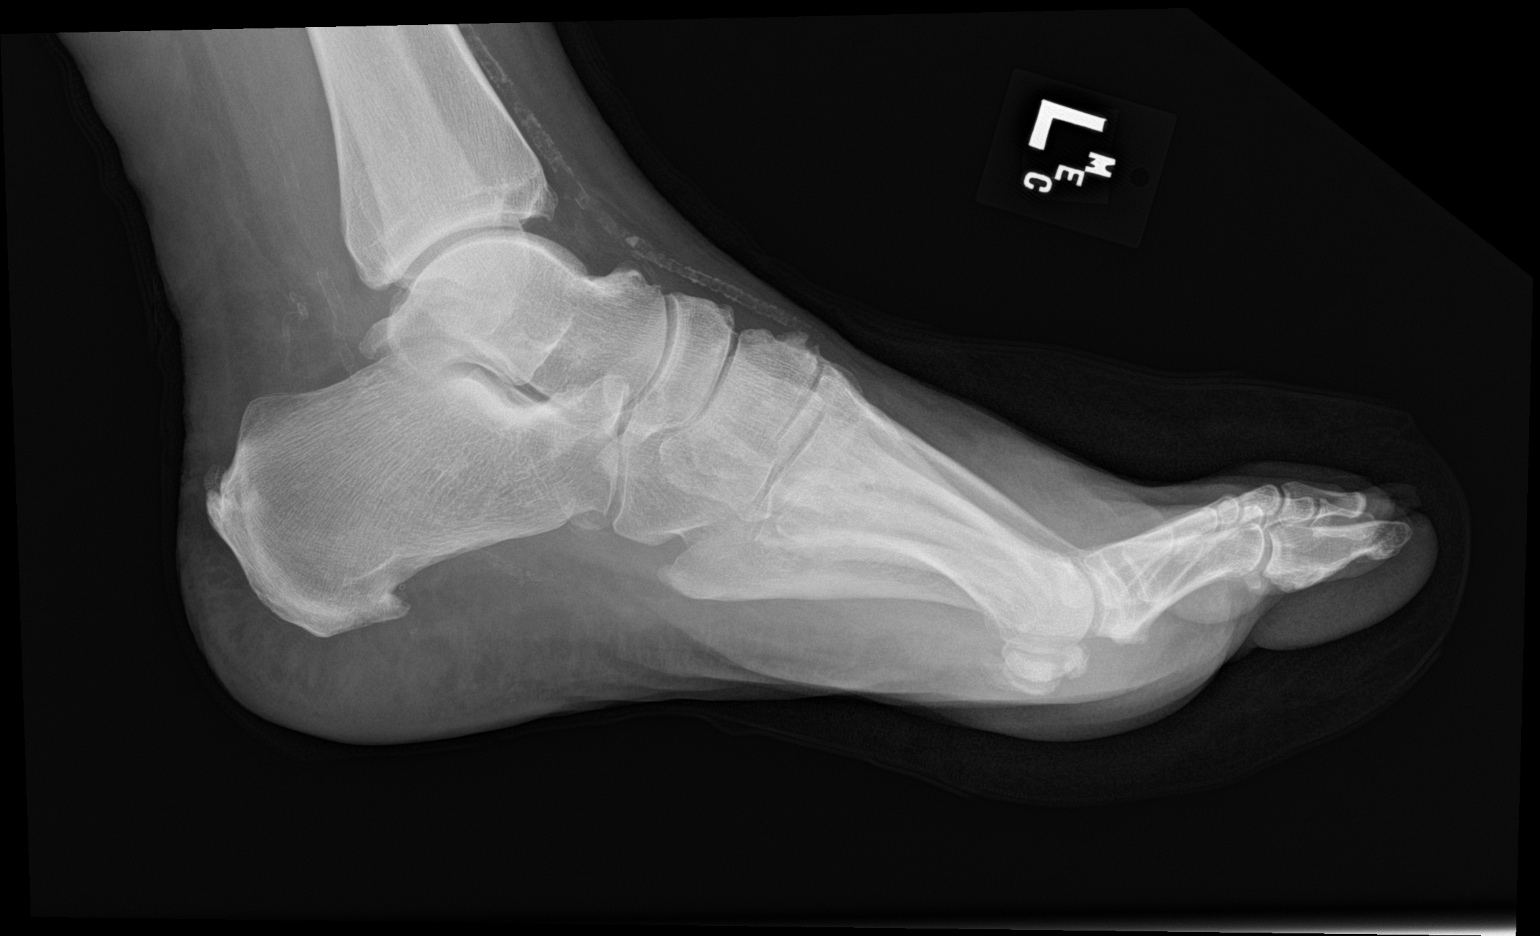

[2 of 2 positions shown; findings below may reference images not displayed]

FINDINGS: Interval partial amputation second digit at the level D IP joint.
Vascular calcifications. Small plantar calcaneal spur
IMPRESSION: Interval amputation of second digit at the DIP joint with expected
surgical changes

## 2022-05-25 ENCOUNTER — Ambulatory Visit
Admission: RE | Admit: 2022-05-25 | Discharge: 2022-05-25 | Disposition: A | Discharge status: Home / Self Care | Payer: Medicare Other | Source: Ambulatory Visit | Attending: Podiatry | Admitting: Podiatry

## 2022-05-25 ENCOUNTER — Other Ambulatory Visit: Payer: Self-pay | Admitting: Podiatry

## 2022-05-25 DIAGNOSIS — E11628 Type 2 diabetes mellitus with other skin complications: Secondary | ICD-10-CM | POA: Diagnosis not present

## 2022-05-25 DIAGNOSIS — L03116 Cellulitis of left lower limb: Secondary | ICD-10-CM

## 2022-05-25 DIAGNOSIS — A419 Sepsis, unspecified organism: Secondary | ICD-10-CM | POA: Diagnosis not present

## 2022-05-25 NOTE — Telephone Encounter (Signed)
I have spoke with Morey Hummingbird and confirmed that she does have this Cpap machine order

## 2022-05-27 ENCOUNTER — Emergency Department: Payer: Medicare Other

## 2022-05-27 ENCOUNTER — Other Ambulatory Visit: Payer: Self-pay

## 2022-05-27 ENCOUNTER — Inpatient Hospital Stay
Admission: EM | Admit: 2022-05-27 | Discharge: 2022-05-30 | DRG: 872 | Disposition: A | Discharge status: Home / Self Care | Payer: Medicare Other | Attending: Internal Medicine | Admitting: Internal Medicine

## 2022-05-27 ENCOUNTER — Encounter: Payer: Self-pay | Admitting: *Deleted

## 2022-05-27 DIAGNOSIS — E872 Acidosis, unspecified: Secondary | ICD-10-CM | POA: Diagnosis present

## 2022-05-27 DIAGNOSIS — Z79899 Other long term (current) drug therapy: Secondary | ICD-10-CM

## 2022-05-27 DIAGNOSIS — E1122 Type 2 diabetes mellitus with diabetic chronic kidney disease: Secondary | ICD-10-CM | POA: Diagnosis present

## 2022-05-27 DIAGNOSIS — E11628 Type 2 diabetes mellitus with other skin complications: Secondary | ICD-10-CM | POA: Diagnosis present

## 2022-05-27 DIAGNOSIS — E7800 Pure hypercholesterolemia, unspecified: Secondary | ICD-10-CM | POA: Diagnosis present

## 2022-05-27 DIAGNOSIS — L03116 Cellulitis of left lower limb: Secondary | ICD-10-CM | POA: Diagnosis present

## 2022-05-27 DIAGNOSIS — D631 Anemia in chronic kidney disease: Secondary | ICD-10-CM | POA: Diagnosis present

## 2022-05-27 DIAGNOSIS — I10 Essential (primary) hypertension: Secondary | ICD-10-CM | POA: Diagnosis present

## 2022-05-27 DIAGNOSIS — E1151 Type 2 diabetes mellitus with diabetic peripheral angiopathy without gangrene: Secondary | ICD-10-CM | POA: Diagnosis present

## 2022-05-27 DIAGNOSIS — E1140 Type 2 diabetes mellitus with diabetic neuropathy, unspecified: Secondary | ICD-10-CM | POA: Diagnosis present

## 2022-05-27 DIAGNOSIS — I2510 Atherosclerotic heart disease of native coronary artery without angina pectoris: Secondary | ICD-10-CM | POA: Diagnosis present

## 2022-05-27 DIAGNOSIS — I5042 Chronic combined systolic (congestive) and diastolic (congestive) heart failure: Secondary | ICD-10-CM | POA: Diagnosis present

## 2022-05-27 DIAGNOSIS — L089 Local infection of the skin and subcutaneous tissue, unspecified: Secondary | ICD-10-CM | POA: Diagnosis not present

## 2022-05-27 DIAGNOSIS — Z1152 Encounter for screening for COVID-19: Secondary | ICD-10-CM | POA: Diagnosis not present

## 2022-05-27 DIAGNOSIS — E669 Obesity, unspecified: Secondary | ICD-10-CM | POA: Diagnosis present

## 2022-05-27 DIAGNOSIS — Z7984 Long term (current) use of oral hypoglycemic drugs: Secondary | ICD-10-CM

## 2022-05-27 DIAGNOSIS — N1832 Chronic kidney disease, stage 3b: Secondary | ICD-10-CM | POA: Diagnosis present

## 2022-05-27 DIAGNOSIS — N183 Chronic kidney disease, stage 3 unspecified: Secondary | ICD-10-CM | POA: Diagnosis present

## 2022-05-27 DIAGNOSIS — I4891 Unspecified atrial fibrillation: Secondary | ICD-10-CM | POA: Diagnosis present

## 2022-05-27 DIAGNOSIS — Z794 Long term (current) use of insulin: Secondary | ICD-10-CM

## 2022-05-27 DIAGNOSIS — Z951 Presence of aortocoronary bypass graft: Secondary | ICD-10-CM

## 2022-05-27 DIAGNOSIS — N179 Acute kidney failure, unspecified: Secondary | ICD-10-CM | POA: Diagnosis present

## 2022-05-27 DIAGNOSIS — Z888 Allergy status to other drugs, medicaments and biological substances status: Secondary | ICD-10-CM

## 2022-05-27 DIAGNOSIS — I340 Nonrheumatic mitral (valve) insufficiency: Secondary | ICD-10-CM | POA: Diagnosis present

## 2022-05-27 DIAGNOSIS — E871 Hypo-osmolality and hyponatremia: Secondary | ICD-10-CM | POA: Diagnosis present

## 2022-05-27 DIAGNOSIS — R6520 Severe sepsis without septic shock: Secondary | ICD-10-CM | POA: Diagnosis present

## 2022-05-27 DIAGNOSIS — I130 Hypertensive heart and chronic kidney disease with heart failure and stage 1 through stage 4 chronic kidney disease, or unspecified chronic kidney disease: Secondary | ICD-10-CM | POA: Diagnosis present

## 2022-05-27 DIAGNOSIS — D649 Anemia, unspecified: Secondary | ICD-10-CM | POA: Diagnosis present

## 2022-05-27 DIAGNOSIS — Z6832 Body mass index (BMI) 32.0-32.9, adult: Secondary | ICD-10-CM

## 2022-05-27 DIAGNOSIS — I252 Old myocardial infarction: Secondary | ICD-10-CM

## 2022-05-27 DIAGNOSIS — A419 Sepsis, unspecified organism: Secondary | ICD-10-CM | POA: Diagnosis present

## 2022-05-27 DIAGNOSIS — Z792 Long term (current) use of antibiotics: Secondary | ICD-10-CM

## 2022-05-27 DIAGNOSIS — I739 Peripheral vascular disease, unspecified: Secondary | ICD-10-CM | POA: Diagnosis present

## 2022-05-27 DIAGNOSIS — I7025 Atherosclerosis of native arteries of other extremities with ulceration: Secondary | ICD-10-CM | POA: Diagnosis present

## 2022-05-27 DIAGNOSIS — Z7982 Long term (current) use of aspirin: Secondary | ICD-10-CM

## 2022-05-27 LAB — CBC WITH DIFFERENTIAL/PLATELET
Abs Immature Granulocytes: 0.08 10*3/uL — ABNORMAL HIGH (ref 0.00–0.07)
Basophils Absolute: 0 10*3/uL (ref 0.0–0.1)
Basophils Relative: 0 %
Eosinophils Absolute: 0 10*3/uL (ref 0.0–0.5)
Eosinophils Relative: 0 %
HCT: 33.6 % — ABNORMAL LOW (ref 39.0–52.0)
Hemoglobin: 11.1 g/dL — ABNORMAL LOW (ref 13.0–17.0)
Immature Granulocytes: 1 %
Lymphocytes Relative: 7 %
Lymphs Abs: 1.1 10*3/uL (ref 0.7–4.0)
MCH: 29.4 pg (ref 26.0–34.0)
MCHC: 33 g/dL (ref 30.0–36.0)
MCV: 89.1 fL (ref 80.0–100.0)
Monocytes Absolute: 1 10*3/uL (ref 0.1–1.0)
Monocytes Relative: 7 %
Neutro Abs: 13.7 10*3/uL — ABNORMAL HIGH (ref 1.7–7.7)
Neutrophils Relative %: 85 %
Platelets: 221 10*3/uL (ref 150–400)
RBC: 3.77 MIL/uL — ABNORMAL LOW (ref 4.22–5.81)
RDW: 13.4 % (ref 11.5–15.5)
WBC: 15.9 10*3/uL — ABNORMAL HIGH (ref 4.0–10.5)
nRBC: 0 % (ref 0.0–0.2)

## 2022-05-27 LAB — COMPREHENSIVE METABOLIC PANEL
ALT: 17 U/L (ref 0–44)
AST: 23 U/L (ref 15–41)
Albumin: 4 g/dL (ref 3.5–5.0)
Alkaline Phosphatase: 48 U/L (ref 38–126)
Anion gap: 14 (ref 5–15)
BUN: 58 mg/dL — ABNORMAL HIGH (ref 6–20)
CO2: 23 mmol/L (ref 22–32)
Calcium: 9 mg/dL (ref 8.9–10.3)
Chloride: 95 mmol/L — ABNORMAL LOW (ref 98–111)
Creatinine, Ser: 2.77 mg/dL — ABNORMAL HIGH (ref 0.61–1.24)
GFR, Estimated: 25 mL/min — ABNORMAL LOW (ref >60)
Glucose, Bld: 216 mg/dL — ABNORMAL HIGH (ref 70–99)
Potassium: 4.2 mmol/L (ref 3.5–5.1)
Sodium: 132 mmol/L — ABNORMAL LOW (ref 135–145)
Total Bilirubin: 1 mg/dL (ref 0.3–1.2)
Total Protein: 8 g/dL (ref 6.5–8.1)

## 2022-05-27 LAB — LACTIC ACID, PLASMA
Lactic Acid, Venous: 2.3 mmol/L (ref 0.5–1.9)
Lactic Acid, Venous: 1.7 mmol/L (ref 0.5–1.9)

## 2022-05-27 LAB — PROTIME-INR
INR: 1.2 (ref 0.8–1.2)
Prothrombin Time: 14.7 seconds (ref 11.4–15.2)

## 2022-05-27 LAB — MAGNESIUM: Magnesium: 2.2 mg/dL (ref 1.7–2.4)

## 2022-05-27 LAB — TSH: TSH: 3.03 u[IU]/mL (ref 0.350–4.500)

## 2022-05-27 LAB — PHOSPHORUS: Phosphorus: 3.9 mg/dL (ref 2.5–4.6)

## 2022-05-27 MED ORDER — METRONIDAZOLE 500 MG/100ML IV SOLN
500.0000 mg | Freq: Once | INTRAVENOUS | Status: DC
  Filled 2022-05-27: qty 100

## 2022-05-27 MED ORDER — HYDRALAZINE HCL 20 MG/ML IJ SOLN: 10.0000 mg | Freq: Four times a day (QID) | INTRAMUSCULAR | Status: DC | PRN

## 2022-05-27 MED ORDER — HEPARIN SODIUM (PORCINE) 5000 UNIT/ML IJ SOLN
5000.0000 [IU] | Freq: Three times a day (TID) | INTRAMUSCULAR | Status: DC
  Administered 2022-05-27 – 2022-05-30 (×7): 5000 [IU] via SUBCUTANEOUS
  Filled 2022-05-27 (×7): qty 1

## 2022-05-27 MED ORDER — VANCOMYCIN HCL IN DEXTROSE 1-5 GM/200ML-% IV SOLN
1000.0000 mg | Freq: Once | INTRAVENOUS | Status: DC
  Filled 2022-05-27: qty 200

## 2022-05-27 MED ORDER — GABAPENTIN 100 MG PO CAPS
100.0000 mg | ORAL_CAPSULE | Freq: Two times a day (BID) | ORAL | Status: DC
  Administered 2022-05-27 – 2022-05-30 (×6): 100 mg via ORAL
  Filled 2022-05-27 (×6): qty 1

## 2022-05-27 MED ORDER — SODIUM CHLORIDE 0.9 % IV SOLN: INTRAVENOUS | Status: AC

## 2022-05-27 MED ORDER — LACTATED RINGERS IV BOLUS
1000.0000 mL | Freq: Once | INTRAVENOUS | Status: AC
  Administered 2022-05-27: 1000 mL via INTRAVENOUS

## 2022-05-27 MED ORDER — SODIUM CHLORIDE 0.9% FLUSH
3.0000 mL | Freq: Two times a day (BID) | INTRAVENOUS | Status: DC
  Administered 2022-05-28 – 2022-05-30 (×5): 3 mL via INTRAVENOUS

## 2022-05-27 MED ORDER — AMIODARONE HCL 200 MG PO TABS
200.0000 mg | ORAL_TABLET | Freq: Every day | ORAL | Status: DC
  Administered 2022-05-28 – 2022-05-30 (×3): 200 mg via ORAL
  Filled 2022-05-27 (×3): qty 1

## 2022-05-27 MED ORDER — MORPHINE SULFATE (PF) 2 MG/ML IV SOLN
2.0000 mg | INTRAVENOUS | Status: DC | PRN
  Administered 2022-05-27 – 2022-05-28 (×3): 2 mg via INTRAVENOUS
  Filled 2022-05-27 (×3): qty 1

## 2022-05-27 MED ORDER — VANCOMYCIN HCL 2000 MG/400ML IV SOLN
2000.0000 mg | Freq: Once | INTRAVENOUS | Status: AC
  Administered 2022-05-27: 2000 mg via INTRAVENOUS
  Filled 2022-05-27: qty 400

## 2022-05-27 MED ORDER — METOPROLOL SUCCINATE ER 50 MG PO TB24
50.0000 mg | ORAL_TABLET | Freq: Every day | ORAL | Status: DC
  Administered 2022-05-28 – 2022-05-30 (×3): 50 mg via ORAL
  Filled 2022-05-27 (×3): qty 1

## 2022-05-27 MED ORDER — METRONIDAZOLE 500 MG/100ML IV SOLN
500.0000 mg | Freq: Two times a day (BID) | INTRAVENOUS | Status: DC
  Administered 2022-05-27 – 2022-05-30 (×6): 500 mg via INTRAVENOUS
  Filled 2022-05-27 (×5): qty 100

## 2022-05-27 MED ORDER — ASPIRIN 81 MG PO TBEC
81.0000 mg | DELAYED_RELEASE_TABLET | Freq: Every day | ORAL | Status: DC
  Administered 2022-05-28 – 2022-05-30 (×3): 81 mg via ORAL
  Filled 2022-05-27 (×3): qty 1

## 2022-05-27 MED ORDER — SODIUM CHLORIDE 0.9 % IV SOLN
2.0000 g | Freq: Once | INTRAVENOUS | Status: AC
  Administered 2022-05-27: 2 g via INTRAVENOUS
  Filled 2022-05-27: qty 12.5

## 2022-05-27 MED ORDER — ROSUVASTATIN CALCIUM 10 MG PO TABS
20.0000 mg | ORAL_TABLET | Freq: Every day | ORAL | Status: DC
  Administered 2022-05-28 – 2022-05-30 (×3): 20 mg via ORAL
  Filled 2022-05-27 (×3): qty 2

## 2022-05-27 MED ORDER — OMEGA-3-ACID ETHYL ESTERS 1 G PO CAPS
1000.0000 mg | ORAL_CAPSULE | Freq: Two times a day (BID) | ORAL | Status: DC
  Administered 2022-05-28 – 2022-05-30 (×5): 1000 mg via ORAL
  Filled 2022-05-27 (×5): qty 1

## 2022-05-27 MED ORDER — SODIUM CHLORIDE 0.9 % IV SOLN
2.0000 g | Freq: Two times a day (BID) | INTRAVENOUS | Status: DC
  Administered 2022-05-28 – 2022-05-30 (×5): 2 g via INTRAVENOUS
  Filled 2022-05-27: qty 12.5
  Filled 2022-05-27 (×3): qty 2
  Filled 2022-05-27 (×3): qty 12.5

## 2022-05-27 MED ORDER — PRAMIPEXOLE DIHYDROCHLORIDE 0.25 MG PO TABS
1.5000 mg | ORAL_TABLET | Freq: Two times a day (BID) | ORAL | Status: DC
  Administered 2022-05-28 – 2022-05-29 (×4): 1.5 mg via ORAL
  Filled 2022-05-27 (×5): qty 6

## 2022-05-27 NOTE — Assessment & Plan Note (Signed)
Patient meets sepsis criteria. We will continue on maintenance fluids and broad-spectrum IV antibiotic therapy.

## 2022-05-27 NOTE — Assessment & Plan Note (Signed)
Stable patient denies any chest discomfort pressure or shortness of breath. We will continue patient on his home regimen of aspirin, metoprolol, statin therapy.

## 2022-05-27 NOTE — Assessment & Plan Note (Signed)
Vitals:   05/27/22 2006 05/27/22 2213  BP: (!) 117/59 (!) 111/59   We will continue patient on metoprolol. We will continue patient on as needed hydralazine as needed for hypertension. We will currently hold patient's metolazone, losartan, torsemide.

## 2022-05-27 NOTE — Assessment & Plan Note (Addendum)
Initially compensated or even dry due to sepsis.  We will check BNP and follow input and output.  BNP in May notes ejection fraction of 45 to 31% grade 2 diastolic dysfunction.  Mildly elevated BNP of 400.  Blood pressure still slightly soft.  Will give 1 dose of Lasix prior to discharge

## 2022-05-27 NOTE — H&P (Addendum)
History and Physical    Chief Complaint: Left leg pain   HISTORY OF PRESENT ILLNESS: Ricardo Brown is an 61 y.o. male presenting with left leg pain since the past few days. Patient is not able to bear weight and walk. He has neuropathy but this degree of pain and immobility in need for him. Patient has had an MRI scheduled by PCP and was started on Cipro and Bactrim for cellulitis. Post antibiotic regimen patient still has redness swelling and difficulty ambulating. Erythema has extended up her leg. CMP today shows mild hyponatremia with a g sodium of 132. Kidney function is 2.7.  Leukocytosis with a white count and anemia receives chronic. Pt states he started to have pain on the 1st MTP on Saturday and his leg got progressively red , swollen / painful since Tuesday.  He went to podiatry and had I/D and has not gotten better with antibiotics.   Pt has PMH as below: Past Medical History:  Diagnosis Date   Chronic combined systolic (congestive) and diastolic (congestive) heart failure ([place])    a. 01/2018 EF 35-40%; b. 10/2020 Echo: EF 40-45%, glob HK. RVSP 42.8mHg. Mildly dil LA. Mild MS/AoV sclerosis.   CKD (chronic kidney disease), stage Brown (HCC)    Coronary artery disease    a. 01/2018 late presenting inferior MI; b. 01/2018 Cath: Severe multivessel dzs-->CABG x 3 (LIMA->LAD, VG->OM2, VG->RPDA @ [facility]).   Diabetes mellitus without complication ([place]    a. Dx ~ 2000.   Heart palpitations    a. Pt reports prior nl echo's and stress tests. Last stress test w/in past 2 yrs - PCP.   High cholesterol    Hypertension    Mitral regurgitation    a. 01/2018 s/p MV repair @ time of CABG.   Myocardial infarction ([facility]    Postoperative atrial fibrillation    a. 01/2018 @ time of CABG.   PSVT (paroxysmal supraventricular tachycardia)    a. Very symptomatic with multiple ED evaluations.  Has been on amio.   Severe sepsis with acute organ dysfunction ([place] 12/19/2021   Sleep  difficulties 09/07/2019   STEMI (ST elevation myocardial infarction) ([place] 01/24/2018     Review of Systems  Constitutional: Negative.   HENT: Negative.    Musculoskeletal:  Positive for gait problem, joint swelling and myalgias.    Allergies  Allergen Reactions   Lipitor [Atorvastatin] Palpitations    SVT   Sacubitril-Valsartan Other (See Comments)    SVT Other reaction(s): Other (see comments) Other reaction(s): Other (See Comments) SVT SVT   Pregabalin Palpitations    SVT Other reaction(s): Other (see comments) SVT SVT    Past Surgical History:  Procedure Laterality Date   AMPUTATION TOE Right 08/29/2021   Procedure: AMPUTATION TOE-2nd toe;  Surgeon: CSharlotte Alamo DPM;  Location: [facility] ORS;  Service: Podiatry;  Laterality: Right;   AMPUTATION TOE Left 12/22/2021   Procedure: AMPUTATION TOE-2nd Toe Partial;  Surgeon: BCaroline More DPM;  Location: [facility] ORS;  Service: Podiatry;  Laterality: Left;   CARDIAC CATHETERIZATION     CARDIAC VALVE REPLACEMENT     Mitral Valve Repair   COLONOSCOPY WITH PROPOFOL N/A 06/07/2019   Procedure: COLONOSCOPY WITH PROPOFOL;  Surgeon: TVirgel Manifold MD;  Location: [facility] ENDOSCOPY;  Service: Endoscopy;  Laterality: N/A;   COLONOSCOPY WITH PROPOFOL N/A 02/07/2020   Procedure: COLONOSCOPY WITH PROPOFOL;  Surgeon: TVirgel Manifold MD;  Location: [facility] ENDOSCOPY;  Service: Endoscopy;  Laterality: N/A;   COLONOSCOPY WITH PROPOFOL N/A 12/11/2020  Procedure: COLONOSCOPY WITH PROPOFOL;  Surgeon: [name], MD;  Location: [facility] ENDOSCOPY;  Service: Endoscopy;  Laterality: N/A;   CORONARY ANGIOPLASTY     CORONARY ARTERY BYPASS GRAFT     x 3   01/2018   ESOPHAGOGASTRODUODENOSCOPY N/A 02/07/2020   Procedure: ESOPHAGOGASTRODUODENOSCOPY (EGD);  Surgeon: [name], MD;  Location: [facility] ENDOSCOPY;  Service: Endoscopy;  Laterality: N/A;   LEFT HEART CATH AND CORONARY ANGIOGRAPHY N/A 01/24/2018   Procedure: LEFT HEART CATH AND CORONARY  ANGIOGRAPHY;  Surgeon: [name], MD;  Location: [place] [facility];  Service: Cardiovascular;  Laterality: N/A;   SVT ABLATION N/A 09/12/2021   Procedure: SVT ABLATION;  Surgeon: Vickie Epley, MD;  Location: [place] CV LAB;  Service: Cardiovascular;  Laterality: N/A;   TONSILLECTOMY        Social History   Socioeconomic History   Marital status: Married    Spouse name: Not on file   Number of children: Not on file   Years of education: Not on file   Highest education level: Not on file  Occupational History   Not on file  Tobacco Use   Smoking status: Never   Smokeless tobacco: Never  Vaping Use   Vaping Use: Never used  Substance and Sexual Activity   Alcohol use: Not Currently    Comment: rare beer   Drug use: No   Sexual activity: Not on file  Other Topics Concern   Not on file  Social History Narrative   Lives locally with wife.  Works in [employer].  Does not routinely exercise.   Social Determinants of Health   Financial Resource Strain: Not on file  Food Insecurity: Not on file  Transportation Needs: Not on file  Physical Activity: Not on file  Stress: Not on file  Social Connections: Not on file      CURRENT MEDS:   Current Outpatient Medications (Endocrine & Metabolic):    Dulaglutide 1.5 MG/0.5ML SOPN, Inject 1.5 mg into the skin every Saturday.   empagliflozin (JARDIANCE) 25 MG TABS tablet, Take 25 mg by mouth daily.   insulin degludec (TRESIBA) 200 UNIT/ML FlexTouch Pen, 32 Units at bedtime.   metFORMIN (GLUCOPHAGE) 500 MG tablet, Take 2 tablets (1,000 mg total) by mouth 2 (two) times daily.  Current Facility-Administered Medications (Cardiovascular):    amiodarone (PACERONE) tablet 200 mg   hydrALAZINE (APRESOLINE) injection 10 mg   metoprolol succinate (TOPROL-XL) 24 hr tablet 50 mg   rosuvastatin (CRESTOR) tablet 20 mg  Current Outpatient Medications (Cardiovascular):    amiodarone (PACERONE) 200 MG tablet, Take 200 mg by  mouth daily.   losartan (COZAAR) 25 MG tablet, Take 25 mg by mouth daily.   metolazone (ZAROXOLYN) 2.5 MG tablet, Take 1 tablet (2.5 mg total) by mouth daily.   metoprolol succinate (TOPROL-XL) 50 MG 24 hr tablet, Take 1 tablet (50 mg total) by mouth daily. Take with or immediately following a meal.   rosuvastatin (CRESTOR) 20 MG tablet, Take 1 tablet (20 mg total) by mouth daily.   torsemide (DEMADEX) 20 MG tablet, Take 2 tablets (40 mg total) by mouth daily. 40 mg daily    Current Facility-Administered Medications (Analgesics):    aspirin EC tablet 81 mg   morphine (PF) 2 MG/ML injection 2 mg  Current Outpatient Medications (Analgesics):    aspirin 81 MG EC tablet, Take 81 mg by mouth daily.   Current Facility-Administered Medications (Hematological):    heparin injection 5,000 Units   Current  Facility-Administered Medications (Other):    0.9 %  sodium chloride infusion   Fish Oil CAPS 875 mg   gabapentin (NEURONTIN) capsule 100 mg   metroNIDAZOLE (FLAGYL) IVPB 500 mg   pramipexole (MIRAPEX) tablet 1.5 mg   sodium chloride flush (NS) 0.9 % injection 3 mL   vancomycin (VANCOREADY) IVPB 2000 mg/400 mL  Current Outpatient Medications (Other):    b complex vitamins tablet, Take 1 tablet by mouth daily.    ciprofloxacin (CIPRO) 500 MG tablet, Take 500 mg by mouth 2 (two) times daily.   gabapentin (NEURONTIN) 100 MG capsule, Take 1 capsule (100 mg total) by mouth 2 (two) times daily.   Omega-3 Fatty Acids (FISH OIL) 875 MG CAPS, Take 875 mg by mouth 2 (two) times daily.   pramipexole (MIRAPEX) 1.5 MG tablet, Take 1 tablet (1.5 mg total) by mouth 2 (two) times daily.   sulfamethoxazole-trimethoprim (BACTRIM DS) 800-160 MG tablet, Take 1 tablet by mouth 2 (two) times daily.    ED Course: Pt in Ed pt is alert/awake/ oriented and low grade temp of 99.1 Vitals:   05/27/22 2006 05/27/22 2007 05/27/22 2010 05/27/22 2213  BP: (!) 117/59   (!) 111/59  Pulse: 94   83  Resp: 20   16   Temp: 99.1 F (37.3 C)   (!) 101.2 F (38.4 C)  TempSrc: Oral   Oral  SpO2: 100%   99%  Weight:  94.8 kg 94.8 kg   Height:  '5\' 8"'$  (1.727 m) '5\' 8"'$  (1.727 m)    No intake/output data recorded. SpO2: 99 % Blood work in ed shows electrolyte abnormalities kidney injury on CKD. Elevated white count and anemia with hemoglobin of 11.1. Results for orders placed or performed during the hospital encounter of 05/27/22 (from the past 48 hour(s))  Comprehensive metabolic panel     Status: Abnormal   Collection Time: 05/27/22  8:13 PM  Result Value Ref Range   Sodium 132 (L) 135 - 145 mmol/L   Potassium 4.2 3.5 - 5.1 mmol/L   Chloride 95 (L) 98 - 111 mmol/L   CO2 23 22 - 32 mmol/L   Glucose, Bld 216 (H) 70 - 99 mg/dL    Comment: Glucose reference range applies only to samples taken after fasting for at least 8 hours.   BUN 58 (H) 6 - 20 mg/dL   Creatinine, Ser 2.77 (H) 0.61 - 1.24 mg/dL   Calcium 9.0 8.9 - 10.3 mg/dL   Total Protein 8.0 6.5 - 8.1 g/dL   Albumin 4.0 3.5 - 5.0 g/dL   AST 23 15 - 41 U/L   ALT 17 0 - 44 U/L   Alkaline Phosphatase 48 38 - 126 U/L   Total Bilirubin 1.0 0.3 - 1.2 mg/dL   GFR, Estimated 25 (L) >60 mL/min    Comment: (NOTE) Calculated using the CKD-EPI Creatinine Equation (2021)    Anion gap 14 5 - 15    Comment: Performed at [facility], [address] [place], [place] [address]  CBC with Differential     Status: Abnormal   Collection Time: 05/27/22  8:13 PM  Result Value Ref Range   WBC 15.9 (H) 4.0 - 10.5 K/uL   RBC 3.77 (L) 4.22 - 5.81 MIL/uL   Hemoglobin 11.1 (L) 13.0 - 17.0 g/dL   HCT 33.6 (L) 39.0 - 52.0 %   MCV 89.1 80.0 - 100.0 fL   MCH 29.4 26.0 - 34.0 pg   MCHC 33.0 30.0 - 36.0  g/dL   RDW 13.4 11.5 - 15.5 %   Platelets 221 150 - 400 K/uL   nRBC 0.0 0.0 - 0.2 %   Neutrophils Relative % 85 %   Neutro Abs 13.7 (H) 1.7 - 7.7 K/uL   Lymphocytes Relative 7 %   Lymphs Abs 1.1 0.7 - 4.0 K/uL   Monocytes Relative 7 %   Monocytes  Absolute 1.0 0.1 - 1.0 K/uL   Eosinophils Relative 0 %   Eosinophils Absolute 0.0 0.0 - 0.5 K/uL   Basophils Relative 0 %   Basophils Absolute 0.0 0.0 - 0.1 K/uL   Immature Granulocytes 1 %   Abs Immature Granulocytes 0.08 (H) 0.00 - 0.07 K/uL    Comment: Performed at [facility], [address] [place], [place] [address]  Protime-INR     Status: None   Collection Time: 05/27/22  8:13 PM  Result Value Ref Range   Prothrombin Time 14.7 11.4 - 15.2 seconds   INR 1.2 0.8 - 1.2    Comment: (NOTE) INR goal varies based on device and disease states. Performed at [facility], [address] [place], [place] [address]   Lactic acid, plasma     Status: Abnormal   Collection Time: 05/27/22  8:14 PM  Result Value Ref Range   Lactic Acid, Venous 2.3 (HH) 0.5 - 1.9 mmol/L    Comment: CRITICAL RESULT CALLED TO, READ BACK BY AND VERIFIED WITH KEYRI MARTINEZ '@2046'$  ON 05/27/22 SKL Performed at [facility], [address] [place], [place] [address]   Lactic acid, plasma     Status: None   Collection Time: 05/27/22  9:50 PM  Result Value Ref Range   Lactic Acid, Venous 1.7 0.5 - 1.9 mmol/L    Comment: Performed at [facility], [address] [place], [place] [address]    In Ed pt received LR, cefepime, vancomycin, metronidazole. Meds ordered this encounter  Medications   lactated ringers bolus 1,000 mL   ceFEPIme (MAXIPIME) 2 g in sodium chloride 0.9 % 100 mL IVPB    Order Specific Question:   Antibiotic Indication:    Answer:   Sepsis   DISCONTD: vancomycin (VANCOCIN) IVPB 1000 mg/200 mL premix    Order Specific Question:   Indication:    Answer:   Cellulitis   DISCONTD: metroNIDAZOLE (FLAGYL) IVPB 500 mg    Order Specific Question:   Antibiotic Indication:    Answer:   Other Indication (list below)    Order Specific Question:   Other Indication:    Answer:   Diabetic foot infection   metroNIDAZOLE (FLAGYL) IVPB 500 mg    Order Specific  Question:   Antibiotic Indication:    Answer:   Other Indication (list below)    Order Specific Question:   Other Indication:    Answer:   DF infection.   amiodarone (PACERONE) tablet 200 mg   gabapentin (NEURONTIN) capsule 100 mg   rosuvastatin (CRESTOR) tablet 20 mg   aspirin EC tablet 81 mg   metoprolol succinate (TOPROL-XL) 24 hr tablet 50 mg    Take with or immediately following a meal.     Fish Oil CAPS 875 mg   pramipexole (MIRAPEX) tablet 1.5 mg   sodium chloride flush (NS) 0.9 % injection 3 mL   0.9 %  sodium chloride infusion   morphine (PF) 2 MG/ML injection 2 mg   hydrALAZINE (APRESOLINE) injection 10 mg   heparin injection 5,000 Units   vancomycin (VANCOREADY) IVPB  2000 mg/400 mL    Order Specific Question:   Indication:    Answer:   Sepsis    Unresulted Labs (From admission, onward)     Start     Ordered   05/27/22 2013  Lactic acid, plasma  Now then every 2 hours,   STAT      05/27/22 2013   05/27/22 2013  Culture, blood (Routine x 2)  BLOOD CULTURE X 2,   STAT      05/27/22 2013   05/27/22 2013  Urinalysis, Routine w reflex microscopic  Once,   URGENT        05/27/22 2013             Admission Imaging : DG Chest 2 View  Result Date: 05/27/2022 CLINICAL DATA:  Foot swelling and fever. EXAM: CHEST - 2 VIEW COMPARISON:  12/19/2021 FINDINGS: Postoperative changes in the mediastinum. Heart size and pulmonary vascularity are normal. Lungs are clear. No pleural effusions. No pneumothorax. Mediastinal contours appear intact. IMPRESSION: No active cardiopulmonary disease. Electronically Signed   By: Lucienne Capers M.D.   On: 05/27/2022 20:57      Physical Examination: Vitals:   05/27/22 2006 05/27/22 2007 05/27/22 2010 05/27/22 2213  BP: (!) 117/59   (!) 111/59  Pulse: 94   83  Temp: 99.1 F (37.3 C)   (!) 101.2 F (38.4 C)  Resp: 20   16  Height:  '5\' 8"'$  (1.727 m) '5\' 8"'$  (1.727 m)   Weight:  94.8 kg 94.8 kg   SpO2: 100%   99%  TempSrc: Oral   Oral   BMI (Calculated):  31.79 31.79    Physical Exam Vitals and nursing note reviewed.  Constitutional:      General: He is not in acute distress.    Appearance: Normal appearance. He is not ill-appearing, toxic-appearing or diaphoretic.  HENT:     Head: Normocephalic and atraumatic.     Right Ear: Hearing and external ear normal.     Left Ear: Hearing and external ear normal.     Nose: Nose normal. No nasal deformity.     Mouth/Throat:     Lips: Pink.     Mouth: Mucous membranes are moist.     Tongue: No lesions.     Pharynx: Oropharynx is clear.  Eyes:     Extraocular Movements: Extraocular movements intact.  Neck:     Vascular: No carotid bruit.  Cardiovascular:     Rate and Rhythm: Normal rate and regular rhythm.     Pulses: Normal pulses.     Heart sounds: Normal heart sounds.  Pulmonary:     Effort: Pulmonary effort is normal.     Breath sounds: Normal breath sounds.  Abdominal:     General: Bowel sounds are normal. There is no distension.     Palpations: Abdomen is soft. There is no mass.     Tenderness: There is no abdominal tenderness. There is no guarding.     Hernia: No hernia is present.  Musculoskeletal:        General: Swelling and tenderness present.     Right lower leg: No edema.     Left lower leg: Swelling, deformity and tenderness present. No lacerations. Edema present.       Legs:  Skin:    General: Skin is warm.  Neurological:     General: No focal deficit present.     Mental Status: He is alert and oriented to person, place, and time.  Cranial Nerves: Cranial nerves 2-12 are intact.     Motor: Motor function is intact.  Psychiatric:        Attention and Perception: Attention normal.        Mood and Affect: Mood normal.        Speech: Speech normal.        Behavior: Behavior normal. Behavior is cooperative.        Cognition and Memory: Cognition normal.        Assessment and Plan: * Diabetic foot infection ([place]) Patient has failed  outpatient therapy with ciprofloxacin. We will continue patient on broad-spectrum IV antibiotic therapy to cover MRSA, gram-positive, gram-negative,  Anaerobic and atypical pharmacy protocol based vancomycin, cefepime and continue metronidazole. Optimize blood sugar control. Sliding scale insulin coverage while in the hospital we will hold patient's metformin and Tresiba. We will also hold patient's Jardiance to prevent hypotension related worsening of kidney function. MRI of the foot shows diffuse soft tissue swelling consistent with cellulitis.  Anemia Anemia of chronic disease suspect secondary to anemia of chronic kidney disease.    Latest Ref Rng & Units 05/27/2022    8:13 PM 12/20/2021    5:48 AM 12/19/2021    5:27 PM  CBC  WBC 4.0 - 10.5 K/uL 15.9  19.6  25.9   Hemoglobin 13.0 - 17.0 g/dL 11.1  10.4  12.8   Hematocrit 39.0 - 52.0 % 33.6  31.7  38.6   Platelets 150 - 400 K/uL 221  139  205   we will monitor h/h and wbc and platelet count.    Sepsis [facility]) Patient meets sepsis criteria. We will continue on maintenance fluids and broad-spectrum IV antibiotic therapy.   Acute renal failure superimposed on stage 3 chronic kidney disease ([place]) Lab Results  Component Value Date   CREATININE 2.77 (H) 05/27/2022   CREATININE 1.85 (H) 04/08/2022   CREATININE 1.80 (H) 12/20/2021  We will avoid contrast studies and renally dose all needed medications. We will hold renally cleared medications and's this case metformin, losartan. We will hold diuretics and her's case metolazone torsemide. Gentle IV fluid hydration.  PAD (peripheral artery disease) (HCC) pulses intact.  Pt continue on hydralazine and metoprolol.   Chronic combined systolic (congestive) and diastolic (congestive) heart failure (HCC) Compensated even dehydrated. We will continue patient on aspirin metoprolol as needed hydralazine. We will continue patient on her feet. We will hold patient's metolazone and  torsemide.   Coronary artery disease Stable patient denies any chest discomfort pressure or shortness of breath. We will continue patient on his home regimen of aspirin, metoprolol, statin therapy.   Hypertension Vitals:   05/27/22 2006 05/27/22 2213  BP: (!) 117/59 (!) 111/59   We will continue patient on metoprolol. We will continue patient on as needed hydralazine as needed for hypertension. We will currently hold patient's metolazone, losartan, torsemide.  Type 2 diabetes mellitus with diabetic chronic kidney disease ([place]) We will start pt on glycemic protocol. accuchecks q6h.        DVT prophylaxis:  Heparin  Code Status:  Full code  Family Communication:  Abid,Jennifer (Daughter)  [phone]    Disposition Plan:  Home  Consults called:  None  Admission status: Inpatient  Unit/ Expected LOS: Med telemetry/2-3 days.   Para Skeans MD [facility]  6 PM- 2 AM. Please contact me via secure Chat 6 PM-2 AM. [phone] ( Pager ) To contact the [facility] Attending or Consulting provider [place] or covering provider  during after hours 7P -7A, for this patient.   Check the care team in [facility] and look for a) attending/consulting [facility] provider listed and b) the [facility] team listed Log into [email or web address] and use Reader's universal password to access. If you do not have the password, please contact the hospital operator. Locate the [facility] provider you are looking for under [facility] and page to a number that you can be directly reached. If you still have difficulty reaching the provider, please page the [facility] (Director on Call) for the Hospitalists listed on amion for assistance. [email or web address] 05/27/2022, 10:47 PM

## 2022-05-27 NOTE — Assessment & Plan Note (Signed)
Expected drop in hemoglobin due to hemoconcentration.  Hemoglobin currently 10.3.  Continue to monitor.

## 2022-05-27 NOTE — ED Provider Notes (Signed)
[facility] Provider Note    Event Date/Time   First MD Initiated Contact with Patient 05/27/22 2046     (approximate)   History   Chief Complaint Leg Pain   HPI  Ricardo Brown is a 61 y.o. male with past medical history of hypertension, hyperlipidemia, diabetes, CAD status post CABG, CHF, atrial fibrillation, PAD, and CKD who presents to the ED complaining of leg pain.  Patient reports that 4 days ago he began having pain in the bottom of his left foot, especially when bearing weight.  He states this is unusual for him as he has neuropathy and does not typically feel much in the foot.  He spoke with his podiatrist, who attempted to drain infection from his foot but did not produce any pus per patient.  He was scheduled for MRI and started on Cipro and Bactrim.  He states that since then, he has had increased redness and swelling tracking up his left leg with no improvement in the pain.  He had a fever of 102.0 earlier today and is concerned he could be developing sepsis, as he has dealt with this previously.     Physical Exam   Triage Vital Signs: ED Triage Vitals  Enc Vitals Group     BP 05/27/22 2006 (!) 117/59     Pulse Rate 05/27/22 2006 94     Resp 05/27/22 2006 20     Temp 05/27/22 2006 99.1 F (37.3 C)     Temp Source 05/27/22 2006 Oral     SpO2 05/27/22 2006 100 %     Weight 05/27/22 2007 209 lb (94.8 kg)     Height 05/27/22 2007 '5\' 8"'$  (1.727 m)     Head Circumference --      Peak Flow --      Pain Score 05/27/22 2009 0     Pain Loc --      Pain Edu? --      Excl. in [place]? --     Most recent vital signs: Vitals:   05/27/22 2006  BP: (!) 117/59  Pulse: 94  Resp: 20  Temp: 99.1 F (37.3 C)  SpO2: 100%    Constitutional: Alert and oriented. Eyes: Conjunctivae are normal. Head: Atraumatic. Nose: No congestion/rhinnorhea. Mouth/Throat: Mucous membranes are moist.  Cardiovascular: Normal rate, regular rhythm. Grossly  normal heart sounds.  2+ radial and DP pulses bilaterally. Respiratory: Normal respiratory effort.  No retractions. Lungs CTAB. Gastrointestinal: Soft and nontender. No distention. Musculoskeletal: Diffuse edema, erythema, and warmth affecting entire left foot and circumferential left lower leg up to just below his left knee.  Small wound to left anterior shin without any purulent drainage.  Site of incision and drainage to base of left foot with small amount of purulent drainage. Neurologic:  Normal speech and language. No gross focal neurologic deficits are appreciated.    ED Results / Procedures / Treatments   Labs (all labs ordered are listed, but only abnormal results are displayed) Labs Reviewed  COMPREHENSIVE METABOLIC PANEL - Abnormal; Notable for the following components:      Result Value   Sodium 132 (*)    Chloride 95 (*)    Glucose, Bld 216 (*)    BUN 58 (*)    Creatinine, Ser 2.77 (*)    GFR, Estimated 25 (*)    All other components within normal limits  LACTIC ACID, PLASMA - Abnormal; Notable for the following components:   Lactic Acid, Venous  2.3 (*)    All other components within normal limits  CBC WITH DIFFERENTIAL/PLATELET - Abnormal; Notable for the following components:   WBC 15.9 (*)    RBC 3.77 (*)    Hemoglobin 11.1 (*)    HCT 33.6 (*)    Neutro Abs 13.7 (*)    Abs Immature Granulocytes 0.08 (*)    All other components within normal limits  CULTURE, BLOOD (ROUTINE X 2)  CULTURE, BLOOD (ROUTINE X 2)  PROTIME-INR  LACTIC ACID, PLASMA  URINALYSIS, ROUTINE W REFLEX MICROSCOPIC    PROCEDURES:  Critical Care performed: Yes, see critical care procedure note(s)  .Critical Care  Performed by: Blake Divine, MD Authorized by: Blake Divine, MD   Critical care provider statement:    Critical care time (minutes):  30   Critical care time was exclusive of:  Separately billable procedures and treating other patients and teaching time   Critical care  was necessary to treat or prevent imminent or life-threatening deterioration of the following conditions:  Sepsis   Critical care was time spent personally by me on the following activities:  Development of treatment plan with patient or surrogate, discussions with consultants, evaluation of patient's response to treatment, examination of patient, ordering and review of laboratory studies, ordering and review of radiographic studies, ordering and performing treatments and interventions, pulse oximetry, re-evaluation of patient's condition and review of old charts   I assumed direction of critical care for this patient from another provider in my specialty: no     Care discussed with: admitting provider      MEDICATIONS ORDERED IN ED: Medications  lactated ringers bolus 1,000 mL (has no administration in time range)  ceFEPIme (MAXIPIME) 2 g in sodium chloride 0.9 % 100 mL IVPB (has no administration in time range)  vancomycin (VANCOCIN) IVPB 1000 mg/200 mL premix (has no administration in time range)  metroNIDAZOLE (FLAGYL) IVPB 500 mg (has no administration in time range)     IMPRESSION / MDM / [place] / ED COURSE  I reviewed the triage vital signs and the nursing notes.                              61 y.o. male with past medical history of hypertension, hyperlipidemia, diabetes, CAD status post CABG, CHF, atrial fibrillation, CKD, and PAD who presents to the ED complaining of increasing left foot and leg swelling with pain over the past 4 days.  Patient's presentation is most consistent with acute presentation with potential threat to life or bodily function.  Differential diagnosis includes, but is not limited to, cellulitis, abscess, osteomyelitis, diabetic foot infection, sepsis.  Patient nontoxic-appearing and in no acute distress, vital signs are unremarkable, however patient does report fever of greater than 102 prior to arrival.  Exam does appear consistent with a  cellulitis, no obvious abscess on exam.  He did have MRI performed 2 days ago and results were reviewed with no evidence of abscess or osteomyelitis at that time.  Labs remarkable for leukocytosis and AKI but with no significant anemia or electrolyte abnormality.  Lactic acid mildly elevated and we will hydrate with IV fluids and trend.  Blood cultures are pending, will start patient on broad-spectrum antibiotics with cefepime, vancomycin, and Flagyl.  Case discussed with hospitalist for admission.      FINAL CLINICAL IMPRESSION(S) / ED DIAGNOSES   Final diagnoses:  Sepsis, due to unspecified organism, unspecified whether acute organ  dysfunction present [facility])  Diabetic foot infection ([place])     Rx / DC Orders   ED Discharge Orders     None        Note:  This document was prepared using Dragon voice recognition software and may include unintentional dictation errors.   Blake Divine, MD 05/27/22 2148

## 2022-05-27 NOTE — Assessment & Plan Note (Signed)
pulses intact.  Pt continue on hydralazine and metoprolol.

## 2022-05-27 NOTE — Progress Notes (Signed)
CODE SEPSIS - PHARMACY COMMUNICATION  **Broad Spectrum Antibiotics should be administered within 1 hour of Sepsis diagnosis**  Time Code Sepsis Called/Page Received: 2136  Antibiotics Ordered: Cefepime + vancomycin + metronidazole  Time of 1st antibiotic administration: 2209  Additional action taken by pharmacy: N/A  Benita Gutter 05/27/2022  9:42 PM

## 2022-05-27 NOTE — Assessment & Plan Note (Addendum)
Patient has failed outpatient therapy with ciprofloxacin and Bactrim. Patient placed on broad-spectrum IV antibiotic therapy to cover MRSA, gram-positive, gram-negative,  Anaerobic and atypical pharmacy protocol based vancomycin, cefepime and continue metronidazole. Upon discharge, prescribed p.o. Doxy and Levaquin to complete 7 days of therapy

## 2022-05-27 NOTE — ED Notes (Signed)
Hospitalist at bedside 

## 2022-05-27 NOTE — ED Triage Notes (Signed)
Pt reports pain and swelling to left lower leg and foot.  Hx diabetes.  Pt reports chills and fever since yesterday. Pt saw podiatry this week and had a MRI.  Pt alert  speech clear.

## 2022-05-27 NOTE — Assessment & Plan Note (Signed)
We will start pt on glycemic protocol. accuchecks q6h.

## 2022-05-27 NOTE — Sepsis Progress Note (Signed)
Following per sepsis protocol   

## 2022-05-27 NOTE — Assessment & Plan Note (Addendum)
Hold losartan.  Acute kidney injury secondary to sepsis.  Continues to improve with IV fluids.  Last month, creatinine at 1.85 with GFR 41

## 2022-05-28 DIAGNOSIS — I5042 Chronic combined systolic (congestive) and diastolic (congestive) heart failure: Secondary | ICD-10-CM

## 2022-05-28 DIAGNOSIS — A419 Sepsis, unspecified organism: Secondary | ICD-10-CM | POA: Diagnosis not present

## 2022-05-28 DIAGNOSIS — E669 Obesity, unspecified: Secondary | ICD-10-CM | POA: Diagnosis not present

## 2022-05-28 DIAGNOSIS — R6520 Severe sepsis without septic shock: Secondary | ICD-10-CM

## 2022-05-28 DIAGNOSIS — E11628 Type 2 diabetes mellitus with other skin complications: Secondary | ICD-10-CM | POA: Diagnosis not present

## 2022-05-28 LAB — COMPREHENSIVE METABOLIC PANEL
ALT: 15 U/L (ref 0–44)
AST: 17 U/L (ref 15–41)
Albumin: 3.4 g/dL — ABNORMAL LOW (ref 3.5–5.0)
Alkaline Phosphatase: 43 U/L (ref 38–126)
Anion gap: 9 (ref 5–15)
BUN: 51 mg/dL — ABNORMAL HIGH (ref 6–20)
CO2: 26 mmol/L (ref 22–32)
Calcium: 8.2 mg/dL — ABNORMAL LOW (ref 8.9–10.3)
Chloride: 96 mmol/L — ABNORMAL LOW (ref 98–111)
Creatinine, Ser: 2.51 mg/dL — ABNORMAL HIGH (ref 0.61–1.24)
GFR, Estimated: 29 mL/min — ABNORMAL LOW (ref >60)
Glucose, Bld: 115 mg/dL — ABNORMAL HIGH (ref 70–99)
Potassium: 4.5 mmol/L (ref 3.5–5.1)
Sodium: 131 mmol/L — ABNORMAL LOW (ref 135–145)
Total Bilirubin: 1.1 mg/dL (ref 0.3–1.2)
Total Protein: 7.1 g/dL (ref 6.5–8.1)

## 2022-05-28 LAB — GLUCOSE, CAPILLARY
Glucose-Capillary: 152 mg/dL — ABNORMAL HIGH (ref 70–99)
Glucose-Capillary: 178 mg/dL — ABNORMAL HIGH (ref 70–99)

## 2022-05-28 LAB — CBC
HCT: 31.1 % — ABNORMAL LOW (ref 39.0–52.0)
Hemoglobin: 10.3 g/dL — ABNORMAL LOW (ref 13.0–17.0)
MCH: 29.6 pg (ref 26.0–34.0)
MCHC: 33.1 g/dL (ref 30.0–36.0)
MCV: 89.4 fL (ref 80.0–100.0)
Platelets: 194 10*3/uL (ref 150–400)
RBC: 3.48 MIL/uL — ABNORMAL LOW (ref 4.22–5.81)
RDW: 13.4 % (ref 11.5–15.5)
WBC: 14.1 10*3/uL — ABNORMAL HIGH (ref 4.0–10.5)
nRBC: 0 % (ref 0.0–0.2)

## 2022-05-28 LAB — URINALYSIS, ROUTINE W REFLEX MICROSCOPIC
Bacteria, UA: NONE SEEN
Bilirubin Urine: NEGATIVE
Glucose, UA: >=500 mg/dL — AB
Hgb urine dipstick: NEGATIVE
Ketones, ur: NEGATIVE mg/dL
Leukocytes,Ua: NEGATIVE
Nitrite: NEGATIVE
Protein, ur: NEGATIVE mg/dL
Specific Gravity, Urine: 1.014 (ref 1.005–1.030)
Squamous Epithelial / HPF: NONE SEEN (ref 0–5)
WBC, UA: NONE SEEN WBC/hpf (ref 0–5)
pH: 6 (ref 5.0–8.0)

## 2022-05-28 LAB — PROCALCITONIN: Procalcitonin: 0.24 ng/mL

## 2022-05-28 LAB — BRAIN NATRIURETIC PEPTIDE: B Natriuretic Peptide: 170.1 pg/mL — ABNORMAL HIGH (ref 0.0–100.0)

## 2022-05-28 LAB — HEMOGLOBIN A1C
Hgb A1c MFr Bld: 6.9 % — ABNORMAL HIGH (ref 4.8–5.6)
Mean Plasma Glucose: 151.33 mg/dL

## 2022-05-28 MED ORDER — ACETAMINOPHEN 325 MG PO TABS
650.0000 mg | ORAL_TABLET | Freq: Four times a day (QID) | ORAL | Status: DC | PRN
  Administered 2022-05-28 – 2022-05-30 (×4): 650 mg via ORAL
  Filled 2022-05-28 (×4): qty 2

## 2022-05-28 NOTE — Assessment & Plan Note (Signed)
Meets criteria for BMI greater than 30 

## 2022-05-28 NOTE — Hospital Course (Addendum)
61 year old male with past medical history of systolic/diastolic heart failure, stage IIIb chronic kidney disease, diabetes mellitus with secondary neuropathy, A-fib and CAD with history of STEMI who presented to the emergency room on 10/25 evening with left leg and foot pain.  4 days prior, patient started having pain in the bottom of his left foot especially when bearing weight which was unusual as his neuropathy causes minimal sensation.  Patient was evaluated by his podiatrist and thought to have a foot abscess and underwent I&D, but no pus was produced.  Patient started on Cipro and Bactrim and MRI was done at that time noting soft tissue swelling consistent with cellulitis, but no evidence of osteomyelitis or abscess.  However, since then, patient has had increased leg with erythema tracking up his left leg and on day of presentation, had a fever of 102.  In the emergency room, patient found to have severe sepsis secondary to cellulitis.  He was admitted to the hospitalist service and started on antibiotics and IV fluids.   Elbow cellulitis clinically appearing improved, patient noted to have fever spikes on 10/26 morning and evening.  Patient complaining of some mild nonproductive cough and COVID and flu panels ordered.

## 2022-05-28 NOTE — Progress Notes (Signed)
[facility] Progress Note  Patient: Ricardo Brown    KNL:[id number]  DOA: 05/27/2022    Date of Service: the patient was seen and examined on 05/28/2022  Brief hospital course: 61 year old male with past medical history of systolic/diastolic heart failure, stage IIIb chronic kidney disease, diabetes mellitus with secondary neuropathy, A-fib and CAD with history of STEMI who presented to the emergency room on 10/25 evening with left leg and foot pain.  4 days prior, patient started having pain in the bottom of his left foot especially when bearing weight which was unusual as his neuropathy causes minimal sensation.  Patient was evaluated by his podiatrist and thought to have a foot abscess and underwent I&D, but no pus was produced.  Patient started on Cipro and Bactrim and MRI was done at that time noting soft tissue swelling consistent with cellulitis, but no evidence of osteomyelitis or abscess.  However, since then, patient has had increased leg with erythema tracking up his left leg and on day of presentation, had a fever of 102.  In the emergency room, patient found to have severe sepsis secondary to cellulitis.  He was admitted to the hospitalist service and started on antibiotics and IV fluids.   Assessment and Plan: Assessment and Plan: * Diabetic foot infection [facility]) Patient has failed outpatient therapy with ciprofloxacin. We will continue patient on broad-spectrum IV antibiotic therapy to cover MRSA, gram-positive, gram-negative,  Anaerobic and atypical pharmacy protocol based vancomycin, cefepime and continue metronidazole. As above.  Severe sepsis with acute organ dysfunction [facility]) Patient met criteria for severe sepsis on admission given lactic acidosis, acute kidney injury with creatinine of 2.5, leukocytosis, tachypnea and heart rate greater than 90 with cellulitis source.  Treated with IV fluids and antibiotics.  Lactic acidosis has resolved.  Sepsis stabilized  although noted patient had fever spike this morning.  Follow procalcitonin level..  Continue current antibiotics.  Based off of recent MRI, no evidence of abscess or osteomyelitis.  Acute kidney injury superimposed on stage 3b chronic kidney disease (HCC) Hold losartan.  Acute kidney injury secondary to sepsis.  Improving with IV fluids.  Last month, creatinine at 1.85 with GFR 41  Chronic combined systolic (congestive) and diastolic (congestive) heart failure ([place]) Initially compensated or even dry due to sepsis.  We will check BNP and follow input and output.   Type 2 diabetes mellitus with diabetic chronic kidney disease (HCC) A1c notes excellent control at 6.9.  Sliding scale.  Avoiding oral medications at this time   PAD (peripheral artery disease) (HCC) pulses intact.  Pt continue on hydralazine and metoprolol.   Hypertension Vitals:   05/27/22 2006 05/27/22 2213  BP: (!) 117/59 (!) 111/59   We will continue patient on metoprolol. We will continue patient on as needed hydralazine as needed for hypertension. We will currently hold patient's metolazone, losartan, torsemide.  Coronary artery disease Stable patient denies any chest discomfort pressure or shortness of breath. We will continue patient on his home regimen of aspirin, metoprolol, statin therapy.   Anemia Expected drop in hemoglobin due to hemoconcentration.  Hemoglobin currently 10.3.  Continue to monitor.  Obesity (BMI 30-39.9) Meets criteria for BMI greater than 30       Body mass index is 31.78 kg/m.        Consultants: None  Procedures: None none  Antimicrobials: IV cefepime, vancomycin and Flagyl 10/25-present  Code Status: Full code   Subjective: Patient states leg is still swollen.  Because of neuropathy, he  does not have any much sensation  Objective: Noted fever this morning Vitals:   05/28/22 0908 05/28/22 1029  BP: 112/69   Pulse: 88   Resp:    Temp: (!) 101.1 F (38.4 C)  99.6 F (37.6 C)  SpO2: 96%     Intake/Output Summary (Last 24 hours) at 05/28/2022 1336 Last data filed at 05/28/2022 1004 Gross per 24 hour  Intake 1559.05 ml  Output 1050 ml  Net 509.05 ml   Filed Weights   05/27/22 2007 05/27/22 2010  Weight: 94.8 kg 94.8 kg   Body mass index is 31.78 kg/m.  Exam:  General: Alert and oriented x3, no acute distress HEENT: Normocephalic, atraumatic, mucous membranes are moist Cardiovascular: Regular rate and rhythm, S1-S2 Respiratory: Clear to auscultation bilaterally Abdomen: Soft, nontender, nondistended, positive bowel sounds Musculoskeletal: No clubbing or cyanosis.  Left lower extremity has approximately trace pitting edema Skin: Left lower extremity no signs of erythema extending from the foot.  There are some small scabbed wounds but no open lesions Psychiatry: Appropriate, no evidence of psychoses Neurology: Chronic peripheral neuropathy  Data Reviewed: Hemoglobin at 10.3.  White blood cell count down to 14.1.  Creatinine down to 2.51, procalcitonin of 0.24  Disposition:  Status is: Inpatient Remains inpatient appropriate because:  -Continue treatment of infection -Improvement in renal function -Assessing for heart failure    Anticipated discharge date: 10/28  Family Communication: Declined for me to call wife DVT Prophylaxis: heparin injection 5,000 Units Start: 05/27/22 2215 SCDs Start: 05/27/22 2200    Author: Annita Brod ,MD 05/28/2022 1:36 PM  To reach On-call, see care teams to locate the attending and reach out via [email or web address]. Between 7PM-7AM, please contact night-coverage If you still have difficulty reaching the attending provider, please page the [facility] (Director on Call) for [facility] on amion for assistance.

## 2022-05-28 NOTE — Consult Note (Signed)
Pharmacy Antibiotic Note  Ricardo Brown is a 61 y.o. male admitted on 05/27/2022 with  left leg pain concerning for diabetic foot infection .  Pharmacy has been consulted for Vancomycin and Cefepime dosing.  Patient currently in AKI(SCR 2.77, baseline in the 1.5s). Will continue to monitor and adjust dosing as appropriate.  Plan: Vancomycin 2g IV x 1 ordered as loading dose. - Will defer AUC dosing currently d/t patient's AKI and dose via random levels. - Vanc random ordered for 10/27'@0000'$  - Once renal function returns to baseline, will transition to AUC dosing  Cefepime 2g IV Q12 hours   Height: '5\' 8"'$  (172.7 cm) Weight: 94.8 kg (209 lb) IBW/kg (Calculated) : 68.4  Temp (24hrs), Avg:100.1 F (37.8 C), Min:99.1 F (37.3 C), Max:101.2 F (38.4 C)  Recent Labs  Lab 05/27/22 2013 05/27/22 2014 05/27/22 2150  WBC 15.9*  --   --   CREATININE 2.77*  --   --   LATICACIDVEN  --  2.3* 1.7    Estimated Creatinine Clearance: 31.7 mL/min (A) (by C-G formula based on SCr of 2.77 mg/dL (H)).    Allergies  Allergen Reactions   Lipitor [Atorvastatin] Palpitations    SVT   Sacubitril-Valsartan Other (See Comments)    SVT Other reaction(s): Other (see comments) Other reaction(s): Other (See Comments) SVT SVT   Pregabalin Palpitations    SVT Other reaction(s): Other (see comments) SVT SVT    Antimicrobials this admission: Vancomycin 10/25 >>  Cefepime 10/25 >>  Metronidazole 10/25 >>  Dose adjustments this admission: N/A  Microbiology results: 10/25 BCx: collected   Thank you for allowing pharmacy to be a part of this patient's care.  Jerral Mccauley A Marti Acebo 05/28/2022 12:42 AM

## 2022-05-28 NOTE — Assessment & Plan Note (Addendum)
Patient met criteria for severe sepsis on admission given lactic acidosis, acute kidney injury with creatinine of 2.5, leukocytosis, tachypnea and heart rate greater than 90 with cellulitis source.  Treated with IV fluids and antibiotics.  Lactic acidosis has resolved.  Sepsis stabilized and procalcitonin level trending downward.  Continue current antibiotics.   Noted persistent fever spike and patient complains of some malaise and aches and nonproductive cough.  Lung exam clear.  Checking COVID and flu panel

## 2022-05-29 DIAGNOSIS — E11628 Type 2 diabetes mellitus with other skin complications: Secondary | ICD-10-CM | POA: Diagnosis not present

## 2022-05-29 DIAGNOSIS — A419 Sepsis, unspecified organism: Secondary | ICD-10-CM | POA: Diagnosis not present

## 2022-05-29 DIAGNOSIS — E669 Obesity, unspecified: Secondary | ICD-10-CM | POA: Diagnosis not present

## 2022-05-29 DIAGNOSIS — I5042 Chronic combined systolic (congestive) and diastolic (congestive) heart failure: Secondary | ICD-10-CM | POA: Diagnosis not present

## 2022-05-29 LAB — BASIC METABOLIC PANEL
Anion gap: 11 (ref 5–15)
BUN: 41 mg/dL — ABNORMAL HIGH (ref 6–20)
CO2: 23 mmol/L (ref 22–32)
Calcium: 8.4 mg/dL — ABNORMAL LOW (ref 8.9–10.3)
Chloride: 101 mmol/L (ref 98–111)
Creatinine, Ser: 1.9 mg/dL — ABNORMAL HIGH (ref 0.61–1.24)
GFR, Estimated: 40 mL/min — ABNORMAL LOW (ref >60)
Glucose, Bld: 108 mg/dL — ABNORMAL HIGH (ref 70–99)
Potassium: 3.7 mmol/L (ref 3.5–5.1)
Sodium: 135 mmol/L (ref 135–145)

## 2022-05-29 LAB — CREATININE, SERUM
Creatinine, Ser: 2.06 mg/dL — ABNORMAL HIGH (ref 0.61–1.24)
GFR, Estimated: 36 mL/min — ABNORMAL LOW (ref >60)

## 2022-05-29 LAB — RESPIRATORY PANEL BY PCR

## 2022-05-29 LAB — CBC
HCT: 29.4 % — ABNORMAL LOW (ref 39.0–52.0)
Hemoglobin: 9.7 g/dL — ABNORMAL LOW (ref 13.0–17.0)
MCH: 29.2 pg (ref 26.0–34.0)
MCHC: 33 g/dL (ref 30.0–36.0)
MCV: 88.6 fL (ref 80.0–100.0)
Platelets: 185 10*3/uL (ref 150–400)
RBC: 3.32 MIL/uL — ABNORMAL LOW (ref 4.22–5.81)
RDW: 13.2 % (ref 11.5–15.5)
WBC: 8.8 10*3/uL (ref 4.0–10.5)
nRBC: 0 % (ref 0.0–0.2)

## 2022-05-29 LAB — BRAIN NATRIURETIC PEPTIDE: B Natriuretic Peptide: 417.8 pg/mL — ABNORMAL HIGH (ref 0.0–100.0)

## 2022-05-29 LAB — VANCOMYCIN, RANDOM: Vancomycin Rm: 8 ug/mL

## 2022-05-29 LAB — GLUCOSE, CAPILLARY: Glucose-Capillary: 115 mg/dL — ABNORMAL HIGH (ref 70–99)

## 2022-05-29 LAB — PROCALCITONIN: Procalcitonin: 0.2 ng/mL

## 2022-05-29 MED ORDER — DOCUSATE SODIUM 100 MG PO CAPS
100.0000 mg | ORAL_CAPSULE | Freq: Two times a day (BID) | ORAL | Status: DC
  Administered 2022-05-29 (×2): 100 mg via ORAL
  Filled 2022-05-29 (×3): qty 1

## 2022-05-29 MED ORDER — SODIUM CHLORIDE 0.9 % IV SOLN: INTRAVENOUS | Status: DC | PRN

## 2022-05-29 MED ORDER — VANCOMYCIN HCL 1250 MG/250ML IV SOLN
1250.0000 mg | INTRAVENOUS | Status: DC
  Administered 2022-05-29 – 2022-05-30 (×2): 1250 mg via INTRAVENOUS
  Filled 2022-05-29 (×2): qty 250

## 2022-05-29 MED ORDER — POLYETHYLENE GLYCOL 3350 17 G PO PACK
17.0000 g | PACK | Freq: Every day | ORAL | Status: DC
  Administered 2022-05-29: 17 g via ORAL
  Filled 2022-05-29 (×2): qty 1

## 2022-05-29 NOTE — Care Management Important Message (Signed)
Important Message  Patient Details  Name: Ricardo Brown MRN: [id number] Date of Birth: 1960-11-13   Medicare Important Message Given:  Yes     Dannette Barbara 05/29/2022, 12:36 PM

## 2022-05-29 NOTE — Plan of Care (Signed)
  Problem: Clinical Measurements: Goal: Ability to avoid or minimize complications of infection will improve Outcome: Progressing   Problem: Education: Goal: Knowledge of General Education information will improve Description: Including pain rating scale, medication(s)/side effects and non-pharmacologic comfort measures Outcome: Progressing   Problem: Clinical Measurements: Goal: Respiratory complications will improve Outcome: Progressing   Problem: Clinical Measurements: Goal: Cardiovascular complication will be avoided Outcome: Progressing   Problem: Activity: Goal: Risk for activity intolerance will decrease Outcome: Progressing   Problem: Pain Managment: Goal: General experience of comfort will improve Outcome: Progressing

## 2022-05-29 NOTE — Plan of Care (Signed)
  Problem: Clinical Measurements: Goal: Ability to avoid or minimize complications of infection will improve Outcome: Progressing   Problem: Skin Integrity: Goal: Skin integrity will improve Outcome: Progressing   Problem: Education: Goal: Knowledge of General Education information will improve Description: Including pain rating scale, medication(s)/side effects and non-pharmacologic comfort measures Outcome: Progressing   Problem: Health Behavior/Discharge Planning: Goal: Ability to manage health-related needs will improve Outcome: Progressing   Problem: Clinical Measurements: Goal: Ability to maintain clinical measurements within normal limits will improve Outcome: Progressing Goal: Will remain free from infection Outcome: Progressing Goal: Diagnostic test results will improve Outcome: Progressing Goal: Respiratory complications will improve Outcome: Progressing Goal: Cardiovascular complication will be avoided Outcome: Progressing   

## 2022-05-29 NOTE — Progress Notes (Signed)
[facility] Progress Note  Patient: Ricardo Brown    DXI:[id number]  DOA: 05/27/2022    Date of Service: the patient was seen and examined on 05/29/2022  Brief hospital course: 61 year old male with past medical history of systolic/diastolic heart failure, stage IIIb chronic kidney disease, diabetes mellitus with secondary neuropathy, A-fib and CAD with history of STEMI who presented to the emergency room on 10/25 evening with left leg and foot pain.  4 days prior, patient started having pain in the bottom of his left foot especially when bearing weight which was unusual as his neuropathy causes minimal sensation.  Patient was evaluated by his podiatrist and thought to have a foot abscess and underwent I&D, but no pus was produced.  Patient started on Cipro and Bactrim and MRI was done at that time noting soft tissue swelling consistent with cellulitis, but no evidence of osteomyelitis or abscess.  However, since then, patient has had increased leg with erythema tracking up his left leg and on day of presentation, had a fever of 102.  In the emergency room, patient found to have severe sepsis secondary to cellulitis.  He was admitted to the hospitalist service and started on antibiotics and IV fluids.   Elbow cellulitis clinically appearing improved, patient noted to have fever spikes on 10/26 morning and evening.  Patient complaining of some mild nonproductive cough and COVID and flu panels ordered.  Assessment and Plan: Assessment and Plan: * Diabetic foot infection [facility]) Patient has failed outpatient therapy with ciprofloxacin. We will continue patient on broad-spectrum IV antibiotic therapy to cover MRSA, gram-positive, gram-negative,  Anaerobic and atypical pharmacy protocol based vancomycin, cefepime and continue metronidazole. As above.  Severe sepsis with acute organ dysfunction (HCC)-resolved as of 05/29/2022 Patient met criteria for severe sepsis on admission given  lactic acidosis, acute kidney injury with creatinine of 2.5, leukocytosis, tachypnea and heart rate greater than 90 with cellulitis source.  Treated with IV fluids and antibiotics.  Lactic acidosis has resolved.  Sepsis stabilized and procalcitonin level trending downward.  Continue current antibiotics.   Noted persistent fever spike and patient complains of some malaise and aches and nonproductive cough.  Lung exam clear.  Checking COVID and flu panel  Acute kidney injury superimposed on stage 3b chronic kidney disease (HCC) Hold losartan.  Acute kidney injury secondary to sepsis.  Continues to improve with IV fluids.  Last month, creatinine at 1.85 with GFR 41  Chronic combined systolic (congestive) and diastolic (congestive) heart failure ([place]) Initially compensated or even dry due to sepsis.  We will check BNP and follow input and output.  BNP in May notes ejection fraction of 45 to 76% grade 2 diastolic dysfunction.  Mildly elevated BNP of 400.  Blood pressure still slightly soft.  Will give 1 dose of Lasix prior to discharge  Type 2 diabetes mellitus with diabetic chronic kidney disease ([place]) A1c notes excellent control at 6.9.  Sliding scale.  Avoiding oral medications at this time   PAD (peripheral artery disease) (HCC) pulses intact.  Pt continue on hydralazine and metoprolol.   Hypertension Vitals:   05/27/22 2006 05/27/22 2213  BP: (!) 117/59 (!) 111/59   We will continue patient on metoprolol. We will continue patient on as needed hydralazine as needed for hypertension. We will currently hold patient's metolazone, losartan, torsemide.  Coronary artery disease Stable patient denies any chest discomfort pressure or shortness of breath. We will continue patient on his home regimen of aspirin, metoprolol, statin therapy.  Anemia Expected drop in hemoglobin due to hemoconcentration.  Hemoglobin currently 10.3.  Continue to monitor.  Obesity (BMI 30-39.9) Meets criteria for  BMI greater than 30       Body mass index is 32.05 kg/m.        Consultants: None  Procedures: None none  Antimicrobials: IV cefepime, vancomycin and Flagyl 10/25-present  Code Status: Full code   Subjective: Patient feels like leg is less swollen.  Complaining of malaise and nonproductive cough this morning.  Objective: Noted fever this morning Vitals:   05/29/22 0746 05/29/22 1600  BP: 112/61 (!) 112/59  Pulse: 78 75  Resp: 16 17  Temp: 98.7 F (37.1 C) 99.1 F (37.3 C)  SpO2: 97% 97%    Intake/Output Summary (Last 24 hours) at 05/29/2022 1600 Last data filed at 05/29/2022 1038 Gross per 24 hour  Intake 1958.47 ml  Output 1855 ml  Net 103.47 ml    Filed Weights   05/27/22 2007 05/27/22 2010 05/29/22 0500  Weight: 94.8 kg 94.8 kg 95.6 kg   Body mass index is 32.05 kg/m.  Exam:  General: Alert and oriented x3, no acute distress HEENT: Normocephalic, atraumatic, mucous membranes are moist Cardiovascular: Regular rate and rhythm, S1-S2 Respiratory: Clear to auscultation bilaterally Abdomen: Soft, nontender, nondistended, positive bowel sounds Musculoskeletal: No clubbing or cyanosis.  Left lower extremity has approximately trace pitting edema Skin: Left lower extremity no signs of erythema extending from the foot, much improved from previous day.  There are some small scabbed wounds but no open lesions Psychiatry: Appropriate, no evidence of psychoses Neurology: Chronic peripheral neuropathy  Data Reviewed: White count normalized.  Creatinine and procalcitonin level trending downward  Disposition:  Status is: Inpatient Remains inpatient appropriate because:  -Continue treatment of infection -COVID and flu titers -Improvement in renal function -Need to diurese    Anticipated discharge date: 10/28  Family Communication: Declined for me to call wife DVT Prophylaxis: heparin injection 5,000 Units Start: 05/27/22 2215 SCDs Start: 05/27/22  2200    Author: Annita Brod ,MD 05/29/2022 4:00 PM  To reach On-call, see care teams to locate the attending and reach out via [email or web address]. Between 7PM-7AM, please contact night-coverage If you still have difficulty reaching the attending provider, please page the [facility] (Director on Call) for [facility] on amion for assistance.

## 2022-05-29 NOTE — Consult Note (Signed)
Pharmacy Antibiotic Note  Ricardo Brown is a 61 y.o. male admitted on 05/27/2022 with  left leg pain concerning for diabetic foot infection .  Pharmacy has been consulted for Vancomycin and Cefepime dosing.  Patient currently in AKI but improving(SCR 2.77-->2.06-->1.90, baseline in the 1.5s). Will continue to monitor and adjust dosing as appropriate.  Plan: Vancomycin 1250 mg IV Q 24 hrs. Goal AUC 400-550. Expected AUC:  483 Expected Cmin: 13.1 SCr used: 1.90   Cefepime 2g IV Q12 hours   Height: '5\' 8"'$  (172.7 cm) Weight: 95.6 kg (210 lb 12.2 oz) IBW/kg (Calculated) : 68.4  Temp (24hrs), Avg:99.5 F (37.5 C), Min:98.4 F (36.9 C), Max:101.4 F (38.6 C)  Recent Labs  Lab 05/27/22 2013 05/27/22 2014 05/27/22 2150 05/28/22 0151 05/28/22 2355 05/29/22 0053 05/29/22 0527  WBC 15.9*  --   --  14.1*  --   --  8.8  CREATININE 2.77*  --   --  2.51*  --  2.06* 1.90*  LATICACIDVEN  --  2.3* 1.7  --   --   --   --   VANCORANDOM  --   --   --   --  8  --   --      Estimated Creatinine Clearance: 46.4 mL/min (A) (by C-G formula based on SCr of 1.9 mg/dL (H)).    Allergies  Allergen Reactions   Lipitor [Atorvastatin] Palpitations    SVT   Sacubitril-Valsartan Other (See Comments)    SVT Other reaction(s): Other (see comments) Other reaction(s): Other (See Comments) SVT SVT   Pregabalin Palpitations    SVT Other reaction(s): Other (see comments) SVT SVT    Antimicrobials this admission: Vancomycin 10/25 >>  Cefepime 10/25 >>  Metronidazole 10/25 >>  Dose adjustments this admission: N/A  Microbiology results: 10/25 BCx: NGTD   Thank you for allowing pharmacy to be a part of this patient's care.  Adyen Bifulco A 05/29/2022 11:21 AM

## 2022-05-29 NOTE — Consult Note (Signed)
Pharmacy Antibiotic Note  Ricardo Brown is a 61 y.o. male admitted on 05/27/2022 with  left leg pain concerning for diabetic foot infection .  Pharmacy has been consulted for Vancomycin and Cefepime dosing.  Patient currently in AKI but improving(SCR 2.77-->2.06, baseline in the 1.5s). Will continue to monitor and adjust dosing as appropriate.  Plan: Vancomycin 1250 mg IV Q 24 hrs. Goal AUC 400-550. Expected AUC: 522.9 Expected Cmin: 14.6 SCr used: 2.07   Cefepime 2g IV Q12 hours   Height: '5\' 8"'$  (172.7 cm) Weight: 94.8 kg (209 lb) IBW/kg (Calculated) : 68.4  Temp (24hrs), Avg:100 F (37.8 C), Min:98.7 F (37.1 C), Max:101.4 F (38.6 C)  Recent Labs  Lab 05/27/22 2013 05/27/22 2014 05/27/22 2150 05/28/22 0151 05/28/22 2355 05/29/22 0053  WBC 15.9*  --   --  14.1*  --   --   CREATININE 2.77*  --   --  2.51*  --  2.06*  LATICACIDVEN  --  2.3* 1.7  --   --   --   VANCORANDOM  --   --   --   --  8  --      Estimated Creatinine Clearance: 42.6 mL/min (A) (by C-G formula based on SCr of 2.06 mg/dL (H)).    Allergies  Allergen Reactions   Lipitor [Atorvastatin] Palpitations    SVT   Sacubitril-Valsartan Other (See Comments)    SVT Other reaction(s): Other (see comments) Other reaction(s): Other (See Comments) SVT SVT   Pregabalin Palpitations    SVT Other reaction(s): Other (see comments) SVT SVT    Antimicrobials this admission: Vancomycin 10/25 >>  Cefepime 10/25 >>  Metronidazole 10/25 >>  Dose adjustments this admission: N/A  Microbiology results: 10/25 BCx: NGTD   Thank you for allowing pharmacy to be a part of this patient's care.  Jamee Pacholski A Oluwatosin Higginson 05/29/2022 1:26 AM

## 2022-05-29 NOTE — TOC Initial Note (Signed)
Transition of Care [facility]) - Initial/Assessment Note    Patient Details  Name: Ricardo Brown MRN: [id number] Date of Birth: [birth year]  [name]) CM/SW Contact:    Candie Chroman, LCSW Phone Number: 05/29/2022, 11:32 AM  Clinical Narrative:  Readmission prevention screen complete. CSW met with patient. No supports at bedside. CSW introduced role and explained that discharge planning would be discussed. PCP is [name], MD. Patient drives himself to appointments. Pharmacy is Paediatric nurse on [address]. He reports having difficulties obtaining medications at times due to communication between doctors office and pharmacy. No home health or DME use prior to discharge. No further concerns. CSW encouraged patient to contact CSW as needed. CSW will continue to follow patient for support and facilitate return home once stable. Wife or daughter will transport him home at discharge.                Expected Discharge Plan: Home/Self Care Barriers to Discharge: Continued Medical Work up   Patient Goals and CMS Choice        Expected Discharge Plan and Services Expected Discharge Plan: Home/Self Care     Post Acute Care Choice: NA Living arrangements for the past 2 months: Single Family Home                                      Prior Living Arrangements/Services Living arrangements for the past 2 months: Single Family Home   Patient language and need for interpreter reviewed:: Yes Do you feel safe going back to the place where you live?: Yes      Need for Family Participation in Patient Care: Yes (Comment) Care giver support system in place?: Yes (comment)   Criminal Activity/Legal Involvement Pertinent to Current Situation/Hospitalization: No - Comment as needed  Activities of Daily Living Home Assistive Devices/Equipment: CPAP ADL Screening (condition at time of admission) Patient's cognitive ability adequate to safely complete daily activities?:  Yes Is the patient deaf or have difficulty hearing?: No Does the patient have difficulty seeing, even when wearing glasses/contacts?: No Does the patient have difficulty concentrating, remembering, or making decisions?: No Patient able to express need for assistance with ADLs?: No Does the patient have difficulty dressing or bathing?: No Independently performs ADLs?: Yes (appropriate for developmental age) Does the patient have difficulty walking or climbing stairs?: No Weakness of Legs: None Weakness of Arms/Hands: None  Permission Sought/Granted                  Emotional Assessment Appearance:: Appears stated age Attitude/Demeanor/Rapport: Engaged, Gracious Affect (typically observed): Accepting, Appropriate, Calm, Pleasant Orientation: : Oriented to Self, Oriented to Place, Oriented to  Time, Oriented to Situation Alcohol / Substance Use: Not Applicable Psych Involvement: No (comment)  Admission diagnosis:  Diabetic foot infection ([place]) [N82.956, L08.9] Sepsis, due to unspecified organism, unspecified whether acute organ dysfunction present [facility]) [A41.9] Patient Active Problem List   Diagnosis Date Noted   Diabetic foot infection ([place]) 05/27/2022   Anemia 05/27/2022   Severe sepsis with acute organ dysfunction ([place]) 12/19/2021   Morbid obesity ([place]) 12/19/2021   Cellulitis of left lower extremity 12/19/2021   Acute kidney injury superimposed on stage 3b chronic kidney disease ([place]) 12/19/2021   Stage 3a chronic kidney disease (CKD) ([place]) 12/19/2021   Dermatitis 12/04/2021   Overweight 10/23/2021   Acquired absence of other left toe(s) ([place]) 09/25/2021   Congestive  heart failure ([place]) 09/25/2021   Edema, unspecified 09/25/2021   Osteomyelitis, unspecified ([place]) 09/25/2021   Personal history of surgery to heart and great vessels, presenting hazards to health 09/25/2021   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 08/28/2021   Chronic combined systolic (congestive) and diastolic  (congestive) heart failure ([place]) 08/28/2021   PAD (peripheral artery disease) ([place]) 08/28/2021   Restless leg syndrome 05/21/2021   History of colonic polyps    Lipoma of colon    History of stroke 11/13/2019   Obesity (BMI 30-39.9) 10/04/2019   Numbness 09/07/2019   Bilateral hand numbness 07/21/2019   Imbalance 06/27/2019   Neuropathy 06/27/2019   Screening for colon cancer    Polyp of colon    Coronary artery disease 05/31/2019   PSVT (paroxysmal supraventricular tachycardia) 03/22/2018   Type 2 diabetes mellitus with diabetic chronic kidney disease ([place]) 03/07/2018   HLD (hyperlipidemia) 03/07/2018   Hypertension 03/07/2018   Right ventricular dysfunction 02/02/2018   Postoperative atrial fibrillation ([place]) 02/01/2018   S/P CABG x 3 01/28/2018   PCP:  [name], MD Pharmacy:   [facility] [address] [place] [address] Phone: [phone] Fax: [place], [place] [address] Phone: [phone] Fax: [phone]     Social Determinants of Health (SDOH) Interventions    Readmission Risk Interventions    05/29/2022   11:31 AM  Readmission Risk Prevention Plan  Transportation Screening Complete  PCP or Specialist Appt within 5-7 Days Complete  Medication Review (RN CM) Complete

## 2022-05-30 DIAGNOSIS — I5042 Chronic combined systolic (congestive) and diastolic (congestive) heart failure: Secondary | ICD-10-CM | POA: Diagnosis not present

## 2022-05-30 DIAGNOSIS — A419 Sepsis, unspecified organism: Secondary | ICD-10-CM | POA: Diagnosis not present

## 2022-05-30 DIAGNOSIS — E11628 Type 2 diabetes mellitus with other skin complications: Secondary | ICD-10-CM | POA: Diagnosis not present

## 2022-05-30 DIAGNOSIS — E669 Obesity, unspecified: Secondary | ICD-10-CM | POA: Diagnosis not present

## 2022-05-30 LAB — BASIC METABOLIC PANEL
Anion gap: 8 (ref 5–15)
BUN: 32 mg/dL — ABNORMAL HIGH (ref 6–20)
CO2: 24 mmol/L (ref 22–32)
Calcium: 8.3 mg/dL — ABNORMAL LOW (ref 8.9–10.3)
Chloride: 100 mmol/L (ref 98–111)
Creatinine, Ser: 1.59 mg/dL — ABNORMAL HIGH (ref 0.61–1.24)
GFR, Estimated: 49 mL/min — ABNORMAL LOW (ref >60)
Glucose, Bld: 117 mg/dL — ABNORMAL HIGH (ref 70–99)
Potassium: 3.9 mmol/L (ref 3.5–5.1)
Sodium: 132 mmol/L — ABNORMAL LOW (ref 135–145)

## 2022-05-30 LAB — PROCALCITONIN: Procalcitonin: 0.14 ng/mL

## 2022-05-30 LAB — GLUCOSE, CAPILLARY: Glucose-Capillary: 128 mg/dL — ABNORMAL HIGH (ref 70–99)

## 2022-05-30 MED ORDER — DOXYCYCLINE MONOHYDRATE 100 MG PO TABS: 100.0000 mg | ORAL_TABLET | Freq: Two times a day (BID) | ORAL | 0 refills | Status: DC

## 2022-05-30 MED ORDER — LEVOFLOXACIN 750 MG PO TABS: 750.0000 mg | ORAL_TABLET | ORAL | 0 refills | Status: DC

## 2022-05-30 NOTE — Discharge Summary (Signed)
Physician Discharge Summary   Patient: Ricardo Brown MRN: [id number] DOB: Aug 23, 1960  Admit date:     05/27/2022  Discharge date: {dischdate:26783}  Discharge Physician: Annita Brod   PCP: [name], MD   Recommendations at discharge:  {Tip this will not be part of the note when signed- Example include specific recommendations for outpatient follow-up, pending tests to follow-up on. (Optional):26781}  ***  Discharge Diagnoses: Principal Problem:   Diabetic foot infection ([place]) Active Problems:   Acute kidney injury superimposed on stage 3b chronic kidney disease (HCC)   Chronic combined systolic (congestive) and diastolic (congestive) heart failure (HCC)   Type 2 diabetes mellitus with diabetic chronic kidney disease (HCC)   PAD (peripheral artery disease) (HCC)   Hypertension   Coronary artery disease   Anemia   Obesity (BMI 30-39.9)  Resolved Problems:   Severe sepsis with acute organ dysfunction [facility])  Hospital Course: 61 year old male with past medical history of systolic/diastolic heart failure, stage IIIb chronic kidney disease, diabetes mellitus with secondary neuropathy, A-fib and CAD with history of STEMI who presented to the emergency room on 10/25 evening with left leg and foot pain.  4 days prior, patient started having pain in the bottom of his left foot especially when bearing weight which was unusual as his neuropathy causes minimal sensation.  Patient was evaluated by his podiatrist and thought to have a foot abscess and underwent I&D, but no pus was produced.  Patient started on Cipro and Bactrim and MRI was done at that time noting soft tissue swelling consistent with cellulitis, but no evidence of osteomyelitis or abscess.  However, since then, patient has had increased leg with erythema tracking up his left leg and on day of presentation, had a fever of 102.  In the emergency room, patient found to have severe sepsis secondary to cellulitis.   He was admitted to the hospitalist service and started on antibiotics and IV fluids.   Elbow cellulitis clinically appearing improved, patient noted to have fever spikes on 10/26 morning and evening.  Patient complaining of some mild nonproductive cough and COVID and flu panels ordered.  Assessment and Plan: * Diabetic foot infection [facility]) Patient has failed outpatient therapy with ciprofloxacin. We will continue patient on broad-spectrum IV antibiotic therapy to cover MRSA, gram-positive, gram-negative,  Anaerobic and atypical pharmacy protocol based vancomycin, cefepime and continue metronidazole. As above.  Severe sepsis with acute organ dysfunction (HCC)-resolved as of 05/29/2022 Patient met criteria for severe sepsis on admission given lactic acidosis, acute kidney injury with creatinine of 2.5, leukocytosis, tachypnea and heart rate greater than 90 with cellulitis source.  Treated with IV fluids and antibiotics.  Lactic acidosis has resolved.  Sepsis stabilized and procalcitonin level trending downward.  Continue current antibiotics.   Noted persistent fever spike and patient complains of some malaise and aches and nonproductive cough.  Lung exam clear.  Checking COVID and flu panel  Acute kidney injury superimposed on stage 3b chronic kidney disease (HCC) Hold losartan.  Acute kidney injury secondary to sepsis.  Continues to improve with IV fluids.  Last month, creatinine at 1.85 with GFR 41  Chronic combined systolic (congestive) and diastolic (congestive) heart failure ([place]) Initially compensated or even dry due to sepsis.  We will check BNP and follow input and output.  BNP in May notes ejection fraction of 45 to 55% grade 2 diastolic dysfunction.  Mildly elevated BNP of 400.  Blood pressure still slightly soft.  Will give 1 dose of  Lasix prior to discharge  Type 2 diabetes mellitus with diabetic chronic kidney disease ([place]) A1c notes excellent control at 6.9.  Sliding scale.   Avoiding oral medications at this time   PAD (peripheral artery disease) (HCC) pulses intact.  Pt continue on hydralazine and metoprolol.   Hypertension Vitals:   05/27/22 2006 05/27/22 2213  BP: (!) 117/59 (!) 111/59   We will continue patient on metoprolol. We will continue patient on as needed hydralazine as needed for hypertension. We will currently hold patient's metolazone, losartan, torsemide.  Coronary artery disease Stable patient denies any chest discomfort pressure or shortness of breath. We will continue patient on his home regimen of aspirin, metoprolol, statin therapy.   Anemia Expected drop in hemoglobin due to hemoconcentration.  Hemoglobin currently 10.3.  Continue to monitor.  Obesity (BMI 30-39.9) Meets criteria for BMI greater than 30      {Tip this will not be part of the note when signed Body mass index is 32.01 kg/m. , ,  (Optional):26781}  {(NOTE) Pain control PDMP Statment (Optional):26782} Consultants: *** Procedures performed: ***  Disposition: {Plan; Disposition:26390} Diet recommendation:  Discharge Diet Orders (From admission, onward)     Start     Ordered   05/30/22 0000  Diet - low sodium heart healthy        05/30/22 1529           {Diet_Plan:26776} DISCHARGE MEDICATION: Allergies as of 05/30/2022       Reactions   Lipitor [atorvastatin] Palpitations   SVT   Sacubitril-valsartan Other (See Comments)   SVT Other reaction(s): Other (see comments) Other reaction(s): Other (See Comments) SVT SVT   Pregabalin Palpitations   SVT Other reaction(s): Other (see comments) SVT SVT     Med Rec must be completed prior to using this [facility]***       Discharge Exam: Filed Weights   05/27/22 2010 05/29/22 0500 05/30/22 0420  Weight: 94.8 kg 95.6 kg 95.5 kg   ***  Condition at discharge: {DC Condition:26389}  The results of significant diagnostics from this hospitalization (including imaging, microbiology,  ancillary and laboratory) are listed below for reference.   Imaging Studies: DG Chest 2 View  Result Date: 05/27/2022 CLINICAL DATA:  Foot swelling and fever. EXAM: CHEST - 2 VIEW COMPARISON:  12/19/2021 FINDINGS: Postoperative changes in the mediastinum. Heart size and pulmonary vascularity are normal. Lungs are clear. No pleural effusions. No pneumothorax. Mediastinal contours appear intact. IMPRESSION: No active cardiopulmonary disease. Electronically Signed   By: Lucienne Capers M.D.   On: 05/27/2022 20:57   MR FOOT LEFT WO CONTRAST  Result Date: 05/25/2022 CLINICAL DATA:  Foot pain, diabetic EXAM: MRI OF THE LEFT FOOT WITHOUT CONTRAST TECHNIQUE: Multiplanar, multisequence MR imaging of the left forefoot was performed. No intravenous contrast was administered. COMPARISON:  Left foot radiograph 12/22/2021, left foot MRI 12/20/2021 FINDINGS: Bones/Joint/Cartilage Prior amputation of the second digit distal phalanx. There is periarticular marrow edema in the midfoot most prominent at the second and third tarsometatarsal joints related to moderate osteoarthritis, unchanged from prior. There is no other significant marrow signal alteration. Ligaments Intact Lisfranc ligament.  Intact MTP collateral ligaments. Muscles and Tendons Diffuse intramuscular edema and atrophy in the foot as is commonly seen in diabetics likely related to denervation change. No acute tendon tear in the forefoot. Soft tissues Diffuse soft tissue swelling of the foot. There is no focal fluid collection. No susceptibility artifact identified. IMPRESSION: No evidence of osteomyelitis or soft tissue abscess. Diffuse soft tissue  swelling, as can be seen in lymphedema or cellulitis. Moderate second and third tarsometatarsal joint osteoarthritis. Electronically Signed   By: Maurine Simmering M.D.   On: 05/25/2022 15:52    Microbiology: Results for orders placed or performed during the hospital encounter of 05/27/22  Culture, blood (Routine  x 2)     Status: None (Preliminary result)   Collection Time: 05/27/22  8:14 PM   Specimen: BLOOD RIGHT FOREARM  Result Value Ref Range Status   Specimen Description BLOOD RIGHT FOREARM  Final   Special Requests   Final    BOTTLES DRAWN AEROBIC AND ANAEROBIC Blood Culture adequate volume   Culture   Final    NO GROWTH 2 DAYS Performed at [facility], [address], [place], [place] [address]    Report Status PENDING  Incomplete  Culture, blood (Routine x 2)     Status: None (Preliminary result)   Collection Time: 05/27/22  9:50 PM   Specimen: BLOOD  Result Value Ref Range Status   Specimen Description BLOOD BLOOD RIGHT ARM  Final   Special Requests   Final    Blood Culture results may not be optimal due to an excessive volume of blood received in culture bottles   Culture   Final    NO GROWTH 2 DAYS Performed at [facility], [address] [place], [place] [address]    Report Status PENDING  Incomplete  Respiratory (~20 pathogens) panel by PCR     Status: None   Collection Time: 05/29/22 12:45 PM   Specimen: Nasopharyngeal Swab; Respiratory  Result Value Ref Range Status   Adenovirus NOT DETECTED NOT DETECTED Final   Coronavirus 229E NOT DETECTED NOT DETECTED Final    Comment: (NOTE) The Coronavirus on the Respiratory Panel, DOES NOT test for the novel  Coronavirus (2019 nCoV)    Coronavirus HKU1 NOT DETECTED NOT DETECTED Final   Coronavirus NL63 NOT DETECTED NOT DETECTED Final   Coronavirus OC43 NOT DETECTED NOT DETECTED Final   Metapneumovirus NOT DETECTED NOT DETECTED Final   Rhinovirus / Enterovirus NOT DETECTED NOT DETECTED Final   Influenza A NOT DETECTED NOT DETECTED Final   Influenza B NOT DETECTED NOT DETECTED Final   Parainfluenza Virus 1 NOT DETECTED NOT DETECTED Final   Parainfluenza Virus 2 NOT DETECTED NOT DETECTED Final   Parainfluenza Virus 3 NOT DETECTED NOT DETECTED Final   Parainfluenza Virus 4 NOT DETECTED NOT DETECTED Final    Respiratory Syncytial Virus NOT DETECTED NOT DETECTED Final   Bordetella pertussis NOT DETECTED NOT DETECTED Final   Bordetella Parapertussis NOT DETECTED NOT DETECTED Final   Chlamydophila pneumoniae NOT DETECTED NOT DETECTED Final   Mycoplasma pneumoniae NOT DETECTED NOT DETECTED Final    Comment: Performed at [facility], [place] [address] [place], [place] [address]    Labs: CBC: Recent Labs  Lab 05/27/22 2013 05/28/22 0151 05/29/22 0527  WBC 15.9* 14.1* 8.8  NEUTROABS 13.7*  --   --   HGB 11.1* 10.3* 9.7*  HCT 33.6* 31.1* 29.4*  MCV 89.1 89.4 88.6  PLT 221 194 625   Basic Metabolic Panel: Recent Labs  Lab 05/27/22 2013 05/27/22 2014 05/28/22 0151 05/29/22 0053 05/29/22 0527 05/30/22 0522  NA 132*  --  131*  --  135 132*  K 4.2  --  4.5  --  3.7 3.9  CL 95*  --  96*  --  101 100  CO2 23  --  26  --  23  24  GLUCOSE 216*  --  115*  --  108* 117*  BUN 58*  --  51*  --  41* 32*  CREATININE 2.77*  --  2.51* 2.06* 1.90* 1.59*  CALCIUM 9.0  --  8.2*  --  8.4* 8.3*  MG  --  2.2  --   --   --   --   PHOS  --  3.9  --   --   --   --    Liver Function Tests: Recent Labs  Lab 05/27/22 2013 05/28/22 0151  AST 23 17  ALT 17 15  ALKPHOS 48 43  BILITOT 1.0 1.1  PROT 8.0 7.1  ALBUMIN 4.0 3.4*   CBG: Recent Labs  Lab 05/28/22 0814 05/28/22 1622 05/29/22 0826 05/30/22 0808  GLUCAP 152* 178* 115* 128*    Discharge time spent: {LESS THAN/GREATER HFGB:02111} 30 minutes.  Signed: Annita Brod, MD [facility] 05/30/2022

## 2022-06-01 ENCOUNTER — Encounter (INDEPENDENT_AMBULATORY_CARE_PROVIDER_SITE_OTHER): Payer: Self-pay

## 2022-06-01 LAB — CULTURE, BLOOD (ROUTINE X 2)
Culture: NO GROWTH
Culture: NO GROWTH
Special Requests: ADEQUATE

## 2022-06-02 ENCOUNTER — Encounter: Payer: Self-pay | Admitting: Emergency Medicine

## 2022-06-02 ENCOUNTER — Other Ambulatory Visit: Payer: Self-pay

## 2022-06-02 ENCOUNTER — Emergency Department: Payer: Medicare Other

## 2022-06-02 ENCOUNTER — Inpatient Hospital Stay
Admission: EM | Admit: 2022-06-02 | Discharge: 2022-06-09 | DRG: 253 | Disposition: A | Discharge status: Home / Self Care | Payer: Medicare Other | Attending: Internal Medicine | Admitting: Internal Medicine

## 2022-06-02 DIAGNOSIS — I252 Old myocardial infarction: Secondary | ICD-10-CM

## 2022-06-02 DIAGNOSIS — E1152 Type 2 diabetes mellitus with diabetic peripheral angiopathy with gangrene: Principal | ICD-10-CM | POA: Diagnosis present

## 2022-06-02 DIAGNOSIS — N179 Acute kidney failure, unspecified: Secondary | ICD-10-CM | POA: Diagnosis present

## 2022-06-02 DIAGNOSIS — E11621 Type 2 diabetes mellitus with foot ulcer: Secondary | ICD-10-CM | POA: Diagnosis present

## 2022-06-02 DIAGNOSIS — E7800 Pure hypercholesterolemia, unspecified: Secondary | ICD-10-CM | POA: Diagnosis present

## 2022-06-02 DIAGNOSIS — Z89422 Acquired absence of other left toe(s): Secondary | ICD-10-CM | POA: Diagnosis not present

## 2022-06-02 DIAGNOSIS — Z794 Long term (current) use of insulin: Secondary | ICD-10-CM | POA: Diagnosis not present

## 2022-06-02 DIAGNOSIS — L97529 Non-pressure chronic ulcer of other part of left foot with unspecified severity: Secondary | ICD-10-CM | POA: Diagnosis present

## 2022-06-02 DIAGNOSIS — L02612 Cutaneous abscess of left foot: Secondary | ICD-10-CM | POA: Diagnosis present

## 2022-06-02 DIAGNOSIS — E1142 Type 2 diabetes mellitus with diabetic polyneuropathy: Secondary | ICD-10-CM | POA: Diagnosis present

## 2022-06-02 DIAGNOSIS — Z9889 Other specified postprocedural states: Secondary | ICD-10-CM | POA: Diagnosis not present

## 2022-06-02 DIAGNOSIS — E11628 Type 2 diabetes mellitus with other skin complications: Secondary | ICD-10-CM | POA: Diagnosis present

## 2022-06-02 DIAGNOSIS — E1122 Type 2 diabetes mellitus with diabetic chronic kidney disease: Secondary | ICD-10-CM | POA: Diagnosis present

## 2022-06-02 DIAGNOSIS — I70245 Atherosclerosis of native arteries of left leg with ulceration of other part of foot: Secondary | ICD-10-CM | POA: Diagnosis not present

## 2022-06-02 DIAGNOSIS — Z89421 Acquired absence of other right toe(s): Secondary | ICD-10-CM | POA: Diagnosis not present

## 2022-06-02 DIAGNOSIS — L089 Local infection of the skin and subcutaneous tissue, unspecified: Secondary | ICD-10-CM | POA: Diagnosis present

## 2022-06-02 DIAGNOSIS — Z7984 Long term (current) use of oral hypoglycemic drugs: Secondary | ICD-10-CM

## 2022-06-02 DIAGNOSIS — I340 Nonrheumatic mitral (valve) insufficiency: Secondary | ICD-10-CM | POA: Diagnosis present

## 2022-06-02 DIAGNOSIS — Z9861 Coronary angioplasty status: Secondary | ICD-10-CM

## 2022-06-02 DIAGNOSIS — I7025 Atherosclerosis of native arteries of other extremities with ulceration: Secondary | ICD-10-CM | POA: Diagnosis present

## 2022-06-02 DIAGNOSIS — I10 Essential (primary) hypertension: Secondary | ICD-10-CM | POA: Diagnosis present

## 2022-06-02 DIAGNOSIS — E1165 Type 2 diabetes mellitus with hyperglycemia: Secondary | ICD-10-CM | POA: Diagnosis present

## 2022-06-02 DIAGNOSIS — B9561 Methicillin susceptible Staphylococcus aureus infection as the cause of diseases classified elsewhere: Secondary | ICD-10-CM | POA: Diagnosis present

## 2022-06-02 DIAGNOSIS — N183 Chronic kidney disease, stage 3 unspecified: Secondary | ICD-10-CM | POA: Diagnosis not present

## 2022-06-02 DIAGNOSIS — I130 Hypertensive heart and chronic kidney disease with heart failure and stage 1 through stage 4 chronic kidney disease, or unspecified chronic kidney disease: Secondary | ICD-10-CM | POA: Diagnosis present

## 2022-06-02 DIAGNOSIS — Z951 Presence of aortocoronary bypass graft: Secondary | ICD-10-CM

## 2022-06-02 DIAGNOSIS — E669 Obesity, unspecified: Secondary | ICD-10-CM | POA: Diagnosis present

## 2022-06-02 DIAGNOSIS — L03116 Cellulitis of left lower limb: Secondary | ICD-10-CM | POA: Diagnosis present

## 2022-06-02 DIAGNOSIS — Z79899 Other long term (current) drug therapy: Secondary | ICD-10-CM

## 2022-06-02 DIAGNOSIS — Z888 Allergy status to other drugs, medicaments and biological substances status: Secondary | ICD-10-CM

## 2022-06-02 DIAGNOSIS — M19072 Primary osteoarthritis, left ankle and foot: Secondary | ICD-10-CM | POA: Diagnosis present

## 2022-06-02 DIAGNOSIS — I2510 Atherosclerotic heart disease of native coronary artery without angina pectoris: Secondary | ICD-10-CM | POA: Diagnosis present

## 2022-06-02 DIAGNOSIS — Z7982 Long term (current) use of aspirin: Secondary | ICD-10-CM

## 2022-06-02 DIAGNOSIS — N1832 Chronic kidney disease, stage 3b: Secondary | ICD-10-CM | POA: Diagnosis present

## 2022-06-02 DIAGNOSIS — Z8249 Family history of ischemic heart disease and other diseases of the circulatory system: Secondary | ICD-10-CM

## 2022-06-02 DIAGNOSIS — Z6832 Body mass index (BMI) 32.0-32.9, adult: Secondary | ICD-10-CM | POA: Diagnosis not present

## 2022-06-02 DIAGNOSIS — I5022 Chronic systolic (congestive) heart failure: Secondary | ICD-10-CM | POA: Diagnosis not present

## 2022-06-02 DIAGNOSIS — I97618 Postprocedural hemorrhage and hematoma of a circulatory system organ or structure following other circulatory system procedure: Secondary | ICD-10-CM | POA: Diagnosis not present

## 2022-06-02 DIAGNOSIS — Z7985 Long-term (current) use of injectable non-insulin antidiabetic drugs: Secondary | ICD-10-CM

## 2022-06-02 DIAGNOSIS — I5042 Chronic combined systolic (congestive) and diastolic (congestive) heart failure: Secondary | ICD-10-CM | POA: Diagnosis present

## 2022-06-02 DIAGNOSIS — E876 Hypokalemia: Secondary | ICD-10-CM | POA: Diagnosis not present

## 2022-06-02 DIAGNOSIS — I739 Peripheral vascular disease, unspecified: Secondary | ICD-10-CM | POA: Diagnosis present

## 2022-06-02 LAB — CBC
HCT: 31.1 % — ABNORMAL LOW (ref 39.0–52.0)
Hemoglobin: 10.3 g/dL — ABNORMAL LOW (ref 13.0–17.0)
MCH: 29.1 pg (ref 26.0–34.0)
MCHC: 33.1 g/dL (ref 30.0–36.0)
MCV: 87.9 fL (ref 80.0–100.0)
Platelets: 316 10*3/uL (ref 150–400)
RBC: 3.54 MIL/uL — ABNORMAL LOW (ref 4.22–5.81)
RDW: 13.5 % (ref 11.5–15.5)
WBC: 16.3 10*3/uL — ABNORMAL HIGH (ref 4.0–10.5)
nRBC: 0 % (ref 0.0–0.2)

## 2022-06-02 LAB — COMPREHENSIVE METABOLIC PANEL
ALT: 73 U/L — ABNORMAL HIGH (ref 0–44)
AST: 78 U/L — ABNORMAL HIGH (ref 15–41)
Albumin: 3.2 g/dL — ABNORMAL LOW (ref 3.5–5.0)
Alkaline Phosphatase: 63 U/L (ref 38–126)
Anion gap: 11 (ref 5–15)
BUN: 58 mg/dL — ABNORMAL HIGH (ref 6–20)
CO2: 25 mmol/L (ref 22–32)
Calcium: 8.6 mg/dL — ABNORMAL LOW (ref 8.9–10.3)
Chloride: 98 mmol/L (ref 98–111)
Creatinine, Ser: 2.42 mg/dL — ABNORMAL HIGH (ref 0.61–1.24)
GFR, Estimated: 30 mL/min — ABNORMAL LOW (ref >60)
Glucose, Bld: 208 mg/dL — ABNORMAL HIGH (ref 70–99)
Potassium: 3.9 mmol/L (ref 3.5–5.1)
Sodium: 134 mmol/L — ABNORMAL LOW (ref 135–145)
Total Bilirubin: 0.6 mg/dL (ref 0.3–1.2)
Total Protein: 7.7 g/dL (ref 6.5–8.1)

## 2022-06-02 LAB — GLUCOSE, CAPILLARY
Glucose-Capillary: 129 mg/dL — ABNORMAL HIGH (ref 70–99)
Glucose-Capillary: 128 mg/dL — ABNORMAL HIGH (ref 70–99)

## 2022-06-02 LAB — PREALBUMIN: Prealbumin: 14 mg/dL — ABNORMAL LOW (ref 18–38)

## 2022-06-02 LAB — SEDIMENTATION RATE: Sed Rate: 127 mm/hr — ABNORMAL HIGH (ref 0–20)

## 2022-06-02 LAB — CBG MONITORING, ED: Glucose-Capillary: 155 mg/dL — ABNORMAL HIGH (ref 70–99)

## 2022-06-02 LAB — C-REACTIVE PROTEIN: CRP: 17.4 mg/dL — ABNORMAL HIGH (ref <1.0)

## 2022-06-02 MED ORDER — VANCOMYCIN HCL 2000 MG/400ML IV SOLN
2000.0000 mg | Freq: Once | INTRAVENOUS | Status: DC
  Filled 2022-06-02: qty 400

## 2022-06-02 MED ORDER — ONDANSETRON HCL 4 MG/2ML IJ SOLN: 4.0000 mg | Freq: Four times a day (QID) | INTRAMUSCULAR | Status: DC | PRN

## 2022-06-02 MED ORDER — ASPIRIN 81 MG PO TBEC
81.0000 mg | DELAYED_RELEASE_TABLET | Freq: Every day | ORAL | Status: DC
  Administered 2022-06-03 – 2022-06-09 (×5): 81 mg via ORAL
  Filled 2022-06-02 (×6): qty 1

## 2022-06-02 MED ORDER — SODIUM CHLORIDE 0.9% FLUSH
3.0000 mL | Freq: Two times a day (BID) | INTRAVENOUS | Status: DC
  Administered 2022-06-02 – 2022-06-09 (×11): 3 mL via INTRAVENOUS

## 2022-06-02 MED ORDER — ENOXAPARIN SODIUM 60 MG/0.6ML IJ SOSY
0.5000 mg/kg | PREFILLED_SYRINGE | INTRAMUSCULAR | Status: DC
  Administered 2022-06-02 – 2022-06-08 (×7): 47.5 mg via SUBCUTANEOUS
  Filled 2022-06-02 (×7): qty 0.6

## 2022-06-02 MED ORDER — B COMPLEX-C PO TABS
1.0000 | ORAL_TABLET | Freq: Every day | ORAL | Status: DC
  Administered 2022-06-02 – 2022-06-09 (×7): 1 via ORAL
  Filled 2022-06-02 (×10): qty 1

## 2022-06-02 MED ORDER — B COMPLEX PO TABS: 1.0000 | ORAL_TABLET | Freq: Every day | ORAL | Status: DC

## 2022-06-02 MED ORDER — TORSEMIDE 20 MG PO TABS
40.0000 mg | ORAL_TABLET | Freq: Every day | ORAL | Status: DC
  Administered 2022-06-03: 40 mg via ORAL
  Filled 2022-06-02: qty 2

## 2022-06-02 MED ORDER — VANCOMYCIN HCL 1500 MG/300ML IV SOLN: 1500.0000 mg | INTRAVENOUS | Status: DC

## 2022-06-02 MED ORDER — ACETAMINOPHEN 650 MG RE SUPP: 650.0000 mg | Freq: Four times a day (QID) | RECTAL | Status: DC | PRN

## 2022-06-02 MED ORDER — SODIUM CHLORIDE 0.9 % IV BOLUS
1000.0000 mL | Freq: Once | INTRAVENOUS | Status: AC
  Administered 2022-06-02: 1000 mL via INTRAVENOUS

## 2022-06-02 MED ORDER — SODIUM CHLORIDE 0.9 % IV SOLN
2.0000 g | INTRAVENOUS | Status: DC
  Administered 2022-06-02 – 2022-06-05 (×4): 2 g via INTRAVENOUS
  Filled 2022-06-02 (×4): qty 20

## 2022-06-02 MED ORDER — GABAPENTIN 100 MG PO CAPS
100.0000 mg | ORAL_CAPSULE | Freq: Two times a day (BID) | ORAL | Status: DC
  Administered 2022-06-02 – 2022-06-09 (×13): 100 mg via ORAL
  Filled 2022-06-02 (×13): qty 1

## 2022-06-02 MED ORDER — ONDANSETRON HCL 4 MG PO TABS: 4.0000 mg | ORAL_TABLET | Freq: Four times a day (QID) | ORAL | Status: DC | PRN

## 2022-06-02 MED ORDER — METRONIDAZOLE 500 MG PO TABS
500.0000 mg | ORAL_TABLET | Freq: Three times a day (TID) | ORAL | Status: DC
  Administered 2022-06-02 – 2022-06-05 (×9): 500 mg via ORAL
  Filled 2022-06-02 (×9): qty 1

## 2022-06-02 MED ORDER — PRAMIPEXOLE DIHYDROCHLORIDE 1 MG PO TABS
1.5000 mg | ORAL_TABLET | Freq: Two times a day (BID) | ORAL | Status: DC
  Administered 2022-06-02 – 2022-06-05 (×7): 1.5 mg via ORAL
  Filled 2022-06-02 (×8): qty 2

## 2022-06-02 MED ORDER — OMEGA-3-ACID ETHYL ESTERS 1 G PO CAPS
1000.0000 mg | ORAL_CAPSULE | Freq: Two times a day (BID) | ORAL | Status: DC
  Administered 2022-06-02 – 2022-06-09 (×13): 1000 mg via ORAL
  Filled 2022-06-02 (×13): qty 1

## 2022-06-02 MED ORDER — ACETAMINOPHEN 325 MG PO TABS
650.0000 mg | ORAL_TABLET | Freq: Four times a day (QID) | ORAL | Status: DC | PRN
  Administered 2022-06-06 – 2022-06-09 (×2): 650 mg via ORAL
  Filled 2022-06-02 (×2): qty 2

## 2022-06-02 MED ORDER — VANCOMYCIN HCL IN DEXTROSE 1-5 GM/200ML-% IV SOLN
1000.0000 mg | Freq: Once | INTRAVENOUS | Status: AC
  Administered 2022-06-02: 1000 mg via INTRAVENOUS
  Filled 2022-06-02: qty 200

## 2022-06-02 MED ORDER — SODIUM CHLORIDE 0.9% FLUSH: 3.0000 mL | INTRAVENOUS | Status: DC | PRN

## 2022-06-02 MED ORDER — METOLAZONE 2.5 MG PO TABS
2.5000 mg | ORAL_TABLET | Freq: Every day | ORAL | Status: DC
  Administered 2022-06-03: 2.5 mg via ORAL
  Filled 2022-06-02 (×2): qty 1

## 2022-06-02 MED ORDER — SODIUM CHLORIDE 0.9 % IV SOLN: 250.0000 mL | INTRAVENOUS | Status: DC | PRN

## 2022-06-02 MED ORDER — INSULIN DETEMIR 100 UNIT/ML ~~LOC~~ SOLN
24.0000 [IU] | Freq: Every day | SUBCUTANEOUS | Status: DC
  Administered 2022-06-02 – 2022-06-07 (×5): 24 [IU] via SUBCUTANEOUS
  Filled 2022-06-02 (×7): qty 0.24

## 2022-06-02 MED ORDER — ASPIRIN 81 MG PO TBEC: 81.0000 mg | DELAYED_RELEASE_TABLET | Freq: Every day | ORAL | Status: DC

## 2022-06-02 MED ORDER — INSULIN ASPART 100 UNIT/ML IJ SOLN
0.0000 [IU] | Freq: Three times a day (TID) | INTRAMUSCULAR | Status: DC
  Administered 2022-06-02: 2 [IU] via SUBCUTANEOUS
  Administered 2022-06-02 – 2022-06-03 (×2): 3 [IU] via SUBCUTANEOUS
  Administered 2022-06-03 – 2022-06-04 (×2): 2 [IU] via SUBCUTANEOUS
  Administered 2022-06-04: 5 [IU] via SUBCUTANEOUS
  Administered 2022-06-04: 3 [IU] via SUBCUTANEOUS
  Administered 2022-06-05: 2 [IU] via SUBCUTANEOUS
  Administered 2022-06-06 (×2): 3 [IU] via SUBCUTANEOUS
  Administered 2022-06-06: 15 [IU] via SUBCUTANEOUS
  Administered 2022-06-07: 5 [IU] via SUBCUTANEOUS
  Administered 2022-06-07: 2 [IU] via SUBCUTANEOUS
  Administered 2022-06-07: 8 [IU] via SUBCUTANEOUS
  Administered 2022-06-08: 3 [IU] via SUBCUTANEOUS
  Administered 2022-06-08: 5 [IU] via SUBCUTANEOUS
  Administered 2022-06-09: 3 [IU] via SUBCUTANEOUS
  Administered 2022-06-09: 5 [IU] via SUBCUTANEOUS
  Filled 2022-06-02 (×17): qty 1

## 2022-06-02 MED ORDER — LOSARTAN POTASSIUM 25 MG PO TABS
25.0000 mg | ORAL_TABLET | Freq: Every day | ORAL | Status: DC
  Administered 2022-06-03: 25 mg via ORAL
  Filled 2022-06-02: qty 1

## 2022-06-02 MED ORDER — ROSUVASTATIN CALCIUM 10 MG PO TABS
20.0000 mg | ORAL_TABLET | Freq: Every day | ORAL | Status: DC
  Administered 2022-06-03 – 2022-06-09 (×6): 20 mg via ORAL
  Filled 2022-06-02 (×6): qty 2

## 2022-06-02 MED ORDER — AMIODARONE HCL 200 MG PO TABS
200.0000 mg | ORAL_TABLET | Freq: Every day | ORAL | Status: DC
  Administered 2022-06-03 – 2022-06-09 (×7): 200 mg via ORAL
  Filled 2022-06-02 (×7): qty 1

## 2022-06-02 MED ORDER — METOPROLOL SUCCINATE ER 50 MG PO TB24
50.0000 mg | ORAL_TABLET | Freq: Every day | ORAL | Status: DC
  Administered 2022-06-03 – 2022-06-09 (×7): 50 mg via ORAL
  Filled 2022-06-02 (×7): qty 1

## 2022-06-02 NOTE — Consult Note (Signed)
Pharmacy Antibiotic Note  Ricardo Brown is a 61 y.o. male admitted on 06/02/2022 with  left leg pain concerning for diabetic foot infection .  Pharmacy has been consulted for Vancomycin dosing.  10/31 - Patient currently in AKI but slow-improving (SCR 2.77-->2.42, baseline in the 1.5s). Will continue to monitor and adjust dosing as appropriate.  Plan: Vancomycin loading dose: received 1g x1 in ED; Gave additional 1g IV x1 to complete loading dose. Followed by vancomycin 1500 mg IV Q 48hrs (starting 11/2 1300).  Goal AUC 400-550. Expected AUC: 515 Expected Cmin: 9.9 SCr used: 2.42; IBW; Vd 0.5 (BMI 32)  Pt also receiving  Ceftriaxone 2g IV q24h (10/31-11/7) Metronidazole '500mg'$  IV q8h (10/31-11/7)   Height: '5\' 8"'$  (172.7 cm) Weight: 95.5 kg (210 lb 8.6 oz) IBW/kg (Calculated) : 68.4  Temp (24hrs), Avg:98 F (36.7 C), Min:98 F (36.7 C), Max:98 F (36.7 C)  Recent Labs  Lab 05/27/22 2013 05/27/22 2014 05/27/22 2150 05/28/22 0151 05/28/22 2355 05/29/22 0053 05/29/22 0527 05/30/22 0522 06/02/22 1005  WBC 15.9*  --   --  14.1*  --   --  8.8  --  16.3*  CREATININE 2.77*  --   --  2.51*  --  2.06* 1.90* 1.59* 2.42*  LATICACIDVEN  --  2.3* 1.7  --   --   --   --   --   --   VANCORANDOM  --   --   --   --  8  --   --   --   --      Estimated Creatinine Clearance: 36.4 mL/min (A) (by C-G formula based on SCr of 2.42 mg/dL (H)).    Allergies  Allergen Reactions   Lipitor [Atorvastatin] Palpitations    SVT   Sacubitril-Valsartan Other (See Comments)    SVT Other reaction(s): Other (see comments) Other reaction(s): Other (See Comments) SVT SVT   Pregabalin Palpitations    SVT Other reaction(s): Other (see comments) SVT SVT    Antimicrobials this admission: + Vancomycin (10/25 >> 10/28) (10/31>> Cefepime 10/25 >> 10/31; then +Ceftriaxone (10/31-11/07) +Metronidazole (10/25 >> 11/07)  Dose adjustments this admission: CTM and adjust ISO  AKI.  Microbiology results: 10/25 BCx: NGTD 10/31 Bcx: NGTD 10/31 WoundCx: NGTD   Thank you for allowing pharmacy to be a part of this patient's care.  Shanon Brow Aldan Camey 06/02/2022 12:08 PM

## 2022-06-02 NOTE — ED Notes (Signed)
Pt being admitted to room 1A-156, report given to [place], [facility] at this time, belongings sent with pt. Pt being transported via wheelchair by ED tech.

## 2022-06-02 NOTE — Assessment & Plan Note (Signed)
Patient has a history of diabetes mellitus with complications of stage IIIb chronic kidney disease Maintain consistent carbohydrate diet Glycemic control with sliding scale insulin as well as long-acting insulin Blood sugars before meals and at bedtime

## 2022-06-02 NOTE — ED Triage Notes (Addendum)
Pt sent here from podiatrist for wound between left toe 1 and 2. Pt diabetic, hx foot issues.

## 2022-06-02 NOTE — Assessment & Plan Note (Signed)
Patient admitted to the hospital for worsening diabetic foot infection involving the left foot patient has an ulcer on the plantar surface of the left foot and surrounding cellulitis over the dorsum of the left foot. He has a white count of 16,000 but is afebrile We will place patient empirically on Rocephin, metronidazole and IV vancomycin

## 2022-06-02 NOTE — Assessment & Plan Note (Signed)
History of coronary artery disease status post CABG Continue aspirin, metoprolol and Crestor

## 2022-06-02 NOTE — Assessment & Plan Note (Signed)
We will request vascular surgery consult during this hospitalization for worsening left diabetic foot ulceration and cellulitis

## 2022-06-02 NOTE — Assessment & Plan Note (Signed)
Stable and not acutely exacerbated Last known LVEF of 45 to 50% from a 2D echocardiogram which was done in 05/23 Continue metoprolol, losartan, metolazone and torsemide Maintain low-sodium diet

## 2022-06-02 NOTE — Assessment & Plan Note (Signed)
BMI 32 Complicates overall prognosis and care

## 2022-06-02 NOTE — ED Provider Notes (Signed)
[facility] Provider Note    Event Date/Time   First MD Initiated Contact with Patient 06/02/22 [phone]     (approximate)  History   Chief Complaint: Foot Pain  HPI  Ricardo Brown is a 61 y.o. male with a past medical history of CHF, CKD, diabetes, hypertension, hyperlipidemia, presents to the emergency department for left foot infection.  Patient was just discharged from the hospital according to chart review discharge summary from 3 days ago shows admission for sepsis and worsening renal function.  Patient followed up with his podiatrist yesterday.  Was told to come back to the emergency department for readmission for further IV antibiotics.  Patient came back this morning.  Patient denies any fever at home.  States severe neuropathy in his foot does not have any feeling.  Physical Exam   Triage Vital Signs: ED Triage Vitals  Enc Vitals Group     BP 06/02/22 0901 111/61     Pulse Rate 06/02/22 0901 78     Resp 06/02/22 0901 18     Temp 06/02/22 0901 98 F (36.7 C)     Temp Source 06/02/22 0901 Oral     SpO2 06/02/22 0901 97 %     Weight --      Height --      Head Circumference --      Peak Flow --      Pain Score 06/02/22 0909 0     Pain Loc --      Pain Edu? --      Excl. in [place]? --     Most recent vital signs: Vitals:   06/02/22 0901  BP: 111/61  Pulse: 78  Resp: 18  Temp: 98 F (36.7 C)  SpO2: 97%    General: Awake, no distress.  CV:  Good peripheral perfusion.  Regular rate and rhythm  Resp:  Normal effort.  Equal breath sounds bilaterally.  Abd:  No distention.  Soft, nontender.  No rebound or guarding. Other:  Patient has moderate erythema of the left foot starting from halfway and extending distally including the first second and third toes with what appears to be an ulceration to the base of the first toe with drainage.  ED Results / Procedures / Treatments    MEDICATIONS ORDERED IN ED: Medications  vancomycin  (VANCOCIN) IVPB 1000 mg/200 mL premix (has no administration in time range)     IMPRESSION / MDM / ASSESSMENT AND PLAN / ED COURSE  I reviewed the triage vital signs and the nursing notes.  Patient's presentation is most consistent with acute presentation with potential threat to life or bodily function.  Patient presents emergency department for worsening redness and swelling now with drainage to the left foot.  Patient was followed by his podiatrist Dr. Luana Shu who recommended the patient come back to the emergency department for IV antibiotics and admission to the hospital as the patient may need vascular intervention or possible amputation.  Patient denies any pain as he has significant neuropathy.  Exam is consistent with cellulitis in the first second and possibly third toes extending approximately halfway up the foot warm to the touch.  We will check labs, blood cultures start IV vancomycin obtain an x-ray and plan to admit to the hospital service for further work-up and treatment.  Patient agreeable to plan.  I have reviewed and interpreted the foot x-ray images no signs of gas on my evaluation. Radiology has read the x-rays negative for  osteomyelitis.  Patient's labs have resulted showing moderate leukocytosis of 16,000, acute on chronic renal insufficiency.  Patient receiving IV fluids, receiving IV vancomycin.  We will admit to the hospital service for further work-up and treatment.  FINAL CLINICAL IMPRESSION(S) / ED DIAGNOSES   Cellulitis, left foot   Note:  This document was prepared using Dragon voice recognition software and may include unintentional dictation errors.   Harvest Dark, MD 06/02/22 1057

## 2022-06-02 NOTE — H&P (Signed)
History and Physical    Patient: Ricardo Brown DOB: 07/13/1961 DOA: 06/02/2022 DOS: the patient was seen and examined on 06/02/2022 PCP: [name], MD  Patient coming from: Home  Chief Complaint:  Chief Complaint  Patient presents with   Foot Pain   HPI: Ricardo Brown is a 61 y.o. male with medical history significant for diabetes mellitus with complications of stage 3b CKD neuropathy, history of coronary artery disease status post CABG, history of chronic combined systolic and diastolic dysfunction CHF, hypertension who was recently discharged from the hospital on 05/30/1930 for cellulitis and diabetic foot infection involving the left foot. Patient went to see his podiatrist 1 day prior to his admission and was noted to have findings concerning for early gangrene involving his left foot and was advised to return to the ER for further evaluation. He denies having any fever or chills and denies having any pain in his left foot secondary to neuropathy but has increased redness over the left foot extending from his left great and second toe onto the dorsum of his left foot. He denies having any chest pain, no shortness of breath, no nausea, no vomiting, no abdominal pain, no headache, no blurred vision, no dizziness, no lightheadedness, no focal deficit. Labs show a white count of 16.3 History of the left foot showed no cortical erosion to indicate radiographic evidence of osteomyelitis.Mild hallux valgus and great toe metatarsophalangeal joint osteoarthritis. Patient received a dose of IV vancomycin in the ER and will be admitted to the hospital for further evaluation.  Review of Systems: As mentioned in the history of present illness. All other systems reviewed and are negative. Past Medical History:  Diagnosis Date   Chronic combined systolic (congestive) and diastolic (congestive) heart failure ([place])    a. 01/2018 EF 35-40%; b. 10/2020 Echo: EF  40-45%, glob HK. RVSP 42.73mHg. Mildly dil LA. Mild MS/AoV sclerosis.   CKD (chronic kidney disease), stage Brown (HCC)    Coronary artery disease    a. 01/2018 late presenting inferior MI; b. 01/2018 Cath: Severe multivessel dzs-->CABG x 3 (LIMA->LAD, VG->OM2, VG->RPDA @ [facility]).   Diabetes mellitus without complication ([place]    a. Dx ~ 2000.   Heart palpitations    a. Pt reports prior nl echo's and stress tests. Last stress test w/in past 2 yrs - PCP.   High cholesterol    Hypertension    Mitral regurgitation    a. 01/2018 s/p MV repair @ time of CABG.   Myocardial infarction ([facility]    Postoperative atrial fibrillation    a. 01/2018 @ time of CABG.   PSVT (paroxysmal supraventricular tachycardia)    a. Very symptomatic with multiple ED evaluations.  Has been on amio.   Severe sepsis with acute organ dysfunction ([place] 12/19/2021   Sleep difficulties 09/07/2019   STEMI (ST elevation myocardial infarction) ([place] 01/24/2018   Past Surgical History:  Procedure Laterality Date   AMPUTATION TOE Right 08/29/2021   Procedure: AMPUTATION TOE-2nd toe;  Surgeon: CSharlotte Alamo DPM;  Location: [facility];  Service: Podiatry;  Laterality: Right;   AMPUTATION TOE Left 12/22/2021   Procedure: AMPUTATION TOE-2nd Toe Partial;  Surgeon: BCaroline More DPM;  Location: [facility];  Service: Podiatry;  Laterality: Left;   CARDIAC CATHETERIZATION     CARDIAC VALVE REPLACEMENT     Mitral Valve Repair   COLONOSCOPY WITH PROPOFOL N/A 06/07/2019   Procedure: COLONOSCOPY WITH PROPOFOL;  Surgeon: TVirgel Manifold MD;  Location: [facility];  Service: Endoscopy;  Laterality: N/A;   COLONOSCOPY WITH PROPOFOL N/A 02/07/2020   Procedure: COLONOSCOPY WITH PROPOFOL;  Surgeon: [name], MD;  Location: [facility];  Service: Endoscopy;  Laterality: N/A;   COLONOSCOPY WITH PROPOFOL N/A 12/11/2020   Procedure: COLONOSCOPY WITH PROPOFOL;  Surgeon: [name], MD;  Location: [facility];  Service: Endoscopy;   Laterality: N/A;   CORONARY ANGIOPLASTY     CORONARY ARTERY BYPASS GRAFT     x 3   01/2018   ESOPHAGOGASTRODUODENOSCOPY N/A 02/07/2020   Procedure: ESOPHAGOGASTRODUODENOSCOPY (EGD);  Surgeon: [name], MD;  Location: [facility]COPY;  Service: Endoscopy;  Laterality: N/A;   LEFT HEART CATH AND CORONARY ANGIOGRAPHY N/A 01/24/2018   Procedure: LEFT HEART CATH AND CORONARY ANGIOGRAPHY;  Surgeon: [name], MD;  Location: [place] [facility];  Service: Cardiovascular;  Laterality: N/A;   SVT ABLATION N/A 09/12/2021   Procedure: SVT ABLATION;  Surgeon: Vickie Epley, MD;  Location: [place] [facility];  Service: Cardiovascular;  Laterality: N/A;   TONSILLECTOMY     Social History:  reports that he has never smoked. He has never used smokeless tobacco. He reports that he does not currently use alcohol. He reports that he does not use drugs.  Allergies  Allergen Reactions   Lipitor [Atorvastatin] Palpitations    SVT   Sacubitril-Valsartan Other (See Comments)    SVT Other reaction(s): Other (see comments) Other reaction(s): Other (See Comments) SVT SVT   Pregabalin Palpitations    SVT Other reaction(s): Other (see comments) SVT SVT    Family History  Problem Relation Age of Onset   Cancer Mother        died @ 30   CAD Father        First MI @ 51. S/p heart transplant. Died in mid-50's of cancer.   Cancer Father    Other Brother        alive and well    Prior to Admission medications   Medication Sig Start Date End Date Taking? Authorizing Provider  amiodarone (PACERONE) 200 MG tablet Take 200 mg by mouth daily.   Yes [provider]  aspirin 81 MG EC tablet Take 81 mg by mouth daily.    Yes [provider]  b complex vitamins tablet Take 1 tablet by mouth daily.    Yes [provider]  doxycycline (ADOXA) 100 MG tablet Take 1 tablet (100 mg total) by mouth 2 (two) times daily for 4 days. 05/30/22 06/03/22 Yes Annita Brod, MD   empagliflozin (JARDIANCE) 25 MG TABS tablet Take 25 mg by mouth daily.   Yes [provider]  gabapentin (NEURONTIN) 100 MG capsule Take 1 capsule (100 mg total) by mouth 2 (two) times daily. 11/05/21  Yes Masoud, Viann Shove, MD  insulin degludec (TRESIBA) 200 UNIT/ML FlexTouch Pen 32 Units at bedtime.   Yes [provider]  losartan (COZAAR) 25 MG tablet Take 25 mg by mouth daily. 03/22/22  Yes [provider]  metFORMIN (GLUCOPHAGE) 500 MG tablet Take 2 tablets (1,000 mg total) by mouth 2 (two) times daily. 10/23/21  Yes Theresia Lo, NP  metolazone (ZAROXOLYN) 2.5 MG tablet Take 1 tablet (2.5 mg total) by mouth daily. 05/11/22  Yes Masoud, Viann Shove, MD  metoprolol succinate (TOPROL-XL) 50 MG 24 hr tablet Take 1 tablet (50 mg total) by mouth daily. Take with or immediately following a meal. 05/27/21  Yes Masoud, Viann Shove, MD  Omega-3 Fatty Acids (FISH OIL) 875 MG CAPS  Take 875 mg by mouth 2 (two) times daily.   Yes [provider]  pramipexole (MIRAPEX) 1.5 MG tablet Take 1 tablet (1.5 mg total) by mouth 2 (two) times daily. 05/21/21  Yes Masoud, Viann Shove, MD  rosuvastatin (CRESTOR) 20 MG tablet Take 1 tablet (20 mg total) by mouth daily. 04/01/22 06/30/22 Yes Agbor-Etang, Aaron Edelman, MD  torsemide (DEMADEX) 20 MG tablet Take 2 tablets (40 mg total) by mouth daily. 40 mg daily 07/14/21  Yes Agbor-Etang, Aaron Edelman, MD  Dulaglutide 1.5 MG/0.5ML SOPN Inject 1.5 mg into the skin every Saturday.    [provider]  levofloxacin (LEVAQUIN) 750 MG tablet Take 1 tablet (750 mg total) by mouth every other day for 2 doses. 05/31/22 06/03/22  Annita Brod, MD    Physical Exam: Vitals:   06/02/22 0901 06/02/22 1117  BP: 111/61   Pulse: 78   Resp: 18   Temp: 98 F (36.7 C)   TempSrc: Oral   SpO2: 97%   Weight:  95.5 kg  Height:  '5\' 8"'$  (1.727 m)   Physical Exam Vitals and nursing note reviewed.  Constitutional:      Appearance: He is obese.  HENT:     Head:  Normocephalic and atraumatic.     Nose: Nose normal.     Mouth/Throat:     Mouth: Mucous membranes are moist.  Eyes:     Comments: Pale conjunctiva  Cardiovascular:     Rate and Rhythm: Normal rate and regular rhythm.  Pulmonary:     Effort: Pulmonary effort is normal.     Breath sounds: Normal breath sounds.  Abdominal:     General: Bowel sounds are normal.     Palpations: Abdomen is soft.     Comments: Central adiposity  Musculoskeletal:     Cervical back: Normal range of motion and neck supple.     Comments: Redness extending from the left great and second toe and involving the dorsum of the left foot with differential warmth and ulceration on the plantar surface of the left foot underneath the left great toe  Skin:    General: Skin is warm and dry.  Neurological:     General: No focal deficit present.     Mental Status: He is alert.  Psychiatric:        Mood and Affect: Mood normal.        Behavior: Behavior normal.     Data Reviewed: Relevant notes from primary care and specialist visits, past discharge summaries as available in EHR, including Care Everywhere. Prior diagnostic testing as pertinent to current admission diagnoses Updated medications and problem lists for reconciliation ED course, including vitals, labs, imaging, treatment and response to treatment Triage notes, nursing and pharmacy notes and ED provider's notes Notable results as noted in HPI Labs reviewed.  Sodium 134, potassium 3.9, chloride 98, bicarb 25, glucose 2 8, BUN 58, creatinine 2.42, calcium 8.6, total protein 7.7, albumin 3.2, AST 78, ALT 73, alkaline phosphatase 63, total bilirubin 0.6, white count 16.3, hemoglobin 10.3, hematocrit 31.1, platelet count 360 There are no new results to review at this time.  Assessment and Plan: * Diabetic foot infection ([place]) Patient admitted to the hospital for worsening diabetic foot infection involving the left foot patient has an ulcer on the plantar  surface of the left foot and surrounding cellulitis over the dorsum of the left foot. He has a white count of 16,000 but is afebrile We will place patient empirically on Rocephin, metronidazole and IV  vancomycin  Chronic combined systolic (congestive) and diastolic (congestive) heart failure (HCC) Stable and not acutely exacerbated Last known LVEF of 45 to 50% from a 2D echocardiogram which was done in 05/23 Continue metoprolol, losartan, metolazone and torsemide Maintain low-sodium diet   Type 2 diabetes mellitus with diabetic chronic kidney disease ([place]) Patient has a history of diabetes mellitus with complications of stage IIIb chronic kidney disease Maintain consistent carbohydrate diet Glycemic control with sliding scale insulin as well as long-acting insulin Blood sugars before meals and at bedtime  PAD (peripheral artery disease) ([place]) We will request vascular surgery consult during this hospitalization for worsening left diabetic foot ulceration and cellulitis  Obesity (BMI 30-39.9) BMI 32 Complicates overall prognosis and care  S/P CABG x 3 History of coronary artery disease status post CABG Continue aspirin, metoprolol and Crestor      Advance Care Planning:   Code Status: Full Code   Consults: Podiatry, vascular surgery  Family Communication: Greater than 50% of time was spent discussing patient's condition and plan of care with him at the bedside.  All questions and concerns have been addressed.  He verbalizes understanding and agrees with the plan.  Severity of Illness: The appropriate patient status for this patient is INPATIENT. Inpatient status is judged to be reasonable and necessary in order to provide the required intensity of service to ensure the patient's safety. The patient's presenting symptoms, physical exam findings, and initial radiographic and laboratory data in the context of their chronic comorbidities is felt to place them at high risk for further  clinical deterioration. Furthermore, it is not anticipated that the patient will be medically stable for discharge from the hospital within 2 midnights of admission.   * I certify that at the point of admission it is my clinical judgment that the patient will require inpatient hospital care spanning beyond 2 midnights from the point of admission due to high intensity of service, high risk for further deterioration and high frequency of surveillance required.*  Author: Collier Bullock, MD 06/02/2022 12:00 PM  For on call review [email or web address].

## 2022-06-02 NOTE — Consult Note (Signed)
PODIATRY / FOOT AND ANKLE SURGERY CONSULTATION NOTE  Requesting Physician: Dr. Francine Graven  Reason for consult: Left foot infection/wound  Chief Complaint: Left foot wound/infection   HPI: Ricardo Brown is a 61 y.o. male who presents with a nonhealing ulceration to the left hallux first webspace as well as plantar first metatarsal phalangeal joint.  Patient went to the emergency room last week after he was seen by myself and was noted to have cellulitis to the left forefoot.  Patient was treated with IV antibiotics and sent home after further work-up was performed as x-ray imaging and MRI imaging did not show any abscess or osteomyelitis.  Patient was seen in the outpatient clinic this week and was noted to have worsening necrosis of the wound with capillary fill time delayed to the toe and some darkened/discolored skin between the first interspace concerning for early stages of gangrene.  Patient also had dependent rubor that was improved with elevation to the left forefoot.  Patient currently seen in the hospital and denies nausea, vomiting, fevers, chills.  PMHx:  Past Medical History:  Diagnosis Date   Chronic combined systolic (congestive) and diastolic (congestive) heart failure ([place])    a. 01/2018 EF 35-40%; b. 10/2020 Echo: EF 40-45%, glob HK. RVSP 42.68mHg. Mildly dil LA. Mild MS/AoV sclerosis.   CKD (chronic kidney disease), stage Brown (HCC)    Coronary artery disease    a. 01/2018 late presenting inferior MI; b. 01/2018 Cath: Severe multivessel dzs-->CABG x 3 (LIMA->LAD, VG->OM2, VG->RPDA @ [facility]).   Diabetes [place]    a. Dx ~ 2000.   Heart palpitations    a. Pt reports prior nl echo's and stress tests. Last stress test w/in past 2 yrs - PCP.   High cholesterol    Hypertension    Mitral regurgitation    a. 01/2018 s/p MV repair @ time of CABG.   Myocardial infarction ([facility]    Postoperative atrial fibrillation    a. 01/2018 @ time of CABG.   PSVT  (paroxysmal supraventricular tachycardia)    a. Very symptomatic with multiple ED evaluations.  Has been on amio.   Severe sepsis with acute organ dysfunction ([place] 12/19/2021   Sleep difficulties 09/07/2019   STEMI (ST elevation myocardial infarction) ([place] 01/24/2018    Surgical Hx:  Past Surgical History:  Procedure Laterality Date   AMPUTATION TOE Right 08/29/2021   Procedure: AMPUTATION TOE-2nd toe;  Surgeon: CSharlotte Alamo DPM;  Location: [facility];  Service: Podiatry;  Laterality: Right;   AMPUTATION TOE Left 12/22/2021   Procedure: AMPUTATION TOE-2nd Toe Partial;  Surgeon: BCaroline More DPM;  Location: [facility];  Service: Podiatry;  Laterality: Left;   CARDIAC CATHETERIZATION     CARDIAC VALVE REPLACEMENT     Mitral Valve Repair   COLONOSCOPY WITH PROPOFOL N/A 06/07/2019   Procedure: COLONOSCOPY WITH PROPOFOL;  Surgeon: TVirgel Manifold MD;  Location: [facility] END[facility];  Service: Endoscopy;  Laterality: N/A;   COLONOSCOPY WITH PROPOFOL N/A 02/07/2020   Procedure: COLONOSCOPY WITH PROPOFOL;  Surgeon: TVirgel Manifold MD;  Location: [facility];  Service: Endoscopy;  Laterality: N/A;   COLONOSCOPY WITH PROPOFOL N/A 12/11/2020   Procedure: COLONOSCOPY WITH PROPOFOL;  Surgeon: TVirgel Manifold MD;  Location: [facility];  Service: Endoscopy;  Laterality: N/A;   CORONARY ANGIOPLASTY     CORONARY ARTERY BYPASS GRAFT     x 3   01/2018   ESOPHAGOGASTRODUODENOSCOPY N/A 02/07/2020   Procedure: ESOPHAGOGASTRODUODENOSCOPY (EGD);  Surgeon: TVirgel Manifold  MD;  Location: [facility];  Service: Endoscopy;  Laterality: N/A;   LEFT HEART CATH AND CORONARY ANGIOGRAPHY N/A 01/24/2018   Procedure: LEFT HEART CATH AND CORONARY ANGIOGRAPHY;  Surgeon: [name], MD;  Location: [facility] [facility];  Service: Cardiovascular;  Laterality: N/A;   SVT ABLATION N/A 09/12/2021   Procedure: SVT ABLATION;  Surgeon: Vickie Epley, MD;  Location: [place] [facility];  Service:  Cardiovascular;  Laterality: N/A;   TONSILLECTOMY      FHx:  Family History  Problem Relation Age of Onset   Cancer Mother        died @ 71   CAD Father        First MI @ 86. S/p heart transplant. Died in mid-50's of cancer.   Cancer Father    Other Brother        alive and well    Social History:  reports that he has never smoked. He has never used smokeless tobacco. He reports that he does not currently use alcohol. He reports that he does not use drugs.  Allergies:  Allergies  Allergen Reactions   Lipitor [Atorvastatin] Palpitations    SVT   Sacubitril-Valsartan Other (See Comments)    SVT Other reaction(s): Other (see comments) Other reaction(s): Other (See Comments) SVT SVT   Pregabalin Palpitations    SVT Other reaction(s): Other (see comments) SVT SVT   (Not in a hospital admission)   Physical Exam: General: Alert and oriented.  No apparent distress.  Vascular: DP/PT pulses nonpalpable bilateral, no hair growth noted to digits or foot bilateral.  Erythema and edema present to the left forefoot, dependent rubor that improves with elevation.  Neuro: Light touch sensation absent to bilateral lower extremities.  Derm: Darkened/necrotic skin formation present to the plantar aspect of the first metatarsal phalangeal joint extending to the first interspace, once this area was opened it did reveal a wound that probes very close to bone to the first metatarsal phalangeal joint, no gas or odor present, minimal bleeding.  Concern for worsening necrosis of the left forefoot.  MSK: Partial second toe amputations bilaterally.  Results for orders placed or performed during the hospital encounter of 06/02/22 (from the past 48 hour(s))  CBC     Status: Abnormal   Collection Time: 06/02/22 10:05 AM  Result Value Ref Range   WBC 16.3 (H) 4.0 - 10.5 K/uL   RBC 3.54 (L) 4.22 - 5.81 MIL/uL   Hemoglobin 10.3 (L) 13.0 - 17.0 g/dL   HCT 31.1 (L) 39.0 - 52.0 %   MCV 87.9 80.0 -  100.0 fL   MCH 29.1 26.0 - 34.0 pg   MCHC 33.1 30.0 - 36.0 g/dL   RDW 13.5 11.5 - 15.5 %   Platelets 316 150 - 400 K/uL   nRBC 0.0 0.0 - 0.2 %    Comment: Performed at [facility], [address] [place], [place] [address]  Comprehensive metabolic panel     Status: Abnormal   Collection Time: 06/02/22 10:05 AM  Result Value Ref Range   Sodium 134 (L) 135 - 145 mmol/L   Potassium 3.9 3.5 - 5.1 mmol/L   Chloride 98 98 - 111 mmol/L   CO2 25 22 - 32 mmol/L   Glucose, Bld 208 (H) 70 - 99 mg/dL    Comment: Glucose reference range applies only to samples taken after fasting for at least 8 hours.   BUN 58 (H) 6 - 20 mg/dL   Creatinine,  Ser 2.42 (H) 0.61 - 1.24 mg/dL   Calcium 8.6 (L) 8.9 - 10.3 mg/dL   Total Protein 7.7 6.5 - 8.1 g/dL   Albumin 3.2 (L) 3.5 - 5.0 g/dL   AST 78 (H) 15 - 41 U/L   ALT 73 (H) 0 - 44 U/L   Alkaline Phosphatase 63 38 - 126 U/L   Total Bilirubin 0.6 0.3 - 1.2 mg/dL   GFR, Estimated 30 (L) >60 mL/min    Comment: (NOTE) Calculated using the CKD-EPI Creatinine Equation (2021)    Anion gap 11 5 - 15    Comment: Performed at [facility], [address] [place], [place] [address]  CBG monitoring, ED     Status: Abnormal   Collection Time: 06/02/22 12:24 PM  Result Value Ref Range   Glucose-Capillary 155 (H) 70 - 99 mg/dL    Comment: Glucose reference range applies only to samples taken after fasting for at least 8 hours.   DG Foot Complete Left  Result Date: 06/02/2022 CLINICAL DATA:  Infection. Wound between left first and second toes. Diabetes. EXAM: LEFT FOOT - COMPLETE 3+ VIEW COMPARISON:  Left foot radiographs 12/22/2021 FINDINGS: Redemonstration of amputation of the distal phalanx of the second toe. The resection margins remain sharp. Mild hallux valgus and mild great toe metatarsophalangeal joint space narrowing and peripheral osteophytosis. Small plantar and posterior calcaneal heel spurs. Mild dorsal talonavicular,  navicular-cuneiform, and tarsometatarsal degenerative osteophytes. There is scattered lucency likely related to the reported wound just medial to the great toe proximal phalanx on frontal view. No definite cortical erosion is seen. Moderate atherosclerotic calcifications. IMPRESSION: 1. No cortical erosion to indicate radiographic evidence of osteomyelitis. 2. Mild hallux valgus and great toe metatarsophalangeal joint osteoarthritis. Electronically Signed   By: Yvonne Kendall M.D.   On: 06/02/2022 10:16    Blood pressure 111/61, pulse 78, temperature 98 F (36.7 C), temperature source Oral, resp. rate 18, height _0  (1.727 m), weight 95.5 kg, SpO2 97 %.  Assessment Gangrene left forefoot Possible osteomyelitis left first metatarsal phalangeal joint Diabetes type 2 with polyneuropathy Severe PVD  Plan -Patient seen and examined. -X-ray imaging as well as previous MRI imaging reviewed and discussed with patient in detail.  No evidence of infection.  Appears to have large amount of vascular calcifications. -After discussion was performed with patient patient elected for bedside incision and drainage, verbal consent obtained.  After prep was performed with Betadine performed a incision and drainage to the first webspace area with 15 blade and suture removal kit into the subcutaneous tissue.  No purulence was expressed but there was notable air to the area, this area also probe to bone in this particular area.  The bone did appear to have a hard stop.  Minimal to no bleeding occurred.  This wound in this area appears to probe down to the plantar first metatarsal phalangeal joint area as it appears to be continuous and undermining. -Wound culture was taken and sent off. -Betadine wet-to-dry dressing applied.  Patient have dressing changes daily.  Patient may continue ambulation with heel contact and surgical shoe. -Consult placed with vascular surgery.  Recommend urgent CT angiogram within the next  couple of days as patient appears to have progressively worsened left forefoot ischemia. -Do not recommend any amputation at this time until circulation is improved.  Patient is at high risk for limb loss due to ulceration, infection, diabetes, and severe PVD. -At minimum at this point patient will likely need a partial  first ray amputation but could need transmetatarsal amputation depending on circulation improvement. -We will continue to monitor closely.  Caroline More, DPM 06/02/2022, 1:20 PM

## 2022-06-03 ENCOUNTER — Inpatient Hospital Stay: Payer: Medicare Other

## 2022-06-03 DIAGNOSIS — E669 Obesity, unspecified: Secondary | ICD-10-CM

## 2022-06-03 DIAGNOSIS — L03116 Cellulitis of left lower limb: Secondary | ICD-10-CM | POA: Diagnosis not present

## 2022-06-03 DIAGNOSIS — I10 Essential (primary) hypertension: Secondary | ICD-10-CM

## 2022-06-03 DIAGNOSIS — I5042 Chronic combined systolic (congestive) and diastolic (congestive) heart failure: Secondary | ICD-10-CM

## 2022-06-03 DIAGNOSIS — E11628 Type 2 diabetes mellitus with other skin complications: Secondary | ICD-10-CM | POA: Diagnosis not present

## 2022-06-03 LAB — CBC
HCT: 29 % — ABNORMAL LOW (ref 39.0–52.0)
Hemoglobin: 9.6 g/dL — ABNORMAL LOW (ref 13.0–17.0)
MCH: 29.6 pg (ref 26.0–34.0)
MCHC: 33.1 g/dL (ref 30.0–36.0)
MCV: 89.5 fL (ref 80.0–100.0)
Platelets: 280 10*3/uL (ref 150–400)
RBC: 3.24 MIL/uL — ABNORMAL LOW (ref 4.22–5.81)
RDW: 13.2 % (ref 11.5–15.5)
WBC: 10.3 10*3/uL (ref 4.0–10.5)
nRBC: 0 % (ref 0.0–0.2)

## 2022-06-03 LAB — BASIC METABOLIC PANEL
Anion gap: 8 (ref 5–15)
BUN: 56 mg/dL — ABNORMAL HIGH (ref 6–20)
CO2: 26 mmol/L (ref 22–32)
Calcium: 8.2 mg/dL — ABNORMAL LOW (ref 8.9–10.3)
Chloride: 100 mmol/L (ref 98–111)
Creatinine, Ser: 2.07 mg/dL — ABNORMAL HIGH (ref 0.61–1.24)
GFR, Estimated: 36 mL/min — ABNORMAL LOW (ref >60)
Glucose, Bld: 117 mg/dL — ABNORMAL HIGH (ref 70–99)
Potassium: 3.3 mmol/L — ABNORMAL LOW (ref 3.5–5.1)
Sodium: 134 mmol/L — ABNORMAL LOW (ref 135–145)

## 2022-06-03 LAB — GLUCOSE, CAPILLARY
Glucose-Capillary: 124 mg/dL — ABNORMAL HIGH (ref 70–99)
Glucose-Capillary: 161 mg/dL — ABNORMAL HIGH (ref 70–99)
Glucose-Capillary: 112 mg/dL — ABNORMAL HIGH (ref 70–99)
Glucose-Capillary: 147 mg/dL — ABNORMAL HIGH (ref 70–99)

## 2022-06-03 MED ORDER — VITAMIN C 500 MG PO TABS
500.0000 mg | ORAL_TABLET | Freq: Two times a day (BID) | ORAL | Status: DC
  Administered 2022-06-03 – 2022-06-09 (×11): 500 mg via ORAL
  Filled 2022-06-03 (×11): qty 1

## 2022-06-03 MED ORDER — ENSURE MAX PROTEIN PO LIQD
11.0000 [oz_av] | Freq: Every day | ORAL | Status: DC
  Administered 2022-06-03 – 2022-06-08 (×6): 11 [oz_av] via ORAL
  Filled 2022-06-03: qty 330

## 2022-06-03 MED ORDER — POTASSIUM CHLORIDE CRYS ER 20 MEQ PO TBCR
40.0000 meq | EXTENDED_RELEASE_TABLET | Freq: Once | ORAL | Status: AC
  Administered 2022-06-03: 40 meq via ORAL
  Filled 2022-06-03: qty 2

## 2022-06-03 MED ORDER — ZINC SULFATE 220 (50 ZN) MG PO CAPS
220.0000 mg | ORAL_CAPSULE | Freq: Every day | ORAL | Status: DC
  Administered 2022-06-03 – 2022-06-09 (×6): 220 mg via ORAL
  Filled 2022-06-03 (×6): qty 1

## 2022-06-03 MED ORDER — JUVEN PO PACK
1.0000 | PACK | Freq: Two times a day (BID) | ORAL | Status: DC
  Administered 2022-06-04 – 2022-06-09 (×8): 1 via ORAL

## 2022-06-03 MED ORDER — VANCOMYCIN HCL 1750 MG/350ML IV SOLN
1750.0000 mg | INTRAVENOUS | Status: DC
  Administered 2022-06-04: 1750 mg via INTRAVENOUS
  Filled 2022-06-03: qty 350

## 2022-06-03 MED ORDER — ADULT MULTIVITAMIN W/MINERALS CH
1.0000 | ORAL_TABLET | Freq: Every day | ORAL | Status: DC
  Administered 2022-06-03 – 2022-06-09 (×6): 1 via ORAL
  Filled 2022-06-03 (×6): qty 1

## 2022-06-03 NOTE — Inpatient Diabetes Management (Signed)
Inpatient Diabetes Program Recommendations  AACE/ADA: New Consensus Statement on Inpatient Glycemic Control (2015)  Target Ranges:  Prepandial:   less than 140 mg/dL      Peak postprandial:   less than 180 mg/dL (1-2 hours)      Critically ill patients:  140 - 180 mg/dL   Lab Results  Component Value Date   GLUCAP 124 (H) 06/03/2022   HGBA1C 6.9 (H) 05/27/2022    Review of Glycemic Control  Latest Reference Range & Units 05/30/22 08:08 06/02/22 12:24 06/02/22 16:14 06/02/22 21:23 06/03/22 08:16  Glucose-Capillary 70 - 99 mg/dL 128 (H) 155 (H) 129 (H) 128 (H) 124 (H)  (H): Data is abnormally high  Diabetes history: DM2 Outpatient Diabetes medications:  Tresiba 28 units QD Metformin 1000 mg BID Jardiance 25 mg QD Trulicity every Monday Current orders for Inpatient glycemic control:  Levemir 24 units QD Novolog 0-15 units TID  Inpatient DM consult received.  Spoke with patient at bedside.  He confirms above home medications.  He checks his blood glucose once daily (usually first thing in the morning).  Reviewed current A1C of 6.9%.  Denies difficulties obtaining DM medications or supplies.    Will continue to follow while inpatient.  Thank you, Reche Dixon, MSN, [place] Diabetes Coordinator Inpatient Diabetes Program [phone] (team pager from 8a-5p)

## 2022-06-03 NOTE — Progress Notes (Signed)
Patient off the floor currently for ABI testing.  We will reevaluate patient tomorrow.  Before any surgical intervention can take place on the left foot, patient needs to have circulation improved.  Will likely need some degree of forefoot amputation.  ABIs on the left side appear to show monophasic flow with reduction in ABIs, large amount of arterial calcification on x-ray imaging, patient has obvious signs of forefoot gangrenous changes.  Appreciate vascular recommendations.

## 2022-06-03 NOTE — Progress Notes (Signed)
Initial Nutrition Assessment  DOCUMENTATION CODES:   Obesity unspecified  INTERVENTION:   -MVI with minerals daily -500 mg vitamin C BID -220 mg zinc sulfate daily -1 packet Juven BID, each packet provides 95 calories, 2.5 grams of protein (collagen), and 9.8 grams of carbohydrate (3 grams sugar); also contains 7 grams of L-arginine and L-glutamine, 300 mg vitamin C, 15 mg vitamin E, 1.2 mcg vitamin B-12, 9.5 mg zinc, 200 mg calcium, and 1.5 g  Calcium Beta-hydroxy-Beta-methylbutyrate to support wound healing  -Ensure Max po daily, each supplement provides 150 kcal and 30 grams of protein.    NUTRITION DIAGNOSIS:   Increased nutrient needs related to wound healing as evidenced by estimated needs.  GOAL:   Patient will meet greater than or equal to 90% of their needs  MONITOR:   PO intake, Supplement acceptance  REASON FOR ASSESSMENT:   Consult Assessment of nutrition requirement/status, Wound healing  ASSESSMENT:   Pt with medical history significant for diabetes mellitus with complications of stage 3b CKD neuropathy, history of coronary artery disease status post CABG, history of chronic combined systolic and diastolic dysfunction CHF, hypertension who was recently discharged from the hospital on 05/30/1930 for cellulitis and diabetic foot infection involving the left foot.  Pt admitted with lt DM foot ulcer.   Reviewed I/O's: -2.1 L x 24 hours  UOP: 2.2 L x 24 hours   Spoke with pt at bedside, who was pleasant and in good spirits today. He reports he was just discharged from the hospital recently secondary to sepsis and cellulitis of lt leg. He shares he has had ongoing issues with his rt and lt feet secondary to DM and neuropathy. He admits that he has a partial toe amputation on his rt foot. Pt shares that he may have gangrene on lt foot and suspect he may require amputation but understanding of further testing to assess blood flow.   Pt with good appetite, usually  consuming 2 meals per day, which consist of a meat, starch, and vegetable. Noted meal completions 100%. Pt consumes sugar free beverages, such as low sugar juice and unsweetened tea. He admits that he checks his feet about 5 times per week. Discussed importance of foot care secondary to neuropathy.   Reviewed wt hx; wt has been stable. Per pt, UBW around 205#.   Discussed importance of good meal and supplement intake to promote healing. Discussed importance of protein and vitamins conditional to wound healing. Pt amenable to supplements and plan of care.   Medications reviewed and include b-complex with vitamin C and potassium chloride.   Lab Results  Component Value Date   HGBA1C 6.9 (H) 05/27/2022   PTA DM medications are 1.5 mg duaglutide daily, 32 units tresiba daily, 25 mg jardiance daily, and 1000 mg metformin BID. Pty admits to good glycemic control at home- readings usually below 200's. He follows Dr Gabriel Carina with endocrinology as an outpatient and denies difficulty affording medications.   Labs reviewed: CBGS: 112-161 (inpatient orders for glycemic control are 0-15 units insulin aspart TID with meals and 24 units insulin determir BID).    NUTRITION - FOCUSED PHYSICAL EXAM:  Flowsheet Row Most Recent Value  Orbital Region No depletion  Upper Arm Region No depletion  Thoracic and Lumbar Region No depletion  Buccal Region No depletion  Temple Region No depletion  Clavicle Bone Region No depletion  Clavicle and Acromion Bone Region No depletion  Scapular Bone Region No depletion  Dorsal Hand No depletion  Patellar Region  Mild depletion  Anterior Thigh Region Mild depletion  Posterior Calf Region Mild depletion  Edema (RD Assessment) None  Hair Reviewed  Eyes Reviewed  Mouth Reviewed  Skin Reviewed  Nails Reviewed       Diet Order:   Diet Order             Diet heart healthy/carb modified Room service appropriate? Yes; Fluid consistency: Thin  Diet effective now                    EDUCATION NEEDS:   Education needs have been addressed  Skin:  Skin Assessment: Skin Integrity Issues: Skin Integrity Issues:: Diabetic Ulcer Diabetic Ulcer: lt foot Other: -  Last BM:  06/03/22 (type 3)  Height:   Ht Readings from Last 1 Encounters:  06/02/22 '5\' 8"'$  (1.727 m)    Weight:   Wt Readings from Last 1 Encounters:  06/02/22 95.5 kg    Ideal Body Weight:  70 kg  BMI:  Body mass index is 32.01 kg/m.  Estimated Nutritional Needs:   Kcal:  1900-2100  Protein:  105-120 grams  Fluid:  > 1.9 L    Loistine Chance, RD, LDN, [place] Registered Dietitian II Certified Diabetes Care and Education Specialist Please refer to [facility] for RD and/or RD on-call/weekend/after hours pager

## 2022-06-03 NOTE — Progress Notes (Signed)
Patient from home with wife, Wife or daughter will transport, He was admitted and discharged a few days ago from [facility] for infection He is independent at home OC to monitor and assist with DC planning as needed

## 2022-06-03 NOTE — Consult Note (Signed)
Pharmacy Antibiotic Note  Ricardo Brown is a 61 y.o. male admitted on 06/02/2022 with  left leg pain concerning for diabetic foot infection .  Pharmacy has been consulted for Vancomycin dosing.  10/31 - Patient currently in AKI but slow-improving (SCR 2.77-->2.42>2.07, baseline in the 1.5s). Will continue to monitor and adjust dosing as appropriate.  Plan: Vancomycin 1750 mg IV Q 48hrs (starting 11/2 1300).  Goal AUC 400-550. Expected AUC: 525 Expected Cmin: 9.1 SCr used: 2.07; IBW; Vd 0.5 (BMI 32)  Pt also receiving  Ceftriaxone 2g IV q24h (10/31-11/7) Metronidazole '500mg'$  IV q8h (10/31-11/7)   Height: '5\' 8"'$  (172.7 cm) Weight: 95.5 kg (210 lb 8.6 oz) IBW/kg (Calculated) : 68.4  Temp (24hrs), Avg:98.1 F (36.7 C), Min:97.9 F (36.6 C), Max:98.4 F (36.9 C)  Recent Labs  Lab 05/27/22 2013 05/27/22 2014 05/27/22 2150 05/28/22 0151 05/28/22 2355 05/29/22 0053 05/29/22 0527 05/30/22 0522 06/02/22 1005 06/03/22 0633  WBC 15.9*  --   --  14.1*  --   --  8.8  --  16.3* 10.3  CREATININE 2.77*  --   --  2.51*  --  2.06* 1.90* 1.59* 2.42* 2.07*  LATICACIDVEN  --  2.3* 1.7  --   --   --   --   --   --   --   VANCORANDOM  --   --   --   --  8  --   --   --   --   --      Estimated Creatinine Clearance: 42.5 mL/min (A) (by C-G formula based on SCr of 2.07 mg/dL (H)).    Allergies  Allergen Reactions   Lipitor [Atorvastatin] Palpitations    SVT   Sacubitril-Valsartan Other (See Comments)    SVT Other reaction(s): Other (see comments) Other reaction(s): Other (See Comments) SVT SVT   Pregabalin Palpitations    SVT Other reaction(s): Other (see comments) SVT SVT    Antimicrobials this admission: + Vancomycin (10/25 >> 10/28) (10/31>> Cefepime 10/25 >> 10/31; then +Ceftriaxone (10/31-11/07) +Metronidazole (10/25 >> 11/07)  Dose adjustments this admission: Vanc '1500mg'$  Q48h to '1750mg'$  Q48h  Microbiology results: 10/25 BCx: NGTD 10/31 Bcx: NGTD 10/31  WoundCx: NGTD   Thank you for allowing pharmacy to be a part of this patient's care.  Deanthony Maull A Miasia Crabtree 06/03/2022 11:32 AM

## 2022-06-03 NOTE — Consult Note (Signed)
[facility] Consult Note  MRN : [id number]  Ricardo Brown is a 61 y.o. (1961-07-25) male who presents with chief complaint of  Chief Complaint  Patient presents with   Foot Pain  .   Consulting Physician:Collier Bullock, MD Reason for consult: Diabetic ulceration History of Present Illness: The patient is 61 year old male who presents with a nonhealing ulceration to the left hallux first webspace as well as the plantar first metatarsal phalangeal joint.  He was sent to the emergency room following an office visit with his podiatrist with concern for cellulitis.  There was concern for early stages of gangrene as well as decreased capillary refill.  Current Facility-Administered Medications  Medication Dose Route Frequency Provider Last Rate Last Admin   0.9 %  sodium chloride infusion  250 mL Intravenous PRN Agbata, Tochukwu, MD       acetaminophen (TYLENOL) tablet 650 mg  650 mg Oral Q6H PRN Agbata, Tochukwu, MD       Or   acetaminophen (TYLENOL) suppository 650 mg  650 mg Rectal Q6H PRN Agbata, Tochukwu, MD       amiodarone (PACERONE) tablet 200 mg  200 mg Oral Daily Agbata, Tochukwu, MD       aspirin EC tablet 81 mg  81 mg Oral Daily Tollie Eth F, RPH       B-complex with vitamin C tablet 1 tablet  1 tablet Oral Daily Lorin Picket, RPH   1 tablet at 06/02/22 2130   cefTRIAXone (ROCEPHIN) 2 g in sodium chloride 0.9 % 100 mL IVPB  2 g Intravenous Q24H Agbata, Tochukwu, MD   Stopped at 06/02/22 1320   enoxaparin (LOVENOX) injection 47.5 mg  0.5 mg/kg Subcutaneous Q24H Agbata, Tochukwu, MD   47.5 mg at 06/02/22 2133   gabapentin (NEURONTIN) capsule 100 mg  100 mg Oral BID Agbata, Tochukwu, MD   100 mg at 06/02/22 2131   insulin aspart (novoLOG) injection 0-15 Units  0-15 Units Subcutaneous TID WC Agbata, Tochukwu, MD   2 Units at 06/02/22 1645   insulin detemir (LEVEMIR) injection 24 Units  24 Units Subcutaneous QHS Agbata,  Tochukwu, MD   24 Units at 06/02/22 2138   losartan (COZAAR) tablet 25 mg  25 mg Oral Daily Agbata, Tochukwu, MD       metolazone (ZAROXOLYN) tablet 2.5 mg  2.5 mg Oral Daily Agbata, Tochukwu, MD       metoprolol succinate (TOPROL-XL) 24 hr tablet 50 mg  50 mg Oral Daily Agbata, Tochukwu, MD       metroNIDAZOLE (FLAGYL) tablet 500 mg  500 mg Oral Q8H Agbata, Tochukwu, MD   500 mg at 06/02/22 2131   omega-3 acid ethyl esters (LOVAZA) capsule 1,000 mg  1,000 mg Oral BID Agbata, Tochukwu, MD   1,000 mg at 06/02/22 2131   ondansetron (ZOFRAN) tablet 4 mg  4 mg Oral Q6H PRN Agbata, Tochukwu, MD       Or   ondansetron (ZOFRAN) injection 4 mg  4 mg Intravenous Q6H PRN Agbata, Tochukwu, MD       pramipexole (MIRAPEX) tablet 1.5 mg  1.5 mg Oral BID Agbata, Tochukwu, MD   1.5 mg at 06/02/22 2131   rosuvastatin (CRESTOR) tablet 20 mg  20 mg Oral Daily Agbata, Tochukwu, MD       sodium chloride flush (NS) 0.9 % injection 3 mL  3 mL Intravenous Q12H Agbata, Tochukwu, MD   3 mL at 06/02/22 2135   sodium chloride flush (  NS) 0.9 % injection 3 mL  3 mL Intravenous PRN Agbata, Tochukwu, MD       torsemide (DEMADEX) tablet 40 mg  40 mg Oral Daily Agbata, Tochukwu, MD       [START ON 06/04/2022] vancomycin (VANCOREADY) IVPB 1500 mg/300 mL  1,500 mg Intravenous Q48H Beers, Shanon Brow, RPH        Past Medical History:  Diagnosis Date   Chronic combined systolic (congestive) and diastolic (congestive) heart failure ([place])    a. 01/2018 EF 35-40%; b. 10/2020 Echo: EF 40-45%, glob HK. RVSP 42.60mHg. Mildly dil LA. Mild MS/AoV sclerosis.   CKD (chronic kidney disease), stage Brown (HCC)    Coronary artery disease    a. 01/2018 late presenting inferior MI; b. 01/2018 Cath: Severe multivessel dzs-->CABG x 3 (LIMA->LAD, VG->OM2, VG->RPDA @ [facility]).   Diabetes mellitus without complication ([place]    a. Dx ~ 2000.   Heart palpitations    a. Pt reports prior nl echo's and stress tests. Last stress test w/in past 2 yrs - PCP.   High  cholesterol    Hypertension    Mitral regurgitation    a. 01/2018 s/p MV repair @ time of CABG.   Myocardial infarction ([facility]    Postoperative atrial fibrillation    a. 01/2018 @ time of CABG.   PSVT (paroxysmal supraventricular tachycardia)    a. Very symptomatic with multiple ED evaluations.  Has been on amio.   Severe sepsis with acute organ dysfunction ([place] 12/19/2021   Sleep difficulties 09/07/2019   STEMI (ST elevation myocardial infarction) ([place] 01/24/2018    Past Surgical History:  Procedure Laterality Date   AMPUTATION TOE Right 08/29/2021   Procedure: AMPUTATION TOE-2nd toe;  Surgeon: CSharlotte Alamo DPM;  Location: [facility] ORS;  Service: Podiatry;  Laterality: Right;   AMPUTATION TOE Left 12/22/2021   Procedure: AMPUTATION TOE-2nd Toe Partial;  Surgeon: BCaroline More DPM;  Location: [facility] ORS;  Service: Podiatry;  Laterality: Left;   CARDIAC CATHETERIZATION     CARDIAC VALVE REPLACEMENT     Mitral Valve Repair   COLONOSCOPY WITH PROPOFOL N/A 06/07/2019   Procedure: COLONOSCOPY WITH PROPOFOL;  Surgeon: TVirgel Manifold MD;  Location: [facility] ENDOSCOPY;  Service: Endoscopy;  Laterality: N/A;   COLONOSCOPY WITH PROPOFOL N/A 02/07/2020   Procedure: COLONOSCOPY WITH PROPOFOL;  Surgeon: TVirgel Manifold MD;  Location: [facility] ENDOSCOPY;  Service: Endoscopy;  Laterality: N/A;   COLONOSCOPY WITH PROPOFOL N/A 12/11/2020   Procedure: COLONOSCOPY WITH PROPOFOL;  Surgeon: TVirgel Manifold MD;  Location: [facility] ENDOSCOPY;  Service: Endoscopy;  Laterality: N/A;   CORONARY ANGIOPLASTY     CORONARY ARTERY BYPASS GRAFT     x 3   01/2018   ESOPHAGOGASTRODUODENOSCOPY N/A 02/07/2020   Procedure: ESOPHAGOGASTRODUODENOSCOPY (EGD);  Surgeon: TVirgel Manifold MD;  [facility];  Service: Endoscopy;  Laterality: N/A;   LEFT HEART CATH AND CORONARY ANGIOGRAPHY N/A 01/24/2018   Procedure: LEFT HEART CATH AND CORONARY ANGIOGRAPHY;  Surgeon: AWellington Hampshire MD;  [facility];  Service: Cardiovascular;  Laterality: N/A;   SVT ABLATION N/A 09/12/2021   Procedure: SVT ABLATION;  Surgeon: LVickie Epley MD;  [place];  Service: Cardiovascular;  Laterality: N/A;   TONSILLECTOMY      Social History Social History   Tobacco Use   Smoking status: Never   Smokeless tobacco: Never  Vaping Use   Vaping Use: Never used  Substance Use Topics   Alcohol use: Not Currently  Comment: rare beer   Drug use: No    Family History Family History  Problem Relation Age of Onset   Cancer Mother        died @ 29   CAD Father        First MI @ 35. S/p heart transplant. Died in mid-50's of cancer.   Cancer Father    Other Brother        alive and well    Allergies  Allergen Reactions   Lipitor [Atorvastatin] Palpitations    SVT   Sacubitril-Valsartan Other (See Comments)    SVT Other reaction(s): Other (see comments) Other reaction(s): Other (See Comments) SVT SVT   Pregabalin Palpitations    SVT Other reaction(s): Other (see comments) SVT SVT     REVIEW OF SYSTEMS (Negative unless checked)  Constitutional: '[]'$ Weight loss  '[]'$ Fever  '[]'$ Chills Cardiac: '[]'$ Chest pain   '[]'$ Chest pressure   '[]'$ Palpitations   '[]'$ Shortness of breath when laying flat   '[]'$ Shortness of breath at rest   '[]'$ Shortness of breath with exertion. Vascular:  '[]'$ Pain in legs with walking   '[]'$ Pain in legs at rest   '[]'$ Pain in legs when laying flat   '[]'$ Claudication   '[]'$ Pain in feet when walking  '[]'$ Pain in feet at rest  '[]'$ Pain in feet when laying flat   '[]'$ History of DVT   '[]'$ Phlebitis   '[]'$ Swelling in legs   '[]'$ Varicose veins   '[]'$ Non-healing ulcers Pulmonary:   '[]'$ Uses home oxygen   '[]'$ Productive cough   '[]'$ Hemoptysis   '[]'$ Wheeze  '[]'$ COPD   '[]'$ Asthma Neurologic:  '[]'$ Dizziness  '[]'$ Blackouts   '[]'$ Seizures   '[]'$ History of stroke   '[]'$ History of TIA  '[]'$ Aphasia   '[]'$ Temporary blindness   '[]'$ Dysphagia   '[]'$ Weakness or numbness in arms   '[]'$ Weakness or numbness in legs Musculoskeletal:  '[]'$ Arthritis    '[]'$ Joint swelling   '[]'$ Joint pain   '[]'$ Low back pain Hematologic:  '[]'$ Easy bruising  '[]'$ Easy bleeding   '[]'$ Hypercoagulable state   '[]'$ Anemic  '[]'$ Hepatitis Gastrointestinal:  '[]'$ Blood in stool   '[]'$ Vomiting blood  '[]'$ Gastroesophageal reflux/heartburn   '[]'$ Difficulty swallowing. Genitourinary:  '[]'$ Chronic kidney disease   '[]'$ Difficult urination  '[]'$ Frequent urination  '[]'$ Burning with urination   '[]'$ Blood in urine Skin:  '[]'$ Rashes   '[x]'$ Ulcers   '[]'$ Wounds Psychological:  '[]'$ History of anxiety   '[]'$  History of major depression.  Physical Examination  Vitals:   06/02/22 1300 06/02/22 1515 06/02/22 2120 06/03/22 0028  BP:  104/60 109/60 116/60  Pulse: 75 65 64 64  Resp: '20 18 18   '$ Temp: 98 F (36.7 C) 98 F (36.7 C) 98.1 F (36.7 C) 98.4 F (36.9 C)  TempSrc: Oral  Oral Oral  SpO2: 98% 99% 98% 99%  Weight:      Height:       Body mass index is 32.01 kg/m. Gen:  WD/WN, NAD Head: [place]/AT, No temporalis wasting. Prominent temp pulse not noted. Ear/Nose/Throat: Hearing grossly intact, nares w/o erythema or drainage, oropharynx w/o Erythema/Exudate Eyes: Sclera non-icteric, conjunctiva clear Neck: Trachea midline.  No JVD.  Pulmonary:  Good air movement, respirations not labored, equal bilaterally.  Cardiac: RRR, normal S1, S2. Vascular:  Vessel Right Left  PT Not Palpable Not Palpable  DP Not Palpable Not Palpable   Gastrointestinal: soft, non-tender/non-distended. No guarding/reflex.  Musculoskeletal: M/S 5/5 throughout.  Extremities without ischemic changes.  No deformity or atrophy. Neurologic: Sensation grossly intact in extremities.  Symmetrical.  Speech is fluent. Motor exam as listed above. Psychiatric: Judgment intact, Mood & affect appropriate for  pt's clinical situation. Dermatologic: See photos Lymph : No Cervical, Axillary, or Inguinal lymphadenopathy.       CBC Lab Results  Component Value Date   WBC 16.3 (H) 06/02/2022   HGB 10.3 (L) 06/02/2022   HCT 31.1 (L) 06/02/2022   MCV 87.9  06/02/2022   PLT 316 06/02/2022    BMET    Component Value Date/Time   NA 134 (L) 06/02/2022 1005   NA 136 08/26/2021 0952   K 3.9 06/02/2022 1005   CL 98 06/02/2022 1005   CO2 25 06/02/2022 1005   GLUCOSE 208 (H) 06/02/2022 1005   BUN 58 (H) 06/02/2022 1005   BUN 69 (H) 08/26/2021 0952   CREATININE 2.42 (H) 06/02/2022 1005   CREATININE 1.31 09/30/2020 1114   CALCIUM 8.6 (L) 06/02/2022 1005   GFRNONAA 30 (L) 06/02/2022 1005   GFRNONAA 59 (L) 09/30/2020 1114   GFRAA 69 09/30/2020 1114   Estimated Creatinine Clearance: 36.4 mL/min (A) (by C-G formula based on SCr of 2.42 mg/dL (H)).  COAG Lab Results  Component Value Date   INR 1.2 05/27/2022   INR 1.3 (H) 12/20/2021   INR 1.1 12/19/2021    Radiology DG Foot Complete Left  Result Date: 06/02/2022 CLINICAL DATA:  Infection. Wound between left first and second toes. Diabetes. EXAM: LEFT FOOT - COMPLETE 3+ VIEW COMPARISON:  Left foot radiographs 12/22/2021 FINDINGS: Redemonstration of amputation of the distal phalanx of the second toe. The resection margins remain sharp. Mild hallux valgus and mild great toe metatarsophalangeal joint space narrowing and peripheral osteophytosis. Small plantar and posterior calcaneal heel spurs. Mild dorsal talonavicular, navicular-cuneiform, and tarsometatarsal degenerative osteophytes. There is scattered lucency likely related to the reported wound just medial to the great toe proximal phalanx on frontal view. No definite cortical erosion is seen. Moderate atherosclerotic calcifications. IMPRESSION: 1. No cortical erosion to indicate radiographic evidence of osteomyelitis. 2. Mild hallux valgus and great toe metatarsophalangeal joint osteoarthritis. Electronically Signed   By: Yvonne Kendall M.D.   On: 06/02/2022 10:16   DG Chest 2 View  Result Date: 05/27/2022 CLINICAL DATA:  Foot swelling and fever. EXAM: CHEST - 2 VIEW COMPARISON:  12/19/2021 FINDINGS: Postoperative changes in the mediastinum.  Heart size and pulmonary vascularity are normal. Lungs are clear. No pleural effusions. No pneumothorax. Mediastinal contours appear intact. IMPRESSION: No active cardiopulmonary disease. Electronically Signed   By: Lucienne Capers M.D.   On: 05/27/2022 20:57   MR FOOT LEFT WO CONTRAST  Result Date: 05/25/2022 CLINICAL DATA:  Foot pain, diabetic EXAM: MRI OF THE LEFT FOOT WITHOUT CONTRAST TECHNIQUE: Multiplanar, multisequence MR imaging of the left forefoot was performed. No intravenous contrast was administered. COMPARISON:  Left foot radiograph 12/22/2021, left foot MRI 12/20/2021 FINDINGS: Bones/Joint/Cartilage Prior amputation of the second digit distal phalanx. There is periarticular marrow edema in the midfoot most prominent at the second and third tarsometatarsal joints related to moderate osteoarthritis, unchanged from prior. There is no other significant marrow signal alteration. Ligaments Intact Lisfranc ligament.  Intact MTP collateral ligaments. Muscles and Tendons Diffuse intramuscular edema and atrophy in the foot as is commonly seen in diabetics likely related to denervation change. No acute tendon tear in the forefoot. Soft tissues Diffuse soft tissue swelling of the foot. There is no focal fluid collection. No susceptibility artifact identified. IMPRESSION: No evidence of osteomyelitis or soft tissue abscess. Diffuse soft tissue swelling, as can be seen in lymphedema or cellulitis. Moderate second and third tarsometatarsal joint osteoarthritis. Electronically Signed  By: Maurine Simmering M.D.   On: 05/25/2022 15:52      Assessment/Plan 1.  Diabetic ulcer  The patient has several risk factors concerning for possible peripheral arterial disease.  Previous noninvasive studies in January show some minimal evidence of peripheral arterial disease.  Typically in this instance because of the extensiveness of his wound we would typically prefer to have the patient undergo a left lower extremity  angiogram.  However, the patient has had a notable increase in his creatinine.  Following his most recent hospitalization, his creatinine was 1.59 at discharge however it is 2.42.  Given this drastic increase in creatinine it would not be in the best interest of the patient undergo angiogram at this time.  Instead we will have the patient undergo ABIs in order to risk stratify.  Recommend hydration of the patient if possible to see if this helps with improvement in kidney function. 2. Diabetes SSI per primary team  Plan of care discussed with Dr.Dew and he is in agreement with plan noted above.   Family Communication:  Total Time:75 minutes I spent 75 minutes in this encounter including personally reviewing extensive medical records, personally reviewing imaging studies and compared to prior scans, counseling the patient, placing orders, coordinating care and performing appropriate documentation  Thank you for allowing [place] to participate in the care of this patient.   Kris Hartmann, NP [facility] [phone] (Office Phone) [phone] (Office Fax) [phone] (Pager)  06/03/2022 1:10 AM  Staff may message me via secure chat in [place]  but this may not receive immediate response,  please page for urgent matters!  Dictation software was used to generate the above note. Typos may occur and escape review, as with typed/written notes. Any error is purely unintentional.  Please contact me directly for clarity if needed.

## 2022-06-03 NOTE — Progress Notes (Signed)
[facility]                                                                              Ricardo Brown, is a 61 y.o. male, DOB - 02/11/1961, [id number] Admit date - 06/02/2022    Outpatient Primary MD for the patient is [name], MD  LOS - 1  days  Chief Complaint  Patient presents with   Foot Pain       Brief summary   Patient is a 61 year old male with diabetes mellitus, CKD stage IIIb, CAD status post CABG, history of chronic combined systolic and diastolic dysfunction, HTN was recently discharged on 05/30/2022, for cellulitis and diabetic foot infection involving the left foot. Patient followed up with his podiatrist outpatient and was noted to have findings concerning for early gangrene involving his left foot and were advised to return to ED CBC showed WBC count of 16.3 Imaging showed no radiographic evidence of osteomyelitis, patient was placed on IV antibiotics and admitted for further work-up.   Assessment & Plan    Principal Problem: Left diabetic foot infection ([place]) -Foot x-ray, left showed no acute osteomyelitis -ABIs that showed resting ABI within normal limits on the right, on the left midrange arterial occlusive disease.  Holding off on angiogram due to AKI -Podiatry following -Continue IV vancomycin, Rocephin  Active Problems: Acute kidney injury superimposed on CKD stage IIIb -Baseline creatinine between 1.9-2.0, creatinine was 1.5 on 10/28 -Creatinine 2.4 on admission -Improving to 2.0 -Currently on losartan, tosemide, Zaroxolyn, will hold losartan  -Follow bmet in a.m.    Chronic combined systolic (congestive) and diastolic (congestive) heart failure ([place]) -2D echo 5/23 had shown EF of 45 to 50% -Current euvolemic, continue metoprolol, metolazone, torsemide -Hold losartan -Negative balance of 2.0 L, follow renal function    Type 2 diabetes mellitus, uncontrolled with diabetic chronic kidney disease ([place]) -Continue  sliding scale insulin, moderate, Levemir 24 units daily  Recent Labs    06/02/22 1224 06/02/22 1614 06/02/22 2123 06/03/22 0816 06/03/22 1403  GLUCAP 155* 129* 128* 124* 161*       PAD (peripheral artery disease) ([place]) -vascular surgery following    Hypertension, CAD status post CABG -BP stable, hold losartan -Continue torsemide, Zaroxolyn, metoprolol    Obesity (BMI 30-39.9) Estimated body mass index is 32.01 kg/m as calculated from the following:   Height as of this encounter: '5\' 8"'$  (1.727 m).   Weight as of this encounter: 95.5 kg.  Code Status: Full code DVT Prophylaxis:     Level of Care: Level of care: Med-Surg Family Communication: Updated patient  Disposition Plan:      Remains inpatient appropriate: work up in progress   Procedures:  None  Consultants:   Vascular surgery  Podiatry   Antimicrobials:   Anti-infectives (From admission, onward)    Start     Dose/Rate Route Frequency Ordered Stop   06/04/22 1300  vancomycin (VANCOREADY) IVPB 1500 mg/300 mL  Status:  Discontinued        1,500 mg 150 mL/hr over 120 Minutes Intravenous Every 48 hours 06/02/22 1507 06/03/22 1129   06/04/22 0800  vancomycin (VANCOREADY) IVPB 1750  mg/350 mL        1,750 mg 175 mL/hr over 120 Minutes Intravenous Every 48 hours 06/03/22 1129 06/12/22 0759   06/02/22 1400  metroNIDAZOLE (FLAGYL) tablet 500 mg        500 mg Oral Every 8 hours 06/02/22 1150 06/09/22 1359   06/02/22 1230  cefTRIAXone (ROCEPHIN) 2 g in sodium chloride 0.9 % 100 mL IVPB        2 g 200 mL/hr over 30 Minutes Intravenous Every 24 hours 06/02/22 1150 06/09/22 1229   06/02/22 1215  vancomycin (VANCOREADY) IVPB 2000 mg/400 mL  Status:  Discontinued        2,000 mg 200 mL/hr over 120 Minutes Intravenous  Once 06/02/22 1208 06/02/22 1209   06/02/22 1215  vancomycin (VANCOCIN) IVPB 1000 mg/200 mL premix        1,000 mg 200 mL/hr over 60 Minutes Intravenous  Once 06/02/22 1209 06/02/22 1538   06/02/22  0945  vancomycin (VANCOCIN) IVPB 1000 mg/200 mL premix        1,000 mg 200 mL/hr over 60 Minutes Intravenous  Once 06/02/22 0944 06/02/22 1134          Medications  amiodarone  200 mg Oral Daily   aspirin EC  81 mg Oral Daily   B-complex with vitamin C  1 tablet Oral Daily   enoxaparin (LOVENOX) injection  0.5 mg/kg Subcutaneous Q24H   gabapentin  100 mg Oral BID   insulin aspart  0-15 Units Subcutaneous TID WC   insulin detemir  24 Units Subcutaneous QHS   losartan  25 mg Oral Daily   metolazone  2.5 mg Oral Daily   metoprolol succinate  50 mg Oral Daily   metroNIDAZOLE  500 mg Oral Q8H   omega-3 acid ethyl esters  1,000 mg Oral BID   pramipexole  1.5 mg Oral BID   rosuvastatin  20 mg Oral Daily   sodium chloride flush  3 mL Intravenous Q12H   torsemide  40 mg Oral Daily      Subjective:   Ricardo Brown was seen and examined today.  No acute complaints, concerned about potential amputation.  No fevers or chills.  No acute chest pain or shortness of breath.  Objective:   Vitals:   06/02/22 2120 06/03/22 0028 06/03/22 0715 06/03/22 0829  BP: 109/60 116/60 124/70 126/68  Pulse: 64 64 67 65  Resp: '18  16 14  '$ Temp: 98.1 F (36.7 C) 98.4 F (36.9 C) 97.9 F (36.6 C) 98.4 F (36.9 C)  TempSrc: Oral Oral Oral   SpO2: 98% 99% 100% 100%  Weight:      Height:        Intake/Output Summary (Last 24 hours) at 06/03/2022 1347 Last data filed at 06/03/2022 0900 Gross per 24 hour  Intake 440 ml  Output 2500 ml  Net -2060 ml     Wt Readings from Last 3 Encounters:  06/02/22 95.5 kg  05/30/22 95.5 kg  04/30/22 94.9 kg     Exam General: Alert and oriented x 3, NAD Cardiovascular: S1 S2 auscultated,  RRR Respiratory: Clear to auscultation bilaterally, no wheezing Gastrointestinal: Soft, nontender, nondistended, + bowel sounds Ext: no pedal edema bilaterally Neuro: no new FND's Skin: As below, left foot  Psych: Normal affect and demeanor, alert and oriented  x3       Data Reviewed:  I have personally reviewed following labs    CBC Lab Results  Component Value Date   WBC 10.3 06/03/2022  RBC 3.24 (L) 06/03/2022   HGB 9.6 (L) 06/03/2022   HCT 29.0 (L) 06/03/2022   MCV 89.5 06/03/2022   MCH 29.6 06/03/2022   PLT 280 06/03/2022   MCHC 33.1 06/03/2022   RDW 13.2 06/03/2022   LYMPHSABS 1.1 05/27/2022   MONOABS 1.0 05/27/2022   EOSABS 0.0 05/27/2022   BASOSABS 0.0 46/27/0350     Last metabolic panel Lab Results  Component Value Date   NA 134 (L) 06/03/2022   K 3.3 (L) 06/03/2022   CL 100 06/03/2022   CO2 26 06/03/2022   BUN 56 (H) 06/03/2022   CREATININE 2.07 (H) 06/03/2022   GLUCOSE 117 (H) 06/03/2022   GFRNONAA 36 (L) 06/03/2022   GFRAA 69 09/30/2020   CALCIUM 8.2 (L) 06/03/2022   PHOS 3.9 05/27/2022   PROT 7.7 06/02/2022   ALBUMIN 3.2 (L) 06/02/2022   BILITOT 0.6 06/02/2022   ALKPHOS 63 06/02/2022   AST 78 (H) 06/02/2022   ALT 73 (H) 06/02/2022   ANIONGAP 8 06/03/2022    CBG (last 3)  Recent Labs    06/02/22 1614 06/02/22 2123 06/03/22 0816  GLUCAP 129* 128* 124*      Coagulation Profile: Recent Labs  Lab 05/27/22 2013  INR 1.2     Radiology Studies: I have personally reviewed the imaging studies  US ARTERIAL ABI (SCREENING LOWER EXTREMITY)  Result Date: 06/03/2022 CLINICAL DATA:  61 year old male with a history of ulcer EXAM: NONINVASIVE PHYSIOLOGIC VASCULAR STUDY OF BILATERAL LOWER EXTREMITIES DIRECTED DUPLEX LEFT LOWER EXTREMITY ARTERIAL STUDY TECHNIQUE: Evaluation of both lower extremities was performed at rest, including calculation of ankle-brachial indices, multiple segmental pressure evaluation, segmental Doppler and segmental pulse volume recording. Directed duplex performed of the left lower extremity COMPARISON:  None Available. FINDINGS: Right ABI:  1.05 Left ABI:  0.89 Right Lower Extremity: Segmental Doppler at the right ankle demonstrates triphasic waveforms Left Lower Extremity:  Segmental Doppler at the left ankle demonstrates monophasic waveforms Directed duplex of the left lower extremity demonstrates triphasic waveform of the femoropopliteal system. Triphasic anterior tibial artery. Monophasic posterior tibial artery. IMPRESSION: Right: Resting ABI within normal limits. Segmental exam performed at the right ankle demonstrates waveforms relatively maintained. Left: Resting ABI in the mild range arterial occlusive disease. Segmental and directed duplex of the left lower extremity demonstrates tibial segment arterial occlusive disease. Signed, Dulcy Fanny. Nadene Rubins, RPVI [facility] Electronically Signed   By: Corrie Mckusick D.O.   On: 06/03/2022 13:02   US ARTERIAL LOWER EXTREMITY DUPLEX LEFT (NON-ABI)  Result Date: 06/03/2022 CLINICAL DATA:  61 year old male with a history of ulcer EXAM: NONINVASIVE PHYSIOLOGIC VASCULAR STUDY OF BILATERAL LOWER EXTREMITIES DIRECTED DUPLEX LEFT LOWER EXTREMITY ARTERIAL STUDY TECHNIQUE: Evaluation of both lower extremities was performed at rest, including calculation of ankle-brachial indices, multiple segmental pressure evaluation, segmental Doppler and segmental pulse volume recording. Directed duplex performed of the left lower extremity COMPARISON:  None Available. FINDINGS: Right ABI:  1.05 Left ABI:  0.89 Right Lower Extremity: Segmental Doppler at the right ankle demonstrates triphasic waveforms Left Lower Extremity: Segmental Doppler at the left ankle demonstrates monophasic waveforms Directed duplex of the left lower extremity demonstrates triphasic waveform of the femoropopliteal system. Triphasic anterior tibial artery. Monophasic posterior tibial artery. IMPRESSION: Right: Resting ABI within normal limits. Segmental exam performed at the right ankle demonstrates waveforms relatively maintained. Left: Resting ABI in the mild range arterial occlusive disease. Segmental and directed  duplex of the left lower extremity demonstrates tibial  segment arterial occlusive disease. Signed, Dulcy Fanny. Nadene Rubins, RPVI [facility] Electronically Signed   By: Corrie Mckusick D.O.   On: 06/03/2022 13:02   DG Foot Complete Left  Result Date: 06/02/2022 CLINICAL DATA:  Infection. Wound between left first and second toes. Diabetes. EXAM: LEFT FOOT - COMPLETE 3+ VIEW COMPARISON:  Left foot radiographs 12/22/2021 FINDINGS: Redemonstration of amputation of the distal phalanx of the second toe. The resection margins remain sharp. Mild hallux valgus and mild great toe metatarsophalangeal joint space narrowing and peripheral osteophytosis. Small plantar and posterior calcaneal heel spurs. Mild dorsal talonavicular, navicular-cuneiform, and tarsometatarsal degenerative osteophytes. There is scattered lucency likely related to the reported wound just medial to the great toe proximal phalanx on frontal view. No definite cortical erosion is seen. Moderate atherosclerotic calcifications. IMPRESSION: 1. No cortical erosion to indicate radiographic evidence of osteomyelitis. 2. Mild hallux valgus and great toe metatarsophalangeal joint osteoarthritis. Electronically Signed   By: Yvonne Kendall M.D.   On: 06/02/2022 10:16       Rosi Secrist M.D. [facility] 06/03/2022, 1:47 PM  Available via Epic secure chat 7am-7pm After 7 pm, please refer to night coverage provider listed on amion.

## 2022-06-03 NOTE — Plan of Care (Signed)

## 2022-06-04 ENCOUNTER — Inpatient Hospital Stay: Payer: Medicare Other

## 2022-06-04 DIAGNOSIS — I5022 Chronic systolic (congestive) heart failure: Secondary | ICD-10-CM

## 2022-06-04 DIAGNOSIS — Z794 Long term (current) use of insulin: Secondary | ICD-10-CM

## 2022-06-04 DIAGNOSIS — E11628 Type 2 diabetes mellitus with other skin complications: Secondary | ICD-10-CM | POA: Diagnosis not present

## 2022-06-04 DIAGNOSIS — L03116 Cellulitis of left lower limb: Secondary | ICD-10-CM | POA: Diagnosis not present

## 2022-06-04 DIAGNOSIS — N183 Chronic kidney disease, stage 3 unspecified: Secondary | ICD-10-CM

## 2022-06-04 DIAGNOSIS — E1122 Type 2 diabetes mellitus with diabetic chronic kidney disease: Secondary | ICD-10-CM

## 2022-06-04 LAB — BASIC METABOLIC PANEL
Anion gap: 10 (ref 5–15)
BUN: 56 mg/dL — ABNORMAL HIGH (ref 6–20)
CO2: 27 mmol/L (ref 22–32)
Calcium: 8.7 mg/dL — ABNORMAL LOW (ref 8.9–10.3)
Chloride: 96 mmol/L — ABNORMAL LOW (ref 98–111)
Creatinine, Ser: 2.1 mg/dL — ABNORMAL HIGH (ref 0.61–1.24)
GFR, Estimated: 35 mL/min — ABNORMAL LOW (ref >60)
Glucose, Bld: 183 mg/dL — ABNORMAL HIGH (ref 70–99)
Potassium: 3.7 mmol/L (ref 3.5–5.1)
Sodium: 133 mmol/L — ABNORMAL LOW (ref 135–145)

## 2022-06-04 LAB — CBC
HCT: 33.2 % — ABNORMAL LOW (ref 39.0–52.0)
Hemoglobin: 11.2 g/dL — ABNORMAL LOW (ref 13.0–17.0)
MCH: 29.2 pg (ref 26.0–34.0)
MCHC: 33.7 g/dL (ref 30.0–36.0)
MCV: 86.7 fL (ref 80.0–100.0)
Platelets: 344 10*3/uL (ref 150–400)
RBC: 3.83 MIL/uL — ABNORMAL LOW (ref 4.22–5.81)
RDW: 13.2 % (ref 11.5–15.5)
WBC: 12.6 10*3/uL — ABNORMAL HIGH (ref 4.0–10.5)
nRBC: 0 % (ref 0.0–0.2)

## 2022-06-04 LAB — GLUCOSE, CAPILLARY
Glucose-Capillary: 178 mg/dL — ABNORMAL HIGH (ref 70–99)
Glucose-Capillary: 211 mg/dL — ABNORMAL HIGH (ref 70–99)
Glucose-Capillary: 144 mg/dL — ABNORMAL HIGH (ref 70–99)
Glucose-Capillary: 208 mg/dL — ABNORMAL HIGH (ref 70–99)

## 2022-06-04 MED ORDER — PANTOPRAZOLE SODIUM 40 MG PO TBEC: 40.0000 mg | DELAYED_RELEASE_TABLET | Freq: Every day | ORAL | Status: DC

## 2022-06-04 MED ORDER — VANCOMYCIN HCL 1250 MG/250ML IV SOLN
1250.0000 mg | INTRAVENOUS | Status: DC
  Filled 2022-06-04: qty 250

## 2022-06-04 MED ORDER — ALUM & MAG HYDROXIDE-SIMETH 200-200-20 MG/5ML PO SUSP: 15.0000 mL | Freq: Four times a day (QID) | ORAL | Status: DC | PRN

## 2022-06-04 NOTE — Consult Note (Addendum)
Pharmacy Antibiotic Note  Ricardo Brown is a 61 y.o. male admitted on 06/02/2022 with  left leg pain concerning for diabetic foot infection .  Pharmacy has been consulted for Vancomycin dosing.  06/04/2022  Day #3 Renal: SCr 2.10 with PMH of CKD - at or slightly above baseline SCr WBC 12.6 Afebrile Wound culture and blood culture unrevealing thus far  Plan: Vancomycin '1250mg'$  IV q24h Expected AUC: 528 Expected Cmin: 14.9 SCr used: 2.10; IBW; Vd 0.72 (BMI 32) Follow renal function Follow-up plan for possible surgery  Pt also receiving  Ceftriaxone 2g IV q24h (10/31-11/7) Metronidazole '500mg'$  IV q8h (10/31-11/7)   Height: '5\' 8"'$  (172.7 cm) Weight: 95.5 kg (210 lb 8.6 oz) IBW/kg (Calculated) : 68.4  Temp (24hrs), Avg:98.3 F (36.8 C), Min:98 F (36.7 C), Max:98.5 F (36.9 C)  Recent Labs  Lab 05/28/22 2355 05/29/22 0053 05/29/22 0527 05/30/22 0522 06/02/22 1005 06/03/22 0633 06/04/22 0713  WBC  --   --  8.8  --  16.3* 10.3 12.6*  CREATININE  --    < > 1.90* 1.59* 2.42* 2.07* 2.10*  VANCORANDOM 8  --   --   --   --   --   --    < > = values in this interval not displayed.     Estimated Creatinine Clearance: 41.9 mL/min (A) (by C-G formula based on SCr of 2.1 mg/dL (H)).    Allergies  Allergen Reactions   Lipitor [Atorvastatin] Palpitations    SVT   Sacubitril-Valsartan Other (See Comments)    SVT Other reaction(s): Other (see comments) Other reaction(s): Other (See Comments) SVT SVT   Pregabalin Palpitations    SVT Other reaction(s): Other (see comments) SVT SVT    Antimicrobials this admission: + Vancomycin (10/25 >> 10/28) (10/31>> Cefepime 10/25 >> 10/31; then +Ceftriaxone (10/31-11/07) +Metronidazole (10/25 >> 11/07)  Dose adjustments this admission: Vanc '1500mg'$  Q48h to '1750mg'$  Q48h  Microbiology results: 10/25 BCx: NGTD 10/31 Bcx: NGTD 10/31 WoundCx: NGTD   Thank you for allowing pharmacy to be a part of this patient's  care.  Doreene Eland, PharmD, BCPS, BCIDP Work Cell: [phone] 06/04/2022 3:31 PM

## 2022-06-04 NOTE — Progress Notes (Addendum)
[facility]                                                                              Ricardo Brown, is a 61 y.o. male, DOB - [birth year], [id number] Admit date - 06/02/2022    Outpatient Primary MD for the patient is [name], MD  LOS - 2  days  Chief Complaint  Patient presents with   Foot Pain       Brief summary   Patient is a 61 year old male with diabetes mellitus, CKD stage IIIb, CAD status post CABG, history of chronic combined systolic and diastolic dysfunction, HTN was recently discharged on 05/30/2022, for cellulitis and diabetic foot infection involving the left foot. Patient followed up with his podiatrist outpatient and was noted to have findings concerning for early gangrene involving his left foot and were advised to return to ED CBC showed WBC count of 16.3 Imaging showed no radiographic evidence of osteomyelitis, patient was placed on IV antibiotics and admitted for further work-up.   Assessment & Plan    Principal Problem: Left diabetic foot infection ([place]), gangrene left forefoot, possible osteomyelitis left first MTP joint in the setting of severe PVD -Foot x-ray, left showed no acute osteomyelitis -ABIs that showed resting ABI within normal limits on the right, on the left midrange arterial occlusive disease.  Holding off on angiogram due to AKI -Podiatry following, wound examined today, recommending repeat MRI of the left foot without contrast due to concerns for deeper infection.  Dressing changes daily. -Continue IV vancomycin, Rocephin  Severe PAD -Vascular surgery following, angiogram on hold due to AKI -Holding losartan, torsemide, Zaroxolyn -Creatinine improving, 2.1, was 2.4 on admission.   Acute kidney injury superimposed on CKD stage IIIb -Baseline creatinine between 1.9-2.0, creatinine was 1.5 on 10/28 -Creatinine 2.4 on admission -Losartan, torsemide, Zaroxolyn placed on hold. -Creatinine improving 2.1  today    Chronic combined systolic (congestive) and diastolic (congestive) heart failure ([place]) -2D echo 5/23 had shown EF of 45 to 50% -Current euvolemic -Holding losartan, torsemide, Zaroxolyn.  Follow volume status closely.  Lungs currently clear, no hypoxia, no significant pedal edema.    Type 2 diabetes mellitus, uncontrolled with diabetic chronic kidney disease ([place]) -Continue SSI, Levemir 24 units daily  Hemoglobin A1c 6.9 on 05/27/2022   Hypertension, CAD status post CABG -BP currently soft, hold torsemide, metolazone, losartan -Continue metoprolol  Hypokalemia K 3.3 on 11/1, replaced Potassium 3.7 today.    Obesity (BMI 30-39.9) Estimated body mass index is 32.01 kg/m as calculated from the following:   Height as of this encounter: '5\' 8"'$  (1.727 m).   Weight as of this encounter: 95.5 kg.  Code Status: Full code DVT Prophylaxis:     Level of Care: Level of care: Med-Surg Family Communication: Updated patient  Disposition Plan:      Remains inpatient appropriate: work up in progress   Procedures:  None  Consultants:   Vascular surgery  Podiatry   Antimicrobials:   Anti-infectives (From admission, onward)    Start     Dose/Rate Route Frequency Ordered Stop   06/04/22 1300  vancomycin (VANCOREADY) IVPB 1500 mg/300 mL  Status:  Discontinued        1,500 mg 150 mL/hr over 120 Minutes Intravenous Every 48 hours 06/02/22 1507 06/03/22 1129   06/04/22 0800  vancomycin (VANCOREADY) IVPB 1750 mg/350 mL        1,750 mg 175 mL/hr over 120 Minutes Intravenous Every 48 hours 06/03/22 1129 06/12/22 0759   06/02/22 1400  metroNIDAZOLE (FLAGYL) tablet 500 mg        500 mg Oral Every 8 hours 06/02/22 1150 06/09/22 1359   06/02/22 1230  cefTRIAXone (ROCEPHIN) 2 g in sodium chloride 0.9 % 100 mL IVPB        2 g 200 mL/hr over 30 Minutes Intravenous Every 24 hours 06/02/22 1150 06/09/22 1229   06/02/22 1215  vancomycin (VANCOREADY) IVPB 2000 mg/400 mL  Status:   Discontinued        2,000 mg 200 mL/hr over 120 Minutes Intravenous  Once 06/02/22 1208 06/02/22 1209   06/02/22 1215  vancomycin (VANCOCIN) IVPB 1000 mg/200 mL premix        1,000 mg 200 mL/hr over 60 Minutes Intravenous  Once 06/02/22 1209 06/02/22 1538   06/02/22 0945  vancomycin (VANCOCIN) IVPB 1000 mg/200 mL premix        1,000 mg 200 mL/hr over 60 Minutes Intravenous  Once 06/02/22 0944 06/02/22 1134          Medications  amiodarone  200 mg Oral Daily   vitamin C  500 mg Oral BID   aspirin EC  81 mg Oral Daily   B-complex with vitamin C  1 tablet Oral Daily   enoxaparin (LOVENOX) injection  0.5 mg/kg Subcutaneous Q24H   gabapentin  100 mg Oral BID   insulin aspart  0-15 Units Subcutaneous TID WC   insulin detemir  24 Units Subcutaneous QHS   metoprolol succinate  50 mg Oral Daily   metroNIDAZOLE  500 mg Oral Q8H   multivitamin with minerals  1 tablet Oral Daily   nutrition supplement (JUVEN)  1 packet Oral BID BM   omega-3 acid ethyl esters  1,000 mg Oral BID   pramipexole  1.5 mg Oral BID   Ensure Max Protein  11 oz Oral QHS   rosuvastatin  20 mg Oral Daily   sodium chloride flush  3 mL Intravenous Q12H   zinc sulfate  220 mg Oral Daily      Subjective:   Ricardo Brown was seen and examined today.  Patient in agreement to hold diuretics, hopefully creatinine will improve to get the angiogram on Monday.  Currently no chest pain, shortness of breath, dizziness or lower extremity edema.   Objective:   Vitals:   06/03/22 0715 06/03/22 0829 06/04/22 0007 06/04/22 0729  BP: 124/70 126/68 104/63 108/62  Pulse: 67 65 68 68  Resp: '16 14 17 18  '$ Temp: 97.9 F (36.6 C) 98.4 F (36.9 C) 98 F (36.7 C) 98.5 F (36.9 C)  TempSrc: Oral     SpO2: 100% 100% 99% 99%  Weight:      Height:        Intake/Output Summary (Last 24 hours) at 06/04/2022 1300 Last data filed at 06/04/2022 0841 Gross per 24 hour  Intake 480 ml  Output 300 ml  Net 180 ml     Wt  Readings from Last 3 Encounters:  06/02/22 95.5 kg  05/30/22 95.5 kg  04/30/22 94.9 kg   Physical Exam General: Alert and oriented x 3, NAD Cardiovascular: S1 S2 clear, RRR.  Respiratory: CTAB,  no wheezing, rales  Gastrointestinal: Soft, nontender, nondistended, NBS Ext: no pedal edema bilaterally Neuro: no new deficits Skin: Left foot dressing intact Psych: Normal affect     Data Reviewed:  I have personally reviewed following labs    CBC Lab Results  Component Value Date   WBC 12.6 (H) 06/04/2022   RBC 3.83 (L) 06/04/2022   HGB 11.2 (L) 06/04/2022   HCT 33.2 (L) 06/04/2022   MCV 86.7 06/04/2022   MCH 29.2 06/04/2022   PLT 344 06/04/2022   MCHC 33.7 06/04/2022   RDW 13.2 06/04/2022   LYMPHSABS 1.1 05/27/2022   MONOABS 1.0 05/27/2022   EOSABS 0.0 05/27/2022   BASOSABS 0.0 18/29/9371     Last metabolic panel Lab Results  Component Value Date   NA 133 (L) 06/04/2022   K 3.7 06/04/2022   CL 96 (L) 06/04/2022   CO2 27 06/04/2022   BUN 56 (H) 06/04/2022   CREATININE 2.10 (H) 06/04/2022   GLUCOSE 183 (H) 06/04/2022   GFRNONAA 35 (L) 06/04/2022   GFRAA 69 09/30/2020   CALCIUM 8.7 (L) 06/04/2022   PHOS 3.9 05/27/2022   PROT 7.7 06/02/2022   ALBUMIN 3.2 (L) 06/02/2022   BILITOT 0.6 06/02/2022   ALKPHOS 63 06/02/2022   AST 78 (H) 06/02/2022   ALT 73 (H) 06/02/2022   ANIONGAP 10 06/04/2022    CBG (last 3)  Recent Labs    06/03/22 2031 06/04/22 0730 06/04/22 1108  GLUCAP 147* 178* 211*      Coagulation Profile: No results for input(s): "INR", "PROTIME" in the last 168 hours.    Radiology Studies: I have personally reviewed the imaging studies  US ARTERIAL ABI (SCREENING LOWER EXTREMITY)  Result Date: 06/03/2022 CLINICAL DATA:  61 year old male with a history of ulcer EXAM: NONINVASIVE PHYSIOLOGIC VASCULAR STUDY OF BILATERAL LOWER EXTREMITIES DIRECTED DUPLEX LEFT LOWER EXTREMITY ARTERIAL STUDY TECHNIQUE: Evaluation of both lower extremities was  performed at rest, including calculation of ankle-brachial indices, multiple segmental pressure evaluation, segmental Doppler and segmental pulse volume recording. Directed duplex performed of the left lower extremity COMPARISON:  None Available. FINDINGS: Right ABI:  1.05 Left ABI:  0.89 Right Lower Extremity: Segmental Doppler at the right ankle demonstrates triphasic waveforms Left Lower Extremity: Segmental Doppler at the left ankle demonstrates monophasic waveforms Directed duplex of the left lower extremity demonstrates triphasic waveform of the femoropopliteal system. Triphasic anterior tibial artery. Monophasic posterior tibial artery. IMPRESSION: Right: Resting ABI within normal limits. Segmental exam performed at the right ankle demonstrates waveforms relatively maintained. Left: Resting ABI in the mild range arterial occlusive disease. Segmental and directed duplex of the left lower extremity demonstrates tibial segment arterial occlusive disease. Signed, Dulcy Fanny. Nadene Rubins, RPVI [facility] Electronically Signed   By: Corrie Mckusick D.O.   On: 06/03/2022 13:02   US ARTERIAL LOWER EXTREMITY DUPLEX LEFT (NON-ABI)  Result Date: 06/03/2022 CLINICAL DATA:  61 year old male with a history of ulcer EXAM: NONINVASIVE PHYSIOLOGIC VASCULAR STUDY OF BILATERAL LOWER EXTREMITIES DIRECTED DUPLEX LEFT LOWER EXTREMITY ARTERIAL STUDY TECHNIQUE: Evaluation of both lower extremities was performed at rest, including calculation of ankle-brachial indices, multiple segmental pressure evaluation, segmental Doppler and segmental pulse volume recording. Directed duplex performed of the left lower extremity COMPARISON:  None Available. FINDINGS: Right ABI:  1.05 Left ABI:  0.89 Right Lower Extremity: Segmental Doppler at the right ankle demonstrates triphasic waveforms Left Lower Extremity: Segmental Doppler at the left ankle demonstrates monophasic waveforms  Directed duplex of  the left lower extremity demonstrates triphasic waveform of the femoropopliteal system. Triphasic anterior tibial artery. Monophasic posterior tibial artery. IMPRESSION: Right: Resting ABI within normal limits. Segmental exam performed at the right ankle demonstrates waveforms relatively maintained. Left: Resting ABI in the mild range arterial occlusive disease. Segmental and directed duplex of the left lower extremity demonstrates tibial segment arterial occlusive disease. Signed, Dulcy Fanny. Nadene Rubins, RPVI [facility] [place] [facility] Electronically Signed   By: Corrie Mckusick D.O.   On: 06/03/2022 13:02       Kipper Buch M.D. [facility] 06/04/2022, 1:00 PM  Available via Epic secure chat 7am-7pm After 7 pm, please refer to night coverage provider listed on amion.

## 2022-06-04 NOTE — Progress Notes (Signed)
PODIATRY / FOOT AND ANKLE SURGERY PROGRESS NOTE  Requesting Physician: Dr. Francine Graven  Reason for consult: Left foot infection/wound  Chief Complaint: Left foot wound/infection   HPI: Ricardo Brown is a 61 y.o. male who presents today resting in bed comfortably.  Patient has had his dressing changed since yesterday and appears to be appropriate.  Patient has kept this dressing clean, dry, and intact since it was last changed has been using surgical shoe for ambulation.  Patient states that the vascular doctor saw him today and stated that his kidneys still were not in good enough shape to perform angiogram.  Discussed that it would likely be on Monday.  Patient currently denies nausea, vomiting, fevers, chills.  PMHx:  Past Medical History:  Diagnosis Date   Chronic combined systolic (congestive) and diastolic (congestive) heart failure ([place])    a. 01/2018 EF 35-40%; b. 10/2020 Echo: EF 40-45%, glob HK. RVSP 42.77mHg. Mildly dil LA. Mild MS/AoV sclerosis.   CKD (chronic kidney disease), stage Brown (HCC)    Coronary artery disease    a. 01/2018 late presenting inferior MI; b. 01/2018 Cath: Severe multivessel dzs-->CABG x 3 (LIMA->LAD, VG->OM2, VG->RPDA @ [facility]).   Diabetes mellitus without complication ([facility]    a. Dx ~ 2000.   Heart palpitations    a. Pt reports prior nl echo's and stress tests. Last stress test w/in past 2 yrs - PCP.   High cholesterol    Hypertension    Mitral regurgitation    a. 01/2018 s/p MV repair @ time of CABG.   Myocardial infarction ([facility]    Postoperative atrial fibrillation    a. 01/2018 @ time of CABG.   PSVT (paroxysmal supraventricular tachycardia)    a. Very symptomatic with multiple ED evaluations.  Has been on amio.   Severe sepsis with acute organ dysfunction ([facility] 12/19/2021   Sleep difficulties 09/07/2019   STEMI (ST elevation myocardial infarction) ([facility] 01/24/2018    Surgical Hx:  Past Surgical History:  Procedure Laterality Date   AMPUTATION  TOE Right 08/29/2021   Procedure: AMPUTATION TOE-2nd toe;  Surgeon: CSharlotte Alamo DPM;  Location: [facility];  Service: Podiatry;  Laterality: Right;   AMPUTATION TOE Left 12/22/2021   Procedure: AMPUTATION TOE-2nd Toe Partial;  Surgeon: BCaroline More DPM;  Location: [facility];  Service: Podiatry;  Laterality: Left;   CARDIAC CATHETERIZATION     CARDIAC VALVE REPLACEMENT     Mitral Valve Repair   COLONOSCOPY WITH PROPOFOL N/A 06/07/2019   Procedure: COLONOSCOPY WITH PROPOFOL;  Surgeon: TVirgel Manifold MD;  Location: [facility];  Service: Endoscopy;  Laterality: N/A;   COLONOSCOPY WITH PROPOFOL N/A 02/07/2020   Procedure: COLONOSCOPY WITH PROPOFOL;  Surgeon: TVirgel Manifold MD;  Location: [facility] END[facility];  Service: Endoscopy;  Laterality: N/A;   COLONOSCOPY WITH PROPOFOL N/A 12/11/2020   Procedure: COLONOSCOPY WITH PROPOFOL;  Surgeon: TVirgel Manifold MD;  Location: [facility];  Service: Endoscopy;  Laterality: N/A;   CORONARY ANGIOPLASTY     CORONARY ARTERY BYPASS GRAFT     x 3   01/2018   ESOPHAGOGASTRODUODENOSCOPY N/A 02/07/2020   Procedure: ESOPHAGOGASTRODUODENOSCOPY (EGD);  Surgeon: TVirgel Manifold MD;  [facility];  Service: Endoscopy;  Laterality: N/A;   LEFT HEART CATH AND CORONARY ANGIOGRAPHY N/A 01/24/2018   Procedure: LEFT HEART CATH AND CORONARY ANGIOGRAPHY;  Surgeon: AWellington Hampshire MD;  [facility];  Service: Cardiovascular;  Laterality: N/A;   SVT ABLATION N/A 09/12/2021   Procedure: SVT  ABLATION;  Surgeon: Vickie Epley, MD;  Location: [place] [facility];  Service: Cardiovascular;  Laterality: N/A;   TONSILLECTOMY      FHx:  Family History  Problem Relation Age of Onset   Cancer Mother        died @ 72   CAD Father        First MI @ 50. S/p heart transplant. Died in mid-50's of cancer.   Cancer Father    Other Brother        alive and well    Social History:  reports that he has never smoked. He has never used  smokeless tobacco. He reports that he does not currently use alcohol. He reports that he does not use drugs.  Allergies:  Allergies  Allergen Reactions   Lipitor [Atorvastatin] Palpitations    SVT   Sacubitril-Valsartan Other (See Comments)    SVT Other reaction(s): Other (see comments) Other reaction(s): Other (See Comments) SVT SVT   Pregabalin Palpitations    SVT Other reaction(s): Other (see comments) SVT SVT   Medications Prior to Admission  Medication Sig Dispense Refill   amiodarone (PACERONE) 200 MG tablet Take 200 mg by mouth daily.     aspirin 81 MG EC tablet Take 81 mg by mouth daily.      b complex vitamins tablet Take 1 tablet by mouth daily.      [EXPIRED] doxycycline (ADOXA) 100 MG tablet Take 1 tablet (100 mg total) by mouth 2 (two) times daily for 4 days. 8 tablet 0   empagliflozin (JARDIANCE) 25 MG TABS tablet Take 25 mg by mouth daily.     gabapentin (NEURONTIN) 100 MG capsule Take 1 capsule (100 mg total) by mouth 2 (two) times daily. 60 capsule 6   insulin degludec (TRESIBA) 200 UNIT/ML FlexTouch Pen 32 Units at bedtime.     losartan (COZAAR) 25 MG tablet Take 25 mg by mouth daily.     metFORMIN (GLUCOPHAGE) 500 MG tablet Take 2 tablets (1,000 mg total) by mouth 2 (two) times daily. 360 tablet 2   metolazone (ZAROXOLYN) 2.5 MG tablet Take 1 tablet (2.5 mg total) by mouth daily. 30 tablet 4   metoprolol succinate (TOPROL-XL) 50 MG 24 hr tablet Take 1 tablet (50 mg total) by mouth daily. Take with or immediately following a meal. 90 tablet 3   Omega-3 Fatty Acids (FISH OIL) 875 MG CAPS Take 875 mg by mouth 2 (two) times daily.     pramipexole (MIRAPEX) 1.5 MG tablet Take 1 tablet (1.5 mg total) by mouth 2 (two) times daily. 180 tablet 3   rosuvastatin (CRESTOR) 20 MG tablet Take 1 tablet (20 mg total) by mouth daily. 90 tablet 0   torsemide (DEMADEX) 20 MG tablet Take 2 tablets (40 mg total) by mouth daily. 40 mg daily 180 tablet 3   Dulaglutide 1.5 MG/0.5ML  SOPN Inject 1.5 mg into the skin every Saturday.     [EXPIRED] levofloxacin (LEVAQUIN) 750 MG tablet Take 1 tablet (750 mg total) by mouth every other day for 2 doses. 2 tablet 0     Physical Exam: General: Alert and oriented.  No apparent distress.  Vascular: DP/PT pulses nonpalpable bilateral, no hair growth noted to digits or foot bilateral. Dependent rubor that improves with elevation.  Neuro: Light touch sensation absent to bilateral lower extremities.  Derm: Darkened/necrotic formation present to the plantar aspect of the first metatarsal phalangeal joint area extending to the first interspace, tissue in this area appears  to be fairly fibrous and necrotic, no odor, appears to be fairly clean overall, no active purulence, reduction overall seen in erythema and edema present to the left forefoot.  MSK: Partial second toe amputations bilaterally.  Results for orders placed or performed during the hospital encounter of 06/02/22 (from the past 48 hour(s))  CBG monitoring, ED     Status: Abnormal   Collection Time: 06/02/22 12:24 PM  Result Value Ref Range   Glucose-Capillary 155 (H) 70 - 99 mg/dL    Comment: Glucose reference range applies only to samples taken after fasting for at least 8 hours.  Aerobic/Anaerobic Culture w Gram Stain (surgical/deep wound)     Status: None (Preliminary result)   Collection Time: 06/02/22  1:35 PM   Specimen: Wound  Result Value Ref Range   Specimen Description      WOUND Performed at [facility], [address] [place], [place] [address]    Special Requests      FOOT LEFT Performed at [facility], [place], [place] [address]    Gram Stain      FEW WBC PRESENT,BOTH PMN AND MONONUCLEAR FEW GRAM POSITIVE COCCI IN PAIRS    Culture      NO GROWTH < 12 HOURS Performed at [facility], [place] [address], [place], [place] [address]    Report Status PENDING   Glucose, capillary     Status: Abnormal    Collection Time: 06/02/22  4:14 PM  Result Value Ref Range   Glucose-Capillary 129 (H) 70 - 99 mg/dL    Comment: Glucose reference range applies only to samples taken after fasting for at least 8 hours.   Comment 1 Notify RN   Sedimentation rate     Status: Abnormal   Collection Time: 06/02/22  4:31 PM  Result Value Ref Range   Sed Rate 127 (H) 0 - 20 mm/hr    Comment: Performed at [facility], [address] [place], [place] [address]  C-reactive protein     Status: Abnormal   Collection Time: 06/02/22  4:31 PM  Result Value Ref Range   CRP 17.4 (H) <1.0 mg/dL    Comment: Performed at [facility], [place] [address] [place], [place] [address]  Prealbumin     Status: Abnormal   Collection Time: 06/02/22  4:31 PM  Result Value Ref Range   Prealbumin 14 (L) 18 - 38 mg/dL    Comment: Performed at [place] [address], [place], [place] [address]  Culture, blood (Routine X 2) w Reflex to ID Panel     Status: None (Preliminary result)   Collection Time: 06/02/22  4:36 PM   Specimen: BLOOD  Result Value Ref Range   Specimen Description BLOOD LEFT ANTECUBITAL    Special Requests      BOTTLES DRAWN AEROBIC AND ANAEROBIC Blood Culture adequate volume   Culture      NO GROWTH < 12 HOURS Performed at [facility], [address] [place], [place] [address]    Report Status PENDING   Glucose, capillary     Status: Abnormal   Collection Time: 06/02/22  9:23 PM  Result Value Ref Range   Glucose-Capillary 128 (H) 70 - 99 mg/dL    Comment: Glucose reference range applies only to samples taken after fasting for at least 8 hours.  CBC     Status: Abnormal   Collection Time: 06/03/22  6:33 AM  Result  Value Ref Range   WBC 10.3 4.0 - 10.5 K/uL   RBC 3.24 (L) 4.22 - 5.81 MIL/uL   Hemoglobin 9.6 (L) 13.0 - 17.0 g/dL   HCT 29.0 (L) 39.0 - 52.0 %   MCV 89.5 80.0 - 100.0 fL   MCH 29.6 26.0 - 34.0 pg   MCHC 33.1 30.0 - 36.0 g/dL   RDW 13.2 11.5 - 15.5 %    Platelets 280 150 - 400 K/uL   nRBC 0.0 0.0 - 0.2 %    Comment: Performed at [facility], [address], [place], [place] [address]  Basic metabolic panel     Status: Abnormal   Collection Time: 06/03/22  6:33 AM  Result Value Ref Range   Sodium 134 (L) 135 - 145 mmol/L   Potassium 3.3 (L) 3.5 - 5.1 mmol/L   Chloride 100 98 - 111 mmol/L   CO2 26 22 - 32 mmol/L   Glucose, Bld 117 (H) 70 - 99 mg/dL    Comment: Glucose reference range applies only to samples taken after fasting for at least 8 hours.   BUN 56 (H) 6 - 20 mg/dL   Creatinine, Ser 2.07 (H) 0.61 - 1.24 mg/dL   Calcium 8.2 (L) 8.9 - 10.3 mg/dL   GFR, Estimated 36 (L) >60 mL/min    Comment: (NOTE) Calculated using the CKD-EPI Creatinine Equation (2021)    Anion gap 8 5 - 15    Comment: Performed at [facility], [address] [place], [place] [address]  Glucose, capillary     Status: Abnormal   Collection Time: 06/03/22  8:16 AM  Result Value Ref Range   Glucose-Capillary 124 (H) 70 - 99 mg/dL    Comment: Glucose reference range applies only to samples taken after fasting for at least 8 hours.  Glucose, capillary     Status: Abnormal   Collection Time: 06/03/22  2:03 PM  Result Value Ref Range   Glucose-Capillary 161 (H) 70 - 99 mg/dL    Comment: Glucose reference range applies only to samples taken after fasting for at least 8 hours.  Glucose, capillary     Status: Abnormal   Collection Time: 06/03/22  4:26 PM  Result Value Ref Range   Glucose-Capillary 112 (H) 70 - 99 mg/dL    Comment: Glucose reference range applies only to samples taken after fasting for at least 8 hours.  Glucose, capillary     Status: Abnormal   Collection Time: 06/03/22  8:31 PM  Result Value Ref Range   Glucose-Capillary 147 (H) 70 - 99 mg/dL    Comment: Glucose reference range applies only to samples taken after fasting for at least 8 hours.   Comment 1 Notify RN   Basic metabolic panel     Status: Abnormal    Collection Time: 06/04/22  7:13 AM  Result Value Ref Range   Sodium 133 (L) 135 - 145 mmol/L   Potassium 3.7 3.5 - 5.1 mmol/L   Chloride 96 (L) 98 - 111 mmol/L   CO2 27 22 - 32 mmol/L   Glucose, Bld 183 (H) 70 - 99 mg/dL    Comment: Glucose reference range applies only to samples taken after fasting for at least 8 hours.   BUN 56 (H) 6 - 20 mg/dL   Creatinine, Ser 2.10 (H) 0.61 - 1.24 mg/dL   Calcium 8.7 (L) 8.9 - 10.3 mg/dL   GFR, Estimated 35 (L) >60 mL/min    Comment: (NOTE) Calculated using the  CKD-EPI Creatinine Equation (2021)    Anion gap 10 5 - 15    Comment: Performed at [facility], [address] [place], [place] [address]  CBC     Status: Abnormal   Collection Time: 06/04/22  7:13 AM  Result Value Ref Range   WBC 12.6 (H) 4.0 - 10.5 K/uL   RBC 3.83 (L) 4.22 - 5.81 MIL/uL   Hemoglobin 11.2 (L) 13.0 - 17.0 g/dL   HCT 33.2 (L) 39.0 - 52.0 %   MCV 86.7 80.0 - 100.0 fL   MCH 29.2 26.0 - 34.0 pg   MCHC 33.7 30.0 - 36.0 g/dL   RDW 13.2 11.5 - 15.5 %   Platelets 344 150 - 400 K/uL   nRBC 0.0 0.0 - 0.2 %    Comment: Performed at [facility], [address] [place], [place] [address]  Glucose, capillary     Status: Abnormal   Collection Time: 06/04/22  7:30 AM  Result Value Ref Range   Glucose-Capillary 178 (H) 70 - 99 mg/dL    Comment: Glucose reference range applies only to samples taken after fasting for at least 8 hours.   US ARTERIAL ABI (SCREENING LOWER EXTREMITY)  Result Date: 06/03/2022 CLINICAL DATA:  62 year old male with a history of ulcer EXAM: NONINVASIVE PHYSIOLOGIC VASCULAR STUDY OF BILATERAL LOWER EXTREMITIES DIRECTED DUPLEX LEFT LOWER EXTREMITY ARTERIAL STUDY TECHNIQUE: Evaluation of both lower extremities was performed at rest, including calculation of ankle-brachial indices, multiple segmental pressure evaluation, segmental Doppler and segmental pulse volume recording. Directed duplex performed of the left lower extremity COMPARISON:   None Available. FINDINGS: Right ABI:  1.05 Left ABI:  0.89 Right Lower Extremity: Segmental Doppler at the right ankle demonstrates triphasic waveforms Left Lower Extremity: Segmental Doppler at the left ankle demonstrates monophasic waveforms Directed duplex of the left lower extremity demonstrates triphasic waveform of the femoropopliteal system. Triphasic anterior tibial artery. Monophasic posterior tibial artery. IMPRESSION: Right: Resting ABI within normal limits. Segmental exam performed at the right ankle demonstrates waveforms relatively maintained. Left: Resting ABI in the mild range arterial occlusive disease. Segmental and directed duplex of the left lower extremity demonstrates tibial segment arterial occlusive disease. Signed, Dulcy Fanny. Nadene Rubins, RPVI [facility] Electronically Signed   By: Corrie Mckusick D.O.   On: 06/03/2022 13:02   US ARTERIAL LOWER EXTREMITY DUPLEX LEFT (NON-ABI)  Result Date: 06/03/2022 CLINICAL DATA:  61 year old male with a history of ulcer EXAM: NONINVASIVE PHYSIOLOGIC VASCULAR STUDY OF BILATERAL LOWER EXTREMITIES DIRECTED DUPLEX LEFT LOWER EXTREMITY ARTERIAL STUDY TECHNIQUE: Evaluation of both lower extremities was performed at rest, including calculation of ankle-brachial indices, multiple segmental pressure evaluation, segmental Doppler and segmental pulse volume recording. Directed duplex performed of the left lower extremity COMPARISON:  None Available. FINDINGS: Right ABI:  1.05 Left ABI:  0.89 Right Lower Extremity: Segmental Doppler at the right ankle demonstrates triphasic waveforms Left Lower Extremity: Segmental Doppler at the left ankle demonstrates monophasic waveforms Directed duplex of the left lower extremity demonstrates triphasic waveform of the femoropopliteal system. Triphasic anterior tibial artery. Monophasic posterior tibial artery. IMPRESSION: Right: Resting ABI within normal limits.  Segmental exam performed at the right ankle demonstrates waveforms relatively maintained. Left: Resting ABI in the mild range arterial occlusive disease. Segmental and directed duplex of the left lower extremity demonstrates tibial segment arterial occlusive disease. Signed, Dulcy Fanny. Nadene Rubins, [place] [facility] Electronically Signed   By: York Cerise  Earleen Newport D.O.   On: 06/03/2022 13:02    Blood pressure 108/62, pulse 68, temperature 98.5 F (36.9 C), resp. rate 18, height '5\' 8"'$  (1.727 m), weight 95.5 kg, SpO2 99 %.  Assessment Gangrene left forefoot Possible osteomyelitis left first metatarsal phalangeal joint Diabetes type 2 with polyneuropathy Severe PVD  Plan -Patient seen and examined. -X-ray imaging as well as previous MRI imaging reviewed and discussed with patient in detail.  No evidence of infection.  Appears to have large amount of vascular calcifications. -Wound examined today.  Appears to be stable at this time, no worsening necrosis present.  Capillary fill time appears to be slightly improved overall to the left big toe.  Reduction in erythema and edema present since last visit.  Still appears to have some necrotic changes to the wound at the plantar first metatarsal phalangeal joint extending to the first interspace. -Recommend obtaining a repeat MRI left foot without contrast due to concerns for deeper infection. -Betadine wet-to-dry dressing applied.  Patient have dressing changes daily.  Patient may continue ambulation with heel contact and surgical shoe. -Appreciate vascular recommendations, awaiting CT angiogram likely on Monday once kidney function improves. -Do not recommend any amputation or surgical intervention at this time until circulation is improved or if the wound/foot worsens.  At minimum will need formal incision and drainage but it is dependent upon MRI results.  Patient is at high risk for limb loss due to  ulceration, infection, diabetes, and severe PVD. -We will continue to monitor closely.  Caroline More, DPM 06/04/2022, 10:55 AM

## 2022-06-04 NOTE — Consult Note (Deleted)
PODIATRY / FOOT AND ANKLE SURGERY CONSULTATION NOTE  Requesting Physician: Dr. Francine Graven  Reason for consult: Left foot infection/wound  Chief Complaint: Left foot wound/infection   HPI: Ricardo Brown is a 61 y.o. male who presents with a nonhealing ulceration to the left hallux first webspace as well as plantar first metatarsal phalangeal joint.  Patient went to the emergency room last week after he was seen by myself and was noted to have cellulitis to the left forefoot.  Patient was treated with IV antibiotics and sent home after further work-up was performed as x-ray imaging and MRI imaging did not show any abscess or osteomyelitis.  Patient was seen in the outpatient clinic this week and was noted to have worsening necrosis of the wound with capillary fill time delayed to the toe and some darkened/discolored skin between the first interspace concerning for early stages of gangrene.  Patient also had dependent rubor that was improved with elevation to the left forefoot.  Patient currently seen in the hospital and denies nausea, vomiting, fevers, chills.  PMHx:  Past Medical History:  Diagnosis Date   Chronic combined systolic (congestive) and diastolic (congestive) heart failure ([place])    a. 01/2018 EF 35-40%; b. 10/2020 Echo: EF 40-45%, glob HK. RVSP 42.11mHg. Mildly dil LA. Mild MS/AoV sclerosis.   CKD (chronic kidney disease), stage Brown (HCC)    Coronary artery disease    a. 01/2018 late presenting inferior MI; b. 01/2018 Cath: Severe multivessel dzs-->CABG x 3 (LIMA->LAD, VG->OM2, VG->RPDA @ [facility]).   Diabetes mellitus without complication ([facility]    a. Dx ~ 2000.   Heart palpitations    a. Pt reports prior nl echo's and stress tests. Last stress test w/in past 2 yrs - PCP.   High cholesterol    Hypertension    Mitral regurgitation    a. 01/2018 s/p MV repair @ time of CABG.   Myocardial infarction ([facility]    Postoperative atrial fibrillation    a. 01/2018 @ time of CABG.   PSVT  (paroxysmal supraventricular tachycardia)    a. Very symptomatic with multiple ED evaluations.  Has been on amio.   Severe sepsis with acute organ dysfunction ([place] 12/19/2021   Sleep difficulties 09/07/2019   STEMI (ST elevation myocardial infarction) ([facility] 01/24/2018    Surgical Hx:  Past Surgical History:  Procedure Laterality Date   AMPUTATION TOE Right 08/29/2021   Procedure: AMPUTATION TOE-2nd toe;  Surgeon: CSharlotte Alamo DPM;  Location: [facility] ORS;  Service: Podiatry;  Laterality: Right;   AMPUTATION TOE Left 12/22/2021   Procedure: AMPUTATION TOE-2nd Toe Partial;  Surgeon: BCaroline More DPM;  Location: [facility] ORS;  Service: Podiatry;  Laterality: Left;   CARDIAC CATHETERIZATION     CARDIAC VALVE REPLACEMENT     Mitral Valve Repair   COLONOSCOPY WITH PROPOFOL N/A 06/07/2019   Procedure: COLONOSCOPY WITH PROPOFOL;  Surgeon: TVirgel Manifold MD;  Location: [facility];  Service: Endoscopy;  Laterality: N/A;   COLONOSCOPY WITH PROPOFOL N/A 02/07/2020   Procedure: COLONOSCOPY WITH PROPOFOL;  Surgeon: TVirgel Manifold MD;  Location: [facility] ENDOS[facility];  Service: Endoscopy;  Laterality: N/A;   COLONOSCOPY WITH PROPOFOL N/A 12/11/2020   Procedure: COLONOSCOPY WITH PROPOFOL;  Surgeon: TVirgel Manifold MD;  Location: [facility] ENDOSCOPY;  Service: Endoscopy;  Laterality: N/A;   CORONARY ANGIOPLASTY     CORONARY ARTERY BYPASS GRAFT     x 3   01/2018   ESOPHAGOGASTRODUODENOSCOPY N/A 02/07/2020   Procedure: ESOPHAGOGASTRODUODENOSCOPY (EGD);  Surgeon: TVirgel Manifold  MD;  Location: [facility] ENDOS[facility];  Service: Endoscopy;  Laterality: N/A;   LEFT HEART CATH AND CORONARY ANGIOGRAPHY N/A 01/24/2018   Procedure: LEFT HEART CATH AND CORONARY ANGIOGRAPHY;  Surgeon: [name], MD;  Location: [place] CV LAB;  Service: Cardiovascular;  Laterality: N/A;   SVT ABLATION N/A 09/12/2021   Procedure: SVT ABLATION;  Surgeon: Vickie Epley, MD;  Location: [place] CV LAB;  Service:  Cardiovascular;  Laterality: N/A;   TONSILLECTOMY      FHx:  Family History  Problem Relation Age of Onset   Cancer Mother        died @ 47   CAD Father        First MI @ 64. S/p heart transplant. Died in mid-50's of cancer.   Cancer Father    Other Brother        alive and well    Social History:  reports that he has never smoked. He has never used smokeless tobacco. He reports that he does not currently use alcohol. He reports that he does not use drugs.  Allergies:  Allergies  Allergen Reactions   Lipitor [Atorvastatin] Palpitations    SVT   Sacubitril-Valsartan Other (See Comments)    SVT Other reaction(s): Other (see comments) Other reaction(s): Other (See Comments) SVT SVT   Pregabalin Palpitations    SVT Other reaction(s): Other (see comments) SVT SVT   Medications Prior to Admission  Medication Sig Dispense Refill   amiodarone (PACERONE) 200 MG tablet Take 200 mg by mouth daily.     aspirin 81 MG EC tablet Take 81 mg by mouth daily.      b complex vitamins tablet Take 1 tablet by mouth daily.      [EXPIRED] doxycycline (ADOXA) 100 MG tablet Take 1 tablet (100 mg total) by mouth 2 (two) times daily for 4 days. 8 tablet 0   empagliflozin (JARDIANCE) 25 MG TABS tablet Take 25 mg by mouth daily.     gabapentin (NEURONTIN) 100 MG capsule Take 1 capsule (100 mg total) by mouth 2 (two) times daily. 60 capsule 6   insulin degludec (TRESIBA) 200 UNIT/ML FlexTouch Pen 32 Units at bedtime.     losartan (COZAAR) 25 MG tablet Take 25 mg by mouth daily.     metFORMIN (GLUCOPHAGE) 500 MG tablet Take 2 tablets (1,000 mg total) by mouth 2 (two) times daily. 360 tablet 2   metolazone (ZAROXOLYN) 2.5 MG tablet Take 1 tablet (2.5 mg total) by mouth daily. 30 tablet 4   metoprolol succinate (TOPROL-XL) 50 MG 24 hr tablet Take 1 tablet (50 mg total) by mouth daily. Take with or immediately following a meal. 90 tablet 3   Omega-3 Fatty Acids (FISH OIL) 875 MG CAPS Take 875 mg by  mouth 2 (two) times daily.     pramipexole (MIRAPEX) 1.5 MG tablet Take 1 tablet (1.5 mg total) by mouth 2 (two) times daily. 180 tablet 3   rosuvastatin (CRESTOR) 20 MG tablet Take 1 tablet (20 mg total) by mouth daily. 90 tablet 0   torsemide (DEMADEX) 20 MG tablet Take 2 tablets (40 mg total) by mouth daily. 40 mg daily 180 tablet 3   Dulaglutide 1.5 MG/0.5ML SOPN Inject 1.5 mg into the skin every Saturday.     [EXPIRED] levofloxacin (LEVAQUIN) 750 MG tablet Take 1 tablet (750 mg total) by mouth every other day for 2 doses. 2 tablet 0     Physical Exam: General: Alert and oriented.  No apparent  distress.  Vascular: DP/PT pulses nonpalpable bilateral, no hair growth noted to digits or foot bilateral.  Erythema and edema present to the left forefoot, dependent rubor that improves with elevation.  Neuro: Light touch sensation absent to bilateral lower extremities.  Derm: Darkened/necrotic skin formation present to the plantar aspect of the first metatarsal phalangeal joint extending to the first interspace, once this area was opened it did reveal a wound that probes very close to bone to the first metatarsal phalangeal joint, no gas or odor present, minimal bleeding.  Concern for worsening necrosis of the left forefoot.  MSK: Partial second toe amputations bilaterally.  Results for orders placed or performed during the hospital encounter of 06/02/22 (from the past 48 hour(s))  CBG monitoring, ED     Status: Abnormal   Collection Time: 06/02/22 12:24 PM  Result Value Ref Range   Glucose-Capillary 155 (H) 70 - 99 mg/dL    Comment: Glucose reference range applies only to samples taken after fasting for at least 8 hours.  Aerobic/Anaerobic Culture w Gram Stain (surgical/deep wound)     Status: None (Preliminary result)   Collection Time: 06/02/22  1:35 PM   Specimen: Wound  Result Value Ref Range   Specimen Description      WOUND Performed at [facility], [address]  [place], [place] [address]    Special Requests      FOOT LEFT Performed at [facility], [place], [place] [address]    Gram Stain      FEW WBC PRESENT,BOTH PMN AND MONONUCLEAR FEW GRAM POSITIVE COCCI IN PAIRS    Culture      NO GROWTH < 12 HOURS Performed at [facility], [place] [address], [place], [place] [address]    Report Status PENDING   Glucose, capillary     Status: Abnormal   Collection Time: 06/02/22  4:14 PM  Result Value Ref Range   Glucose-Capillary 129 (H) 70 - 99 mg/dL    Comment: Glucose reference range applies only to samples taken after fasting for at least 8 hours.   Comment 1 Notify RN   Sedimentation rate     Status: Abnormal   Collection Time: 06/02/22  4:31 PM  Result Value Ref Range   Sed Rate 127 (H) 0 - 20 mm/hr    Comment: Performed at [facility], [address] [place], [place] [address]  C-reactive protein     Status: Abnormal   Collection Time: 06/02/22  4:31 PM  Result Value Ref Range   CRP 17.4 (H) <1.0 mg/dL    Comment: Performed at [facility], [place] [address] [place], [place] [address]  Prealbumin     Status: Abnormal   Collection Time: 06/02/22  4:31 PM  Result Value Ref Range   Prealbumin 14 (L) 18 - 38 mg/dL    Comment: Performed at [facility] [address] [place], [place] [address]  Culture, blood (Routine X 2) w Reflex to ID Panel     Status: None (Preliminary result)   Collection Time: 06/02/22  4:36 PM   Specimen: BLOOD  Result Value Ref Range   Specimen Description BLOOD LEFT ANTECUBITAL    Special Requests      BOTTLES DRAWN AEROBIC AND ANAEROBIC Blood Culture adequate volume   Culture      NO GROWTH < 12 HOURS Performed at [facility], [address], [place], [place] [address]    Report  Status PENDING   Glucose, capillary     Status: Abnormal   Collection Time: 06/02/22  9:23 PM  Result Value Ref Range   Glucose-Capillary 128 (H) 70 - 99 mg/dL    Comment:  Glucose reference range applies only to samples taken after fasting for at least 8 hours.  CBC     Status: Abnormal   Collection Time: 06/03/22  6:33 AM  Result Value Ref Range   WBC 10.3 4.0 - 10.5 K/uL   RBC 3.24 (L) 4.22 - 5.81 MIL/uL   Hemoglobin 9.6 (L) 13.0 - 17.0 g/dL   HCT 29.0 (L) 39.0 - 52.0 %   MCV 89.5 80.0 - 100.0 fL   MCH 29.6 26.0 - 34.0 pg   MCHC 33.1 30.0 - 36.0 g/dL   RDW 13.2 11.5 - 15.5 %   Platelets 280 150 - 400 K/uL   nRBC 0.0 0.0 - 0.2 %    Comment: Performed at [facility], [address], [place], [place] [address]  Basic metabolic panel     Status: Abnormal   Collection Time: 06/03/22  6:33 AM  Result Value Ref Range   Sodium 134 (L) 135 - 145 mmol/L   Potassium 3.3 (L) 3.5 - 5.1 mmol/L   Chloride 100 98 - 111 mmol/L   CO2 26 22 - 32 mmol/L   Glucose, Bld 117 (H) 70 - 99 mg/dL    Comment: Glucose reference range applies only to samples taken after fasting for at least 8 hours.   BUN 56 (H) 6 - 20 mg/dL   Creatinine, Ser 2.07 (H) 0.61 - 1.24 mg/dL   Calcium 8.2 (L) 8.9 - 10.3 mg/dL   GFR, Estimated 36 (L) >60 mL/min    Comment: (NOTE) Calculated using the CKD-EPI Creatinine Equation (2021)    Anion gap 8 5 - 15    Comment: Performed at [facility], [address] [place], [place] [address]  Glucose, capillary     Status: Abnormal   Collection Time: 06/03/22  8:16 AM  Result Value Ref Range   Glucose-Capillary 124 (H) 70 - 99 mg/dL    Comment: Glucose reference range applies only to samples taken after fasting for at least 8 hours.  Glucose, capillary     Status: Abnormal   Collection Time: 06/03/22  2:03 PM  Result Value Ref Range   Glucose-Capillary 161 (H) 70 - 99 mg/dL    Comment: Glucose reference range applies only to samples taken after fasting for at least 8 hours.  Glucose, capillary     Status: Abnormal   Collection Time: 06/03/22  4:26 PM  Result Value Ref Range   Glucose-Capillary 112 (H) 70 - 99 mg/dL     Comment: Glucose reference range applies only to samples taken after fasting for at least 8 hours.  Glucose, capillary     Status: Abnormal   Collection Time: 06/03/22  8:31 PM  Result Value Ref Range   Glucose-Capillary 147 (H) 70 - 99 mg/dL    Comment: Glucose reference range applies only to samples taken after fasting for at least 8 hours.   Comment 1 Notify RN   Basic metabolic panel     Status: Abnormal   Collection Time: 06/04/22  7:13 AM  Result Value Ref Range   Sodium 133 (L) 135 - 145 mmol/L   Potassium 3.7 3.5 - 5.1 mmol/L   Chloride 96 (L) 98 - 111 mmol/L   CO2 27 22 - 32 mmol/L  Glucose, Bld 183 (H) 70 - 99 mg/dL    Comment: Glucose reference range applies only to samples taken after fasting for at least 8 hours.   BUN 56 (H) 6 - 20 mg/dL   Creatinine, Ser 2.10 (H) 0.61 - 1.24 mg/dL   Calcium 8.7 (L) 8.9 - 10.3 mg/dL   GFR, Estimated 35 (L) >60 mL/min    Comment: (NOTE) Calculated using the CKD-EPI Creatinine Equation (2021)    Anion gap 10 5 - 15    Comment: Performed at [facility], [address] [place], [place] [address]  CBC     Status: Abnormal   Collection Time: 06/04/22  7:13 AM  Result Value Ref Range   WBC 12.6 (H) 4.0 - 10.5 K/uL   RBC 3.83 (L) 4.22 - 5.81 MIL/uL   Hemoglobin 11.2 (L) 13.0 - 17.0 g/dL   HCT 33.2 (L) 39.0 - 52.0 %   MCV 86.7 80.0 - 100.0 fL   MCH 29.2 26.0 - 34.0 pg   MCHC 33.7 30.0 - 36.0 g/dL   RDW 13.2 11.5 - 15.5 %   Platelets 344 150 - 400 K/uL   nRBC 0.0 0.0 - 0.2 %    Comment: Performed at [facility], [address] [place], [place] [address]  Glucose, capillary     Status: Abnormal   Collection Time: 06/04/22  7:30 AM  Result Value Ref Range   Glucose-Capillary 178 (H) 70 - 99 mg/dL    Comment: Glucose reference range applies only to samples taken after fasting for at least 8 hours.   US ARTERIAL ABI (SCREENING LOWER EXTREMITY)  Result Date: 06/03/2022 CLINICAL DATA:  61 year old male with a  history of ulcer EXAM: NONINVASIVE PHYSIOLOGIC VASCULAR STUDY OF BILATERAL LOWER EXTREMITIES DIRECTED DUPLEX LEFT LOWER EXTREMITY ARTERIAL STUDY TECHNIQUE: Evaluation of both lower extremities was performed at rest, including calculation of ankle-brachial indices, multiple segmental pressure evaluation, segmental Doppler and segmental pulse volume recording. Directed duplex performed of the left lower extremity COMPARISON:  None Available. FINDINGS: Right ABI:  1.05 Left ABI:  0.89 Right Lower Extremity: Segmental Doppler at the right ankle demonstrates triphasic waveforms Left Lower Extremity: Segmental Doppler at the left ankle demonstrates monophasic waveforms Directed duplex of the left lower extremity demonstrates triphasic waveform of the femoropopliteal system. Triphasic anterior tibial artery. Monophasic posterior tibial artery. IMPRESSION: Right: Resting ABI within normal limits. Segmental exam performed at the right ankle demonstrates waveforms relatively maintained. Left: Resting ABI in the mild range arterial occlusive disease. Segmental and directed duplex of the left lower extremity demonstrates tibial segment arterial occlusive disease. Signed, Dulcy Fanny. Nadene Rubins, RPVI [facility] Radiology Electronically Signed   By: Corrie Mckusick D.O.   On: 06/03/2022 13:02   US ARTERIAL LOWER EXTREMITY DUPLEX LEFT (NON-ABI)  Result Date: 06/03/2022 CLINICAL DATA:  61 year old male with a history of ulcer EXAM: NONINVASIVE PHYSIOLOGIC VASCULAR STUDY OF BILATERAL LOWER EXTREMITIES DIRECTED DUPLEX LEFT LOWER EXTREMITY ARTERIAL STUDY TECHNIQUE: Evaluation of both lower extremities was performed at rest, including calculation of ankle-brachial indices, multiple segmental pressure evaluation, segmental Doppler and segmental pulse volume recording. Directed duplex performed of the left lower extremity COMPARISON:  None Available. FINDINGS: Right ABI:  1.05 Left  ABI:  0.89 Right Lower Extremity: Segmental Doppler at the right ankle demonstrates triphasic waveforms Left Lower Extremity: Segmental Doppler at the left ankle demonstrates monophasic waveforms Directed duplex of the left lower extremity demonstrates triphasic waveform of the femoropopliteal system. Triphasic  anterior tibial artery. Monophasic posterior tibial artery. IMPRESSION: Right: Resting ABI within normal limits. Segmental exam performed at the right ankle demonstrates waveforms relatively maintained. Left: Resting ABI in the mild range arterial occlusive disease. Segmental and directed duplex of the left lower extremity demonstrates tibial segment arterial occlusive disease. Signed, Dulcy Fanny. Nadene Rubins, RPVI [facility] Radiology Electronically Signed   By: Corrie Mckusick D.O.   On: 06/03/2022 13:02    Blood pressure 108/62, pulse 68, temperature 98.5 F (36.9 C), resp. rate 18, height _0  (1.727 m), weight 95.5 kg, SpO2 99 %.  Assessment Gangrene left forefoot Possible osteomyelitis left first metatarsal phalangeal joint Diabetes type 2 with polyneuropathy Severe PVD  Plan -Patient seen and examined. -X-ray imaging as well as previous MRI imaging reviewed and discussed with patient in detail.  No evidence of infection.  Appears to have large amount of vascular calcifications. -After discussion was performed with patient patient elected for bedside incision and drainage, verbal consent obtained.  After prep was performed with Betadine performed a incision and drainage to the first webspace area with 15 blade and suture removal kit into the subcutaneous tissue.  No purulence was expressed but there was notable air to the area, this area also probe to bone in this particular area.  The bone did appear to have a hard stop.  Minimal to no bleeding occurred.  This wound in this area appears to probe down to the plantar first metatarsal  phalangeal joint area as it appears to be continuous and undermining. -Wound culture was taken and sent off. -Betadine wet-to-dry dressing applied.  Patient have dressing changes daily.  Patient may continue ambulation with heel contact and surgical shoe. -Consult placed with vascular surgery.  Recommend urgent CT angiogram within the next couple of days as patient appears to have progressively worsened left forefoot ischemia. -Do not recommend any amputation at this time until circulation is improved.  Patient is at high risk for limb loss due to ulceration, infection, diabetes, and severe PVD. -At minimum at this point patient will likely need a partial first ray amputation but could need transmetatarsal amputation depending on circulation improvement. -We will continue to monitor closely.  Caroline More, DPM 06/04/2022, 10:54 AM

## 2022-06-04 NOTE — Plan of Care (Signed)

## 2022-06-04 NOTE — Progress Notes (Signed)
[facility]  Daily Progress Note   Subjective  -   Discussed noninvasive studies with patient today.  The patient has decreased ABIs in the left lower extremity of 0.89.  We suspect this may be higher than noted due to medial calcification.  Arterial duplex shows triphasic waveforms throughout the femoral-popliteal system however he transitions to monophasic tibial artery waveforms in the posterior tibial artery which is important for healing of the dorsal surface.  All the patient's creatinine has begun to decrease it is still more elevated than we would like prior to intervention. Objective Vitals:   06/03/22 0829 06/04/22 0007 06/04/22 0729 06/04/22 1536  BP: 126/68 104/63 108/62 (!) 104/58  Pulse: 65 68 68 61  Resp: '14 17 18 16  '$ Temp: 98.4 F (36.9 C) 98 F (36.7 C) 98.5 F (36.9 C) 98.3 F (36.8 C)  TempSrc:      SpO2: 100% 99% 99% 100%  Weight:      Height:        Intake/Output Summary (Last 24 hours) at 06/04/2022 2330 Last data filed at 06/04/2022 1600 Gross per 24 hour  Intake 240 ml  Output 1200 ml  Net -960 ml    PULM  CTAB CV  RRR VASC  unable to assess this left foot due to dressings  Laboratory CBC    Component Value Date/Time   WBC 12.6 (H) 06/04/2022 0713   HGB 11.2 (L) 06/04/2022 0713   HGB 13.0 08/20/2021 0632   HCT 33.2 (L) 06/04/2022 0713   HCT 38.0 08/20/2021 0632   PLT 344 06/04/2022 0713   PLT 197 08/20/2021 0632    BMET    Component Value Date/Time   NA 133 (L) 06/04/2022 0713   NA 136 08/26/2021 0952   K 3.7 06/04/2022 0713   CL 96 (L) 06/04/2022 0713   CO2 27 06/04/2022 0713   GLUCOSE 183 (H) 06/04/2022 0713   BUN 56 (H) 06/04/2022 0713   BUN 69 (H) 08/26/2021 0952   CREATININE 2.10 (H) 06/04/2022 0713   CREATININE 1.31 09/30/2020 1114   CALCIUM 8.7 (L) 06/04/2022 0713   GFRNONAA 35 (L) 06/04/2022 0713   GFRNONAA 59 (L) 09/30/2020 1114   GFRAA 69 09/30/2020 1114    Assessment/Planning: I discussed  with the patient and his wife regarding his noninvasive test results as well as ongoing plan.  The patient continues to have an elevation in his creatinine beyond his baseline.  While this is better than previous, we would like to rule out to decrease further prior to angiogram of the lower extremities.  We recommend avoiding nephrotoxic agents.  As the patient's creatinine begins to trend down his angiogram can be performed on an outpatient basis or if he is still hospitalized we can plan on Monday. Kris Hartmann  06/04/2022, 11:30 PM

## 2022-06-05 ENCOUNTER — Inpatient Hospital Stay: Payer: Medicare Other | Admitting: Anesthesiology

## 2022-06-05 ENCOUNTER — Encounter
Admission: EM | Disposition: A | Discharge status: Home / Self Care | Payer: Self-pay | Source: Home / Self Care | Attending: Internal Medicine

## 2022-06-05 ENCOUNTER — Encounter: Payer: Self-pay | Admitting: Internal Medicine

## 2022-06-05 DIAGNOSIS — E11628 Type 2 diabetes mellitus with other skin complications: Secondary | ICD-10-CM | POA: Diagnosis not present

## 2022-06-05 DIAGNOSIS — L089 Local infection of the skin and subcutaneous tissue, unspecified: Secondary | ICD-10-CM | POA: Diagnosis not present

## 2022-06-05 DIAGNOSIS — L03116 Cellulitis of left lower limb: Secondary | ICD-10-CM | POA: Diagnosis not present

## 2022-06-05 DIAGNOSIS — E1152 Type 2 diabetes mellitus with diabetic peripheral angiopathy with gangrene: Secondary | ICD-10-CM | POA: Diagnosis not present

## 2022-06-05 HISTORY — PX: INCISION AND DRAINAGE: SHX5863

## 2022-06-05 LAB — GLUCOSE, CAPILLARY
Glucose-Capillary: 141 mg/dL — ABNORMAL HIGH (ref 70–99)
Glucose-Capillary: 112 mg/dL — ABNORMAL HIGH (ref 70–99)
Glucose-Capillary: 129 mg/dL — ABNORMAL HIGH (ref 70–99)
Glucose-Capillary: 117 mg/dL — ABNORMAL HIGH (ref 70–99)
Glucose-Capillary: 193 mg/dL — ABNORMAL HIGH (ref 70–99)

## 2022-06-05 LAB — BASIC METABOLIC PANEL
Anion gap: 8 (ref 5–15)
BUN: 56 mg/dL — ABNORMAL HIGH (ref 6–20)
CO2: 27 mmol/L (ref 22–32)
Calcium: 8.9 mg/dL (ref 8.9–10.3)
Chloride: 99 mmol/L (ref 98–111)
Creatinine, Ser: 1.69 mg/dL — ABNORMAL HIGH (ref 0.61–1.24)
GFR, Estimated: 46 mL/min — ABNORMAL LOW (ref >60)
Glucose, Bld: 175 mg/dL — ABNORMAL HIGH (ref 70–99)
Potassium: 4 mmol/L (ref 3.5–5.1)
Sodium: 134 mmol/L — ABNORMAL LOW (ref 135–145)

## 2022-06-05 LAB — SURGICAL PCR SCREEN
MRSA, PCR: NEGATIVE
Staphylococcus aureus: NEGATIVE

## 2022-06-05 LAB — CBC
HCT: 31 % — ABNORMAL LOW (ref 39.0–52.0)
Hemoglobin: 10.5 g/dL — ABNORMAL LOW (ref 13.0–17.0)
MCH: 29.6 pg (ref 26.0–34.0)
MCHC: 33.9 g/dL (ref 30.0–36.0)
MCV: 87.3 fL (ref 80.0–100.0)
Platelets: 343 10*3/uL (ref 150–400)
RBC: 3.55 MIL/uL — ABNORMAL LOW (ref 4.22–5.81)
RDW: 13 % (ref 11.5–15.5)
WBC: 12.5 10*3/uL — ABNORMAL HIGH (ref 4.0–10.5)
nRBC: 0 % (ref 0.0–0.2)

## 2022-06-05 SURGERY — INCISION AND DRAINAGE
Anesthesia: Monitor Anesthesia Care | Laterality: Left

## 2022-06-05 MED ORDER — DEXTROSE-NACL 5-0.45 % IV SOLN: INTRAVENOUS | Status: AC

## 2022-06-05 MED ORDER — MUPIROCIN 2 % EX OINT
1.0000 | TOPICAL_OINTMENT | Freq: Two times a day (BID) | CUTANEOUS | Status: DC
  Administered 2022-06-05 – 2022-06-06 (×2): 1 via NASAL
  Filled 2022-06-05: qty 22

## 2022-06-05 MED ORDER — MIDAZOLAM HCL 2 MG/2ML IJ SOLN
INTRAMUSCULAR | Status: AC
  Filled 2022-06-05: qty 2

## 2022-06-05 MED ORDER — PROPOFOL 10 MG/ML IV BOLUS
INTRAVENOUS | Status: AC
  Filled 2022-06-05: qty 20

## 2022-06-05 MED ORDER — BUPIVACAINE HCL (PF) 0.5 % IJ SOLN
INTRAMUSCULAR | Status: AC
  Filled 2022-06-05: qty 30

## 2022-06-05 MED ORDER — VANCOMYCIN HCL 1000 MG IV SOLR
INTRAVENOUS | Status: AC
  Filled 2022-06-05: qty 20

## 2022-06-05 MED ORDER — LIDOCAINE HCL (PF) 1 % IJ SOLN
INTRAMUSCULAR | Status: AC
  Filled 2022-06-05: qty 30

## 2022-06-05 MED ORDER — 0.9 % SODIUM CHLORIDE (POUR BTL) OPTIME
TOPICAL | Status: DC | PRN
  Administered 2022-06-05: 500 mL

## 2022-06-05 MED ORDER — PROPOFOL 500 MG/50ML IV EMUL
INTRAVENOUS | Status: DC | PRN
  Administered 2022-06-05: 100 ug/kg/min via INTRAVENOUS

## 2022-06-05 MED ORDER — SODIUM CHLORIDE 0.9 % IV SOLN
3.0000 g | Freq: Four times a day (QID) | INTRAVENOUS | Status: DC
  Filled 2022-06-05 (×2): qty 8

## 2022-06-05 MED ORDER — SODIUM CHLORIDE 0.9 % IR SOLN
Status: DC | PRN
  Administered 2022-06-05: 3000 mL

## 2022-06-05 MED ORDER — PROPOFOL 10 MG/ML IV BOLUS
INTRAVENOUS | Status: DC | PRN
  Administered 2022-06-05: 40 mg via INTRAVENOUS

## 2022-06-05 MED ORDER — POVIDONE-IODINE 7.5 % EX SOLN: Freq: Once | CUTANEOUS | Status: AC

## 2022-06-05 MED ORDER — VANCOMYCIN HCL 1500 MG/300ML IV SOLN
1500.0000 mg | INTRAVENOUS | Status: DC
  Administered 2022-06-05: 1500 mg via INTRAVENOUS
  Filled 2022-06-05: qty 300

## 2022-06-05 MED ORDER — SODIUM CHLORIDE 0.9 % IV SOLN
3.0000 g | Freq: Four times a day (QID) | INTRAVENOUS | Status: DC
  Administered 2022-06-05 – 2022-06-09 (×15): 3 g via INTRAVENOUS
  Filled 2022-06-05: qty 3
  Filled 2022-06-05 (×2): qty 8
  Filled 2022-06-05: qty 3
  Filled 2022-06-05 (×10): qty 8
  Filled 2022-06-05 (×2): qty 3
  Filled 2022-06-05: qty 8
  Filled 2022-06-05: qty 3

## 2022-06-05 MED ORDER — VANCOMYCIN HCL 1000 MG IV SOLR
INTRAVENOUS | Status: DC | PRN
  Administered 2022-06-05: 1000 mg

## 2022-06-05 MED ORDER — FENTANYL CITRATE (PF) 100 MCG/2ML IJ SOLN: 25.0000 ug | INTRAMUSCULAR | Status: DC | PRN

## 2022-06-05 MED ORDER — MIDAZOLAM HCL 2 MG/2ML IJ SOLN
INTRAMUSCULAR | Status: DC | PRN
  Administered 2022-06-05: 2 mg via INTRAVENOUS

## 2022-06-05 MED ORDER — SODIUM CHLORIDE 0.9 % IV SOLN: INTRAVENOUS | Status: DC | PRN

## 2022-06-05 MED ORDER — BUPIVACAINE HCL (PF) 0.5 % IJ SOLN
INTRAMUSCULAR | Status: DC | PRN
  Administered 2022-06-05: 20 mL

## 2022-06-05 MED ORDER — ONDANSETRON HCL 4 MG/2ML IJ SOLN: 4.0000 mg | Freq: Once | INTRAMUSCULAR | Status: DC | PRN

## 2022-06-05 SURGICAL SUPPLY — 54 items
BAG COUNTER SPONGE SURGICOUNT (BAG) ×1 IMPLANT
BLADE OSC/SAGITTAL MD 5.5X18 (BLADE) IMPLANT
BLADE OSCILLATING/SAGITTAL (BLADE)
BLADE SW THK.38XMED LNG THN (BLADE) IMPLANT
BNDG ELASTIC 3X5.8 VLCR NS LF (GAUZE/BANDAGES/DRESSINGS) IMPLANT
BNDG ELASTIC 4X5.8 VLCR NS LF (GAUZE/BANDAGES/DRESSINGS) IMPLANT
BNDG ELASTIC 4X5.8 VLCR STR LF (GAUZE/BANDAGES/DRESSINGS) IMPLANT
BNDG ESMARK 4X12 TAN STRL LF (GAUZE/BANDAGES/DRESSINGS) ×1 IMPLANT
BNDG GAUZE DERMACEA FLUFF 4 (GAUZE/BANDAGES/DRESSINGS) IMPLANT
BNDG STRETCH GAUZE 3IN X12FT (GAUZE/BANDAGES/DRESSINGS) IMPLANT
CNTNR SPEC 2.5X3XGRAD LEK (MISCELLANEOUS) ×1
CONT SPEC 4OZ STER OR WHT (MISCELLANEOUS) ×1
CONTAINER SPEC 2.5X3XGRAD LEK (MISCELLANEOUS) IMPLANT
DRSG XEROFORM 1X8 (GAUZE/BANDAGES/DRESSINGS) IMPLANT
DURAPREP 26ML APPLICATOR (WOUND CARE) ×1 IMPLANT
ELECT REM PT RETURN 9FT ADLT (ELECTROSURGICAL) ×1
ELECTRODE REM PT RTRN 9FT ADLT (ELECTROSURGICAL) ×1 IMPLANT
GAUZE PACKING 0.25INX5YD STRL (GAUZE/BANDAGES/DRESSINGS) IMPLANT
GAUZE SPONGE 4X4 12PLY STRL (GAUZE/BANDAGES/DRESSINGS) ×1 IMPLANT
GAUZE STRETCH 2X75IN STRL (MISCELLANEOUS) IMPLANT
GLOVE BIO SURGEON STRL SZ7 (GLOVE) ×1 IMPLANT
GLOVE SURG UNDER LTX SZ7.5 (GLOVE) ×1 IMPLANT
GOWN STRL REUS W/ TWL LRG LVL3 (GOWN DISPOSABLE) ×2 IMPLANT
GOWN STRL REUS W/TWL LRG LVL3 (GOWN DISPOSABLE) ×2
IV NS 1000ML (IV SOLUTION)
IV NS 1000ML BAXH (IV SOLUTION) IMPLANT
IV NS IRRIG 3000ML ARTHROMATIC (IV SOLUTION) IMPLANT
KIT TURNOVER KIT A (KITS) ×1 IMPLANT
LABEL OR SOLS (LABEL) IMPLANT
MANIFOLD NEPTUNE II (INSTRUMENTS) ×1 IMPLANT
NDL HYPO 25X1 1.5 SAFETY (NEEDLE) ×2 IMPLANT
NEEDLE HYPO 25X1 1.5 SAFETY (NEEDLE) ×2 IMPLANT
NS IRRIG 500ML POUR BTL (IV SOLUTION) ×1 IMPLANT
PACK EXTREMITY ARMC (MISCELLANEOUS) ×1 IMPLANT
PACKING GAUZE IODOFORM 1INX5YD (GAUZE/BANDAGES/DRESSINGS) IMPLANT
PAD ABD DERMACEA PRESS 5X9 (GAUZE/BANDAGES/DRESSINGS) IMPLANT
PULSAVAC PLUS IRRIG FAN TIP (DISPOSABLE)
RASP SM TEAR CROSS CUT (RASP) IMPLANT
SOL PREP PVP 2OZ (MISCELLANEOUS)
SOLUTION PREP PVP 2OZ (MISCELLANEOUS) IMPLANT
STOCKINETTE STRL 6IN 960660 (GAUZE/BANDAGES/DRESSINGS) ×1 IMPLANT
SUT ETHILON 3-0 FS-10 30 BLK (SUTURE) ×2
SUT ETHILON 4-0 (SUTURE)
SUT ETHILON 4-0 FS2 18XMFL BLK (SUTURE)
SUT VIC AB 3-0 SH 27 (SUTURE) ×1
SUT VIC AB 3-0 SH 27X BRD (SUTURE) IMPLANT
SUT VIC AB 4-0 FS2 27 (SUTURE) IMPLANT
SUTURE EHLN 3-0 FS-10 30 BLK (SUTURE) IMPLANT
SUTURE ETHLN 4-0 FS2 18XMF BLK (SUTURE) IMPLANT
SWAB CULTURE AMIES ANAERIB BLU (MISCELLANEOUS) IMPLANT
SYR 10ML LL (SYRINGE) ×1 IMPLANT
TIP FAN IRRIG PULSAVAC PLUS (DISPOSABLE) IMPLANT
TRAP FLUID SMOKE EVACUATOR (MISCELLANEOUS) ×1 IMPLANT
WATER STERILE IRR 500ML POUR (IV SOLUTION) ×1 IMPLANT

## 2022-06-05 NOTE — Plan of Care (Signed)
  Problem: Education: Goal: Knowledge of General Education information will improve Description: Including pain rating scale, medication(s)/side effects and non-pharmacologic comfort measures Outcome: Progressing   Problem: Health Behavior/Discharge Planning: Goal: Ability to manage health-related needs will improve Outcome: Progressing   Problem: Clinical Measurements: Goal: Ability to maintain clinical measurements within normal limits will improve Outcome: Progressing Goal: Diagnostic test results will improve Outcome: Progressing Goal: Respiratory complications will improve Outcome: Progressing Goal: Cardiovascular complication will be avoided Outcome: Progressing   Problem: Activity: Goal: Risk for activity intolerance will decrease Outcome: Progressing   Problem: Nutrition: Goal: Adequate nutrition will be maintained Outcome: Progressing   Problem: Coping: Goal: Level of anxiety will decrease Outcome: Progressing   Problem: Elimination: Goal: Will not experience complications related to bowel motility Outcome: Progressing Goal: Will not experience complications related to urinary retention Outcome: Progressing   Problem: Pain Managment: Goal: General experience of comfort will improve Outcome: Progressing

## 2022-06-05 NOTE — Transfer of Care (Signed)
Immediate Anesthesia Transfer of Care Note  Patient: Ricardo Brown  Procedure(s) Performed: INCISION AND DRAINAGE (Left)  Patient Location: PACU  Anesthesia Type:MAC  Level of Consciousness: drowsy  Airway & Oxygen Therapy: Patient Spontanous Breathing  Post-op Assessment: Report given to RN  Post vital signs: stable  Last Vitals:  Vitals Value Taken Time  BP 94/60 06/05/22 1926  Temp 97.8   Pulse 61 06/05/22 1927  Resp 21 06/05/22 1927  SpO2 95 % 06/05/22 1927  Vitals shown include unvalidated device data.  Last Pain:  Vitals:   06/05/22 1658  TempSrc:   PainSc: 0-No pain         Complications: No notable events documented.

## 2022-06-05 NOTE — Op Note (Signed)
PODIATRY / FOOT AND ANKLE SURGERY OPERATIVE REPORT    SURGEON: Caroline More, DPM  PRE-OPERATIVE DIAGNOSIS:  1.  Left foot abscess first interspace/second metatarsal phalangeal joint area 2.  Possible septic joint, left second metatarsal phalangeal joint with possible osteomyelitis 3.  Cellulitis left foot 4.  Diabetes type 2 polyneuropathy 5.  PVD  POST-OPERATIVE DIAGNOSIS: Same  PROCEDURE(S): Left foot incision and drainage Left second metatarsal phalangeal joint bone biopsy and bone culture Application of vancomycin powder  HEMOSTASIS: Left ankle tourniquet  ANESTHESIA: MAC  ESTIMATED BLOOD LOSS: 30 cc  FINDING(S): 1.  Small abscess present between the distal second and third metatarsal phalangeal joint areas. 2.  Possible septic joint left second metatarsal phalangeal joint, seropurulent discharge present within the joint, metatarsal head though appeared to be hard and healthy with no articular cartilage damage  PATHOLOGY/SPECIMEN(S): Left foot joint fluid culture second metatarsal phalangeal joint, bone culture left second metatarsal phalangeal joint and bone biopsy for path  INDICATIONS:   Ricardo Brown is a 61 y.o. male who presents with recurrent cellulitis to the left forefoot.  Patient was seen in clinic around 2 weeks ago and was noted to have a new small pinpoint ulceration between the second and first metatarsal phalangeal joints.  He was also noted to have cellulitis to the left forefoot extending to the midfoot along flexor tendons.  Patient was subsequently sent to the emergency room due to concerns for cellulitis as well as for circulation issues.  Patient was seen in the hospital and was discharged on IV antibiotics after MRI and x-ray imaging was negative.  Patient came into clinic the same week for further evaluation and was noted to have worsening cellulitis as well as a wound that was starting to appear to the first interspace as well as fluctuance and  discharge to this area with continued cellulitis and necrotic changes to the wound concerning for potentially early stage gangrene.  Patient was subsequently once again sent to the emergency room for further work-up and evaluation as well as work-up for PVD.  Patient's creatinine level has been too high now for about two thirds of a week so no intervention has been performed for patient's known peripheral vascular disease based on ABIs.  MRI that was repeated showed an abscess present in the first interspace as well as possibly septic joint to the second metatarsal phalangeal joint.  All treatment options were discussed with the patient both conservative and surgical attempts at correction include potential risks and complications and at this time patient is elected for surgical procedure consisting of left foot incision and drainage with bone biopsy second metatarsal phalangeal joint.  All questions answered including postoperative course, no guarantees given.  Consent obtained prior to procedure.  Discussed with patient if he does need an amputation would wait until vascular surgery does revasc procedure/CT angiogram to determine flow and chances of healing.  Patient understands and would like to proceed.  DESCRIPTION: After obtaining full informed written consent, the patient was brought back to the operating room and placed supine upon the operating table.  The patient received IV antibiotics prior to induction.  After obtaining adequate anesthesia, 20 cc of half percent Marcaine plain was injected about the left second ray and first ray blocking these areas.  The patient was prepped and draped in the standard fashion.  An incision was made at the dorsal foot and plantar foot excising the ulceration present to the first interspace as well as to the plantar  first metatarsal phalangeal joint area.  Once this was performed there appeared to be a fair amount of bleeding.  The tissues were then subsequently  debrided to healthy bleeding tissue.  There still appeared to be somewhat mild bleeding present to the first metatarsal phalangeal joint area.  No bone was exposed to this area, appeared to be down close to capsule.  There appeared to be some liquefactive necrosis of the fat to this area.  The fatty tissue was resected and passed off the operative site.  A fair amount of bleeding continued so at this time the Esmarch bandage used to exsanguinate the left lower extremity and the pneumatic ankle tourniquet was inflated.  Any bleeding vessels were then cauterized with electrocauterization.  The surgical site was debrided yet further removing any nonviable necrotic tissues.  There did not appear to be any purulence but once again appeared to have liquefactive necrosis between the first and second metatarsals within the first interspace.  The areas were probed at the over the first and second metatarsal phalangeal joints and under the areas to and no purulence was expressed.  The flexor tendon sheath was identified and scissors were then placed along the flexor surface of the foot and no further purulence was noted to this area as well.  The area was flushed with 1.5 L of normal sterile saline with a pulse lavage.  Attention was then directed to the second metatarsal phalangeal joint area where a capsular incision was made in the second metatarsal phalangeal joint.  The joint appeared to have a sticky type yellow substance coming from the area that appeared to be somewhat creamy in nature consistent with likely seropurulent discharge concerning for septic joint.  The articular cartilage did not appear to be damaged and the bone appeared to be fairly hard with no substantial evidence of osteomyelitis, there appeared to also be healthy bleeding from the bone once a bone biopsy and bone culture was taken from the area.  This was performed with a rongeur and passed off the operative site.  Some of the joint fluid was also  collected and sent off as a wound culture.  1.5 L of normal sterile saline with pulse lavage was then used to flush out the area further.  The tissues were then inspected 1 more time and no further purulent discharge or necrosis was present.  Vancomycin powder was then placed into the wound site along the entirety of the incision and wound as well as the second metatarsal phalangeal joint.  The deep fascia and subcutaneous tissue was reapproximated well coapted with 3-0 Vicryl and the skin was then reapproximated well coapted with 3-0 nylon and a combination of simple and horizontal mattress type stitching.  The pneumatic ankle tourniquet was deflated and a prompt hyperemic response was noted to all digits of the left foot.  There appeared to be a fair amount of bleeding also coming from the incisional area so a compression dressing was applied consisting of Betadine soaked gauze between the first and second toes followed by 4 x 4 gauze, ABD, Kerlix, Ace wrap with mild compression.  The patient tolerated the procedure and anesthesia well and was transferred to the recovery room vital signs stable vascular status intact to all toes left foot.  Following a period of postoperative monitoring the patient be discharged back to the inpatient room with the appropriate orders and instructions as well as medications.  We will reevaluate tomorrow and remove dressing to inspect.  Plan hopefully  for CT angiogram with intervention on Monday with vascular surgery if creatinine level appears appropriate.  Discussed with family after surgery that patient may require subsequent surgery regarding the second metatarsal phalangeal joint but will await bone culture and biopsy results.  Also could treat with prolonged IV antibiotics.  COMPLICATIONS: None  CONDITION: Good, stable  Caroline More, DPM

## 2022-06-05 NOTE — H&P (Signed)
HISTORY AND PHYSICAL INTERVAL NOTE:  06/05/2022  6:16 PM  Ricardo Brown  has presented today for surgery, with the diagnosis of Left Foot Infection/abscess.  The various methods of treatment have been discussed with the patient.  No guarantees were given.  After consideration of risks, benefits and other options for treatment, the patient has consented to surgery.  I have reviewed the patients' chart and labs.     A history and physical examination was performed in the hospital.  The patient was reexamined.  There have been no changes to this history and physical examination.  Caroline More, DPM

## 2022-06-05 NOTE — Progress Notes (Signed)
[facility]                                                                              Ricardo Brown, is a 61 y.o. male, DOB - 05-08-1961, JQB:[id number] Admit date - 06/02/2022    Outpatient Primary MD for the patient is [name], MD  LOS - 3  days  Chief Complaint  Patient presents with   Foot Pain       Brief summary   Patient is a 61 year old male with diabetes mellitus, CKD stage IIIb, CAD status post CABG, history of chronic combined systolic and diastolic dysfunction, HTN was recently discharged on 05/30/2022, for cellulitis and diabetic foot infection involving the left foot. Patient followed up with his podiatrist outpatient and was noted to have findings concerning for early gangrene involving his left foot and were advised to return to ED CBC showed WBC count of 16.3 Imaging showed no radiographic evidence of osteomyelitis, patient was placed on IV antibiotics and admitted for further work-up.   Assessment & Plan    Principal Problem: Left diabetic foot infection ([place]), gangrene left forefoot, possible osteomyelitis left first MTP joint in the setting of severe PVD -Foot x-ray, left showed no acute osteomyelitis -ABIs that showed resting ABI within normal limits on the right, on the left midrange arterial occlusive disease.  Holding off on angiogram due to AKI, creatinine improving -Continue IV vancomycin, Rocephin - d/w Dr Luana Shu today, repeat MRI showed notable fluid collection in the first interspace, subtle changes in the second metatarsal potentially consistent with osteomyelitis, plan for surgical intervention today -N.p.o.  Severe PAD -Vascular surgery following, angiogram on hold due to AKI -Continue to hold losartan, torsemide, Zaroxolyn -Creatinine 2.4 on admission, improving, 1.69   Acute kidney injury superimposed on CKD stage IIIb -Baseline creatinine between 1.9-2.0, creatinine was 1.5 on 10/28 -Creatinine 2.4 on  admission -Losartan, torsemide, Zaroxolyn placed on hold. -Creatinine improving 2.1 today    Chronic combined systolic (congestive) and diastolic (congestive) heart failure ([place]) -2D echo 5/23 had shown EF of 45 to 50% -Currently stable, euvolemic, follow volume status closely -Continue to hold losartan, torsemide, Zaroxolyn     Type 2 diabetes mellitus, uncontrolled with diabetic chronic kidney disease ([place]) -Continue SSI, Levemir 24 units daily  Hemoglobin A1c 6.9 on 05/27/2022 Recent Labs    06/04/22 0730 06/04/22 1108 06/04/22 1630 06/04/22 2226 06/05/22 0814 06/05/22 1203  GLUCAP 178* 211* 144* 208* 141* 112*   -Currently n.p.o., no changes in insulin regimen   Hypertension, CAD status post CABG -BP currently soft, hold torsemide, metolazone, losartan -Continue metoprolol  Hypokalemia K 3.3 on 11/1, replaced -Resolved, calcium 4.0    Obesity (BMI 30-39.9) Estimated body mass index is 32.01 kg/m as calculated from the following:   Height as of this encounter: '5\' 8"'$  (1.727 m).   Weight as of this encounter: 95.5 kg.  Code Status: Full code DVT Prophylaxis:     Level of Care: Level of care: Med-Surg Family Communication: Updated patient  Disposition Plan:      Remains inpatient appropriate: Plan for or today   Procedures:  None  Consultants:   Vascular  surgery  Podiatry   Antimicrobials:   Anti-infectives (From admission, onward)    Start     Dose/Rate Route Frequency Ordered Stop   06/05/22 1100  vancomycin (VANCOREADY) IVPB 1500 mg/300 mL        1,500 mg 150 mL/hr over 120 Minutes Intravenous Every 24 hours 06/05/22 0950     06/05/22 1000  vancomycin (VANCOREADY) IVPB 1250 mg/250 mL  Status:  Discontinued        1,250 mg 166.7 mL/hr over 90 Minutes Intravenous Every 24 hours 06/04/22 1532 06/05/22 0950   06/04/22 1300  vancomycin (VANCOREADY) IVPB 1500 mg/300 mL  Status:  Discontinued        1,500 mg 150 mL/hr over 120 Minutes Intravenous Every  48 hours 06/02/22 1507 06/03/22 1129   06/04/22 0800  vancomycin (VANCOREADY) IVPB 1750 mg/350 mL  Status:  Discontinued        1,750 mg 175 mL/hr over 120 Minutes Intravenous Every 48 hours 06/03/22 1129 06/04/22 1532   06/02/22 1400  metroNIDAZOLE (FLAGYL) tablet 500 mg        500 mg Oral Every 8 hours 06/02/22 1150 06/09/22 1359   06/02/22 1230  cefTRIAXone (ROCEPHIN) 2 g in sodium chloride 0.9 % 100 mL IVPB        2 g 200 mL/hr over 30 Minutes Intravenous Every 24 hours 06/02/22 1150 06/09/22 1229   06/02/22 1215  vancomycin (VANCOREADY) IVPB 2000 mg/400 mL  Status:  Discontinued        2,000 mg 200 mL/hr over 120 Minutes Intravenous  Once 06/02/22 1208 06/02/22 1209   06/02/22 1215  vancomycin (VANCOCIN) IVPB 1000 mg/200 mL premix        1,000 mg 200 mL/hr over 60 Minutes Intravenous  Once 06/02/22 1209 06/02/22 1538   06/02/22 0945  vancomycin (VANCOCIN) IVPB 1000 mg/200 mL premix        1,000 mg 200 mL/hr over 60 Minutes Intravenous  Once 06/02/22 0944 06/02/22 1134          Medications  amiodarone  200 mg Oral Daily   vitamin C  500 mg Oral BID   aspirin EC  81 mg Oral Daily   B-complex with vitamin C  1 tablet Oral Daily   enoxaparin (LOVENOX) injection  0.5 mg/kg Subcutaneous Q24H   gabapentin  100 mg Oral BID   insulin aspart  0-15 Units Subcutaneous TID WC   insulin detemir  24 Units Subcutaneous QHS   metoprolol succinate  50 mg Oral Daily   metroNIDAZOLE  500 mg Oral Q8H   multivitamin with minerals  1 tablet Oral Daily   nutrition supplement (JUVEN)  1 packet Oral BID BM   omega-3 acid ethyl esters  1,000 mg Oral BID   pramipexole  1.5 mg Oral BID   Ensure Max Protein  11 oz Oral QHS   rosuvastatin  20 mg Oral Daily   sodium chloride flush  3 mL Intravenous Q12H   zinc sulfate  220 mg Oral Daily      Subjective:   Ricardo Brown was seen and examined today.  No acute complaints except awaiting surgery today no fevers or chills, creatinine improving.   No chest pain, shortness of breath, lower extremity edema.    Objective:   Vitals:   06/04/22 0729 06/04/22 1536 06/04/22 2356 06/05/22 0844  BP: 108/62 (!) 104/58 (!) 103/56 115/63  Pulse: 68 61 66 69  Resp: '18 16 17 17  '$ Temp: 98.5 F (36.9 C) 98.3 F (  36.8 C)  97.7 F (36.5 C)  TempSrc:      SpO2: 99% 100% 99% 98%  Weight:      Height:        Intake/Output Summary (Last 24 hours) at 06/05/2022 1258 Last data filed at 06/05/2022 1047 Gross per 24 hour  Intake --  Output 800 ml  Net -800 ml     Wt Readings from Last 3 Encounters:  06/02/22 95.5 kg  05/30/22 95.5 kg  04/30/22 94.9 kg    Physical Exam General: Alert and oriented x 3, NAD Cardiovascular: S1 S2 clear, RRR.  Respiratory: CTAB Gastrointestinal: Soft, nontender, nondistended, NBS Ext: left foot dressing intact, no pedal edema RLE Neuro: no new deficits Psych: Normal affect     Data Reviewed:  I have personally reviewed following labs    CBC Lab Results  Component Value Date   WBC 12.5 (H) 06/05/2022   RBC 3.55 (L) 06/05/2022   HGB 10.5 (L) 06/05/2022   HCT 31.0 (L) 06/05/2022   MCV 87.3 06/05/2022   MCH 29.6 06/05/2022   PLT 343 06/05/2022   MCHC 33.9 06/05/2022   RDW 13.0 06/05/2022   LYMPHSABS 1.1 05/27/2022   MONOABS 1.0 05/27/2022   EOSABS 0.0 05/27/2022   BASOSABS 0.0 49/70/2637     Last metabolic panel Lab Results  Component Value Date   NA 134 (L) 06/05/2022   K 4.0 06/05/2022   CL 99 06/05/2022   CO2 27 06/05/2022   BUN 56 (H) 06/05/2022   CREATININE 1.69 (H) 06/05/2022   GLUCOSE 175 (H) 06/05/2022   GFRNONAA 46 (L) 06/05/2022   GFRAA 69 09/30/2020   CALCIUM 8.9 06/05/2022   PHOS 3.9 05/27/2022   PROT 7.7 06/02/2022   ALBUMIN 3.2 (L) 06/02/2022   BILITOT 0.6 06/02/2022   ALKPHOS 63 06/02/2022   AST 78 (H) 06/02/2022   ALT 73 (H) 06/02/2022   ANIONGAP 8 06/05/2022    CBG (last 3)  Recent Labs    06/04/22 2226 06/05/22 0814 06/05/22 1203  GLUCAP 208* 141*  112*      Coagulation Profile: No results for input(s): "INR", "PROTIME" in the last 168 hours.    Radiology Studies: I have personally reviewed the imaging studies  No results found.     Estill Cotta M.D. [facility] 06/05/2022, 12:58 PM  Available via Epic secure chat 7am-7pm After 7 pm, please refer to night coverage provider listed on amion.

## 2022-06-05 NOTE — Care Management Important Message (Signed)
Important Message  Patient Details  Name: Ricardo Brown MRN: [id number] Date of Birth: [birth year]   Medicare Important Message Given:  Yes     Juliann Pulse A Shyia Fillingim 06/05/2022, 1:43 PM

## 2022-06-05 NOTE — TOC Progression Note (Signed)
Transition of Care [facility]) - Progression Note    Patient Details  Name: Ricardo Brown MRN: [id number] Date of Birth: 1960-08-18  Transition of Care [facility]) CM/SW [place], RN Phone Number: 06/05/2022, 12:05 PM  Clinical Narrative:    Patient from home with his wife where he is independent,  Patient states that the vascular doctor saw him today and stated that his kidneys still were not in good enough shape to perform angiogram.  Stated that it would likely be on Monday.  TOC to follow for needs and assist   Expected Discharge Plan: Home/Self Care Barriers to Discharge: Continued Medical Work up  Expected Discharge Plan and Services Expected Discharge Plan: Home/Self Care   Discharge Planning Services: CM Consult Post Acute Care Choice: NA Living arrangements for the past 2 months: Single Family Home                                       Social Determinants of Health (SDOH) Interventions    Readmission Risk Interventions    05/29/2022   11:31 AM  Readmission Risk Prevention Plan  Transportation Screening Complete  PCP or Specialist Appt within 5-7 Days Complete  Medication Review (RN CM) Complete

## 2022-06-05 NOTE — Progress Notes (Signed)
PODIATRY / FOOT AND ANKLE SURGERY PROGRESS NOTE  Requesting Physician: Dr. Francine Graven  Reason for consult: Left foot infection/wound  Chief Complaint: Left foot wound/infection   HPI: Ricardo Brown is a 61 y.o. male who presents today resting in bed comfortably.  Patient has had his dressing changed since yesterday and appears to be appropriate.  Patient has kept this dressing clean, dry, and intact since it was last changed has been using surgical shoe for ambulation.  Patient currently denies nausea, vomiting, fevers, chills.  Patient is awaiting MRI results that were taken yesterday.  PMHx:  Past Medical History:  Diagnosis Date   Chronic combined systolic (congestive) and diastolic (congestive) heart failure ([place])    a. 01/2018 EF 35-40%; b. 10/2020 Echo: EF 40-45%, glob HK. RVSP 42.30mHg. Mildly dil LA. Mild MS/AoV sclerosis.   CKD (chronic kidney disease), stage Brown (HCC)    Coronary artery disease    a. 01/2018 late presenting inferior MI; b. 01/2018 Cath: Severe multivessel dzs-->CABG x 3 (LIMA->LAD, VG->OM2, VG->RPDA @ [facility]).   Diabetes mellitus without complication ([facility]    a. Dx ~ 2000.   Heart palpitations    a. Pt reports prior nl echo's and stress tests. Last stress test w/in past 2 yrs - PCP.   High cholesterol    Hypertension    Mitral regurgitation    a. 01/2018 s/p MV repair @ time of CABG.   Myocardial infarction ([facility]    Postoperative atrial fibrillation    a. 01/2018 @ time of CABG.   PSVT (paroxysmal supraventricular tachycardia)    a. Very symptomatic with multiple ED evaluations.  Has been on amio.   Severe sepsis with acute organ dysfunction ([facility] 12/19/2021   Sleep difficulties 09/07/2019   STEMI (ST elevation myocardial infarction) ([facility] 01/24/2018    Surgical Hx:  Past Surgical History:  Procedure Laterality Date   AMPUTATION TOE Right 08/29/2021   Procedure: AMPUTATION TOE-2nd toe;  Surgeon: CSharlotte Alamo DPM;  Location: [facility] ORS;  Service: Podiatry;   Laterality: Right;   AMPUTATION TOE Left 12/22/2021   Procedure: AMPUTATION TOE-2nd Toe Partial;  Surgeon: BCaroline More DPM;  Location: [facility] ORS;  Service: Podiatry;  Laterality: Left;   CARDIAC CATHETERIZATION     CARDIAC VALVE REPLACEMENT     Mitral Valve Repair   COLONOSCOPY WITH PROPOFOL N/A 06/07/2019   Procedure: COLONOSCOPY WITH PROPOFOL;  Surgeon: TVirgel Manifold MD;  Location: [facility] ENDOSCOPY;  Service: Endoscopy;  Laterality: N/A;   COLONOSCOPY WITH PROPOFOL N/A 02/07/2020   Procedure: COLONOSCOPY WITH PROPOFOL;  Surgeon: TVirgel Manifold MD;  Location: [facility] ENDOSCOPY;  Service: Endoscopy;  Laterality: N/A;   COLONOSCOPY WITH PROPOFOL N/A 12/11/2020   Procedure: COLONOSCOPY WITH PROPOFOL;  Surgeon: TVirgel Manifold MD;  Location: [facility] ENDOSCOPY;  Service: Endoscopy;  Laterality: N/A;   CORONARY ANGIOPLASTY     CORONARY ARTERY BYPASS GRAFT     x 3   01/2018   ESOPHAGOGASTRODUODENOSCOPY N/A 02/07/2020   Procedure: ESOPHAGOGASTRODUODENOSCOPY (EGD);  Surgeon: TVirgel Manifold MD;  [facility];  Service: Endoscopy;  Laterality: N/A;   LEFT HEART CATH AND CORONARY ANGIOGRAPHY N/A 01/24/2018   Procedure: LEFT HEART CATH AND CORONARY ANGIOGRAPHY;  Surgeon: AWellington Hampshire MD;  [facility];  Service: Cardiovascular;  Laterality: N/A;   SVT ABLATION N/A 09/12/2021   Procedure: SVT ABLATION;  Surgeon: LVickie Epley MD;  [facility];  Service: Cardiovascular;  Laterality: N/A;   TONSILLECTOMY  FHx:  Family History  Problem Relation Age of Onset   Cancer Mother        died @ 52   CAD Father        First MI @ 4. S/p heart transplant. Died in mid-50's of cancer.   Cancer Father    Other Brother        alive and well    Social History:  reports that he has never smoked. He has never used smokeless tobacco. He reports that he does not currently use alcohol. He reports that he does not use drugs.  Allergies:   Allergies  Allergen Reactions   Lipitor [Atorvastatin] Palpitations    SVT   Sacubitril-Valsartan Other (See Comments)    SVT Other reaction(s): Other (see comments) Other reaction(s): Other (See Comments) SVT SVT   Pregabalin Palpitations    SVT Other reaction(s): Other (see comments) SVT SVT   Medications Prior to Admission  Medication Sig Dispense Refill   amiodarone (PACERONE) 200 MG tablet Take 200 mg by mouth daily.     aspirin 81 MG EC tablet Take 81 mg by mouth daily.      b complex vitamins tablet Take 1 tablet by mouth daily.      [EXPIRED] doxycycline (ADOXA) 100 MG tablet Take 1 tablet (100 mg total) by mouth 2 (two) times daily for 4 days. 8 tablet 0   empagliflozin (JARDIANCE) 25 MG TABS tablet Take 25 mg by mouth daily.     gabapentin (NEURONTIN) 100 MG capsule Take 1 capsule (100 mg total) by mouth 2 (two) times daily. 60 capsule 6   insulin degludec (TRESIBA) 200 UNIT/ML FlexTouch Pen 32 Units at bedtime.     losartan (COZAAR) 25 MG tablet Take 25 mg by mouth daily.     metFORMIN (GLUCOPHAGE) 500 MG tablet Take 2 tablets (1,000 mg total) by mouth 2 (two) times daily. 360 tablet 2   metolazone (ZAROXOLYN) 2.5 MG tablet Take 1 tablet (2.5 mg total) by mouth daily. 30 tablet 4   metoprolol succinate (TOPROL-XL) 50 MG 24 hr tablet Take 1 tablet (50 mg total) by mouth daily. Take with or immediately following a meal. 90 tablet 3   Omega-3 Fatty Acids (FISH OIL) 875 MG CAPS Take 875 mg by mouth 2 (two) times daily.     pramipexole (MIRAPEX) 1.5 MG tablet Take 1 tablet (1.5 mg total) by mouth 2 (two) times daily. 180 tablet 3   rosuvastatin (CRESTOR) 20 MG tablet Take 1 tablet (20 mg total) by mouth daily. 90 tablet 0   torsemide (DEMADEX) 20 MG tablet Take 2 tablets (40 mg total) by mouth daily. 40 mg daily 180 tablet 3   Dulaglutide 1.5 MG/0.5ML SOPN Inject 1.5 mg into the skin every Saturday.     [EXPIRED] levofloxacin (LEVAQUIN) 750 MG tablet Take 1 tablet (750 mg  total) by mouth every other day for 2 doses. 2 tablet 0     Physical Exam: General: Alert and oriented.  No apparent distress.  Vascular: DP/PT pulses nonpalpable bilateral, no hair growth noted to digits or foot bilateral. Dependent rubor that improves with elevation.  Neuro: Light touch sensation absent to bilateral lower extremities.  Derm: Darkened/necrotic formation present to the plantar aspect of the first metatarsal phalangeal joint area extending to the left first interspace, tissue in this area appears to be fairly fibrous and necrotic, no odor, appears to be fairly clean overall, no active purulence, reduction overall seen in erythema and edema present to  the left forefoot.  Patient has new palpable fluctuance present today to the first interspace on the dorsal and plantar aspect of the left foot  MSK: Partial second toe amputations bilaterally.  Results for orders placed or performed during the hospital encounter of 06/02/22 (from the past 48 hour(s))  Glucose, capillary     Status: Abnormal   Collection Time: 06/03/22  2:03 PM  Result Value Ref Range   Glucose-Capillary 161 (H) 70 - 99 mg/dL    Comment: Glucose reference range applies only to samples taken after fasting for at least 8 hours.  Glucose, capillary     Status: Abnormal   Collection Time: 06/03/22  4:26 PM  Result Value Ref Range   Glucose-Capillary 112 (H) 70 - 99 mg/dL    Comment: Glucose reference range applies only to samples taken after fasting for at least 8 hours.  Glucose, capillary     Status: Abnormal   Collection Time: 06/03/22  8:31 PM  Result Value Ref Range   Glucose-Capillary 147 (H) 70 - 99 mg/dL    Comment: Glucose reference range applies only to samples taken after fasting for at least 8 hours.   Comment 1 Notify RN   Basic metabolic panel     Status: Abnormal   Collection Time: 06/04/22  7:13 AM  Result Value Ref Range   Sodium 133 (L) 135 - 145 mmol/L   Potassium 3.7 3.5 - 5.1 mmol/L    Chloride 96 (L) 98 - 111 mmol/L   CO2 27 22 - 32 mmol/L   Glucose, Bld 183 (H) 70 - 99 mg/dL    Comment: Glucose reference range applies only to samples taken after fasting for at least 8 hours.   BUN 56 (H) 6 - 20 mg/dL   Creatinine, Ser 2.10 (H) 0.61 - 1.24 mg/dL   Calcium 8.7 (L) 8.9 - 10.3 mg/dL   GFR, Estimated 35 (L) >60 mL/min    Comment: (NOTE) Calculated using the CKD-EPI Creatinine Equation (2021)    Anion gap 10 5 - 15    Comment: Performed at [facility], [address] [place], [place] [address]  CBC     Status: Abnormal   Collection Time: 06/04/22  7:13 AM  Result Value Ref Range   WBC 12.6 (H) 4.0 - 10.5 K/uL   RBC 3.83 (L) 4.22 - 5.81 MIL/uL   Hemoglobin 11.2 (L) 13.0 - 17.0 g/dL   HCT 33.2 (L) 39.0 - 52.0 %   MCV 86.7 80.0 - 100.0 fL   MCH 29.2 26.0 - 34.0 pg   MCHC 33.7 30.0 - 36.0 g/dL   RDW 13.2 11.5 - 15.5 %   Platelets 344 150 - 400 K/uL   nRBC 0.0 0.0 - 0.2 %    Comment: Performed at [facility], [address] [place], [place] [address]  Glucose, capillary     Status: Abnormal   Collection Time: 06/04/22  7:30 AM  Result Value Ref Range   Glucose-Capillary 178 (H) 70 - 99 mg/dL    Comment: Glucose reference range applies only to samples taken after fasting for at least 8 hours.  Glucose, capillary     Status: Abnormal   Collection Time: 06/04/22 11:08 AM  Result Value Ref Range   Glucose-Capillary 211 (H) 70 - 99 mg/dL    Comment: Glucose reference range applies only to samples taken after fasting for at least 8 hours.  Glucose, capillary     Status: Abnormal   Collection Time:  06/04/22  4:30 PM  Result Value Ref Range   Glucose-Capillary 144 (H) 70 - 99 mg/dL    Comment: Glucose reference range applies only to samples taken after fasting for at least 8 hours.  Glucose, capillary     Status: Abnormal   Collection Time: 06/04/22 10:26 PM  Result Value Ref Range   Glucose-Capillary 208 (H) 70 - 99 mg/dL    Comment: Glucose  reference range applies only to samples taken after fasting for at least 8 hours.  Basic metabolic panel     Status: Abnormal   Collection Time: 06/05/22  5:32 AM  Result Value Ref Range   Sodium 134 (L) 135 - 145 mmol/L   Potassium 4.0 3.5 - 5.1 mmol/L   Chloride 99 98 - 111 mmol/L   CO2 27 22 - 32 mmol/L   Glucose, Bld 175 (H) 70 - 99 mg/dL    Comment: Glucose reference range applies only to samples taken after fasting for at least 8 hours.   BUN 56 (H) 6 - 20 mg/dL   Creatinine, Ser 1.69 (H) 0.61 - 1.24 mg/dL   Calcium 8.9 8.9 - 10.3 mg/dL   GFR, Estimated 46 (L) >60 mL/min    Comment: (NOTE) Calculated using the CKD-EPI Creatinine Equation (2021)    Anion gap 8 5 - 15    Comment: Performed at [facility], [address] [place], [place] [address]  CBC     Status: Abnormal   Collection Time: 06/05/22  5:32 AM  Result Value Ref Range   WBC 12.5 (H) 4.0 - 10.5 K/uL   RBC 3.55 (L) 4.22 - 5.81 MIL/uL   Hemoglobin 10.5 (L) 13.0 - 17.0 g/dL   HCT 31.0 (L) 39.0 - 52.0 %   MCV 87.3 80.0 - 100.0 fL   MCH 29.6 26.0 - 34.0 pg   MCHC 33.9 30.0 - 36.0 g/dL   RDW 13.0 11.5 - 15.5 %   Platelets 343 150 - 400 K/uL   nRBC 0.0 0.0 - 0.2 %    Comment: Performed at [facility], [address] [place], [place] [address]  Glucose, capillary     Status: Abnormal   Collection Time: 06/05/22  8:14 AM  Result Value Ref Range   Glucose-Capillary 141 (H) 70 - 99 mg/dL    Comment: Glucose reference range applies only to samples taken after fasting for at least 8 hours.  Glucose, capillary     Status: Abnormal   Collection Time: 06/05/22 12:03 PM  Result Value Ref Range   Glucose-Capillary 112 (H) 70 - 99 mg/dL    Comment: Glucose reference range applies only to samples taken after fasting for at least 8 hours.   US ARTERIAL ABI (SCREENING LOWER EXTREMITY)  Result Date: 06/03/2022 CLINICAL DATA:  61 year old male with a history of ulcer EXAM: NONINVASIVE PHYSIOLOGIC  VASCULAR STUDY OF BILATERAL LOWER EXTREMITIES DIRECTED DUPLEX LEFT LOWER EXTREMITY ARTERIAL STUDY TECHNIQUE: Evaluation of both lower extremities was performed at rest, including calculation of ankle-brachial indices, multiple segmental pressure evaluation, segmental Doppler and segmental pulse volume recording. Directed duplex performed of the left lower extremity COMPARISON:  None Available. FINDINGS: Right ABI:  1.05 Left ABI:  0.89 Right Lower Extremity: Segmental Doppler at the right ankle demonstrates triphasic waveforms Left Lower Extremity: Segmental Doppler at the left ankle demonstrates monophasic waveforms Directed duplex of the left lower extremity demonstrates triphasic waveform of the femoropopliteal system. Triphasic anterior tibial artery. Monophasic posterior tibial artery. IMPRESSION: Right: Resting ABI  within normal limits. Segmental exam performed at the right ankle demonstrates waveforms relatively maintained. Left: Resting ABI in the mild range arterial occlusive disease. Segmental and directed duplex of the left lower extremity demonstrates tibial segment arterial occlusive disease. Signed, Dulcy Fanny. Nadene Rubins, RPVI [facility] Electronically Signed   By: Corrie Mckusick D.O.   On: 06/03/2022 13:02   US ARTERIAL LOWER EXTREMITY DUPLEX LEFT (NON-ABI)  Result Date: 06/03/2022 CLINICAL DATA:  61 year old male with a history of ulcer EXAM: NONINVASIVE PHYSIOLOGIC VASCULAR STUDY OF BILATERAL LOWER EXTREMITIES DIRECTED DUPLEX LEFT LOWER EXTREMITY ARTERIAL STUDY TECHNIQUE: Evaluation of both lower extremities was performed at rest, including calculation of ankle-brachial indices, multiple segmental pressure evaluation, segmental Doppler and segmental pulse volume recording. Directed duplex performed of the left lower extremity COMPARISON:  None Available. FINDINGS: Right ABI:  1.05 Left ABI:  0.89 Right Lower Extremity: Segmental  Doppler at the right ankle demonstrates triphasic waveforms Left Lower Extremity: Segmental Doppler at the left ankle demonstrates monophasic waveforms Directed duplex of the left lower extremity demonstrates triphasic waveform of the femoropopliteal system. Triphasic anterior tibial artery. Monophasic posterior tibial artery. IMPRESSION: Right: Resting ABI within normal limits. Segmental exam performed at the right ankle demonstrates waveforms relatively maintained. Left: Resting ABI in the mild range arterial occlusive disease. Segmental and directed duplex of the left lower extremity demonstrates tibial segment arterial occlusive disease. Signed, Dulcy Fanny. Nadene Rubins, RPVI [facility] Electronically Signed   By: Corrie Mckusick D.O.   On: 06/03/2022 13:02    Blood pressure 115/63, pulse 69, temperature 97.7 F (36.5 C), resp. rate 17, height '5\' 8"'$  (1.727 m), weight 95.5 kg, SpO2 98 %.  Assessment Cellulitis and abscess left forefoot with possible osteomyelitis to the first, second metatarsal phalangeal joints Severe PVD with early gangrene to the left forefoot Diabetes type 2 with polyneuropathy  Plan -Patient seen and examined. -Previous x-ray imaging reviewed which did not show any evidence of osteomyelitis.  Previous MRI also reviewed and compared to today's MRI.  Patient has notable fluid collection in the first interspace.  Some subtle changes in the second metatarsal potentially consistent with osteomyelitis.  Still waiting on radiology read to confirm for sure. -Wound examined today.  Appears to be stable at this time, no worsening necrosis present.  Capillary fill time appears to be improved overall to the left big toe.  Reduction in erythema and edema present since last visit.  Still appears to have some necrotic changes to the wound at the plantar first metatarsal phalangeal joint extending to the first interspace with some  fluctuance present. -Iodoform packing gauze packed into the first webspace wound followed by 4 x 4 gauze, gauze roll, tape.  Patient have this changed daily still. -Discussed treatment options with patient and with vascular surgery.  Vascular surgery believes that it is necessary to perform I&D to remove potential abscess and to potentially take a bone biopsy of the possibly infected second metatarsal and even first metatarsal.  They believe that any definitive surgical invention no should wait if amputation is needed until after her circulation is improved.  Getting rid of some of the infection present potentially improve his kidney function enough to undergo CT angiogram with intervention. -All treatment options were discussed with the patient both conservative and surgical attempts at correction include potential risks and complications at this time patient has elected for surgical intervention consisting of left foot incision and drainage  with possible bone biopsy and antibiotic bead placement.  Could also consider wound VAC placement during procedure.  All questions answered including postoperative course.  No guarantees given.  Consent obtained prior to procedure.  Discussed with patient that he may need revision surgical procedure after circulation is improved depending on intraoperative results.  Patient understands. -Patient currently n.p.o. for surgical intervention today around 6 PM. -Patient is at high risk for limb loss due to history of diabetes, PVD, open ulcerations, and history of amputation.  Caroline More, DPM 06/05/2022, 12:40 PM

## 2022-06-05 NOTE — Anesthesia Preprocedure Evaluation (Signed)
Anesthesia Evaluation  Patient identified by MRN, date of birth, ID band Patient awake    Reviewed: Allergy & Precautions, H&P , NPO status , Patient's Chart, lab work & pertinent test results, reviewed documented beta blocker date and time   Airway Mallampati: II  TM Distance: >3 FB Neck ROM: full    Dental no notable dental hx. (+) Teeth Intact   Pulmonary neg pulmonary ROS, Patient abstained from smoking.   Pulmonary exam normal breath sounds clear to auscultation       Cardiovascular Exercise Tolerance: Poor hypertension, On Medications + CAD, + Past MI and +CHF   Rhythm:regular Rate:Normal     Neuro/Psych negative neurological ROS  negative psych ROS   GI/Hepatic negative GI ROS, Neg liver ROS,,,  Endo/Other  negative endocrine ROSdiabetes    Renal/GU CRFRenal disease  negative genitourinary   Musculoskeletal negative musculoskeletal ROS (+)    Abdominal   Peds negative pediatric ROS (+)  Hematology  (+) Blood dyscrasia, anemia   Anesthesia Other Findings   Reproductive/Obstetrics negative OB ROS                             Anesthesia Physical Anesthesia Plan  ASA: 3 and emergent  Anesthesia Plan: MAC   Post-op Pain Management:    Induction:   PONV Risk Score and Plan: 2  Airway Management Planned:   Additional Equipment:   Intra-op Plan:   Post-operative Plan:   Informed Consent: I have reviewed the patients History and Physical, chart, labs and discussed the procedure including the risks, benefits and alternatives for the proposed anesthesia with the patient or authorized representative who has indicated his/her understanding and acceptance.       Plan Discussed with: CRNA  Anesthesia Plan Comments:        Anesthesia Quick Evaluation

## 2022-06-05 NOTE — Anesthesia Procedure Notes (Signed)
Procedure Name: MAC Date/Time: 06/05/2022 6:20 PM  Performed by: Jerrye Noble, CRNAPre-anesthesia Checklist: Patient identified, Emergency Drugs available, Patient being monitored and Suction available Patient Re-evaluated:Patient Re-evaluated prior to induction Oxygen Delivery Method: Simple face mask

## 2022-06-06 DIAGNOSIS — I10 Essential (primary) hypertension: Secondary | ICD-10-CM | POA: Diagnosis not present

## 2022-06-06 DIAGNOSIS — I5042 Chronic combined systolic (congestive) and diastolic (congestive) heart failure: Secondary | ICD-10-CM | POA: Diagnosis not present

## 2022-06-06 DIAGNOSIS — E1122 Type 2 diabetes mellitus with diabetic chronic kidney disease: Secondary | ICD-10-CM | POA: Diagnosis not present

## 2022-06-06 DIAGNOSIS — E11628 Type 2 diabetes mellitus with other skin complications: Secondary | ICD-10-CM | POA: Diagnosis not present

## 2022-06-06 DIAGNOSIS — N1832 Chronic kidney disease, stage 3b: Secondary | ICD-10-CM

## 2022-06-06 LAB — BASIC METABOLIC PANEL
Anion gap: 6 (ref 5–15)
BUN: 45 mg/dL — ABNORMAL HIGH (ref 6–20)
CO2: 25 mmol/L (ref 22–32)
Calcium: 8.6 mg/dL — ABNORMAL LOW (ref 8.9–10.3)
Chloride: 104 mmol/L (ref 98–111)
Creatinine, Ser: 1.43 mg/dL — ABNORMAL HIGH (ref 0.61–1.24)
GFR, Estimated: 56 mL/min — ABNORMAL LOW (ref >60)
Glucose, Bld: 194 mg/dL — ABNORMAL HIGH (ref 70–99)
Potassium: 3.9 mmol/L (ref 3.5–5.1)
Sodium: 135 mmol/L (ref 135–145)

## 2022-06-06 LAB — CBC
HCT: 29.3 % — ABNORMAL LOW (ref 39.0–52.0)
Hemoglobin: 9.8 g/dL — ABNORMAL LOW (ref 13.0–17.0)
MCH: 29.6 pg (ref 26.0–34.0)
MCHC: 33.4 g/dL (ref 30.0–36.0)
MCV: 88.5 fL (ref 80.0–100.0)
Platelets: 329 10*3/uL (ref 150–400)
RBC: 3.31 MIL/uL — ABNORMAL LOW (ref 4.22–5.81)
RDW: 13.1 % (ref 11.5–15.5)
WBC: 13.1 10*3/uL — ABNORMAL HIGH (ref 4.0–10.5)
nRBC: 0 % (ref 0.0–0.2)

## 2022-06-06 LAB — GLUCOSE, CAPILLARY
Glucose-Capillary: 156 mg/dL — ABNORMAL HIGH (ref 70–99)
Glucose-Capillary: 196 mg/dL — ABNORMAL HIGH (ref 70–99)
Glucose-Capillary: 361 mg/dL — ABNORMAL HIGH (ref 70–99)
Glucose-Capillary: 301 mg/dL — ABNORMAL HIGH (ref 70–99)

## 2022-06-06 MED ORDER — PRAMIPEXOLE DIHYDROCHLORIDE 1 MG PO TABS
1.5000 mg | ORAL_TABLET | Freq: Every day | ORAL | Status: DC
  Administered 2022-06-06 – 2022-06-08 (×3): 1.5 mg via ORAL
  Filled 2022-06-06 (×3): qty 2

## 2022-06-06 MED ORDER — OXYCODONE-ACETAMINOPHEN 5-325 MG PO TABS
1.0000 | ORAL_TABLET | Freq: Four times a day (QID) | ORAL | Status: DC | PRN
  Administered 2022-06-06 – 2022-06-08 (×4): 2 via ORAL
  Filled 2022-06-06 (×4): qty 2

## 2022-06-06 NOTE — Plan of Care (Signed)

## 2022-06-06 NOTE — Plan of Care (Signed)

## 2022-06-06 NOTE — Progress Notes (Signed)
PODIATRY / FOOT AND ANKLE SURGERY PROGRESS NOTE  Requesting Physician: Dr. Francine Graven  Reason for consult: Left foot infection/wound  Chief Complaint: Left foot wound/infection   HPI: Ricardo Brown is a 61 y.o. male who presents s/p 1d L I&D with bone biopsytoday resting in bed comfortably.  Patient has minimal pain today and has kept his dressings clean and intact since the procedure.  He has been PWB with heel contact in surgical shoe.  Pt denies n/v/f/c.  PMHx:  Past Medical History:  Diagnosis Date   Chronic combined systolic (congestive) and diastolic (congestive) heart failure ([place])    a. 01/2018 EF 35-40%; b. 10/2020 Echo: EF 40-45%, glob HK. RVSP 42.49mHg. Mildly dil LA. Mild MS/AoV sclerosis.   CKD (chronic kidney disease), stage Brown (HCC)    Coronary artery disease    a. 01/2018 late presenting inferior MI; b. 01/2018 Cath: Severe multivessel dzs-->CABG x 3 (LIMA->LAD, VG->OM2, VG->RPDA @ [facility]).   Diabetes mellitus without complication ([place]    a. Dx ~ 2000.   Heart palpitations    a. Pt reports prior nl echo's and stress tests. Last stress test w/in past 2 yrs - PCP.   High cholesterol    Hypertension    Mitral regurgitation    a. 01/2018 s/p MV repair @ time of CABG.   Myocardial infarction ([facility]    Postoperative atrial fibrillation    a. 01/2018 @ time of CABG.   PSVT (paroxysmal supraventricular tachycardia)    a. Very symptomatic with multiple ED evaluations.  Has been on amio.   Severe sepsis with acute organ dysfunction ([place] 12/19/2021   Sleep difficulties 09/07/2019   STEMI ([place] elevation myocardial infarction) [place] 01/24[place]    Surgical Hx:  Past Surgical History:  Procedure Laterality Date   AMPUTATION TOE Right 08/29/2021   Procedure: AMPUTATION TOE-2nd toe;  Surgeon: CSharlotte Alamo DPM;  Location: [facility];  Service: Podiatry;  Laterality: Right;   AMPUTATION TOE Left 12/22/2021   Procedure: AMPUTATION TOE-2nd Toe Partial;  Surgeon: BCaroline More DPM;   Location: [facility];  Service: Podiatry;  Laterality: Left;   CARDIAC CATHETERIZATION     CARDIAC VALVE REPLACEMENT     Mitral Valve Repair   COLONOSCOPY WITH PROPOFOL N/A 06/07/2019   Procedure: COLONOSCOPY WITH PROPOFOL;  Surgeon: TVirgel Manifold MD;  Location: [facility];  Service: Endoscopy;  Laterality: N/A;   COLONOSCOPY WITH PROPOFOL N/A 02/07/2020   Procedure: COLONOSCOPY WITH PROPOFOL;  Surgeon: TVirgel Manifold MD;  Location: [facility] END[facility];  Service: Endoscopy;  Laterality: N/A;   COLONOSCOPY WITH PROPOFOL N/A 12/11/2020   Procedure: COLONOSCOPY WITH PROPOFOL;  Surgeon: TVirgel Manifold MD;  Location: [facility] ENDOSCOPY;  Service: Endoscopy;  Laterality: N/A;   CORONARY ANGIOPLASTY     CORONARY ARTERY BYPASS GRAFT     x 3   01/2018   ESOPHAGOGASTRODUODENOSCOPY N/A 02/07/2020   Procedure: ESOPHAGOGASTRODUODENOSCOPY (EGD);  Surgeon: TVirgel Manifold MD;  [facility];  Service: Endoscopy;  Laterality: N/A;   LEFT HEART CATH AND CORONARY ANGIOGRAPHY N/A 01/24/2018   Procedure: LEFT HEART CATH AND CORONARY ANGIOGRAPHY;  Surgeon: AWellington Hampshire MD;  [facility];  Service: Cardiovascular;  Laterality: N/A;   SVT ABLATION N/A 09/12/2021   Procedure: SVT ABLATION;  Surgeon: LVickie Epley MD;  [facility];  Service: Cardiovascular;  Laterality: N/A;   TONSILLECTOMY      FHx:  Family History  Problem Relation Age of Onset   Cancer  Mother        died @ 28   CAD Father        First MI @ 50. S/p heart transplant. Died in mid-50's of cancer.   Cancer Father    Other Brother        alive and well    Social History:  reports that he has never smoked. He has never used smokeless tobacco. He reports that he does not currently use alcohol. He reports that he does not use drugs.  Allergies:  Allergies  Allergen Reactions   Lipitor [Atorvastatin] Palpitations    SVT   Sacubitril-Valsartan Other (See Comments)     SVT Other reaction(s): Other (see comments) Other reaction(s): Other (See Comments) SVT SVT   Pregabalin Palpitations    SVT Other reaction(s): Other (see comments) SVT SVT   Medications Prior to Admission  Medication Sig Dispense Refill   amiodarone (PACERONE) 200 MG tablet Take 200 mg by mouth daily.     aspirin 81 MG EC tablet Take 81 mg by mouth daily.      b complex vitamins tablet Take 1 tablet by mouth daily.      [EXPIRED] doxycycline (ADOXA) 100 MG tablet Take 1 tablet (100 mg total) by mouth 2 (two) times daily for 4 days. 8 tablet 0   empagliflozin (JARDIANCE) 25 MG TABS tablet Take 25 mg by mouth daily.     gabapentin (NEURONTIN) 100 MG capsule Take 1 capsule (100 mg total) by mouth 2 (two) times daily. 60 capsule 6   insulin degludec (TRESIBA) 200 UNIT/ML FlexTouch Pen 32 Units at bedtime.     losartan (COZAAR) 25 MG tablet Take 25 mg by mouth daily.     metFORMIN (GLUCOPHAGE) 500 MG tablet Take 2 tablets (1,000 mg total) by mouth 2 (two) times daily. 360 tablet 2   metolazone (ZAROXOLYN) 2.5 MG tablet Take 1 tablet (2.5 mg total) by mouth daily. 30 tablet 4   metoprolol succinate (TOPROL-XL) 50 MG 24 hr tablet Take 1 tablet (50 mg total) by mouth daily. Take with or immediately following a meal. 90 tablet 3   Omega-3 Fatty Acids (FISH OIL) 875 MG CAPS Take 875 mg by mouth 2 (two) times daily.     pramipexole (MIRAPEX) 1.5 MG tablet Take 1 tablet (1.5 mg total) by mouth 2 (two) times daily. 180 tablet 3   rosuvastatin (CRESTOR) 20 MG tablet Take 1 tablet (20 mg total) by mouth daily. 90 tablet 0   torsemide (DEMADEX) 20 MG tablet Take 2 tablets (40 mg total) by mouth daily. 40 mg daily 180 tablet 3   Dulaglutide 1.5 MG/0.5ML SOPN Inject 1.5 mg into the skin every Saturday.     [EXPIRED] levofloxacin (LEVAQUIN) 750 MG tablet Take 1 tablet (750 mg total) by mouth every other day for 2 doses. 2 tablet 0     Physical Exam: General: Alert and oriented.  No apparent  distress.  Vascular: DP/PT pulses nonpalpable bilateral, no hair growth noted to digits or foot bilateral. Dependent rubor that improves with elevation.  Neuro: Light touch sensation absent to bilateral lower extremities.  Derm: Left foot incision between 1st and 2nd toes appears well coapted with sutures intact, mild maceration present, reduced erythema and edema overall.  A lot of dried blood around the procedural area.  No active drainage.       MSK: Partial second toe amputations bilaterally.  Results for orders placed or performed during the hospital encounter of 06/02/22 (from the past  48 hour(s))  Glucose, capillary     Status: Abnormal   Collection Time: 06/04/22  4:30 PM  Result Value Ref Range   Glucose-Capillary 144 (H) 70 - 99 mg/dL    Comment: Glucose reference range applies only to samples taken after fasting for at least 8 hours.  Glucose, capillary     Status: Abnormal   Collection Time: 06/04/22 10:26 PM  Result Value Ref Range   Glucose-Capillary 208 (H) 70 - 99 mg/dL    Comment: Glucose reference range applies only to samples taken after fasting for at least 8 hours.  Basic metabolic panel     Status: Abnormal   Collection Time: 06/05/22  5:32 AM  Result Value Ref Range   Sodium 134 (L) 135 - 145 mmol/L   Potassium 4.0 3.5 - 5.1 mmol/L   Chloride 99 98 - 111 mmol/L   CO2 27 22 - 32 mmol/L   Glucose, Bld 175 (H) 70 - 99 mg/dL    Comment: Glucose reference range applies only to samples taken after fasting for at least 8 hours.   BUN 56 (H) 6 - 20 mg/dL   Creatinine, Ser 1.69 (H) 0.61 - 1.24 mg/dL   Calcium 8.9 8.9 - 10.3 mg/dL   GFR, Estimated 46 (L) >60 mL/min    Comment: (NOTE) Calculated using the CKD-EPI Creatinine Equation (2021)    Anion gap 8 5 - 15    Comment: Performed at [facility], [address] [place], [place] [address]  CBC     Status: Abnormal   Collection Time: 06/05/22  5:32 AM  Result Value Ref Range   WBC 12.5 (H) 4.0 -  10.5 K/uL   RBC 3.55 (L) 4.22 - 5.81 MIL/uL   Hemoglobin 10.5 (L) 13.0 - 17.0 g/dL   HCT 31.0 (L) 39.0 - 52.0 %   MCV 87.3 80.0 - 100.0 fL   MCH 29.6 26.0 - 34.0 pg   MCHC 33.9 30.0 - 36.0 g/dL   RDW 13.0 11.5 - 15.5 %   Platelets 343 150 - 400 K/uL   nRBC 0.0 0.0 - 0.2 %    Comment: Performed at [facility] [address] [place], [place] [address]  Glucose, capillary     Status: Abnormal   Collection Time: 06/05/22  8:14 AM  Result Value Ref Range   Glucose-Capillary 141 (H) 70 - 99 mg/dL    Comment: Glucose reference range applies only to samples taken after fasting for at least 8 hours.  Glucose, capillary     Status: Abnormal   Collection Time: 06/05/22 12:03 PM  Result Value Ref Range   Glucose-Capillary 112 (H) 70 - 99 mg/dL    Comment: Glucose reference range applies only to samples taken after fasting for at least 8 hours.  Surgical PCR screen     Status: None   Collection Time: 06/05/22  3:27 PM   Specimen: Nasal Mucosa; Nasal Swab  Result Value Ref Range   MRSA, PCR NEGATIVE NEGATIVE   Staphylococcus aureus NEGATIVE NEGATIVE    Comment: (NOTE) The Xpert SA Assay (FDA approved for NASAL specimens in patients 22 years of age and older), is one component of a comprehensive surveillance program. It is not intended to diagnose infection nor to guide or monitor treatment. Performed at [facility], [address] [place], [place] [address]   Glucose, capillary     Status: Abnormal   Collection Time: 06/05/22  4:52 PM  Result Value Ref Range  Glucose-Capillary 129 (H) 70 - 99 mg/dL    Comment: Glucose reference range applies only to samples taken after fasting for at least 8 hours.  Aerobic/Anaerobic Culture w Gram Stain (surgical/deep wound)     Status: None (Preliminary result)   Collection Time: 06/05/22  6:51 PM   Specimen: PATH Other; Tissue  Result Value Ref Range   Specimen Description      FOOT Performed at [facility], [address], [place], [place] [address]    Special Requests      LEFT FOOT Performed at [facility], [address] [place], [place] [address]    Gram Stain      RARE WBC PRESENT,BOTH PMN AND MONONUCLEAR NO ORGANISMS SEEN Performed at [facility], [place] [address], [place], [place] [address]    Culture PENDING    Report Status PENDING   Aerobic/Anaerobic Culture w Gram Stain (surgical/deep wound)     Status: None (Preliminary result)   Collection Time: 06/05/22  7:00 PM   Specimen: PATH Other; Tissue  Result Value Ref Range   Specimen Description      FOOT Performed at [facility], [address] [place], [place] [address]    Special Requests      LEFT FOOT Performed at [facility], [address] [place], [place] [address]    Gram Stain      NO WBC SEEN NO ORGANISMS SEEN Performed at [facility], [place] [address], [place], [place] [address]    Culture PENDING    Report Status PENDING   Glucose, capillary     Status: Abnormal   Collection Time: 06/05/22  7:27 PM  Result Value Ref Range   Glucose-Capillary 117 (H) 70 - 99 mg/dL    Comment: Glucose reference range applies only to samples taken after fasting for at least 8 hours.  Glucose, capillary     Status: Abnormal   Collection Time: 06/05/22  9:12 PM  Result Value Ref Range   Glucose-Capillary 193 (H) 70 - 99 mg/dL    Comment: Glucose reference range applies only to samples taken after fasting for at least 8 hours.  Basic metabolic panel     Status: Abnormal   Collection Time: 06/06/22  6:08 AM  Result Value Ref Range   Sodium 135 135 - 145 mmol/L   Potassium 3.9 3.5 - 5.1 mmol/L   Chloride 104 98 - 111 mmol/L   CO2 25 22 - 32 mmol/L   Glucose, Bld 194 (H) 70 - 99 mg/dL    Comment: Glucose reference range applies only to samples taken after fasting for at least 8 hours.   BUN 45 (H) 6 - 20 mg/dL   Creatinine, Ser 1.43 (H) 0.61 - 1.24 mg/dL   Calcium 8.6 (L) 8.9 -  10.3 mg/dL   GFR, Estimated 56 (L) >60 mL/min    Comment: (NOTE) Calculated using the CKD-EPI Creatinine Equation (2021)    Anion gap 6 5 - 15    Comment: Performed at [facility], [address] [place], [place] [address]  CBC     Status: Abnormal   Collection Time: 06/06/22  6:09 AM  Result Value Ref Range   WBC 13.1 (H) 4.0 - 10.5 K/uL   RBC 3.31 (L) 4.22 - 5.81 MIL/uL   Hemoglobin 9.8 (L) 13.0 - 17.0 g/dL   HCT 29.3 (L) 39.0 - 52.0 %   MCV 88.5 80.0 - 100.0 fL   MCH  29.6 26.0 - 34.0 pg   MCHC 33.4 30.0 - 36.0 g/dL   RDW 13.1 11.5 - 15.5 %   Platelets 329 150 - 400 K/uL   nRBC 0.0 0.0 - 0.2 %    Comment: Performed at [facility], [address] [place], [place] [address]  Glucose, capillary     Status: Abnormal   Collection Time: 06/06/22  8:22 AM  Result Value Ref Range   Glucose-Capillary 156 (H) 70 - 99 mg/dL    Comment: Glucose reference range applies only to samples taken after fasting for at least 8 hours.  Glucose, capillary     Status: Abnormal   Collection Time: 06/06/22 11:49 AM  Result Value Ref Range   Glucose-Capillary 196 (H) 70 - 99 mg/dL    Comment: Glucose reference range applies only to samples taken after fasting for at least 8 hours.   MR FOOT LEFT WO CONTRAST  Result Date: 06/05/2022 CLINICAL DATA:  Foot swelling, diabetic, osteomyelitis suspected, xray done EXAM: MRI OF THE LEFT FOOT WITHOUT CONTRAST TECHNIQUE: Multiplanar, multisequence MR imaging of the left forefoot was performed. No intravenous contrast was administered. COMPARISON:  Radiograph 06/02/2022, MRI 05/25/2022 FINDINGS: Bones/Joint/Cartilage Prior amputation of the second digit distal phalanx. There is periarticular marrow edema in the midfoot most prominent at the second and third TMT joints related to moderate osteoarthritis, unchanged from prior. There is new mild marrow edema signal within the peripheral aspects of the second metatarsal head and base of the second  digit proximal phalanx (series 5, image 20, series 5, image 16). There is a trace second MTP joint effusion. Ligaments Intact Lisfranc ligament. Muscles and Tendons Diffuse intramuscular edema and atrophy of the foot as is commonly seen in diabetics. No acute tendon tear. There is focal tenosynovitis of the second digit extensor tendon at the level of the mid metatarsal, favored to be reactive but could potentially be infectious (series 5, image 27). Soft tissues Diffuse soft tissue swelling of the foot. There is new coalescence fluid in the first webspace extending from a plantar ulcer to the dorsal aspect, measuring 3.4 x 1.2 cm (series 5, image 16), and 5.7 cm in length along the plantar aspect (series 9, image 13), and 2.7 cm in length at the dorsal aspect (series 8, image 7). The plantar aspect extends along the distal aspect of the plantar fascia head (series 9, image 14). The fluid also extends along the dorsal aspect of the second digit proximal phalanx extending laterally into the third webspace (series 5, images 17-16). IMPRESSION: Medial plantar forefoot ulcer, with underlying thin fluid collection extending from the plantar to dorsal aspect of the first webspace consistent with sinus tract and probable developing abscess. Fluid extends along the adjacent dorsal aspect of the second digit proximal phalanx laterally into the third webspace. The first web space fluid collection measures 3.4 x 1.2 cm in the axial plane, with thin extension 5.7 cm in length along the plantar aspect and 2.7 cm in length at the dorsal aspect. this is new from prior MRI. New mild marrow edema within the second metatarsal head and base of the second digit proximal phalanx with trace second MTP joint effusion, suspicious for early osteomyelitis and septic arthritis. Mild focal tenosynovitis of the second digit extensor tendon at the level of the mid metatarsal, favored to be reactive, infectious tenosynovitis is possible. Diffuse  soft tissue swelling of the foot, could be lymphedema or cellulitis. Electronically Signed   By: Ileene Patrick.D.  On: 06/05/2022 08:46    Blood pressure 125/64, pulse (!) 57, temperature 97.9 F (36.6 C), resp. rate 17, height '5\' 8"'$  (1.727 m), weight 95.5 kg, SpO2 100 %.  Assessment Cellulitis and abscess left forefoot with possible osteomyelitis to the first, second metatarsal phalangeal joints s/p I&D with bone biopsy PVD  Diabetes type 2 with polyneuropathy CKD  Plan -Patient seen and examined. -Incision site appears to be well coapted with sutures intact, no active drainage present.  Overall reduction in erythema and edema present to the left forefoot compared to preop.  Appears to be stable at this time. -Redressed today with Betadine soaked gauze between toes followed by 4 x 4 gauze, ABD, Kerlix, Ace wrap.  We will likely come back and examined again on Monday and do a dressing change then.  For now keep dressings clean, dry, and intact. -Recommend heel weightbearing in surgical shoe.  Ordered surgical shoe with heel wedge.  Also ordered PT consult to help train him on weightbearing with heel contact. -Appreciate vascular recommendations, potentially plan for surgical intervention on Monday.  Patient did have a fair amount of bleeding during procedure indicating he likely has enough circulation to heal but would still recommend CT angiogram with possible due to monophasic PT as well as reduction in ABI.  Patient also has substantial amount of arterial calcification.  Creatinine appears to still be reducing. -Wound culture growing MSSA.  Culture taken in office grew MRSA about 2 to 3 weeks ago.  Surgical wound culture taken of joint fluid as well as bone to the second metatarsal phalangeal joint so far not growing any bacteria.  Path report pending.  Appreciate medicine recommendations for antibiotic therapy. -Discussed with patient if he does not of having septic arthritis to the second  metatarsal phalangeal joint could be treated with IV antibiotics for prolonged period of time.  If he does not want to go that route then could consider second metatarsal phalangeal joint resection.  We will examine patient once again on Monday, if appears stable could potentially consider discharge after that time if CT angiogram is performed.  Caroline More, DPM 06/06/2022, 3:19 PM

## 2022-06-06 NOTE — Progress Notes (Signed)
[facility]                                                                              Ricardo Brown, is a 61 y.o. male, DOB - 05-Jul-1961, ZGY:[id number] Admit date - 06/02/2022    Outpatient Primary MD for the patient is [name], MD  LOS - 3  days  Chief Complaint  Patient presents with   Foot Pain       Brief summary   Patient is a 61 year old male with diabetes mellitus, CKD stage IIIb, CAD status post CABG, history of chronic combined systolic and diastolic dysfunction, HTN was recently discharged on 05/30/2022, for cellulitis and diabetic foot infection involving the left foot. Patient followed up with his podiatrist outpatient and was noted to have findings concerning for early gangrene involving his left foot and were advised to return to ED CBC showed WBC count of 16.3 Imaging showed no radiographic evidence of osteomyelitis, patient was placed on IV antibiotics and admitted for further work-up.   Assessment & Plan    Principal Problem: Left diabetic foot infection ([place]), gangrene left forefoot, possible osteomyelitis left first MTP joint in the setting of severe PVD -Foot x-ray, left showed no acute osteomyelitis -ABIs that showed resting ABI within normal limits on the right, on the left midrange arterial occlusive disease.  Holding off on angiogram due to AKI, creatinine improving - repeat MRI showed abscess in the 1st/2nd interspace, left 2nd MTP joint with possible osteomyelitis -Underwent left foot I&D, left second MTP joint with bone biopsy and culture on 11/3 -Patient was placed on IV vancomycin, Rocephin, transitioned to IV Unasyn    Severe PAD -Vascular surgery following, angiogram on hold due to AKI -Continue to hold losartan, torsemide, Zaroxolyn -Creatinine 2.4 on admission -Creatinine to 1.4 today   Acute kidney injury superimposed on CKD stage IIIb -Baseline creatinine between 1.9-2.0, creatinine was 1.5 on  10/28 -Creatinine 2.4 on admission -Losartan, torsemide, Zaroxolyn placed on hold. -Creatinine improving    Chronic combined systolic (congestive) and diastolic (congestive) heart failure ([place]) -2D echo 5/23 had shown EF of 45 to 50% -Stable and euvolemic, negative balance of 3.2 L, follow closely -Continue to hold losartan, torsemide, Zaroxolyn     Type 2 diabetes mellitus, uncontrolled with diabetic chronic kidney disease ([place]) -Continue SSI, Levemir 24 units daily  Hemoglobin A1c 6.9 on 05/27/2022 Recent Labs    06/05/22 0814 06/05/22 1203 06/05/22 1652 06/05/22 1927 06/05/22 2112 06/06/22 0822  GLUCAP 141* 112* 129* 117* 193* 156*      Hypertension, CAD status post CABG -BP currently soft, hold torsemide, metolazone, losartan -Continue metoprolol  Hypokalemia K 3.3 on 11/1, replaced -Resolved    Obesity (BMI 30-39.9) Estimated body mass index is 32.01 kg/m as calculated from the following:   Height as of this encounter: '5\' 8"'$  (1.727 m).   Weight as of this encounter: 95.5 kg.  Code Status: Full code DVT Prophylaxis:     Level of Care: Level of care: Med-Surg Family Communication: Updated patient  Disposition Plan:      Remains inpatient appropriate:   Procedures:  11/3 -Underwent left foot I&D, left  second MTP joint with bone biopsy and culture Consultants:   Vascular surgery  Podiatry   Antimicrobials:   Anti-infectives (From admission, onward)    Start     Dose/Rate Route Frequency Ordered Stop   06/05/22 1100  vancomycin (VANCOREADY) IVPB 1500 mg/300 mL        1,500 mg 150 mL/hr over 120 Minutes Intravenous Every 24 hours 06/05/22 0950     06/05/22 1000  vancomycin (VANCOREADY) IVPB 1250 mg/250 mL  Status:  Discontinued        1,250 mg 166.7 mL/hr over 90 Minutes Intravenous Every 24 hours 06/04/22 1532 06/05/22 0950   06/04/22 1300  vancomycin (VANCOREADY) IVPB 1500 mg/300 mL  Status:  Discontinued        1,500 mg 150 mL/hr over 120 Minutes  Intravenous Every 48 hours 06/02/22 1507 06/03/22 1129   06/04/22 0800  vancomycin (VANCOREADY) IVPB 1750 mg/350 mL  Status:  Discontinued        1,750 mg 175 mL/hr over 120 Minutes Intravenous Every 48 hours 06/03/22 1129 06/04/22 1532   06/02/22 1400  metroNIDAZOLE (FLAGYL) tablet 500 mg        500 mg Oral Every 8 hours 06/02/22 1150 06/09/22 1359   06/02/22 1230  cefTRIAXone (ROCEPHIN) 2 g in sodium chloride 0.9 % 100 mL IVPB        2 g 200 mL/hr over 30 Minutes Intravenous Every 24 hours 06/02/22 1150 06/09/22 1229   06/02/22 1215  vancomycin (VANCOREADY) IVPB 2000 mg/400 mL  Status:  Discontinued        2,000 mg 200 mL/hr over 120 Minutes Intravenous  Once 06/02/22 1208 06/02/22 1209   06/02/22 1215  vancomycin (VANCOCIN) IVPB 1000 mg/200 mL premix        1,000 mg 200 mL/hr over 60 Minutes Intravenous  Once 06/02/22 1209 06/02/22 1538   06/02/22 0945  vancomycin (VANCOCIN) IVPB 1000 mg/200 mL premix        1,000 mg 200 mL/hr over 60 Minutes Intravenous  Once 06/02/22 0944 06/02/22 1134          Medications  amiodarone  200 mg Oral Daily   vitamin C  500 mg Oral BID   aspirin EC  81 mg Oral Daily   B-complex with vitamin C  1 tablet Oral Daily   enoxaparin (LOVENOX) injection  0.5 mg/kg Subcutaneous Q24H   gabapentin  100 mg Oral BID   insulin aspart  0-15 Units Subcutaneous TID WC   insulin detemir  24 Units Subcutaneous QHS   metoprolol succinate  50 mg Oral Daily   metroNIDAZOLE  500 mg Oral Q8H   multivitamin with minerals  1 tablet Oral Daily   nutrition supplement (JUVEN)  1 packet Oral BID BM   omega-3 acid ethyl esters  1,000 mg Oral BID   pramipexole  1.5 mg Oral BID   Ensure Max Protein  11 oz Oral QHS   rosuvastatin  20 mg Oral Daily   sodium chloride flush  3 mL Intravenous Q12H   zinc sulfate  220 mg Oral Daily      Subjective:   Ricardo Brown was seen and examined today.  No acute complaints.  No fevers or chills, nausea vomiting or  constipation.  Postop day #1  Objective:   Vitals:   06/04/22 0729 06/04/22 1536 06/04/22 2356 06/05/22 0844  BP: 108/62 (!) 104/58 (!) 103/56 115/63  Pulse: 68 61 66 69  Resp: '18 16 17 17  '$ Temp: 98.5 F (36.9  C) 98.3 F (36.8 C)  97.7 F (36.5 C)  TempSrc:      SpO2: 99% 100% 99% 98%  Weight:      Height:        Intake/Output Summary (Last 24 hours) at 06/05/2022 1258 Last data filed at 06/05/2022 1047 Gross per 24 hour  Intake --  Output 800 ml  Net -800 ml     Wt Readings from Last 3 Encounters:  06/02/22 95.5 kg  05/30/22 95.5 kg  04/30/22 94.9 kg   Physical Exam General: Alert and oriented x 3, NAD Cardiovascular: S1 S2 clear, RRR.  Respiratory: CTAB, no wheezing, rales  Gastrointestinal: Soft, nontender, nondistended, NBS Ext: no pedal edema bilaterally Neuro: no new deficits Skin: Left foot dressing intact Psych: Normal affect     Data Reviewed:  I have personally reviewed following labs    CBC Lab Results  Component Value Date   WBC 12.5 (H) 06/05/2022   RBC 3.55 (L) 06/05/2022   HGB 10.5 (L) 06/05/2022   HCT 31.0 (L) 06/05/2022   MCV 87.3 06/05/2022   MCH 29.6 06/05/2022   PLT 343 06/05/2022   MCHC 33.9 06/05/2022   RDW 13.0 06/05/2022   LYMPHSABS 1.1 05/27/2022   MONOABS 1.0 05/27/2022   EOSABS 0.0 05/27/2022   BASOSABS 0.0 71/24/5809     Last metabolic panel Lab Results  Component Value Date   NA 134 (L) 06/05/2022   K 4.0 06/05/2022   CL 99 06/05/2022   CO2 27 06/05/2022   BUN 56 (H) 06/05/2022   CREATININE 1.69 (H) 06/05/2022   GLUCOSE 175 (H) 06/05/2022   GFRNONAA 46 (L) 06/05/2022   GFRAA 69 09/30/2020   CALCIUM 8.9 06/05/2022   PHOS 3.9 05/27/2022   PROT 7.7 06/02/2022   ALBUMIN 3.2 (L) 06/02/2022   BILITOT 0.6 06/02/2022   ALKPHOS 63 06/02/2022   AST 78 (H) 06/02/2022   ALT 73 (H) 06/02/2022   ANIONGAP 8 06/05/2022    CBG (last 3)  Recent Labs    06/04/22 2226 06/05/22 0814 06/05/22 1203  GLUCAP 208* 141*  112*      Coagulation Profile: No results for input(s): "INR", "PROTIME" in the last 168 hours.    Radiology Studies: I have personally reviewed the imaging studies  No results found.     Estill Cotta M.D. [facility] 06/05/2022, 12:58 PM  Available via Epic secure chat 7am-7pm After 7 pm, please refer to night coverage provider listed on amion.

## 2022-06-07 DIAGNOSIS — L03116 Cellulitis of left lower limb: Secondary | ICD-10-CM | POA: Diagnosis not present

## 2022-06-07 DIAGNOSIS — I5042 Chronic combined systolic (congestive) and diastolic (congestive) heart failure: Secondary | ICD-10-CM | POA: Diagnosis not present

## 2022-06-07 DIAGNOSIS — I739 Peripheral vascular disease, unspecified: Secondary | ICD-10-CM | POA: Diagnosis not present

## 2022-06-07 DIAGNOSIS — E11628 Type 2 diabetes mellitus with other skin complications: Secondary | ICD-10-CM | POA: Diagnosis not present

## 2022-06-07 LAB — GLUCOSE, CAPILLARY
Glucose-Capillary: 143 mg/dL — ABNORMAL HIGH (ref 70–99)
Glucose-Capillary: 237 mg/dL — ABNORMAL HIGH (ref 70–99)
Glucose-Capillary: 273 mg/dL — ABNORMAL HIGH (ref 70–99)
Glucose-Capillary: 352 mg/dL — ABNORMAL HIGH (ref 70–99)

## 2022-06-07 LAB — CULTURE, BLOOD (ROUTINE X 2)
Culture: NO GROWTH
Culture: NO GROWTH
Special Requests: ADEQUATE

## 2022-06-07 LAB — AEROBIC/ANAEROBIC CULTURE W GRAM STAIN (SURGICAL/DEEP WOUND)

## 2022-06-07 NOTE — Plan of Care (Signed)

## 2022-06-07 NOTE — Progress Notes (Signed)
[facility]                                                                              Ricardo Brown, is a 62 y.o. male, DOB - May 26, 1961, [id number] Admit date - 06/02/2022    Outpatient Primary MD for the patient is [name], MD  LOS - 5  days  Chief Complaint  Patient presents with   Foot Pain       Brief summary   Patient is a 61 year old male with diabetes mellitus, CKD stage IIIb, CAD status post CABG, history of chronic combined systolic and diastolic dysfunction, HTN was recently discharged on 05/30/2022, for cellulitis and diabetic foot infection involving the left foot. Patient followed up with his podiatrist outpatient and was noted to have findings concerning for early gangrene involving his left foot and were advised to return to ED CBC showed WBC count of 16.3 Imaging showed no radiographic evidence of osteomyelitis, patient was placed on IV antibiotics and admitted for further work-up.   Assessment & Plan    Principal Problem: Left diabetic foot infection ([place]), gangrene left forefoot, possible osteomyelitis left first MTP joint in the setting of severe PVD -Foot x-ray, left showed no acute osteomyelitis -ABIs that showed resting ABI within normal limits on the right, on the left midrange arterial occlusive disease.  Holding off on angiogram due to AKI, creatinine improving - repeat MRI showed abscess in the 1st/2nd interspace, left 2nd MTP joint with possible osteomyelitis -Underwent left foot I&D, left second MTP joint with bone biopsy and culture on 11/3 -Patient was placed on IV vancomycin, Rocephin, now transitioned to Unasyn  Severe PAD -Vascular surgery following, angiogram on hold due to AKI -Continue to hold losartan, torsemide, Zaroxolyn -Creatinine 2.4 on admission -Creatinine improving, n.p.o. after midnight, for possible angiogram   Acute kidney injury superimposed on CKD stage IIIb -Baseline creatinine between  1.9-2.0, creatinine was 1.5 on 10/28 -Creatinine 2.4 on admission -Losartan, torsemide, Zaroxolyn placed on hold. -recheck Cr in am     Chronic combined systolic (congestive) and diastolic (congestive) heart failure ([place]) -2D echo 5/23 had shown EF of 45 to 50% -Stable and euvolemic, negative balance of 4.4L -Continue to hold losartan, torsemide, Zaroxolyn     Type 2 diabetes mellitus, uncontrolled with diabetic chronic kidney disease ([place]) -Continue SSI, Levemir 24 units daily  Hemoglobin A1c 6.9 on 05/27/2022   Hypertension, CAD status post CABG -BP currently soft, hold torsemide, metolazone, losartan -Continue metoprolol  Hypokalemia K 3.3 on 11/1, replaced -Resolved    Obesity (BMI 30-39.9) Estimated body mass index is 32.01 kg/m as calculated from the following:   Height as of this encounter: '5\' 8"'$  (1.727 m).   Weight as of this encounter: 95.5 kg.  Code Status: Full code DVT Prophylaxis:     Level of Care: Level of care: Med-Surg Family Communication: Updated patient  Disposition Plan:      Remains inpatient appropriate:   Procedures:  11/3 -Underwent left foot I&D, left second MTP joint with bone biopsy and culture Consultants:   Vascular surgery  Podiatry   Antimicrobials:   Anti-infectives (From admission, onward)  Start     Dose/Rate Route Frequency Ordered Stop   06/05/22 2200  Ampicillin-Sulbactam (UNASYN) 3 g in sodium chloride 0.9 % 100 mL IVPB        3 g 200 mL/hr over 30 Minutes Intravenous Every 6 hours 06/05/22 1349     06/05/22 1850  vancomycin (VANCOCIN) powder  Status:  Discontinued          As needed 06/05/22 1851 06/05/22 1923   06/05/22 1500  Ampicillin-Sulbactam (UNASYN) 3 g in sodium chloride 0.9 % 100 mL IVPB  Status:  Discontinued        3 g 200 mL/hr over 30 Minutes Intravenous Every 6 hours 06/05/22 1347 06/05/22 1349   06/05/22 1100  vancomycin (VANCOREADY) IVPB 1500 mg/300 mL  Status:  Discontinued        1,500 mg 150 mL/hr  over 120 Minutes Intravenous Every 24 hours 06/05/22 0950 06/05/22 1347   06/05/22 1000  vancomycin (VANCOREADY) IVPB 1250 mg/250 mL  Status:  Discontinued        1,250 mg 166.7 mL/hr over 90 Minutes Intravenous Every 24 hours 06/04/22 1532 06/05/22 0950   06/04/22 1300  vancomycin (VANCOREADY) IVPB 1500 mg/300 mL  Status:  Discontinued        1,500 mg 150 mL/hr over 120 Minutes Intravenous Every 48 hours 06/02/22 1507 06/03/22 1129   06/04/22 0800  vancomycin (VANCOREADY) IVPB 1750 mg/350 mL  Status:  Discontinued        1,750 mg 175 mL/hr over 120 Minutes Intravenous Every 48 hours 06/03/22 1129 06/04/22 1532   06/02/22 1400  metroNIDAZOLE (FLAGYL) tablet 500 mg  Status:  Discontinued        500 mg Oral Every 8 hours 06/02/22 1150 06/05/22 1347   06/02/22 1230  cefTRIAXone (ROCEPHIN) 2 g in sodium chloride 0.9 % 100 mL IVPB  Status:  Discontinued        2 g 200 mL/hr over 30 Minutes Intravenous Every 24 hours 06/02/22 1150 06/05/22 1347   06/02/22 1215  vancomycin (VANCOREADY) IVPB 2000 mg/400 mL  Status:  Discontinued        2,000 mg 200 mL/hr over 120 Minutes Intravenous  Once 06/02/22 1208 06/02/22 1209   06/02/22 1215  vancomycin (VANCOCIN) IVPB 1000 mg/200 mL premix        1,000 mg 200 mL/hr over 60 Minutes Intravenous  Once 06/02/22 1209 06/02/22 1538   06/02/22 0945  vancomycin (VANCOCIN) IVPB 1000 mg/200 mL premix        1,000 mg 200 mL/hr over 60 Minutes Intravenous  Once 06/02/22 0944 06/02/22 1134          Medications  amiodarone  200 mg Oral Daily   vitamin C  500 mg Oral BID   aspirin EC  81 mg Oral Daily   B-complex with vitamin C  1 tablet Oral Daily   enoxaparin (LOVENOX) injection  0.5 mg/kg Subcutaneous Q24H   gabapentin  100 mg Oral BID   insulin aspart  0-15 Units Subcutaneous TID WC   insulin detemir  24 Units Subcutaneous QHS   metoprolol succinate  50 mg Oral Daily   multivitamin with minerals  1 tablet Oral Daily   nutrition supplement (JUVEN)  1  packet Oral BID BM   omega-3 acid ethyl esters  1,000 mg Oral BID   pramipexole  1.5 mg Oral QHS   Ensure Max Protein  11 oz Oral QHS   rosuvastatin  20 mg Oral Daily   sodium chloride flush  3 mL Intravenous Q12H   zinc sulfate  220 mg Oral Daily      Subjective:   Ricardo Brown was seen and examined today.  Doing well, sitting up in the chair, no acute complaints.   Objective:   Vitals:   06/06/22 1953 06/07/22 0020 06/07/22 0432 06/07/22 0759  BP: 131/63 (!) 117/59 128/66 119/60  Pulse: 63 61 (!) 59 (!) 56  Resp: '17 16 16 17  '$ Temp: 97.6 F (36.4 C) 97.6 F (36.4 C) (!) 97.5 F (36.4 C) 97.7 F (36.5 C)  TempSrc:      SpO2: 100% 99% 100% 100%  Weight:      Height:        Intake/Output Summary (Last 24 hours) at 06/07/2022 1424 Last data filed at 06/07/2022 0717 Gross per 24 hour  Intake 203 ml  Output 1000 ml  Net -797 ml     Wt Readings from Last 3 Encounters:  06/02/22 95.5 kg  05/30/22 95.5 kg  04/30/22 94.9 kg    Physical Exam General: Alert and oriented x 3, NAD Cardiovascular: S1 S2 clear, RRR.  Respiratory: CTAB, no wheezing, rales or rhonchi Gastrointestinal: Soft, nontender, nondistended, NBS Ext: no pedal edema bilaterally Neuro: no new deficits Skin: Left foot dressing intact   Data Reviewed:  I have personally reviewed following labs    CBC Lab Results  Component Value Date   WBC 13.1 (H) 06/06/2022   RBC 3.31 (L) 06/06/2022   HGB 9.8 (L) 06/06/2022   HCT 29.3 (L) 06/06/2022   MCV 88.5 06/06/2022   MCH 29.6 06/06/2022   PLT 329 06/06/2022   MCHC 33.4 06/06/2022   RDW 13.1 06/06/2022   LYMPHSABS 1.1 05/27/2022   MONOABS 1.0 05/27/2022   EOSABS 0.0 05/27/2022   BASOSABS 0.0 44/31/5400     Last metabolic panel Lab Results  Component Value Date   NA 135 06/06/2022   K 3.9 06/06/2022   CL 104 06/06/2022   CO2 25 06/06/2022   BUN 45 (H) 06/06/2022   CREATININE 1.43 (H) 06/06/2022   GLUCOSE 194 (H) 06/06/2022   GFRNONAA  56 (L) 06/06/2022   GFRAA 69 09/30/2020   CALCIUM 8.6 (L) 06/06/2022   PHOS 3.9 05/27/2022   PROT 7.7 06/02/2022   ALBUMIN 3.2 (L) 06/02/2022   BILITOT 0.6 06/02/2022   ALKPHOS 63 06/02/2022   AST 78 (H) 06/02/2022   ALT 73 (H) 06/02/2022   ANIONGAP 6 06/06/2022    CBG (last 3)  Recent Labs    06/06/22 2128 06/07/22 0801 06/07/22 1127  GLUCAP 301* 143* 237*      Coagulation Profile: No results for input(s): "INR", "PROTIME" in the last 168 hours.    Radiology Studies: I have personally reviewed the imaging studies  No results found.     Estill Cotta M.D. [facility] 06/07/2022, 2:24 PM  Available via Epic secure chat 7am-7pm After 7 pm, please refer to night coverage provider listed on amion.

## 2022-06-07 NOTE — Evaluation (Signed)
Physical Therapy Evaluation Patient Details Name: Ricardo Brown MRN: [id number] DOB: [birth year] Today's Date: 06/07/2022  History of Present Illness  Pt is a 61 y/o M admitted on 06/02/22. Pt was recently d/c on 05/30/22 for cellulitis & L diabetic foot infection. Pt f/u with outpatient podiatry & there was concern for early gangrene so pt was advised to return to ED. Pt underwent L foot I&D with bone biopsy. PMH: DM, CKD3B, CAD s/p CABG, chronic combined systolic & diastolic dysfunction, HTN  Clinical Impression  Pt seen for PT evaluation with pt agreeable to tx. Pt reports he's been ambulating prior to PT arrival. Pt stands with independence but experiences 1 LOB requiring CGA to correct (pt reports he has neuropathy at baseline). Pt ambulates to door & back without AD with post op shoe & mod I with pt attempting to weight bear primarily through L heel. Upon inspection, pt has regular post op shoe with entire bottom built up. Messaged podiatry (Dr. Luana Shu) who reports he'd like pt to have built up wedge shoe if possible but if not his current post op shoe is fine. Requested nurse f/u with obtaining wedge shoe. Will plan to return to see pt tomorrow in anticipation of him receiving built up wedge shoe. Educated pt this may decrease his balance with mobility but will f/u tomorrow to make sure he is at same level of mobility as he is today in current post op shoe -- pt & wife agreeable.       Recommendations for follow up therapy are one component of a multi-disciplinary discharge planning process, led by the attending physician.  Recommendations may be updated based on patient status, additional functional criteria and insurance authorization.  Follow Up Recommendations No PT follow up      Assistance Recommended at Discharge PRN  Patient can return home with the following       Equipment Recommendations None recommended by PT  Recommendations for Other Services       Functional  Status Assessment Patient has had a recent decline in their functional status and demonstrates the ability to make significant improvements in function in a reasonable and predictable amount of time.     Precautions / Restrictions Precautions Precautions: None Restrictions Weight Bearing Restrictions: Yes LLE Weight Bearing: Partial weight bearing LLE Partial Weight Bearing Percentage or Pounds: through heel in post op wedge shoe      Mobility  Bed Mobility               General bed mobility comments: not tested, pt received & left in recliner    Transfers Overall transfer level: Independent   Transfers: Sit to/from Stand Sit to Stand: Independent           General transfer comment: STS from recliner without AD    Ambulation/Gait Ambulation/Gait assistance: Min guard, Modified independent (Device/Increase time) Gait Distance (Feet): 25 Feet Assistive device: None Gait Pattern/deviations: Decreased step length - right, Decreased step length - left, Decreased stride length       General Gait Details: Pt with 1 LOB upon standing with CGA to recover. Pt ambulates to door & back without AD attempting to weight bear primarily through L heel.  Stairs            Wheelchair Mobility    Modified Rankin (Stroke Patients Only)       Balance Overall balance assessment: Needs assistance   Sitting balance-Leahy Scale: Good       Standing balance-Leahy  Scale: Fair                               Pertinent Vitals/Pain Pain Assessment Pain Assessment: No/denies pain    Home Living Family/patient expects to be discharged to:: Private residence Living Arrangements: Spouse/significant other Available Help at Discharge: Family Type of Home: House Home Access: Stairs to enter Entrance Stairs-Rails: None Entrance Stairs-Number of Steps: 1 step to enter without rails, 1 step down to den (if entering through front door)   Home Layout: One  level Home Equipment: Conservation officer, nature (2 wheels)      Prior Function Prior Level of Function : Independent/Modified Independent;Driving             Mobility Comments: Pt reports he was independent without AD, driving, doesn't work.       Hand Dominance        Extremity/Trunk Assessment   Upper Extremity Assessment Upper Extremity Assessment: Overall WFL for tasks assessed    Lower Extremity Assessment Lower Extremity Assessment: Overall WFL for tasks assessed       Communication   Communication: No difficulties  Cognition Arousal/Alertness: Awake/alert Behavior During Therapy: WFL for tasks assessed/performed Overall Cognitive Status: Within Functional Limits for tasks assessed                                          General Comments      Exercises     Assessment/Plan    PT Assessment Patient needs continued PT services  PT Problem List Decreased balance;Decreased knowledge of precautions;Decreased safety awareness       PT Treatment Interventions Gait training;DME instruction;Stair training;Patient/family education    PT Goals (Current goals can be found in the Care Plan section)  Acute Rehab PT Goals Patient Stated Goal: sx tomorrow & go home Tuesday PT Goal Formulation: With patient Time For Goal Achievement: 06/21/22 Potential to Achieve Goals: Good    Frequency 7X/week     Co-evaluation               AM-PAC PT "6 Clicks" Mobility  Outcome Measure Help needed turning from your back to your side while in a flat bed without using bedrails?: None Help needed moving from lying on your back to sitting on the side of a flat bed without using bedrails?: None Help needed moving to and from a bed to a chair (including a wheelchair)?: None Help needed standing up from a chair using your arms (e.g., wheelchair or bedside chair)?: None Help needed to walk in hospital room?: A Little Help needed climbing 3-5 steps with a railing?  : A Little 6 Click Score: 22    End of Session Equipment Utilized During Treatment:  (post op shoe) Activity Tolerance: Patient tolerated treatment well Patient left: in chair;with family/visitor present;with call bell/phone within reach Nurse Communication:  (need for wedge shoe if possible) PT Visit Diagnosis: Other abnormalities of gait and mobility (R26.89)    Time: 3825-0539 PT Time Calculation (min) (ACUTE ONLY): 11 min   Charges:   PT Evaluation $PT Eval Low Complexity: 1 Low          Lavone Nian, PT, DPT 06/07/22, 3:26 PM   Waunita Schooner 06/07/2022, 3:24 PM

## 2022-06-07 NOTE — Anesthesia Postprocedure Evaluation (Signed)
Anesthesia Post Note  Patient: [name]  Procedure(s) Performed: INCISION AND DRAINAGE (Left)  Patient location during evaluation: PACU Anesthesia Type: MAC Level of consciousness: awake and alert Pain management: pain level controlled Vital Signs Assessment: post-procedure vital signs reviewed and stable Respiratory status: spontaneous breathing, nonlabored ventilation, respiratory function stable and patient connected to nasal cannula oxygen Cardiovascular status: blood pressure returned to baseline and stable Postop Assessment: no apparent nausea or vomiting Anesthetic complications: no   No notable events documented.   Last Vitals:  Vitals:   06/07/22 0432 06/07/22 0759  BP: 128/66 119/60  Pulse: (!) 59 (!) 56  Resp: 16 17  Temp: (!) 36.4 C 36.5 C  SpO2: 100% 100%    Last Pain:  Vitals:   06/06/22 2029  TempSrc:   PainSc: [place]

## 2022-06-08 ENCOUNTER — Encounter
Admission: EM | Disposition: A | Discharge status: Home / Self Care | Payer: Self-pay | Source: Home / Self Care | Attending: Internal Medicine

## 2022-06-08 ENCOUNTER — Encounter: Payer: Self-pay | Admitting: Podiatry

## 2022-06-08 ENCOUNTER — Other Ambulatory Visit: Payer: Self-pay

## 2022-06-08 DIAGNOSIS — L03116 Cellulitis of left lower limb: Secondary | ICD-10-CM | POA: Diagnosis not present

## 2022-06-08 DIAGNOSIS — I5042 Chronic combined systolic (congestive) and diastolic (congestive) heart failure: Secondary | ICD-10-CM | POA: Diagnosis not present

## 2022-06-08 DIAGNOSIS — L97529 Non-pressure chronic ulcer of other part of left foot with unspecified severity: Secondary | ICD-10-CM

## 2022-06-08 DIAGNOSIS — E11628 Type 2 diabetes mellitus with other skin complications: Secondary | ICD-10-CM | POA: Diagnosis not present

## 2022-06-08 DIAGNOSIS — I739 Peripheral vascular disease, unspecified: Secondary | ICD-10-CM | POA: Diagnosis not present

## 2022-06-08 DIAGNOSIS — I70245 Atherosclerosis of native arteries of left leg with ulceration of other part of foot: Secondary | ICD-10-CM

## 2022-06-08 HISTORY — PX: LOWER EXTREMITY ANGIOGRAPHY: CATH118251

## 2022-06-08 LAB — BASIC METABOLIC PANEL
Anion gap: 6 (ref 5–15)
BUN: 48 mg/dL — ABNORMAL HIGH (ref 6–20)
CO2: 25 mmol/L (ref 22–32)
Calcium: 8.6 mg/dL — ABNORMAL LOW (ref 8.9–10.3)
Chloride: 105 mmol/L (ref 98–111)
Creatinine, Ser: 1.24 mg/dL (ref 0.61–1.24)
GFR, Estimated: >60 mL/min (ref >60)
Glucose, Bld: 208 mg/dL — ABNORMAL HIGH (ref 70–99)
Potassium: 4.2 mmol/L (ref 3.5–5.1)
Sodium: 136 mmol/L (ref 135–145)

## 2022-06-08 LAB — GLUCOSE, CAPILLARY
Glucose-Capillary: 204 mg/dL — ABNORMAL HIGH (ref 70–99)
Glucose-Capillary: 157 mg/dL — ABNORMAL HIGH (ref 70–99)
Glucose-Capillary: 122 mg/dL — ABNORMAL HIGH (ref 70–99)
Glucose-Capillary: 182 mg/dL — ABNORMAL HIGH (ref 70–99)

## 2022-06-08 LAB — CBC
HCT: 29.9 % — ABNORMAL LOW (ref 39.0–52.0)
Hemoglobin: 10.1 g/dL — ABNORMAL LOW (ref 13.0–17.0)
MCH: 29.3 pg (ref 26.0–34.0)
MCHC: 33.8 g/dL (ref 30.0–36.0)
MCV: 86.7 fL (ref 80.0–100.0)
Platelets: 320 10*3/uL (ref 150–400)
RBC: 3.45 MIL/uL — ABNORMAL LOW (ref 4.22–5.81)
RDW: 13 % (ref 11.5–15.5)
WBC: 11.6 10*3/uL — ABNORMAL HIGH (ref 4.0–10.5)
nRBC: 0 % (ref 0.0–0.2)

## 2022-06-08 SURGERY — LOWER EXTREMITY ANGIOGRAPHY
Anesthesia: Moderate Sedation | Laterality: Left

## 2022-06-08 MED ORDER — FENTANYL CITRATE (PF) 100 MCG/2ML IJ SOLN
INTRAMUSCULAR | Status: AC
  Filled 2022-06-08: qty 2

## 2022-06-08 MED ORDER — CLOPIDOGREL BISULFATE 75 MG PO TABS
75.0000 mg | ORAL_TABLET | Freq: Every day | ORAL | Status: DC
  Administered 2022-06-08 – 2022-06-09 (×2): 75 mg via ORAL
  Filled 2022-06-08: qty 1

## 2022-06-08 MED ORDER — SODIUM CHLORIDE 0.9 % IV SOLN: INTRAVENOUS | Status: DC

## 2022-06-08 MED ORDER — HEPARIN SODIUM (PORCINE) 1000 UNIT/ML IJ SOLN
INTRAMUSCULAR | Status: AC
  Filled 2022-06-08: qty 10

## 2022-06-08 MED ORDER — INSULIN DETEMIR 100 UNIT/ML ~~LOC~~ SOLN
28.0000 [IU] | Freq: Every day | SUBCUTANEOUS | Status: DC
  Administered 2022-06-08: 28 [IU] via SUBCUTANEOUS
  Filled 2022-06-08 (×3): qty 0.28

## 2022-06-08 MED ORDER — MIDAZOLAM HCL 2 MG/2ML IJ SOLN
INTRAMUSCULAR | Status: AC
  Filled 2022-06-08: qty 2

## 2022-06-08 MED ORDER — INSULIN ASPART 100 UNIT/ML IJ SOLN
3.0000 [IU] | Freq: Three times a day (TID) | INTRAMUSCULAR | Status: DC
  Administered 2022-06-08 – 2022-06-09 (×4): 3 [IU] via SUBCUTANEOUS
  Filled 2022-06-08 (×4): qty 1

## 2022-06-08 MED ORDER — CLOPIDOGREL BISULFATE 75 MG PO TABS
ORAL_TABLET | ORAL | Status: AC
  Filled 2022-06-08: qty 1

## 2022-06-08 MED ORDER — FENTANYL CITRATE (PF) 100 MCG/2ML IJ SOLN
INTRAMUSCULAR | Status: DC | PRN
  Administered 2022-06-08 (×2): 50 ug via INTRAVENOUS

## 2022-06-08 MED ORDER — MIDAZOLAM HCL 2 MG/2ML IJ SOLN
INTRAMUSCULAR | Status: DC | PRN
  Administered 2022-06-08 (×2): 2 mg via INTRAVENOUS

## 2022-06-08 MED ORDER — HEPARIN SODIUM (PORCINE) 1000 UNIT/ML IJ SOLN
INTRAMUSCULAR | Status: DC | PRN
  Administered 2022-06-08: 4000 [IU] via INTRAVENOUS

## 2022-06-08 SURGICAL SUPPLY — 16 items
BALLN LUTONIX 018 4X100X130 (BALLOONS) ×1
BALLN ULTRVRSE 3X300X150 (BALLOONS) ×1
BALLN ULTRVRSE 3X300X150 OTW (BALLOONS) ×1
BALLOON LUTONIX 018 4X100X130 (BALLOONS) IMPLANT
BALLOON ULTRVRSE 3X300X150 OTW (BALLOONS) IMPLANT
CATH ANGIO 5F PIGTAIL 65CM (CATHETERS) IMPLANT
CATH NAVICROSS ANGLED 135CM (MICROCATHETER) IMPLANT
DEVICE STARCLOSE SE CLOSURE (Vascular Products) IMPLANT
GLIDEWIRE ADV .035X260CM (WIRE) IMPLANT
KIT ENCORE 26 ADVANTAGE (KITS) IMPLANT
PACK ANGIOGRAPHY (CUSTOM PROCEDURE TRAY) ×1 IMPLANT
SHEATH BRITE TIP 5FRX11 (SHEATH) IMPLANT
SHEATH RAABE 6FRX70 (SHEATH) IMPLANT
TUBING CONTRAST HIGH PRESS 72 (TUBING) IMPLANT
WIRE G V18X300CM (WIRE) IMPLANT
WIRE GUIDERIGHT .035X150 (WIRE) IMPLANT

## 2022-06-08 NOTE — Progress Notes (Signed)
[facility]                                                                              [name], is a 61 y.o. male, DOB - [birth year], RXV:[id number] Admit date - 06/02/2022    Outpatient Primary MD for the patient is [name], MD  LOS - 5  days  Chief Complaint  Patient presents with   Foot Pain       Brief summary   Patient is a 61 year old male with diabetes mellitus, CKD stage IIIb, CAD status post CABG, history of chronic combined systolic and diastolic dysfunction, HTN was recently discharged on 05/30/2022, for cellulitis and diabetic foot infection involving the left foot. Patient followed up with his podiatrist outpatient and was noted to have findings concerning for early gangrene involving his left foot and were advised to return to ED CBC showed WBC count of 16.3 Imaging showed no radiographic evidence of osteomyelitis, patient was placed on IV antibiotics and admitted for further work-up.   Assessment & Plan    Principal Problem: Left diabetic foot infection ([place]), gangrene left forefoot, possible osteomyelitis left first MTP joint in the setting of severe PVD -Foot x-ray, left showed no acute osteomyelitis -ABIs that showed resting ABI within normal limits on the right, on the left midrange arterial occlusive disease.  Holding off on angiogram due to AKI, creatinine improving - repeat MRI showed abscess in the 1st/2nd interspace, left 2nd MTP joint with possible osteomyelitis -Underwent left foot I&D, left second MTP joint with bone biopsy and culture on 11/3 -Continue IV Unasyn   Severe PAD -Vascular surgery following, angiogram placed on hold due to AKI -Continue to hold losartan, torsemide, Zaroxolyn -Creatinine 2.4 on admission-> improved to 1.2 -Plan for angiogram today   Acute kidney injury superimposed on CKD stage IIIb -Baseline creatinine between 1.9-2.0, creatinine was 1.5 on 10/28 -Creatinine 2.4 on  admission -Losartan, torsemide, Zaroxolyn placed on hold. - Cr Stable 1.2    Chronic combined systolic (congestive) and diastolic (congestive) heart failure ([place]) -2D echo 5/23 had shown EF of 45 to 50% -Stable and euvolemic, negative balance of 5.9 L -Continue to hold losartan, torsemide, Zaroxolyn     Type 2 diabetes mellitus, uncontrolled with diabetic chronic kidney disease ([place]) -Continue SSI, increased Levemir to 28 units daily, added NovoLog meal coverage 3 units 3 times daily AC  Hemoglobin A1c 6.9 on 05/27/2022 Recent Labs    06/07/22 0801 06/07/22 1127 06/07/22 1603 06/07/22 2117 06/08/22 0742 06/08/22 1230  GLUCAP 143* 237* 273* 352* 204* 157*      Hypertension, CAD status post CABG -BP currently soft, hold torsemide, metolazone, losartan -Continue metoprolol  Hypokalemia K 3.3 on 11/1, replaced -Resolved    Obesity (BMI 30-39.9) Estimated body mass index is 32.01 kg/m as calculated from the following:   Height as of this encounter: '5\' 8"'$  (1.727 m).   Weight as of this encounter: 95.5 kg.  Code Status: Full code DVT Prophylaxis:     Level of Care: Level of care: Med-Surg Family Communication: Updated patient  Disposition Plan:      Remains inpatient appropriate:   Procedures:  11/3 -Underwent left foot I&D, left second MTP joint with bone biopsy and culture Consultants:   Vascular surgery  Podiatry   Antimicrobials:   Anti-infectives (From admission, onward)    Start     Dose/Rate Route Frequency Ordered Stop   06/05/22 2200  Ampicillin-Sulbactam (UNASYN) 3 g in sodium chloride 0.9 % 100 mL IVPB        3 g 200 mL/hr over 30 Minutes Intravenous Every 6 hours 06/05/22 1349     06/05/22 1850  vancomycin (VANCOCIN) powder  Status:  Discontinued          As needed 06/05/22 1851 06/05/22 1923   06/05/22 1500  Ampicillin-Sulbactam (UNASYN) 3 g in sodium chloride 0.9 % 100 mL IVPB  Status:  Discontinued        3 g 200 mL/hr over 30 Minutes  Intravenous Every 6 hours 06/05/22 1347 06/05/22 1349   06/05/22 1100  vancomycin (VANCOREADY) IVPB 1500 mg/300 mL  Status:  Discontinued        1,500 mg 150 mL/hr over 120 Minutes Intravenous Every 24 hours 06/05/22 0950 06/05/22 1347   06/05/22 1000  vancomycin (VANCOREADY) IVPB 1250 mg/250 mL  Status:  Discontinued        1,250 mg 166.7 mL/hr over 90 Minutes Intravenous Every 24 hours 06/04/22 1532 06/05/22 0950   06/04/22 1300  vancomycin (VANCOREADY) IVPB 1500 mg/300 mL  Status:  Discontinued        1,500 mg 150 mL/hr over 120 Minutes Intravenous Every 48 hours 06/02/22 1507 06/03/22 1129   06/04/22 0800  vancomycin (VANCOREADY) IVPB 1750 mg/350 mL  Status:  Discontinued        1,750 mg 175 mL/hr over 120 Minutes Intravenous Every 48 hours 06/03/22 1129 06/04/22 1532   06/02/22 1400  metroNIDAZOLE (FLAGYL) tablet 500 mg  Status:  Discontinued        500 mg Oral Every 8 hours 06/02/22 1150 06/05/22 1347   06/02/22 1230  cefTRIAXone (ROCEPHIN) 2 g in sodium chloride 0.9 % 100 mL IVPB  Status:  Discontinued        2 g 200 mL/hr over 30 Minutes Intravenous Every 24 hours 06/02/22 1150 06/05/22 1347   06/02/22 1215  vancomycin (VANCOREADY) IVPB 2000 mg/400 mL  Status:  Discontinued        2,000 mg 200 mL/hr over 120 Minutes Intravenous  Once 06/02/22 1208 06/02/22 1209   06/02/22 1215  vancomycin (VANCOCIN) IVPB 1000 mg/200 mL premix        1,000 mg 200 mL/hr over 60 Minutes Intravenous  Once 06/02/22 1209 06/02/22 1538   06/02/22 0945  vancomycin (VANCOCIN) IVPB 1000 mg/200 mL premix        1,000 mg 200 mL/hr over 60 Minutes Intravenous  Once 06/02/22 0944 06/02/22 1134          Medications  amiodarone  200 mg Oral Daily   vitamin C  500 mg Oral BID   aspirin EC  81 mg Oral Daily   B-complex with vitamin C  1 tablet Oral Daily   enoxaparin (LOVENOX) injection  0.5 mg/kg Subcutaneous Q24H   gabapentin  100 mg Oral BID   insulin aspart  0-15 Units Subcutaneous TID WC    insulin detemir  24 Units Subcutaneous QHS   metoprolol succinate  50 mg Oral Daily   multivitamin with minerals  1 tablet Oral Daily   nutrition supplement (JUVEN)  1 packet Oral BID BM   omega-3 acid ethyl esters  1,000 mg  Oral BID   pramipexole  1.5 mg Oral QHS   Ensure Max Protein  11 oz Oral QHS   rosuvastatin  20 mg Oral Daily   sodium chloride flush  3 mL Intravenous Q12H   zinc sulfate  220 mg Oral Daily      Subjective:   Ricardo Brown was seen and examined today.  No acute complaints, awaiting angiogram today.  No pain in the left foot.   objective:   Vitals:   06/06/22 1953 06/07/22 0020 06/07/22 0432 06/07/22 0759  BP: 131/63 (!) 117/59 128/66 119/60  Pulse: 63 61 (!) 59 (!) 56  Resp: '17 16 16 17  '$ Temp: 97.6 F (36.4 C) 97.6 F (36.4 C) (!) 97.5 F (36.4 C) 97.7 F (36.5 C)  TempSrc:      SpO2: 100% 99% 100% 100%  Weight:      Height:        Intake/Output Summary (Last 24 hours) at 06/07/2022 1424 Last data filed at 06/07/2022 0717 Gross per 24 hour  Intake 203 ml  Output 1000 ml  Net -797 ml     Wt Readings from Last 3 Encounters:  06/02/22 95.5 kg  05/30/22 95.5 kg  04/30/22 94.9 kg   Physical Exam General: Alert and oriented x 3, NAD Cardiovascular: S1 S2 clear, RRR.  Respiratory: CTAB, no wheezing, rales or rhonchi Gastrointestinal: Soft, nontender, nondistended, NBS Ext: no pedal edema bilaterally, left foot dressing intact   Data Reviewed:  I have personally reviewed following labs    CBC Lab Results  Component Value Date   WBC 13.1 (H) 06/06/2022   RBC 3.31 (L) 06/06/2022   HGB 9.8 (L) 06/06/2022   HCT 29.3 (L) 06/06/2022   MCV 88.5 06/06/2022   MCH 29.6 06/06/2022   PLT 329 06/06/2022   MCHC 33.4 06/06/2022   RDW 13.1 06/06/2022   LYMPHSABS 1.1 05/27/2022   MONOABS 1.0 05/27/2022   EOSABS 0.0 05/27/2022   BASOSABS 0.0 09/81/1914     Last metabolic panel Lab Results  Component Value Date   NA 135 06/06/2022   K  3.9 06/06/2022   CL 104 06/06/2022   CO2 25 06/06/2022   BUN 45 (H) 06/06/2022   CREATININE 1.43 (H) 06/06/2022   GLUCOSE 194 (H) 06/06/2022   GFRNONAA 56 (L) 06/06/2022   GFRAA 69 09/30/2020   CALCIUM 8.6 (L) 06/06/2022   PHOS 3.9 05/27/2022   PROT 7.7 06/02/2022   ALBUMIN 3.2 (L) 06/02/2022   BILITOT 0.6 06/02/2022   ALKPHOS 63 06/02/2022   AST 78 (H) 06/02/2022   ALT 73 (H) 06/02/2022   ANIONGAP 6 06/06/2022    CBG (last 3)  Recent Labs    06/06/22 2128 06/07/22 0801 06/07/22 1127  GLUCAP 301* 143* 237*      Coagulation Profile: No results for input(s): "INR", "PROTIME" in the last 168 hours.    Radiology Studies: I have personally reviewed the imaging studies  No results found.     Estill Cotta M.D. [facility] 06/07/2022, 2:24 PM  Available via Epic secure chat 7am-7pm After 7 pm, please refer to night coverage provider listed on amion.

## 2022-06-08 NOTE — Op Note (Signed)
[facility]  Percutaneous Study/Intervention Procedural Note   Date of Surgery: 06/08/2022  Surgeon(s):Tajah Noguchi    Assistants:none  Pre-operative Diagnosis: PAD with ulceration and infection of the left foot  Post-operative diagnosis:  Same  Procedure(s) Performed:             1.  Ultrasound guidance for vascular access right femoral artery             2.  Catheter placement into left SFA from right femoral approach             3.  Aortogram and selective left lower extremity angiogram             4.  Percutaneous transluminal angioplasty of left posterior tibial artery and tibioperoneal trunk with 3 mm diameter angioplasty balloon             5.  Percutaneous transluminal angioplasty of the left tibioperoneal trunk with 4 mm diam Lutonix drug-coated angioplasty  6.  Percutaneous transluminal angioplasty of the left anterior tibial artery with 3 mm angioplasty balloon             7.  StarClose closure device right femoral artery  EBL: 10 cc  Contrast: 55 cc  Fluoro Time: 6.2 minutes  Moderate Conscious Sedation Time: approximately 42 minutes using 4 mg of Versed and 100 mcg of Fentanyl              Indications:  Patient is a 61 y.o.male with ulceration and infection of the left foot with poor flow at the time of surgery. The patient is brought in for angiography for further evaluation and potential treatment.  Due to the limb threatening nature of the situation, angiogram was performed for attempted limb salvage. The patient is aware that if the procedure fails, amputation would be expected.  The patient also understands that even with successful revascularization, amputation may still be required due to the severity of the situation.  Risks and benefits are discussed and informed consent is obtained.   Procedure:  The patient was identified and appropriate procedural time out was performed.  The patient was then placed supine on the table and prepped and  draped in the usual sterile fashion. Moderate conscious sedation was administered during a face to face encounter with the patient throughout the procedure with my supervision of the RN administering medicines and monitoring the patient's vital signs, pulse oximetry, telemetry and mental status throughout from the start of the procedure until the patient was taken to the recovery room. Ultrasound was used to evaluate the right common femoral artery.  It was patent .  A digital ultrasound image was acquired.  A Seldinger needle was used to access the right common femoral artery under direct ultrasound guidance and a permanent image was performed.  A 0.035 J wire was advanced without resistance and a 5Fr sheath was placed.  Pigtail catheter was placed into the aorta and an AP aortogram was performed. This demonstrated normal renal arteries and normal aorta and iliac segments without significant stenosis. I then crossed the aortic bifurcation and advanced to the left femoral head.  With a high femoral bifurcation, this was in the proximal left SFA.  Selective left lower extremity angiogram was then performed. This demonstrated fairly normal common femoral artery, profunda femoris artery, superficial femoral and popliteal arteries.  There was a fairly normal tibial trifurcation with a long tibioperoneal trunk that was diseased with stenosis in the 70% range.  There was stenosis  at the origin of the posterior tibial artery in the 80% range but then it was continuous to the foot following this.  Peroneal artery was patent but small.  The anterior tibial artery had multiple areas of high-grade stenosis or short segment occlusion but was small after reconstitution distally and did go to the foot. It was felt that it was in the patient's best interest to proceed with intervention after these images to avoid a second procedure and a larger amount of contrast and fluoroscopy based off of the findings from the initial  angiogram. The patient was systemically heparinized and a 6 French 70 cm sheath was then placed over the Terumo Advantage wire. I then used a Kumpe catheter and the advantage wire to navigate through the tibioperoneal trunk and proximal posterior tibial artery stenoses and confirm intraluminal flow in the mid posterior tibial artery and exchanged for a V18 wire.  I then used a 3 mm diameter by 30 cm length angioplasty balloon to address both areas both the posterior tibial artery and the tibioperoneal trunk although slightly undersized in the tibioperoneal trunk.  This was inflated to 10 atm for 1 minute.  The tibioperoneal trunk was then addressed separately with a 4 mm diameter by 10 cm length Lutonix drug-coated angioplasty balloon inflated to 8 atm for 1 following this, both areas were markedly improved with less than 30% residual stenosis.  I then turned my attention to the anterior tibial artery.  Using the advantage wire and a Nava cross catheter I was able to get into the anterior tibial artery and cross the stenoses and occlusions without difficulty confirming intraluminal flow in the anterior tibial artery at the ankle with a Nava cross catheter.  I then replaced the V18 wire.  A 3 mm diameter by 30 cm length angioplasty balloon was then deployed from the distal anterior tibial artery up to the anterior tibial artery origin and taken up to 10 atm for 1 minute.  Completion imaging showed marked improvement with less than 20% residual stenosis in the areas treated, although the vessel did remain small and diseased but. I elected to terminate the procedure. The sheath was removed and StarClose closure device was deployed in the right femoral artery with excellent hemostatic result. The patient was taken to the recovery room in stable condition having tolerated the procedure well.  Findings:               Aortogram:  This demonstrated normal renal arteries and normal aorta and iliac segments without  significant stenosis.             Left lower Extremity:  This demonstrated fairly normal common femoral artery, profunda femoris artery, superficial femoral and popliteal arteries.  There was a fairly normal tibial trifurcation with a long tibioperoneal trunk that was diseased with stenosis in the 70% range.  There was stenosis at the origin of the posterior tibial artery in the 80% range but then it was continuous to the foot following this.  Peroneal artery was patent but small.  The anterior tibial artery had multiple areas of high-grade stenosis or short segment occlusion but was small after reconstitution distally and did go to the foot.   Disposition: Patient was taken to the recovery room in stable condition having tolerated the procedure well.  Complications: None  Leotis Pain 06/08/2022 5:49 PM   This note was created with Dragon Medical transcription system. Any errors in dictation are purely unintentional.

## 2022-06-08 NOTE — Progress Notes (Signed)
PT Cancellation Note  Patient Details Name: Ricardo Brown MRN: [id number] DOB: 10-25-1960   Cancelled Treatment:    Reason Eval/Treat Not Completed: Other (comment). Awaiting wedge shoe, PT to re-attempt as able.    Lieutenant Diego PT, DPT 9:47 AM,06/08/22

## 2022-06-08 NOTE — TOC Progression Note (Signed)
Transition of Care [facility]) - Progression Note    Patient Details  Name: Ricardo Brown MRN: [id number] Date of Birth: 1960/09/12  Transition of Care [facility]) CM/SW [place], RN Phone Number: 06/08/2022, 3:22 PM  Clinical Narrative:     TOC to continue to follow for needs, Underwent left foot I&D, left second MTP joint with bone biopsy and culture on 11/3   Expected Discharge Plan: Home/Self Care Barriers to Discharge: Continued Medical Work up  Expected Discharge Plan and Services Expected Discharge Plan: Home/Self Care   Discharge Planning Services: CM Consult Post Acute Care Choice: NA Living arrangements for the past 2 months: [place] Determinants of Health (SDOH) Interventions    Readmission Risk Interventions    05/29/2022   11:31 AM  Readmission Risk Prevention Plan  Transportation Screening Complete  PCP or Specialist Appt within 5-7 Days Complete  Medication Review (RN CM) Complete

## 2022-06-08 NOTE — Progress Notes (Addendum)
Physical Therapy Treatment Patient Details Name: Ricardo Brown MRN: [id number] DOB: Jan 07, 1961 Today's Date: 06/08/2022   History of Present Illness Pt is a 61 y/o M admitted on 06/02/22. Pt was recently d/c on 05/30/22 for cellulitis & L diabetic foot infection. Pt f/u with outpatient podiatry & there was concern for early gangrene so pt was advised to return to ED. Pt underwent L foot I&D with bone biopsy. PMH: DM, CKD3B, CAD s/p CABG, chronic combined systolic & diastolic dysfunction, HTN    PT Comments    Pt found in chair upon PT entry with Orthowedge donned. Pt educated on PWB on left heel with Orthowedge and verbalized/demonstrated understanding. Sit<>stand with supervision. Pt ambulated 257f with RW, CGA, and min VC for PWB precautions. Pt would benefit from skilled physical therapy to address listed deficits (see below) to increase independence with ADLs and functionality. Current recommendation is no PT follow up due to pt's current mobility status.     Recommendations for follow up therapy are one component of a multi-disciplinary discharge planning process, led by the attending physician.  Recommendations may be updated based on patient status, additional functional criteria and insurance authorization.  Follow Up Recommendations  No PT follow up     Assistance Recommended at Discharge PRN  Patient can return home with the following A little help with walking and/or transfers;A little help with bathing/dressing/bathroom;Assist for transportation;Help with stairs or ramp for entrance;Assistance with cooking/housework   Equipment Recommendations  None recommended by PT    Recommendations for Other Services       Precautions / Restrictions Precautions Precautions: Other (comment) Precaution Comments: PWB on right heel with orthowedge Required Braces or Orthoses: Other Brace Other Brace: orthowedge Restrictions Weight Bearing Restrictions: Yes RUE Weight  Bearing: Weight bearing as tolerated LUE Weight Bearing: Weight bearing as tolerated RLE Weight Bearing: Weight bearing as tolerated LLE Weight Bearing: Partial weight bearing LLE Partial Weight Bearing Percentage or Pounds: through heel in post op wedge shoe     Mobility  Bed Mobility               General bed mobility comments: pt foundd and left in recliner    Transfers     Transfers: Sit to/from Stand Sit to Stand: Min guard           General transfer comment: STS from recliner without AD    Ambulation/Gait Ambulation/Gait assistance: Min guard Gait Distance (Feet): 220 Feet Assistive device: Rolling walker (2 wheels) Gait Pattern/deviations: Decreased step length - right, Decreased step length - left, Decreased stride length, Step-through pattern       General Gait Details: RW used to encourage PWB precautions   Stairs             Wheelchair Mobility    Modified Rankin (Stroke Patients Only)       Balance Overall balance assessment: Needs assistance Sitting-balance support: Feet supported Sitting balance-Leahy Scale: Good       Standing balance-Leahy Scale: Fair Standing balance comment: some unsteadiness noted due to oCiscoArousal/Alertness: Awake/alert Behavior During Therapy: WFL for tasks assessed/performed Overall Cognitive Status: Within Functional Limits for tasks assessed  Exercises      General Comments        Pertinent Vitals/Pain Pain Assessment Pain Assessment: No/denies pain    Home Living                          Prior Function            PT Goals (current goals can now be found in the care plan section) Acute Rehab PT Goals Patient Stated Goal: to return home PT Goal Formulation: With patient Time For Goal Achievement: 06/21/22 Potential to Achieve Goals: Good Progress towards PT  goals: Progressing toward goals    Frequency    7X/week      PT Plan Current plan remains appropriate    Co-evaluation              AM-PAC PT "6 Clicks" Mobility   Outcome Measure  Help needed turning from your back to your side while in a flat bed without using bedrails?: None Help needed moving from lying on your back to sitting on the side of a flat bed without using bedrails?: None Help needed moving to and from a bed to a chair (including a wheelchair)?: None Help needed standing up from a chair using your arms (e.g., wheelchair or bedside chair)?: None Help needed to walk in hospital room?: A Little Help needed climbing 3-5 steps with a railing? : A Little 6 Click Score: 22    End of Session Equipment Utilized During Treatment: Other (comment) (orthowedge) Activity Tolerance: Patient tolerated treatment well Patient left: in chair;with family/visitor present;with call bell/phone within reach Nurse Communication: Mobility status PT Visit Diagnosis: Other abnormalities of gait and mobility (R26.89)     Time: 7673-4193 PT Time Calculation (min) (ACUTE ONLY): 9 min  Charges:                           Claiborne Billings O'Daniel, SPT 06/08/2022, 2:15 PM

## 2022-06-09 ENCOUNTER — Encounter: Payer: Self-pay | Admitting: Vascular Surgery

## 2022-06-09 DIAGNOSIS — Z9889 Other specified postprocedural states: Secondary | ICD-10-CM

## 2022-06-09 DIAGNOSIS — I97618 Postprocedural hemorrhage and hematoma of a circulatory system organ or structure following other circulatory system procedure: Secondary | ICD-10-CM

## 2022-06-09 LAB — BASIC METABOLIC PANEL
Anion gap: 5 (ref 5–15)
BUN: 36 mg/dL — ABNORMAL HIGH (ref 6–20)
CO2: 25 mmol/L (ref 22–32)
Calcium: 8.5 mg/dL — ABNORMAL LOW (ref 8.9–10.3)
Chloride: 107 mmol/L (ref 98–111)
Creatinine, Ser: 1.18 mg/dL (ref 0.61–1.24)
GFR, Estimated: >60 mL/min (ref >60)
Glucose, Bld: 186 mg/dL — ABNORMAL HIGH (ref 70–99)
Potassium: 4.2 mmol/L (ref 3.5–5.1)
Sodium: 137 mmol/L (ref 135–145)

## 2022-06-09 LAB — CBC
HCT: 28.8 % — ABNORMAL LOW (ref 39.0–52.0)
Hemoglobin: 9.5 g/dL — ABNORMAL LOW (ref 13.0–17.0)
MCH: 28.9 pg (ref 26.0–34.0)
MCHC: 33 g/dL (ref 30.0–36.0)
MCV: 87.5 fL (ref 80.0–100.0)
Platelets: 306 10*3/uL (ref 150–400)
RBC: 3.29 MIL/uL — ABNORMAL LOW (ref 4.22–5.81)
RDW: 13.2 % (ref 11.5–15.5)
WBC: 9.4 10*3/uL (ref 4.0–10.5)
nRBC: 0 % (ref 0.0–0.2)

## 2022-06-09 LAB — GLUCOSE, CAPILLARY
Glucose-Capillary: 137 mg/dL — ABNORMAL HIGH (ref 70–99)
Glucose-Capillary: 164 mg/dL — ABNORMAL HIGH (ref 70–99)
Glucose-Capillary: 239 mg/dL — ABNORMAL HIGH (ref 70–99)

## 2022-06-09 LAB — HEMOGLOBIN AND HEMATOCRIT, BLOOD
HCT: 26.9 % — ABNORMAL LOW (ref 39.0–52.0)
Hemoglobin: 9 g/dL — ABNORMAL LOW (ref 13.0–17.0)

## 2022-06-09 LAB — SURGICAL PATHOLOGY

## 2022-06-09 MED ORDER — METOLAZONE 2.5 MG PO TABS: 2.5000 mg | ORAL_TABLET | Freq: Every day | ORAL | 4 refills | Status: DC

## 2022-06-09 MED ORDER — TORSEMIDE 20 MG PO TABS: 40.0000 mg | ORAL_TABLET | Freq: Every day | ORAL | Status: DC

## 2022-06-09 MED ORDER — CLOPIDOGREL BISULFATE 75 MG PO TABS: 75.0000 mg | ORAL_TABLET | Freq: Every day | ORAL | 3 refills | Status: DC

## 2022-06-09 MED ORDER — LOSARTAN POTASSIUM 25 MG PO TABS: 25.0000 mg | ORAL_TABLET | Freq: Every day | ORAL | Status: DC

## 2022-06-09 MED ORDER — ASCORBIC ACID 500 MG PO TABS: 500.0000 mg | ORAL_TABLET | Freq: Two times a day (BID) | ORAL | 3 refills | Status: AC

## 2022-06-09 MED ORDER — DOXYCYCLINE MONOHYDRATE 100 MG PO TABS: 100.0000 mg | ORAL_TABLET | Freq: Two times a day (BID) | ORAL | 0 refills | Status: AC

## 2022-06-09 NOTE — Progress Notes (Signed)
[facility]  Daily Progress Note   Subjective  -   The patient underwent left lower extremity angiogram and subsequently developed constant oozing following movement this morning.  Objective Vitals:   06/08/22 1815 06/08/22 1901 06/08/22 2340 06/09/22 0821  BP: 120/63 137/82 107/65 (!) 141/74  Pulse: 71 72 72 79  Resp: '13  18 16  '$ Temp:  97.9 F (36.6 C) 97.9 F (36.6 C) 97.7 F (36.5 C)  TempSrc:      SpO2: 97% 100% 98% 100%  Weight:      Height:       No intake or output data in the 24 hours ending 06/09/22 1004  PULM  CTAB CV  RRR VASC  oozing right groin  Laboratory CBC    Component Value Date/Time   WBC 9.4 06/09/2022 0617   HGB 9.5 (L) 06/09/2022 0617   HGB 13.0 08/20/2021 0632   HCT 28.8 (L) 06/09/2022 0617   HCT 38.0 08/20/2021 0632   PLT 306 06/09/2022 0617   PLT 197 08/20/2021 0632    BMET    Component Value Date/Time   NA 137 06/09/2022 0617   NA 136 08/26/2021 0952   K 4.2 06/09/2022 0617   CL 107 06/09/2022 0617   CO2 25 06/09/2022 0617   GLUCOSE 186 (H) 06/09/2022 0617   BUN 36 (H) 06/09/2022 0617   BUN 69 (H) 08/26/2021 0952   CREATININE 1.18 06/09/2022 0617   CREATININE 1.31 09/30/2020 1114   CALCIUM 8.5 (L) 06/09/2022 0617   GFRNONAA >60 06/09/2022 0617   GFRNONAA 59 (L) 09/30/2020 1114   GFRAA 69 09/30/2020 1114    Assessment/Planning: POD #1 s/p left lower extremity angiogram  The patient had bleeding from his right groin post procedure this morning following movement This was a trickling ooze.  We injected lidocaine with epinephrine around the periwound area and this stopped the oozing.  A pressure dressing was applied.  Patient instructed to remain on bedrest for the next 4 hours as well as to keep the head of bed less than 30 degrees. Following bedrest, if oozing has continued to remain stable the patient should be stable for discharge from a vascular perspective with Plavix and aspirin  Kris Hartmann  06/09/2022, 10:04 AM

## 2022-06-09 NOTE — Plan of Care (Signed)

## 2022-06-09 NOTE — Progress Notes (Signed)
[facility]                                                                              Ricardo Brown, is a 61 y.o. male, DOB - 03/05/1961, ZJI:[id number] Admit date - 06/02/2022    Outpatient Primary MD for the patient is Ricardo Athens, MD  LOS - 7  days  Chief Complaint  Patient presents with   Foot Pain       Brief summary   Patient is a 61 year old male with diabetes mellitus, CKD stage IIIb, CAD status post CABG, history of chronic combined systolic and diastolic dysfunction, HTN was recently discharged on 05/30/2022, for cellulitis and diabetic foot infection involving the left foot. Patient followed up with his podiatrist outpatient and was noted to have findings concerning for early gangrene involving his left foot and were advised to return to ED CBC showed WBC count of 16.3 Imaging showed no radiographic evidence of osteomyelitis, patient was placed on IV antibiotics and admitted for further work-up.   Assessment & Plan    Principal Problem: Left diabetic foot infection ([place]), gangrene left forefoot, possible osteomyelitis left first MTP joint in the setting of severe PVD -Foot x-ray, left showed no acute osteomyelitis -ABIs that showed resting ABI within normal limits on the right, on the left midrange arterial occlusive disease.  Holding off on angiogram due to AKI, creatinine improving - repeat MRI showed abscess in the 1st/2nd interspace, left 2nd MTP joint with possible osteomyelitis -Underwent left foot I&D, left second MTP joint with bone biopsy and culture on 11/3 -Continue IV Unasyn, transition to PO to Augmentin upon discharge   Severe PAD -Vascular surgery following, angiogram placed on hold due to AKI -Continue to hold losartan, torsemide, Zaroxolyn -Underwent left lower extremity angiogram on 11/6, this morning developed constant bleeding from the site.  -Vascular reconsulted, repeat hemoglobin trending down 9.0 -Currently on  aspirin and Plavix   Acute kidney injury superimposed on CKD stage IIIb -Baseline creatinine between 1.9-2.0, creatinine was 1.5 on 10/28 -Creatinine 2.4 on admission -Losartan, torsemide, Zaroxolyn placed on hold. -Creatinine 1.1, stable, resume torsemide in a.m.    Chronic combined systolic (congestive) and diastolic (congestive) heart failure ([place]) -2D echo 5/23 had shown EF of 45 to 50% -Stable and euvolemic, negative balance of 5.9 L -Continue to hold losartan, torsemide, Zaroxolyn -Resume torsemide in a.m.     Type 2 diabetes mellitus, uncontrolled with diabetic chronic kidney disease ([place]) -Continue Levemir 28 units daily, NovoLog meal coverage 3 units 3 times daily AC, SSI  -Hemoglobin A1c 6.9 on 05/27/2022 Recent Labs    06/08/22 1230 06/08/22 1533 06/08/22 1806 06/08/22 2044 06/09/22 0823 06/09/22 1225  GLUCAP 157* 137* 122* 182* 164* 239*      Hypertension, CAD status post CABG -BP stable, continue metoprolol, resume torsemide in a.m.  -Metolazone and losartan currently on hold  Hypokalemia K 3.3 on 11/1, replaced -Resolved    Obesity (BMI 30-39.9) Estimated body mass index is 32.01 kg/m as calculated from the following:   Height as of this encounter: '5\' 8"'$  (1.727 m).   Weight as of this encounter: 95.5 kg.  Code  Status: Full code DVT Prophylaxis:     Level of Care: Level of care: Med-Surg Family Communication: Updated patient  Disposition Plan:      Remains inpatient appropriate: Plan to DC home in a.m. if no further bleeding from the femoral site and H&H remained stable in a.m.  Procedures:  11/3 -Underwent left foot I&D, left second MTP joint with bone biopsy and culture Consultants:   Vascular surgery  Podiatry   Antimicrobials:   Anti-infectives (From admission, onward)    Start     Dose/Rate Route Frequency Ordered Stop   06/05/22 2200  Ampicillin-Sulbactam (UNASYN) 3 g in sodium chloride 0.9 % 100 mL IVPB        3 g 200 mL/hr over 30  Minutes Intravenous Every 6 hours 06/05/22 1349     06/05/22 1850  vancomycin (VANCOCIN) powder  Status:  Discontinued          As needed 06/05/22 1851 06/05/22 1923   06/05/22 1500  Ampicillin-Sulbactam (UNASYN) 3 g in sodium chloride 0.9 % 100 mL IVPB  Status:  Discontinued        3 g 200 mL/hr over 30 Minutes Intravenous Every 6 hours 06/05/22 1347 06/05/22 1349   06/05/22 1100  vancomycin (VANCOREADY) IVPB 1500 mg/300 mL  Status:  Discontinued        1,500 mg 150 mL/hr over 120 Minutes Intravenous Every 24 hours 06/05/22 0950 06/05/22 1347   06/05/22 1000  vancomycin (VANCOREADY) IVPB 1250 mg/250 mL  Status:  Discontinued        1,250 mg 166.7 mL/hr over 90 Minutes Intravenous Every 24 hours 06/04/22 1532 06/05/22 0950   06/04/22 1300  vancomycin (VANCOREADY) IVPB 1500 mg/300 mL  Status:  Discontinued        1,500 mg 150 mL/hr over 120 Minutes Intravenous Every 48 hours 06/02/22 1507 06/03/22 1129   06/04/22 0800  vancomycin (VANCOREADY) IVPB 1750 mg/350 mL  Status:  Discontinued        1,750 mg 175 mL/hr over 120 Minutes Intravenous Every 48 hours 06/03/22 1129 06/04/22 1532   06/02/22 1400  metroNIDAZOLE (FLAGYL) tablet 500 mg  Status:  Discontinued        500 mg Oral Every 8 hours 06/02/22 1150 06/05/22 1347   06/02/22 1230  cefTRIAXone (ROCEPHIN) 2 g in sodium chloride 0.9 % 100 mL IVPB  Status:  Discontinued        2 g 200 mL/hr over 30 Minutes Intravenous Every 24 hours 06/02/22 1150 06/05/22 1347   06/02/22 1215  vancomycin (VANCOREADY) IVPB 2000 mg/400 mL  Status:  Discontinued        2,000 mg 200 mL/hr over 120 Minutes Intravenous  Once 06/02/22 1208 06/02/22 1209   06/02/22 1215  vancomycin (VANCOCIN) IVPB 1000 mg/200 mL premix        1,000 mg 200 mL/hr over 60 Minutes Intravenous  Once 06/02/22 1209 06/02/22 1538   06/02/22 0945  vancomycin (VANCOCIN) IVPB 1000 mg/200 mL premix        1,000 mg 200 mL/hr over 60 Minutes Intravenous  Once 06/02/22 0944 06/02/22 1134           Medications  amiodarone  200 mg Oral Daily   vitamin C  500 mg Oral BID   aspirin EC  81 mg Oral Daily   B-complex with vitamin C  1 tablet Oral Daily   clopidogrel  75 mg Oral Daily   enoxaparin (LOVENOX) injection  0.5 mg/kg Subcutaneous Q24H   gabapentin  100 mg Oral BID   insulin aspart  0-15 Units Subcutaneous TID WC   insulin aspart  3 Units Subcutaneous TID WC   insulin detemir  28 Units Subcutaneous QHS   metoprolol succinate  50 mg Oral Daily   multivitamin with minerals  1 tablet Oral Daily   nutrition supplement (JUVEN)  1 packet Oral BID BM   omega-3 acid ethyl esters  1,000 mg Oral BID   pramipexole  1.5 mg Oral QHS   Ensure Max Protein  11 oz Oral QHS   rosuvastatin  20 mg Oral Daily   sodium chloride flush  3 mL Intravenous Q12H   zinc sulfate  220 mg Oral Daily      Subjective:   Jermane Brayboy was seen and examined today.  Status post angiogram on 11/6, this morning started bleeding from the femoral site.  No dizziness, lightheadedness or chest pain.  objective:   Vitals:   06/08/22 1815 06/08/22 1901 06/08/22 2340 06/09/22 0821  BP: 120/63 137/82 107/65 (!) 141/74  Pulse: 71 72 72 79  Resp: '13  18 16  '$ Temp:  97.9 F (36.6 C) 97.9 F (36.6 C) 97.7 F (36.5 C)  TempSrc:      SpO2: 97% 100% 98% 100%  Weight:      Height:       No intake or output data in the 24 hours ending 06/09/22 1346    Wt Readings from Last 3 Encounters:  06/02/22 95.5 kg  05/30/22 95.5 kg  04/30/22 94.9 kg   Physical Exam General: Alert and oriented x 3, NAD Cardiovascular: S1 S2 clear, RRR.  Respiratory: CTAB, no wheezing Gastrointestinal: Soft, nontender, nondistended, NBS Ext: no pedal edema bilaterally Neuro: no new deficits Skin: Left foot dressing intact, dressing on the right femoral site Psych: Normal affect   Data Reviewed:  I have personally reviewed following labs    CBC Lab Results  Component Value Date   WBC 9.4 06/09/2022   RBC  3.29 (L) 06/09/2022   HGB 9.0 (L) 06/09/2022   HCT 26.9 (L) 06/09/2022   MCV 87.5 06/09/2022   MCH 28.9 06/09/2022   PLT 306 06/09/2022   MCHC 33.0 06/09/2022   RDW 13.2 06/09/2022   LYMPHSABS 1.1 05/27/2022   MONOABS 1.0 05/27/2022   EOSABS 0.0 05/27/2022   BASOSABS 0.0 82/50/5397     Last metabolic panel Lab Results  Component Value Date   NA 137 06/09/2022   K 4.2 06/09/2022   CL 107 06/09/2022   CO2 25 06/09/2022   BUN 36 (H) 06/09/2022   CREATININE 1.18 06/09/2022   GLUCOSE 186 (H) 06/09/2022   GFRNONAA >60 06/09/2022   GFRAA 69 09/30/2020   CALCIUM 8.5 (L) 06/09/2022   PHOS 3.9 05/27/2022   PROT 7.7 06/02/2022   ALBUMIN 3.2 (L) 06/02/2022   BILITOT 0.6 06/02/2022   ALKPHOS 63 06/02/2022   AST 78 (H) 06/02/2022   ALT 73 (H) 06/02/2022   ANIONGAP 5 06/09/2022    CBG (last 3)  Recent Labs    06/08/22 2044 06/09/22 0823 06/09/22 1225  GLUCAP 182* 164* 239*      Coagulation Profile: No results for input(s): "INR", "PROTIME" in the last 168 hours.    Radiology Studies: I have personally reviewed the imaging studies  PERIPHERAL VASCULAR CATHETERIZATION  Result Date: 06/08/2022 See surgical note for result.      Estill Cotta M.D. [facility] 06/09/2022, 1:46 PM  Available via Epic secure chat 7am-7pm After 7 pm,  please refer to night coverage provider listed on amion.

## 2022-06-09 NOTE — Progress Notes (Signed)
Nutrition Follow-up  DOCUMENTATION CODES:   Obesity unspecified  INTERVENTION:   -Continue MVI with minerals daily -Continue 500 mg vitamin C BID -Continue 220 mg zinc sulfate daily x 14 days -Continue 1 packet Juven BID, each packet provides 95 calories, 2.5 grams of protein (collagen), and 9.8 grams of carbohydrate (3 grams sugar); also contains 7 grams of L-arginine and L-glutamine, 300 mg vitamin C, 15 mg vitamin E, 1.2 mcg vitamin B-12, 9.5 mg zinc, 200 mg calcium, and 1.5 g  Calcium Beta-hydroxy-Beta-methylbutyrate to support wound healing  -Continue Ensure Max po daily, each supplement provides 150 kcal and 30 grams of protein.     NUTRITION DIAGNOSIS:   Increased nutrient needs related to wound healing as evidenced by estimated needs.  Ongoing  GOAL:   Patient will meet greater than or equal to 90% of their needs  Progressing   MONITOR:   PO intake, Supplement acceptance  REASON FOR ASSESSMENT:   Consult Assessment of nutrition requirement/status, Wound healing  ASSESSMENT:   Pt with medical history significant for diabetes mellitus with complications of stage 3b CKD neuropathy, history of coronary artery disease status post CABG, history of chronic combined systolic and diastolic dysfunction CHF, hypertension who was recently discharged from the hospital on 05/30/1930 for cellulitis and diabetic foot infection involving the left foot.  11/3- s/p lt foot I&D, lt second metatarsal phalangeal joint bone biopsy and bone culture. Application of vancomycin powder  11/6- s/p aortogram and angioplasty  Pt lying in bed at time of visit, pleasant and in good spirits today. He reports he continues to have a good appetite and drink supplements. Noted meal completion 90-100%. Noted breakfast tray at bedside. Pt reports he has not been able to each much today, as he has to lay flat until noon due to recent catheter removal. Discussed importance of good meal and supplement intake  to promote healing. Reviewed nutritional plan of care and pt agreeable to continue with prescribed supplements and vitamins.    Medications reviewed and include vitamin C, lovenox, and zinc sulfate.  Labs reviewed: CBGS: 122-182 (inpatient orders for glycemic control are 0-15 units insulin aspart TID with meals, 3 units insulin aspart TID with meals, and 28 units insulin detemir daily at bedtime).    Diet Order:   Diet Order             Diet Carb Modified Fluid consistency: Thin; Room service appropriate? Yes  Diet effective now                   EDUCATION NEEDS:   Education needs have been addressed  Skin:  Skin Assessment: Skin Integrity Issues: Skin Integrity Issues:: Diabetic Ulcer Diabetic Ulcer: lt foot Other: -  Last BM:  06/03/22 (type 3)  Height:   Ht Readings from Last 1 Encounters:  06/02/22 '5\' 8"'$  (1.727 m)    Weight:   Wt Readings from Last 1 Encounters:  06/02/22 95.5 kg    Ideal Body Weight:  70 kg  BMI:  Body mass index is 32.01 kg/m.  Estimated Nutritional Needs:   Kcal:  1900-2100  Protein:  105-120 grams  Fluid:  > 1.9 L    Loistine Chance, RD, LDN, [place] Registered Dietitian II Certified Diabetes Care and Education Specialist Please refer to [facility] for RD and/or RD on-call/weekend/after hours pager

## 2022-06-09 NOTE — Progress Notes (Signed)
PT Cancellation Note  Patient Details Name: Ricardo Brown MRN: [id number] DOB: 19-Oct-1960   Cancelled Treatment:    Reason Eval/Treat Not Completed: Other (comment) PT witnessed pt walking with nursing. 3 loops around the nursing station completed. PT will continue as able.    Benedetta Sundstrom O'Daniel 06/09/2022, 2:09 PM

## 2022-06-09 NOTE — Plan of Care (Signed)
  Problem: Education: Goal: Knowledge of General Education information will improve Description: Including pain rating scale, medication(s)/side effects and non-pharmacologic comfort measures 06/09/2022 1449 by Alferd Apa, RN Outcome: Adequate for Discharge 06/09/2022 0747 by Alferd Apa, RN Outcome: Progressing   Problem: Health Behavior/Discharge Planning: Goal: Ability to manage health-related needs will improve 06/09/2022 1449 by Alferd Apa, RN Outcome: Adequate for Discharge 06/09/2022 0747 by Alferd Apa, RN Outcome: Progressing   Problem: Clinical Measurements: Goal: Ability to maintain clinical measurements within normal limits will improve 06/09/2022 1449 by Alferd Apa, RN Outcome: Adequate for Discharge 06/09/2022 0747 by Alferd Apa, RN Outcome: Progressing Goal: Will remain free from infection 06/09/2022 1449 by Alferd Apa, RN Outcome: Adequate for Discharge 06/09/2022 0747 by Alferd Apa, RN Outcome: Progressing Goal: Diagnostic test results will improve 06/09/2022 1449 by Alferd Apa, RN Outcome: Adequate for Discharge 06/09/2022 0747 by Alferd Apa, RN Outcome: Progressing Goal: Respiratory complications will improve 06/09/2022 1449 by Alferd Apa, RN Outcome: Adequate for Discharge 06/09/2022 0747 by Alferd Apa, RN Outcome: Progressing Goal: Cardiovascular complication will be avoided 06/09/2022 1449 by Alferd Apa, RN Outcome: Adequate for Discharge 06/09/2022 0747 by Alferd Apa, RN Outcome: Progressing   Problem: Activity: Goal: Risk for activity intolerance will decrease 06/09/2022 1449 by Alferd Apa, RN Outcome: Adequate for Discharge 06/09/2022 0747 by Alferd Apa, RN Outcome: Progressing   Problem: Nutrition: Goal: Adequate nutrition will be maintained 06/09/2022 1449 by Alferd Apa, RN Outcome: Adequate for Discharge 06/09/2022 0747 by Alferd Apa, RN Outcome: Progressing   Problem: Coping: Goal: Level of anxiety  will decrease 06/09/2022 1449 by Alferd Apa, RN Outcome: Adequate for Discharge 06/09/2022 0747 by Alferd Apa, RN Outcome: Progressing   Problem: Elimination: Goal: Will not experience complications related to bowel motility 06/09/2022 1449 by Alferd Apa, RN Outcome: Adequate for Discharge 06/09/2022 0747 by Alferd Apa, RN Outcome: Progressing Goal: Will not experience complications related to urinary retention 06/09/2022 1449 by Alferd Apa, RN Outcome: Adequate for Discharge 06/09/2022 0747 by Alferd Apa, RN Outcome: Progressing   Problem: Pain Managment: Goal: General experience of comfort will improve 06/09/2022 1449 by Alferd Apa, RN Outcome: Adequate for Discharge 06/09/2022 0747 by Alferd Apa, RN Outcome: Progressing   Problem: Safety: Goal: Ability to remain free from injury will improve 06/09/2022 1449 by Alferd Apa, RN Outcome: Adequate for Discharge 06/09/2022 0747 by Alferd Apa, RN Outcome: Progressing   Problem: Skin Integrity: Goal: Risk for impaired skin integrity will decrease 06/09/2022 1449 by Alferd Apa, RN Outcome: Adequate for Discharge 06/09/2022 0747 by Alferd Apa, RN Outcome: Progressing

## 2022-06-09 NOTE — Discharge Summary (Signed)
Physician Discharge Summary   Patient: Ricardo Brown MRN: [id number] DOB: 1960/10/08  Admit date:     06/02/2022  Discharge date: 06/09/22  Discharge Physician: Estill Cotta, MD   PCP: [name], MD   Recommendations at discharge:   Plan vascular surgery rendition started on aspirin 81 mg daily, Plavix 75 mg daily Continue doxycycline 100 mg twice daily for 2 weeks  Discharge Diagnoses:    Diabetic foot infection ([place]), osteomyelitis left first MTP joint  Severe PAD  Acute kidney injury superimposed on CKD stage IIIb   Chronic combined systolic (congestive) and diastolic (congestive) heart failure (HCC)   Type 2 diabetes mellitus with diabetic chronic kidney disease (HCC)   PAD (peripheral artery disease) (HCC)   Hypertension   Obesity (BMI 30-39.9) Hypokalemia   Hospital Course: Patient is a 61 year old male with diabetes mellitus, CKD stage IIIb, CAD status post CABG, history of chronic combined systolic and diastolic dysfunction, HTN was recently discharged on 05/30/2022, for cellulitis and diabetic foot infection involving the left foot. Patient followed up with his podiatrist outpatient and was noted to have findings concerning for early gangrene involving his left foot and were advised to return to ED CBC showed WBC count of 16.3 Imaging showed no radiographic evidence of osteomyelitis, patient was placed on IV antibiotics and admitted for further work-up.     Assessment and Plan:  Left diabetic foot infection ([place]), gangrene left forefoot, possible osteomyelitis left first MTP joint in the setting of severe PVD -Foot x-ray, left showed no acute osteomyelitis -ABIs that showed resting ABI within normal limits on the right, on the left midrange arterial occlusive disease.  Holding off on angiogram due to AKI, creatinine improving - repeat MRI showed abscess in the 1st/2nd interspace, left 2nd MTP joint with possible osteomyelitis -Underwent left foot I&D,  left second MTP joint with bone biopsy and culture on 11/3 -Previous cultures showed MSSA, intraoperative wound culture showed no growth.  Transitioned to oral doxycycline for 2 weeks -Cleared by podiatry for discharge home, follow-up with Dr. Luana Shu in 1 week.  Recommended oral antibiotics.   Severe PAD -Vascular surgery was consulted -Continue to hold losartan, torsemide, Zaroxolyn -Underwent left lower extremity angiogram on 11/6, this morning developed constant bleeding from the site.  -Vascular reconsulted, repeat hemoglobin trending down 9.0 but stable, injected lidocaine with epinephrine around the periwound area which stopped the oozing. -Currently on aspirin and Plavix cleared by vascular surgery to discharge home if no further oozing after bedrest.     Acute kidney injury superimposed on CKD stage IIIb -Baseline creatinine between 1.9-2.0, creatinine was 1.5 on 10/28 -Creatinine 2.4 on admission, creatinine now 1.1 stable -Creatinine 1.1, stable, resume torsemide  -Current need to hold his edoxaban, losartan     Chronic combined systolic (congestive) and diastolic (congestive) heart failure ([place]) -2D echo 5/23 had shown EF of 45 to 50% -Stable and euvolemic, negative balance of 5.9 L -Continue to hold losartan, Zaroxolyn -Resume torsemide       Type 2 diabetes mellitus, uncontrolled with diabetic chronic kidney disease ([place]) -Resume insulin regimen -Hemoglobin A1c 6.9 on 05/27/2022     Hypertension, CAD status post CABG -BP stable, continue metoprolol, resume torsemide -Metolazone and losartan currently on hold   Hypokalemia K 3.3 on 11/1, replaced -Resolved     Obesity (BMI 30-39.9) Estimated body mass index is 32.01 kg/m as calculated from the following:   Height as of this encounter: '5\' 8"'$  (1.727 m).   Weight as of  this encounter: 95.5 kg.     Pain control - [employer] Controlled Substance Reporting System database was reviewed. and patient was  instructed, not to drive, operate heavy machinery, perform activities at heights, swimming or participation in water activities or provide baby-sitting services while on Pain, Sleep and Anxiety Medications; until their outpatient Physician has advised to do so again. Also recommended to not to take more than prescribed Pain, Sleep and Anxiety Medications.  Consultants: Vascular surgery, podiatry Procedures performed: I&D, angiogram left lower extremity Disposition: Home Diet recommendation:  Discharge Diet Orders (From admission, onward)     Start     Ordered   06/09/22 0000  Diet Carb Modified        06/09/22 1401           Carb modified diet DISCHARGE MEDICATION: Allergies as of 06/09/2022       Reactions   Lipitor [atorvastatin] Palpitations   SVT   Sacubitril-valsartan Other (See Comments)   SVT Other reaction(s): Other (see comments) Other reaction(s): Other (See Comments) SVT SVT   Pregabalin Palpitations   SVT Other reaction(s): Other (see comments) SVT SVT        Medication List     STOP taking these medications    levofloxacin 750 MG tablet Commonly known as: Levaquin       TAKE these medications    amiodarone 200 MG tablet Commonly known as: PACERONE Take 200 mg by mouth daily.   ascorbic acid 500 MG tablet Commonly known as: VITAMIN C Take 1 tablet (500 mg total) by mouth 2 (two) times daily.   aspirin EC 81 MG tablet Take 81 mg by mouth daily.   b complex vitamins tablet Take 1 tablet by mouth daily.   clopidogrel 75 MG tablet Commonly known as: PLAVIX Take 1 tablet (75 mg total) by mouth daily. Start taking on: June 10, 2022   doxycycline 100 MG tablet Commonly known as: ADOXA Take 1 tablet (100 mg total) by mouth 2 (two) times daily for 14 days.   Dulaglutide 1.5 MG/0.5ML Sopn Inject 1.5 mg into the skin every Saturday.   empagliflozin 25 MG Tabs tablet Commonly known as: JARDIANCE Take 25 mg by mouth daily.   Fish Oil  875 MG Caps Take 875 mg by mouth 2 (two) times daily.   gabapentin 100 MG capsule Commonly known as: NEURONTIN Take 1 capsule (100 mg total) by mouth 2 (two) times daily.   insulin degludec 200 UNIT/ML FlexTouch Pen Commonly known as: TRESIBA 32 Units at bedtime.   losartan 25 MG tablet Commonly known as: COZAAR Take 1 tablet (25 mg total) by mouth daily. HOLD UNTIL FOLLOW-UP WITH YOUR DOCTOR What changed: additional instructions   metFORMIN 500 MG tablet Commonly known as: GLUCOPHAGE Take 2 tablets (1,000 mg total) by mouth 2 (two) times daily.   metolazone 2.5 MG tablet Commonly known as: ZAROXOLYN Take 1 tablet (2.5 mg total) by mouth daily. HOLD UNTIL FOLLOW-UP WITH YOUR DOCTOR What changed: additional instructions   metoprolol succinate 50 MG 24 hr tablet Commonly known as: TOPROL-XL Take 1 tablet (50 mg total) by mouth daily. Take with or immediately following a meal.   pramipexole 1.5 MG tablet Commonly known as: MIRAPEX Take 1 tablet (1.5 mg total) by mouth 2 (two) times daily.   rosuvastatin 20 MG tablet Commonly known as: CRESTOR Take 1 tablet (20 mg total) by mouth daily.   torsemide 20 MG tablet Commonly known as: DEMADEX Take 2 tablets (40 mg total)  by mouth daily. 40 mg daily               Discharge Care Instructions  (From admission, onward)           Start     Ordered   06/09/22 0000  Discharge wound care:       Comments: Keep dressing clean and dry but if becomes soiled can change as needed with dry gauze.  Follow-up with Dr. Luana Shu in 1 week.   06/09/22 1401            Follow-up Information     Caroline More, DPM. Schedule an appointment as soon as possible for a visit in 1 week(s).   Specialty: Podiatry Why: for hospital follow-up Contact information: [place] [address] [phone]         [name], MD. Schedule an appointment as soon as possible for a visit in 2 week(s).   Specialties:  Internal Medicine, Cardiology Why: for hospital follow-up Contact information: [place] [address] [phone]                Discharge Exam: Danley Danker Weights   06/02/22 1117  Weight: 95.5 kg   S: Bleeding from the femoral site has now stopped, cleared by vascular surgery and podiatry to be discharged home  Vitals:   06/08/22 1815 06/08/22 1901 06/08/22 2340 06/09/22 0821  BP: 120/63 137/82 107/65 (!) 141/74  Pulse: 71 72 72 79  Resp: '13  18 16  '$ Temp:  97.9 F (36.6 C) 97.9 F (36.6 C) 97.7 F (36.5 C)  TempSrc:      SpO2: 97% 100% 98% 100%  Weight:      Height:         Physical Exam General: Alert and oriented x 3, NAD Cardiovascular: S1 S2 clear, RRR.  Respiratory: CTAB, no wheezing, rales or rhonchi Gastrointestinal: Soft, nontender, nondistended, NBS Ext: no pedal edema bilaterally Neuro: no new deficits Skin: Left foot dressing intact Psych: Normal affect  Condition at discharge: fair  The results of significant diagnostics from this hospitalization (including imaging, microbiology, ancillary and laboratory) are listed below for reference.   Imaging Studies: PERIPHERAL VASCULAR CATHETERIZATION  Result Date: 06/08/2022 See surgical note for result.  MR FOOT LEFT WO CONTRAST  Result Date: 06/05/2022 CLINICAL DATA:  Foot swelling, diabetic, osteomyelitis suspected, xray done EXAM: MRI OF THE LEFT FOOT WITHOUT CONTRAST TECHNIQUE: Multiplanar, multisequence MR imaging of the left forefoot was performed. No intravenous contrast was administered. COMPARISON:  Radiograph 06/02/2022, MRI 05/25/2022 FINDINGS: Bones/Joint/Cartilage Prior amputation of the second digit distal phalanx. There is periarticular marrow edema in the midfoot most prominent at the second and third TMT joints related to moderate osteoarthritis, unchanged from prior. There is new mild marrow edema signal within the peripheral aspects of the second metatarsal head and base of the  second digit proximal phalanx (series 5, image 20, series 5, image 16). There is a trace second MTP joint effusion. Ligaments Intact Lisfranc ligament. Muscles and Tendons Diffuse intramuscular edema and atrophy of the foot as is commonly seen in diabetics. No acute tendon tear. There is focal tenosynovitis of the second digit extensor tendon at the level of the mid metatarsal, favored to be reactive but could potentially be infectious (series 5, image 27). Soft tissues Diffuse soft tissue swelling of the foot. There is new coalescence fluid in the first webspace extending from a plantar ulcer to the dorsal aspect, measuring 3.4 x 1.2 cm (  series 5, image 16), and 5.7 cm in length along the plantar aspect (series 9, image 13), and 2.7 cm in length at the dorsal aspect (series 8, image 7). The plantar aspect extends along the distal aspect of the plantar fascia head (series 9, image 14). The fluid also extends along the dorsal aspect of the second digit proximal phalanx extending laterally into the third webspace (series 5, images 17-16). IMPRESSION: Medial plantar forefoot ulcer, with underlying thin fluid collection extending from the plantar to dorsal aspect of the first webspace consistent with sinus tract and probable developing abscess. Fluid extends along the adjacent dorsal aspect of the second digit proximal phalanx laterally into the third webspace. The first web space fluid collection measures 3.4 x 1.2 cm in the axial plane, with thin extension 5.7 cm in length along the plantar aspect and 2.7 cm in length at the dorsal aspect. this is new from prior MRI. New mild marrow edema within the second metatarsal head and base of the second digit proximal phalanx with trace second MTP joint effusion, suspicious for early osteomyelitis and septic arthritis. Mild focal tenosynovitis of the second digit extensor tendon at the level of the mid metatarsal, favored to be reactive, infectious tenosynovitis is possible.  Diffuse soft tissue swelling of the foot, could be lymphedema or cellulitis. Electronically Signed   By: Maurine Simmering M.D.   On: 06/05/2022 08:46   US ARTERIAL ABI (SCREENING LOWER EXTREMITY)  Result Date: 06/03/2022 CLINICAL DATA:  61 year old male with a history of ulcer EXAM: NONINVASIVE PHYSIOLOGIC VASCULAR STUDY OF BILATERAL LOWER EXTREMITIES DIRECTED DUPLEX LEFT LOWER EXTREMITY ARTERIAL STUDY TECHNIQUE: Evaluation of both lower extremities was performed at rest, including calculation of ankle-brachial indices, multiple segmental pressure evaluation, segmental Doppler and segmental pulse volume recording. Directed duplex performed of the left lower extremity COMPARISON:  None Available. FINDINGS: Right ABI:  1.05 Left ABI:  0.89 Right Lower Extremity: Segmental Doppler at the right ankle demonstrates triphasic waveforms Left Lower Extremity: Segmental Doppler at the left ankle demonstrates monophasic waveforms Directed duplex of the left lower extremity demonstrates triphasic waveform of the femoropopliteal system. Triphasic anterior tibial artery. Monophasic posterior tibial artery. IMPRESSION: Right: Resting ABI within normal limits. Segmental exam performed at the right ankle demonstrates waveforms relatively maintained. Left: Resting ABI in the mild range arterial occlusive disease. Segmental and directed duplex of the left lower extremity demonstrates tibial segment arterial occlusive disease. Signed, Dulcy Fanny. Nadene Rubins, RPVI [facility] Electronically Signed   By: Corrie Mckusick D.O.   On: 06/03/2022 13:02   US ARTERIAL LOWER EXTREMITY DUPLEX LEFT (NON-ABI)  Result Date: 06/03/2022 CLINICAL DATA:  61 year old male with a history of ulcer EXAM: NONINVASIVE PHYSIOLOGIC VASCULAR STUDY OF BILATERAL LOWER EXTREMITIES DIRECTED DUPLEX LEFT LOWER EXTREMITY ARTERIAL STUDY TECHNIQUE: Evaluation of both lower extremities was performed at rest,  including calculation of ankle-brachial indices, multiple segmental pressure evaluation, segmental Doppler and segmental pulse volume recording. Directed duplex performed of the left lower extremity COMPARISON:  None Available. FINDINGS: Right ABI:  1.05 Left ABI:  0.89 Right Lower Extremity: Segmental Doppler at the right ankle demonstrates triphasic waveforms Left Lower Extremity: Segmental Doppler at the left ankle demonstrates monophasic waveforms Directed duplex of the left lower extremity demonstrates triphasic waveform of the femoropopliteal system. Triphasic anterior tibial artery. Monophasic posterior tibial artery. IMPRESSION: Right: Resting ABI within normal limits. Segmental exam performed at the right ankle demonstrates waveforms relatively maintained. Left: Resting ABI in the mild range  arterial occlusive disease. Segmental and directed duplex of the left lower extremity demonstrates tibial segment arterial occlusive disease. Signed, Dulcy Fanny. Nadene Rubins, RPVI [facility] Electronically Signed   By: Corrie Mckusick D.O.   On: 06/03/2022 13:02   DG Foot Complete Left  Result Date: 06/02/2022 CLINICAL DATA:  Infection. Wound between left first and second toes. Diabetes. EXAM: LEFT FOOT - COMPLETE 3+ VIEW COMPARISON:  Left foot radiographs 12/22/2021 FINDINGS: Redemonstration of amputation of the distal phalanx of the second toe. The resection margins remain sharp. Mild hallux valgus and mild great toe metatarsophalangeal joint space narrowing and peripheral osteophytosis. Small plantar and posterior calcaneal heel spurs. Mild dorsal talonavicular, navicular-cuneiform, and tarsometatarsal degenerative osteophytes. There is scattered lucency likely related to the reported wound just medial to the great toe proximal phalanx on frontal view. No definite cortical erosion is seen. Moderate atherosclerotic calcifications. IMPRESSION: 1. No  cortical erosion to indicate radiographic evidence of osteomyelitis. 2. Mild hallux valgus and great toe metatarsophalangeal joint osteoarthritis. Electronically Signed   By: Yvonne Kendall M.D.   On: 06/02/2022 10:16   DG Chest 2 View  Result Date: 05/27/2022 CLINICAL DATA:  Foot swelling and fever. EXAM: CHEST - 2 VIEW COMPARISON:  12/19/2021 FINDINGS: Postoperative changes in the mediastinum. Heart size and pulmonary vascularity are normal. Lungs are clear. No pleural effusions. No pneumothorax. Mediastinal contours appear intact. IMPRESSION: No active cardiopulmonary disease. Electronically Signed   By: Lucienne Capers M.D.   On: 05/27/2022 20:57   MR FOOT LEFT WO CONTRAST  Result Date: 05/25/2022 CLINICAL DATA:  Foot pain, diabetic EXAM: MRI OF THE LEFT FOOT WITHOUT CONTRAST TECHNIQUE: Multiplanar, multisequence MR imaging of the left forefoot was performed. No intravenous contrast was administered. COMPARISON:  Left foot radiograph 12/22/2021, left foot MRI 12/20/2021 FINDINGS: Bones/Joint/Cartilage Prior amputation of the second digit distal phalanx. There is periarticular marrow edema in the midfoot most prominent at the second and third tarsometatarsal joints related to moderate osteoarthritis, unchanged from prior. There is no other significant marrow signal alteration. Ligaments Intact Lisfranc ligament.  Intact MTP collateral ligaments. Muscles and Tendons Diffuse intramuscular edema and atrophy in the foot as is commonly seen in diabetics likely related to denervation change. No acute tendon tear in the forefoot. Soft tissues Diffuse soft tissue swelling of the foot. There is no focal fluid collection. No susceptibility artifact identified. IMPRESSION: No evidence of osteomyelitis or soft tissue abscess. Diffuse soft tissue swelling, as can be seen in lymphedema or cellulitis. Moderate second and third tarsometatarsal joint osteoarthritis. Electronically Signed   By: Maurine Simmering M.D.   On:  05/25/2022 15:52    Microbiology: Results for orders placed or performed during the hospital encounter of 06/02/22  Blood culture (routine x 2)     Status: None   Collection Time: 06/02/22 10:05 AM   Specimen: BLOOD  Result Value Ref Range Status   Specimen Description BLOOD BLOOD RIGHT ARM  Final   Special Requests   Final    BOTTLES DRAWN AEROBIC AND ANAEROBIC Blood Culture results may not be optimal due to an excessive volume of blood received in culture bottles   Culture   Final    NO GROWTH 5 DAYS Performed at [facility], [address], [place], [place] [address]    Report Status 06/07/2022 FINAL  Final  Aerobic/Anaerobic Culture w Gram Stain (surgical/deep wound)     Status: None   Collection Time: 06/02/22  1:35 PM  Specimen: Wound  Result Value Ref Range Status   Specimen Description   Final    WOUND Performed at [facility], [address], [place], [place] [address]    Special Requests   Final    FOOT LEFT Performed at [facility], [address] [place], [place] [address]    Gram Stain   Final    FEW WBC PRESENT,BOTH PMN AND MONONUCLEAR FEW GRAM POSITIVE COCCI IN PAIRS    Culture   Final    RARE STAPHYLOCOCCUS AUREUS NO ANAEROBES ISOLATED Performed at [facility], [place] [address] [place], [place] [address]    Report Status 06/07/2022 FINAL  Final   Organism ID, Bacteria STAPHYLOCOCCUS AUREUS  Final      Susceptibility   Staphylococcus aureus - MIC*    CIPROFLOXACIN <=0.5 SENSITIVE Sensitive     ERYTHROMYCIN <=0.25 SENSITIVE Sensitive     GENTAMICIN <=0.5 SENSITIVE Sensitive     OXACILLIN <=0.25 SENSITIVE Sensitive     TETRACYCLINE <=1 SENSITIVE Sensitive     VANCOMYCIN 1 SENSITIVE Sensitive     TRIMETH/SULFA <=10 SENSITIVE Sensitive     CLINDAMYCIN <=0.25 SENSITIVE Sensitive     RIFAMPIN <=0.5 SENSITIVE Sensitive     Inducible Clindamycin NEGATIVE Sensitive     * RARE STAPHYLOCOCCUS AUREUS  Culture, blood  (Routine X 2) w Reflex to ID Panel     Status: None   Collection Time: 06/02/22  4:36 PM   Specimen: BLOOD  Result Value Ref Range Status   Specimen Description BLOOD LEFT ANTECUBITAL  Final   Special Requests   Final    BOTTLES DRAWN AEROBIC AND ANAEROBIC Blood Culture adequate volume   Culture   Final    NO GROWTH 5 DAYS Performed at [facility], [address], [place], [place] [address]    Report Status 06/07/2022 FINAL  Final  Surgical PCR screen     Status: None   Collection Time: 06/05/22  3:27 PM   Specimen: Nasal Mucosa; Nasal Swab  Result Value Ref Range Status   MRSA, PCR NEGATIVE NEGATIVE Final   Staphylococcus aureus NEGATIVE NEGATIVE Final    Comment: (NOTE) The Xpert SA Assay (FDA approved for NASAL specimens in patients 33 years of age and older), is one component of a comprehensive surveillance program. It is not intended to diagnose infection nor to guide or monitor treatment. Performed at [facility], [address] [place], [place] [address]   Aerobic/Anaerobic Culture w Gram Stain (surgical/deep wound)     Status: None (Preliminary result)   Collection Time: 06/05/22  6:51 PM   Specimen: PATH Other; Tissue  Result Value Ref Range Status   Specimen Description   Final    FOOT Performed at [facility], [address] [place], [place] [address]    Special Requests   Final    LEFT FOOT Performed at [facility], [address] [place], [place] [address]    Gram Stain   Final    RARE WBC PRESENT,BOTH PMN AND MONONUCLEAR NO ORGANISMS SEEN    Culture   Final    NO GROWTH 2 DAYS Performed at [facility], [place] [address] [place], [place] [address]    Report Status PENDING  Incomplete  Aerobic/Anaerobic Culture w Gram Stain (surgical/deep wound)     Status: None (Preliminary result)   Collection Time: 06/05/22  7:00 PM   Specimen: PATH Other; Tissue  Result Value Ref Range Status  Specimen Description    Final    FOOT Performed at [facility], [address] [place], [place] [address]    Special Requests   Final    LEFT FOOT Performed at [facility], [place], [place] [address]    Gram Stain NO WBC SEEN NO ORGANISMS SEEN   Final   Culture   Final    NO GROWTH 3 DAYS Performed at [facility], [place] [address] [place], [place] [address]    Report Status PENDING  Incomplete    Labs: CBC: Recent Labs  Lab 06/04/22 0713 06/05/22 0532 06/06/22 0609 06/08/22 0513 06/09/22 0617 06/09/22 1110  WBC 12.6* 12.5* 13.1* 11.6* 9.4  --   HGB 11.2* 10.5* 9.8* 10.1* 9.5* 9.0*  HCT 33.2* 31.0* 29.3* 29.9* 28.8* 26.9*  MCV 86.7 87.3 88.5 86.7 87.5  --   PLT 344 343 329 320 306  --    Basic Metabolic Panel: Recent Labs  Lab 06/04/22 0713 06/05/22 0532 06/06/22 0608 06/08/22 0513 06/09/22 0617  NA 133* 134* 135 136 137  K 3.7 4.0 3.9 4.2 4.2  CL 96* 99 104 105 107  CO2 '27 27 25 25 25  '$ GLUCOSE 183* 175* 194* 208* 186*  BUN 56* 56* 45* 48* 36*  CREATININE 2.10* 1.69* 1.43* 1.24 1.18  CALCIUM 8.7* 8.9 8.6* 8.6* 8.5*   Liver Function Tests: No results for input(s): "AST", "ALT", "ALKPHOS", "BILITOT", "PROT", "ALBUMIN" in the last 168 hours. CBG: Recent Labs  Lab 06/08/22 1533 06/08/22 1806 06/08/22 2044 06/09/22 0823 06/09/22 1225  GLUCAP 137* 122* 182* 164* 239*    Discharge time spent: greater than 30 minutes.  Signed: Estill Cotta, MD [facility] 06/09/2022

## 2022-06-09 NOTE — Plan of Care (Signed)
  Problem: Education: Goal: Knowledge of General Education information will improve Description: Including pain rating scale, medication(s)/side effects and non-pharmacologic comfort measures Outcome: Progressing   Problem: Health Behavior/Discharge Planning: Goal: Ability to manage health-related needs will improve Outcome: Progressing   Problem: Clinical Measurements: Goal: Will remain free from infection Outcome: Progressing   Problem: Coping: Goal: Level of anxiety will decrease Outcome: Progressing   Problem: Nutrition: Goal: Adequate nutrition will be maintained Outcome: Progressing   Problem: Pain Managment: Goal: General experience of comfort will improve Outcome: Progressing   Problem: Safety: Goal: Ability to remain free from injury will improve Outcome: Progressing   Problem: Skin Integrity: Goal: Risk for impaired skin integrity will decrease Outcome: Progressing

## 2022-06-09 NOTE — Progress Notes (Signed)
PT Cancellation Note  Patient Details Name: Ricardo Brown MRN: [id number] DOB: [birth year]   Cancelled Treatment:    Reason Eval/Treat Not Completed: Active bedrest order until 12pm. PT will attempt as able    Claiborne Billings O'Daniel, SPT 06/09/2022, 9:21 AM

## 2022-06-09 NOTE — Progress Notes (Signed)
Daily Progress Note   Subjective  - 1 Day Post-Op  Follow-up I&D left foot.  Had angio yesterday.  Doing well.  Objective Vitals:   06/08/22 1815 06/08/22 1901 06/08/22 2340 06/09/22 0821  BP: 120/63 137/82 107/65 (!) 141/74  Pulse: 71 72 72 79  Resp: '13  18 16  '$ Temp:  97.9 F (36.6 C) 97.9 F (36.6 C) 97.7 F (36.5 C)  TempSrc:      SpO2: 97% 100% 98% 100%  Weight:      Height:        Physical Exam: Incision is coapted nicely.  Skin no drainage in the deep webspace of the left foot.  No active erythema.  No purulence.  No foul odor.  Intraoperative wound culture shows no growth.  Previous culture showed pansensitive Staph aureus     Laboratory CBC    Component Value Date/Time   WBC 9.4 06/09/2022 0617   HGB 9.0 (L) 06/09/2022 1110   HGB 13.0 08/20/2021 0632   HCT 26.9 (L) 06/09/2022 1110   HCT 38.0 08/20/2021 0632   PLT 306 06/09/2022 0617   PLT 197 08/20/2021 0632    BMET    Component Value Date/Time   NA 137 06/09/2022 0617   NA 136 08/26/2021 0952   K 4.2 06/09/2022 0617   CL 107 06/09/2022 0617   CO2 25 06/09/2022 0617   GLUCOSE 186 (H) 06/09/2022 0617   BUN 36 (H) 06/09/2022 0617   BUN 69 (H) 08/26/2021 0952   CREATININE 1.18 06/09/2022 0617   CREATININE 1.31 09/30/2020 1114   CALCIUM 8.5 (L) 06/09/2022 0617   GFRNONAA >60 06/09/2022 0617   GFRNONAA 59 (L) 09/30/2020 1114   GFRAA 69 09/30/2020 1114    Assessment/Planning: Status post I&D left first webspace  This point new dressing applied.  Keep dressing clean and dry but if becomes soiled can change as needed with dry gauze.  Follow-up with Dr. Luana Shu in 1 week. Recommend oral antibiotics for now.  Culture taken in office 2 to 3 weeks ago grew MRSA.  Current wound culture shows pansensitive Staph aureus.  Current bone culture is negative for growth. Maintain wedge shoe while ambulatory.  Avoid forefoot weightbearing.  From podiatry standpoint okay to discharge.  Samara Deist  A  06/09/2022, 1:46 PM

## 2022-06-10 ENCOUNTER — Telehealth: Payer: Self-pay

## 2022-06-10 NOTE — Telephone Encounter (Signed)
Transition Care Management Unsuccessful Follow-up Telephone Call  Date of discharge and from where:  [place] 06/09/2022  Attempts:  1st Attempt  Reason for unsuccessful TCM follow-up call:  Left voice message Juanda Crumble, [place] Direct Dial [phone]

## 2022-06-11 LAB — AEROBIC/ANAEROBIC CULTURE W GRAM STAIN (SURGICAL/DEEP WOUND)
Culture: NO GROWTH
Culture: NORMAL
Gram Stain: NONE SEEN

## 2022-06-12 NOTE — Telephone Encounter (Signed)
Transition Care Management Unsuccessful Follow-up Telephone Call  Date of discharge and from where:  [place] 06/11/2022  Attempts:  2nd Attempt  Reason for unsuccessful TCM follow-up call:  Left voice message Juanda Crumble, [place] Direct Dial [phone]

## 2022-06-15 ENCOUNTER — Ambulatory Visit: Payer: 59 | Admitting: Internal Medicine

## 2022-06-17 NOTE — Progress Notes (Signed)
Established Patient Office Visit  Subjective:  Patient ID: Ricardo Brown, male    DOB: 02-21-1961  Age: 61 y.o. MRN: [id number]  CC:  Chief Complaint  Patient presents with   Back Pain    Pulled muscle in back a couple days ago and it moved into neck and into chest. He can hardly move his neck. Follow up from foot operation. His foot is doing good, podiatrist please with the healing.      HPI  Ricardo Brown presents for routine follow-up.  He has history of CKD,PVD, anemia diabetes, hypertension and CHF.  He is followed by endocrinology and nephrology.  He had  cellulitis and diabetic foot infection.  Was discharged on antibiotic and had follow-up with him podiatry yesterday.  He complains of neck stiffness neck that started all as back pain and then slowly progress to neck stiffness last night.   Neck Pain  This is a new problem. The current episode started today. The problem occurs constantly. The problem has been unchanged. Associated with: woke up with neck pain and stiffness this morning.Intially back started hurting 2 days ago then moved to the neck and and right front chest. The pain is present in the anterior neck. The quality of the pain is described as aching. The pain is at a severity of 9/10. The pain is severe. Exacerbated by: movement. Pertinent negatives include no chest pain, headaches, numbness or tingling. He has tried muscle relaxants and ice for the symptoms. The treatment provided mild relief.     Past Medical History:  Diagnosis Date   Chronic combined systolic (congestive) and diastolic (congestive) heart failure ([place])    a. 01/2018 EF 35-40%; b. 10/2020 Echo: EF 40-45%, glob HK. RVSP 42.57mHg. Mildly dil LA. Mild MS/AoV sclerosis.   CKD (chronic kidney disease), stage Brown (HCC)    Coronary artery disease    a. 01/2018 late presenting inferior MI; b. 01/2018 Cath: Severe multivessel dzs-->CABG x 3 (LIMA->LAD, VG->OM2, VG->RPDA @ [facility]).    Diabetes mellitus without complication ([facility]    a. Dx ~ 2000.   Heart palpitations    a. Pt reports prior nl echo's and stress tests. Last stress test w/in past 2 yrs - PCP.   High cholesterol    Hypertension    Mitral regurgitation    a. 01/2018 s/p MV repair @ time of CABG.   Myocardial infarction ([facility]    Postoperative atrial fibrillation    a. 01/2018 @ time of CABG.   PSVT (paroxysmal supraventricular tachycardia)    a. Very symptomatic with multiple ED evaluations.  Has been on amio.   Severe sepsis with acute organ dysfunction ([facility] 12/19/2021   Sleep difficulties 09/07/2019   [place]I ([place] elevation myocardial infarction) [place] 01/24/2018    Past Surgical History:  Procedure Laterality Date   AMPUTATION TOE Right 08/29/2021   Procedure: AMPUTATION TOE-2nd toe;  Surgeon: CSharlotte Alamo DPM;  Location: [facility] ORS;  Service: Podiatry;  Laterality: Right;   AMPUTATION TOE Left 12/22/2021   Procedure: AMPUTATION TOE-2nd Toe Partial;  Surgeon: BCaroline More DPM;  Location: [facility] ORS;  Service: Podiatry;  Laterality: Left;   CARDIAC CATHETERIZATION     CARDIAC VALVE REPLACEMENT     Mitral Valve Repair   COLONOSCOPY WITH PROPOFOL N/A 06/07/2019   Procedure: COLONOSCOPY WITH PROPOFOL;  Surgeon: TVirgel Manifold MD;  Location: [facility] ENDOSCOPY;  Service: Endoscopy;  Laterality: N/A;   COLONOSCOPY WITH PROPOFOL N/A 02/07/2020   Procedure: COLONOSCOPY WITH PROPOFOL;  Surgeon: [name], MD;  Location: [facility] ENDOSCOPY;  Service: Endoscopy;  Laterality: N/A;   COLONOSCOPY WITH PROPOFOL N/A 12/11/2020   Procedure: COLONOSCOPY WITH PROPOFOL;  Surgeon: [name], MD;  Location: [facility] ENDOSCOPY;  Service: Endoscopy;  Laterality: N/A;   CORONARY ANGIOPLASTY     CORONARY ARTERY BYPASS GRAFT     x 3   01/2018   ESOPHAGOGASTRODUODENOSCOPY N/A 02/07/2020   Procedure: ESOPHAGOGASTRODUODENOSCOPY (EGD);  Surgeon: [name], MD;  Location: [facility] ENDOSCOPY;  Service: Endoscopy;   Laterality: N/A;   INCISION AND DRAINAGE Left 06/05/2022   Procedure: INCISION AND DRAINAGE;  Surgeon: Caroline More, DPM;  Location: [facility] ORS;  Service: Podiatry;  Laterality: Left;   LEFT HEART CATH AND CORONARY ANGIOGRAPHY N/A 01/24/2018   Procedure: LEFT HEART CATH AND CORONARY ANGIOGRAPHY;  Surgeon: [name], MD;  Location: [place] CV LAB;  Service: Cardiovascular;  Laterality: N/A;   LOWER EXTREMITY ANGIOGRAPHY Left 06/08/2022   Procedure: Lower Extremity Angiography;  Surgeon: Algernon Huxley, MD;  Location: [place] CV LAB;  Service: Cardiovascular;  Laterality: Left;   SVT ABLATION N/A 09/12/2021   Procedure: SVT ABLATION;  Surgeon: Vickie Epley, MD;  Location: [place] CV LAB;  Service: Cardiovascular;  Laterality: N/A;   TONSILLECTOMY      Family History  Problem Relation Age of Onset   Cancer Mother        died @ 39   CAD Father        First MI @ 38. S/p heart transplant. Died in mid-50's of cancer.   Cancer Father    Other Brother        alive and well    Social History   Socioeconomic History   Marital status: Married    Spouse name: Not on file   Number of children: Not on file   Years of education: Not on file   Highest education level: Not on file  Occupational History   Not on file  Tobacco Use   Smoking status: Never   Smokeless tobacco: Never  Vaping Use   Vaping Use: Never used  Substance and Sexual Activity   Alcohol use: Not Currently    Comment: rare beer   Drug use: No   Sexual activity: Not on file  Other Topics Concern   Not on file  Social History Narrative   Lives locally with wife.  Works in [employer].  Does not routinely exercise.   Social Determinants of Health   Financial Resource Strain: Not on file  Food Insecurity: Food Insecurity Present (06/02/2022)   Hunger Vital Sign    Worried About Running Out of Food in the Last Year: Sometimes true    Ran Out of Food in the Last Year: Sometimes true   Transportation Needs: No Transportation Needs (06/02/2022)   PRAPARE - Hydrologist (Medical): No    Lack of Transportation (Non-Medical): No  Physical Activity: Not on file  Stress: Not on file  Social Connections: Not on file  Intimate Partner Violence: Not At Risk (06/02/2022)   Humiliation, Afraid, Rape, and Kick questionnaire    Fear of Current or Ex-Partner: No    Emotionally Abused: No    Physically Abused: No    Sexually Abused: No     Outpatient Medications Prior to Visit  Medication Sig Dispense Refill   amiodarone (PACERONE) 200 MG tablet Take 200 mg by mouth daily.     ascorbic acid (VITAMIN  C) 500 MG tablet Take 1 tablet (500 mg total) by mouth 2 (two) times daily. 60 tablet 3   aspirin 81 MG EC tablet Take 81 mg by mouth daily.      b complex vitamins tablet Take 1 tablet by mouth daily.      clopidogrel (PLAVIX) 75 MG tablet Take 1 tablet (75 mg total) by mouth daily. 30 tablet 3   doxycycline (ADOXA) 100 MG tablet Take 1 tablet (100 mg total) by mouth 2 (two) times daily for 14 days. 28 tablet 0   Dulaglutide 1.5 MG/0.5ML SOPN Inject 1.5 mg into the skin every Saturday.     empagliflozin (JARDIANCE) 25 MG TABS tablet Take 25 mg by mouth daily.     gabapentin (NEURONTIN) 100 MG capsule Take 1 capsule (100 mg total) by mouth 2 (two) times daily. 60 capsule 6   insulin degludec (TRESIBA) 200 UNIT/ML FlexTouch Pen 32 Units at bedtime.     losartan (COZAAR) 25 MG tablet Take 1 tablet (25 mg total) by mouth daily. HOLD UNTIL FOLLOW-UP WITH YOUR DOCTOR     metFORMIN (GLUCOPHAGE) 500 MG tablet Take 2 tablets (1,000 mg total) by mouth 2 (two) times daily. 360 tablet 2   metolazone (ZAROXOLYN) 2.5 MG tablet Take 1 tablet (2.5 mg total) by mouth daily. HOLD UNTIL FOLLOW-UP WITH YOUR DOCTOR 30 tablet 4   metoprolol succinate (TOPROL-XL) 50 MG 24 hr tablet Take 1 tablet (50 mg total) by mouth daily. Take with or immediately following a meal. 90 tablet 3    Omega-3 Fatty Acids (FISH OIL) 875 MG CAPS Take 875 mg by mouth 2 (two) times daily.     pramipexole (MIRAPEX) 1.5 MG tablet Take 1 tablet (1.5 mg total) by mouth 2 (two) times daily. 180 tablet 3   rosuvastatin (CRESTOR) 20 MG tablet Take 1 tablet (20 mg total) by mouth daily. 90 tablet 0   torsemide (DEMADEX) 20 MG tablet Take 2 tablets (40 mg total) by mouth daily. 40 mg daily 180 tablet 3   No facility-administered medications prior to visit.    Allergies  Allergen Reactions   Lipitor [Atorvastatin] Palpitations    SVT   Sacubitril-Valsartan Other (See Comments)    SVT Other reaction(s): Other (see comments) Other reaction(s): Other (See Comments) SVT SVT   Pregabalin Palpitations    SVT Other reaction(s): Other (see comments) SVT SVT    ROS Review of Systems  Constitutional: Negative.   HENT: Negative.    Eyes: Negative.   Respiratory:  Negative for shortness of breath.   Cardiovascular:  Negative for chest pain and palpitations.  Gastrointestinal: Negative.   Genitourinary: Negative.   Musculoskeletal:  Positive for neck pain.  Skin: Negative.   Neurological:  Negative for tingling, numbness and headaches.  Psychiatric/Behavioral:  Negative for agitation, behavioral problems and confusion.       Objective:    Physical Exam Constitutional:      Appearance: He is obese.  HENT:     Head: Normocephalic.     Right Ear: Tympanic membrane normal.     Left Ear: Tympanic membrane normal.  Eyes:     Conjunctiva/sclera: Conjunctivae normal.     Pupils: Pupils are equal, round, and reactive to light.  Cardiovascular:     Rate and Rhythm: Normal rate and regular rhythm.     Pulses: Normal pulses.     Heart sounds: Normal heart sounds.  Pulmonary:     Effort: Pulmonary effort is normal.  Breath sounds: Normal breath sounds.  Musculoskeletal:     Cervical back: Rigidity present.  Skin:    General: Skin is warm.  Neurological:     General: No focal deficit  present.     Mental Status: He is alert and oriented to person, place, and time. Mental status is at baseline.  Psychiatric:        Mood and Affect: Mood normal.        Behavior: Behavior normal.        Thought Content: Thought content normal.        Judgment: Judgment normal.     BP 137/65   Pulse 79   Temp (!) 97 F (36.1 C) (Temporal)   Ht _0  (1.727 m)   Wt 208 lb 3.2 oz (94.4 kg)   SpO2 94%   BMI 31.66 kg/m  Wt Readings from Last 3 Encounters:  06/18/22 208 lb 3.2 oz (94.4 kg)  06/02/22 210 lb 8.6 oz (95.5 kg)  05/30/22 210 lb 8.6 oz (95.5 kg)     Health Maintenance  Topic Date Due   Medicare Annual Wellness (AWV)  Never done   Hepatitis C Screening  Never done   Zoster Vaccines- Shingrix (1 of 2) 07/13/2022 (Originally 07/24/1980)   OPHTHALMOLOGY EXAM  09/18/2022 (Originally 10/01/2021)   COLONOSCOPY (Pts 45-59yr Insurance coverage will need to be confirmed)  12/23/2022 (Originally 12/11/2021)   HEMOGLOBIN A1C  11/26/2022   FOOT EXAM  03/31/2023   Diabetic kidney evaluation - Urine ACR  04/08/2023   Diabetic kidney evaluation - GFR measurement  06/10/2023   HIV Screening  Completed   HPV VACCINES  Aged Out   INFLUENZA VACCINE  Discontinued   COVID-19 Vaccine  Discontinued    There are no preventive care reminders to display for this patient.  Lab Results  Component Value Date   TSH 3.030 05/27/2022   Lab Results  Component Value Date   WBC 9.4 06/09/2022   HGB 9.0 (L) 06/09/2022   HCT 26.9 (L) 06/09/2022   MCV 87.5 06/09/2022   PLT 306 06/09/2022   Lab Results  Component Value Date   NA 137 06/09/2022   K 4.2 06/09/2022   CO2 25 06/09/2022   GLUCOSE 186 (H) 06/09/2022   BUN 36 (H) 06/09/2022   CREATININE 1.18 06/09/2022   BILITOT 0.6 06/02/2022   ALKPHOS 63 06/02/2022   AST 78 (H) 06/02/2022   ALT 73 (H) 06/02/2022   PROT 7.7 06/02/2022   ALBUMIN 3.2 (L) 06/02/2022   CALCIUM 8.5 (L) 06/09/2022   ANIONGAP 5 06/09/2022   EGFR 29 (L)  08/26/2021   Lab Results  Component Value Date   CHOL 122 09/30/2020   Lab Results  Component Value Date   HDL 35 (L) 09/30/2020   Lab Results  Component Value Date   LDLCALC 67 09/30/2020   Lab Results  Component Value Date   TRIG 121 09/30/2020   Lab Results  Component Value Date   CHOLHDL 3.5 09/30/2020   Lab Results  Component Value Date   HGBA1C 6.9 (H) 05/27/2022      Assessment & Plan:   Problem List Items Addressed This Visit       Cardiovascular and Mediastinum   Hypertension - Primary    Patient BP  Vitals:   06/18/22 0850  BP: 137/65    in the office 06/22/22  Advised pt to follow a low sodium and heart healthy diet. Encouraged him to follow regular exercise routine.  Endocrine   Type 2 diabetes mellitus with diabetic chronic kidney disease ([place])    Encourage patient to check blood sugar at home and bring the readings to next visit His last hemoglobin A1c 6.9 on 05/27/2022. Continue the current medication.        Other   Neck stiffness    Started him on Flexeril and meloxicam. We will encourage him to alternate hot and cold pack        Meds ordered this encounter  Medications   cyclobenzaprine (FLEXERIL) 5 MG tablet    Sig: Take 1 tablet (5 mg total) by mouth 3 (three) times daily as needed for muscle spasms.    Dispense:  30 tablet    Refill:  1   meloxicam (MOBIC) 7.5 MG tablet    Sig: Take 1 tablet (7.5 mg total) by mouth daily.    Dispense:  20 tablet    Refill:  0     Follow-up: No follow-ups on file.    Theresia Lo, NP

## 2022-06-18 ENCOUNTER — Encounter: Payer: Self-pay | Admitting: Nurse Practitioner

## 2022-06-18 ENCOUNTER — Ambulatory Visit (INDEPENDENT_AMBULATORY_CARE_PROVIDER_SITE_OTHER): Payer: Medicare Other | Admitting: Nurse Practitioner

## 2022-06-18 VITALS — BP 137/65 | HR 79 | Temp 97.0°F | Ht 68.0 in | Wt 208.2 lb

## 2022-06-18 DIAGNOSIS — I10 Essential (primary) hypertension: Secondary | ICD-10-CM

## 2022-06-18 DIAGNOSIS — E1122 Type 2 diabetes mellitus with diabetic chronic kidney disease: Secondary | ICD-10-CM | POA: Diagnosis not present

## 2022-06-18 DIAGNOSIS — E7800 Pure hypercholesterolemia, unspecified: Secondary | ICD-10-CM

## 2022-06-18 DIAGNOSIS — Z794 Long term (current) use of insulin: Secondary | ICD-10-CM

## 2022-06-18 DIAGNOSIS — M436 Torticollis: Secondary | ICD-10-CM

## 2022-06-18 DIAGNOSIS — N1832 Chronic kidney disease, stage 3b: Secondary | ICD-10-CM

## 2022-06-18 MED ORDER — MELOXICAM 7.5 MG PO TABS: 7.5000 mg | ORAL_TABLET | Freq: Every day | ORAL | 0 refills | Status: DC

## 2022-06-18 MED ORDER — CYCLOBENZAPRINE HCL 5 MG PO TABS: 5.0000 mg | ORAL_TABLET | Freq: Three times a day (TID) | ORAL | 1 refills | Status: DC | PRN

## 2022-06-22 ENCOUNTER — Telehealth: Payer: Self-pay | Admitting: Cardiology

## 2022-06-22 ENCOUNTER — Encounter: Payer: Self-pay | Admitting: Nurse Practitioner

## 2022-06-22 DIAGNOSIS — M436 Torticollis: Secondary | ICD-10-CM | POA: Insufficient documentation

## 2022-06-22 DIAGNOSIS — I5020 Unspecified systolic (congestive) heart failure: Secondary | ICD-10-CM

## 2022-06-22 MED ORDER — TORSEMIDE 20 MG PO TABS: 40.0000 mg | ORAL_TABLET | Freq: Every day | ORAL | 0 refills | Status: DC

## 2022-06-22 NOTE — Assessment & Plan Note (Signed)
Patient BP  Vitals:   06/18/22 0850  BP: 137/65    in the office 06/22/22  Advised pt to follow a low sodium and heart healthy diet. Encouraged him to follow regular exercise routine.

## 2022-06-22 NOTE — Telephone Encounter (Signed)
*  STAT* If patient is at the pharmacy, call can be transferred to refill team.   1. Which medications need to be refilled? (please list name of each medication and dose if known)   torsemide (DEMADEX) 20 MG tablet    2. Which pharmacy/location (including street and city if local pharmacy) is medication to be sent to? [place] [place]   3. Do they need a 30 day or 90 day supply? [place]

## 2022-06-22 NOTE — Assessment & Plan Note (Signed)
Encourage patient to check blood sugar at home and bring the readings to next visit His last hemoglobin A1c 6.9 on 05/27/2022. Continue the current medication.

## 2022-06-22 NOTE — Telephone Encounter (Signed)
Requested Prescriptions   Signed Prescriptions Disp Refills   torsemide (DEMADEX) 20 MG tablet 180 tablet 0    Sig: Take 2 tablets (40 mg total) by mouth daily. 40 mg daily    Authorizing Provider: [name]    Ordering User: Raelene Bott, Maliik Karner L  \

## 2022-06-22 NOTE — Assessment & Plan Note (Signed)
Started him on Flexeril and meloxicam. We will encourage him to alternate hot and cold pack

## 2022-06-28 ENCOUNTER — Other Ambulatory Visit: Payer: Self-pay | Admitting: Internal Medicine

## 2022-06-28 DIAGNOSIS — I25810 Atherosclerosis of coronary artery bypass graft(s) without angina pectoris: Secondary | ICD-10-CM

## 2022-06-28 DIAGNOSIS — Z794 Long term (current) use of insulin: Secondary | ICD-10-CM

## 2022-07-14 LAB — HM DIABETES EYE EXAM

## 2022-07-17 ENCOUNTER — Encounter: Payer: Self-pay | Admitting: Internal Medicine

## 2022-07-23 ENCOUNTER — Other Ambulatory Visit: Payer: Self-pay

## 2022-07-23 ENCOUNTER — Telehealth: Payer: Self-pay | Admitting: Cardiology

## 2022-07-23 MED ORDER — ROSUVASTATIN CALCIUM 20 MG PO TABS: 20.0000 mg | ORAL_TABLET | Freq: Every day | ORAL | 0 refills | Status: DC

## 2022-07-23 NOTE — Telephone Encounter (Signed)
Disp Refills Start End   rosuvastatin (CRESTOR) 20 MG tablet 90 tablet 0 07/23/2022 10/21/2022   Sig - Route: Take 1 tablet (20 mg total) by mouth daily. - Oral    Pharmacy  [facility], [place]

## 2022-07-23 NOTE — Telephone Encounter (Signed)
*  STAT* If patient is at the pharmacy, call can be transferred to refill team.   1. Which medications need to be refilled? (please list name of each medication and dose if known) rosuvastatin (CRESTOR) 20 MG tablet   2. Which pharmacy/location (including street and city if local pharmacy) is medication to be sent to? [place], [place]   3. Do they need a 30 day or 90 day supply? 90 day   Patient is completely out of medication.

## 2022-07-30 ENCOUNTER — Inpatient Hospital Stay
Admission: EM | Admit: 2022-07-30 | Discharge: 2022-08-11 | DRG: 617 | Disposition: A | Discharge status: Home Health | Payer: Medicare Other | Attending: Internal Medicine | Admitting: Internal Medicine

## 2022-07-30 ENCOUNTER — Emergency Department: Payer: Medicare Other

## 2022-07-30 ENCOUNTER — Other Ambulatory Visit: Payer: Self-pay

## 2022-07-30 DIAGNOSIS — Z8249 Family history of ischemic heart disease and other diseases of the circulatory system: Secondary | ICD-10-CM | POA: Diagnosis not present

## 2022-07-30 DIAGNOSIS — M869 Osteomyelitis, unspecified: Secondary | ICD-10-CM | POA: Diagnosis present

## 2022-07-30 DIAGNOSIS — L089 Local infection of the skin and subcutaneous tissue, unspecified: Secondary | ICD-10-CM

## 2022-07-30 DIAGNOSIS — E669 Obesity, unspecified: Secondary | ICD-10-CM | POA: Diagnosis present

## 2022-07-30 DIAGNOSIS — N1831 Chronic kidney disease, stage 3a: Secondary | ICD-10-CM | POA: Diagnosis present

## 2022-07-30 DIAGNOSIS — Z7984 Long term (current) use of oral hypoglycemic drugs: Secondary | ICD-10-CM

## 2022-07-30 DIAGNOSIS — Z951 Presence of aortocoronary bypass graft: Secondary | ICD-10-CM | POA: Diagnosis not present

## 2022-07-30 DIAGNOSIS — Z89422 Acquired absence of other left toe(s): Secondary | ICD-10-CM | POA: Diagnosis not present

## 2022-07-30 DIAGNOSIS — E1151 Type 2 diabetes mellitus with diabetic peripheral angiopathy without gangrene: Secondary | ICD-10-CM | POA: Diagnosis present

## 2022-07-30 DIAGNOSIS — Z888 Allergy status to other drugs, medicaments and biological substances status: Secondary | ICD-10-CM

## 2022-07-30 DIAGNOSIS — M8618 Other acute osteomyelitis, other site: Secondary | ICD-10-CM | POA: Diagnosis not present

## 2022-07-30 DIAGNOSIS — E7800 Pure hypercholesterolemia, unspecified: Secondary | ICD-10-CM | POA: Diagnosis present

## 2022-07-30 DIAGNOSIS — I4510 Unspecified right bundle-branch block: Secondary | ICD-10-CM | POA: Diagnosis present

## 2022-07-30 DIAGNOSIS — Z952 Presence of prosthetic heart valve: Secondary | ICD-10-CM | POA: Diagnosis not present

## 2022-07-30 DIAGNOSIS — E11628 Type 2 diabetes mellitus with other skin complications: Secondary | ICD-10-CM

## 2022-07-30 DIAGNOSIS — Z89421 Acquired absence of other right toe(s): Secondary | ICD-10-CM | POA: Diagnosis not present

## 2022-07-30 DIAGNOSIS — M86172 Other acute osteomyelitis, left ankle and foot: Secondary | ICD-10-CM | POA: Diagnosis not present

## 2022-07-30 DIAGNOSIS — N179 Acute kidney failure, unspecified: Secondary | ICD-10-CM | POA: Diagnosis present

## 2022-07-30 DIAGNOSIS — N182 Chronic kidney disease, stage 2 (mild): Secondary | ICD-10-CM | POA: Diagnosis not present

## 2022-07-30 DIAGNOSIS — I130 Hypertensive heart and chronic kidney disease with heart failure and stage 1 through stage 4 chronic kidney disease, or unspecified chronic kidney disease: Secondary | ICD-10-CM | POA: Diagnosis present

## 2022-07-30 DIAGNOSIS — B957 Other staphylococcus as the cause of diseases classified elsewhere: Secondary | ICD-10-CM | POA: Diagnosis present

## 2022-07-30 DIAGNOSIS — D62 Acute posthemorrhagic anemia: Secondary | ICD-10-CM | POA: Diagnosis not present

## 2022-07-30 DIAGNOSIS — E1122 Type 2 diabetes mellitus with diabetic chronic kidney disease: Secondary | ICD-10-CM | POA: Diagnosis not present

## 2022-07-30 DIAGNOSIS — Z8619 Personal history of other infectious and parasitic diseases: Secondary | ICD-10-CM

## 2022-07-30 DIAGNOSIS — Z9861 Coronary angioplasty status: Secondary | ICD-10-CM | POA: Diagnosis not present

## 2022-07-30 DIAGNOSIS — I252 Old myocardial infarction: Secondary | ICD-10-CM

## 2022-07-30 DIAGNOSIS — E1169 Type 2 diabetes mellitus with other specified complication: Secondary | ICD-10-CM | POA: Diagnosis not present

## 2022-07-30 DIAGNOSIS — I2510 Atherosclerotic heart disease of native coronary artery without angina pectoris: Secondary | ICD-10-CM | POA: Diagnosis present

## 2022-07-30 DIAGNOSIS — Z7985 Long-term (current) use of injectable non-insulin antidiabetic drugs: Secondary | ICD-10-CM

## 2022-07-30 DIAGNOSIS — Z7982 Long term (current) use of aspirin: Secondary | ICD-10-CM

## 2022-07-30 DIAGNOSIS — M009 Pyogenic arthritis, unspecified: Secondary | ICD-10-CM | POA: Diagnosis present

## 2022-07-30 DIAGNOSIS — Z79899 Other long term (current) drug therapy: Secondary | ICD-10-CM

## 2022-07-30 DIAGNOSIS — E1140 Type 2 diabetes mellitus with diabetic neuropathy, unspecified: Secondary | ICD-10-CM | POA: Diagnosis present

## 2022-07-30 DIAGNOSIS — I5042 Chronic combined systolic (congestive) and diastolic (congestive) heart failure: Secondary | ICD-10-CM | POA: Diagnosis not present

## 2022-07-30 DIAGNOSIS — Z794 Long term (current) use of insulin: Secondary | ICD-10-CM

## 2022-07-30 DIAGNOSIS — Z791 Long term (current) use of non-steroidal anti-inflammatories (NSAID): Secondary | ICD-10-CM

## 2022-07-30 DIAGNOSIS — Z6831 Body mass index (BMI) 31.0-31.9, adult: Secondary | ICD-10-CM

## 2022-07-30 DIAGNOSIS — Z7902 Long term (current) use of antithrombotics/antiplatelets: Secondary | ICD-10-CM

## 2022-07-30 DIAGNOSIS — I7025 Atherosclerosis of native arteries of other extremities with ulceration: Secondary | ICD-10-CM | POA: Diagnosis present

## 2022-07-30 DIAGNOSIS — I10 Essential (primary) hypertension: Secondary | ICD-10-CM | POA: Diagnosis present

## 2022-07-30 DIAGNOSIS — I739 Peripheral vascular disease, unspecified: Secondary | ICD-10-CM | POA: Diagnosis not present

## 2022-07-30 DIAGNOSIS — N1832 Chronic kidney disease, stage 3b: Secondary | ICD-10-CM | POA: Diagnosis not present

## 2022-07-30 LAB — CBC WITH DIFFERENTIAL/PLATELET
Abs Immature Granulocytes: 0.06 10*3/uL (ref 0.00–0.07)
Basophils Absolute: 0.1 10*3/uL (ref 0.0–0.1)
Basophils Relative: 1 %
Eosinophils Absolute: 0.2 10*3/uL (ref 0.0–0.5)
Eosinophils Relative: 2 %
HCT: 37.9 % — ABNORMAL LOW (ref 39.0–52.0)
Hemoglobin: 12 g/dL — ABNORMAL LOW (ref 13.0–17.0)
Immature Granulocytes: 1 %
Lymphocytes Relative: 15 %
Lymphs Abs: 1.4 10*3/uL (ref 0.7–4.0)
MCH: 28.4 pg (ref 26.0–34.0)
MCHC: 31.7 g/dL (ref 30.0–36.0)
MCV: 89.6 fL (ref 80.0–100.0)
Monocytes Absolute: 0.7 10*3/uL (ref 0.1–1.0)
Monocytes Relative: 7 %
Neutro Abs: 7 10*3/uL (ref 1.7–7.7)
Neutrophils Relative %: 74 %
Platelets: 302 10*3/uL (ref 150–400)
RBC: 4.23 MIL/uL (ref 4.22–5.81)
RDW: 13.8 % (ref 11.5–15.5)
WBC: 9.4 10*3/uL (ref 4.0–10.5)
nRBC: 0 % (ref 0.0–0.2)

## 2022-07-30 LAB — PREALBUMIN: Prealbumin: 20 mg/dL (ref 18–38)

## 2022-07-30 LAB — COMPREHENSIVE METABOLIC PANEL
ALT: 15 U/L (ref 0–44)
AST: 16 U/L (ref 15–41)
Albumin: 4.1 g/dL (ref 3.5–5.0)
Alkaline Phosphatase: 61 U/L (ref 38–126)
Anion gap: 15 (ref 5–15)
BUN: 41 mg/dL — ABNORMAL HIGH (ref 8–23)
CO2: 21 mmol/L — ABNORMAL LOW (ref 22–32)
Calcium: 9.2 mg/dL (ref 8.9–10.3)
Chloride: 100 mmol/L (ref 98–111)
Creatinine, Ser: 1.57 mg/dL — ABNORMAL HIGH (ref 0.61–1.24)
GFR, Estimated: 50 mL/min — ABNORMAL LOW (ref >60)
Glucose, Bld: 193 mg/dL — ABNORMAL HIGH (ref 70–99)
Potassium: 4.3 mmol/L (ref 3.5–5.1)
Sodium: 136 mmol/L (ref 135–145)
Total Bilirubin: 0.6 mg/dL (ref 0.3–1.2)
Total Protein: 8.5 g/dL — ABNORMAL HIGH (ref 6.5–8.1)

## 2022-07-30 LAB — LACTIC ACID, PLASMA: Lactic Acid, Venous: 2.1 mmol/L (ref 0.5–1.9)

## 2022-07-30 LAB — SEDIMENTATION RATE: Sed Rate: 77 mm/hr — ABNORMAL HIGH (ref 0–20)

## 2022-07-30 LAB — CBG MONITORING, ED: Glucose-Capillary: 111 mg/dL — ABNORMAL HIGH (ref 70–99)

## 2022-07-30 LAB — C-REACTIVE PROTEIN: CRP: 7.1 mg/dL — ABNORMAL HIGH (ref <1.0)

## 2022-07-30 MED ORDER — PIPERACILLIN-TAZOBACTAM 3.375 G IVPB 30 MIN
3.3750 g | Freq: Once | INTRAVENOUS | Status: AC
  Administered 2022-07-30: 3.375 g via INTRAVENOUS
  Filled 2022-07-30: qty 50

## 2022-07-30 MED ORDER — LACTATED RINGERS IV BOLUS
1000.0000 mL | Freq: Once | INTRAVENOUS | Status: AC
  Administered 2022-07-30: 1000 mL via INTRAVENOUS

## 2022-07-30 MED ORDER — METOPROLOL SUCCINATE ER 50 MG PO TB24
50.0000 mg | ORAL_TABLET | Freq: Every day | ORAL | Status: DC
  Administered 2022-07-31 – 2022-08-11 (×12): 50 mg via ORAL
  Filled 2022-07-30 (×12): qty 1

## 2022-07-30 MED ORDER — CLOPIDOGREL BISULFATE 75 MG PO TABS
75.0000 mg | ORAL_TABLET | Freq: Every day | ORAL | Status: DC
  Administered 2022-07-31: 75 mg via ORAL
  Filled 2022-07-30 (×2): qty 1

## 2022-07-30 MED ORDER — INSULIN ASPART 100 UNIT/ML IJ SOLN
0.0000 [IU] | Freq: Every day | INTRAMUSCULAR | Status: DC
  Administered 2022-08-04 – 2022-08-07 (×3): 2 [IU] via SUBCUTANEOUS
  Filled 2022-07-30 (×3): qty 1

## 2022-07-30 MED ORDER — MORPHINE SULFATE (PF) 2 MG/ML IV SOLN
2.0000 mg | INTRAVENOUS | Status: DC | PRN
  Administered 2022-08-05 (×2): 2 mg via INTRAVENOUS
  Filled 2022-07-30 (×2): qty 1

## 2022-07-30 MED ORDER — ONDANSETRON HCL 4 MG/2ML IJ SOLN: 4.0000 mg | Freq: Four times a day (QID) | INTRAMUSCULAR | Status: DC | PRN

## 2022-07-30 MED ORDER — ACETAMINOPHEN 325 MG PO TABS: 650.0000 mg | ORAL_TABLET | Freq: Four times a day (QID) | ORAL | Status: DC | PRN

## 2022-07-30 MED ORDER — ACETAMINOPHEN 650 MG RE SUPP: 650.0000 mg | Freq: Four times a day (QID) | RECTAL | Status: DC | PRN

## 2022-07-30 MED ORDER — LOSARTAN POTASSIUM 25 MG PO TABS
25.0000 mg | ORAL_TABLET | Freq: Every day | ORAL | Status: DC
  Administered 2022-07-31 – 2022-08-08 (×7): 25 mg via ORAL
  Filled 2022-07-30 (×9): qty 1

## 2022-07-30 MED ORDER — GABAPENTIN 100 MG PO CAPS
100.0000 mg | ORAL_CAPSULE | Freq: Two times a day (BID) | ORAL | Status: DC
  Administered 2022-07-30 – 2022-08-11 (×24): 100 mg via ORAL
  Filled 2022-07-30 (×24): qty 1

## 2022-07-30 MED ORDER — VANCOMYCIN HCL IN DEXTROSE 1-5 GM/200ML-% IV SOLN
1000.0000 mg | Freq: Once | INTRAVENOUS | Status: AC
  Administered 2022-07-30: 1000 mg via INTRAVENOUS
  Filled 2022-07-30: qty 200

## 2022-07-30 MED ORDER — ONDANSETRON HCL 4 MG PO TABS: 4.0000 mg | ORAL_TABLET | Freq: Four times a day (QID) | ORAL | Status: DC | PRN

## 2022-07-30 MED ORDER — ROSUVASTATIN CALCIUM 10 MG PO TABS
20.0000 mg | ORAL_TABLET | Freq: Every day | ORAL | Status: DC
  Administered 2022-07-31 – 2022-08-11 (×12): 20 mg via ORAL
  Filled 2022-07-30 (×6): qty 2
  Filled 2022-07-30: qty 1
  Filled 2022-07-30 (×6): qty 2

## 2022-07-30 MED ORDER — INSULIN ASPART 100 UNIT/ML IJ SOLN
0.0000 [IU] | Freq: Three times a day (TID) | INTRAMUSCULAR | Status: DC
  Administered 2022-08-01: 3 [IU] via SUBCUTANEOUS
  Administered 2022-08-02: 2 [IU] via SUBCUTANEOUS
  Administered 2022-08-02: 5 [IU] via SUBCUTANEOUS
  Administered 2022-08-02 – 2022-08-03 (×2): 3 [IU] via SUBCUTANEOUS
  Administered 2022-08-03: 2 [IU] via SUBCUTANEOUS
  Administered 2022-08-03 – 2022-08-04 (×2): 3 [IU] via SUBCUTANEOUS
  Administered 2022-08-04: 5 [IU] via SUBCUTANEOUS
  Administered 2022-08-05: 2 [IU] via SUBCUTANEOUS
  Administered 2022-08-05: 3 [IU] via SUBCUTANEOUS
  Administered 2022-08-05: 2 [IU] via SUBCUTANEOUS
  Administered 2022-08-06 – 2022-08-07 (×6): 3 [IU] via SUBCUTANEOUS
  Administered 2022-08-08: 2 [IU] via SUBCUTANEOUS
  Administered 2022-08-08: 3 [IU] via SUBCUTANEOUS
  Administered 2022-08-08: 5 [IU] via SUBCUTANEOUS
  Administered 2022-08-09: 2 [IU] via SUBCUTANEOUS
  Administered 2022-08-09: 3 [IU] via SUBCUTANEOUS
  Administered 2022-08-10: 2 [IU] via SUBCUTANEOUS
  Administered 2022-08-10 (×2): 3 [IU] via SUBCUTANEOUS
  Administered 2022-08-11: 5 [IU] via SUBCUTANEOUS
  Filled 2022-07-30 (×27): qty 1

## 2022-07-30 MED ORDER — HYDROCODONE-ACETAMINOPHEN 5-325 MG PO TABS
1.0000 | ORAL_TABLET | ORAL | Status: DC | PRN
  Administered 2022-08-04: 2 via ORAL
  Administered 2022-08-05: 1 via ORAL
  Administered 2022-08-05 – 2022-08-07 (×11): 2 via ORAL
  Administered 2022-08-08: 1 via ORAL
  Administered 2022-08-08 – 2022-08-10 (×5): 2 via ORAL
  Filled 2022-07-30 (×13): qty 2
  Filled 2022-07-30 (×2): qty 1
  Filled 2022-07-30 (×4): qty 2

## 2022-07-30 MED ORDER — PRAMIPEXOLE DIHYDROCHLORIDE 1 MG PO TABS
1.5000 mg | ORAL_TABLET | Freq: Two times a day (BID) | ORAL | Status: DC
  Administered 2022-07-30 – 2022-08-03 (×5): 1.5 mg via ORAL
  Filled 2022-07-30 (×8): qty 2

## 2022-07-30 NOTE — Assessment & Plan Note (Signed)
BP stable, continue metoprolol, and losartan

## 2022-07-30 NOTE — ED Triage Notes (Signed)
Pt reports has an infection in his left foot and is going to have to get some things amputated. Pt reports was at his MD office today and was told to come to the ED for IV abx and surgery to remove some things.

## 2022-07-30 NOTE — Assessment & Plan Note (Addendum)
History of left foot I&D, left second MTP joint with bone biopsy and culture on 06/05/22 with negative cultures s/p doxycycline Foot x-ray, left showed changes consistent with early osteomyelitis ABIs from November showed resting ABI within normal limits on the right, on the left midrange arterial occlusive disease.   Zosyn and vancomycin ordered Podiatry consult to consider angiogram

## 2022-07-30 NOTE — H&P (Addendum)
History and Physical    Patient: Ricardo Brown GHW:[id number] DOB: 02-03-1961 DOA: 07/30/2022 DOS: the patient was seen and examined on 07/30/2022 PCP: [name], MD  Patient coming from: Home  Chief Complaint:  Chief Complaint  Patient presents with   Wound Infection    HPI: Ricardo Brown is a 61 y.o. male with medical history significant for diabetes mellitus, , PAD, DM CAD status post CABG, history of chronic combined systolic and diastolic dysfunction, HTN, hospitalized from 10/31 to 11/7 with abscess of the first/second interspace and osteomyelitis of the second MTP joint s/p I&D, bone biopsy and culture on 11/3 with negative intraoperative wound culture who was sent to the ED from podiatry clinic for admission due to concern for recurrent osteomyelitis of the area.  Patient presented for routine follow-up and x-rays showed "cortical erosions at the medial head of the second metatarsal also consistent with early osteomyelitis changes".  Patient was sent in for IV antibiotics and debridement ED course and data review: Vitals within normal limits.  WBC 9.4 but with lactic acid 2.1.  Hemoglobin at baseline at 12, creatinine1.57, slightly above baseline of 1.18 a month ago.  EKG, personally interpreted shows sinus at 83 with RBBB.  Foot x-ray confirms developing osteomyelitis seen earlier at the office with recommendation for MRI for further evaluation.  Patient started on vancomycin and Zosyn and hospitalist consulted for admission.   Review of Systems: As mentioned in the history of present illness. All other systems reviewed and are negative.  Past Medical History:  Diagnosis Date   Chronic combined systolic (congestive) and diastolic (congestive) heart failure ([place])    a. 01/2018 EF 35-40%; b. 10/2020 Echo: EF 40-45%, glob HK. RVSP 42.46mHg. Mildly dil LA. Mild MS/AoV sclerosis.   CKD (chronic kidney disease), stage Brown (HCC)    Coronary artery disease    a.  01/2018 late presenting inferior MI; b. 01/2018 Cath: Severe multivessel dzs-->CABG x 3 (LIMA->LAD, VG->OM2, VG->RPDA @ [facility]).   Diabetes mellitus without complication ([place]    a. Dx ~ 2000.   Heart palpitations    a. Pt reports prior nl echo's and stress tests. Last stress test w/in past 2 yrs - PCP.   High cholesterol    Hypertension    Mitral regurgitation    a. 01/2018 s/p MV repair @ time of CABG.   Myocardial infarction ([facility]    Postoperative atrial fibrillation    a. 01/2018 @ time of CABG.   PSVT (paroxysmal supraventricular tachycardia)    a. Very symptomatic with multiple ED evaluations.  Has been on amio.   Severe sepsis with acute organ dysfunction ([place] 12/19/2021   Sleep difficulties 09/07/2019   STEMI (ST elevation myocardial infarction) ([place] 01/24/2018   Past Surgical History:  Procedure Laterality Date   AMPUTATION TOE Right 08/29/2021   Procedure: AMPUTATION TOE-2nd toe;  Surgeon: CSharlotte Alamo DPM;  Location: [facility] ORS;  Service: Podiatry;  Laterality: Right;   AMPUTATION TOE Left 12/22/2021   Procedure: AMPUTATION TOE-2nd Toe Partial;  Surgeon: BCaroline More DPM;  Location: [facility] ORS;  Service: Podiatry;  Laterality: Left;   CARDIAC CATHETERIZATION     CARDIAC VALVE REPLACEMENT     Mitral Valve Repair   COLONOSCOPY WITH PROPOFOL N/A 06/07/2019   Procedure: COLONOSCOPY WITH PROPOFOL;  Surgeon: TVirgel Manifold MD;  Location: [facility] ENDOSCOPY;  Service: Endoscopy;  Laterality: N/A;   COLONOSCOPY WITH PROPOFOL N/A 02/07/2020   Procedure: COLONOSCOPY WITH PROPOFOL;  Surgeon: TVirgel Manifold MD;  Location: [facility] ENDOSCOPY;  Service: Endoscopy;  Laterality: N/A;   COLONOSCOPY WITH PROPOFOL N/A 12/11/2020   Procedure: COLONOSCOPY WITH PROPOFOL;  Surgeon: [name], MD;  Location: [facility] ENDOSCOPY;  Service: Endoscopy;  Laterality: N/A;   CORONARY ANGIOPLASTY     CORONARY ARTERY BYPASS GRAFT     x 3   01/2018   ESOPHAGOGASTRODUODENOSCOPY N/A 02/07/2020   Procedure:  ESOPHAGOGASTRODUODENOSCOPY (EGD);  Surgeon: [name], MD;  Location: [facility] ENDOSCOPY;  Service: Endoscopy;  Laterality: N/A;   INCISION AND DRAINAGE Left 06/05/2022   Procedure: INCISION AND DRAINAGE;  Surgeon: Caroline More, DPM;  Location: [facility] ORS;  Service: Podiatry;  Laterality: Left;   LEFT HEART CATH AND CORONARY ANGIOGRAPHY N/A 01/24/2018   Procedure: LEFT HEART CATH AND CORONARY ANGIOGRAPHY;  Surgeon: [name], MD;  Location: [place] CV LAB;  Service: Cardiovascular;  Laterality: N/A;   LOWER EXTREMITY ANGIOGRAPHY Left 06/08/2022   Procedure: Lower Extremity Angiography;  Surgeon: Algernon Huxley, MD;  Location: [place] CV LAB;  Service: Cardiovascular;  Laterality: Left;   SVT ABLATION N/A 09/12/2021   Procedure: SVT ABLATION;  Surgeon: Vickie Epley, MD;  Location: [place] CV LAB;  Service: Cardiovascular;  Laterality: N/A;   TONSILLECTOMY     Social History:  reports that he has never smoked. He has never used smokeless tobacco. He reports that he does not currently use alcohol. He reports that he does not use drugs.  Allergies  Allergen Reactions   Lipitor [Atorvastatin] Palpitations    SVT   Sacubitril-Valsartan Other (See Comments)    SVT Other reaction(s): Other (see comments) Other reaction(s): Other (See Comments) SVT SVT   Pregabalin Palpitations    SVT Other reaction(s): Other (see comments) SVT SVT    Family History  Problem Relation Age of Onset   Cancer Mother        died @ 22   CAD Father        First MI @ 67. S/p heart transplant. Died in mid-50's of cancer.   Cancer Father    Other Brother        alive and well    Prior to Admission medications   Medication Sig Start Date End Date Taking? Authorizing Provider  amiodarone (PACERONE) 200 MG tablet Take 200 mg by mouth daily.    [provider]  ascorbic acid (VITAMIN C) 500 MG tablet Take 1 tablet (500 mg total) by mouth 2 (two) times daily. 06/09/22   Rai,  Vernelle Emerald, MD  aspirin 81 MG EC tablet Take 81 mg by mouth daily.     [provider]  b complex vitamins tablet Take 1 tablet by mouth daily.     [provider]  clopidogrel (PLAVIX) 75 MG tablet Take 1 tablet (75 mg total) by mouth daily. 06/10/22   Rai, Vernelle Emerald, MD  cyclobenzaprine (FLEXERIL) 5 MG tablet Take 1 tablet (5 mg total) by mouth 3 (three) times daily as needed for muscle spasms. 06/18/22   Theresia Lo, NP  Dulaglutide 1.5 MG/0.5ML SOPN Inject 1.5 mg into the skin every Saturday.    [provider]  empagliflozin (JARDIANCE) 25 MG TABS tablet Take 25 mg by mouth daily.    [provider]  gabapentin (NEURONTIN) 100 MG capsule Take 1 capsule by mouth twice daily 06/29/22   [name], MD  insulin degludec (TRESIBA) 200 UNIT/ML FlexTouch Pen 32 Units at bedtime.    [provider]  losartan (COZAAR) 25 MG  tablet Take 1 tablet (25 mg total) by mouth daily. HOLD UNTIL FOLLOW-UP WITH YOUR DOCTOR 06/09/22   Rai, Vernelle Emerald, MD  meloxicam (MOBIC) 7.5 MG tablet Take 1 tablet (7.5 mg total) by mouth daily. 06/18/22   Theresia Lo, NP  metFORMIN (GLUCOPHAGE) 500 MG tablet Take 2 tablets (1,000 mg total) by mouth 2 (two) times daily. 10/23/21   Theresia Lo, NP  metolazone (ZAROXOLYN) 2.5 MG tablet Take 1 tablet (2.5 mg total) by mouth daily. HOLD UNTIL FOLLOW-UP WITH YOUR DOCTOR 06/09/22   Rai, Vernelle Emerald, MD  metoprolol succinate (TOPROL-XL) 50 MG 24 hr tablet TAKE 1 TABLET BY MOUTH DAILY. TAKE WITH OR IMMEDIATELY FOLLOWING A MEAL. 06/29/22   [name], MD  Omega-3 Fatty Acids (FISH OIL) 875 MG CAPS Take 875 mg by mouth 2 (two) times daily.    [provider]  pramipexole (MIRAPEX) 1.5 MG tablet Take 1 tablet (1.5 mg total) by mouth 2 (two) times daily. 05/21/21   [name], MD  rosuvastatin (CRESTOR) 20 MG tablet Take 1 tablet (20 mg total) by mouth daily. 07/23/22 10/21/22  [name], MD  torsemide  (DEMADEX) 20 MG tablet Take 2 tablets (40 mg total) by mouth daily. 40 mg daily 06/22/22   [name], MD    Physical Exam: Vitals:   07/30/22 1427 07/30/22 1429  BP:  136/63  Pulse:  80  Resp:  20  Temp:  98.2 F (36.8 C)  TempSrc:  Oral  SpO2:  100%  Weight: 94.8 kg   Height: '5\' 8"'$  (1.727 m)    Physical Exam Vitals and nursing note reviewed.  Constitutional:      General: He is not in acute distress. HENT:     Head: Normocephalic and atraumatic.  Cardiovascular:     Rate and Rhythm: Normal rate and regular rhythm.     Heart sounds: Normal heart sounds.  Pulmonary:     Effort: Pulmonary effort is normal.     Breath sounds: Normal breath sounds.  Abdominal:     Palpations: Abdomen is soft.     Tenderness: There is no abdominal tenderness.  Musculoskeletal:     Comments: Left foot in bandage  Neurological:     Mental Status: Mental status is at baseline.     Labs on Admission: I have personally reviewed following labs and imaging studies  CBC: Recent Labs  Lab 07/30/22 1435  WBC 9.4  NEUTROABS 7.0  HGB 12.0*  HCT 37.9*  MCV 89.6  PLT 209   Basic Metabolic Panel: Recent Labs  Lab 07/30/22 1435  NA 136  K 4.3  CL 100  CO2 21*  GLUCOSE 193*  BUN 41*  CREATININE 1.57*  CALCIUM 9.2   GFR: Estimated Creatinine Clearance: 55.2 mL/min (A) (by C-G formula based on SCr of 1.57 mg/dL (H)). Liver Function Tests: Recent Labs  Lab 07/30/22 1435  AST 16  ALT 15  ALKPHOS 61  BILITOT 0.6  PROT 8.5*  ALBUMIN 4.1   No results for input(s): "LIPASE", "AMYLASE" in the last 168 hours. No results for input(s): "AMMONIA" in the last 168 hours. Coagulation Profile: No results for input(s): "INR", "PROTIME" in the last 168 hours. Cardiac Enzymes: No results for input(s): "CKTOTAL", "CKMB", "CKMBINDEX", "TROPONINI" in the last 168 hours. BNP (last 3 results) No results for input(s): "PROBNP" in the last 8760 hours. HbA1C: No results for input(s):  "HGBA1C" in the last 72 hours. CBG: No results for input(s): "GLUCAP" in the last 168 hours. Lipid  Profile: No results for input(s): "CHOL", "HDL", "LDLCALC", "TRIG", "CHOLHDL", "LDLDIRECT" in the last 72 hours. Thyroid Function Tests: No results for input(s): "TSH", "T4TOTAL", "FREET4", "T3FREE", "THYROIDAB" in the last 72 hours. Anemia Panel: No results for input(s): "VITAMINB12", "FOLATE", "FERRITIN", "TIBC", "IRON", "RETICCTPCT" in the last 72 hours. Urine analysis:    Component Value Date/Time   COLORURINE YELLOW (A) 05/28/2022 0226   APPEARANCEUR CLEAR (A) 05/28/2022 0226   LABSPEC 1.014 05/28/2022 0226   PHURINE 6.0 05/28/2022 0226   GLUCOSEU >=500 (A) 05/28/2022 0226   HGBUR NEGATIVE 05/28/2022 0226   BILIRUBINUR NEGATIVE 05/28/2022 0226   KETONESUR NEGATIVE 05/28/2022 0226   PROTEINUR NEGATIVE 05/28/2022 0226   NITRITE NEGATIVE 05/28/2022 0226   LEUKOCYTESUR NEGATIVE 05/28/2022 0226    Radiological Exams on Admission: DG Foot Complete Left  Result Date: 07/30/2022 CLINICAL DATA:  Diabetic foot infection EXAM: LEFT FOOT - COMPLETE 3+ VIEW COMPARISON:  06/02/2022 FINDINGS: No acute fracture or dislocation. Prior resection of the second toe distal phalanx. There is periarticular osteopenia of the fourth toe middle phalanx adjacent to the PIP joint. There is also osteopenia of the second metatarsal head and base of the second toe proximal phalanx. Possible developing erosion along the medial aspect of the first metatarsal head. Generalized soft tissue swelling. Atherosclerotic vascular calcifications. No soft tissue gas. IMPRESSION: 1. Periarticular osteopenia of the fourth toe middle phalanx, as well as the second metatarsal head and base of the second toe proximal phalanx. Developing osteomyelitis is a concern given the clinical history. Consider MRI for further evaluation. 2. Possible developing erosion along the medial aspect of the first metatarsal head. Electronically Signed    By: Davina Poke D.O.   On: 07/30/2022 15:19     Data Reviewed: Relevant notes from primary care and specialist visits, past discharge summaries as available in EHR, including Care Everywhere. Prior diagnostic testing as pertinent to current admission diagnoses Updated medications and problem lists for reconciliation ED course, including vitals, labs, imaging, treatment and response to treatment Triage notes, nursing and pharmacy notes and ED provider's notes Notable results as noted in HPI   Assessment and Plan: * Osteomyelitis of left foot ([place]) History of left foot I&D, left second MTP joint with bone biopsy and culture on 06/05/22 with negative cultures s/p doxycycline Foot x-ray, left showed changes consistent with early osteomyelitis ABIs from November showed resting ABI within normal limits on the right, on the left midrange arterial occlusive disease.   Zosyn and vancomycin ordered Podiatry consult to consider angiogram     Acute renal failure superimposed on stage 2 chronic kidney disease ([place])  creatinine1.57, slightly above baseline of 1.18 a month ago IV hydration and monitor  Chronic combined systolic (congestive) and diastolic (congestive) heart failure ([place]) -2D echo 5/23 had shown EF of 45 to 50% Continue losartan Hold torsemide and Zaroxolyn due to AKI  Type 2 diabetes mellitus with diabetic chronic kidney disease (HCC) Sliding scale insulin coverage  PAD (peripheral artery disease) ([place]) History of left lower extremity angiogram on 11/6 Will hold Plavix given upcoming procedure.   Continue rosuvastatin  Hypertension BP stable, continue metoprolol, and losartan  Coronary artery disease No complaints of chest pain, EKG nonacute Continue rosuvastatin, aspirin and metoprolol.  Holding Plavix for procedure        DVT prophylaxis: SCD  Consults: Podiatry, Dr. Cleda Mccreedy  Advance Care Planning:   Code Status: Prior   Family Communication:  none  Disposition Plan: Back to previous home environment  Severity of  Illness: The appropriate patient status for this patient is INPATIENT. Inpatient status is judged to be reasonable and necessary in order to provide the required intensity of service to ensure the patient's safety. The patient's presenting symptoms, physical exam findings, and initial radiographic and laboratory data in the context of their chronic comorbidities is felt to place them at high risk for further clinical deterioration. Furthermore, it is not anticipated that the patient will be medically stable for discharge from the hospital within 2 midnights of admission.   * I certify that at the point of admission it is my clinical judgment that the patient will require inpatient hospital care spanning beyond 2 midnights from the point of admission due to high intensity of service, high risk for further deterioration and high frequency of surveillance required.*  Author: Athena Masse, MD 07/30/2022 7:13 PM  For on call review [email or web address].

## 2022-07-30 NOTE — Assessment & Plan Note (Signed)
Sliding scale insulin coverage 

## 2022-07-30 NOTE — Assessment & Plan Note (Addendum)
No complaints of chest pain, EKG nonacute Continue rosuvastatin, aspirin and metoprolol.  Holding Plavix for procedure

## 2022-07-30 NOTE — Assessment & Plan Note (Signed)
creatinine1.57, slightly above baseline of 1.18 a month ago IV hydration and monitor

## 2022-07-30 NOTE — Assessment & Plan Note (Addendum)
History of left lower extremity angiogram on 11/6 Will hold Plavix given upcoming procedure.   Continue rosuvastatin

## 2022-07-30 NOTE — Assessment & Plan Note (Signed)
-  2D echo 5/23 had shown EF of 45 to 50% Continue losartan Hold torsemide and Zaroxolyn due to AKI

## 2022-07-30 NOTE — ED Provider Notes (Signed)
[facility] Provider Note    Event Date/Time   First MD Initiated Contact with Patient 07/30/22 1712     (approximate)   History   Chief Complaint Wound Infection   HPI  Ricardo Brown is a 61 y.o. male with past medical history of hypertension, diabetes, CAD, CHF, and CKD who presents to the ED complaining of wound infection.  Patient reports that he was recently admitted to the hospital for infection between his first and second toes on the left foot.  He had washout performed at that time but wound has not healed.  He went to be seen by his podiatrist today who was concerned about ongoing infection in this area as well as new ulceration near his fourth toe.  Patient reports that he was sent to the hospital for IV antibiotics as well as amputation of toes on his left foot.  He states he is otherwise felt well with no fevers, nausea, vomiting, or pain in his foot.     Physical Exam   Triage Vital Signs: ED Triage Vitals  Enc Vitals Group     BP 07/30/22 1429 136/63     Pulse Rate 07/30/22 1429 80     Resp 07/30/22 1429 20     Temp 07/30/22 1429 98.2 F (36.8 C)     Temp Source 07/30/22 1429 Oral     SpO2 07/30/22 1429 100 %     Weight 07/30/22 1427 209 lb (94.8 kg)     Height 07/30/22 1427 '5\' 8"'$  (1.727 m)     Head Circumference --      Peak Flow --      Pain Score 07/30/22 1427 0     Pain Loc --      Pain Edu? --      Excl. in [place]? --     Most recent vital signs: Vitals:   07/30/22 1429  BP: 136/63  Pulse: 80  Resp: 20  Temp: 98.2 F (36.8 C)  SpO2: 100%    Constitutional: Alert and oriented. Eyes: Conjunctivae are normal. Head: Atraumatic. Nose: No congestion/rhinnorhea. Mouth/Throat: Mucous membranes are moist.  Cardiovascular: Normal rate, regular rhythm. Grossly normal heart sounds.  2+ radial pulses bilaterally. Respiratory: Normal respiratory effort.  No retractions. Lungs CTAB. Gastrointestinal: Soft and nontender.  No distention. Musculoskeletal: Dressing in place to left foot. Neurologic:  Normal speech and language. No gross focal neurologic deficits are appreciated.    ED Results / Procedures / Treatments   Labs (all labs ordered are listed, but only abnormal results are displayed) Labs Reviewed  LACTIC ACID, PLASMA - Abnormal; Notable for the following components:      Result Value   Lactic Acid, Venous 2.1 (*)    All other components within normal limits  COMPREHENSIVE METABOLIC PANEL - Abnormal; Notable for the following components:   CO2 21 (*)    Glucose, Bld 193 (*)    BUN 41 (*)    Creatinine, Ser 1.57 (*)    Total Protein 8.5 (*)    GFR, Estimated 50 (*)    All other components within normal limits  CBC WITH DIFFERENTIAL/PLATELET - Abnormal; Notable for the following components:   Hemoglobin 12.0 (*)    HCT 37.9 (*)    All other components within normal limits  CULTURE, BLOOD (ROUTINE X 2)  CULTURE, BLOOD (ROUTINE X 2)  LACTIC ACID, PLASMA    RADIOLOGY Left foot x-ray reviewed and interpreted by me with evidence of  osteomyelitis at the fourth toe and distal second metatarsal.  PROCEDURES:  Critical Care performed: No  Procedures   MEDICATIONS ORDERED IN ED: Medications  vancomycin (VANCOCIN) IVPB 1000 mg/200 mL premix (has no administration in time range)  piperacillin-tazobactam (ZOSYN) IVPB 3.375 g (has no administration in time range)  lactated ringers bolus 1,000 mL (has no administration in time range)     IMPRESSION / MDM / ASSESSMENT AND PLAN / ED COURSE  I reviewed the triage vital signs and the nursing notes.                              61 y.o. male with past medical history of hypertension, diabetes, CAD status post CABG, CHF, and CKD who presents to the ED with ongoing wound between first and second toes of the left foot as well as new wound to the fourth toe.  Patient's presentation is most consistent with acute presentation with potential threat  to life or bodily function.  Differential diagnosis includes, but is not limited to, osteomyelitis, diabetic foot infection, abscess, sepsis.  Patient nontoxic-appearing and in no acute distress, vital signs are reassuring and do not appear concerning for sepsis.  Patient evaluated by Dr. Cleda Mccreedy of podiatry earlier today with exam concerning for osteomyelitis, x-ray today shows findings between first and second toe as well as at the fourth toe concerning for osteomyelitis.  We will draw blood cultures and start on IV Vanco and Zosyn.  Labs show stable anemia with no significant leukocytosis, mild AKI noted with no electrolyte abnormality.  Lactic acid mildly elevated, we will hydrate with IV fluids and recheck.  Case discussed with hospitalist for admission.      FINAL CLINICAL IMPRESSION(S) / ED DIAGNOSES   Final diagnoses:  Osteomyelitis of left foot, unspecified type ([place])  Diabetic foot infection ([place])     Rx / DC Orders   ED Discharge Orders     None        Note:  This document was prepared using Dragon voice recognition software and may include unintentional dictation errors.   Blake Divine, MD 07/30/22 [id number]

## 2022-07-31 ENCOUNTER — Inpatient Hospital Stay: Payer: Medicare Other

## 2022-07-31 ENCOUNTER — Encounter: Payer: Self-pay | Admitting: Internal Medicine

## 2022-07-31 DIAGNOSIS — M86172 Other acute osteomyelitis, left ankle and foot: Secondary | ICD-10-CM

## 2022-07-31 DIAGNOSIS — I10 Essential (primary) hypertension: Secondary | ICD-10-CM | POA: Diagnosis not present

## 2022-07-31 DIAGNOSIS — I5042 Chronic combined systolic (congestive) and diastolic (congestive) heart failure: Secondary | ICD-10-CM

## 2022-07-31 DIAGNOSIS — Z794 Long term (current) use of insulin: Secondary | ICD-10-CM

## 2022-07-31 DIAGNOSIS — N1832 Chronic kidney disease, stage 3b: Secondary | ICD-10-CM

## 2022-07-31 DIAGNOSIS — E1122 Type 2 diabetes mellitus with diabetic chronic kidney disease: Secondary | ICD-10-CM

## 2022-07-31 LAB — CBG MONITORING, ED
Glucose-Capillary: 108 mg/dL — ABNORMAL HIGH (ref 70–99)
Glucose-Capillary: 112 mg/dL — ABNORMAL HIGH (ref 70–99)
Glucose-Capillary: 117 mg/dL — ABNORMAL HIGH (ref 70–99)

## 2022-07-31 LAB — GLUCOSE, CAPILLARY
Glucose-Capillary: 108 mg/dL — ABNORMAL HIGH (ref 70–99)
Glucose-Capillary: 118 mg/dL — ABNORMAL HIGH (ref 70–99)

## 2022-07-31 LAB — CREATININE, SERUM
Creatinine, Ser: 1.4 mg/dL — ABNORMAL HIGH (ref 0.61–1.24)
GFR, Estimated: 57 mL/min — ABNORMAL LOW (ref >60)

## 2022-07-31 LAB — LACTIC ACID, PLASMA: Lactic Acid, Venous: 0.9 mmol/L (ref 0.5–1.9)

## 2022-07-31 MED ORDER — VANCOMYCIN HCL 1250 MG/250ML IV SOLN
1250.0000 mg | INTRAVENOUS | Status: DC
  Administered 2022-08-01: 1250 mg via INTRAVENOUS
  Filled 2022-07-31 (×2): qty 250

## 2022-07-31 MED ORDER — PIPERACILLIN-TAZOBACTAM 3.375 G IVPB
3.3750 g | Freq: Three times a day (TID) | INTRAVENOUS | Status: DC
  Administered 2022-07-31 – 2022-08-06 (×17): 3.375 g via INTRAVENOUS
  Filled 2022-07-31 (×18): qty 50

## 2022-07-31 MED ORDER — VANCOMYCIN HCL 1500 MG/300ML IV SOLN
1500.0000 mg | Freq: Once | INTRAVENOUS | Status: AC
  Administered 2022-07-31: 1500 mg via INTRAVENOUS
  Filled 2022-07-31: qty 300

## 2022-07-31 MED ORDER — PIPERACILLIN-TAZOBACTAM 3.375 G IVPB 30 MIN
3.3750 g | Freq: Once | INTRAVENOUS | Status: AC
  Administered 2022-07-31: 3.375 g via INTRAVENOUS
  Filled 2022-07-31: qty 50

## 2022-07-31 NOTE — Progress Notes (Signed)
[facility]                                                                               Ricardo Brown, is a 61 y.o. male, DOB - Oct 09, 1960, PNT:[id number] Admit date - 07/30/2022    Outpatient Primary MD for the patient is [name], MD  LOS - 1  days    Brief summary    Ricardo Brown is a 61 y.o. male with medical history significant for diabetes mellitus, , PAD, DM CAD status post CABG, history of chronic combined systolic and diastolic dysfunction, HTN, hospitalized from 10/31 to 11/7 with abscess of the first/second interspace and osteomyelitis of the second MTP joint s/p I&D, bone biopsy and culture on 11/3 with negative intraoperative wound culture who was sent to the ED from podiatry clinic for admission due to concern for recurrent osteomyelitis of the area.  Patient presented for routine follow-up and x-rays showed "cortical erosions at the medial head of the second metatarsal also consistent with early osteomyelitis changes".  Patient was sent in for IV antibiotics and debridement .  MRI of the foot ordered by Dr Cleda Mccreedy earlier today.    Assessment & Plan    Assessment and Plan: * Osteomyelitis of left foot (HCC) History of left foot I&D, left second MTP joint with bone biopsy and culture on 06/05/22 with negative cultures s/p doxycycline Foot x-ray, left showed changes consistent with early osteomyelitis. He was started on broad spectrum IV antibiotics.  ABIs from November showed resting ABI within normal limits on the right, on the left midrange arterial occlusive disease.   Podiatry on board.  MRI of the left  foot ordered by Podiatry.  Lactic acid level wnl.  Wbc count wnl.     Acute renal failure superimposed on stage 2 chronic kidney disease (HCC)  creatinine1.57, slightly above baseline of 1.18 a month ago IV hydration and monitor. Repeat renal parameters show improvement 1.4.   Chronic combined systolic (congestive) and  diastolic (congestive) heart failure ([place]) -2D echo 5/23 had shown EF of 45 to 50% Continue losartan Hold torsemide and Zaroxolyn due to AKI  Type 2 diabetes mellitus with diabetic chronic kidney disease (HCC) CBG (last 3)  Recent Labs    07/31/22 1148 07/31/22 1629 07/31/22 1725  GLUCAP 112* 117* 108*   Resume SSI.    PAD (peripheral artery disease) ([place]) History of left lower extremity angiogram on 11/6 Will hold Plavix given upcoming procedure.   Continue rosuvastatin  Hypertension BP stable,well controlled.   continue metoprolol, and losartan  Coronary artery disease No complaints of chest pain, EKG nonacute Continue rosuvastatin, aspirin and metoprolol.   Holding Plavix for procedure   Estimated body mass index is 31.78 kg/m as calculated from the following:   Height as of this encounter: '5\' 8"'$  (1.727 m).   Weight as of this encounter: 94.8 kg.  Code Status: full code.  DVT Prophylaxis:  SCDs Start: 07/30/22 1940   Level of Care: Level of care: Med-Surg Family Communication: none at bedside.   Disposition Plan:     Remains inpatient appropriate:  OSTEOMYELITIS.   Procedures:  MRI OF THE LEFT FOOT.  Consultants:   PODIATRY.   Antimicrobials:   Anti-infectives (From admission, onward)    Start     Dose/Rate Route Frequency Ordered Stop   08/01/22 1300  vancomycin (VANCOREADY) IVPB 1250 mg/250 mL        1,250 mg 166.7 mL/hr over 90 Minutes Intravenous Every 24 hours 07/31/22 1618     07/31/22 2200  piperacillin-tazobactam (ZOSYN) IVPB 3.375 g        3.375 g 12.5 mL/hr over 240 Minutes Intravenous Every 8 hours 07/31/22 1445     07/31/22 1100  piperacillin-tazobactam (ZOSYN) IVPB 3.375 g        3.375 g 100 mL/hr over 30 Minutes Intravenous  Once 07/31/22 1046 07/31/22 1441   07/31/22 1100  vancomycin (VANCOREADY) IVPB 1500 mg/300 mL        1,500 mg 150 mL/hr over 120 Minutes Intravenous  Once 07/31/22 1056 07/31/22 1604   07/30/22 1745   vancomycin (VANCOCIN) IVPB 1000 mg/200 mL premix        1,000 mg 200 mL/hr over 60 Minutes Intravenous  Once 07/30/22 1743 07/30/22 2054   07/30/22 1745  piperacillin-tazobactam (ZOSYN) IVPB 3.375 g        3.375 g 100 mL/hr over 30 Minutes Intravenous  Once 07/30/22 1743 07/30/22 2028        Medications  Scheduled Meds:  clopidogrel  75 mg Oral Daily   gabapentin  100 mg Oral BID   insulin aspart  0-15 Units Subcutaneous TID WC   insulin aspart  0-5 Units Subcutaneous QHS   losartan  25 mg Oral Daily   metoprolol succinate  50 mg Oral Daily   pramipexole  1.5 mg Oral BID   rosuvastatin  20 mg Oral Daily   Continuous Infusions:  piperacillin-tazobactam (ZOSYN)  IV     [START ON 08/01/2022] vancomycin     PRN Meds:.acetaminophen **OR** acetaminophen, HYDROcodone-acetaminophen, morphine injection, ondansetron **OR** ondansetron (ZOFRAN) IV    Subjective:   Ricardo Brown was seen and examined today.  NO new complaints.   Objective:   Vitals:   07/31/22 0605 07/31/22 0900 07/31/22 1148 07/31/22 1500  BP: 106/61 110/65 112/75 116/80  Pulse: 72 75 80 85  Resp: '16 16 18 20  '$ Temp: 98 F (36.7 C) 98.4 F (36.9 C) 98.7 F (37.1 C) 98.8 F (37.1 C)  TempSrc: Oral Oral Oral Oral  SpO2: 99% 100% 100% 95%  Weight:      Height:        Intake/Output Summary (Last 24 hours) at 07/31/2022 1628 Last data filed at 07/30/2022 2054 Gross per 24 hour  Intake 1250 ml  Output --  Net 1250 ml   Filed Weights   07/30/22 1427  Weight: 94.8 kg     Exam General: Alert and oriented x 3, NAD Cardiovascular: S1 S2 auscultated, no murmurs, RRR Respiratory: Clear to auscultation bilaterally, no wheezing, rales or rhonchi Gastrointestinal: Soft, nontender, nondistended, + bowel sounds Ext: left foot bandaged.  Neuro: AAOx3, Cr N's II- XII. Skin: No rashes Psych: Normal affect and demeanor, alert and oriented x3    Data Reviewed:  I have personally reviewed following labs  and imaging studies   CBC Lab Results  Component Value Date   WBC 9.4 07/30/2022   RBC 4.23 07/30/2022   HGB 12.0 (L) 07/30/2022   HCT 37.9 (L) 07/30/2022   MCV 89.6 07/30/2022   MCH 28.4 07/30/2022   PLT 302 07/30/2022   MCHC 31.7 07/30/2022   RDW 13.8 07/30/2022  LYMPHSABS 1.4 07/30/2022   MONOABS 0.7 07/30/2022   EOSABS 0.2 07/30/2022   BASOSABS 0.1 56/21/3086     Last metabolic panel Lab Results  Component Value Date   NA 136 07/30/2022   K 4.3 07/30/2022   CL 100 07/30/2022   CO2 21 (L) 07/30/2022   BUN 41 (H) 07/30/2022   CREATININE 1.40 (H) 07/31/2022   GLUCOSE 193 (H) 07/30/2022   GFRNONAA 57 (L) 07/31/2022   GFRAA 69 09/30/2020   CALCIUM 9.2 07/30/2022   PHOS 3.9 05/27/2022   PROT 8.5 (H) 07/30/2022   ALBUMIN 4.1 07/30/2022   BILITOT 0.6 07/30/2022   ALKPHOS 61 07/30/2022   AST 16 07/30/2022   ALT 15 07/30/2022   ANIONGAP 15 07/30/2022    CBG (last 3)  Recent Labs    07/30/22 2202 07/31/22 0803 07/31/22 1148  GLUCAP 111* 108* 112*      Coagulation Profile: No results for input(s): "INR", "PROTIME" in the last 168 hours.   Radiology Studies: MR FOOT LEFT WO CONTRAST  Result Date: 07/31/2022 CLINICAL DATA:  Diabetic foot wound. EXAM: MRI OF THE LEFT FOOT WITHOUT CONTRAST TECHNIQUE: Multiplanar, multisequence MR imaging of the left foot was performed. No intravenous contrast was administered. COMPARISON:  Prior MRI 06/04/2022 and x-rays from yesterday. FINDINGS: Significant progression of septic arthritis at the second MTP joint with extensive osteomyelitis involving the second metatarsal head and neck and also the proximal phalanx. Surrounding fluid collection consistent with an abscess extending back along the second metatarsal. Septic arthritis at the PIP joint of the fourth toe with extensive osteomyelitis involving the proximal and middle phalanges. No septic arthritis at the fourth MTP joint. Diffuse cellulitis and extensive myofasciitis  most notably involving the plantar foot musculature. I do not see a discrete fluid collection but could not exclude pyomyositis without contrast. The visualized midfoot bony structures are intact. IMPRESSION: 1. Significant progression of septic arthritis at the second MTP joint with extensive osteomyelitis involving the second metatarsal head and neck and also the proximal phalanx. Surrounding fluid collection consistent with an abscess extending back along the second metatarsal. 2. Septic arthritis at the PIP joint of the fourth toe with extensive osteomyelitis involving the proximal and middle phalanges. 3. Diffuse cellulitis and extensive myofasciitis most notably involving the plantar foot musculature. I do not see a discrete fluid collection but could not exclude pyomyositis without contrast. Electronically Signed   By: Marijo Sanes M.D.   On: 07/31/2022 11:50   DG Foot Complete Left  Result Date: 07/30/2022 CLINICAL DATA:  Diabetic foot infection EXAM: LEFT FOOT - COMPLETE 3+ VIEW COMPARISON:  06/02/2022 FINDINGS: No acute fracture or dislocation. Prior resection of the second toe distal phalanx. There is periarticular osteopenia of the fourth toe middle phalanx adjacent to the PIP joint. There is also osteopenia of the second metatarsal head and base of the second toe proximal phalanx. Possible developing erosion along the medial aspect of the first metatarsal head. Generalized soft tissue swelling. Atherosclerotic vascular calcifications. No soft tissue gas. IMPRESSION: 1. Periarticular osteopenia of the fourth toe middle phalanx, as well as the second metatarsal head and base of the second toe proximal phalanx. Developing osteomyelitis is a concern given the clinical history. Consider MRI for further evaluation. 2. Possible developing erosion along the medial aspect of the first metatarsal head. Electronically Signed   By: Davina Poke D.O.   On: 07/30/2022 15:19       Hosie Poisson  M.D. [facility] 07/31/2022, 4:28 PM  Available via Epic secure chat 7am-7pm After 7 pm, please refer to night coverage provider listed on amion.

## 2022-07-31 NOTE — Consult Note (Signed)
Pharmacy Antibiotic Note  Ricardo Brown is a 61 y.o. male admitted on 07/30/2022 with  osteomyelitis of left foot .  Pharmacy has been consulted for vancomycin and Zosyn dosing.  12/28: Patient got Zosyn 3.375 g @ 1858, vancomycin 1 gram @ 1925  Plan: Start Zosyn 3.375 grams IV every 8 hours Give vancomycin 1500 mg IV x 1, then start vancomycin 1250 mg IV every 24 hours Estimated AUC 539.4, Cmin 12.7 Vancomycin levels as clinically appropriate Monitor culture results and renal function for adjustment   Height: '5\' 8"'$  (172.7 cm) Weight: 94.8 kg (209 lb) IBW/kg (Calculated) : 68.4  Temp (24hrs), Avg:98.1 F (36.7 C), Min:98 F (36.7 C), Max:98.2 F (36.8 C)  Recent Labs  Lab 07/30/22 1435  WBC 9.4  CREATININE 1.57*  LATICACIDVEN 2.1*    Estimated Creatinine Clearance: 55.2 mL/min (A) (by C-G formula based on SCr of 1.57 mg/dL (H)).    Allergies  Allergen Reactions   Lipitor [Atorvastatin] Palpitations    SVT   Sacubitril-Valsartan Other (See Comments)    SVT Other reaction(s): Other (see comments) Other reaction(s): Other (See Comments) SVT SVT   Pregabalin Palpitations    SVT Other reaction(s): Other (see comments) SVT SVT    Antimicrobials this admission: Vancomycin 12/28 >>  Zosyn 12/28 >>   Dose adjustments this admission: N/A  Microbiology results: 12/28 BCx: ngtd  Thank you for allowing pharmacy to be a part of this patient's care.  Lorin Picket 07/31/2022 10:56 AM

## 2022-07-31 NOTE — Consult Note (Signed)
Reason for Consult: Osteomyelitis left foot Referring Physician: Dany Walther Brown is an 61 y.o. male.  HPI: This is a 61 year old diabetic male with significant neuropathy as well as peripheral vascular disease who underwent an I&D last month.  Recently the wound has worsened as well as a new sore on his left fourth toe.  Patient had revascularization procedure last month.  Was seen outpatient and determination made that he would need hospitalization for IV antibiotics and amputation.  Past Medical History:  Diagnosis Date   Chronic combined systolic (congestive) and diastolic (congestive) heart failure ([place])    a. 01/2018 EF 35-40%; b. 10/2020 Echo: EF 40-45%, glob HK. RVSP 42.23mHg. Mildly dil LA. Mild MS/AoV sclerosis.   CKD (chronic kidney disease), stage Brown (HCC)    Coronary artery disease    a. 01/2018 late presenting inferior MI; b. 01/2018 Cath: Severe multivessel dzs-->CABG x 3 (LIMA->LAD, VG->OM2, VG->RPDA @ [facility]).   Diabetes mellitus without complication ([place]    a. Dx ~ 2000.   Heart palpitations    a. Pt reports prior nl echo's and stress tests. Last stress test w/in past 2 yrs - PCP.   High cholesterol    Hypertension    Mitral regurgitation    a. 01/2018 s/p MV repair @ time of CABG.   Myocardial infarction ([facility]    Postoperative atrial fibrillation    a. 01/2018 @ time of CABG.   PSVT (paroxysmal supraventricular tachycardia)    a. Very symptomatic with multiple ED evaluations.  Has been on amio.   Severe sepsis with acute organ dysfunction ([place] 12/19/2021   Sleep difficulties 09/07/2019   STEMI (ST elevation myocardial infarction) ([place] 01/24/2018    Past Surgical History:  Procedure Laterality Date   AMPUTATION TOE Right 08/29/2021   Procedure: AMPUTATION TOE-2nd toe;  Surgeon: Ricardo Brown;  Location: [facility] ORS;  Service: Podiatry;  Laterality: Right;   AMPUTATION TOE Left 12/22/2021   Procedure: AMPUTATION TOE-2nd Toe Partial;  Surgeon: Ricardo Brown  Brown;  Location: [facility] ORS;  Service: Podiatry;  Laterality: Left;   CARDIAC CATHETERIZATION     CARDIAC VALVE REPLACEMENT     Mitral Valve Repair   COLONOSCOPY WITH PROPOFOL N/A 06/07/2019   Procedure: COLONOSCOPY WITH PROPOFOL;  Surgeon: Ricardo Manifold Brown;  Location: [facility] ENDOSCOPY;  Service: Endoscopy;  Laterality: N/A;   COLONOSCOPY WITH PROPOFOL N/A 02/07/2020   Procedure: COLONOSCOPY WITH PROPOFOL;  Surgeon: Ricardo Manifold Brown;  Location: [facility] ENDOSCOPY;  Service: Endoscopy;  Laterality: N/A;   COLONOSCOPY WITH PROPOFOL N/A 12/11/2020   Procedure: COLONOSCOPY WITH PROPOFOL;  Surgeon: Ricardo Manifold Brown;  Location: [facility] ENDOSCOPY;  Service: Endoscopy;  Laterality: N/A;   CORONARY ANGIOPLASTY     CORONARY ARTERY BYPASS GRAFT     x 3   01/2018   ESOPHAGOGASTRODUODENOSCOPY N/A 02/07/2020   Procedure: ESOPHAGOGASTRODUODENOSCOPY (EGD);  Surgeon: Ricardo Manifold Brown;  [facility];  Service: Endoscopy;  Laterality: N/A;   INCISION AND DRAINAGE Left 06/05/2022   Procedure: INCISION AND DRAINAGE;  Surgeon: Ricardo Brown;  Location: [facility];  Service: Podiatry;  Laterality: Left;   LEFT HEART CATH AND CORONARY ANGIOGRAPHY N/A 01/24/2018   Procedure: LEFT HEART CATH AND CORONARY ANGIOGRAPHY;  Surgeon: Ricardo Brown;  [facility];  Service: Cardiovascular;  Laterality: N/A;   LOWER EXTREMITY ANGIOGRAPHY Left 06/08/2022   Procedure: Lower Extremity Angiography;  Surgeon: DAlgernon Huxley Brown;  [facility];  Service:  Cardiovascular;  Laterality: Left;   SVT ABLATION N/A 09/12/2021   Procedure: SVT ABLATION;  Surgeon: Ricardo Epley, Brown;  Location: [place] [facility];  Service: Cardiovascular;  Laterality: N/A;   TONSILLECTOMY      Family History  Problem Relation Age of Onset   Cancer Mother        died @ 62   CAD Father        First MI @ 22. S/p heart transplant. Died in mid-50's of cancer.   Cancer Father    Other  Brother        alive and well    Social History:  reports that he has never smoked. He has never used smokeless tobacco. He reports that he does not currently use alcohol. He reports that he does not use drugs.  Allergies:  Allergies  Allergen Reactions   Lipitor [Atorvastatin] Palpitations    SVT   Sacubitril-Valsartan Other (See Comments)    SVT Other reaction(s): Other (see comments) Other reaction(s): Other (See Comments) SVT SVT   Pregabalin Palpitations    SVT Other reaction(s): Other (see comments) SVT SVT    Medications: Scheduled:  clopidogrel  75 mg Oral Daily   gabapentin  100 mg Oral BID   insulin aspart  0-15 Units Subcutaneous TID WC   insulin aspart  0-5 Units Subcutaneous QHS   losartan  25 mg Oral Daily   metoprolol succinate  50 mg Oral Daily   pramipexole  1.5 mg Oral BID   rosuvastatin  20 mg Oral Daily    Results for orders placed or performed during the hospital encounter of 07/30/22 (from the past 48 hour(s))  Lactic acid, plasma     Status: Abnormal   Collection Time: 07/30/22  2:35 PM  Result Value Ref Range   Lactic Acid, Venous 2.1 (HH) 0.5 - 1.9 mmol/L    Comment: CRITICAL RESULT CALLED TO, READ BACK BY AND VERIFIED WITH Ricardo Brown '@1508'$  07/30/22 MJU Performed at [facility], [address] [place], [place] [address]   Comprehensive metabolic panel     Status: Abnormal   Collection Time: 07/30/22  2:35 PM  Result Value Ref Range   Sodium 136 135 - 145 mmol/L   Potassium 4.3 3.5 - 5.1 mmol/L   Chloride 100 98 - 111 mmol/L   CO2 21 (L) 22 - 32 mmol/L   Glucose, Bld 193 (H) 70 - 99 mg/dL    Comment: Glucose reference range applies only to samples taken after fasting for at least 8 hours.   BUN 41 (H) 8 - 23 mg/dL   Creatinine, Ser 1.57 (H) 0.61 - 1.24 mg/dL   Calcium 9.2 8.9 - 10.3 mg/dL   Total Protein 8.5 (H) 6.5 - 8.1 g/dL   Albumin 4.1 3.5 - 5.0 g/dL   AST 16 15 - 41 U/L   ALT 15 0 - 44 U/L   Alkaline Phosphatase 61  38 - 126 U/L   Total Bilirubin 0.6 0.3 - 1.2 mg/dL   GFR, Estimated 50 (L) >60 mL/min    Comment: (NOTE) Calculated using the CKD-EPI Creatinine Equation (2021)    Anion gap 15 5 - 15    Comment: Performed at [facility], [address] [place], [place] [address]  CBC with Differential     Status: Abnormal   Collection Time: 07/30/22  2:35 PM  Result Value Ref Range   WBC 9.4 4.0 - 10.5 K/uL   RBC 4.23 4.22 - 5.81  MIL/uL   Hemoglobin 12.0 (L) 13.0 - 17.0 g/dL   HCT 37.9 (L) 39.0 - 52.0 %   MCV 89.6 80.0 - 100.0 fL   MCH 28.4 26.0 - 34.0 pg   MCHC 31.7 30.0 - 36.0 g/dL   RDW 13.8 11.5 - 15.5 %   Platelets 302 150 - 400 K/uL   nRBC 0.0 0.0 - 0.2 %   Neutrophils Relative % 74 %   Neutro Abs 7.0 1.7 - 7.7 K/uL   Lymphocytes Relative 15 %   Lymphs Abs 1.4 0.7 - 4.0 K/uL   Monocytes Relative 7 %   Monocytes Absolute 0.7 0.1 - 1.0 K/uL   Eosinophils Relative 2 %   Eosinophils Absolute 0.2 0.0 - 0.5 K/uL   Basophils Relative 1 %   Basophils Absolute 0.1 0.0 - 0.1 K/uL   Immature Granulocytes 1 %   Abs Immature Granulocytes 0.06 0.00 - 0.07 K/uL    Comment: Performed at [facility], [address] [place], [place] [address]  Culture, blood (routine x 2)     Status: None (Preliminary result)   Collection Time: 07/30/22  6:48 PM   Specimen: BLOOD  Result Value Ref Range   Specimen Description BLOOD LEFT ANTECUBITAL    Special Requests      BOTTLES DRAWN AEROBIC AND ANAEROBIC Blood Culture adequate volume   Culture      NO GROWTH < 12 HOURS Performed at [facility], [address] [place], [place] [address]    Report Status PENDING   Culture, blood (routine x 2)     Status: None (Preliminary result)   Collection Time: 07/30/22  7:00 PM   Specimen: BLOOD  Result Value Ref Range   Specimen Description BLOOD RIGHT ANTECUBITAL    Special Requests      BOTTLES DRAWN AEROBIC AND ANAEROBIC Blood Culture adequate volume   Culture      NO GROWTH <  12 HOURS Performed at [facility], [address] [place], [place] [address]    Report Status PENDING   Sedimentation rate     Status: Abnormal   Collection Time: 07/30/22  8:21 PM  Result Value Ref Range   Sed Rate 77 (H) 0 - 20 mm/hr    Comment: Performed at [facility], [address] [place], [place] [address]  C-reactive protein     Status: Abnormal   Collection Time: 07/30/22  8:21 PM  Result Value Ref Range   CRP 7.1 (H) <1.0 mg/dL    Comment: Performed at [facility], [place] [address], [place], [place] [address]  Prealbumin     Status: None   Collection Time: 07/30/22  8:21 PM  Result Value Ref Range   Prealbumin 20 18 - 38 mg/dL    Comment: Performed at [facility] [address], [place],  [address]  CBG monitoring, ED     Status: Abnormal   Collection Time: 07/30/22 10:02 PM  Result Value Ref Range   Glucose-Capillary 111 (H) 70 - 99 mg/dL    Comment: Glucose reference range applies only to samples taken after fasting for at least 8 hours.  CBG monitoring, ED     Status: Abnormal   Collection Time: 07/31/22  8:03 AM  Result Value Ref Range   Glucose-Capillary 108 (H) 70 - 99 mg/dL    Comment: Glucose reference range applies only to samples taken after fasting for at least 8 hours.   Comment 1 Notify  RN   CBG monitoring, ED     Status: Abnormal   Collection Time: 07/31/22 11:48 AM  Result Value Ref Range   Glucose-Capillary 112 (H) 70 - 99 mg/dL    Comment: Glucose reference range applies only to samples taken after fasting for at least 8 hours.   Comment 1 Notify RN   Lactic acid, plasma     Status: None   Collection Time: 07/31/22  2:45 PM  Result Value Ref Range   Lactic Acid, Venous 0.9 0.5 - 1.9 mmol/L    Comment: Performed at [facility], [address] [place], [place] [address]  Creatinine, serum     Status: Abnormal   Collection Time: 07/31/22  2:45 PM  Result Value Ref Range   Creatinine, Ser 1.40  (H) 0.61 - 1.24 mg/dL   GFR, Estimated 57 (L) >60 mL/min    Comment: (NOTE) Calculated using the CKD-EPI Creatinine Equation (2021) Performed at [facility], [address] [place], [place] [address]   CBG monitoring, ED     Status: Abnormal   Collection Time: 07/31/22  4:29 PM  Result Value Ref Range   Glucose-Capillary 117 (H) 70 - 99 mg/dL    Comment: Glucose reference range applies only to samples taken after fasting for at least 8 hours.   Comment 1 Notify RN   Glucose, capillary     Status: Abnormal   Collection Time: 07/31/22  5:25 PM  Result Value Ref Range   Glucose-Capillary 108 (H) 70 - 99 mg/dL    Comment: Glucose reference range applies only to samples taken after fasting for at least 8 hours.    MR FOOT LEFT WO CONTRAST  Result Date: 07/31/2022 CLINICAL DATA:  Diabetic foot wound. EXAM: MRI OF THE LEFT FOOT WITHOUT CONTRAST TECHNIQUE: Multiplanar, multisequence MR imaging of the left foot was performed. No intravenous contrast was administered. COMPARISON:  Prior MRI 06/04/2022 and x-rays from yesterday. FINDINGS: Significant progression of septic arthritis at the second MTP joint with extensive osteomyelitis involving the second metatarsal head and neck and also the proximal phalanx. Surrounding fluid collection consistent with an abscess extending back along the second metatarsal. Septic arthritis at the PIP joint of the fourth toe with extensive osteomyelitis involving the proximal and middle phalanges. No septic arthritis at the fourth MTP joint. Diffuse cellulitis and extensive myofasciitis most notably involving the plantar foot musculature. I do not see a discrete fluid collection but could not exclude pyomyositis without contrast. The visualized midfoot bony structures are intact. IMPRESSION: 1. Significant progression of septic arthritis at the second MTP joint with extensive osteomyelitis involving the second metatarsal head and neck and also the proximal  phalanx. Surrounding fluid collection consistent with an abscess extending back along the second metatarsal. 2. Septic arthritis at the PIP joint of the fourth toe with extensive osteomyelitis involving the proximal and middle phalanges. 3. Diffuse cellulitis and extensive myofasciitis most notably involving the plantar foot musculature. I do not see a discrete fluid collection but could not exclude pyomyositis without contrast. Electronically Signed   By: Marijo Sanes M.D.   On: 07/31/2022 11:50   DG Foot Complete Left  Result Date: 07/30/2022 CLINICAL DATA:  Diabetic foot infection EXAM: LEFT FOOT - COMPLETE 3+ VIEW COMPARISON:  06/02/2022 FINDINGS: No acute fracture or dislocation. Prior resection of the second toe distal phalanx. There is periarticular osteopenia of the fourth toe middle phalanx adjacent to the PIP joint. There is also osteopenia of the second metatarsal head  and base of the second toe proximal phalanx. Possible developing erosion along the medial aspect of the first metatarsal head. Generalized soft tissue swelling. Atherosclerotic vascular calcifications. No soft tissue gas. IMPRESSION: 1. Periarticular osteopenia of the fourth toe middle phalanx, as well as the second metatarsal head and base of the second toe proximal phalanx. Developing osteomyelitis is a concern given the clinical history. Consider MRI for further evaluation. 2. Possible developing erosion along the medial aspect of the first metatarsal head. Electronically Signed   By: Davina Poke D.O.   On: 07/30/2022 15:19    Review of Systems  Constitutional:  Negative for chills and fever.  HENT:  Negative for sinus pain and sore throat.   Respiratory:  Negative for cough and shortness of breath.   Cardiovascular:  Negative for chest pain and palpitations.  Gastrointestinal:  Negative for nausea and vomiting.  Endocrine: Negative for polydipsia and polyuria.  Genitourinary:  Negative for frequency and urgency.   Musculoskeletal:  Negative for arthralgias and myalgias.  Skin:        Patient relates chronic issues with draining wound on the left foot with fairly recent surgery as well as a Brown recent ulcer on his left fourth toe.  Recent increased swelling and redness.  Neurological:        Patient relates significant neuropathy associated with his diabetes.  Psychiatric/Behavioral:  Negative for confusion. The patient is not nervous/anxious.    Blood pressure 128/73, pulse 75, temperature 98.1 F (36.7 C), resp. rate 18, height '5\' 8"'$  (1.727 m), weight 94.8 kg, SpO2 100 %. Physical Exam Cardiovascular:     Comments: DP pulses palpable bilateral with diminished PT pulses. Musculoskeletal:     Comments: Adequate range of motion of the pedal joints.  Muscle testing deferred.  Skin:    Comments: Erythema and edema noted in the left foot.  Large full-thickness ulceration noted in the left first interspace probing down to the level of bone at the second metatarsal head.  Full-thickness necrotic appearing ulceration noted on the lateral aspect of the fourth toe.  Neurological:     Comments: Loss of protective threshold sensation in the feet bilateral.        Assessment/Plan: Assessment: 1.  Osteomyelitis left second toe and metatarsal and fourth toe. 2.  Full-thickness ulcerations left first interspace and fourth toe. 3.  Diabetes with associated neuropathy. 4.  Peripheral vascular disease.  Plan: Discussed with the patient as previously discussed in the office that at a minimum he will need amputations for the infected bone but that most likely he could end up needing a transmetatarsal amputation.  Patient states that his last dose of Plavix was yesterday.  Discussed that we will have to allow this to clear his system and most likely we will have to wait until at least Sunday or probably Monday to consider amputation.  Betadine gauze applied to the wounds followed by a bulky bandage.  Continue to  follow with the patient over the weekend and most likely plan for surgery on Monday  Ricardo Brown 07/31/2022, 6:08 PM

## 2022-08-01 DIAGNOSIS — N179 Acute kidney failure, unspecified: Secondary | ICD-10-CM | POA: Diagnosis not present

## 2022-08-01 DIAGNOSIS — I739 Peripheral vascular disease, unspecified: Secondary | ICD-10-CM

## 2022-08-01 DIAGNOSIS — N182 Chronic kidney disease, stage 2 (mild): Secondary | ICD-10-CM

## 2022-08-01 DIAGNOSIS — M86172 Other acute osteomyelitis, left ankle and foot: Secondary | ICD-10-CM | POA: Diagnosis not present

## 2022-08-01 DIAGNOSIS — I5042 Chronic combined systolic (congestive) and diastolic (congestive) heart failure: Secondary | ICD-10-CM | POA: Diagnosis not present

## 2022-08-01 LAB — PROCALCITONIN: Procalcitonin: <0.1 ng/mL

## 2022-08-01 LAB — GLUCOSE, CAPILLARY
Glucose-Capillary: 113 mg/dL — ABNORMAL HIGH (ref 70–99)
Glucose-Capillary: 173 mg/dL — ABNORMAL HIGH (ref 70–99)
Glucose-Capillary: 119 mg/dL — ABNORMAL HIGH (ref 70–99)
Glucose-Capillary: 164 mg/dL — ABNORMAL HIGH (ref 70–99)

## 2022-08-01 LAB — BASIC METABOLIC PANEL
Anion gap: 6 (ref 5–15)
BUN: 22 mg/dL (ref 8–23)
CO2: 25 mmol/L (ref 22–32)
Calcium: 8.6 mg/dL — ABNORMAL LOW (ref 8.9–10.3)
Chloride: 104 mmol/L (ref 98–111)
Creatinine, Ser: 1.29 mg/dL — ABNORMAL HIGH (ref 0.61–1.24)
GFR, Estimated: >60 mL/min (ref >60)
Glucose, Bld: 172 mg/dL — ABNORMAL HIGH (ref 70–99)
Potassium: 3.9 mmol/L (ref 3.5–5.1)
Sodium: 135 mmol/L (ref 135–145)

## 2022-08-01 NOTE — Progress Notes (Signed)
[facility]                                                                               Ricardo Brown, is a 61 y.o. male, DOB - 18-Feb-1961, GYK:[id number] Admit date - 07/30/2022    Outpatient Primary MD for the patient is [name], MD  LOS - 2  days    Brief summary   61 year old male with past medical history of diabetes mellitus, poorly controlled, CAD status post CABG, PAD, chronic systolic/diastolic heart failure and obesity who was hospitalized 2 months ago for abscess of the first second interspace of foot and osteomyelitis of second MTP joint status post I&D who was sent to the emergency room on 12/28 for concerns of recurrent osteomyelitis.  Admitted to the hospitalist service.  MRI done noted osteomyelitis in the left fourth toe as well as second toe and metatarsal.  Started on IV antibiotics and podiatry plans to take patient to the OR on 1/1 for transmetatarsal amputation.   Assessment & Plan    Assessment and Plan: * Osteomyelitis of left foot (HCC) Recurrent.  For transmetatarsal amputation 1/1.  In the interim, continue antibiotics.   Acute renal failure superimposed on stage 2 chronic kidney disease (HCC)-resolved Given IV fluids.  Creatinine today at 1.29.  Chronic combined systolic (congestive) and diastolic (congestive) heart failure ([place]) -2D echo 5/23 had shown EF of 45 to 50% Continue losartan.  Check BMP in the morning.  Type 2 diabetes mellitus with diabetic chronic kidney disease (HCC) CBG (last 3)  Recent Labs    08/01/22 0746 08/01/22 1150 08/01/22 1642  GLUCAP 113* 173* 119*    Resume SSI.    PAD (peripheral artery disease) ([place]) History of left lower extremity angiogram on 11/6 Will hold Plavix given upcoming procedure.   Continue rosuvastatin  Hypertension BP stable,well controlled.   continue metoprolol, and losartan  Coronary artery disease No complaints of chest pain, EKG nonacute Continue  rosuvastatin, aspirin and metoprolol.   Holding Plavix for procedure  Obesity: Patient meets criteria with BMI greater than 30 Estimated body mass index is 31.78 kg/m as calculated from the following:   Height as of this encounter: '5\' 8"'$  (1.727 m).   Weight as of this encounter: 94.8 kg.  Code Status: full code.  DVT Prophylaxis:  SCDs Start: 07/30/22 1940   Level of Care: Level of care: Med-Surg Family Communication: Wife at bedside  Disposition Plan:     Remains inpatient appropriate:   -Surgery on Monday  Procedures:  Planned metatarsal amputation  Consultants:   PODIATRY.   Antimicrobials:   Anti-infectives (From admission, onward)    Start     Dose/Rate Route Frequency Ordered Stop   08/01/22 1300  vancomycin (VANCOREADY) IVPB 1250 mg/250 mL        1,250 mg 166.7 mL/hr over 90 Minutes Intravenous Every 24 hours 07/31/22 1618     07/31/22 2200  piperacillin-tazobactam (ZOSYN) IVPB 3.375 g        3.375 g 12.5 mL/hr over 240 Minutes Intravenous Every 8 hours 07/31/22 1445     07/31/22 1100  piperacillin-tazobactam (ZOSYN) IVPB 3.375 g  3.375 g 100 mL/hr over 30 Minutes Intravenous  Once 07/31/22 1046 07/31/22 1641   07/31/22 1100  vancomycin (VANCOREADY) IVPB 1500 mg/300 mL        1,500 mg 150 mL/hr over 120 Minutes Intravenous  Once 07/31/22 1056 07/31/22 1641   07/30/22 1745  vancomycin (VANCOCIN) IVPB 1000 mg/200 mL premix        1,000 mg 200 mL/hr over 60 Minutes Intravenous  Once 07/30/22 1743 07/30/22 2054   07/30/22 1745  piperacillin-tazobactam (ZOSYN) IVPB 3.375 g        3.375 g 100 mL/hr over 30 Minutes Intravenous  Once 07/30/22 1743 07/30/22 2028        Medications  Scheduled Meds:  clopidogrel  75 mg Oral Daily   gabapentin  100 mg Oral BID   insulin aspart  0-15 Units Subcutaneous TID WC   insulin aspart  0-5 Units Subcutaneous QHS   losartan  25 mg Oral Daily   metoprolol succinate  50 mg Oral Daily   pramipexole  1.5 mg Oral BID    rosuvastatin  20 mg Oral Daily   Continuous Infusions:  piperacillin-tazobactam (ZOSYN)  IV 3.375 g (08/01/22 1550)   vancomycin 1,250 mg (08/01/22 1258)   PRN Meds:.acetaminophen **OR** acetaminophen, HYDROcodone-acetaminophen, morphine injection, ondansetron **OR** ondansetron (ZOFRAN) IV    Subjective:   Patient with no complaints  Objective:   Vitals:   07/31/22 1709 08/01/22 0400 08/01/22 0745 08/01/22 1515  BP: 128/73 112/67 124/64 113/65  Pulse: 75 70 70 63  Resp: '18 16 17 17  '$ Temp: 98.1 F (36.7 C) 98 F (36.7 C) 97.7 F (36.5 C) 98.6 F (37 C)  TempSrc:  Oral    SpO2: 100% 97% 98% 97%  Weight:      Height:        Intake/Output Summary (Last 24 hours) at 08/01/2022 1657 Last data filed at 08/01/2022 1644 Gross per 24 hour  Intake 530 ml  Output 3075 ml  Net -2545 ml    Filed Weights   07/30/22 1427  Weight: 94.8 kg     Exam General: Alert and oriented x 3, NAD Left foot wrapped, in boot   Data Reviewed:  I have personally reviewed following labs and imaging studies Creatinine of 1.29.   Radiology Studies: MR FOOT LEFT WO CONTRAST  Result Date: 07/31/2022 CLINICAL DATA:  Diabetic foot wound. EXAM: MRI OF THE LEFT FOOT WITHOUT CONTRAST TECHNIQUE: Multiplanar, multisequence MR imaging of the left foot was performed. No intravenous contrast was administered. COMPARISON:  Prior MRI 06/04/2022 and x-rays from yesterday. FINDINGS: Significant progression of septic arthritis at the second MTP joint with extensive osteomyelitis involving the second metatarsal head and neck and also the proximal phalanx. Surrounding fluid collection consistent with an abscess extending back along the second metatarsal. Septic arthritis at the PIP joint of the fourth toe with extensive osteomyelitis involving the proximal and middle phalanges. No septic arthritis at the fourth MTP joint. Diffuse cellulitis and extensive myofasciitis most notably involving the plantar foot  musculature. I do not see a discrete fluid collection but could not exclude pyomyositis without contrast. The visualized midfoot bony structures are intact. IMPRESSION: 1. Significant progression of septic arthritis at the second MTP joint with extensive osteomyelitis involving the second metatarsal head and neck and also the proximal phalanx. Surrounding fluid collection consistent with an abscess extending back along the second metatarsal. 2. Septic arthritis at the PIP joint of the fourth toe with extensive osteomyelitis involving the proximal and middle  phalanges. 3. Diffuse cellulitis and extensive myofasciitis most notably involving the plantar foot musculature. I do not see a discrete fluid collection but could not exclude pyomyositis without contrast. Electronically Signed   By: Marijo Sanes M.D.   On: 07/31/2022 11:50       Annita Brod M.D. [facility] 08/01/2022, 4:57 PM  Available via Epic secure chat 7am-7pm After 7 pm, please refer to night coverage provider listed on amion.

## 2022-08-01 NOTE — Plan of Care (Signed)

## 2022-08-01 NOTE — Progress Notes (Signed)
Subjective/Chief Complaint: Patient seen.  No specific complaints.   Objective: Vital signs in last 24 hours: Temp:  [97.7 F (36.5 C)-98.8 F (37.1 C)] 97.7 F (36.5 C) (12/30 0745) Pulse Rate:  [70-85] 70 (12/30 0745) Resp:  [16-20] 17 (12/30 0745) BP: (112-128)/(64-80) 124/64 (12/30 0745) SpO2:  [95 %-100 %] 98 % (12/30 0745) Last BM Date : 07/29/22  Intake/Output from previous day: 12/29 0701 - 12/30 0700 In: 290 [P.O.:240; IV Piggyback:50] Out: 1425 [Urine:1425] Intake/Output this shift: Total I/O In: -  Out: 300 [Urine:300]  Some mild drainage is noted on the bandaging.  Some erythema in the forefoot but edema has slightly improved.  MRI showed osteomyelitis in the left fourth toe as well as the second toe and metatarsal.  Lab Results:  Recent Labs    07/30/22 1435  WBC 9.4  HGB 12.0*  HCT 37.9*  PLT 302   BMET Recent Labs    07/30/22 1435 07/31/22 1445 08/01/22 0930  NA 136  --  135  K 4.3  --  3.9  CL 100  --  104  CO2 21*  --  25  GLUCOSE 193*  --  172*  BUN 41*  --  22  CREATININE 1.57* 1.40* 1.29*  CALCIUM 9.2  --  8.6*   PT/INR No results for input(s): "LABPROT", "INR" in the last 72 hours. ABG No results for input(s): "PHART", "HCO3" in the last 72 hours.  Invalid input(s): "PCO2", "PO2"  Studies/Results: MR FOOT LEFT WO CONTRAST  Result Date: 07/31/2022 CLINICAL DATA:  Diabetic foot wound. EXAM: MRI OF THE LEFT FOOT WITHOUT CONTRAST TECHNIQUE: Multiplanar, multisequence MR imaging of the left foot was performed. No intravenous contrast was administered. COMPARISON:  Prior MRI 06/04/2022 and x-rays from yesterday. FINDINGS: Significant progression of septic arthritis at the second MTP joint with extensive osteomyelitis involving the second metatarsal head and neck and also the proximal phalanx. Surrounding fluid collection consistent with an abscess extending back along the second metatarsal. Septic arthritis at the PIP joint of the  fourth toe with extensive osteomyelitis involving the proximal and middle phalanges. No septic arthritis at the fourth MTP joint. Diffuse cellulitis and extensive myofasciitis most notably involving the plantar foot musculature. I do not see a discrete fluid collection but could not exclude pyomyositis without contrast. The visualized midfoot bony structures are intact. IMPRESSION: 1. Significant progression of septic arthritis at the second MTP joint with extensive osteomyelitis involving the second metatarsal head and neck and also the proximal phalanx. Surrounding fluid collection consistent with an abscess extending back along the second metatarsal. 2. Septic arthritis at the PIP joint of the fourth toe with extensive osteomyelitis involving the proximal and middle phalanges. 3. Diffuse cellulitis and extensive myofasciitis most notably involving the plantar foot musculature. I do not see a discrete fluid collection but could not exclude pyomyositis without contrast. Electronically Signed   By: Marijo Sanes M.D.   On: 07/31/2022 11:50   DG Foot Complete Left  Result Date: 07/30/2022 CLINICAL DATA:  Diabetic foot infection EXAM: LEFT FOOT - COMPLETE 3+ VIEW COMPARISON:  06/02/2022 FINDINGS: No acute fracture or dislocation. Prior resection of the second toe distal phalanx. There is periarticular osteopenia of the fourth toe middle phalanx adjacent to the PIP joint. There is also osteopenia of the second metatarsal head and base of the second toe proximal phalanx. Possible developing erosion along the medial aspect of the first metatarsal head. Generalized soft tissue swelling. Atherosclerotic vascular calcifications. No soft tissue  gas. IMPRESSION: 1. Periarticular osteopenia of the fourth toe middle phalanx, as well as the second metatarsal head and base of the second toe proximal phalanx. Developing osteomyelitis is a concern given the clinical history. Consider MRI for further evaluation. 2. Possible  developing erosion along the medial aspect of the first metatarsal head. Electronically Signed   By: Davina Poke D.O.   On: 07/30/2022 15:19    Anti-infectives: Anti-infectives (From admission, onward)    Start     Dose/Rate Route Frequency Ordered Stop   08/01/22 1300  vancomycin (VANCOREADY) IVPB 1250 mg/250 mL        1,250 mg 166.7 mL/hr over 90 Minutes Intravenous Every 24 hours 07/31/22 1618     07/31/22 2200  piperacillin-tazobactam (ZOSYN) IVPB 3.375 g        3.375 g 12.5 mL/hr over 240 Minutes Intravenous Every 8 hours 07/31/22 1445     07/31/22 1100  piperacillin-tazobactam (ZOSYN) IVPB 3.375 g        3.375 g 100 mL/hr over 30 Minutes Intravenous  Once 07/31/22 1046 07/31/22 1641   07/31/22 1100  vancomycin (VANCOREADY) IVPB 1500 mg/300 mL        1,500 mg 150 mL/hr over 120 Minutes Intravenous  Once 07/31/22 1056 07/31/22 1641   07/30/22 1745  vancomycin (VANCOCIN) IVPB 1000 mg/200 mL premix        1,000 mg 200 mL/hr over 60 Minutes Intravenous  Once 07/30/22 1743 07/30/22 2054   07/30/22 1745  piperacillin-tazobactam (ZOSYN) IVPB 3.375 g        3.375 g 100 mL/hr over 30 Minutes Intravenous  Once 07/30/22 1743 07/30/22 2028       Assessment/Plan: s/p * No surgery found * Assessment: Osteomyelitis left foot.  Plan: Betadine gauze reapplied to the wounds on the left foot followed by a Kerlix bandage.  Discussed with the patient surgical options for the left foot including just trying to amputate the infected toes and metatarsal.  Discussed that most likely this would leave him with an open wound and still at significant risk for progressive infection, especially with the great toe.  Discussed transmetatarsal amputation which I think would be a more definitive procedure and give him his best chance for healing.  Patient decides that he would like to go ahead and proceed with transmetatarsal amputation.  Discussed possible risks and complications of the procedure including  but not limited to inability of the wound to heal due to his diabetes, vascular status, or continued infection.  No guarantees could be given as to the outcome.  Questions invited and answered.  At this point we will plan for surgery for transmetatarsal amputation on Monday.  LOS: 2 days    [name] 08/01/2022

## 2022-08-01 NOTE — Progress Notes (Signed)
Plavix still ordered in [facility]. Last dose was given to patient on 12/29 at 0858. Dr. Cleda Mccreedy made aware. RN instructed to discontinue  Plavix.

## 2022-08-01 NOTE — Consult Note (Signed)
Pharmacy Antibiotic Note  Ricardo Brown is a 61 y.o. male admitted on 07/30/2022 with  osteomyelitis of left foot .  Pharmacy has been consulted for vancomycin and Zosyn dosing.  12/28: Patient got Zosyn 3.375 g @ 1858, vancomycin 1 gram @ 1925  Plan: Continue zosyn 3.375 grams IV every 8 hours Pt received vancomycin 1500 mg x 1 followed by 1250 mg q24H. Will adjust dose to 750 mg IV BID. Predicted AUC 417. Goal AUC 400-550. Scr 1.29 (improving), Vd 0.72 (borderline), IBW for calc. Plan to order vancomycin level after 4th or 5th dose.     Height: '5\' 8"'$  (172.7 cm) Weight: 94.8 kg (209 lb) IBW/kg (Calculated) : 68.4  Temp (24hrs), Avg:98.3 F (36.8 C), Min:97.7 F (36.5 C), Max:98.8 F (37.1 C)  Recent Labs  Lab 07/30/22 1435 07/31/22 1445 08/01/22 0930  WBC 9.4  --   --   CREATININE 1.57* 1.40* 1.29*  LATICACIDVEN 2.1* 0.9  --      Estimated Creatinine Clearance: 67.2 mL/min (A) (by C-G formula based on SCr of 1.29 mg/dL (H)).    Allergies  Allergen Reactions   Lipitor [Atorvastatin] Palpitations    SVT   Sacubitril-Valsartan Other (See Comments)    SVT Other reaction(s): Other (see comments) Other reaction(s): Other (See Comments) SVT SVT   Pregabalin Palpitations    SVT Other reaction(s): Other (see comments) SVT SVT    Antimicrobials this admission: Vancomycin 12/28 >>  Zosyn 12/28 >>   Dose adjustments this admission: N/A  Microbiology results: 12/28 BCx: ngtd  Thank you for allowing pharmacy to be a part of this patient's care.  Oswald Hillock 08/01/2022 11:41 AM

## 2022-08-02 DIAGNOSIS — M86172 Other acute osteomyelitis, left ankle and foot: Secondary | ICD-10-CM | POA: Diagnosis not present

## 2022-08-02 DIAGNOSIS — N179 Acute kidney failure, unspecified: Secondary | ICD-10-CM | POA: Diagnosis not present

## 2022-08-02 DIAGNOSIS — I739 Peripheral vascular disease, unspecified: Secondary | ICD-10-CM | POA: Diagnosis not present

## 2022-08-02 DIAGNOSIS — I5042 Chronic combined systolic (congestive) and diastolic (congestive) heart failure: Secondary | ICD-10-CM | POA: Diagnosis not present

## 2022-08-02 LAB — GLUCOSE, CAPILLARY
Glucose-Capillary: 145 mg/dL — ABNORMAL HIGH (ref 70–99)
Glucose-Capillary: 183 mg/dL — ABNORMAL HIGH (ref 70–99)
Glucose-Capillary: 255 mg/dL — ABNORMAL HIGH (ref 70–99)
Glucose-Capillary: 178 mg/dL — ABNORMAL HIGH (ref 70–99)

## 2022-08-02 LAB — BASIC METABOLIC PANEL
Anion gap: 9 (ref 5–15)
BUN: 25 mg/dL — ABNORMAL HIGH (ref 8–23)
CO2: 25 mmol/L (ref 22–32)
Calcium: 8.2 mg/dL — ABNORMAL LOW (ref 8.9–10.3)
Chloride: 103 mmol/L (ref 98–111)
Creatinine, Ser: 1.51 mg/dL — ABNORMAL HIGH (ref 0.61–1.24)
GFR, Estimated: 52 mL/min — ABNORMAL LOW (ref >60)
Glucose, Bld: 169 mg/dL — ABNORMAL HIGH (ref 70–99)
Potassium: 3.4 mmol/L — ABNORMAL LOW (ref 3.5–5.1)
Sodium: 137 mmol/L (ref 135–145)

## 2022-08-02 LAB — BRAIN NATRIURETIC PEPTIDE: B Natriuretic Peptide: 105.4 pg/mL — ABNORMAL HIGH (ref 0.0–100.0)

## 2022-08-02 MED ORDER — SODIUM CHLORIDE 0.9 % IV SOLN: INTRAVENOUS | Status: DC

## 2022-08-02 MED ORDER — VANCOMYCIN HCL 750 MG/150ML IV SOLN
750.0000 mg | Freq: Two times a day (BID) | INTRAVENOUS | Status: DC
  Administered 2022-08-02 – 2022-08-04 (×4): 750 mg via INTRAVENOUS
  Filled 2022-08-02 (×6): qty 150

## 2022-08-02 NOTE — Progress Notes (Signed)
[facility]                                                                               Ricardo Brown, is a 61 y.o. male, DOB - [birth year], ZOX:[id number] Admit date - 07/30/2022    Outpatient Primary MD for the patient is [name], MD  LOS - 3  days    Brief summary   61 year old male with past medical history of diabetes mellitus, poorly controlled, CAD status post CABG, PAD, chronic systolic/diastolic heart failure and obesity who was hospitalized 2 months ago for abscess of the first second interspace of foot and osteomyelitis of second MTP joint status post I&D who was sent to the emergency room on 12/28 for concerns of recurrent osteomyelitis.  Admitted to the hospitalist service.  MRI done noted osteomyelitis in the left fourth toe as well as second toe and metatarsal.  Started on IV antibiotics and podiatry plans to take patient to the OR on 1/2 for transmetatarsal amputation.   Assessment & Plan    Assessment and Plan: * Osteomyelitis of left foot (HCC) Recurrent.  For transmetatarsal amputation 1/2.  In the interim, continue antibiotics.   Acute renal failure superimposed on stage 2 chronic kidney disease (HCC)-resolved Given IV fluids.  Creatinine trending back upward.  Have started some gentle IV fluids.  Chronic combined systolic (congestive) and diastolic (congestive) heart failure ([place]) -2D echo 5/23 had shown EF of 45 to 50% Continue losartan.  BNP essentially unremarkable.  Type 2 diabetes mellitus with diabetic chronic kidney disease ([place]) CBG (last 3)  Recent Labs    08/01/22 2109 08/02/22 0822 08/02/22 1124  GLUCAP 164* 145* 183*    Resume SSI.    PAD (peripheral artery disease) ([place]) History of left lower extremity angiogram on 11/6 Plavix last given on Friday, 12/29. Continue rosuvastatin  Hypertension BP stable,well controlled.   continue metoprolol, and losartan  Coronary artery disease No complaints of chest  pain, EKG nonacute Continue rosuvastatin, aspirin and metoprolol.   Holding Plavix for procedure  Obesity: Patient meets criteria with BMI greater than 30 Estimated body mass index is 31.78 kg/m as calculated from the following:   Height as of this encounter: '5\' 8"'$  (1.727 m).   Weight as of this encounter: 94.8 kg.  Code Status: full code.  DVT Prophylaxis:  SCDs Start: 07/30/22 1940   Level of Care: Level of care: Med-Surg Family Communication: Wife at bedside  Disposition Plan:     Remains inpatient appropriate:   -Surgery Tuesday  Procedures:  Planned metatarsal amputation  Consultants:   PODIATRY.   Antimicrobials:   Anti-infectives (From admission, onward)    Start     Dose/Rate Route Frequency Ordered Stop   08/02/22 1400  vancomycin (VANCOREADY) IVPB 750 mg/150 mL        750 mg 150 mL/hr over 60 Minutes Intravenous Every 12 hours 08/02/22 1234     08/01/22 1300  vancomycin (VANCOREADY) IVPB 1250 mg/250 mL  Status:  Discontinued        1,250 mg 166.7 mL/hr over 90 Minutes Intravenous Every 24 hours 07/31/22 1618 08/02/22 1234   07/31/22 2200  piperacillin-tazobactam (ZOSYN) IVPB 3.375 g  3.375 g 12.5 mL/hr over 240 Minutes Intravenous Every 8 hours 07/31/22 1445     07/31/22 1100  piperacillin-tazobactam (ZOSYN) IVPB 3.375 g        3.375 g 100 mL/hr over 30 Minutes Intravenous  Once 07/31/22 1046 07/31/22 1641   07/31/22 1100  vancomycin (VANCOREADY) IVPB 1500 mg/300 mL        1,500 mg 150 mL/hr over 120 Minutes Intravenous  Once 07/31/22 1056 07/31/22 1641   07/30/22 1745  vancomycin (VANCOCIN) IVPB 1000 mg/200 mL premix        1,000 mg 200 mL/hr over 60 Minutes Intravenous  Once 07/30/22 1743 07/30/22 2054   07/30/22 1745  piperacillin-tazobactam (ZOSYN) IVPB 3.375 g        3.375 g 100 mL/hr over 30 Minutes Intravenous  Once 07/30/22 1743 07/30/22 2028        Medications  Scheduled Meds:  gabapentin  100 mg Oral BID   insulin aspart  0-15  Units Subcutaneous TID WC   insulin aspart  0-5 Units Subcutaneous QHS   losartan  25 mg Oral Daily   metoprolol succinate  50 mg Oral Daily   pramipexole  1.5 mg Oral BID   rosuvastatin  20 mg Oral Daily   Continuous Infusions:  sodium chloride 100 mL/hr at 08/02/22 0943   piperacillin-tazobactam (ZOSYN)  IV 3.375 g (08/02/22 0711)   vancomycin     PRN Meds:.acetaminophen **OR** acetaminophen, HYDROcodone-acetaminophen, morphine injection, ondansetron **OR** ondansetron (ZOFRAN) IV    Subjective:   Patient with no complaints  Objective:   Vitals:   08/01/22 0745 08/01/22 1515 08/02/22 0042 08/02/22 0740  BP: 124/64 113/65 110/60 (!) 107/57  Pulse: 70 63 73 72  Resp: '17 17 18 17  '$ Temp: 97.7 F (36.5 C) 98.6 F (37 C) 98.2 F (36.8 C) 98.3 F (36.8 C)  TempSrc:      SpO2: 98% 97% 95% 98%  Weight:      Height:        Intake/Output Summary (Last 24 hours) at 08/02/2022 1340 Last data filed at 08/02/2022 0600 Gross per 24 hour  Intake 390 ml  Output 1350 ml  Net -960 ml    Filed Weights   07/30/22 1427  Weight: 94.8 kg     Exam General: Alert and oriented x 3, NAD Left foot wrapped, in boot   Data Reviewed:  I have personally reviewed following labs and imaging studies Creatinine at 1.54   Radiology Studies: No results found.     Annita Brod M.D. [facility] 08/02/2022, 1:40 PM  Available via Epic secure chat 7am-7pm After 7 pm, please refer to night coverage provider listed on amion.

## 2022-08-02 NOTE — TOC Initial Note (Signed)
Transition of Care [facility]) - Initial/Assessment Note    Patient Details  Name: Ricardo Brown MRN: [id number] Date of Birth: [birth year]  Transition of Care [facility]) CM/SW Contact:    Magnus Ivan, LCSW Phone Number: 08/02/2022, 10:36 AM  Clinical Narrative:                 Completed assessment with patient. Patient lives with his spouse and drives himself to appointments. PCP is Dr. Lavera Guise. Pharmacy is [facility].  No HH history. Patient has a RW at home he can use if needed. TOC to follow for needs.   Expected Discharge Plan: Home/Self Care Barriers to Discharge: Continued Medical Work up   Patient Goals and CMS Choice   CMS Medicare.gov Compare Post Acute Care list provided to:: Patient Choice offered to / list presented to : Patient      Expected Discharge Plan and Services       Living arrangements for the past 2 months: Single Family Home                                      Prior Living Arrangements/Services Living arrangements for the past 2 months: Single Family Home Lives with:: Spouse Patient language and need for interpreter reviewed:: Yes Do you feel safe going back to the place where you live?: Yes      Need for Family Participation in Patient Care: Yes (Comment) Care giver support system in place?: Yes (comment) Current home services: DME Criminal Activity/Legal Involvement Pertinent to Current Situation/Hospitalization: No - Comment as needed  Activities of Daily Living Home Assistive Devices/Equipment: Dentures (specify type), Eyeglasses ADL Screening (condition at time of admission) Patient's cognitive ability adequate to safely complete daily activities?: Yes Is the patient deaf or have difficulty hearing?: Yes (background noise is a problem) Does the patient have difficulty seeing, even when wearing glasses/contacts?: Yes (scheduled for cataract surgery in the near future; has retinopathy) Does the patient have  difficulty concentrating, remembering, or making decisions?: Yes (remembering, mainly short-term) Patient able to express need for assistance with ADLs?: Yes Does the patient have difficulty dressing or bathing?: No Independently performs ADLs?: Yes (appropriate for developmental age) Does the patient have difficulty walking or climbing stairs?: Yes Weakness of Legs: None (does have neuropathy in feet) Weakness of Arms/Hands: None (does have neuropathy in fingertips)  Permission Sought/Granted Permission sought to share information with : Facility Art therapist granted to share information with : Yes, Verbal Permission Granted     Permission granted to share info w AGENCY: as needed        Emotional Assessment       Orientation: : Oriented to Self, Oriented to Place, Oriented to Situation, Oriented to  Time Alcohol / Substance Use: Not Applicable Psych Involvement: No (comment)  Admission diagnosis:  Osteomyelitis ([place]) [M86.9] Diabetic foot infection ([place]) [T55.732, L08.9] Osteomyelitis of left foot, unspecified type ([place]) [M86.9] Patient Active Problem List   Diagnosis Date Noted   Osteomyelitis of left foot ([employer]) 07/30/2022   Neck stiffness 06/22/2022   Diabetic foot infection ([place]) 05/27/2022   Anemia 05/27/2022   Morbid obesity ([place]) 12/19/2021   Cellulitis of left lower extremity 12/19/2021   Acute renal failure superimposed on stage 2 chronic kidney disease ([place]) 12/19/2021   Stage 3a chronic kidney disease (CKD) ([place]) 12/19/2021   Dermatitis 12/04/2021   Overweight 10/23/2021   Acquired absence  of other left toe(s) ([place]) 09/25/2021   Congestive heart failure ([place]) 09/25/2021   Edema, unspecified 09/25/2021   Osteomyelitis, unspecified ([place]) 09/25/2021   Personal history of surgery to heart and great vessels, presenting hazards to health 09/25/2021   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 08/28/2021   Chronic combined systolic (congestive) and diastolic  (congestive) heart failure ([place]) 08/28/2021   PAD (peripheral artery disease) ([place]) 08/28/2021   Restless leg syndrome 05/21/2021   History of colonic polyps    Lipoma of colon    History of stroke 11/13/2019   Obesity (BMI 30-39.9) 10/04/2019   Numbness 09/07/2019   Bilateral hand numbness 07/21/2019   Imbalance 06/27/2019   Neuropathy 06/27/2019   Screening for colon cancer    Polyp of colon    Coronary artery disease 05/31/2019   PSVT (paroxysmal supraventricular tachycardia) 03/22/2018   Type 2 diabetes mellitus with diabetic chronic kidney disease ([place]) 03/07/2018   HLD (hyperlipidemia) 03/07/2018   Hypertension 03/07/2018   Right ventricular dysfunction 02/02/2018   Postoperative atrial fibrillation ([place]) 02/01/2018   S/P CABG x 3 01/28/2018   PCP:  [name], MD Pharmacy:   [facility] [address], [place] - [place] [address] Phone: [phone] Fax: [place] [place] [address] [place] [address] Phone: [phone] Fax: [phone]     Social Determinants of Health (SDOH) Social History: [place]: No Food Insecurity (07/31/2022)  Recent Concern: Food Insecurity - Food Insecurity Present (06/02/2022)  Housing: Low Risk  (07/31/2022)  Transportation Needs: No Transportation Needs (07/31/2022)  Utilities: Not At Risk (07/31/2022)  Depression (PHQ2-9): Low Risk  (09/10/2021)  Tobacco Use: Low Risk  (07/31/2022)   SDOH Interventions: Food Insecurity Interventions: Intervention Not Indicated Transportation Interventions: Intervention Not Indicated Utilities Interventions: Intervention Not Indicated   Readmission Risk Interventions    08/02/2022   10:35 AM 05/29/2022   11:31 AM  Readmission Risk Prevention Plan  Transportation Screening Complete Complete  PCP or Specialist Appt within 5-7 Days  Complete  PCP or  Specialist Appt within 3-5 Days Complete   Medication Review (RN CM)  Complete  HRI or Home Care Consult Complete   Social Work Consult for [place] Planning/Counseling Complete   Palliative Care Screening Not Applicable   Medication Review Press photographer) Complete

## 2022-08-02 NOTE — Progress Notes (Signed)
   Subjective/Chief Complaint: Patient seen.  No complaints.  Messaged from his nurse last night concerning his Plavix.  Patient did receive his Plavix dose on Friday morning.   Objective: Vital signs in last 24 hours: Temp:  [98.2 F (36.8 C)-98.6 F (37 C)] 98.3 F (36.8 C) (12/31 0740) Pulse Rate:  [63-73] 72 (12/31 0740) Resp:  [17-18] 17 (12/31 0740) BP: (107-113)/(57-65) 107/57 (12/31 0740) SpO2:  [95 %-98 %] 98 % (12/31 0740) Last BM Date : 07/29/22  Intake/Output from previous day: 12/30 0701 - 12/31 0700 In: 390 [P.O.:240; IV Piggyback:150] Out: 2200 [Urine:2200] Intake/Output this shift: No intake/output data recorded.  Bandage on the left foot is dry and intact.  Not removed today.  Lab Results:  Recent Labs    07/30/22 1435  WBC 9.4  HGB 12.0*  HCT 37.9*  PLT 302   BMET Recent Labs    08/01/22 0930 08/02/22 0424  NA 135 137  K 3.9 3.4*  CL 104 103  CO2 25 25  GLUCOSE 172* 169*  BUN 22 25*  CREATININE 1.29* 1.51*  CALCIUM 8.6* 8.2*   PT/INR No results for input(s): "LABPROT", "INR" in the last 72 hours. ABG No results for input(s): "PHART", "HCO3" in the last 72 hours.  Invalid input(s): "PCO2", "PO2"  Studies/Results: No results found.  Anti-infectives: Anti-infectives (From admission, onward)    Start     Dose/Rate Route Frequency Ordered Stop   08/02/22 1400  vancomycin (VANCOREADY) IVPB 750 mg/150 mL        750 mg 150 mL/hr over 60 Minutes Intravenous Every 12 hours 08/02/22 1234     08/01/22 1300  vancomycin (VANCOREADY) IVPB 1250 mg/250 mL  Status:  Discontinued        1,250 mg 166.7 mL/hr over 90 Minutes Intravenous Every 24 hours 07/31/22 1618 08/02/22 1234   07/31/22 2200  piperacillin-tazobactam (ZOSYN) IVPB 3.375 g        3.375 g 12.5 mL/hr over 240 Minutes Intravenous Every 8 hours 07/31/22 1445     07/31/22 1100  piperacillin-tazobactam (ZOSYN) IVPB 3.375 g        3.375 g 100 mL/hr over 30 Minutes Intravenous  Once  07/31/22 1046 07/31/22 1641   07/31/22 1100  vancomycin (VANCOREADY) IVPB 1500 mg/300 mL        1,500 mg 150 mL/hr over 120 Minutes Intravenous  Once 07/31/22 1056 07/31/22 1641   07/30/22 1745  vancomycin (VANCOCIN) IVPB 1000 mg/200 mL premix        1,000 mg 200 mL/hr over 60 Minutes Intravenous  Once 07/30/22 1743 07/30/22 2054   07/30/22 1745  piperacillin-tazobactam (ZOSYN) IVPB 3.375 g        3.375 g 100 mL/hr over 30 Minutes Intravenous  Once 07/30/22 1743 07/30/22 2028       Assessment/Plan: s/p * No surgery found * Assessment: Osteomyelitis left foot.  Plan: Dressing left intact today.  Due to Plavix being received Friday morning at this point we will plan for surgery Tuesday afternoon.  I will follow-up with the patient tomorrow and plan for dressing change prior to surgery.  LOS: 3 days    [name] 08/02/2022

## 2022-08-02 NOTE — Plan of Care (Signed)

## 2022-08-03 LAB — BASIC METABOLIC PANEL
Anion gap: 7 (ref 5–15)
BUN: 20 mg/dL (ref 8–23)
CO2: 23 mmol/L (ref 22–32)
Calcium: 7.9 mg/dL — ABNORMAL LOW (ref 8.9–10.3)
Chloride: 109 mmol/L (ref 98–111)
Creatinine, Ser: 1.28 mg/dL — ABNORMAL HIGH (ref 0.61–1.24)
GFR, Estimated: >60 mL/min (ref >60)
Glucose, Bld: 146 mg/dL — ABNORMAL HIGH (ref 70–99)
Potassium: 3.6 mmol/L (ref 3.5–5.1)
Sodium: 139 mmol/L (ref 135–145)

## 2022-08-03 LAB — GLUCOSE, CAPILLARY
Glucose-Capillary: 143 mg/dL — ABNORMAL HIGH (ref 70–99)
Glucose-Capillary: 186 mg/dL — ABNORMAL HIGH (ref 70–99)
Glucose-Capillary: 194 mg/dL — ABNORMAL HIGH (ref 70–99)
Glucose-Capillary: 161 mg/dL — ABNORMAL HIGH (ref 70–99)

## 2022-08-03 LAB — SURGICAL PCR SCREEN
MRSA, PCR: NEGATIVE
Staphylococcus aureus: NEGATIVE

## 2022-08-03 MED ORDER — PRAMIPEXOLE DIHYDROCHLORIDE 1 MG PO TABS
1.5000 mg | ORAL_TABLET | Freq: Every day | ORAL | Status: DC
  Administered 2022-08-03 – 2022-08-10 (×8): 1.5 mg via ORAL
  Filled 2022-08-03 (×8): qty 2

## 2022-08-03 NOTE — Plan of Care (Signed)
  Problem: Clinical Measurements: Goal: Ability to maintain clinical measurements within normal limits will improve Outcome: Progressing   Problem: Coping: Goal: Level of anxiety will decrease Outcome: Progressing   Problem: Nutrition: Goal: Adequate nutrition will be maintained Outcome: Progressing   Problem: Activity: Goal: Risk for activity intolerance will decrease Outcome: Progressing   Problem: Pain Managment: Goal: General experience of comfort will improve Outcome: Progressing   Problem: Elimination: Goal: Will not experience complications related to urinary retention Outcome: Progressing   Problem: Skin Integrity: Goal: Skin integrity will improve Outcome: Progressing   Problem: Clinical Measurements: Goal: Ability to avoid or minimize complications of infection will improve Outcome: Progressing

## 2022-08-03 NOTE — Progress Notes (Signed)
[facility]                                                                               Ricardo Brown, is a 62 y.o. male, DOB - [birth year], WUG:[id number] Admit date - 07/30/2022    Outpatient Primary MD for the patient is [name], MD  LOS - 4  days    Brief summary   62 year old male with past medical history of diabetes mellitus, poorly controlled, CAD status post CABG, PAD, chronic systolic/diastolic heart failure and obesity who was hospitalized 2 months ago for abscess of the first second interspace of foot and osteomyelitis of second MTP joint status post I&D who was sent to the emergency room on 12/28 for concerns of recurrent osteomyelitis.  Admitted to the hospitalist service.  MRI done noted osteomyelitis in the left fourth toe as well as second toe and metatarsal.  Started on IV antibiotics and podiatry plans to take patient to the OR on 1/2 for transmetatarsal amputation.   Assessment & Plan    Assessment and Plan: * Osteomyelitis of left foot (HCC) Recurrent.  For transmetatarsal amputation 1/2.  In the interim, continue antibiotics.   Acute renal failure superimposed on stage 2 chronic kidney disease (HCC)-resolved Given IV fluids.  Creatinine improved with IV fluids.  Chronic combined systolic (congestive) and diastolic (congestive) heart failure ([place]) -2D echo 5/23 had shown EF of 45 to 50% Continue losartan.  BNP essentially unremarkable.  Type 2 diabetes mellitus with diabetic chronic kidney disease ([place]) CBG (last 3)  Recent Labs    08/02/22 2120 08/03/22 0806 08/03/22 1216  GLUCAP 178* 143* 186*    Resume SSI.    PAD (peripheral artery disease) ([place]) History of left lower extremity angiogram on 11/6 Plavix last given on Friday, 12/29. Continue rosuvastatin  Hypertension BP stable,well controlled.   continue metoprolol, and losartan  Coronary artery disease No complaints of chest pain, EKG nonacute Continue  rosuvastatin, aspirin and metoprolol.   Holding Plavix for procedure  Obesity: Patient meets criteria with BMI greater than 30 Estimated body mass index is 31.78 kg/m as calculated from the following:   Height as of this encounter: '5\' 8"'$  (1.727 m).   Weight as of this encounter: 94.8 kg.  Code Status: full code.  DVT Prophylaxis:  SCDs Start: 07/30/22 1940   Level of Care: Level of care: Med-Surg Family Communication: Wife at bedside  Disposition Plan:     Remains inpatient appropriate:   -Surgery Tuesday  Procedures:  Planned metatarsal amputation  Consultants:   PODIATRY.   Antimicrobials:   Anti-infectives (From admission, onward)    Start     Dose/Rate Route Frequency Ordered Stop   08/02/22 1400  vancomycin (VANCOREADY) IVPB 750 mg/150 mL        750 mg 150 mL/hr over 60 Minutes Intravenous Every 12 hours 08/02/22 1234     08/01/22 1300  vancomycin (VANCOREADY) IVPB 1250 mg/250 mL  Status:  Discontinued        1,250 mg 166.7 mL/hr over 90 Minutes Intravenous Every 24 hours 07/31/22 1618 08/02/22 1234   07/31/22 2200  piperacillin-tazobactam (ZOSYN) IVPB 3.375 g  3.375 g 12.5 mL/hr over 240 Minutes Intravenous Every 8 hours 07/31/22 1445     07/31/22 1100  piperacillin-tazobactam (ZOSYN) IVPB 3.375 g        3.375 g 100 mL/hr over 30 Minutes Intravenous  Once 07/31/22 1046 07/31/22 1641   07/31/22 1100  vancomycin (VANCOREADY) IVPB 1500 mg/300 mL        1,500 mg 150 mL/hr over 120 Minutes Intravenous  Once 07/31/22 1056 07/31/22 1641   07/30/22 1745  vancomycin (VANCOCIN) IVPB 1000 mg/200 mL premix        1,000 mg 200 mL/hr over 60 Minutes Intravenous  Once 07/30/22 1743 07/30/22 2054   07/30/22 1745  piperacillin-tazobactam (ZOSYN) IVPB 3.375 g        3.375 g 100 mL/hr over 30 Minutes Intravenous  Once 07/30/22 1743 07/30/22 2028        Medications  Scheduled Meds:  gabapentin  100 mg Oral BID   insulin aspart  0-15 Units Subcutaneous TID WC    insulin aspart  0-5 Units Subcutaneous QHS   losartan  25 mg Oral Daily   metoprolol succinate  50 mg Oral Daily   pramipexole  1.5 mg Oral QHS   rosuvastatin  20 mg Oral Daily   Continuous Infusions:  sodium chloride 100 mL/hr at 08/03/22 0329   piperacillin-tazobactam (ZOSYN)  IV 3.375 g (08/03/22 0511)   vancomycin 750 mg (08/03/22 1417)   PRN Meds:.acetaminophen **OR** acetaminophen, HYDROcodone-acetaminophen, morphine injection, ondansetron **OR** ondansetron (ZOFRAN) IV    Subjective:   Patient with no complaints  Objective:   Vitals:   08/02/22 0740 08/02/22 1511 08/02/22 2326 08/03/22 0753  BP: (!) 107/57 (!) 111/57 123/64 113/60  Pulse: 72 73 75 70  Resp: '17 17 20 18  '$ Temp: 98.3 F (36.8 C) 98.8 F (37.1 C) 98 F (36.7 C) 98.2 F (36.8 C)  TempSrc:      SpO2: 98% 99% 98% 97%  Weight:      Height:        Intake/Output Summary (Last 24 hours) at 08/03/2022 1556 Last data filed at 08/03/2022 1420 Gross per 24 hour  Intake 1819.14 ml  Output 1151 ml  Net 668.14 ml    Filed Weights   07/30/22 1427  Weight: 94.8 kg     Exam General: Alert and oriented x 3, NAD Left foot wrapped, in boot   Data Reviewed:  I have personally reviewed following labs and imaging studies Creatinine at 1.28   Radiology Studies: No results found.   No charge for visit today.  Annita Brod M.D. [facility] 08/03/2022, 3:56 PM  Available via Epic secure chat 7am-7pm After 7 pm, please refer to night coverage provider listed on amion.

## 2022-08-03 NOTE — Progress Notes (Signed)
   Subjective/Chief Complaint: Patient seen.  No complaints.   Objective: Vital signs in last 24 hours: Temp:  [98 F (36.7 C)-98.8 F (37.1 C)] 98.2 F (36.8 C) (01/01 0753) Pulse Rate:  [70-75] 70 (01/01 0753) Resp:  [17-20] 18 (01/01 0753) BP: (111-123)/(57-64) 113/60 (01/01 0753) SpO2:  [97 %-99 %] 97 % (01/01 0753) Last BM Date : 07/30/22  Intake/Output from previous day: 12/31 0701 - 01/01 0700 In: 1819.1 [P.O.:480; I.V.:887.6; IV Piggyback:451.6] Out: 1350 [Urine:1350] Intake/Output this shift: No intake/output data recorded.  Bandage on the left foot is dry and intact.  Upon removal there is still some purulence noted on the packing in the first interspace and from the left fourth toe.  Lab Results:  No results for input(s): "WBC", "HGB", "HCT", "PLT" in the last 72 hours. BMET Recent Labs    08/02/22 0424 08/03/22 0336  NA 137 139  K 3.4* 3.6  CL 103 109  CO2 25 23  GLUCOSE 169* 146*  BUN 25* 20  CREATININE 1.51* 1.28*  CALCIUM 8.2* 7.9*   PT/INR No results for input(s): "LABPROT", "INR" in the last 72 hours. ABG No results for input(s): "PHART", "HCO3" in the last 72 hours.  Invalid input(s): "PCO2", "PO2"  Studies/Results: No results found.  Anti-infectives: Anti-infectives (From admission, onward)    Start     Dose/Rate Route Frequency Ordered Stop   08/02/22 1400  vancomycin (VANCOREADY) IVPB 750 mg/150 mL        750 mg 150 mL/hr over 60 Minutes Intravenous Every 12 hours 08/02/22 1234     08/01/22 1300  vancomycin (VANCOREADY) IVPB 1250 mg/250 mL  Status:  Discontinued        1,250 mg 166.7 mL/hr over 90 Minutes Intravenous Every 24 hours 07/31/22 1618 08/02/22 1234   07/31/22 2200  piperacillin-tazobactam (ZOSYN) IVPB 3.375 g        3.375 g 12.5 mL/hr over 240 Minutes Intravenous Every 8 hours 07/31/22 1445     07/31/22 1100  piperacillin-tazobactam (ZOSYN) IVPB 3.375 g        3.375 g 100 mL/hr over 30 Minutes Intravenous  Once  07/31/22 1046 07/31/22 1641   07/31/22 1100  vancomycin (VANCOREADY) IVPB 1500 mg/300 mL        1,500 mg 150 mL/hr over 120 Minutes Intravenous  Once 07/31/22 1056 07/31/22 1641   07/30/22 1745  vancomycin (VANCOCIN) IVPB 1000 mg/200 mL premix        1,000 mg 200 mL/hr over 60 Minutes Intravenous  Once 07/30/22 1743 07/30/22 2054   07/30/22 1745  piperacillin-tazobactam (ZOSYN) IVPB 3.375 g        3.375 g 100 mL/hr over 30 Minutes Intravenous  Once 07/30/22 1743 07/30/22 2028       Assessment/Plan: s/p * No surgery found * Assessment: Osteomyelitis left foot.  Plan: Betadine gauze reapplied to the left fourth toe as well as packed into the first interspace wound.  Bulky bandage applied.  Discussed with the patient again the plan for surgery tomorrow afternoon for transmetatarsal amputation of the left foot.  Possible risks and complications of the procedure were discussed including inability of the wound to heal due to his diabetes, vascular status, or continued infection.  No guarantees could be given as to the outcome of surgery.  Patient will be n.p.o. after midnight.  Consent for transmetatarsal amputation left foot.  Plan for surgery tomorrow afternoon.  LOS: 4 days    [name] 08/03/2022

## 2022-08-03 NOTE — H&P (View-Only) (Signed)
   Subjective/Chief Complaint: Patient seen.  No complaints.   Objective: Vital signs in last 24 hours: Temp:  [98 F (36.7 C)-98.8 F (37.1 C)] 98.2 F (36.8 C) (01/01 0753) Pulse Rate:  [70-75] 70 (01/01 0753) Resp:  [17-20] 18 (01/01 0753) BP: (111-123)/(57-64) 113/60 (01/01 0753) SpO2:  [97 %-99 %] 97 % (01/01 0753) Last BM Date : 07/30/22  Intake/Output from previous day: 12/31 0701 - 01/01 0700 In: 1819.1 [P.O.:480; I.V.:887.6; IV Piggyback:451.6] Out: 1350 [Urine:1350] Intake/Output this shift: No intake/output data recorded.  Bandage on the left foot is dry and intact.  Upon removal there is still some purulence noted on the packing in the first interspace and from the left fourth toe.  Lab Results:  No results for input(s): "WBC", "HGB", "HCT", "PLT" in the last 72 hours. BMET Recent Labs    08/02/22 0424 08/03/22 0336  NA 137 139  K 3.4* 3.6  CL 103 109  CO2 25 23  GLUCOSE 169* 146*  BUN 25* 20  CREATININE 1.51* 1.28*  CALCIUM 8.2* 7.9*   PT/INR No results for input(s): "LABPROT", "INR" in the last 72 hours. ABG No results for input(s): "PHART", "HCO3" in the last 72 hours.  Invalid input(s): "PCO2", "PO2"  Studies/Results: No results found.  Anti-infectives: Anti-infectives (From admission, onward)    Start     Dose/Rate Route Frequency Ordered Stop   08/02/22 1400  vancomycin (VANCOREADY) IVPB 750 mg/150 mL        750 mg 150 mL/hr over 60 Minutes Intravenous Every 12 hours 08/02/22 1234     08/01/22 1300  vancomycin (VANCOREADY) IVPB 1250 mg/250 mL  Status:  Discontinued        1,250 mg 166.7 mL/hr over 90 Minutes Intravenous Every 24 hours 07/31/22 1618 08/02/22 1234   07/31/22 2200  piperacillin-tazobactam (ZOSYN) IVPB 3.375 g        3.375 g 12.5 mL/hr over 240 Minutes Intravenous Every 8 hours 07/31/22 1445     07/31/22 1100  piperacillin-tazobactam (ZOSYN) IVPB 3.375 g        3.375 g 100 mL/hr over 30 Minutes Intravenous  Once  07/31/22 1046 07/31/22 1641   07/31/22 1100  vancomycin (VANCOREADY) IVPB 1500 mg/300 mL        1,500 mg 150 mL/hr over 120 Minutes Intravenous  Once 07/31/22 1056 07/31/22 1641   07/30/22 1745  vancomycin (VANCOCIN) IVPB 1000 mg/200 mL premix        1,000 mg 200 mL/hr over 60 Minutes Intravenous  Once 07/30/22 1743 07/30/22 2054   07/30/22 1745  piperacillin-tazobactam (ZOSYN) IVPB 3.375 g        3.375 g 100 mL/hr over 30 Minutes Intravenous  Once 07/30/22 1743 07/30/22 2028       Assessment/Plan: s/p * No surgery found * Assessment: Osteomyelitis left foot.  Plan: Betadine gauze reapplied to the left fourth toe as well as packed into the first interspace wound.  Bulky bandage applied.  Discussed with the patient again the plan for surgery tomorrow afternoon for transmetatarsal amputation of the left foot.  Possible risks and complications of the procedure were discussed including inability of the wound to heal due to his diabetes, vascular status, or continued infection.  No guarantees could be given as to the outcome of surgery.  Patient will be n.p.o. after midnight.  Consent for transmetatarsal amputation left foot.  Plan for surgery tomorrow afternoon.  LOS: 4 days    Ricardo Brown 08/03/2022

## 2022-08-04 ENCOUNTER — Inpatient Hospital Stay: Payer: Medicare Other | Admitting: Anesthesiology

## 2022-08-04 ENCOUNTER — Encounter
Admission: EM | Disposition: A | Discharge status: Home Health | Payer: Self-pay | Source: Home / Self Care | Attending: Internal Medicine

## 2022-08-04 ENCOUNTER — Encounter: Payer: Self-pay | Admitting: Internal Medicine

## 2022-08-04 ENCOUNTER — Other Ambulatory Visit: Payer: Self-pay

## 2022-08-04 DIAGNOSIS — I5042 Chronic combined systolic (congestive) and diastolic (congestive) heart failure: Secondary | ICD-10-CM | POA: Diagnosis not present

## 2022-08-04 DIAGNOSIS — N179 Acute kidney failure, unspecified: Secondary | ICD-10-CM | POA: Diagnosis not present

## 2022-08-04 DIAGNOSIS — M86172 Other acute osteomyelitis, left ankle and foot: Secondary | ICD-10-CM | POA: Diagnosis not present

## 2022-08-04 DIAGNOSIS — I739 Peripheral vascular disease, unspecified: Secondary | ICD-10-CM | POA: Diagnosis not present

## 2022-08-04 HISTORY — PX: TRANSMETATARSAL AMPUTATION: SHX6197

## 2022-08-04 LAB — CULTURE, BLOOD (ROUTINE X 2)
Culture: NO GROWTH
Culture: NO GROWTH
Special Requests: ADEQUATE
Special Requests: ADEQUATE

## 2022-08-04 LAB — GLUCOSE, CAPILLARY
Glucose-Capillary: 169 mg/dL — ABNORMAL HIGH (ref 70–99)
Glucose-Capillary: 123 mg/dL — ABNORMAL HIGH (ref 70–99)
Glucose-Capillary: 129 mg/dL — ABNORMAL HIGH (ref 70–99)
Glucose-Capillary: 203 mg/dL — ABNORMAL HIGH (ref 70–99)
Glucose-Capillary: 245 mg/dL — ABNORMAL HIGH (ref 70–99)

## 2022-08-04 LAB — BASIC METABOLIC PANEL
Anion gap: 8 (ref 5–15)
BUN: 16 mg/dL (ref 8–23)
CO2: 22 mmol/L (ref 22–32)
Calcium: 8 mg/dL — ABNORMAL LOW (ref 8.9–10.3)
Chloride: 108 mmol/L (ref 98–111)
Creatinine, Ser: 1.05 mg/dL (ref 0.61–1.24)
GFR, Estimated: >60 mL/min (ref >60)
Glucose, Bld: 200 mg/dL — ABNORMAL HIGH (ref 70–99)
Potassium: 3.7 mmol/L (ref 3.5–5.1)
Sodium: 138 mmol/L (ref 135–145)

## 2022-08-04 LAB — VANCOMYCIN, PEAK: Vancomycin Pk: 13 ug/mL — ABNORMAL LOW (ref 30–40)

## 2022-08-04 SURGERY — AMPUTATION, FOOT, TRANSMETATARSAL
Anesthesia: Monitor Anesthesia Care | Site: Toe | Laterality: Left

## 2022-08-04 MED ORDER — FENTANYL CITRATE (PF) 100 MCG/2ML IJ SOLN: 25.0000 ug | INTRAMUSCULAR | Status: DC | PRN

## 2022-08-04 MED ORDER — EPHEDRINE 5 MG/ML INJ
INTRAVENOUS | Status: AC
  Filled 2022-08-04: qty 5

## 2022-08-04 MED ORDER — BUPIVACAINE HCL (PF) 0.5 % IJ SOLN
INTRAMUSCULAR | Status: DC | PRN
  Administered 2022-08-04: 20 mL

## 2022-08-04 MED ORDER — BUPIVACAINE HCL (PF) 0.5 % IJ SOLN
INTRAMUSCULAR | Status: AC
  Filled 2022-08-04: qty 30

## 2022-08-04 MED ORDER — LIDOCAINE HCL (PF) 2 % IJ SOLN
INTRAMUSCULAR | Status: AC
  Filled 2022-08-04: qty 5

## 2022-08-04 MED ORDER — FENTANYL CITRATE (PF) 100 MCG/2ML IJ SOLN
INTRAMUSCULAR | Status: DC | PRN
  Administered 2022-08-04: 25 ug via INTRAVENOUS

## 2022-08-04 MED ORDER — PROPOFOL 10 MG/ML IV BOLUS
INTRAVENOUS | Status: DC | PRN
  Administered 2022-08-04: 175 mg via INTRAVENOUS

## 2022-08-04 MED ORDER — 0.9 % SODIUM CHLORIDE (POUR BTL) OPTIME
TOPICAL | Status: DC | PRN
  Administered 2022-08-04: 2000 mL

## 2022-08-04 MED ORDER — PROPOFOL 500 MG/50ML IV EMUL
INTRAVENOUS | Status: DC | PRN
  Administered 2022-08-04: 100 ug/kg/min via INTRAVENOUS

## 2022-08-04 MED ORDER — FENTANYL CITRATE (PF) 100 MCG/2ML IJ SOLN
INTRAMUSCULAR | Status: AC
  Filled 2022-08-04: qty 2

## 2022-08-04 MED ORDER — MIDAZOLAM HCL 2 MG/2ML IJ SOLN
INTRAMUSCULAR | Status: AC
  Filled 2022-08-04: qty 2

## 2022-08-04 MED ORDER — ONDANSETRON HCL 4 MG/2ML IJ SOLN: 4.0000 mg | Freq: Once | INTRAMUSCULAR | Status: DC | PRN

## 2022-08-04 MED ORDER — PROPOFOL 1000 MG/100ML IV EMUL
INTRAVENOUS | Status: AC
  Filled 2022-08-04: qty 100

## 2022-08-04 MED ORDER — MIDAZOLAM HCL 2 MG/2ML IJ SOLN
INTRAMUSCULAR | Status: DC | PRN
  Administered 2022-08-04: 2 mg via INTRAVENOUS

## 2022-08-04 MED ORDER — VANCOMYCIN HCL IN DEXTROSE 750-5 MG/150ML-% IV SOLN
750.0000 mg | Freq: Two times a day (BID) | INTRAVENOUS | Status: DC
  Administered 2022-08-04 – 2022-08-05 (×2): 750 mg via INTRAVENOUS
  Filled 2022-08-04 (×2): qty 150

## 2022-08-04 MED ORDER — ONDANSETRON HCL 4 MG/2ML IJ SOLN
INTRAMUSCULAR | Status: DC | PRN
  Administered 2022-08-04: 4 mg via INTRAVENOUS

## 2022-08-04 MED ORDER — EPHEDRINE SULFATE (PRESSORS) 50 MG/ML IJ SOLN
INTRAMUSCULAR | Status: DC | PRN
  Administered 2022-08-04: 10 mg via INTRAVENOUS

## 2022-08-04 MED ORDER — PIPERACILLIN-TAZOBACTAM 3.375 G IVPB
INTRAVENOUS | Status: AC
  Administered 2022-08-05: 3.375 g via INTRAVENOUS
  Filled 2022-08-04: qty 50

## 2022-08-04 SURGICAL SUPPLY — 45 items
BLADE MED AGGRESSIVE (BLADE) ×1 IMPLANT
BLADE SURG SZ11 CARB STEEL (BLADE) IMPLANT
BNDG COHESIVE 4X5 TAN STRL LF (GAUZE/BANDAGES/DRESSINGS) ×1 IMPLANT
BNDG ELASTIC 4X5.8 VLCR NS LF (GAUZE/BANDAGES/DRESSINGS) ×1 IMPLANT
BNDG ESMARK 4X12 TAN STRL LF (GAUZE/BANDAGES/DRESSINGS) ×1 IMPLANT
BNDG GAUZE DERMACEA FLUFF 4 (GAUZE/BANDAGES/DRESSINGS) ×1 IMPLANT
CUFF TOURN SGL QUICK 12 (TOURNIQUET CUFF) IMPLANT
CUFF TOURN SGL QUICK 18X4 (TOURNIQUET CUFF) IMPLANT
DRAIN PENROSE 12X.25 LTX STRL (MISCELLANEOUS) ×1 IMPLANT
DRAPE FLUOR MINI C-ARM 54X84 (DRAPES) ×1 IMPLANT
DRSG GAUZE FLUFF 36X18 (GAUZE/BANDAGES/DRESSINGS) ×1 IMPLANT
DURAPREP 26ML APPLICATOR (WOUND CARE) ×1 IMPLANT
ELECT REM PT RETURN 9FT ADLT (ELECTROSURGICAL) ×1
ELECTRODE REM PT RTRN 9FT ADLT (ELECTROSURGICAL) ×1 IMPLANT
GAUZE 4X4 16PLY ~~LOC~~+RFID DBL (SPONGE) IMPLANT
GAUZE SPONGE 4X4 12PLY STRL (GAUZE/BANDAGES/DRESSINGS) ×1 IMPLANT
GAUZE XEROFORM 1X8 LF (GAUZE/BANDAGES/DRESSINGS) ×1 IMPLANT
GLOVE BIO SURGEON STRL SZ7.5 (GLOVE) ×1 IMPLANT
GLOVE SURG UNDER LTX SZ8 (GLOVE) ×1 IMPLANT
GOWN STRL REUS W/ TWL LRG LVL3 (GOWN DISPOSABLE) ×2 IMPLANT
GOWN STRL REUS W/TWL LRG LVL3 (GOWN DISPOSABLE) ×2
HANDLE YANKAUER SUCT BULB TIP (MISCELLANEOUS) ×1 IMPLANT
HANDPIECE VERSAJET DEBRIDEMENT (MISCELLANEOUS) ×1 IMPLANT
IV NS IRRIG 3000ML ARTHROMATIC (IV SOLUTION) ×1 IMPLANT
KIT TURNOVER KIT A (KITS) ×1 IMPLANT
LABEL OR SOLS (LABEL) ×1 IMPLANT
MANIFOLD NEPTUNE II (INSTRUMENTS) ×1 IMPLANT
NDL SAFETY ECLIP 18X1.5 (MISCELLANEOUS) ×1 IMPLANT
NS IRRIG 500ML POUR BTL (IV SOLUTION) ×1 IMPLANT
PACK EXTREMITY ARMC (MISCELLANEOUS) ×1 IMPLANT
PAD ABD DERMACEA PRESS 5X9 (GAUZE/BANDAGES/DRESSINGS) ×1 IMPLANT
PULSAVAC PLUS IRRIG FAN TIP (DISPOSABLE) ×1
SOL PREP PVP 2OZ (MISCELLANEOUS) ×1
SOLUTION PREP PVP 2OZ (MISCELLANEOUS) ×1 IMPLANT
SPONGE T-LAP 18X18 ~~LOC~~+RFID (SPONGE) ×1 IMPLANT
STAPLER SKIN PROX 35W (STAPLE) ×1 IMPLANT
STOCKINETTE STRL 6IN 960660 (GAUZE/BANDAGES/DRESSINGS) ×1 IMPLANT
STRAP SAFETY 5IN WIDE (MISCELLANEOUS) ×1 IMPLANT
SUT VIC AB 2-0 CT1 27 (SUTURE) ×2
SUT VIC AB 2-0 CT1 TAPERPNT 27 (SUTURE) ×2 IMPLANT
SUT VICRYL+ 3-0 36IN CT-1 (SUTURE) ×2 IMPLANT
SYR 10ML LL (SYRINGE) ×2 IMPLANT
TIP FAN IRRIG PULSAVAC PLUS (DISPOSABLE) IMPLANT
TRAP FLUID SMOKE EVACUATOR (MISCELLANEOUS) ×1 IMPLANT
WATER STERILE IRR 500ML POUR (IV SOLUTION) ×1 IMPLANT

## 2022-08-04 NOTE — Progress Notes (Signed)
[facility]                                                                               Ricardo Brown, is a 62 y.o. male, DOB - [birth year], NKN:[id number] Admit date - 07/30/2022    Outpatient Primary MD for the patient is [name], MD  LOS - 5  days    Brief summary   62 year old male with past medical history of diabetes mellitus, poorly controlled, CAD status post CABG, PAD, chronic systolic/diastolic heart failure and obesity who was hospitalized 2 months ago for abscess of the first second interspace of foot and osteomyelitis of second MTP joint status post I&D who was sent to the emergency room on 12/28 for concerns of recurrent osteomyelitis.  Admitted to the hospitalist service.  MRI done noted osteomyelitis in the left fourth toe as well as second toe and metatarsal.  Started on IV antibiotics and podiatry plans to take patient to the OR on 1/2 for transmetatarsal amputation.   Assessment & Plan    Assessment and Plan: * Osteomyelitis of left foot (HCC) Recurrent.  Status post transmetatarsal amputation 1/2.  Continue antibiotics until pathology has come back.   Acute renal failure superimposed on stage 2 chronic kidney disease (HCC)-resolved Treated with IV fluids.  Creatinine at 1.05 on 1/2.   Chronic combined systolic (congestive) and diastolic (congestive) heart failure ([place]) -2D echo 5/23 had shown EF of 45 to 50% Continue losartan.  Recheck BNP in the morning.  Type 2 diabetes mellitus with diabetic chronic kidney disease ([place]) CBG (last 3)  Recent Labs    08/04/22 0743 08/04/22 1134 08/04/22 1435  GLUCAP 169* 123* 129*    Resume SSI.    PAD (peripheral artery disease) ([place]) History of left lower extremity angiogram on 11/6 Plavix last given on Friday, 12/29. Continue rosuvastatin  Hypertension BP stable,well controlled.   continue metoprolol, and losartan  Coronary artery disease No complaints of chest pain, EKG  nonacute Continue rosuvastatin, aspirin and metoprolol.   Holding Plavix for procedure  Obesity: Patient meets criteria with BMI greater than 30 Estimated body mass index is 31.78 kg/m as calculated from the following:   Height as of this encounter: '5\' 8"'$  (1.727 m).   Weight as of this encounter: 94.8 kg.  Code Status: full code.  DVT Prophylaxis:  SCDs Start: 07/30/22 1940   Level of Care: Level of care: Med-Surg Family Communication: Wife at bedside  Disposition Plan:     Remains inpatient appropriate:   -Pathology back and determination of further antibiotics needed  Procedures:  Status post transmetatarsal amputation  Consultants:   PODIATRY.   Antimicrobials:   Anti-infectives (From admission, onward)    Start     Dose/Rate Route Frequency Ordered Stop   08/04/22 1400  vancomycin (VANCOCIN) IVPB 750 mg/150 ml premix        750 mg 150 mL/hr over 60 Minutes Intravenous Every 12 hours 08/04/22 1329     08/04/22 1312  piperacillin-tazobactam (ZOSYN) 3.375 GM/50ML IVPB       Note to Pharmacy: Maryagnes Amos B: cabinet override      08/04/22 1312 08/04/22 1340   08/02/22 1400  vancomycin (VANCOREADY) IVPB 750 mg/150 mL  Status:  Discontinued        750 mg 150 mL/hr over 60 Minutes Intravenous Every 12 hours 08/02/22 1234 08/04/22 1329   08/01/22 1300  vancomycin (VANCOREADY) IVPB 1250 mg/250 mL  Status:  Discontinued        1,250 mg 166.7 mL/hr over 90 Minutes Intravenous Every 24 hours 07/31/22 1618 08/02/22 1234   07/31/22 2200  piperacillin-tazobactam (ZOSYN) IVPB 3.375 g        3.375 g 12.5 mL/hr over 240 Minutes Intravenous Every 8 hours 07/31/22 1445     07/31/22 1100  piperacillin-tazobactam (ZOSYN) IVPB 3.375 g        3.375 g 100 mL/hr over 30 Minutes Intravenous  Once 07/31/22 1046 07/31/22 1641   07/31/22 1100  vancomycin (VANCOREADY) IVPB 1500 mg/300 mL        1,500 mg 150 mL/hr over 120 Minutes Intravenous  Once 07/31/22 1056 07/31/22 1641   07/30/22  1745  vancomycin (VANCOCIN) IVPB 1000 mg/200 mL premix        1,000 mg 200 mL/hr over 60 Minutes Intravenous  Once 07/30/22 1743 07/30/22 2054   07/30/22 1745  piperacillin-tazobactam (ZOSYN) IVPB 3.375 g        3.375 g 100 mL/hr over 30 Minutes Intravenous  Once 07/30/22 1743 07/30/22 2028        Medications  Scheduled Meds:  gabapentin  100 mg Oral BID   insulin aspart  0-15 Units Subcutaneous TID WC   insulin aspart  0-5 Units Subcutaneous QHS   losartan  25 mg Oral Daily   metoprolol succinate  50 mg Oral Daily   pramipexole  1.5 mg Oral QHS   rosuvastatin  20 mg Oral Daily   Continuous Infusions:  sodium chloride Stopped (08/04/22 1415)   piperacillin-tazobactam (ZOSYN)  IV 3.375 g (08/04/22 0554)   vancomycin 750 mg (08/04/22 1434)   PRN Meds:.acetaminophen **OR** acetaminophen, HYDROcodone-acetaminophen, morphine injection, ondansetron **OR** ondansetron (ZOFRAN) IV    Subjective:   Seen before surgery, no complaints  Objective:   Vitals:   08/04/22 1430 08/04/22 1445 08/04/22 1504 08/04/22 1521  BP: 121/68 123/71 137/75 (!) 147/77  Pulse: 61 (!) 58 (!) 59 (!) 59  Resp: '18 17 15 18  '$ Temp: 97.8 F (36.6 C)   97.8 F (36.6 C)  TempSrc:      SpO2: 100% 99% 100% 100%  Weight:      Height:        Intake/Output Summary (Last 24 hours) at 08/04/2022 1525 Last data filed at 08/04/2022 1507 Gross per 24 hour  Intake 1780 ml  Output 501 ml  Net 1279 ml    Filed Weights   07/30/22 1427 08/04/22 1140  Weight: 94.8 kg 94.8 kg     Exam General: Alert and oriented x 3, NAD Left foot wrapped, in boot   Data Reviewed:  I have personally reviewed following labs and imaging studies Creatinine of 1.05   Radiology Studies: No results found.    Annita Brod M.D. [facility] 08/04/2022, 3:25 PM  Available via Epic secure chat 7am-7pm After 7 pm, please refer to night coverage provider listed on amion.

## 2022-08-04 NOTE — Transfer of Care (Signed)
Immediate Anesthesia Transfer of Care Note  Patient: Ricardo Brown  Procedure(s) Performed: TRANSMETATARSAL AMPUTATION (Left: Toe)  Patient Location: PACU  Anesthesia Type:General  Level of Consciousness: awake, alert , oriented, and patient cooperative  Airway & Oxygen Therapy: Patient Spontanous Breathing and Patient connected to face mask oxygen  Post-op Assessment: Report given to RN, Post -op Vital signs reviewed and stable, and Patient moving all extremities  Post vital signs: Reviewed and stable  Last Vitals:  Vitals Value Taken Time  BP 121/68 08/04/22 1430  Temp    Pulse 61 08/04/22 1434  Resp 17 08/04/22 1434  SpO2 100 % 08/04/22 1434  Vitals shown include unvalidated device data.  Last Pain:  Vitals:   08/04/22 1140  TempSrc: Oral  PainSc: 0-No pain         Complications: No notable events documented.

## 2022-08-04 NOTE — Interval H&P Note (Signed)
History and Physical Interval Note:  08/04/2022 12:34 PM  Ricardo Brown  has presented today for surgery, with the diagnosis of AMPUTATION.  The various methods of treatment have been discussed with the patient and family. After consideration of risks, benefits and other options for treatment, the patient has consented to  Procedure(s): TRANSMETATARSAL AMPUTATION (Left) as a surgical intervention.  The patient's history has been reviewed, patient examined, no change in status, stable for surgery.  I have reviewed the patient's chart and labs.  Questions were answered to the patient's satisfaction.     [name]

## 2022-08-04 NOTE — Op Note (Signed)
Date of operation: 08/04/2022.  Surgeon: [name] D.P.M.  Preoperative diagnosis: Osteomyelitis left forefoot.  Postoperative diagnosis: Same.  Procedure: Transmetatarsal amputation left foot.  Anesthesia: LMA.  Hemostasis: Pneumatic tourniquet left ankle 250 mmHg.  Estimated blood loss: 10 cc.  Cultures: Bone culture left second metatarsal and proximal phalanx fourth toe.  Pathology: 1.  Left forefoot. 2.  Additional segment second metatarsal with proximal border inked purple.  Injectables: 20 cc 0.5% Marcaine plain.  Complications: None apparent.  Operative indications: This is a 62 year old male with recent history of abscess with I&D on the left forefoot.  Postoperatively wound continued to digress with development of new ulceration with osteomyelitis in the left fourth toe as well as the second metatarsal.  Decision was made for transmetatarsal amputation as his best option for healing.  Operative procedure: Patient was taken to the operating room and placed on the table in the supine position.  Following satisfactory LMA anesthesia a pneumatic tourniquet was applied at the level of the left ankle and the foot was prepped and draped in the usual sterile fashion.  The foot was exsanguinated and the tourniquet inflated to 250 mmHg.  Attention was directed to the distal aspect of the left foot where a dorsal incision was made coursing from medial to lateral from the first metatarsal just proximal to the toes and ending at the fifth metatarsal.  Incision was deepened sharply down to the level of the bone and dissection carried back dorsally along the metatarsal shafts using a large key elevator.  Bleeders were bovied as encountered.  Next a similar plantar incision was made from medial to lateral and carried sharply down to the level of the plantar bones and dissection carried back along the metatarsal shaft using a large key elevator.  Next a transmetatarsal amputation was performed  with incising metatarsals 1 through 5 using a sagittal saw and the forefoot was removed in toto.  Sample of bone from the second metatarsal and head of the proximal phalanx of the fourth toe were then taken for culture.  There was noted to be still some prominence of bone limiting ease of closure of the skin flaps so a second portion of bone was removed from all 5 metatarsals.  The additional fragment from the second metatarsal was inked at the proximal aspect and sent for pathology as well.  The wound was then debulked centrally.  Debridement carried out with a Versajet debrider on a setting of 4 throughout the forefoot, especially around the second metatarsal where the largest portion of abscess was noted.  The wound was then thoroughly irrigated with 1 L of sterile saline under pulse irrigation.  The amputation site was then closed using dissing 2-0 Vicryl simple interrupted sutures for deep and more superficial closure followed by 3-0 Vicryl simple interrupted sutures for subcutaneous closure followed by skin closure using staples.  20 cc of 0.5% Marcaine plain was then injected for postoperative analgesia around the forefoot.  Xeroform 4 x 4's ABDs and Kerlix applied to the left foot.  The tourniquet was released.  A second Kerlix and Ace wrap then applied to the left foot for compression.  Patient was awakened and transported to the PACU having tolerated the anesthesia and procedures well.

## 2022-08-04 NOTE — Anesthesia Procedure Notes (Signed)
Procedure Name: LMA Insertion Date/Time: 08/04/2022 12:56 PM  Performed by: Candice Camp, CRNAPatient Re-evaluated:Patient Re-evaluated prior to induction Oxygen Delivery Method: Circle system utilized Preoxygenation: Pre-oxygenation with 100% oxygen Induction Type: IV induction Ventilation: Mask ventilation without difficulty LMA: LMA inserted LMA Size: 5.0 Number of attempts: 1

## 2022-08-04 NOTE — Anesthesia Preprocedure Evaluation (Signed)
Anesthesia Evaluation  Patient identified by MRN, date of birth, ID band Patient awake    Reviewed: Allergy & Precautions, NPO status , Patient's Chart, lab work & pertinent test results  Airway Mallampati: III  TM Distance: >3 FB Neck ROM: full    Dental  (+) Edentulous Upper, Edentulous Lower   Pulmonary neg pulmonary ROS   Pulmonary exam normal  + decreased breath sounds      Cardiovascular Exercise Tolerance: Poor hypertension, Pt. on medications + CAD, + Past MI, +CHF and + DOE  negative cardio ROS Normal cardiovascular exam Rhythm:Regular     Neuro/Psych negative neurological ROS  negative psych ROS   GI/Hepatic negative GI ROS, Neg liver ROS,,,  Endo/Other  negative endocrine ROSdiabetes, Type 1, Insulin Dependent  Morbid obesity  Renal/GU CRFRenal disease     Musculoskeletal   Abdominal  (+) + obese  Peds negative pediatric ROS (+)  Hematology negative hematology ROS (+) Blood dyscrasia, anemia   Anesthesia Other Findings Past Medical History: No date: Chronic combined systolic (congestive) and diastolic  (congestive) heart failure (HCC)     Comment:  a. 01/2018 EF 35-40%; b. 10/2020 Echo: EF 40-45%, glob HK.              RVSP 42.16mHg. Mildly dil LA. Mild MS/AoV sclerosis. No date: CKD (chronic kidney disease), stage III (HCC) No date: Coronary artery disease     Comment:  a. 01/2018 late presenting inferior MI; b. 01/2018 Cath:               Severe multivessel dzs-->CABG x 3 (LIMA->LAD, VG->OM2,               VG->RPDA @ [facility]). No date: Diabetes mellitus without complication (HCC)     Comment:  a. Dx ~ 2000. No date: Heart palpitations     Comment:  a. Pt reports prior nl echo's and stress tests. Last               stress test w/in past 2 yrs - PCP. No date: High cholesterol No date: Hypertension No date: Mitral regurgitation     Comment:  a. 01/2018 s/p MV repair @ time of CABG. No date: Myocardial  infarction ([facility] No date: Postoperative atrial fibrillation     Comment:  a. 01/2018 @ time of CABG. No date: PSVT (paroxysmal supraventricular tachycardia)     Comment:  a. Very symptomatic with multiple ED evaluations.  Has               been on amio. 12/19/2021: Severe sepsis with acute organ dysfunction ([place] 09/07/2019: Sleep difficulties 01/24/2018: STEMI (ST elevation myocardial infarction) ([facility]  Past Surgical History: 08/29/2021: AMPUTATION TOE; Right     Comment:  Procedure: AMPUTATION TOE-2nd toe;  Surgeon: CSharlotte Alamo DPM;  Location: [facility] ORS;  Service: Podiatry;                Laterality: Right; 12/22/2021: AMPUTATION TOE; Left     Comment:  Procedure: AMPUTATION TOE-2nd Toe Partial;  Surgeon:               BCaroline More DPM;  Location: [facility] ORS;  Service:               Podiatry;  Laterality: Left; No date: CARDIAC CATHETERIZATION No date: CARDIAC VALVE REPLACEMENT     Comment:  Mitral Valve Repair 06/07/2019: COLONOSCOPY WITH PROPOFOL; N/A  Comment:  Procedure: COLONOSCOPY WITH PROPOFOL;  Surgeon:               [name], MD;  Location: [facility] ENDOSCOPY;                Service: Endoscopy;  Laterality: N/A; 02/07/2020: COLONOSCOPY WITH PROPOFOL; N/A     Comment:  Procedure: COLONOSCOPY WITH PROPOFOL;  Surgeon:               [name], MD;  Location: [facility] ENDOSCOPY;                Service: Endoscopy;  Laterality: N/A; 12/11/2020: COLONOSCOPY WITH PROPOFOL; N/A     Comment:  Procedure: COLONOSCOPY WITH PROPOFOL;  Surgeon:               [name], MD;  Location: [facility] ENDOSCOPY;                Service: Endoscopy;  Laterality: N/A; No date: CORONARY ANGIOPLASTY No date: CORONARY ARTERY BYPASS GRAFT     Comment:  x 3   01/2018 02/07/2020: ESOPHAGOGASTRODUODENOSCOPY; N/A     Comment:  Procedure: ESOPHAGOGASTRODUODENOSCOPY (EGD);  Surgeon:               [name], MD;  Location: [facility] ENDOSCOPY;                Service:  Endoscopy;  Laterality: N/A; 06/05/2022: INCISION AND DRAINAGE; Left     Comment:  Procedure: INCISION AND DRAINAGE;  Surgeon: Caroline More, DPM;  Location: [facility] ORS;  Service: Podiatry;                Laterality: Left; 01/24/2018: LEFT HEART CATH AND CORONARY ANGIOGRAPHY; N/A     Comment:  Procedure: LEFT HEART CATH AND CORONARY ANGIOGRAPHY;                Surgeon: [name], MD;  Location: [place]               CV LAB;  Service: Cardiovascular;  Laterality: N/A; 06/08/2022: LOWER EXTREMITY ANGIOGRAPHY; Left     Comment:  Procedure: Lower Extremity Angiography;  Surgeon: Algernon Huxley, MD;  Location: [place] CV LAB;  Service:               Cardiovascular;  Laterality: Left; 09/12/2021: SVT ABLATION; N/A     Comment:  Procedure: SVT ABLATION;  Surgeon: Vickie Epley,               MD;  Location: [place] CV LAB;  Service:               Cardiovascular;  Laterality: N/A; No date: TONSILLECTOMY  BMI    Body Mass Index: 31.78 kg/m      Reproductive/Obstetrics negative OB ROS                             Anesthesia Physical Anesthesia Plan  ASA: 4  Anesthesia Plan: MAC   Post-op Pain Management:    Induction: Intravenous  PONV Risk Score and Plan:   Airway Management Planned: Natural Airway  Additional Equipment:   Intra-op Plan:   Post-operative Plan:   Informed Consent: I have reviewed the patients History and Physical, chart, labs  and discussed the procedure including the risks, benefits and alternatives for the proposed anesthesia with the patient or authorized representative who has indicated his/her understanding and acceptance.       Plan Discussed with: CRNA and Surgeon  Anesthesia Plan Comments:        Anesthesia Quick Evaluation

## 2022-08-05 ENCOUNTER — Encounter: Payer: Self-pay | Admitting: Podiatry

## 2022-08-05 DIAGNOSIS — N179 Acute kidney failure, unspecified: Secondary | ICD-10-CM | POA: Diagnosis not present

## 2022-08-05 DIAGNOSIS — D62 Acute posthemorrhagic anemia: Secondary | ICD-10-CM | POA: Diagnosis not present

## 2022-08-05 DIAGNOSIS — I5042 Chronic combined systolic (congestive) and diastolic (congestive) heart failure: Secondary | ICD-10-CM | POA: Diagnosis not present

## 2022-08-05 DIAGNOSIS — M86172 Other acute osteomyelitis, left ankle and foot: Secondary | ICD-10-CM | POA: Diagnosis not present

## 2022-08-05 LAB — CBC
HCT: 28.6 % — ABNORMAL LOW (ref 39.0–52.0)
Hemoglobin: 9.1 g/dL — ABNORMAL LOW (ref 13.0–17.0)
MCH: 28.4 pg (ref 26.0–34.0)
MCHC: 31.8 g/dL (ref 30.0–36.0)
MCV: 89.4 fL (ref 80.0–100.0)
Platelets: 247 10*3/uL (ref 150–400)
RBC: 3.2 MIL/uL — ABNORMAL LOW (ref 4.22–5.81)
RDW: 13.7 % (ref 11.5–15.5)
WBC: 10.6 10*3/uL — ABNORMAL HIGH (ref 4.0–10.5)
nRBC: 0 % (ref 0.0–0.2)

## 2022-08-05 LAB — VANCOMYCIN, TROUGH
Vancomycin Tr: 10 ug/mL — ABNORMAL LOW (ref 15–20)
Vancomycin Tr: 12 ug/mL — ABNORMAL LOW (ref 15–20)

## 2022-08-05 LAB — GLUCOSE, CAPILLARY
Glucose-Capillary: 139 mg/dL — ABNORMAL HIGH (ref 70–99)
Glucose-Capillary: 150 mg/dL — ABNORMAL HIGH (ref 70–99)
Glucose-Capillary: 177 mg/dL — ABNORMAL HIGH (ref 70–99)
Glucose-Capillary: 189 mg/dL — ABNORMAL HIGH (ref 70–99)

## 2022-08-05 LAB — BASIC METABOLIC PANEL
Anion gap: 7 (ref 5–15)
BUN: 16 mg/dL (ref 8–23)
CO2: 20 mmol/L — ABNORMAL LOW (ref 22–32)
Calcium: 7.8 mg/dL — ABNORMAL LOW (ref 8.9–10.3)
Chloride: 106 mmol/L (ref 98–111)
Creatinine, Ser: 1.22 mg/dL (ref 0.61–1.24)
GFR, Estimated: >60 mL/min (ref >60)
Glucose, Bld: 154 mg/dL — ABNORMAL HIGH (ref 70–99)
Potassium: 4.1 mmol/L (ref 3.5–5.1)
Sodium: 133 mmol/L — ABNORMAL LOW (ref 135–145)

## 2022-08-05 LAB — BRAIN NATRIURETIC PEPTIDE: B Natriuretic Peptide: 385.3 pg/mL — ABNORMAL HIGH (ref 0.0–100.0)

## 2022-08-05 MED ORDER — ENOXAPARIN SODIUM 60 MG/0.6ML IJ SOSY
0.5000 mg/kg | PREFILLED_SYRINGE | INTRAMUSCULAR | Status: DC
  Administered 2022-08-05 – 2022-08-06 (×2): 50 mg via SUBCUTANEOUS
  Administered 2022-08-07 – 2022-08-10 (×4): 47.5 mg via SUBCUTANEOUS
  Filled 2022-08-05 (×6): qty 0.6

## 2022-08-05 MED ORDER — FUROSEMIDE 10 MG/ML IJ SOLN
20.0000 mg | Freq: Once | INTRAMUSCULAR | Status: AC
  Administered 2022-08-05: 20 mg via INTRAVENOUS
  Filled 2022-08-05: qty 4

## 2022-08-05 MED ORDER — VANCOMYCIN HCL 750 MG/150ML IV SOLN
750.0000 mg | Freq: Two times a day (BID) | INTRAVENOUS | Status: DC
  Administered 2022-08-05 – 2022-08-06 (×2): 750 mg via INTRAVENOUS
  Filled 2022-08-05 (×4): qty 150

## 2022-08-05 MED ORDER — CLOPIDOGREL BISULFATE 75 MG PO TABS
75.0000 mg | ORAL_TABLET | Freq: Every day | ORAL | Status: DC
  Administered 2022-08-06 – 2022-08-11 (×6): 75 mg via ORAL
  Filled 2022-08-05 (×6): qty 1

## 2022-08-05 MED ORDER — VANCOMYCIN HCL 1250 MG/250ML IV SOLN
1250.0000 mg | Freq: Two times a day (BID) | INTRAVENOUS | Status: DC
  Filled 2022-08-05: qty 250

## 2022-08-05 NOTE — Progress Notes (Signed)
1 Day Post-Op   Subjective/Chief Complaint: Patient seen.  Some pain overnight but seems to be fairly well-controlled with medication.   Objective: Vital signs in last 24 hours: Temp:  [97.8 F (36.6 C)-98.8 F (37.1 C)] 98.8 F (37.1 C) (01/03 0839) Pulse Rate:  [58-80] 73 (01/03 0839) Resp:  [15-20] 16 (01/03 0839) BP: (108-150)/(57-77) 108/57 (01/03 0839) SpO2:  [97 %-100 %] 98 % (01/03 0839) Last BM Date : 08/04/22  Intake/Output from previous day: 01/02 0701 - 01/03 0700 In: 2676 [P.O.:1020; I.V.:1100; IV Piggyback:556] Out: 0160 [Urine:1850; Blood:1] Intake/Output this shift: Total I/O In: 240 [P.O.:240] Out: 400 [Urine:400]  Moderate to heavy bleeding noted on the bandaging.  Upon removal the incision is well coapted with no signs of incisional necrosis.  Erythema and edema improving.  Overall the surgical site appears stable.     Lab Results:  Recent Labs    08/05/22 0319  WBC 10.6*  HGB 9.1*  HCT 28.6*  PLT 247   BMET Recent Labs    08/04/22 0333 08/05/22 0319  NA 138 133*  K 3.7 4.1  CL 108 106  CO2 22 20*  GLUCOSE 200* 154*  BUN 16 16  CREATININE 1.05 1.22  CALCIUM 8.0* 7.8*   PT/INR No results for input(s): "LABPROT", "INR" in the last 72 hours. ABG No results for input(s): "PHART", "HCO3" in the last 72 hours.  Invalid input(s): "PCO2", "PO2"  Studies/Results: No results found.  Anti-infectives: Anti-infectives (From admission, onward)    Start     Dose/Rate Route Frequency Ordered Stop   08/05/22 1500  vancomycin (VANCOREADY) IVPB 1250 mg/250 mL  Status:  Discontinued        1,250 mg 166.7 mL/hr over 90 Minutes Intravenous Every 12 hours 08/05/22 0432 08/05/22 0751   08/05/22 1500  vancomycin (VANCOREADY) IVPB 750 mg/150 mL        750 mg 150 mL/hr over 60 Minutes Intravenous Every 12 hours 08/05/22 0751     08/04/22 1400  vancomycin (VANCOCIN) IVPB 750 mg/150 ml premix  Status:  Discontinued        750 mg 150 mL/hr over 60  Minutes Intravenous Every 12 hours 08/04/22 1329 08/05/22 0432   08/04/22 1312  piperacillin-tazobactam (ZOSYN) 3.375 GM/50ML IVPB       Note to Pharmacy: Maryagnes Amos B: cabinet override      08/04/22 1312 08/04/22 1340   08/02/22 1400  vancomycin (VANCOREADY) IVPB 750 mg/150 mL  Status:  Discontinued        750 mg 150 mL/hr over 60 Minutes Intravenous Every 12 hours 08/02/22 1234 08/04/22 1329   08/01/22 1300  vancomycin (VANCOREADY) IVPB 1250 mg/250 mL  Status:  Discontinued        1,250 mg 166.7 mL/hr over 90 Minutes Intravenous Every 24 hours 07/31/22 1618 08/02/22 1234   07/31/22 2200  piperacillin-tazobactam (ZOSYN) IVPB 3.375 g        3.375 g 12.5 mL/hr over 240 Minutes Intravenous Every 8 hours 07/31/22 1445     07/31/22 1100  piperacillin-tazobactam (ZOSYN) IVPB 3.375 g        3.375 g 100 mL/hr over 30 Minutes Intravenous  Once 07/31/22 1046 07/31/22 1641   07/31/22 1100  vancomycin (VANCOREADY) IVPB 1500 mg/300 mL        1,500 mg 150 mL/hr over 120 Minutes Intravenous  Once 07/31/22 1056 07/31/22 1641   07/30/22 1745  vancomycin (VANCOCIN) IVPB 1000 mg/200 mL premix        1,000 mg  200 mL/hr over 60 Minutes Intravenous  Once 07/30/22 1743 07/30/22 2054   07/30/22 1745  piperacillin-tazobactam (ZOSYN) IVPB 3.375 g        3.375 g 100 mL/hr over 30 Minutes Intravenous  Once 07/30/22 1743 07/30/22 2028       Assessment/Plan: s/p Procedure(s): TRANSMETATARSAL AMPUTATION (Left) Assessment: Stable status post transmetatarsal amputation left foot.  Betadine gauze applied to the incision followed by a bulky sterile bandage.  Waiting for results of bone culture and pathology.  Patient will remain nonweightbearing on the left lower extremity.  Hopefully should be stable for discharge in the next couple of days.  LOS: 6 days    [name] 08/05/2022

## 2022-08-05 NOTE — Anesthesia Postprocedure Evaluation (Signed)
Anesthesia Post Note  Patient: Ricardo Brown  Procedure(s) Performed: TRANSMETATARSAL AMPUTATION (Left: Toe)  Patient location during evaluation: PACU Anesthesia Type: MAC Level of consciousness: awake Pain management: pain level controlled Vital Signs Assessment: post-procedure vital signs reviewed and stable Respiratory status: spontaneous breathing and nonlabored ventilation Cardiovascular status: stable Anesthetic complications: no  No notable events documented.   Last Vitals:  Vitals:   08/04/22 2002 08/04/22 2319  BP: 133/67 (!) 150/75  Pulse: 71 80  Resp: 20 20  Temp: 36.7 C 37.1 C  SpO2: 97% 98%    Last Pain:  Vitals:   08/05/22 0144  TempSrc:   PainSc: 7                  VAN STAVEREN,Macyn Shropshire

## 2022-08-05 NOTE — Consult Note (Signed)
Pharmacy Antibiotic Note  Ricardo Brown is a 62 y.o. male admitted on 07/30/2022 with  osteomyelitis of left foot .  Pharmacy has been consulted for vancomycin and Zosyn dosing.  12/28: Patient got Zosyn 3.375 g @ 1858, vancomycin 1 gram @ 1925  Plan: Continue zosyn 3.375 grams IV every 8 hours Pt received vancomycin 1500 mg x 1 followed by 1250 mg q24H. Will adjust dose to 750 mg IV BID. Predicted AUC 417. Goal AUC 400-550. Scr 1.29 (improving), Vd 0.72 (borderline), IBW for calc. Plan to order vancomycin level after 4th or 5th dose.    1/2:  Vanc trough @ 0147 = 10 1/3:  Vanc peak @   1824 = 13   Calculated AUC = 286.1   Will increase dose to Vancomycin 1250 mg IV Q12H to start on 1/3 @ 1500.   Will draw next peak and trough around third level.  1/4 @ 1800 Vanc peak 1/5 @ 0230 Vanc trough   Height: '5\' 8"'$  (172.7 cm) Weight: 94.8 kg (208 lb 15.9 oz) IBW/kg (Calculated) : 68.4  Temp (24hrs), Avg:98.1 F (36.7 C), Min:97.8 F (36.6 C), Max:98.8 F (37.1 C)  Recent Labs  Lab 07/30/22 1435 07/31/22 1445 08/01/22 0930 08/02/22 0424 08/03/22 0336 08/04/22 0333 08/04/22 1824 08/05/22 0147 08/05/22 0319  WBC 9.4  --   --   --   --   --   --   --  10.6*  CREATININE 1.57* 1.40* 1.29* 1.51* 1.28* 1.05  --   --  1.22  LATICACIDVEN 2.1* 0.9  --   --   --   --   --   --   --   VANCOTROUGH  --   --   --   --   --   --   --  10*  --   VANCOPEAK  --   --   --   --   --   --  13*  --   --      Estimated Creatinine Clearance: 71 mL/min (by C-G formula based on SCr of 1.22 mg/dL).    Allergies  Allergen Reactions   Lipitor [Atorvastatin] Palpitations    SVT   Sacubitril-Valsartan Other (See Comments)    SVT Other reaction(s): Other (see comments) Other reaction(s): Other (See Comments) SVT SVT   Pregabalin Palpitations    SVT Other reaction(s): Other (see comments) SVT SVT    Antimicrobials this admission: Vancomycin 12/28 >>  Zosyn 12/28 >>   Dose  adjustments this admission: N/A  Microbiology results: 12/28 BCx: ngtd  Thank you for allowing pharmacy to be a part of this patient's care.  [name] 08/05/2022 4:28 AM

## 2022-08-05 NOTE — Progress Notes (Signed)
[facility]                                                                               Ricardo Brown, is a 62 y.o. male, DOB - 02/18/1961, [id number] Admit date - 07/30/2022    Outpatient Primary MD for the patient is [name], MD  LOS - 6  days    Brief summary   62 year old male with past medical history of diabetes mellitus, poorly controlled, CAD status post CABG, PAD, chronic systolic/diastolic heart failure and obesity who was hospitalized 2 months ago for abscess of the first second interspace of foot and osteomyelitis of second MTP joint status post I&D who was sent to the emergency room on 12/28 for concerns of recurrent osteomyelitis.  Admitted to the hospitalist service.  MRI done noted osteomyelitis in the left fourth toe as well as second toe and metatarsal.  Started on IV antibiotics and podiatry took patient to the OR on 1/2 for transmetatarsal amputation.   Assessment & Plan    Assessment and Plan: * Osteomyelitis of left foot (HCC) Recurrent.  Status post transmetatarsal amputation 1/2.  Continue antibiotics until pathology has come back.  Acute blood loss anemia Secondary to surgery.  Not present on admission.  Will continue to follow.   Acute renal failure superimposed on stage 2 chronic kidney disease ([place]) Treated with IV fluids.  Creatinine at 1.05 on 1/2, but creatinine with slight increase on 1/3, likely in part due to blood loss.  Continue to monitor.  Chronic combined systolic (congestive) and diastolic (congestive) heart failure ([place]) -2D echo 5/23 had shown EF of 45 to 50% Continue losartan.  BNP mildly evaded at 386.  Will give 1 dose of Lasix.  Type 2 diabetes mellitus with diabetic chronic kidney disease ([place]) CBG (last 3)  Recent Labs    08/04/22 2058 08/05/22 0809 08/05/22 1231  GLUCAP 245* 139* 150*    Resume SSI.    PAD (peripheral artery disease) ([place]) History of left lower extremity angiogram on  11/6 Plavix last given on Friday, 12/29. Continue rosuvastatin  Hypertension BP stable,well controlled.   continue metoprolol, and losartan  Coronary artery disease No complaints of chest pain, EKG nonacute Continue rosuvastatin, aspirin and metoprolol.   Holding Plavix for procedure  Obesity: Patient meets criteria with BMI greater than 30 Estimated body mass index is 31.78 kg/m as calculated from the following:   Height as of this encounter: '5\' 8"'$  (1.727 m).   Weight as of this encounter: 94.8 kg.  Code Status: full code.  DVT Prophylaxis:  SCDs Start: 07/30/22 1940   Level of Care: Level of care: Med-Surg Family Communication: Wife at bedside  Disposition Plan:     Remains inpatient appropriate:   -Pathology back and determination of further antibiotics needed  Procedures:  Status post transmetatarsal amputation  Consultants:   PODIATRY.   Antimicrobials:   Anti-infectives (From admission, onward)    Start     Dose/Rate Route Frequency Ordered Stop   08/05/22 1500  vancomycin (VANCOREADY) IVPB 1250 mg/250 mL  Status:  Discontinued        1,250 mg 166.7 mL/hr over 90 Minutes Intravenous Every 12  hours 08/05/22 0432 08/05/22 0751   08/05/22 1500  vancomycin (VANCOREADY) IVPB 750 mg/150 mL        750 mg 150 mL/hr over 60 Minutes Intravenous Every 12 hours 08/05/22 0751     08/04/22 1400  vancomycin (VANCOCIN) IVPB 750 mg/150 ml premix  Status:  Discontinued        750 mg 150 mL/hr over 60 Minutes Intravenous Every 12 hours 08/04/22 1329 08/05/22 0432   08/04/22 1312  piperacillin-tazobactam (ZOSYN) 3.375 GM/50ML IVPB       Note to Pharmacy: Maryagnes Amos B: cabinet override      08/04/22 1312 08/04/22 1340   08/02/22 1400  vancomycin (VANCOREADY) IVPB 750 mg/150 mL  Status:  Discontinued        750 mg 150 mL/hr over 60 Minutes Intravenous Every 12 hours 08/02/22 1234 08/04/22 1329   08/01/22 1300  vancomycin (VANCOREADY) IVPB 1250 mg/250 mL  Status:   Discontinued        1,250 mg 166.7 mL/hr over 90 Minutes Intravenous Every 24 hours 07/31/22 1618 08/02/22 1234   07/31/22 2200  piperacillin-tazobactam (ZOSYN) IVPB 3.375 g        3.375 g 12.5 mL/hr over 240 Minutes Intravenous Every 8 hours 07/31/22 1445     07/31/22 1100  piperacillin-tazobactam (ZOSYN) IVPB 3.375 g        3.375 g 100 mL/hr over 30 Minutes Intravenous  Once 07/31/22 1046 07/31/22 1641   07/31/22 1100  vancomycin (VANCOREADY) IVPB 1500 mg/300 mL        1,500 mg 150 mL/hr over 120 Minutes Intravenous  Once 07/31/22 1056 07/31/22 1641   07/30/22 1745  vancomycin (VANCOCIN) IVPB 1000 mg/200 mL premix        1,000 mg 200 mL/hr over 60 Minutes Intravenous  Once 07/30/22 1743 07/30/22 2054   07/30/22 1745  piperacillin-tazobactam (ZOSYN) IVPB 3.375 g        3.375 g 100 mL/hr over 30 Minutes Intravenous  Once 07/30/22 1743 07/30/22 2028        Medications  Scheduled Meds:  gabapentin  100 mg Oral BID   insulin aspart  0-15 Units Subcutaneous TID WC   insulin aspart  0-5 Units Subcutaneous QHS   losartan  25 mg Oral Daily   metoprolol succinate  50 mg Oral Daily   pramipexole  1.5 mg Oral QHS   rosuvastatin  20 mg Oral Daily   Continuous Infusions:  piperacillin-tazobactam (ZOSYN)  IV 3.375 g (08/05/22 0531)   vancomycin     PRN Meds:.acetaminophen **OR** acetaminophen, HYDROcodone-acetaminophen, morphine injection, ondansetron **OR** ondansetron (ZOFRAN) IV    Subjective:   Seen before surgery, no complaints  Objective:   Vitals:   08/04/22 1521 08/04/22 2002 08/04/22 2319 08/05/22 0839  BP: (!) 147/77 133/67 (!) 150/75 (!) 108/57  Pulse: (!) 59 71 80 73  Resp: '18 20 20 16  '$ Temp: 97.8 F (36.6 C) 98 F (36.7 C) 98.8 F (37.1 C) 98.8 F (37.1 C)  TempSrc:      SpO2: 100% 97% 98% 98%  Weight:      Height:        Intake/Output Summary (Last 24 hours) at 08/05/2022 1300 Last data filed at 08/05/2022 1234 Gross per 24 hour  Intake 2916.03 ml   Output 1551 ml  Net 1365.03 ml    Filed Weights   07/30/22 1427 08/04/22 1140  Weight: 94.8 kg 94.8 kg     Exam General: Alert and oriented x 3, NAD Left foot  wrapped, in boot   Data Reviewed:  I have personally reviewed following labs and imaging studies Creatinine of 1.22, BNP of 385.   Radiology Studies: No results found.    Annita Brod M.D. [facility] 08/05/2022, 1:00 PM  Available via Epic secure chat 7am-7pm After 7 pm, please refer to night coverage provider listed on amion.

## 2022-08-06 DIAGNOSIS — D62 Acute posthemorrhagic anemia: Secondary | ICD-10-CM | POA: Diagnosis not present

## 2022-08-06 DIAGNOSIS — L089 Local infection of the skin and subcutaneous tissue, unspecified: Secondary | ICD-10-CM | POA: Diagnosis not present

## 2022-08-06 DIAGNOSIS — E1151 Type 2 diabetes mellitus with diabetic peripheral angiopathy without gangrene: Secondary | ICD-10-CM

## 2022-08-06 DIAGNOSIS — N179 Acute kidney failure, unspecified: Secondary | ICD-10-CM | POA: Diagnosis not present

## 2022-08-06 DIAGNOSIS — M8618 Other acute osteomyelitis, other site: Secondary | ICD-10-CM | POA: Diagnosis not present

## 2022-08-06 DIAGNOSIS — E11628 Type 2 diabetes mellitus with other skin complications: Secondary | ICD-10-CM | POA: Diagnosis not present

## 2022-08-06 DIAGNOSIS — E1169 Type 2 diabetes mellitus with other specified complication: Secondary | ICD-10-CM | POA: Diagnosis not present

## 2022-08-06 DIAGNOSIS — I5042 Chronic combined systolic (congestive) and diastolic (congestive) heart failure: Secondary | ICD-10-CM | POA: Diagnosis not present

## 2022-08-06 DIAGNOSIS — M86172 Other acute osteomyelitis, left ankle and foot: Secondary | ICD-10-CM | POA: Diagnosis not present

## 2022-08-06 LAB — BASIC METABOLIC PANEL
Anion gap: 6 (ref 5–15)
BUN: 29 mg/dL — ABNORMAL HIGH (ref 8–23)
CO2: 21 mmol/L — ABNORMAL LOW (ref 22–32)
Calcium: 7.8 mg/dL — ABNORMAL LOW (ref 8.9–10.3)
Chloride: 107 mmol/L (ref 98–111)
Creatinine, Ser: 1.65 mg/dL — ABNORMAL HIGH (ref 0.61–1.24)
GFR, Estimated: 47 mL/min — ABNORMAL LOW (ref >60)
Glucose, Bld: 178 mg/dL — ABNORMAL HIGH (ref 70–99)
Potassium: 3.9 mmol/L (ref 3.5–5.1)
Sodium: 134 mmol/L — ABNORMAL LOW (ref 135–145)

## 2022-08-06 LAB — CBC
HCT: 27.6 % — ABNORMAL LOW (ref 39.0–52.0)
Hemoglobin: 8.6 g/dL — ABNORMAL LOW (ref 13.0–17.0)
MCH: 28.6 pg (ref 26.0–34.0)
MCHC: 31.2 g/dL (ref 30.0–36.0)
MCV: 91.7 fL (ref 80.0–100.0)
Platelets: 236 10*3/uL (ref 150–400)
RBC: 3.01 MIL/uL — ABNORMAL LOW (ref 4.22–5.81)
RDW: 14.2 % (ref 11.5–15.5)
WBC: 9.6 10*3/uL (ref 4.0–10.5)
nRBC: 0 % (ref 0.0–0.2)

## 2022-08-06 LAB — VANCOMYCIN, PEAK: Vancomycin Pk: 12 ug/mL — ABNORMAL LOW (ref 30–40)

## 2022-08-06 LAB — GLUCOSE, CAPILLARY
Glucose-Capillary: 183 mg/dL — ABNORMAL HIGH (ref 70–99)
Glucose-Capillary: 180 mg/dL — ABNORMAL HIGH (ref 70–99)
Glucose-Capillary: 187 mg/dL — ABNORMAL HIGH (ref 70–99)
Glucose-Capillary: 220 mg/dL — ABNORMAL HIGH (ref 70–99)

## 2022-08-06 MED ORDER — SODIUM CHLORIDE 0.9 % IV SOLN
3.0000 g | Freq: Four times a day (QID) | INTRAVENOUS | Status: DC
  Administered 2022-08-06 – 2022-08-10 (×14): 3 g via INTRAVENOUS
  Filled 2022-08-06 (×3): qty 8
  Filled 2022-08-06 (×3): qty 3
  Filled 2022-08-06 (×3): qty 8
  Filled 2022-08-06 (×2): qty 3
  Filled 2022-08-06 (×3): qty 8
  Filled 2022-08-06: qty 3
  Filled 2022-08-06: qty 8

## 2022-08-06 MED ORDER — ASPIRIN 81 MG PO TBEC
81.0000 mg | DELAYED_RELEASE_TABLET | Freq: Every day | ORAL | Status: DC
  Administered 2022-08-06 – 2022-08-11 (×6): 81 mg via ORAL
  Filled 2022-08-06 (×6): qty 1

## 2022-08-06 MED ORDER — ORAL CARE MOUTH RINSE: 15.0000 mL | OROMUCOSAL | Status: DC | PRN

## 2022-08-06 MED ORDER — VANCOMYCIN VARIABLE DOSE PER UNSTABLE RENAL FUNCTION (PHARMACIST DOSING): Status: DC

## 2022-08-06 MED ORDER — AMIODARONE HCL 200 MG PO TABS
200.0000 mg | ORAL_TABLET | Freq: Every day | ORAL | Status: DC
  Administered 2022-08-06 – 2022-08-11 (×6): 200 mg via ORAL
  Filled 2022-08-06 (×6): qty 1

## 2022-08-06 NOTE — Consult Note (Signed)
NAME: Ricardo Brown  DOB: June 10, 1961  MRN: [id number]  Date/Time: 08/06/2022 8:18 PM  REQUESTING PROVIDER: Dr.Cline Subjective:  REASON FOR CONSULT: left foot infection ? Ricardo Brown is a 62 y.o. with a History of diabetes mellitus, CAD status post CABG,mitral valve repair, PVD. multiple amputation of toes of both feet because of, neurotrophic ulcers and gangrene presented to the hospital on 07/30/2022 with concerns for recurrent wound  infection and osteomyelitis of the left foot.  Has had extensive surgery to both feet. 06/05/2022 underwent left foot incision and drainage for foot abscess in the first interspace/second metatarsal phalangeal joint area and left second metatarsal phalangeal joint bone biopsy and bone culture.  Culture was negative. Was prescribed doxycycline  12/20/2021 underwent left second toe partial amputation and culture was positive for Staph aureus, group B strep and Acinetobacter  August 29, 2021 underwent amputation of the right second toe metatarsal phalangeal joint for gangrene with osteomyelitis culture was positive for Staph aureus, Enterobacter cloacae and Streptococcus agalactiae, Prevotella and Pasteurella canis.  Treated with oral antibiotics.  This time in the ED his vitals were BP of 128/72, temp 98.2, heart rate 77, respiratory 20.  WBC 9.4, Hb 12, platelet 302, creatinine 1.57. Blood culture was sent.  MRI of the left foot showed significant progression of septic arthritis of the second MTP joint with extensive osteomyelitis involving the second metatarsal head and neck and also the proximal phalanx.  There was surrounding fluid collection consistent with abscess. There was septic arthritis at the proximal interphalangeal and of the fourth toe with extensive osteomyelitis involving the proximal and middle phalanges.  He was started on Zosyn and vancomycin. On 08/04/2022 underwent transmetatarsal amputation of the left foot. The culture is  growing Staph aureus I am asked to see the patient for management of his antibiotics.  Pathology is pending.   Past Medical History:  Diagnosis Date   Chronic combined systolic (congestive) and diastolic (congestive) heart failure ([place])    a. 01/2018 EF 35-40%; b. 10/2020 Echo: EF 40-45%, glob HK. RVSP 42.69mHg. Mildly dil LA. Mild MS/AoV sclerosis.   CKD (chronic kidney disease), stage Brown (HCC)    Coronary artery disease    a. 01/2018 late presenting inferior MI; b. 01/2018 Cath: Severe multivessel dzs-->CABG x 3 (LIMA->LAD, VG->OM2, VG->RPDA @ [facility]).   Diabetes mellitus without complication ([facility]    a. Dx ~ 2000.   Heart palpitations    a. Pt reports prior nl echo's and stress tests. Last stress test w/in past 2 yrs - PCP.   High cholesterol    Hypertension    Mitral regurgitation    a. 01/2018 s/p MV repair @ time of CABG.   Myocardial infarction ([facility]    Postoperative atrial fibrillation    a. 01/2018 @ time of CABG.   PSVT (paroxysmal supraventricular tachycardia)    a. Very symptomatic with multiple ED evaluations.  Has been on amio.   Severe sepsis with acute organ dysfunction ([facility] 12/19/2021   Sleep difficulties 09/07/2019   STEMI (ST elevation myocardial infarction) ([place] 01/24/2018    Past Surgical History:  Procedure Laterality Date   AMPUTATION TOE Right 08/29/2021   Procedure: AMPUTATION TOE-2nd toe;  Surgeon: CSharlotte Alamo DPM;  Location: [facility] OR[facility];  Service: Podiatry;  Laterality: Right;   AMPUTATION TOE Left 12/22/2021   Procedure: AMPUTATION TOE-2nd Toe Partial;  Surgeon: BCaroline More DPM;  Location: [facility];  Service: Podiatry;  Laterality: Left;   CARDIAC CATHETERIZATION  CARDIAC VALVE REPLACEMENT     Mitral Valve Repair   COLONOSCOPY WITH PROPOFOL N/A 06/07/2019   Procedure: COLONOSCOPY WITH PROPOFOL;  Surgeon: [name], MD;  Location: [facility] ENDOSCOPY;  Service: Endoscopy;  Laterality: N/A;   COLONOSCOPY WITH PROPOFOL N/A 02/07/2020   Procedure:  COLONOSCOPY WITH PROPOFOL;  Surgeon: [name], MD;  Location: [facility] ENDOSCOPY;  Service: Endoscopy;  Laterality: N/A;   COLONOSCOPY WITH PROPOFOL N/A 12/11/2020   Procedure: COLONOSCOPY WITH PROPOFOL;  Surgeon: [name], MD;  Location: [facility] ENDOSCOPY;  Service: Endoscopy;  Laterality: N/A;   CORONARY ANGIOPLASTY     CORONARY ARTERY BYPASS GRAFT     x 3   01/2018   ESOPHAGOGASTRODUODENOSCOPY N/A 02/07/2020   Procedure: ESOPHAGOGASTRODUODENOSCOPY (EGD);  Surgeon: [name], MD;  Location: [facility] ENDOSCOPY;  Service: Endoscopy;  Laterality: N/A;   INCISION AND DRAINAGE Left 06/05/2022   Procedure: INCISION AND DRAINAGE;  Surgeon: Caroline More, DPM;  Location: [facility];  Service: Podiatry;  Laterality: Left;   LEFT HEART CATH AND CORONARY ANGIOGRAPHY N/A 01/24/2018   Procedure: LEFT HEART CATH AND CORONARY ANGIOGRAPHY;  Surgeon: [name], MD;  Location: [facility];  Service: Cardiovascular;  Laterality: N/A;   LOWER EXTREMITY ANGIOGRAPHY Left 06/08/2022   Procedure: Lower Extremity Angiography;  Surgeon: Algernon Huxley, MD;  Location: [facility];  Service: Cardiovascular;  Laterality: Left;   SVT ABLATION N/A 09/12/2021   Procedure: SVT ABLATION;  Surgeon: Vickie Epley, MD;  Location: [facility];  Service: Cardiovascular;  Laterality: N/A;   TONSILLECTOMY     TRANSMETATARSAL AMPUTATION Left 08/04/2022   Procedure: TRANSMETATARSAL AMPUTATION;  Surgeon: Sharlotte Alamo, DPM;  Location: [facility];  Service: Podiatry;  Laterality: Left;    Social History   Socioeconomic History   Marital status: Married    Spouse name: Not on file   Number of children: Not on file   Years of education: Not on file   Highest education level: Not on file  Occupational History   Not on file  Tobacco Use   Smoking status: Never   Smokeless tobacco: Never  Vaping Use   Vaping Use: Never used  Substance and Sexual Activity   Alcohol use: Not Currently     Comment: rare beer   Drug use: No   Sexual activity: Not on file  Other Topics Concern   Not on file  Social History Narrative   Lives locally with wife.  Works in [employer].  Does not routinely exercise.   Social Determinants of Health   Financial Resource Strain: Not on file  Food Insecurity: No Food Insecurity (07/31/2022)   Hunger Vital Sign    Worried About Running Out of Food in the Last Year: Never true    Ran Out of Food in the Last Year: Never true  Recent Concern: Food Insecurity - Food Insecurity Present (06/02/2022)   Hunger Vital Sign    Worried About Running Out of Food in the Last Year: Sometimes true    Ran Out of Food in the Last Year: Sometimes true  Transportation Needs: No Transportation Needs (07/31/2022)   PRAPARE - Hydrologist (Medical): No    Lack of Transportation (Non-Medical): No  Physical Activity: Not on file  Stress: Not on file  Social Connections: Not on file  Intimate Partner Violence: Not At Risk (07/31/2022)   Humiliation, Afraid, Rape, and Kick questionnaire    Fear of Current  or Ex-Partner: No    Emotionally Abused: No    Physically Abused: No    Sexually Abused: No    Family History  Problem Relation Age of Onset   Cancer Mother        died @ 33   CAD Father        First MI @ 54. S/p heart transplant. Died in mid-50's of cancer.   Cancer Father    Other Brother        alive and well   Allergies  Allergen Reactions   Lipitor [Atorvastatin] Palpitations    SVT   Sacubitril-Valsartan Other (See Comments)    SVT Other reaction(s): Other (see comments) Other reaction(s): Other (See Comments) SVT SVT   Pregabalin Palpitations    SVT Other reaction(s): Other (see comments) SVT SVT   I? Current Facility-Administered Medications  Medication Dose Route Frequency Provider Last Rate Last Admin   acetaminophen (TYLENOL) tablet 650 mg  650 mg Oral Q6H PRN Sharlotte Alamo, DPM       Or    acetaminophen (TYLENOL) suppository 650 mg  650 mg Rectal Q6H PRN Sharlotte Alamo, DPM       amiodarone (PACERONE) tablet 200 mg  200 mg Oral Daily Annita Brod, MD   200 mg at 08/06/22 1222   [START ON 08/07/2022] Ampicillin-Sulbactam (UNASYN) 3 g in sodium chloride 0.9 % 100 mL IVPB  3 g Intravenous Q6H Tsosie Billing, MD       aspirin EC tablet 81 mg  81 mg Oral Daily Annita Brod, MD   81 mg at 08/06/22 1222   clopidogrel (PLAVIX) tablet 75 mg  75 mg Oral Daily Sharlotte Alamo, DPM   75 mg at 08/06/22 1057   enoxaparin (LOVENOX) injection 47.5 mg  0.5 mg/kg Subcutaneous Q24H Sharlotte Alamo, DPM   50 mg at 08/05/22 2210   gabapentin (NEURONTIN) capsule 100 mg  100 mg Oral BID Sharlotte Alamo, DPM   100 mg at 08/06/22 1057   HYDROcodone-acetaminophen (NORCO/VICODIN) 5-325 MG per tablet 1-2 tablet  1-2 tablet Oral Q4H PRN Sharlotte Alamo, DPM   2 tablet at 08/06/22 1851   insulin aspart (novoLOG) injection 0-15 Units  0-15 Units Subcutaneous TID WC Sharlotte Alamo, DPM   3 Units at 08/06/22 1736   insulin aspart (novoLOG) injection 0-5 Units  0-5 Units Subcutaneous QHS Sharlotte Alamo, DPM   2 Units at 08/04/22 2231   losartan (COZAAR) tablet 25 mg  25 mg Oral Daily Sharlotte Alamo, DPM   25 mg at 08/06/22 1057   metoprolol succinate (TOPROL-XL) 24 hr tablet 50 mg  50 mg Oral Daily Sharlotte Alamo, DPM   50 mg at 08/06/22 1057   morphine (PF) 2 MG/ML injection 2 mg  2 mg Intravenous Q2H PRN Sharlotte Alamo, DPM   2 mg at 08/05/22 0529   ondansetron (ZOFRAN) tablet 4 mg  4 mg Oral Q6H PRN Sharlotte Alamo, DPM       Or   ondansetron [facility]) injection 4 mg  4 mg Intravenous Q6H PRN Sharlotte Alamo, DPM       Oral care mouth rinse  15 mL Mouth Rinse PRN Annita Brod, MD       pramipexole (MIRAPEX) tablet 1.5 mg  1.5 mg Oral QHS Sharlotte Alamo, DPM   1.5 mg at 08/05/22 2211   rosuvastatin (CRESTOR) tablet 20 mg  20 mg Oral Daily Sharlotte Alamo, DPM   20 mg at 08/06/22 1057   vancomycin variable dose per unstable  renal function  (pharmacist dosing)   Does not apply See admin instructions Benita Gutter, RPH         Abtx:  Anti-infectives (From admission, onward)    Start     Dose/Rate Route Frequency Ordered Stop   08/07/22 0000  Ampicillin-Sulbactam (UNASYN) 3 g in sodium chloride 0.9 % 100 mL IVPB        3 g 200 mL/hr over 30 Minutes Intravenous Every 6 hours 08/06/22 1523     08/06/22 0745  vancomycin variable dose per unstable renal function (pharmacist dosing)         Does not apply See admin instructions 08/06/22 0745     08/05/22 1500  vancomycin (VANCOREADY) IVPB 1250 mg/250 mL  Status:  Discontinued        1,250 mg 166.7 mL/hr over 90 Minutes Intravenous Every 12 hours 08/05/22 0432 08/05/22 0751   08/05/22 1500  vancomycin (VANCOREADY) IVPB 750 mg/150 mL  Status:  Discontinued        750 mg 150 mL/hr over 60 Minutes Intravenous Every 12 hours 08/05/22 0751 08/06/22 0745   08/04/22 1400  vancomycin (VANCOCIN) IVPB 750 mg/150 ml premix  Status:  Discontinued        750 mg 150 mL/hr over 60 Minutes Intravenous Every 12 hours 08/04/22 1329 08/05/22 0432   08/04/22 1312  piperacillin-tazobactam (ZOSYN) 3.375 GM/50ML IVPB       Note to Pharmacy: Maryagnes Amos B: cabinet override      08/04/22 1312 08/05/22 1810   08/02/22 1400  vancomycin (VANCOREADY) IVPB 750 mg/150 mL  Status:  Discontinued        750 mg 150 mL/hr over 60 Minutes Intravenous Every 12 hours 08/02/22 1234 08/04/22 1329   08/01/22 1300  vancomycin (VANCOREADY) IVPB 1250 mg/250 mL  Status:  Discontinued        1,250 mg 166.7 mL/hr over 90 Minutes Intravenous Every 24 hours 07/31/22 1618 08/02/22 1234   07/31/22 2200  piperacillin-tazobactam (ZOSYN) IVPB 3.375 g  Status:  Discontinued        3.375 g 12.5 mL/hr over 240 Minutes Intravenous Every 8 hours 07/31/22 1445 08/06/22 1523   07/31/22 1100  piperacillin-tazobactam (ZOSYN) IVPB 3.375 g        3.375 g 100 mL/hr over 30 Minutes Intravenous  Once 07/31/22 1046 07/31/22 1641    07/31/22 1100  vancomycin (VANCOREADY) IVPB 1500 mg/300 mL        1,500 mg 150 mL/hr over 120 Minutes Intravenous  Once 07/31/22 1056 07/31/22 1641   07/30/22 1745  vancomycin (VANCOCIN) IVPB 1000 mg/200 mL premix        1,000 mg 200 mL/hr over 60 Minutes Intravenous  Once 07/30/22 1743 07/30/22 2054   07/30/22 1745  piperacillin-tazobactam (ZOSYN) IVPB 3.375 g        3.375 g 100 mL/hr over 30 Minutes Intravenous  Once 07/30/22 1743 07/30/22 2028       REVIEW OF SYSTEMS:  Const: negative fever, negative chills, negative weight loss Eyes: negative diplopia or visual changes, negative eye pain ENT: negative coryza, negative sore throat Resp: negative cough, hemoptysis, dyspnea Cards: negative for chest pain, palpitations, lower extremity edema GU: negative for frequency, dysuria and hematuria GI: Negative for abdominal pain, diarrhea, bleeding, constipation Skin: negative for rash and pruritus Heme: negative for easy bruising and gum/nose bleeding MS: negative for myalgias, arthralgias, back pain and muscle weakness Neurolo:negative for headaches, dizziness, vertigo, memory problems  Psych: negative for feelings of anxiety, depression  Endocrine: , diabetes Allergy/Immunology- as above Objective:  VITALS:  BP 123/66 (BP Location: Left Arm)   Pulse 60   Temp 98.4 F (36.9 C)   Resp 16   Ht '5\' 8"'$  (1.727 m)   Wt 94.8 kg   SpO2 100%   BMI 31.78 kg/m   PHYSICAL EXAM:  General: Alert, cooperative, no distress, appears stated age.  Head: Normocephalic, without obvious abnormality, atraumatic. Eyes: Conjunctivae clear, anicteric sclerae. Pupils are equal ENT Nares normal. No drainage or sinus tenderness. Lips, mucosa, and tongue normal. No Thrush Neck: Supple, symmetrical, no adenopathy, thyroid: non tender no carotid bruit and no JVD. Back: No CVA tenderness. Lungs: Clear to auscultation bilaterally. No Wheezing or Rhonchi. No rales. Heart: Regular rate and rhythm, no  murmur, rub or gallop. Abdomen: Soft, non-tender,not distended. Bowel sounds normal. No masses Extremities:left foot surgical dressing pictures reviewed TMA site looks clean- look like therapeutic amputation     Pre surgery     Skin: No rashes or lesions. Or bruising Lymph: Cervical, supraclavicular normal. Neurologic: Grossly non-focal Pertinent Labs Lab Results CBC    Component Value Date/Time   WBC 9.6 08/06/2022 0115   RBC 3.01 (L) 08/06/2022 0115   HGB 8.6 (L) 08/06/2022 0115   HGB 13.0 08/20/2021 0632   HCT 27.6 (L) 08/06/2022 0115   HCT 38.0 08/20/2021 0632   PLT 236 08/06/2022 0115   PLT 197 08/20/2021 0632   MCV 91.7 08/06/2022 0115   MCV 89 08/20/2021 0632   MCH 28.6 08/06/2022 0115   MCHC 31.2 08/06/2022 0115   RDW 14.2 08/06/2022 0115   RDW 12.9 08/20/2021 0632   LYMPHSABS 1.4 07/30/2022 1435   LYMPHSABS 0.9 08/20/2021 0632   MONOABS 0.7 07/30/2022 1435   EOSABS 0.2 07/30/2022 1435   EOSABS 0.1 08/20/2021 0632   BASOSABS 0.1 07/30/2022 1435   BASOSABS 0.1 08/20/2021 6433       Latest Ref Rng & Units 08/06/2022    1:16 AM 08/05/2022    3:19 AM 08/04/2022    3:33 AM  CMP  Glucose 70 - 99 mg/dL 178  154  200   BUN 8 - 23 mg/dL '29  16  16   '$ Creatinine 0.61 - 1.24 mg/dL 1.65  1.22  1.05   Sodium 135 - 145 mmol/L 134  133  138   Potassium 3.5 - 5.1 mmol/L 3.9  4.1  3.7   Chloride 98 - 111 mmol/L 107  106  108   CO2 22 - 32 mmol/L '21  20  22   '$ Calcium 8.9 - 10.3 mg/dL 7.8  7.8  8.0       Microbiology: Recent Results (from the past 240 hour(s))  Culture, blood (routine x 2)     Status: None   Collection Time: 07/30/22  6:48 PM   Specimen: BLOOD  Result Value Ref Range Status   Specimen Description BLOOD LEFT ANTECUBITAL  Final   Special Requests   Final    BOTTLES DRAWN AEROBIC AND ANAEROBIC Blood Culture adequate volume   Culture   Final    NO GROWTH 5 DAYS Performed at [facility], [address] [place], [place] [address]     Report Status 08/04/2022 FINAL  Final  Culture, blood (routine x 2)     Status: None   Collection Time: 07/30/22  7:00 PM   Specimen: BLOOD  Result Value Ref Range Status   Specimen Description BLOOD RIGHT ANTECUBITAL  Final   Special Requests  Final    BOTTLES DRAWN AEROBIC AND ANAEROBIC Blood Culture adequate volume   Culture   Final    NO GROWTH 5 DAYS Performed at [facility], [address] [place], [place] [address]    Report Status 08/04/2022 FINAL  Final  Surgical pcr screen     Status: None   Collection Time: 08/03/22 12:47 PM   Specimen: Nasal Mucosa; Nasal Swab  Result Value Ref Range Status   MRSA, PCR NEGATIVE NEGATIVE Final   Staphylococcus aureus NEGATIVE NEGATIVE Final    Comment: (NOTE) The Xpert SA Assay (FDA approved for NASAL specimens in patients 79 years of age and older), is one component of a comprehensive surveillance program. It is not intended to diagnose infection nor to guide or monitor treatment. Performed at [facility], [address] [place], [place] [address]   Aerobic/Anaerobic Culture w Gram Stain (surgical/deep wound)     Status: None (Preliminary result)   Collection Time: 08/04/22  1:24 PM   Specimen: PATH Amputaion Arm/Leg; Tissue  Result Value Ref Range Status   Specimen Description   Final    WOUND Performed at [facility], [address], [place], [place] [address]    Special Requests   Final    AMP ARM LEG Performed at [facility], [address] [place], [place] [address]    Gram Stain   Final    RARE WBC PRESENT, PREDOMINANTLY PMN NO ORGANISMS SEEN Performed at [facility], [place] [address], [place],  [address]    Culture   Final    RARE STAPHYLOCOCCUS AUREUS SUSCEPTIBILITIES TO FOLLOW NO ANAEROBES ISOLATED; CULTURE IN PROGRESS FOR 5 DAYS    Report Status PENDING  Incomplete    IMAGING RESULTS: MRI foot reviewed- osteomyelitis of 2 nd toe and 4 toe I have  personally reviewed the films ? Impression/Recommendation ? Diabetic left foot infection with neuropathy and peripheral arterial disease.  Had an infection in November and it was drained and he was on p.o. doxycycline and was followed by podiatrist until it got worse. Underwent left TMA on 08/04/2022 Cultures so far is Staph aureus Pathology is pending Patient is currently on vancomycin and Zosyn.  Will discontinue Zosyn Will change to Unasyn If no MRSA we can stop vanco Looks like therapeutic amputation and may be okay to give PO antibiotic when ready for discharge  Diabetes mellitus  History of CAD status post CABG  History of mitral valve repair  PAD with history of balloon angioplasty of the left tibioperoneal trunk, left anterior tibial artery, left posterior tibial artery in NOV 2023  Discussed with patient, requesting provider Note:  This document was prepared using Dragon voice recognition software and may include unintentional dictation errors.

## 2022-08-06 NOTE — Progress Notes (Signed)
[facility]                                                                               Ricardo Brown, is a 62 y.o. male, DOB - [birth year], VVZ:[id number] Admit date - 07/30/2022    Outpatient Primary MD for the patient is [name], MD  LOS - 7  days    Brief summary   62 year old male with past medical history of diabetes mellitus, poorly controlled, CAD status post CABG, PAD, chronic systolic/diastolic heart failure and obesity who was hospitalized 2 months ago for abscess of the first second interspace of foot and osteomyelitis of second MTP joint status post I&D who was sent to the emergency room on 12/28 for concerns of recurrent osteomyelitis.  Admitted to the hospitalist service.  MRI done noted osteomyelitis in the left fourth toe as well as second toe and metatarsal.  Started on IV antibiotics and podiatry took patient to the OR on 1/2 for transmetatarsal amputation.   Assessment & Plan    Assessment and Plan: * Osteomyelitis of left foot (HCC) Recurrent.  Status post transmetatarsal amputation 1/2.  Continue antibiotics until pathology has come back.  Preliminary notes Staph aureus.  Infectious disease consulted.  On vancomycin and Zosyn.  Acute blood loss anemia Secondary to surgery.  Not present on admission.  Will continue to follow.  Hemoglobin at 8.6 today.   Acute renal failure superimposed on stage 3a chronic kidney disease ([place]) Treated with IV fluids.  Creatinine at 1.05 on 1/2, but has had steady increase.  In part due to 1 dose of Lasix on 1/3.  Chronic combined systolic (congestive) and diastolic (congestive) heart failure ([place]) -2D echo 5/23 had shown EF of 45 to 50% Continue losartan.  BNP mildly evaded at 386.  Will give 1 dose of Lasix.  Type 2 diabetes mellitus with diabetic chronic kidney disease (HCC) CBG (last 3)  Recent Labs    08/05/22 2200 08/06/22 0829 08/06/22 1136  GLUCAP 189* 183* 180*    Resume SSI.     PAD (peripheral artery disease) ([place]) History of left lower extremity angiogram on 11/6 Plavix last given on Friday, 12/29. Continue rosuvastatin  Hypertension BP stable,well controlled.   continue metoprolol, and losartan  Coronary artery disease No complaints of chest pain, EKG nonacute Continue rosuvastatin, aspirin and metoprolol.   Holding Plavix for procedure  Obesity: Patient meets criteria with BMI greater than 30 Estimated body mass index is 31.78 kg/m as calculated from the following:   Height as of this encounter: '5\' 8"'$  (1.727 m).   Weight as of this encounter: 94.8 kg.  Code Status: full code.  DVT Prophylaxis:  SCDs Start: 07/30/22 1940   Level of Care: Level of care: Med-Surg Family Communication: Will call wife  Disposition Plan:     Remains inpatient appropriate:   -Pathology back and determination of further antibiotics needed  Procedures:  Status post transmetatarsal amputation  Consultants:   PODIATRY.   Antimicrobials:   Anti-infectives (From admission, onward)    Start     Dose/Rate Route Frequency Ordered Stop   08/06/22 0745  vancomycin variable dose per unstable renal function (pharmacist dosing)  Does not apply See admin instructions 08/06/22 0745     08/05/22 1500  vancomycin (VANCOREADY) IVPB 1250 mg/250 mL  Status:  Discontinued        1,250 mg 166.7 mL/hr over 90 Minutes Intravenous Every 12 hours 08/05/22 0432 08/05/22 0751   08/05/22 1500  vancomycin (VANCOREADY) IVPB 750 mg/150 mL  Status:  Discontinued        750 mg 150 mL/hr over 60 Minutes Intravenous Every 12 hours 08/05/22 0751 08/06/22 0745   08/04/22 1400  vancomycin (VANCOCIN) IVPB 750 mg/150 ml premix  Status:  Discontinued        750 mg 150 mL/hr over 60 Minutes Intravenous Every 12 hours 08/04/22 1329 08/05/22 0432   08/04/22 1312  piperacillin-tazobactam (ZOSYN) 3.375 GM/50ML IVPB       Note to Pharmacy: Maryagnes Amos B: cabinet override      08/04/22  1312 08/05/22 1810   08/02/22 1400  vancomycin (VANCOREADY) IVPB 750 mg/150 mL  Status:  Discontinued        750 mg 150 mL/hr over 60 Minutes Intravenous Every 12 hours 08/02/22 1234 08/04/22 1329   08/01/22 1300  vancomycin (VANCOREADY) IVPB 1250 mg/250 mL  Status:  Discontinued        1,250 mg 166.7 mL/hr over 90 Minutes Intravenous Every 24 hours 07/31/22 1618 08/02/22 1234   07/31/22 2200  piperacillin-tazobactam (ZOSYN) IVPB 3.375 g        3.375 g 12.5 mL/hr over 240 Minutes Intravenous Every 8 hours 07/31/22 1445     07/31/22 1100  piperacillin-tazobactam (ZOSYN) IVPB 3.375 g        3.375 g 100 mL/hr over 30 Minutes Intravenous  Once 07/31/22 1046 07/31/22 1641   07/31/22 1100  vancomycin (VANCOREADY) IVPB 1500 mg/300 mL        1,500 mg 150 mL/hr over 120 Minutes Intravenous  Once 07/31/22 1056 07/31/22 1641   07/30/22 1745  vancomycin (VANCOCIN) IVPB 1000 mg/200 mL premix        1,000 mg 200 mL/hr over 60 Minutes Intravenous  Once 07/30/22 1743 07/30/22 2054   07/30/22 1745  piperacillin-tazobactam (ZOSYN) IVPB 3.375 g        3.375 g 100 mL/hr over 30 Minutes Intravenous  Once 07/30/22 1743 07/30/22 2028        Medications  Scheduled Meds:  amiodarone  200 mg Oral Daily   aspirin EC  81 mg Oral Daily   clopidogrel  75 mg Oral Daily   enoxaparin (LOVENOX) injection  0.5 mg/kg Subcutaneous Q24H   gabapentin  100 mg Oral BID   insulin aspart  0-15 Units Subcutaneous TID WC   insulin aspart  0-5 Units Subcutaneous QHS   losartan  25 mg Oral Daily   metoprolol succinate  50 mg Oral Daily   pramipexole  1.5 mg Oral QHS   rosuvastatin  20 mg Oral Daily   vancomycin variable dose per unstable renal function (pharmacist dosing)   Does not apply See admin instructions   Continuous Infusions:  piperacillin-tazobactam (ZOSYN)  IV 3.375 g (08/06/22 1449)   PRN Meds:.acetaminophen **OR** acetaminophen, HYDROcodone-acetaminophen, morphine injection, ondansetron **OR**  ondansetron (ZOFRAN) IV, mouth rinse    Subjective:   Pain tolerable with pain medication  Objective:   Vitals:   08/04/22 2319 08/05/22 0839 08/05/22 1714 08/06/22 0832  BP: (!) 150/75 (!) 108/57 123/62 110/61  Pulse: 80 73 73 71  Resp: '20 16 16 16  '$ Temp: 98.8 F (37.1 C) 98.8 F (37.1 C) 98.8  F (37.1 C) 98.3 F (36.8 C)  TempSrc:      SpO2: 98% 98% 96% 100%  Weight:      Height:        Intake/Output Summary (Last 24 hours) at 08/06/2022 1450 Last data filed at 08/06/2022 5809 Gross per 24 hour  Intake 320.7 ml  Output 400 ml  Net -79.3 ml    Filed Weights   07/30/22 1427 08/04/22 1140  Weight: 94.8 kg 94.8 kg     Exam General: Alert and oriented x 3, NAD Cardiovascular: Regular rate and rhythm, S1-S2 Lungs: Clear to auscultation bilaterally Left foot wrapped   Data Reviewed:  I have personally reviewed following labs and imaging studies Creatinine of 1.65, hemoglobin of 8.6   Radiology Studies: No results found.    Annita Brod M.D. [facility] 08/06/2022, 2:50 PM  Available via Epic secure chat 7am-7pm After 7 pm, please refer to night coverage provider listed on amion.

## 2022-08-06 NOTE — Consult Note (Addendum)
Pharmacy Antibiotic Note  Ricardo Brown is a 62 y.o. male admitted on 07/30/2022 with  osteomyelitis of left foot . Podiatry is following and patient underwent transmetatarsal amputation of left foot on 08/04/22. Pharmacy has been consulted for vancomycin and Zosyn dosing.  Plan is for ID consultation 1/4. Scr bump 1/4 (1.22 >> 1.65)  Plan:  Continue Zosyn 3.375 g IV q8h (4-hr infusion)  Discontinue scheduled vancomycin due to rising SCr. Will dose per levels --Daily Scr per protocol --Will consider checking a random level tomorrow AM  Follow-up plan per ID  Height: '5\' 8"'$  (172.7 cm) Weight: 94.8 kg (208 lb 15.9 oz) IBW/kg (Calculated) : 68.4  Temp (24hrs), Avg:98.6 F (37 C), Min:98.3 F (36.8 C), Max:98.8 F (37.1 C)  Recent Labs  Lab 07/30/22 1435 07/31/22 1445 08/01/22 0930 08/02/22 0424 08/03/22 0336 08/04/22 0333 08/04/22 1824 08/05/22 0147 08/05/22 0319 08/05/22 1409 08/06/22 0115 08/06/22 0116  WBC 9.4  --   --   --   --   --   --   --  10.6*  --  9.6  --   CREATININE 1.57* 1.40*   < > 1.51* 1.28* 1.05  --   --  1.22  --   --  1.65*  LATICACIDVEN 2.1* 0.9  --   --   --   --   --   --   --   --   --   --   VANCOTROUGH  --   --   --   --   --   --   --  10*  --  12*  --   --   VANCOPEAK  --   --   --   --   --   --  13*  --   --   --   --  12*   < > = values in this interval not displayed.     Estimated Creatinine Clearance: 52.5 mL/min (A) (by C-G formula based on SCr of 1.65 mg/dL (H)).    Allergies  Allergen Reactions   Lipitor [Atorvastatin] Palpitations    SVT   Sacubitril-Valsartan Other (See Comments)    SVT Other reaction(s): Other (see comments) Other reaction(s): Other (See Comments) SVT SVT   Pregabalin Palpitations    SVT Other reaction(s): Other (see comments) SVT SVT    Antimicrobials this admission: Vancomycin 12/28 >>  Zosyn 12/28 >>   Dose adjustments this admission: N/A  Microbiology results: 12/28 BCx: NG 1/1  MRSA PCR: (-) 1/2 OR cultures: Staphylococcus aureus, pending  Thank you for allowing pharmacy to be a part of this patient's care.  Benita Gutter 08/06/2022 10:55 AM

## 2022-08-07 ENCOUNTER — Inpatient Hospital Stay: Payer: Self-pay

## 2022-08-07 DIAGNOSIS — L089 Local infection of the skin and subcutaneous tissue, unspecified: Secondary | ICD-10-CM | POA: Diagnosis not present

## 2022-08-07 DIAGNOSIS — M8618 Other acute osteomyelitis, other site: Secondary | ICD-10-CM | POA: Diagnosis not present

## 2022-08-07 DIAGNOSIS — E11628 Type 2 diabetes mellitus with other skin complications: Secondary | ICD-10-CM | POA: Diagnosis not present

## 2022-08-07 DIAGNOSIS — M86172 Other acute osteomyelitis, left ankle and foot: Secondary | ICD-10-CM | POA: Diagnosis not present

## 2022-08-07 DIAGNOSIS — E1151 Type 2 diabetes mellitus with diabetic peripheral angiopathy without gangrene: Secondary | ICD-10-CM | POA: Diagnosis not present

## 2022-08-07 DIAGNOSIS — I5042 Chronic combined systolic (congestive) and diastolic (congestive) heart failure: Secondary | ICD-10-CM | POA: Diagnosis not present

## 2022-08-07 DIAGNOSIS — D62 Acute posthemorrhagic anemia: Secondary | ICD-10-CM | POA: Diagnosis not present

## 2022-08-07 DIAGNOSIS — N179 Acute kidney failure, unspecified: Secondary | ICD-10-CM | POA: Diagnosis not present

## 2022-08-07 LAB — BASIC METABOLIC PANEL
Anion gap: 5 (ref 5–15)
BUN: 40 mg/dL — ABNORMAL HIGH (ref 8–23)
CO2: 22 mmol/L (ref 22–32)
Calcium: 8 mg/dL — ABNORMAL LOW (ref 8.9–10.3)
Chloride: 103 mmol/L (ref 98–111)
Creatinine, Ser: 1.76 mg/dL — ABNORMAL HIGH (ref 0.61–1.24)
GFR, Estimated: 43 mL/min — ABNORMAL LOW (ref >60)
Glucose, Bld: 153 mg/dL — ABNORMAL HIGH (ref 70–99)
Potassium: 3.9 mmol/L (ref 3.5–5.1)
Sodium: 130 mmol/L — ABNORMAL LOW (ref 135–145)

## 2022-08-07 LAB — CBC
HCT: 27 % — ABNORMAL LOW (ref 39.0–52.0)
Hemoglobin: 8.6 g/dL — ABNORMAL LOW (ref 13.0–17.0)
MCH: 28.7 pg (ref 26.0–34.0)
MCHC: 31.9 g/dL (ref 30.0–36.0)
MCV: 90 fL (ref 80.0–100.0)
Platelets: 222 10*3/uL (ref 150–400)
RBC: 3 MIL/uL — ABNORMAL LOW (ref 4.22–5.81)
RDW: 14.1 % (ref 11.5–15.5)
WBC: 8.8 10*3/uL (ref 4.0–10.5)
nRBC: 0 % (ref 0.0–0.2)

## 2022-08-07 LAB — GLUCOSE, CAPILLARY
Glucose-Capillary: 164 mg/dL — ABNORMAL HIGH (ref 70–99)
Glucose-Capillary: 199 mg/dL — ABNORMAL HIGH (ref 70–99)
Glucose-Capillary: 177 mg/dL — ABNORMAL HIGH (ref 70–99)
Glucose-Capillary: 209 mg/dL — ABNORMAL HIGH (ref 70–99)

## 2022-08-07 LAB — VANCOMYCIN, RANDOM: Vancomycin Rm: 11 ug/mL

## 2022-08-07 MED ORDER — SENNOSIDES-DOCUSATE SODIUM 8.6-50 MG PO TABS: 1.0000 | ORAL_TABLET | Freq: Every day | ORAL | Status: DC | PRN

## 2022-08-07 MED ORDER — VANCOMYCIN HCL IN DEXTROSE 1-5 GM/200ML-% IV SOLN
1000.0000 mg | Freq: Once | INTRAVENOUS | Status: AC
  Administered 2022-08-07: 1000 mg via INTRAVENOUS
  Filled 2022-08-07: qty 200

## 2022-08-07 MED ORDER — POLYETHYLENE GLYCOL 3350 17 G PO PACK: 17.0000 g | PACK | Freq: Every day | ORAL | Status: DC | PRN

## 2022-08-07 NOTE — Progress Notes (Signed)
[facility]                                                                               Ricardo Brown, is a 62 y.o. male, DOB - Jul 04, 1961, [id number] Admit date - 07/30/2022    Outpatient Primary MD for the patient is [name], MD  LOS - 8  days    Brief summary   62 year old male with past medical history of diabetes mellitus, poorly controlled, CAD status post CABG, PAD, chronic systolic/diastolic heart failure and obesity who was hospitalized 2 months ago for abscess of the first second interspace of foot and osteomyelitis of second MTP joint status post I&D who was sent to the emergency room on 12/28 for concerns of recurrent osteomyelitis.  Admitted to the hospitalist service.  MRI done noted osteomyelitis in the left fourth toe as well as second toe and metatarsal.  Started on IV antibiotics and podiatry took patient to the OR on 1/2 for transmetatarsal amputation.   Assessment & Plan    Assessment and Plan: * Osteomyelitis of left foot (HCC) Recurrent.  Status post transmetatarsal amputation 1/2.  Continue antibiotics until pathology has come back.  Preliminary notes Staph aureus.  Infectious disease consulted.  On vancomycin.  Acute blood loss anemia Secondary to surgery.  Not present on admission.  Will continue to follow.  Hemoglobin unchanged from previous day.   Acute renal failure superimposed on stage 3a chronic kidney disease ([place]) Treated with IV fluids.  Creatinine at 1.05 on 1/2, but has had steady increase, currently at 1.76.  ARB on hold  Chronic combined systolic (congestive) and diastolic (congestive) heart failure ([place]) -2D echo 5/23 had shown EF of 45 to 50% Continue losartan.  BNP mildly evaded at 386.  Will give 1 dose of Lasix.  Type 2 diabetes mellitus with diabetic chronic kidney disease (HCC) CBG (last 3)  Recent Labs    08/06/22 2124 08/07/22 0726 08/07/22 1200  GLUCAP 220* 164* 199*    Resume SSI.    PAD  (peripheral artery disease) ([place]) History of left lower extremity angiogram on 11/6 Plavix last given on Friday, 12/29. Continue rosuvastatin  Hypertension BP stable,well controlled.   continue metoprolol, and losartan  Coronary artery disease No complaints of chest pain, EKG nonacute Continue rosuvastatin, aspirin and metoprolol.   Holding Plavix for procedure  Obesity: Patient meets criteria with BMI greater than 30 Estimated body mass index is 31.78 kg/m as calculated from the following:   Height as of this encounter: '5\' 8"'$  (1.727 m).   Weight as of this encounter: 94.8 kg.  Code Status: full code.  DVT Prophylaxis:  SCDs Start: 07/30/22 1940   Level of Care: Level of care: Med-Surg Family Communication: Will call wife  Disposition Plan:     Remains inpatient appropriate:   -Pathology back and determination of further antibiotics needed -Improvement in renal function  Procedures:  Status post transmetatarsal amputation  Consultants:   PODIATRY.  Infectious disease  Antimicrobials:   Anti-infectives (From admission, onward)    Start     Dose/Rate Route Frequency Ordered Stop   08/07/22 0000  Ampicillin-Sulbactam (UNASYN) 3 g in sodium chloride 0.9 %  100 mL IVPB        3 g 200 mL/hr over 30 Minutes Intravenous Every 6 hours 08/06/22 1523     08/06/22 0745  vancomycin variable dose per unstable renal function (pharmacist dosing)         Does not apply See admin instructions 08/06/22 0745     08/05/22 1500  vancomycin (VANCOREADY) IVPB 1250 mg/250 mL  Status:  Discontinued        1,250 mg 166.7 mL/hr over 90 Minutes Intravenous Every 12 hours 08/05/22 0432 08/05/22 0751   08/05/22 1500  vancomycin (VANCOREADY) IVPB 750 mg/150 mL  Status:  Discontinued        750 mg 150 mL/hr over 60 Minutes Intravenous Every 12 hours 08/05/22 0751 08/06/22 0745   08/04/22 1400  vancomycin (VANCOCIN) IVPB 750 mg/150 ml premix  Status:  Discontinued        750 mg 150 mL/hr over 60  Minutes Intravenous Every 12 hours 08/04/22 1329 08/05/22 0432   08/04/22 1312  piperacillin-tazobactam (ZOSYN) 3.375 GM/50ML IVPB       Note to Pharmacy: Maryagnes Amos B: cabinet override      08/04/22 1312 08/05/22 1810   08/02/22 1400  vancomycin (VANCOREADY) IVPB 750 mg/150 mL  Status:  Discontinued        750 mg 150 mL/hr over 60 Minutes Intravenous Every 12 hours 08/02/22 1234 08/04/22 1329   08/01/22 1300  vancomycin (VANCOREADY) IVPB 1250 mg/250 mL  Status:  Discontinued        1,250 mg 166.7 mL/hr over 90 Minutes Intravenous Every 24 hours 07/31/22 1618 08/02/22 1234   07/31/22 2200  piperacillin-tazobactam (ZOSYN) IVPB 3.375 g  Status:  Discontinued        3.375 g 12.5 mL/hr over 240 Minutes Intravenous Every 8 hours 07/31/22 1445 08/06/22 1523   07/31/22 1100  piperacillin-tazobactam (ZOSYN) IVPB 3.375 g        3.375 g 100 mL/hr over 30 Minutes Intravenous  Once 07/31/22 1046 07/31/22 1641   07/31/22 1100  vancomycin (VANCOREADY) IVPB 1500 mg/300 mL        1,500 mg 150 mL/hr over 120 Minutes Intravenous  Once 07/31/22 1056 07/31/22 1641   07/30/22 1745  vancomycin (VANCOCIN) IVPB 1000 mg/200 mL premix        1,000 mg 200 mL/hr over 60 Minutes Intravenous  Once 07/30/22 1743 07/30/22 2054   07/30/22 1745  piperacillin-tazobactam (ZOSYN) IVPB 3.375 g        3.375 g 100 mL/hr over 30 Minutes Intravenous  Once 07/30/22 1743 07/30/22 2028        Medications  Scheduled Meds:  amiodarone  200 mg Oral Daily   aspirin EC  81 mg Oral Daily   clopidogrel  75 mg Oral Daily   enoxaparin (LOVENOX) injection  0.5 mg/kg Subcutaneous Q24H   gabapentin  100 mg Oral BID   insulin aspart  0-15 Units Subcutaneous TID WC   insulin aspart  0-5 Units Subcutaneous QHS   losartan  25 mg Oral Daily   metoprolol succinate  50 mg Oral Daily   pramipexole  1.5 mg Oral QHS   rosuvastatin  20 mg Oral Daily   vancomycin variable dose per unstable renal function (pharmacist dosing)   Does  not apply See admin instructions   Continuous Infusions:  ampicillin-sulbactam (UNASYN) IV 3 g (08/07/22 1227)   PRN Meds:.acetaminophen **OR** acetaminophen, HYDROcodone-acetaminophen, morphine injection, ondansetron **OR** ondansetron (ZOFRAN) IV, mouth rinse    Subjective:   Minimal  pain.  Objective:   Vitals:   08/06/22 0832 08/06/22 1622 08/07/22 0033 08/07/22 0816  BP: 110/61 123/66 105/61 105/65  Pulse: 71 60 (!) 59 66  Resp: '16 16 18 16  '$ Temp: 98.3 F (36.8 C) 98.4 F (36.9 C) 97.9 F (36.6 C) 98.8 F (37.1 C)  TempSrc:      SpO2: 100% 100% 96% 97%  Weight:      Height:        Intake/Output Summary (Last 24 hours) at 08/07/2022 1437 Last data filed at 08/07/2022 0514 Gross per 24 hour  Intake 100 ml  Output 1050 ml  Net -950 ml    Filed Weights   07/30/22 1427 08/04/22 1140  Weight: 94.8 kg 94.8 kg     Exam General: Alert and oriented x 3, NAD Cardiovascular: Regular rate and rhythm, S1-S2 Lungs: Clear to auscultation bilaterally Status post transmetatarsal amputation, with wound clean dry and intact.   Data Reviewed:  I have personally reviewed following labs and imaging studies Creatinine 1.76.  Hemoglobin remained stable.   Radiology Studies: No results found.    Annita Brod M.D. [facility] 08/07/2022, 2:37 PM  Available via Epic secure chat 7am-7pm After 7 pm, please refer to night coverage provider listed on amion.

## 2022-08-07 NOTE — Progress Notes (Signed)
Date of Admission:  07/30/2022      ID: Ricardo Brown is a 62 y.o. male  Principal Problem:   Osteomyelitis of left foot ([place]) Active Problems:   Type 2 diabetes mellitus with diabetic chronic kidney disease ([place])   Hypertension   Coronary artery disease   Chronic combined systolic (congestive) and diastolic (congestive) heart failure (HCC)   PAD (peripheral artery disease) (HCC)   Acute renal failure superimposed on stage 2 chronic kidney disease ([place]) Ricardo Brown is a 62 y.o. with a History of diabetes mellitus, CAD status post CABG,mitral valve repair, PVD. multiple amputation of toes of both feet because of, neurotrophic ulcers and gangrene presented to the hospital on 07/30/2022 with concerns for recurrent wound  infection and osteomyelitis of the left foot.   Has had extensive surgery to both feet. 06/05/2022 underwent left foot incision and drainage for foot abscess in the first interspace/second metatarsal phalangeal joint area and left second metatarsal phalangeal joint bone biopsy and bone culture.  Culture was negative. Was prescribed doxycycline   12/20/2021 underwent left second toe partial amputation and culture was positive for Staph aureus, group B strep and Acinetobacter   August 29, 2021 underwent amputation of the right second toe metatarsal phalangeal joint for gangrene with osteomyelitis culture was positive for Staph aureus, Enterobacter cloacae and Streptococcus agalactiae, Prevotella and Pasteurella canis.  Treated with oral antibiotics.   This time in the ED his vitals were BP of 128/72, temp 98.2, heart rate 77, respiratory 20.  WBC 9.4, Hb 12, platelet 302, creatinine 1.57. Blood culture was sent.  MRI of the left foot showed significant progression of septic arthritis of the second MTP joint with extensive osteomyelitis involving the second metatarsal head and neck and also the proximal phalanx.  There was surrounding fluid collection  consistent with abscess. There was septic arthritis at the proximal interphalangeal and of the fourth toe with extensive osteomyelitis involving the proximal and middle phalanges.  He was started on Zosyn and vancomycin. On 08/04/2022 underwent transmetatarsal amputation of the left foot.  Pt seen with Dr.Cline  Subjective: Feeling better  Medications:   amiodarone  200 mg Oral Daily   aspirin EC  81 mg Oral Daily   clopidogrel  75 mg Oral Daily   enoxaparin (LOVENOX) injection  0.5 mg/kg Subcutaneous Q24H   gabapentin  100 mg Oral BID   insulin aspart  0-15 Units Subcutaneous TID WC   insulin aspart  0-5 Units Subcutaneous QHS   losartan  25 mg Oral Daily   metoprolol succinate  50 mg Oral Daily   pramipexole  1.5 mg Oral QHS   rosuvastatin  20 mg Oral Daily   vancomycin variable dose per unstable renal function (pharmacist dosing)   Does not apply See admin instructions    Objective: Vital signs in last 24 hours: Temp:  [97.9 F (36.6 C)-98.8 F (37.1 C)] 98.8 F (37.1 C) (01/05 0816) Pulse Rate:  [59-66] 66 (01/05 0816) Resp:  [16-18] 16 (01/05 0816) BP: (105-123)/(61-66) 105/65 (01/05 0816) SpO2:  [96 %-100 %] 97 % (01/05 0816)    PHYSICAL EXAM:  General: Alert, cooperative, no distress, appears stated age.  Lungs: Clear to auscultation bilaterally. No Wheezing or Rhonchi. No rales. Heart: Regular rate and rhythm, no murmur, rub or gallop. Abdomen: Soft, non-tender,not distended. Bowel sounds normal. No masses Extremities: left foot- TMA site well approximated Some swelling     Skin: No rashes or lesions. Or bruising Lymph:  Cervical, supraclavicular normal. Neurologic: Grossly non-focal  Lab Results Recent Labs    08/06/22 0115 08/06/22 0116 08/07/22 0353  WBC 9.6  --  8.8  HGB 8.6*  --  8.6*  HCT 27.6*  --  27.0*  NA  --  134* 130*  K  --  3.9 3.9  CL  --  107 103  CO2  --  21* 22  BUN  --  29* 40*  CREATININE  --  1.65* 1.76*   Microbiology: Wound culture - Staph aureus    Assessment/Plan: ? Diabetic left foot infection with neuropathy and peripheral arterial disease.  Had an infection in November and it was drained and he was on p.o. doxycycline and was followed by podiatrist until it got worse. Underwent left TMA on 08/04/2022 Cultures so far is Staph aureus Pathology is pending- spoke to Dr.Rubinas- there is chronic osteo at the proximal margin Patient is currently on vancomycin and unasyn If no MRSA we can stop vanco Will need IV antibiotics on discharge- await susceptibility to decide on final antibiotic   Diabetes mellitus   History of CAD status post CABG   History of mitral valve repair   PAD with history of balloon angioplasty of the left tibioperoneal trunk, left anterior tibial artery, left posterior tibial artery in NOV 2023   Discussed the management with patient and care team

## 2022-08-07 NOTE — Progress Notes (Signed)
Spoke with Primary Nurse regarding PICC order, it will not be placed today. Patient has 1 working PIV.

## 2022-08-07 NOTE — Progress Notes (Signed)
3 Days Post-Op   Subjective/Chief Complaint: Patient seen.  Pain has improved.   Objective: Vital signs in last 24 hours: Temp:  [97.9 F (36.6 C)-98.8 F (37.1 C)] 98.8 F (37.1 C) (01/05 0816) Pulse Rate:  [59-66] 66 (01/05 0816) Resp:  [16-18] 16 (01/05 0816) BP: (105-123)/(61-66) 105/65 (01/05 0816) SpO2:  [96 %-100 %] 97 % (01/05 0816) Last BM Date : 08/04/22  Intake/Output from previous day: 01/04 0701 - 01/05 0700 In: 580 [P.O.:480; IV Piggyback:100] Out: 1650 [Urine:1650] Intake/Output this shift: No intake/output data recorded.  The bandage is dry and intact.  Upon removal there is only some mild bleeding from the incision.  No purulence.  Incision is well coapted with some increased edema.  Minimal areas of some early necrosis along the incision line but stable.     Lab Results:  Recent Labs    08/06/22 0115 08/07/22 0353  WBC 9.6 8.8  HGB 8.6* 8.6*  HCT 27.6* 27.0*  PLT 236 222   BMET Recent Labs    08/06/22 0116 08/07/22 0353  NA 134* 130*  K 3.9 3.9  CL 107 103  CO2 21* 22  GLUCOSE 178* 153*  BUN 29* 40*  CREATININE 1.65* 1.76*  CALCIUM 7.8* 8.0*   PT/INR No results for input(s): "LABPROT", "INR" in the last 72 hours. ABG No results for input(s): "PHART", "HCO3" in the last 72 hours.  Invalid input(s): "PCO2", "PO2"  Studies/Results: No results found.  Anti-infectives: Anti-infectives (From admission, onward)    Start     Dose/Rate Route Frequency Ordered Stop   08/07/22 0000  Ampicillin-Sulbactam (UNASYN) 3 g in sodium chloride 0.9 % 100 mL IVPB        3 g 200 mL/hr over 30 Minutes Intravenous Every 6 hours 08/06/22 1523     08/06/22 0745  vancomycin variable dose per unstable renal function (pharmacist dosing)         Does not apply See admin instructions 08/06/22 0745     08/05/22 1500  vancomycin (VANCOREADY) IVPB 1250 mg/250 mL  Status:  Discontinued        1,250 mg 166.7 mL/hr over 90 Minutes Intravenous Every 12 hours  08/05/22 0432 08/05/22 0751   08/05/22 1500  vancomycin (VANCOREADY) IVPB 750 mg/150 mL  Status:  Discontinued        750 mg 150 mL/hr over 60 Minutes Intravenous Every 12 hours 08/05/22 0751 08/06/22 0745   08/04/22 1400  vancomycin (VANCOCIN) IVPB 750 mg/150 ml premix  Status:  Discontinued        750 mg 150 mL/hr over 60 Minutes Intravenous Every 12 hours 08/04/22 1329 08/05/22 0432   08/04/22 1312  piperacillin-tazobactam (ZOSYN) 3.375 GM/50ML IVPB       Note to Pharmacy: Maryagnes Amos B: cabinet override      08/04/22 1312 08/05/22 1810   08/02/22 1400  vancomycin (VANCOREADY) IVPB 750 mg/150 mL  Status:  Discontinued        750 mg 150 mL/hr over 60 Minutes Intravenous Every 12 hours 08/02/22 1234 08/04/22 1329   08/01/22 1300  vancomycin (VANCOREADY) IVPB 1250 mg/250 mL  Status:  Discontinued        1,250 mg 166.7 mL/hr over 90 Minutes Intravenous Every 24 hours 07/31/22 1618 08/02/22 1234   07/31/22 2200  piperacillin-tazobactam (ZOSYN) IVPB 3.375 g  Status:  Discontinued        3.375 g 12.5 mL/hr over 240 Minutes Intravenous Every 8 hours 07/31/22 1445 08/06/22 1523   07/31/22 1100  piperacillin-tazobactam (ZOSYN) IVPB 3.375 g        3.375 g 100 mL/hr over 30 Minutes Intravenous  Once 07/31/22 1046 07/31/22 1641   07/31/22 1100  vancomycin (VANCOREADY) IVPB 1500 mg/300 mL        1,500 mg 150 mL/hr over 120 Minutes Intravenous  Once 07/31/22 1056 07/31/22 1641   07/30/22 1745  vancomycin (VANCOCIN) IVPB 1000 mg/200 mL premix        1,000 mg 200 mL/hr over 60 Minutes Intravenous  Once 07/30/22 1743 07/30/22 2054   07/30/22 1745  piperacillin-tazobactam (ZOSYN) IVPB 3.375 g        3.375 g 100 mL/hr over 30 Minutes Intravenous  Once 07/30/22 1743 07/30/22 2028       Assessment/Plan: s/p Procedure(s): TRANSMETATARSAL AMPUTATION (Left) Assessment: Stable status post transmetatarsal amputation left foot.  Plan: Betadine gauze and sterile bandage reapplied to the left  foot.  Patient instructed to keep this clean, dry, and do not remove.  Continue with strict nonweightbearing on the left lower extremity.  Recommended elevation at home.  Patient should be stable for discharge pending pathology and culture results.  Antibiotics per infectious disease.  Plan for follow-up in 1 week outpatient.  LOS: 8 days    [name] 08/07/2022

## 2022-08-07 NOTE — Care Management Important Message (Signed)
Important Message  Patient Details  Name: Ricardo Brown MRN: [id number] Date of Birth: [birth year]   Medicare Important Message Given:  Yes     Juliann Pulse A Veora Fonte 08/07/2022, 11:35 AM

## 2022-08-07 NOTE — Consult Note (Signed)
Pharmacy Antibiotic Note  Ricardo Brown is a 62 y.o. male admitted on 07/30/2022 with  osteomyelitis of left foot . Podiatry is following and patient underwent transmetatarsal amputation of left foot on 08/04/22. Pharmacy has been consulted for vancomycin dosing. Zosyn has been changed to unasyn 1/4  Today, 08/07/2022 Day #8 antibiotics Renal: SCr trending up, SCr today 1.76 WBC WNL Afebrile OR culture with S aureus, susc being repeated  Vancomycin levels 1/5 - vancomycin random 11 mcg/ml at 03:53, previously on vanocmyci '750mg'$  IV q12h with last dose given 1/4 at 04:45  Plan: Vancomycin 1gm IV x 1 today Follow renal function - BMP ordered daily until 1/8 Based on Scr result, continue dosing per random level vs start maintenance dose Awaiting S aureus susceptibility  Follow-up plan per ID  Height: '5\' 8"'$  (172.7 cm) Weight: 94.8 kg (208 lb 15.9 oz) IBW/kg (Calculated) : 68.4  Temp (24hrs), Avg:98.4 F (36.9 C), Min:97.9 F (36.6 C), Max:98.8 F (37.1 C)  Recent Labs  Lab 08/03/22 0336 08/04/22 0333 08/04/22 1824 08/05/22 0147 08/05/22 0319 08/05/22 1409 08/06/22 0115 08/06/22 0116 08/07/22 0353  WBC  --   --   --   --  10.6*  --  9.6  --  8.8  CREATININE 1.28* 1.05  --   --  1.22  --   --  1.65* 1.76*  VANCOTROUGH  --   --   --  10*  --  12*  --   --   --   VANCOPEAK  --   --  13*  --   --   --   --  12*  --   VANCORANDOM  --   --   --   --   --   --   --   --  11     Estimated Creatinine Clearance: 49.3 mL/min (A) (by C-G formula based on SCr of 1.76 mg/dL (H)).    Allergies  Allergen Reactions   Lipitor [Atorvastatin] Palpitations    SVT   Sacubitril-Valsartan Other (See Comments)    SVT Other reaction(s): Other (see comments) Other reaction(s): Other (See Comments) SVT SVT   Pregabalin Palpitations    SVT Other reaction(s): Other (see comments) SVT SVT    Antimicrobials this admission: Vancomycin 12/28 >>  Zosyn 12/28 >> 1/4 Amp/sulb 1/4  >>  Dose adjustments this admission: N/A  Microbiology results: 12/28 BCx: NG 1/1 MRSA PCR: (-) 1/2 OR cultures: Staphylococcus aureus, pending  Thank you for allowing pharmacy to be a part of this patient's care.  Doreene Eland, PharmD, BCPS, BCIDP Work Cell: [phone] 08/07/2022 3:41 PM

## 2022-08-08 ENCOUNTER — Inpatient Hospital Stay: Payer: Medicare Other

## 2022-08-08 DIAGNOSIS — M869 Osteomyelitis, unspecified: Secondary | ICD-10-CM | POA: Diagnosis not present

## 2022-08-08 DIAGNOSIS — I739 Peripheral vascular disease, unspecified: Secondary | ICD-10-CM | POA: Diagnosis not present

## 2022-08-08 DIAGNOSIS — E11628 Type 2 diabetes mellitus with other skin complications: Secondary | ICD-10-CM

## 2022-08-08 DIAGNOSIS — L089 Local infection of the skin and subcutaneous tissue, unspecified: Secondary | ICD-10-CM

## 2022-08-08 DIAGNOSIS — I5042 Chronic combined systolic (congestive) and diastolic (congestive) heart failure: Secondary | ICD-10-CM | POA: Diagnosis not present

## 2022-08-08 LAB — BASIC METABOLIC PANEL
Anion gap: 8 (ref 5–15)
BUN: 48 mg/dL — ABNORMAL HIGH (ref 8–23)
CO2: 23 mmol/L (ref 22–32)
Calcium: 8.6 mg/dL — ABNORMAL LOW (ref 8.9–10.3)
Chloride: 101 mmol/L (ref 98–111)
Creatinine, Ser: 1.75 mg/dL — ABNORMAL HIGH (ref 0.61–1.24)
GFR, Estimated: 44 mL/min — ABNORMAL LOW (ref >60)
Glucose, Bld: 136 mg/dL — ABNORMAL HIGH (ref 70–99)
Potassium: 4.2 mmol/L (ref 3.5–5.1)
Sodium: 132 mmol/L — ABNORMAL LOW (ref 135–145)

## 2022-08-08 LAB — GLUCOSE, CAPILLARY
Glucose-Capillary: 140 mg/dL — ABNORMAL HIGH (ref 70–99)
Glucose-Capillary: 165 mg/dL — ABNORMAL HIGH (ref 70–99)
Glucose-Capillary: 214 mg/dL — ABNORMAL HIGH (ref 70–99)
Glucose-Capillary: 156 mg/dL — ABNORMAL HIGH (ref 70–99)

## 2022-08-08 MED ORDER — BISACODYL 10 MG RE SUPP
10.0000 mg | Freq: Once | RECTAL | Status: AC
  Administered 2022-08-08: 10 mg via RECTAL
  Filled 2022-08-08: qty 1

## 2022-08-08 MED ORDER — CHLORHEXIDINE GLUCONATE CLOTH 2 % EX PADS
6.0000 | MEDICATED_PAD | Freq: Every day | CUTANEOUS | Status: DC
  Administered 2022-08-08 – 2022-08-10 (×3): 6 via TOPICAL

## 2022-08-08 MED ORDER — SODIUM CHLORIDE 0.9% FLUSH: 10.0000 mL | INTRAVENOUS | Status: DC | PRN

## 2022-08-08 MED ORDER — TORSEMIDE 20 MG PO TABS
40.0000 mg | ORAL_TABLET | Freq: Every day | ORAL | Status: DC
  Administered 2022-08-08 – 2022-08-11 (×4): 40 mg via ORAL
  Filled 2022-08-08 (×4): qty 2

## 2022-08-08 MED ORDER — SODIUM CHLORIDE 0.9% FLUSH
10.0000 mL | Freq: Two times a day (BID) | INTRAVENOUS | Status: DC
  Administered 2022-08-09 – 2022-08-10 (×3): 10 mL

## 2022-08-08 NOTE — Plan of Care (Signed)
  Problem: Education: Goal: Knowledge of General Education information will improve Description Including pain rating scale, medication(s)/side effects and non-pharmacologic comfort measures Outcome: Progressing   Problem: Health Behavior/Discharge Planning: Goal: Ability to manage health-related needs will improve Outcome: Progressing   

## 2022-08-08 NOTE — Progress Notes (Signed)
CXR obtained for PICC confirmation. OK to use. Landry Mellow, RN made aware.

## 2022-08-08 NOTE — Progress Notes (Signed)
Peripherally Inserted Central Catheter Placement  The IV Nurse has discussed with the patient and/or persons authorized to consent for the patient, the purpose of this procedure and the potential benefits and risks involved with this procedure.  The benefits include less needle sticks, lab draws from the catheter, and the patient may be discharged home with the catheter. Risks include, but not limited to, infection, bleeding, blood clot (thrombus formation), and puncture of an artery; nerve damage and irregular heartbeat and possibility to perform a PICC exchange if needed/ordered by physician.  Alternatives to this procedure were also discussed.  Bard Power PICC patient education guide, fact sheet on infection prevention and patient information card has been provided to patient /or left at bedside.    PICC Placement Documentation  PICC Single Lumen 92/01/00 Right Basilic 39 cm 0 cm (Active)  Indication for Insertion or Continuance of Line Home intravenous therapies (PICC only) 08/08/22 1617  Exposed Catheter (cm) 0 cm 08/08/22 1617  Site Assessment Clean, Dry, Intact 08/08/22 1617  Line Status Saline locked;Flushed;Blood return noted 08/08/22 1617  Dressing Type Transparent;Securing device 08/08/22 1617  Dressing Status Antimicrobial disc in place;Clean, Dry, Intact 08/08/22 1617  Safety Lock Not Applicable 71/21/97 5883  Line Care Connections checked and tightened 08/08/22 1617  Line Adjustment (NICU/IV Team Only) No 08/08/22 1617  Dressing Intervention New dressing 08/08/22 1617  Dressing Change Due 08/15/22 08/08/22 Bryan 08/08/2022, 4:17 PM

## 2022-08-08 NOTE — Progress Notes (Signed)
[facility]                                                                               Ricardo Brown, is a 62 y.o. male, DOB - [birth year], QMG:[id number] Admit date - 07/30/2022    Outpatient Primary MD for the patient is Ricardo Athens, MD  LOS - 9  days    Brief summary   62 year old male with past medical history of diabetes mellitus, poorly controlled, CAD status post CABG, PAD, chronic systolic/diastolic heart failure and obesity who was hospitalized 2 months ago for abscess of the first second interspace of foot and osteomyelitis of second MTP joint status post I&D who was sent to the emergency room on 12/28 for concerns of recurrent osteomyelitis.  Admitted to the hospitalist service.  MRI done noted osteomyelitis in the left fourth toe as well as second toe and metatarsal.  Started on IV antibiotics and podiatry took patient to the OR on 1/2 for transmetatarsal amputation.  Pathology noted Staph aureus with chronic osteo at proximal margin, so in discussion with infectious disease/podiatry, plan is for patient to go home on IV antibiotics, with antibiotic type to be determined when sensitivities come back if MRSA coverage needed.  Assessment & Plan    Assessment and Plan: * Osteomyelitis of left foot (HCC) Recurrent.  Status post transmetatarsal amputation 1/2.  Continue antibiotics until pathology has come back.  Preliminary notes Staph aureus.  Infectious disease consulted.  On vancomycin currently.  Acute blood loss anemia Secondary to surgery.  Not present on admission.  Will continue to follow.  Hemoglobin unchanged from previous day.   Acute renal failure superimposed on stage 3a chronic kidney disease ([place]) Initially treated with IV fluids.  Creatinine at 1.05 on 1/2, but has had steady increase, currently at 1.76.  Holding ARB as of 1/6.  Watching now that we are restarting Lasix.  Chronic combined systolic (congestive) and diastolic (congestive)  heart failure ([place]) -2D echo 5/23 had shown EF of 45 to 50% Continue losartan.  BNP mildly evaded at 386.  Will resume home Lasix.  Type 2 diabetes mellitus with diabetic chronic kidney disease ([place]) CBG (last 3)  Recent Labs    08/07/22 2140 08/08/22 0811 08/08/22 1203  GLUCAP 209* 140* 165*    Resume SSI.    PAD (peripheral artery disease) ([place]) History of left lower extremity angiogram on 11/6 Plavix last given on Friday, 12/29. Continue rosuvastatin  Hypertension BP stable,well controlled.   continue metoprolol, and losartan  Coronary artery disease No complaints of chest pain, EKG nonacute Continue rosuvastatin, aspirin and metoprolol.   Holding Plavix for procedure  Obesity: Patient meets criteria with BMI greater than 30 Estimated body mass index is 31.78 kg/m as calculated from the following:   Height as of this encounter: '5\' 8"'$  (1.727 m).   Weight as of this encounter: 94.8 kg.  Code Status: full code.  DVT Prophylaxis:  SCDs Start: 07/30/22 1940   Level of Care: Level of care: Med-Surg Family Communication: Will call wife  Disposition Plan:     Remains inpatient appropriate:   -Pathology back and determination of further antibiotics needed -Improvement in renal function  Procedures:  Status post transmetatarsal amputation Placement of PICC line pending  Consultants:   PODIATRY.  Infectious disease  Antimicrobials:   Anti-infectives (From admission, onward)    Start     Dose/Rate Route Frequency Ordered Stop   08/07/22 1630  vancomycin (VANCOCIN) IVPB 1000 mg/200 mL premix        1,000 mg 200 mL/hr over 60 Minutes Intravenous  Once 08/07/22 1539 08/07/22 1809   08/07/22 0000  Ampicillin-Sulbactam (UNASYN) 3 g in sodium chloride 0.9 % 100 mL IVPB        3 g 200 mL/hr over 30 Minutes Intravenous Every 6 hours 08/06/22 1523     08/06/22 0745  vancomycin variable dose per unstable renal function (pharmacist dosing)         Does not apply See  admin instructions 08/06/22 0745     08/05/22 1500  vancomycin (VANCOREADY) IVPB 1250 mg/250 mL  Status:  Discontinued        1,250 mg 166.7 mL/hr over 90 Minutes Intravenous Every 12 hours 08/05/22 0432 08/05/22 0751   08/05/22 1500  vancomycin (VANCOREADY) IVPB 750 mg/150 mL  Status:  Discontinued        750 mg 150 mL/hr over 60 Minutes Intravenous Every 12 hours 08/05/22 0751 08/06/22 0745   08/04/22 1400  vancomycin (VANCOCIN) IVPB 750 mg/150 ml premix  Status:  Discontinued        750 mg 150 mL/hr over 60 Minutes Intravenous Every 12 hours 08/04/22 1329 08/05/22 0432   08/04/22 1312  piperacillin-tazobactam (ZOSYN) 3.375 GM/50ML IVPB       Note to Pharmacy: Maryagnes Amos B: cabinet override      08/04/22 1312 08/05/22 1810   08/02/22 1400  vancomycin (VANCOREADY) IVPB 750 mg/150 mL  Status:  Discontinued        750 mg 150 mL/hr over 60 Minutes Intravenous Every 12 hours 08/02/22 1234 08/04/22 1329   08/01/22 1300  vancomycin (VANCOREADY) IVPB 1250 mg/250 mL  Status:  Discontinued        1,250 mg 166.7 mL/hr over 90 Minutes Intravenous Every 24 hours 07/31/22 1618 08/02/22 1234   07/31/22 2200  piperacillin-tazobactam (ZOSYN) IVPB 3.375 g  Status:  Discontinued        3.375 g 12.5 mL/hr over 240 Minutes Intravenous Every 8 hours 07/31/22 1445 08/06/22 1523   07/31/22 1100  piperacillin-tazobactam (ZOSYN) IVPB 3.375 g        3.375 g 100 mL/hr over 30 Minutes Intravenous  Once 07/31/22 1046 07/31/22 1641   07/31/22 1100  vancomycin (VANCOREADY) IVPB 1500 mg/300 mL        1,500 mg 150 mL/hr over 120 Minutes Intravenous  Once 07/31/22 1056 07/31/22 1641   07/30/22 1745  vancomycin (VANCOCIN) IVPB 1000 mg/200 mL premix        1,000 mg 200 mL/hr over 60 Minutes Intravenous  Once 07/30/22 1743 07/30/22 2054   07/30/22 1745  piperacillin-tazobactam (ZOSYN) IVPB 3.375 g        3.375 g 100 mL/hr over 30 Minutes Intravenous  Once 07/30/22 1743 07/30/22 2028         Medications  Scheduled Meds:  amiodarone  200 mg Oral Daily   aspirin EC  81 mg Oral Daily   clopidogrel  75 mg Oral Daily   enoxaparin (LOVENOX) injection  0.5 mg/kg Subcutaneous Q24H   gabapentin  100 mg Oral BID   insulin aspart  0-15 Units Subcutaneous TID WC   insulin aspart  0-5 Units Subcutaneous QHS  losartan  25 mg Oral Daily   metoprolol succinate  50 mg Oral Daily   pramipexole  1.5 mg Oral QHS   rosuvastatin  20 mg Oral Daily   vancomycin variable dose per unstable renal function (pharmacist dosing)   Does not apply See admin instructions   Continuous Infusions:  ampicillin-sulbactam (UNASYN) IV 3 g (08/08/22 1237)   PRN Meds:.acetaminophen **OR** acetaminophen, HYDROcodone-acetaminophen, morphine injection, ondansetron **OR** ondansetron (ZOFRAN) IV, mouth rinse, polyethylene glycol, senna-docusate    Subjective:   Minimal pain.  Objective:   Vitals:   08/07/22 0816 08/07/22 1543 08/07/22 2331 08/08/22 0813  BP: 105/65 120/64 122/70 125/68  Pulse: 66 65 64 64  Resp: '16 16 18 18  '$ Temp: 98.8 F (37.1 C) 98.1 F (36.7 C) 98.8 F (37.1 C) 98.8 F (37.1 C)  TempSrc:      SpO2: 97% 97% 96% 97%  Weight:      Height:        Intake/Output Summary (Last 24 hours) at 08/08/2022 1417 Last data filed at 08/08/2022 0905 Gross per 24 hour  Intake 400 ml  Output 575 ml  Net -175 ml    Filed Weights   07/30/22 1427 08/04/22 1140  Weight: 94.8 kg 94.8 kg     Exam General: Alert and oriented x 3, NAD Cardiovascular: Regular rate and rhythm, S1-S2 Lungs: Clear to auscultation bilaterally Status post transmetatarsal amputation, with wound clean dry and intact.   Data Reviewed:  I have personally reviewed following labs and imaging studies Creatinine remains at 1.76.     Radiology Studies: [place] EKG SITE RITE  Result Date: 08/07/2022 If Site Rite image not attached, placement could not be confirmed due to current cardiac rhythm.     Annita Brod M.D. [facility] 08/08/2022, 2:17 PM  Available via Epic secure chat 7am-7pm After 7 pm, please refer to night coverage provider listed on amion.

## 2022-08-08 NOTE — Plan of Care (Signed)

## 2022-08-09 DIAGNOSIS — M86172 Other acute osteomyelitis, left ankle and foot: Secondary | ICD-10-CM | POA: Diagnosis not present

## 2022-08-09 LAB — AEROBIC/ANAEROBIC CULTURE W GRAM STAIN (SURGICAL/DEEP WOUND)

## 2022-08-09 LAB — BASIC METABOLIC PANEL
Anion gap: 9 (ref 5–15)
BUN: 49 mg/dL — ABNORMAL HIGH (ref 8–23)
CO2: 23 mmol/L (ref 22–32)
Calcium: 8.7 mg/dL — ABNORMAL LOW (ref 8.9–10.3)
Chloride: 104 mmol/L (ref 98–111)
Creatinine, Ser: 1.52 mg/dL — ABNORMAL HIGH (ref 0.61–1.24)
GFR, Estimated: 52 mL/min — ABNORMAL LOW (ref >60)
Glucose, Bld: 137 mg/dL — ABNORMAL HIGH (ref 70–99)
Potassium: 4 mmol/L (ref 3.5–5.1)
Sodium: 136 mmol/L (ref 135–145)

## 2022-08-09 LAB — GLUCOSE, CAPILLARY
Glucose-Capillary: 134 mg/dL — ABNORMAL HIGH (ref 70–99)
Glucose-Capillary: 180 mg/dL — ABNORMAL HIGH (ref 70–99)
Glucose-Capillary: 110 mg/dL — ABNORMAL HIGH (ref 70–99)
Glucose-Capillary: 123 mg/dL — ABNORMAL HIGH (ref 70–99)

## 2022-08-09 NOTE — Progress Notes (Signed)
PROGRESS NOTE Ricardo Brown  MLY:[id number] DOB: [birth year] DOA: 07/30/2022 PCP: [name], MD   Brief Narrative/Hospital Course: 62 year old male with past medical history of diabetes mellitus, poorly controlled, CAD status post CABG, PAD, chronic systolic/diastolic heart failure and obesity who was hospitalized 2 months ago for abscess of the first second interspace of foot and osteomyelitis of second MTP joint status post I&D who was sent to the ED on 12/28 for concerns of recurrent osteomyelitis and was admitted to the hospitalist service.  MRI done noted osteomyelitis in the left fourth toe as well as second toe and metatarsal.  Started on IV antibiotics and podiatry took patient to the OR on 1/2 for transmetatarsal amputation.  Pathology noted Staph aureus with chronic osteo at proximal margin, so in discussion with infectious disease/podiatry, plan is for patient to go home on IV antibiotics, with antibiotic type to be determined when sensitivities come back if MRSA coverage needed.  Subjective: Seen and examined this morning. Overnight patient was afebrile BP in 10 5-1 09 stable  creatinine 1.5 this morning improved from 1.7  Assessment and Plan: Principal Problem:   Osteomyelitis of left foot (HCC) Active Problems:   Acute renal failure superimposed on stage 2 chronic kidney disease (HCC)   Chronic combined systolic (congestive) and diastolic (congestive) heart failure (HCC)   Type 2 diabetes mellitus with diabetic chronic kidney disease (HCC)   PAD (peripheral artery disease) (HCC)   Hypertension   Coronary artery disease  Osteomyelitis of left foot Diabetic left foot infection with neuropathy and peripheral arterial disease Previous infection in November that was drained and has been on oral antibiotics S/P left TMA 1/2> over culture with MSSA: Podiatry ID following closely.Recurrent staph infection.Infectious disease consulted.On IV Unasyn/vancomycin> 4 culture  came back with MSSA, vancomycin discontinued.Plan is to continue IV antibiotics at home   Acute blood loss anemia: In the setting of surgery.  Hemoglobin overall stable monitor Recent Labs  Lab 08/05/22 0319 08/06/22 0115 08/07/22 0353  HGB 9.1* 8.6* 8.6*  HCT 28.6* 27.6* 27.0*    Aki on CKD3a: Creatinine has been as low as 1.0-1.2, overall improving, ARB on hold patient received IV fluids, encourage oral hydration, currently on torsemide. Recent Labs    07/30/22 1435 07/31/22 1445 08/01/22 0930 08/02/22 0424 08/03/22 0336 08/04/22 0333 08/05/22 0319 08/06/22 0116 08/07/22 0353 08/08/22 0444 08/09/22 0608  BUN 41*  --  22 25* '20 16 16 '$ 29* 40* 48* 49*  CREATININE 1.57* 1.40* 1.29* 1.51* 1.28* 1.05 1.22 1.65* 1.76* 1.75* 1.52*   Chronic combined systolic and diastolic CHF with EF 81-27%.  Continue losartan, torsemide.  Previously BNP 385 on 1/2.  Currently stable volume status.  Monitor net balance as below and daily intake and avoid Net IO Since Admission: -3,436.13 mL [08/09/22 1151]    Type 2 diabetes mellitus with diabetic chronic kidney disease: Blood sugar remains well-controlled, continue SSI, Recent Labs  Lab 08/08/22 1203 08/08/22 1708 08/08/22 2104 08/09/22 0806 08/09/22 1148  GLUCAP 165* 214* 156* 134* 180*   History of A-fib/PSVT: On amiodarone  PAD HLD CAD: History of left lower extremity angiogram on 11/6.  Currently no chest pain.  Continue with aspirin 81, Plavix 75, Crestor 20 mg, metoprolol   Hypertension: BP stable continue with current metoprolol and losartan and diuretics  Class I Obesity:Patient's Body mass index is 31.78 kg/m. : Will benefit with PCP follow-up, weight loss  healthy lifestyle and outpatient sleep evaluation.   DVT prophylaxis: SCDs Start: 07/30/22 1940  Code Status:   Code Status: Full Code Family Communication: plan of care discussed with patient at bedside. Patient status is: Inpatient because of need for ongoing IV  antibiotics Level of care: Med-Surg   Dispo: The patient is from: home            Anticipated disposition: home w/ IV antibiotics on Monday  Objective: Vitals last 24 hrs: Vitals:   08/08/22 0813 08/08/22 1706 08/09/22 0014 08/09/22 0805  BP: 125/68 120/61 (!) 105/53 (!) 109/55  Pulse: 64 65 (!) 55 64  Resp: '18 18 18 15  '$ Temp: 98.8 F (37.1 C) 99 F (37.2 C) 98 F (36.7 C) 97.8 F (36.6 C)  TempSrc:      SpO2: 97% 98% 99% 96%  Weight:      Height:       Weight change:   Physical Examination: General exam: alert awake, older than stated age HEENT:Oral mucosa moist, Ear/Nose WNL grossly Respiratory system: bilaterally clear BS, no use of accessory muscle Cardiovascular system: S1 & S2 +, No JVD. Gastrointestinal system: Abdomen soft,NT,ND, BS+ Nervous System:Alert, awake, moving extremities. Extremities: Right foot surgical site with dressing in place at the amputated site Skin: No rashes,no icterus. MSK: Normal muscle bulk,tone, power  Medications reviewed:  Scheduled Meds:  amiodarone  200 mg Oral Daily   aspirin EC  81 mg Oral Daily   Chlorhexidine Gluconate Cloth  6 each Topical Daily   clopidogrel  75 mg Oral Daily   enoxaparin (LOVENOX) injection  0.5 mg/kg Subcutaneous Q24H   gabapentin  100 mg Oral BID   insulin aspart  0-15 Units Subcutaneous TID WC   insulin aspart  0-5 Units Subcutaneous QHS   metoprolol succinate  50 mg Oral Daily   pramipexole  1.5 mg Oral QHS   rosuvastatin  20 mg Oral Daily   sodium chloride flush  10-40 mL Intracatheter Q12H   torsemide  40 mg Oral Daily  Continuous Infusions:  ampicillin-sulbactam (UNASYN) IV 3 g (08/09/22 0542)    Diet Order             Diet Carb Modified Fluid consistency: Thin; Room service appropriate? Yes  Diet effective now                  Intake/Output Summary (Last 24 hours) at 08/09/2022 1149 Last data filed at 08/09/2022 0616 Gross per 24 hour  Intake 120 ml  Output 2050 ml  Net -1930 ml    Net IO Since Admission: -3,436.13 mL [08/09/22 1149]  Wt Readings from Last 3 Encounters:  08/04/22 94.8 kg  06/18/22 94.4 kg  06/02/22 95.5 kg     Unresulted Labs (From admission, onward)     Start     Ordered   08/08/22 3016  Basic metabolic panel  Daily,   R      08/07/22 1440          Data Reviewed: I have personally reviewed following labs and imaging studies CBC: Recent Labs  Lab 08/05/22 0319 08/06/22 0115 08/07/22 0353  WBC 10.6* 9.6 8.8  HGB 9.1* 8.6* 8.6*  HCT 28.6* 27.6* 27.0*  MCV 89.4 91.7 90.0  PLT 247 236 010   Basic Metabolic Panel: Recent Labs  Lab 08/05/22 0319 08/06/22 0116 08/07/22 0353 08/08/22 0444 08/09/22 0608  NA 133* 134* 130* 132* 136  K 4.1 3.9 3.9 4.2 4.0  CL 106 107 103 101 104  CO2 20* 21* '22 23 23  '$ GLUCOSE 154* 178* 153* 136*  137*  BUN 16 29* 40* 48* 49*  CREATININE 1.22 1.65* 1.76* 1.75* 1.52*  CALCIUM 7.8* 7.8* 8.0* 8.6* 8.7*  CBG: Recent Labs  Lab 08/08/22 0811 08/08/22 1203 08/08/22 1708 08/08/22 2104 08/09/22 0806  GLUCAP 140* 165* 214* 156* 134*   Recent Results (from the past 240 hour(s))  Culture, blood (routine x 2)     Status: None   Collection Time: 07/30/22  6:48 PM   Specimen: BLOOD  Result Value Ref Range Status   Specimen Description BLOOD LEFT ANTECUBITAL  Final   Special Requests   Final    BOTTLES DRAWN AEROBIC AND ANAEROBIC Blood Culture adequate volume   Culture   Final    NO GROWTH 5 DAYS Performed at [facility], [address] [place], [place] [address]    Report Status 08/04/2022 FINAL  Final  Culture, blood (routine x 2)     Status: None   Collection Time: 07/30/22  7:00 PM   Specimen: BLOOD  Result Value Ref Range Status   Specimen Description BLOOD RIGHT ANTECUBITAL  Final   Special Requests   Final    BOTTLES DRAWN AEROBIC AND ANAEROBIC Blood Culture adequate volume   Culture   Final    NO GROWTH 5 DAYS Performed at [facility], [address]  [place], [place] [address]    Report Status 08/04/2022 FINAL  Final  Surgical pcr screen     Status: None   Collection Time: 08/03/22 12:47 PM   Specimen: Nasal Mucosa; Nasal Swab  Result Value Ref Range Status   MRSA, PCR NEGATIVE NEGATIVE Final   Staphylococcus aureus NEGATIVE NEGATIVE Final    Comment: (NOTE) The Xpert SA Assay (FDA approved for NASAL specimens in patients 76 years of age and older), is one component of a comprehensive surveillance program. It is not intended to diagnose infection nor to guide or monitor treatment. Performed at [facility], [address] [place], [place] [address]   Aerobic/Anaerobic Culture w Gram Stain (surgical/deep wound)     Status: None (Preliminary result)   Collection Time: 08/04/22  1:24 PM   Specimen: PATH Amputaion Arm/Leg; Tissue  Result Value Ref Range Status   Specimen Description   Final    WOUND Performed at [facility], [address] [place], [place] [address]    Special Requests   Final    AMP ARM LEG Performed at [facility], [address] [place], [place] [address]    Gram Stain   Final    RARE WBC PRESENT, PREDOMINANTLY PMN NO ORGANISMS SEEN Performed at [facility], [place] [address] [place], [place] [address]    Culture   Final    RARE STAPHYLOCOCCUS AUREUS NO ANAEROBES ISOLATED; CULTURE IN PROGRESS FOR 5 DAYS    Report Status PENDING  Incomplete   Organism ID, Bacteria STAPHYLOCOCCUS AUREUS  Final      Susceptibility   Staphylococcus aureus - MIC*    CIPROFLOXACIN <=0.5 SENSITIVE Sensitive     ERYTHROMYCIN RESISTANT Resistant     GENTAMICIN <=0.5 SENSITIVE Sensitive     OXACILLIN 0.5 SENSITIVE Sensitive     TETRACYCLINE <=1 SENSITIVE Sensitive     VANCOMYCIN 1 SENSITIVE Sensitive     TRIMETH/SULFA <=10 SENSITIVE Sensitive     CLINDAMYCIN RESISTANT Resistant     RIFAMPIN <=0.5 SENSITIVE Sensitive     Inducible Clindamycin POSITIVE Resistant     * RARE STAPHYLOCOCCUS  AUREUS    Antimicrobials: Anti-infectives (From  admission, onward)    Start     Dose/Rate Route Frequency Ordered Stop   08/07/22 1630  vancomycin (VANCOCIN) IVPB 1000 mg/200 mL premix        1,000 mg 200 mL/hr over 60 Minutes Intravenous  Once 08/07/22 1539 08/07/22 1809   08/07/22 0000  Ampicillin-Sulbactam (UNASYN) 3 g in sodium chloride 0.9 % 100 mL IVPB        3 g 200 mL/hr over 30 Minutes Intravenous Every 6 hours 08/06/22 1523     08/06/22 0745  vancomycin variable dose per unstable renal function (pharmacist dosing)  Status:  Discontinued         Does not apply See admin instructions 08/06/22 0745 08/08/22 1441   08/05/22 1500  vancomycin (VANCOREADY) IVPB 1250 mg/250 mL  Status:  Discontinued        1,250 mg 166.7 mL/hr over 90 Minutes Intravenous Every 12 hours 08/05/22 0432 08/05/22 0751   08/05/22 1500  vancomycin (VANCOREADY) IVPB 750 mg/150 mL  Status:  Discontinued        750 mg 150 mL/hr over 60 Minutes Intravenous Every 12 hours 08/05/22 0751 08/06/22 0745   08/04/22 1400  vancomycin (VANCOCIN) IVPB 750 mg/150 ml premix  Status:  Discontinued        750 mg 150 mL/hr over 60 Minutes Intravenous Every 12 hours 08/04/22 1329 08/05/22 0432   08/04/22 1312  piperacillin-tazobactam (ZOSYN) 3.375 GM/50ML IVPB       Note to Pharmacy: Maryagnes Amos B: cabinet override      08/04/22 1312 08/05/22 1810   08/02/22 1400  vancomycin (VANCOREADY) IVPB 750 mg/150 mL  Status:  Discontinued        750 mg 150 mL/hr over 60 Minutes Intravenous Every 12 hours 08/02/22 1234 08/04/22 1329   08/01/22 1300  vancomycin (VANCOREADY) IVPB 1250 mg/250 mL  Status:  Discontinued        1,250 mg 166.7 mL/hr over 90 Minutes Intravenous Every 24 hours 07/31/22 1618 08/02/22 1234   07/31/22 2200  piperacillin-tazobactam (ZOSYN) IVPB 3.375 g  Status:  Discontinued        3.375 g 12.5 mL/hr over 240 Minutes Intravenous Every 8 hours 07/31/22 1445 08/06/22 1523   07/31/22 1100   piperacillin-tazobactam (ZOSYN) IVPB 3.375 g        3.375 g 100 mL/hr over 30 Minutes Intravenous  Once 07/31/22 1046 07/31/22 1641   07/31/22 1100  vancomycin (VANCOREADY) IVPB 1500 mg/300 mL        1,500 mg 150 mL/hr over 120 Minutes Intravenous  Once 07/31/22 1056 07/31/22 1641   07/30/22 1745  vancomycin (VANCOCIN) IVPB 1000 mg/200 mL premix        1,000 mg 200 mL/hr over 60 Minutes Intravenous  Once 07/30/22 1743 07/30/22 2054   07/30/22 1745  piperacillin-tazobactam (ZOSYN) IVPB 3.375 g        3.375 g 100 mL/hr over 30 Minutes Intravenous  Once 07/30/22 1743 07/30/22 2028      Culture/Microbiology    Component Value Date/Time   SDES  08/04/2022 1324    WOUND Performed at [facility], [address] [place], [place] [address]    [facility]  08/04/2022 1324    AMP ARM LEG Performed at [facility], [address] [place], [place] [address]    CULT  08/04/2022 1324    RARE STAPHYLOCOCCUS AUREUS NO ANAEROBES ISOLATED; CULTURE IN PROGRESS FOR 5 DAYS    REPTSTATUS PENDING 08/04/2022 1324    Other culture-see note  Radiology Studies: DG Chest Port 1 View  Result Date: 08/08/2022 CLINICAL DATA:  PICC placement EXAM: PORTABLE CHEST 1 VIEW COMPARISON:  11/11/2018 FINDINGS: Cardiomegaly status post median sternotomy and CABG. Mild diffuse bilateral interstitial pulmonary opacity. Right upper extremity PICC, tip positioned near the superior cavoatrial junction. Osseous structures unremarkable. IMPRESSION: 1. Cardiomegaly with mild diffuse bilateral interstitial pulmonary opacity, likely edema. 2. Right upper extremity PICC, tip positioned near the superior cavoatrial junction. Electronically Signed   By: Delanna Ahmadi M.D.   On: 08/08/2022 16:36   [place] EKG SITE RITE  Result Date: 08/07/2022 If Site Rite image not attached, placement could not be confirmed due to current cardiac rhythm.    LOS: 10 days   Antonieta Pert, MD [facility]  08/09/2022, 11:49 AM

## 2022-08-09 NOTE — Hospital Course (Addendum)
62 year old male with past medical history of diabetes mellitus, poorly controlled, CAD status post CABG, PAD, chronic systolic/diastolic heart failure and obesity who was hospitalized 2 months ago for abscess of the first second interspace of foot and osteomyelitis of second MTP joint status post I&D who was sent to the ED on 12/28 for concerns of recurrent osteomyelitis and was admitted to the hospitalist service.  MRI done noted osteomyelitis in the left fourth toe as well as second toe and metatarsal.  Started on IV antibiotics and podiatry took patient to the OR on 1/2 for transmetatarsal amputation.  Pathology noted Staph aureus with chronic osteo at proximal margin, so in discussion with infectious disease/podiatry, plan is for patient to go home on IV antibiotics, with antibiotic type to be determined when sensitivities come back if MRSA coverage needed.

## 2022-08-09 NOTE — Consult Note (Signed)
Pharmacy Antibiotic Note  Ricardo Brown is a 62 y.o. male admitted on 07/30/2022 with  osteomyelitis of left foot . Podiatry is following and patient underwent transmetatarsal amputation of left foot on 08/04/22. Pharmacy has been consulted for vancomycin dosing. Zosyn has been changed to unasyn 1/4   Today, 08/09/2022 Day #10 antibiotics Renal: SCr today 1.75 >> 1.52 WBC WNL Afebrile OR culture with MSSA  Plan: D/c vancomycin and continue Unasyn for now Follow renal function - BMP ordered daily until 1/8  Follow-up plan per ID  Height: '5\' 8"'$  (172.7 cm) Weight: 94.8 kg (208 lb 15.9 oz) IBW/kg (Calculated) : 68.4  Temp (24hrs), Avg:98.3 F (36.8 C), Min:97.8 F (36.6 C), Max:99 F (37.2 C)  Recent Labs  Lab 08/04/22 1824 08/05/22 0147 08/05/22 0319 08/05/22 1409 08/06/22 0115 08/06/22 0116 08/07/22 0353 08/08/22 0444 08/09/22 0608  WBC  --   --  10.6*  --  9.6  --  8.8  --   --   CREATININE  --   --  1.22  --   --  1.65* 1.76* 1.75* 1.52*  VANCOTROUGH  --  10*  --  12*  --   --   --   --   --   VANCOPEAK 13*  --   --   --   --  12*  --   --   --   VANCORANDOM  --   --   --   --   --   --  11  --   --      Estimated Creatinine Clearance: 57 mL/min (A) (by C-G formula based on SCr of 1.52 mg/dL (H)).    Allergies  Allergen Reactions   Lipitor [Atorvastatin] Palpitations    SVT   Sacubitril-Valsartan Other (See Comments)    SVT Other reaction(s): Other (see comments) Other reaction(s): Other (See Comments) SVT SVT   Pregabalin Palpitations    SVT Other reaction(s): Other (see comments) SVT SVT    Antimicrobials this admission: Vancomycin 12/28 >> 1/6 Zosyn 12/28 >> 1/4 Amp/sulb 1/4 >>  Dose adjustments this admission: N/A  Microbiology results: 12/28 BCx: NG 1/1 MRSA PCR: (-) 1/2 OR cultures: MSSA  Thank you for allowing pharmacy to be a part of this patient's care.  Chinita Greenland PharmD Clinical Pharmacist 08/09/2022

## 2022-08-10 DIAGNOSIS — M86172 Other acute osteomyelitis, left ankle and foot: Secondary | ICD-10-CM | POA: Diagnosis not present

## 2022-08-10 DIAGNOSIS — L089 Local infection of the skin and subcutaneous tissue, unspecified: Secondary | ICD-10-CM | POA: Diagnosis not present

## 2022-08-10 DIAGNOSIS — M8618 Other acute osteomyelitis, other site: Secondary | ICD-10-CM | POA: Diagnosis not present

## 2022-08-10 DIAGNOSIS — E1151 Type 2 diabetes mellitus with diabetic peripheral angiopathy without gangrene: Secondary | ICD-10-CM | POA: Diagnosis not present

## 2022-08-10 DIAGNOSIS — E11628 Type 2 diabetes mellitus with other skin complications: Secondary | ICD-10-CM | POA: Diagnosis not present

## 2022-08-10 LAB — CBC
HCT: 27 % — ABNORMAL LOW (ref 39.0–52.0)
Hemoglobin: 8.5 g/dL — ABNORMAL LOW (ref 13.0–17.0)
MCH: 27.9 pg (ref 26.0–34.0)
MCHC: 31.5 g/dL (ref 30.0–36.0)
MCV: 88.5 fL (ref 80.0–100.0)
Platelets: 253 10*3/uL (ref 150–400)
RBC: 3.05 MIL/uL — ABNORMAL LOW (ref 4.22–5.81)
RDW: 14.1 % (ref 11.5–15.5)
WBC: 6.5 10*3/uL (ref 4.0–10.5)
nRBC: 0 % (ref 0.0–0.2)

## 2022-08-10 LAB — GLUCOSE, CAPILLARY
Glucose-Capillary: 141 mg/dL — ABNORMAL HIGH (ref 70–99)
Glucose-Capillary: 153 mg/dL — ABNORMAL HIGH (ref 70–99)
Glucose-Capillary: 194 mg/dL — ABNORMAL HIGH (ref 70–99)
Glucose-Capillary: 189 mg/dL — ABNORMAL HIGH (ref 70–99)

## 2022-08-10 LAB — SURGICAL PATHOLOGY

## 2022-08-10 LAB — BASIC METABOLIC PANEL
Anion gap: 8 (ref 5–15)
BUN: 37 mg/dL — ABNORMAL HIGH (ref 8–23)
CO2: 25 mmol/L (ref 22–32)
Calcium: 8.2 mg/dL — ABNORMAL LOW (ref 8.9–10.3)
Chloride: 103 mmol/L (ref 98–111)
Creatinine, Ser: 1.26 mg/dL — ABNORMAL HIGH (ref 0.61–1.24)
GFR, Estimated: >60 mL/min (ref >60)
Glucose, Bld: 219 mg/dL — ABNORMAL HIGH (ref 70–99)
Potassium: 3.5 mmol/L (ref 3.5–5.1)
Sodium: 136 mmol/L (ref 135–145)

## 2022-08-10 MED ORDER — METRONIDAZOLE 500 MG PO TABS
500.0000 mg | ORAL_TABLET | Freq: Two times a day (BID) | ORAL | Status: DC
  Administered 2022-08-10 – 2022-08-11 (×3): 500 mg via ORAL
  Filled 2022-08-10 (×3): qty 1

## 2022-08-10 MED ORDER — VITAMIN C 500 MG PO TABS
500.0000 mg | ORAL_TABLET | Freq: Two times a day (BID) | ORAL | Status: DC
  Administered 2022-08-10 – 2022-08-11 (×2): 500 mg via ORAL
  Filled 2022-08-10 (×2): qty 1

## 2022-08-10 MED ORDER — LOSARTAN POTASSIUM 25 MG PO TABS
25.0000 mg | ORAL_TABLET | Freq: Every day | ORAL | Status: DC
  Administered 2022-08-10 – 2022-08-11 (×2): 25 mg via ORAL
  Filled 2022-08-10 (×2): qty 1

## 2022-08-10 MED ORDER — CEFAZOLIN SODIUM-DEXTROSE 2-4 GM/100ML-% IV SOLN
2.0000 g | Freq: Three times a day (TID) | INTRAVENOUS | Status: DC
  Administered 2022-08-10 – 2022-08-11 (×3): 2 g via INTRAVENOUS
  Filled 2022-08-10 (×3): qty 100

## 2022-08-10 MED ORDER — FE FUM-VIT C-VIT B12-FA 460-60-0.01-1 MG PO CAPS
1.0000 | ORAL_CAPSULE | Freq: Every day | ORAL | Status: DC
  Administered 2022-08-11: 1 via ORAL
  Filled 2022-08-10: qty 1

## 2022-08-10 NOTE — Evaluation (Signed)
Occupational Therapy Evaluation Patient Details Name: Ricardo Brown MRN: [id number] DOB: 1960/11/06 Today's Date: 08/10/2022   History of Present Illness 62 year old male with past medical history of diabetes mellitus, poorly controlled, CAD status post CABG, PAD, chronic systolic/diastolic heart failure and obesity who was hospitalized 2 months ago for abscess of the first second interspace of foot and osteomyelitis of second MTP joint status post I&D who was sent to the ED on 12/28 for concerns of recurrent osteomyelitis and was admitted to the hospitalist service. Started on IV antibiotics and podiatry took patient to the OR on 1/2 for transmetatarsal amputation.   Clinical Impression   Mr Bontrager was seen for OT evaluation this date. Prior to hospital admission, pt was IND for mobility and ADLs. Pt lives with spouse; daughter lives next door. Pt presents to acute OT demonstrating impaired ADL performance and functional mobility 2/2 decreased activity tolerance and functional balance deficits. Pt received on commode - MOD I pericare seated on commode. CGA + RW hand washing standing sink side, poor tolerance, no LOBs noted. CGA + RW toilet t/f, good adherence to [place] pcns. Educated on all DME recs and falls prevention strategies, pt/family agree no acute OT needs, will sign off. Upon hospital discharge, recommend no OT follow up.   Recommendations for follow up therapy are one component of a multi-disciplinary discharge planning process, led by the attending physician.  Recommendations may be updated based on patient status, additional functional criteria and insurance authorization.   Follow Up Recommendations  No OT follow up     Assistance Recommended at Discharge Intermittent Supervision/Assistance  Patient can return home with the following A little help with walking and/or transfers;A little help with bathing/dressing/bathroom;Help with stairs or ramp for entrance     Functional Status Assessment  Patient has had a recent decline in their functional status and demonstrates the ability to make significant improvements in function in a reasonable and predictable amount of time.  Equipment Recommendations  BSC/3in1    Recommendations for Other Services       Precautions / Restrictions Precautions Precautions: Fall Restrictions Weight Bearing Restrictions: Yes LLE Weight Bearing: Non weight bearing      Mobility Bed Mobility               General bed mobility comments: NT    Transfers Overall transfer level: Needs assistance Equipment used: Rolling walker (2 wheels) Transfers: Sit to/from Stand Sit to Stand: Min guard           General transfer comment: cues for safety      Balance Overall balance assessment: Needs assistance Sitting-balance support: No upper extremity supported, Feet supported Sitting balance-Leahy Scale: Normal     Standing balance support: Bilateral upper extremity supported, During functional activity Standing balance-Leahy Scale: Fair                             ADL either performed or assessed with clinical judgement   ADL Overall ADL's : Needs assistance/impaired                                       General ADL Comments: MOD I pericare seated on commode. CGA + RW hand washin standing sink side, poor tolerance, no LOBs noted. CGA + RW toilet t/f, good adherence to [place] pcns      Pertinent Vitals/Pain  Pain Assessment Pain Assessment: No/denies pain     Hand Dominance     Extremity/Trunk Assessment Upper Extremity Assessment Upper Extremity Assessment: Overall WFL for tasks assessed   Lower Extremity Assessment Lower Extremity Assessment: Generalized weakness       Communication Communication Communication: No difficulties   Cognition Arousal/Alertness: Awake/alert Behavior During Therapy: WFL for tasks assessed/performed Overall Cognitive Status:  Within Functional Limits for tasks assessed                                 General Comments: cues for safety      Home Living Family/patient expects to be discharged to:: Private residence Living Arrangements: Spouse/significant other;Children Available Help at Discharge: Family;Available 24 hours/day Type of Home: House Home Access: Stairs to enter [employer] of Steps: 1 step to enter without rails, 1 step down to den (if entering through front door) Entrance Stairs-Rails: None Home Layout: One level     Bathroom Shower/Tub: Teacher, early years/pre: Standard     Home Equipment: None          Prior Functioning/Environment Prior Level of Function : Independent/Modified Independent;Driving                        OT Problem List: Decreased strength;Decreased activity tolerance;Impaired balance (sitting and/or standing)      OT Treatment/Interventions:      OT Goals(Current goals can be found in the care plan section) Acute Rehab OT Goals Patient Stated Goal: to go home OT Goal Formulation: With patient/family Time For Goal Achievement: 08/24/22 Potential to Achieve Goals: Good  OT Frequency:      Co-evaluation              AM-PAC OT "6 Clicks" Daily Activity     Outcome Measure Help from another person eating meals?: None Help from another person taking care of personal grooming?: A Little Help from another person toileting, which includes using toliet, bedpan, or urinal?: A Little Help from another person bathing (including washing, rinsing, drying)?: A Little Help from another person to put on and taking off regular upper body clothing?: None Help from another person to put on and taking off regular lower body clothing?: A Little 6 Click Score: 20   End of Session Nurse Communication: Mobility status  Activity Tolerance: Patient tolerated treatment well Patient left: in chair;with call bell/phone within  reach;with family/visitor present  OT Visit Diagnosis: Muscle weakness (generalized) (M62.81)                Time: 0174-9449 OT Time Calculation (min): 15 min Charges:  OT General Charges $OT Visit: 1 Visit OT Evaluation $OT Eval Low Complexity: 1 Low  Dessie Coma, M.S. OTR/L  08/10/22, 3:46 PM  ascom [phone]

## 2022-08-10 NOTE — Progress Notes (Signed)
PROGRESS NOTE Ricardo Brown  KDT:[id number] DOB: May 01, 1961 DOA: 07/30/2022 PCP: [name], MD   Brief Narrative/Hospital Course: 62 year old male with past medical history of diabetes mellitus, poorly controlled, CAD status post CABG, PAD, chronic systolic/diastolic heart failure and obesity who was hospitalized 2 months ago for abscess of the first second interspace of foot and osteomyelitis of second MTP joint status post I&D who was sent to the ED on 12/28 for concerns of recurrent osteomyelitis and was admitted to the hospitalist service.  MRI done noted osteomyelitis in the left fourth toe as well as second toe and metatarsal.  Started on IV antibiotics and podiatry took patient to the OR on 1/2 for transmetatarsal amputation.  Pathology noted MSSA with chronic osteo at proximal margin, so in discussion with infectious disease/podiatry, plan is for patient to go home on IV antibiotics, ID is recommending continuation of cefazolin until 08/31/2022 and continuation of metronidazole until 08/20/2022.  PICC line was placed as well as home antibiotics. PT/OT was obtained on 08/10/2022 and they are recommending home health services which were ordered.  PT would like to work with him for another day, patient would like to get a knee scooter and need little training.  Patient will be nonweightbearing on left lower extremity.  He will be discharged tomorrow morning.  Subjective: Patient was seen and examined today.  No new complaint.  Sitting in chair comfortably.  He was asking if we can get him a knee scooter as he cannot use crutches and will be nonweightbearing on left lower extremity.  Assessment and Plan: Principal Problem:   Osteomyelitis of left foot (HCC) Active Problems:   Acute renal failure superimposed on stage 2 chronic kidney disease (HCC)   Chronic combined systolic (congestive) and diastolic (congestive) heart failure (HCC)   Type 2 diabetes mellitus with diabetic  chronic kidney disease (HCC)   PAD (peripheral artery disease) (HCC)   Hypertension   Coronary artery disease  Osteomyelitis of left foot Diabetic left foot infection with neuropathy and peripheral arterial disease Previous infection in November that was drained and has been on oral antibiotics S/P left TMA 1/2> over culture with MSSA: Podiatry ID following closely.Recurrent staph infection.Infectious disease consulted.On IV Unasyn/vancomycin> 4 culture came back with MSSA, vancomycin discontinued.Plan is to continue IV antibiotics at home with cefazolin until 08/31/2022 and Flagyl until 08/20/2022. PICC line was placed.   Acute blood loss anemia: In the setting of surgery.  Hemoglobin overall stable monitor   Aki on CKD3a: Creatinine has been as low as 1.0-1.2, overall improving, ARB on hold patient received IV fluids, encourage oral hydration, currently on torsemide. -Restart home losartan  Chronic combined systolic and diastolic CHF with EF 99-83%.  Continue losartan, torsemide.  Previously BNP 385 on 1/2.  Currently stable volume status.  Monitor net balance as below and daily intake and avoid Net IO Since Admission: -5,726.13 mL [08/10/22 1626]    Type 2 diabetes mellitus with diabetic chronic kidney disease: Blood sugar remains well-controlled, continue SSI,  History of A-fib/PSVT: On amiodarone  PAD HLD CAD: History of left lower extremity angiogram on 11/6.  Currently no chest pain.  Continue with aspirin 81, Plavix 75, Crestor 20 mg, metoprolol   Hypertension: Blood pressure mildly elevated. -Restart home losartan -Continue with metoprolol and torsemide  Class I Obesity:Patient's Body mass index is 31.78 kg/m. : Will benefit with PCP follow-up, weight loss  healthy lifestyle and outpatient sleep evaluation.   DVT prophylaxis: SCDs Start: 07/30/22 1940 Code Status:  Code Status: Full Code Family Communication: plan of care discussed with patient at bedside. Patient  status is: Inpatient because of need for ongoing IV antibiotics Level of care: Med-Surg   Dispo: The patient is from: home            Anticipated disposition: home w/ IV antibiotics tomorrow  Objective: Vitals last 24 hrs: Vitals:   08/09/22 0805 08/09/22 1610 08/09/22 2312 08/10/22 0811  BP: (!) 109/55 (!) 123/47 (!) 140/71 (!) 153/74  Pulse: 64 63 67 67  Resp: '15 16 18 16  '$ Temp: 97.8 F (36.6 C) 98.2 F (36.8 C) 98 F (36.7 C) 98 F (36.7 C)  TempSrc:      SpO2: 96% 97% 98% 99%  Weight:      Height:       Weight change:   Physical Examination: General.  Obese gentleman, in no acute distress. Pulmonary.  Lungs clear bilaterally, normal respiratory effort. CV.  Regular rate and rhythm, no JVD, rub or murmur. Abdomen.  Soft, nontender, nondistended, BS positive. CNS.  Alert and oriented .  No focal neurologic deficit. Extremities.  2+ LE edema, left foot with Ace wrap Psychiatry.  Judgment and insight appears normal.   Medications reviewed:  Scheduled Meds:  amiodarone  200 mg Oral Daily   aspirin EC  81 mg Oral Daily   Chlorhexidine Gluconate Cloth  6 each Topical Daily   clopidogrel  75 mg Oral Daily   enoxaparin (LOVENOX) injection  0.5 mg/kg Subcutaneous Q24H   [START ON 08/11/2022] Fe Fum-Vit C-Vit B12-FA  1 capsule Oral QPC breakfast   gabapentin  100 mg Oral BID   insulin aspart  0-15 Units Subcutaneous TID WC   insulin aspart  0-5 Units Subcutaneous QHS   metoprolol succinate  50 mg Oral Daily   metroNIDAZOLE  500 mg Oral Q12H   pramipexole  1.5 mg Oral QHS   rosuvastatin  20 mg Oral Daily   sodium chloride flush  10-40 mL Intracatheter Q12H   torsemide  40 mg Oral Daily  Continuous Infusions:   ceFAZolin (ANCEF) IV 2 g (08/10/22 1319)    Diet Order             Diet Carb Modified Fluid consistency: Thin; Room service appropriate? Yes  Diet effective now                  Intake/Output Summary (Last 24 hours) at 08/10/2022 1626 Last data filed at  08/10/2022 1529 Gross per 24 hour  Intake 460 ml  Output 2750 ml  Net -2290 ml    Net IO Since Admission: -5,726.13 mL [08/10/22 1626]  Wt Readings from Last 3 Encounters:  08/04/22 94.8 kg  06/18/22 94.4 kg  06/02/22 95.5 kg     Unresulted Labs (From admission, onward)    None     Data Reviewed: I have personally reviewed following labs and imaging studies CBC: Recent Labs  Lab 08/05/22 0319 08/06/22 0115 08/07/22 0353 08/10/22 0252  WBC 10.6* 9.6 8.8 6.5  HGB 9.1* 8.6* 8.6* 8.5*  HCT 28.6* 27.6* 27.0* 27.0*  MCV 89.4 91.7 90.0 88.5  PLT 247 236 222 510    Basic Metabolic Panel: Recent Labs  Lab 08/06/22 0116 08/07/22 0353 08/08/22 0444 08/09/22 0608 08/10/22 0252  NA 134* 130* 132* 136 136  K 3.9 3.9 4.2 4.0 3.5  CL 107 103 101 104 103  CO2 21* '22 23 23 25  '$ GLUCOSE 178* 153* 136* 137* 219*  BUN  29* 40* 48* 49* 37*  CREATININE 1.65* 1.76* 1.75* 1.52* 1.26*  CALCIUM 7.8* 8.0* 8.6* 8.7* 8.2*   CBG: Recent Labs  Lab 08/09/22 1148 08/09/22 1612 08/09/22 2154 08/10/22 0813 08/10/22 1230  GLUCAP 180* 110* 123* 141* 153*    Recent Results (from the past 240 hour(s))  Surgical pcr screen     Status: None   Collection Time: 08/03/22 12:47 PM   Specimen: Nasal Mucosa; Nasal Swab  Result Value Ref Range Status   MRSA, PCR NEGATIVE NEGATIVE Final   Staphylococcus aureus NEGATIVE NEGATIVE Final    Comment: (NOTE) The Xpert SA Assay (FDA approved for NASAL specimens in patients 6 years of age and older), is one component of a comprehensive surveillance program. It is not intended to diagnose infection nor to guide or monitor treatment. Performed at [facility], [address], [place], [place] [address]   Aerobic/Anaerobic Culture w Gram Stain (surgical/deep wound)     Status: None   Collection Time: 08/04/22  1:24 PM   Specimen: PATH Amputaion Arm/Leg; Tissue  Result Value Ref Range Status   Specimen Description   Final     WOUND Performed at [facility], [address], [place], [place] [address]    Special Requests   Final    AMP ARM LEG Performed at [facility], [address] [place], [place] [address]    Gram Stain   Final    RARE WBC PRESENT, PREDOMINANTLY PMN NO ORGANISMS SEEN    Culture   Final    RARE STAPHYLOCOCCUS AUREUS NO ANAEROBES ISOLATED Performed at [facility], [place] [address] [place], [place] [address]    Report Status 08/09/2022 FINAL  Final   Organism ID, Bacteria STAPHYLOCOCCUS AUREUS  Final      Susceptibility   Staphylococcus aureus - MIC*    CIPROFLOXACIN <=0.5 SENSITIVE Sensitive     ERYTHROMYCIN RESISTANT Resistant     GENTAMICIN <=0.5 SENSITIVE Sensitive     OXACILLIN 0.5 SENSITIVE Sensitive     TETRACYCLINE <=1 SENSITIVE Sensitive     VANCOMYCIN 1 SENSITIVE Sensitive     TRIMETH/SULFA <=10 SENSITIVE Sensitive     CLINDAMYCIN RESISTANT Resistant     RIFAMPIN <=0.5 SENSITIVE Sensitive     Inducible Clindamycin POSITIVE Resistant     * RARE STAPHYLOCOCCUS AUREUS    Antimicrobials: Anti-infectives (From admission, onward)    Start     Dose/Rate Route Frequency Ordered Stop   08/10/22 1300  ceFAZolin (ANCEF) IVPB 2g/100 mL premix        2 g 200 mL/hr over 30 Minutes Intravenous Every 8 hours 08/10/22 1041     08/10/22 1300  metroNIDAZOLE (FLAGYL) tablet 500 mg        500 mg Oral Every 12 hours 08/10/22 1041 08/20/22 2359   08/07/22 1630  vancomycin (VANCOCIN) IVPB 1000 mg/200 mL premix        1,000 mg 200 mL/hr over 60 Minutes Intravenous  Once 08/07/22 1539 08/07/22 1809   08/07/22 0000  Ampicillin-Sulbactam (UNASYN) 3 g in sodium chloride 0.9 % 100 mL IVPB  Status:  Discontinued        3 g 200 mL/hr over 30 Minutes Intravenous Every 6 hours 08/06/22 1523 08/10/22 1041   08/06/22 0745  vancomycin variable dose per unstable renal function (pharmacist dosing)  Status:  Discontinued         Does not apply See admin instructions 08/06/22  0745 08/08/22 1441   08/05/22 1500  vancomycin (VANCOREADY) IVPB 1250 mg/250 mL  Status:  Discontinued        1,250 mg 166.7 mL/hr over 90 Minutes Intravenous Every 12 hours 08/05/22 0432 08/05/22 0751   08/05/22 1500  vancomycin (VANCOREADY) IVPB 750 mg/150 mL  Status:  Discontinued        750 mg 150 mL/hr over 60 Minutes Intravenous Every 12 hours 08/05/22 0751 08/06/22 0745   08/04/22 1400  vancomycin (VANCOCIN) IVPB 750 mg/150 ml premix  Status:  Discontinued        750 mg 150 mL/hr over 60 Minutes Intravenous Every 12 hours 08/04/22 1329 08/05/22 0432   08/04/22 1312  piperacillin-tazobactam (ZOSYN) 3.375 GM/50ML IVPB       Note to Pharmacy: Maryagnes Amos B: cabinet override      08/04/22 1312 08/05/22 1810   08/02/22 1400  vancomycin (VANCOREADY) IVPB 750 mg/150 mL  Status:  Discontinued        750 mg 150 mL/hr over 60 Minutes Intravenous Every 12 hours 08/02/22 1234 08/04/22 1329   08/01/22 1300  vancomycin (VANCOREADY) IVPB 1250 mg/250 mL  Status:  Discontinued        1,250 mg 166.7 mL/hr over 90 Minutes Intravenous Every 24 hours 07/31/22 1618 08/02/22 1234   07/31/22 2200  piperacillin-tazobactam (ZOSYN) IVPB 3.375 g  Status:  Discontinued        3.375 g 12.5 mL/hr over 240 Minutes Intravenous Every 8 hours 07/31/22 1445 08/06/22 1523   07/31/22 1100  piperacillin-tazobactam (ZOSYN) IVPB 3.375 g        3.375 g 100 mL/hr over 30 Minutes Intravenous  Once 07/31/22 1046 07/31/22 1641   07/31/22 1100  vancomycin (VANCOREADY) IVPB 1500 mg/300 mL        1,500 mg 150 mL/hr over 120 Minutes Intravenous  Once 07/31/22 1056 07/31/22 1641   07/30/22 1745  vancomycin (VANCOCIN) IVPB 1000 mg/200 mL premix        1,000 mg 200 mL/hr over 60 Minutes Intravenous  Once 07/30/22 1743 07/30/22 2054   07/30/22 1745  piperacillin-tazobactam (ZOSYN) IVPB 3.375 g        3.375 g 100 mL/hr over 30 Minutes Intravenous  Once 07/30/22 1743 07/30/22 2028      Culture/Microbiology    Component  Value Date/Time   SDES  08/04/2022 1324    WOUND Performed at [facility], [address], [place], [place] [address]    [facility]  08/04/2022 1324    AMP ARM LEG Performed at [facility], [address] [place], [place] [address]    CULT  08/04/2022 1324    RARE STAPHYLOCOCCUS AUREUS NO ANAEROBES ISOLATED Performed at [facility], [place] [address], [place], [place] [address]    REPTSTATUS 08/09/2022 FINAL 08/04/2022 1324    Other culture-see note  Radiology Studies: DG Chest Port 1 View  Result Date: 08/08/2022 CLINICAL DATA:  PICC placement EXAM: PORTABLE CHEST 1 VIEW COMPARISON:  11/11/2018 FINDINGS: Cardiomegaly status post median sternotomy and CABG. Mild diffuse bilateral interstitial pulmonary opacity. Right upper extremity PICC, tip positioned near the superior cavoatrial junction. Osseous structures unremarkable. IMPRESSION: 1. Cardiomegaly with mild diffuse bilateral interstitial pulmonary opacity, likely edema. 2. Right upper extremity PICC, tip positioned near the superior cavoatrial junction. Electronically Signed   By: Delanna Ahmadi M.D.   On: 08/08/2022 16:36     LOS: 11 days   Lorella Nimrod, MD [facility]  08/10/2022, 4:26 PM

## 2022-08-10 NOTE — Evaluation (Signed)
Physical Therapy Evaluation Patient Details Name: Ricardo Brown MRN: [id number] DOB: 1961/04/23 Today's Date: 08/10/2022  History of Present Illness  Pt is a 62 y.o. male presenting to hospital 07/30/22 with wound infection.  Pt admitted with L foot osteomyelitis.  S/p L foot TMA 08/04/2022.  PMH includes htn, DM, CAD s/p CABG, CHF, CKD, R 2nd toe amputation.  Clinical Impression  Received PT consult to assess for knee scooter use d/t pt's [place] status.  Prior to hospital admission, pt was independent with functional mobility; lives with his wife in 1 level home with steps to enter; pt's daughter lives next door and is a Psychologist, counselling.  No c/o pain during session.  Pt able to maintain Daniel to L foot throughout sessions activities.  Currently pt is CGA to min assist with transfers; CGA ambulating 30 feet with RW (limited d/t pt fatigue); and CGA 200 feet with knee scooter.  Pt requiring initial cueing and demonstration for safe use of RW and knee scooter.  Pt reports fatiguing quickly with RW but felt better with knee scooter use (pt and pt's daughter requesting both for home discharge--TOC notified).  D/t pt fatigue and generalized weakness, discussed modified method for navigating steps into home (pt's family plans to drive vehicle up to front of home where pt can step out of car onto 1st step and then pivot to sit in chair that's on next step up; pt will then transfer from chair to [place] to get to front door and will then turn to sit in chair on 1 step that is in pt's home; pt will then will use knee scooter or RW in home from there).  Pt would benefit from skilled PT to address noted impairments and functional limitations (see below for any additional details).  Upon hospital discharge, pt would benefit from [place] and 24/7 assist.    Recommendations for follow up therapy are one component of a multi-disciplinary discharge planning process, led by the attending physician.  Recommendations may  be updated based on patient status, additional functional criteria and insurance authorization.  Follow Up Recommendations Home health PT      Assistance Recommended at Discharge Frequent or constant Supervision/Assistance  Patient can return home with the following  A little help with walking and/or transfers;A little help with bathing/dressing/bathroom;Assistance with cooking/housework;Assist for transportation;Help with stairs or ramp for entrance    Equipment Recommendations Rolling walker (2 wheels);BSC/3in1;Other (comment) (knee scooter)  Recommendations for Other Services       Functional Status Assessment Patient has had a recent decline in their functional status and demonstrates the ability to make significant improvements in function in a reasonable and predictable amount of time.     Precautions / Restrictions Precautions Precautions: Fall Restrictions Weight Bearing Restrictions: Yes LLE Weight Bearing: Non weight bearing      Mobility  Bed Mobility               General bed mobility comments: Deferred (pt on toilet end of session)    Transfers Overall transfer level: Needs assistance Equipment used: Rolling walker (2 wheels) Transfers: Sit to/from Stand Sit to Stand: Min assist, Min guard           General transfer comment: x2 trials with RW standing from recliner; vc's for UE placement; intermittent assist to steady; stand pivot recliner to/from knee scooter CGA    Ambulation/Gait Ambulation/Gait assistance: Min guard Gait Distance (Feet): 30 Feet Assistive device: Rolling walker (2 wheels)   Gait velocity:  decreased     General Gait Details: vc's for positioning with walker and controlled R LE advancement (L LE NWB'ing)  Stairs            Wheelchair Mobility    Modified Rankin (Stroke Patients Only)       Balance Overall balance assessment: Needs assistance Sitting-balance support: No upper extremity supported, Feet  supported Sitting balance-Leahy Scale: Normal Sitting balance - Comments: steady sitting reaching outside BOS     Standing balance-Leahy Scale: Fair Standing balance comment: steady static standing with B UE support                             Pertinent Vitals/Pain Pain Assessment Pain Assessment: No/denies pain Vitals (HR and O2 on room air) stable and WFL throughout treatment session.    Home Living Family/patient expects to be discharged to:: Private residence Living Arrangements: Spouse/significant other (pt's daughter lives next door (is a Psychologist, counselling)) Available Help at Discharge: Family;Available 24 hours/day Type of Home: House Home Access: Stairs to enter Entrance Stairs-Rails: None Entrance Stairs-Number of Steps: 2 STE plus 1 STE (no railing)   Home Layout: One level Home Equipment: None      Prior Function Prior Level of Function : Independent/Modified Independent;Driving             Mobility Comments: Pt reports he was independent without AD; doesn't work (per pt report d/t being disabled)       Journalist, newspaper        Extremity/Trunk Assessment   Upper Extremity Assessment Upper Extremity Assessment: Overall WFL for tasks assessed    Lower Extremity Assessment Lower Extremity Assessment: Generalized weakness    Cervical / Trunk Assessment Cervical / Trunk Assessment: Normal  Communication   Communication: No difficulties  Cognition Arousal/Alertness: Awake/alert Behavior During Therapy: WFL for tasks assessed/performed Overall Cognitive Status: Within Functional Limits for tasks assessed                                 General Comments: intermittent cues for safety        General Comments General comments (skin integrity, edema, etc.): no drainage noted L foot dressings.  Pt agreeable to PT session.  Pt's daughter present during session.    Exercises  Transfer, gait, and knee scooter training.    Assessment/Plan    PT Assessment Patient needs continued PT services  PT Problem List Decreased strength;Decreased activity tolerance;Decreased balance;Decreased mobility;Decreased knowledge of use of DME;Decreased safety awareness;Decreased knowledge of precautions;Decreased skin integrity       PT Treatment Interventions DME instruction;Gait training;Stair training;Functional mobility training;Therapeutic activities;Therapeutic exercise;Balance training;Patient/family education;Other (comment) (knee scooter training)    PT Goals (Current goals can be found in the Care Plan section)  Acute Rehab PT Goals Patient Stated Goal: to improve mobility PT Goal Formulation: With patient Time For Goal Achievement: 08/24/22 Potential to Achieve Goals: Good    Frequency 7X/week     Co-evaluation               AM-PAC PT "6 Clicks" Mobility  Outcome Measure Help needed turning from your back to your side while in a flat bed without using bedrails?: None Help needed moving from lying on your back to sitting on the side of a flat bed without using bedrails?: None Help needed moving to and from a bed to a chair (including a wheelchair)?:  A Little Help needed standing up from a chair using your arms (e.g., wheelchair or bedside chair)?: A Little Help needed to walk in hospital room?: A Little Help needed climbing 3-5 steps with a railing? : A Lot 6 Click Score: 19    End of Session Equipment Utilized During Treatment: Gait belt Activity Tolerance: Patient tolerated treatment well Patient left:  (sitting on toilet in bathroom (OT present for OT evaluation)) Nurse Communication: Mobility status;Precautions;Weight bearing status PT Visit Diagnosis: Unsteadiness on feet (R26.81);Other abnormalities of gait and mobility (R26.89);Muscle weakness (generalized) (M62.81);Pain Pain - Right/Left: Left Pain - part of body: Ankle and joints of foot    Time: 1359-1451 PT Time Calculation (min)  (ACUTE ONLY): 52 min   Charges:   PT Evaluation $PT Eval Low Complexity: 1 Low PT Treatments $Gait Training: 8-22 mins $Therapeutic Activity: 8-22 mins       Leitha Bleak, PT 08/10/22, 5:19 PM

## 2022-08-10 NOTE — Progress Notes (Signed)
PHARMACY CONSULT NOTE FOR:  OUTPATIENT  PARENTERAL ANTIBIOTIC THERAPY (OPAT)  Indication: Chronic foot osteomyelitis with MSSA Regimen: Cefazolin 2gm IV q8h  End date: 08/31/2022  **Patient also to receive metronidazole '500mg'$  PO BID to 08/20/2022**  Weekly labs: CBC/diff, CMP, CRP, ESR Please pull PIC at completion of IV antibiotics Fax weekly lab results  promptly to [phone]   IV antibiotic discharge orders are pended. To discharging provider:  please sign these orders via discharge navigator,  Select New Orders & click on the button choice - Manage This Unsigned Work.     Thank you for allowing pharmacy to be a part of this patient's care.  Doreene Eland, PharmD, BCPS, BCIDP Work Cell: [phone] 08/10/2022 11:37 AM

## 2022-08-10 NOTE — Plan of Care (Signed)
  Problem: Education: Goal: Knowledge of General Education information will improve Description: Including pain rating scale, medication(s)/side effects and non-pharmacologic comfort measures Outcome: Progressing   Problem: Health Behavior/Discharge Planning: Goal: Ability to manage health-related needs will improve Outcome: Progressing   Problem: Clinical Measurements: Goal: Ability to maintain clinical measurements within normal limits will improve Outcome: Progressing Goal: Diagnostic test results will improve Outcome: Progressing   Problem: Activity: Goal: Risk for activity intolerance will decrease Outcome: Progressing   Problem: Nutrition: Goal: Adequate nutrition will be maintained Outcome: Progressing   Problem: Elimination: Goal: Will not experience complications related to bowel motility Outcome: Progressing Goal: Will not experience complications related to urinary retention Outcome: Progressing   Problem: Pain Managment: Goal: General experience of comfort will improve Outcome: Progressing

## 2022-08-10 NOTE — Progress Notes (Signed)
Patient is not able to walk the distance required to go the bathroom, or he/she is unable to safely negotiate stairs required to access the bathroom.  A 3in1 BSC will alleviate this problem  

## 2022-08-10 NOTE — Treatment Plan (Signed)
Diagnosis: Chronic osteomyelitis of the foot with MSSA Baseline Creatinine 1.26   Allergies  Allergen Reactions   Lipitor [Atorvastatin] Palpitations    SVT   Sacubitril-Valsartan Other (See Comments)    SVT Other reaction(s): Other (see comments) Other reaction(s): Other (See Comments) SVT SVT   Pregabalin Palpitations    SVT Other reaction(s): Other (see comments) SVT SVT    OPAT Orders Discharge antibiotics: Cefazolin 2 grams IV every 8 hours Duration: 4 weeks End Date:08/31/22 Metronidazole '500mg'$  PO BID until 08/20/22   [facility] [address] Care Per Protocol:  Labs weekly while on IV antibiotics: _X_ CBC with differential  _X_ CMP _X_ CRP _x_ ESR   _X_ Please pull PIC at completion of IV antibiotics   Fax weekly lab results  promptly to [phone]  Clinic Follow Up Appt:08/20/22 at 10.15 am   Call [phone] with any questions

## 2022-08-10 NOTE — TOC Transition Note (Signed)
Transition of Care [facility]) - CM/SW Discharge Note   Patient Details  Name: Ricardo Brown MRN: [id number] Date of Birth: 1961-01-04  Transition of Care [facility]) CM/SW Contact:  Quin Hoop, LCSW Phone Number: 08/10/2022, 4:20 PM   Clinical Narrative:     Patient discharging home with [facility] services provided by [facility].  DME equipment ordered through [facility].  Final next level of care: Home w Home Health Services Barriers to Discharge: No Barriers Identified   Patient Goals and CMS Choice CMS Medicare.gov Compare Post Acute Care list provided to:: Patient Choice offered to / list presented to : Patient  Discharge Placement                         Discharge Plan and Services Additional resources added to the After Visit Summary for                    DME Agency:  Carolynn Sayers)         [place]: [place] Date [place]: 08/10/22      Social Determinants of Health ([place]) Interventions [place]: No Food Insecurity (07/31/2022)  Recent Concern: [place] Present (06/02/2022)  Housing: Low Risk  (07/31/2022)  Transportation Needs: No Transportation Needs (07/31/2022)  Utilities: Not At Risk (07/31/2022)  Depression (PHQ2-9): Low Risk  (09/10/2021)  Tobacco Use: Low Risk  (08/05/2022)     Readmission Risk Interventions    08/02/2022   10:35 AM 05/29/2022   11:31 AM  Readmission Risk Prevention Plan  Transportation Screening Complete Complete  PCP or Specialist Appt within 5-7 Days  Complete  PCP or Specialist Appt within 3-5 Days Complete   Medication Review (RN CM)  Complete  HRI or Home Care Consult Complete   Social Work Consult for [place] Planning/Counseling Complete   Palliative Care Screening Not Applicable   Medication Review Press photographer) Complete

## 2022-08-10 NOTE — Care Management Important Message (Signed)
Important Message  Patient Details  Name: Ricardo Brown MRN: [id number] Date of Birth: October 11, 1960   Medicare Important Message Given:  Yes     Juliann Pulse A Emmanuelle Hibbitts 08/10/2022, 3:11 PM

## 2022-08-10 NOTE — Progress Notes (Signed)
Date of Admission:  07/30/2022      ID: Ricardo Brown is a 62 y.o. male  Principal Problem:   Osteomyelitis of left foot ([place]) Active Problems:   Type 2 diabetes mellitus with diabetic chronic kidney disease ([place])   Hypertension   Coronary artery disease   Chronic combined systolic (congestive) and diastolic (congestive) heart failure (HCC)   PAD (peripheral artery disease) (HCC)   Acute renal failure superimposed on stage 2 chronic kidney disease ([place]) Ricardo Brown is a 62 y.o. with a History of diabetes mellitus, CAD status post CABG,mitral valve repair, PVD. multiple amputation of toes of both feet because of, neurotrophic ulcers and gangrene presented to the hospital on 07/30/2022 with concerns for recurrent wound  infection and osteomyelitis of the left foot.   Has had extensive surgery to both feet. 06/05/2022 underwent left foot incision and drainage for foot abscess in the first interspace/second metatarsal phalangeal joint area and left second metatarsal phalangeal joint bone biopsy and bone culture.  Culture was negative. Was prescribed doxycycline   12/20/2021 underwent left second toe partial amputation and culture was positive for Staph aureus, group B strep and Acinetobacter   August 29, 2021 underwent amputation of the right second toe metatarsal phalangeal joint for gangrene with osteomyelitis culture was positive for Staph aureus, Enterobacter cloacae and Streptococcus agalactiae, Prevotella and Pasteurella canis.  Treated with oral antibiotics.   This time in the ED his vitals were BP of 128/72, temp 98.2, heart rate 77, respiratory 20.  WBC 9.4, Hb 12, platelet 302, creatinine 1.57. Blood culture was sent.  MRI of the left foot showed significant progression of septic arthritis of the second MTP joint with extensive osteomyelitis involving the second metatarsal head and neck and also the proximal phalanx.  There was surrounding fluid collection  consistent with abscess. There was septic arthritis at the proximal interphalangeal and of the fourth toe with extensive osteomyelitis involving the proximal and middle phalanges.  He was started on Zosyn and vancomycin. On 08/04/2022 underwent transmetatarsal amputation of the left foot.   Subjective: Working with PT Daughter at bed side   Medications:   amiodarone  200 mg Oral Daily   aspirin EC  81 mg Oral Daily   Chlorhexidine Gluconate Cloth  6 each Topical Daily   clopidogrel  75 mg Oral Daily   enoxaparin (LOVENOX) injection  0.5 mg/kg Subcutaneous Q24H   [START ON 08/11/2022] Fe Fum-Vit C-Vit B12-FA  1 capsule Oral QPC breakfast   gabapentin  100 mg Oral BID   insulin aspart  0-15 Units Subcutaneous TID WC   insulin aspart  0-5 Units Subcutaneous QHS   metoprolol succinate  50 mg Oral Daily   metroNIDAZOLE  500 mg Oral Q12H   pramipexole  1.5 mg Oral QHS   rosuvastatin  20 mg Oral Daily   sodium chloride flush  10-40 mL Intracatheter Q12H   torsemide  40 mg Oral Daily    Objective: Vital signs in last 24 hours: Temp:  [98 F (36.7 C)-98.2 F (36.8 C)] 98 F (36.7 C) (01/08 0811) Pulse Rate:  [63-67] 67 (01/08 0811) Resp:  [16-18] 16 (01/08 0811) BP: (123-153)/(47-74) 153/74 (01/08 0811) SpO2:  [97 %-99 %] 99 % (01/08 0811)    PHYSICAL EXAM:  General: Alert, cooperative, no distress, appears stated age.  Lungs: Clear to auscultation bilaterally. No Wheezing or Rhonchi. No rales. Heart: Regular rate and rhythm, no murmur, rub or gallop. Abdomen: Soft, non-tender,not distended.  Bowel sounds normal. No masses Extremities: left foot- TMA site well approximated Some swelling    Rt PICC Skin: No rashes or lesions. Or bruising Lymph: Cervical, supraclavicular normal. Neurologic: Grossly non-focal  Lab Results Recent Labs    08/09/22 0608 08/10/22 0252  WBC  --  6.5  HGB  --  8.5*  HCT  --  27.0*  NA 136 136  K 4.0 3.5  CL 104 103  CO2 23 25  BUN 49* 37*   CREATININE 1.52* 1.26*  Microbiology: Wound culture - Staph aureus    Assessment/Plan: ? Diabetic left foot infection with neuropathy and peripheral arterial disease.  Had an infection in November and it was drained and he was on p.o. doxycycline and was followed by podiatrist until it got worse. Underwent left TMA on 08/04/2022 Cultures so far is Staph aureus Pathology is chronic osteo at the proximal margin Will change unasyn to cefazolin until 08/31/22 PO flagyl until 08/20/22    Diabetes mellitus   History of CAD status post CABG   History of mitral valve repair   PAD with history of balloon angioplasty of the left tibioperoneal trunk, left anterior tibial artery, left posterior tibial artery in NOV 2023   Discussed the management with patient and his daughter and care team OPAT orders placed

## 2022-08-11 DIAGNOSIS — E11628 Type 2 diabetes mellitus with other skin complications: Secondary | ICD-10-CM | POA: Diagnosis not present

## 2022-08-11 DIAGNOSIS — N179 Acute kidney failure, unspecified: Secondary | ICD-10-CM | POA: Diagnosis not present

## 2022-08-11 DIAGNOSIS — I5042 Chronic combined systolic (congestive) and diastolic (congestive) heart failure: Secondary | ICD-10-CM | POA: Diagnosis not present

## 2022-08-11 DIAGNOSIS — M86172 Other acute osteomyelitis, left ankle and foot: Secondary | ICD-10-CM | POA: Diagnosis not present

## 2022-08-11 LAB — GLUCOSE, CAPILLARY: Glucose-Capillary: 207 mg/dL — ABNORMAL HIGH (ref 70–99)

## 2022-08-11 MED ORDER — HYDROCODONE-ACETAMINOPHEN 5-325 MG PO TABS: 1.0000 | ORAL_TABLET | ORAL | 0 refills | Status: DC | PRN

## 2022-08-11 MED ORDER — METRONIDAZOLE 500 MG PO TABS: 500.0000 mg | ORAL_TABLET | Freq: Two times a day (BID) | ORAL | 0 refills | Status: AC

## 2022-08-11 MED ORDER — FE FUM-VIT C-VIT B12-FA 460-60-0.01-1 MG PO CAPS: 1.0000 | ORAL_CAPSULE | Freq: Every day | ORAL | 0 refills | Status: DC

## 2022-08-11 MED ORDER — POLYETHYLENE GLYCOL 3350 17 G PO PACK: 17.0000 g | PACK | Freq: Every day | ORAL | 0 refills | Status: DC | PRN

## 2022-08-11 MED ORDER — CEFAZOLIN IV (FOR PTA / DISCHARGE USE ONLY): 2.0000 g | Freq: Three times a day (TID) | INTRAVENOUS | 0 refills | Status: AC

## 2022-08-11 NOTE — Discharge Summary (Signed)
Physician Discharge Summary   Patient: Ricardo Brown MRN: [id number] DOB: 1961/02/12  Admit date:     07/30/2022  Discharge date: 08/11/22  Discharge Physician: Lorella Nimrod   PCP: [name], MD   Recommendations at discharge:  Please obtain CBC and CMP in 1 week Follow-up with primary care provider Follow-up with podiatry Follow-up with infectious disease  Discharge Diagnoses: Principal Problem:   Osteomyelitis of left foot ([place]) Active Problems:   Acute renal failure superimposed on stage 2 chronic kidney disease ([place])   Chronic combined systolic (congestive) and diastolic (congestive) heart failure ([place])   Type 2 diabetes mellitus with diabetic chronic kidney disease ([place])   PAD (peripheral artery disease) ([place])   Hypertension   Coronary artery disease   Hospital Course: 62 year old male with past medical history of diabetes mellitus, poorly controlled, CAD status post CABG, PAD, chronic systolic/diastolic heart failure and obesity who was hospitalized 2 months ago for abscess of the first second interspace of foot and osteomyelitis of second MTP joint status post I&D who was sent to the ED on 12/28 for concerns of recurrent osteomyelitis and was admitted to the hospitalist service.  MRI done noted osteomyelitis in the left fourth toe as well as second toe and metatarsal.  Started on IV antibiotics and podiatry took patient to the OR on 1/2 for transmetatarsal amputation.  Pathology noted MSSA with chronic osteo at proximal margin, so in discussion with infectious disease/podiatry, plan is for patient to go home on IV antibiotics, ID is recommending continuation of cefazolin until 08/31/2022 and continuation of metronidazole until 08/20/2022.   PICC line was placed as well as home antibiotics. PT/OT was obtained on 08/10/2022 and they are recommending home health services which were ordered.  Home health services were ordered.  Patient requested scooter as he will be  nonweightbearing on left lower extremity and unable to use crutches.  Patient need to pick up scooter from [facility] location.  Directions were provided.  Patient will continue with rest of his home medications and need to have a close follow-up with his providers for further recommendations.  Assessment and Plan: * Osteomyelitis of left foot ([place]) History of left foot I&D, left second MTP joint with bone biopsy and culture on 06/05/22 with negative cultures s/p doxycycline Foot x-ray, left showed changes consistent with early osteomyelitis ABIs from November showed resting ABI within normal limits on the right, on the left midrange arterial occlusive disease.   Zosyn and vancomycin ordered Podiatry consult to consider angiogram     Acute renal failure superimposed on stage 2 chronic kidney disease ([place])  creatinine1.57, slightly above baseline of 1.18 a month ago IV hydration and monitor  Chronic combined systolic (congestive) and diastolic (congestive) heart failure ([place]) -2D echo 5/23 had shown EF of 45 to 50% Continue losartan Hold torsemide and Zaroxolyn due to AKI  Type 2 diabetes mellitus with diabetic chronic kidney disease (HCC) Sliding scale insulin coverage  PAD (peripheral artery disease) ([place]) History of left lower extremity angiogram on 11/6 Will hold Plavix given upcoming procedure.   Continue rosuvastatin  Hypertension BP stable, continue metoprolol, and losartan  Coronary artery disease No complaints of chest pain, EKG nonacute Continue rosuvastatin, aspirin and metoprolol.  Holding Plavix for procedure    Pain control - [facility] Controlled Substance Reporting System database was reviewed. and patient was instructed, not to drive, operate heavy machinery, perform activities at heights, swimming or participation in water activities or provide baby-sitting services while on  Pain, Sleep and Anxiety Medications; until their outpatient Physician has advised  to do so again. Also recommended to not to take more than prescribed Pain, Sleep and Anxiety Medications.  Consultants: Podiatry, infectious disease Procedures performed: Left transmetatarsal amputation Disposition: Home health Diet recommendation:  Discharge Diet Orders (From admission, onward)     Start     Ordered   08/11/22 0000  Diet - low sodium heart healthy        08/11/22 1044           Cardiac and Carb modified diet DISCHARGE MEDICATION: Allergies as of 08/11/2022       Reactions   Lipitor [atorvastatin] Palpitations   SVT   Sacubitril-valsartan Other (See Comments)   SVT Other reaction(s): Other (see comments) Other reaction(s): Other (See Comments) SVT SVT   Pregabalin Palpitations   SVT Other reaction(s): Other (see comments) SVT SVT        Medication List     STOP taking these medications    meloxicam 7.5 MG tablet Commonly known as: MOBIC       TAKE these medications    amiodarone 200 MG tablet Commonly known as: PACERONE Take 200 mg by mouth daily.   ascorbic acid 500 MG tablet Commonly known as: VITAMIN C Take 1 tablet (500 mg total) by mouth 2 (two) times daily.   aspirin EC 81 MG tablet Take 81 mg by mouth daily.   b complex vitamins tablet Take 1 tablet by mouth daily.   ceFAZolin  IVPB Commonly known as: ANCEF Inject 2 g into the vein every 8 (eight) hours for 21 days. Indication:  Osteomyelitis of the foot (MSSA) First Dose: Yes Last Day of Therapy:  08/31/2022 Labs - Once weekly:  CBC/D, CMP, CRP and ESR Please pull PIC at completion of IV antibiotics Fax weekly lab results  promptly to [phone] Method of administration: IV Push Method of administration may be changed at the discretion of home infusion pharmacist based upon assessment of the patient and/or caregiver's ability to self-administer the medication ordered.   clopidogrel 75 MG tablet Commonly known as: PLAVIX Take 1 tablet (75 mg total) by mouth  daily.   Dulaglutide 1.5 MG/0.5ML Sopn Inject 1.5 mg into the skin every Saturday.   empagliflozin 25 MG Tabs tablet Commonly known as: JARDIANCE Take 25 mg by mouth daily.   Fe Fum-Vit C-Vit B12-FA Caps capsule Commonly known as: TRIGELS-F FORTE Take 1 capsule by mouth daily after breakfast. Start taking on: August 12, 2022   Fish Oil 875 MG Caps Take 875 mg by mouth 2 (two) times daily.   gabapentin 100 MG capsule Commonly known as: NEURONTIN Take 1 capsule by mouth twice daily   HYDROcodone-acetaminophen 5-325 MG tablet Commonly known as: NORCO/VICODIN Take 1-2 tablets by mouth every 4 (four) hours as needed for moderate pain.   insulin degludec 200 UNIT/ML FlexTouch Pen Commonly known as: TRESIBA 32 Units at bedtime.   losartan 25 MG tablet Commonly known as: COZAAR Take 1 tablet (25 mg total) by mouth daily. HOLD UNTIL FOLLOW-UP WITH YOUR DOCTOR   metFORMIN 500 MG tablet Commonly known as: GLUCOPHAGE Take 2 tablets (1,000 mg total) by mouth 2 (two) times daily.   metolazone 2.5 MG tablet Commonly known as: ZAROXOLYN Take 1 tablet (2.5 mg total) by mouth daily. HOLD UNTIL FOLLOW-UP WITH YOUR DOCTOR   metoprolol succinate 50 MG 24 hr tablet Commonly known as: TOPROL-XL TAKE 1 TABLET BY MOUTH DAILY. TAKE WITH OR IMMEDIATELY FOLLOWING  A MEAL.   metroNIDAZOLE 500 MG tablet Commonly known as: FLAGYL Take 1 tablet (500 mg total) by mouth every 12 (twelve) hours for 20 doses.   polyethylene glycol 17 g packet Commonly known as: MIRALAX / GLYCOLAX Take 17 g by mouth daily as needed for moderate constipation.   pramipexole 1.5 MG tablet Commonly known as: MIRAPEX Take 1 tablet (1.5 mg total) by mouth 2 (two) times daily.   rosuvastatin 20 MG tablet Commonly known as: CRESTOR Take 1 tablet (20 mg total) by mouth daily.   torsemide 20 MG tablet Commonly known as: DEMADEX Take 2 tablets (40 mg total) by mouth daily. 40 mg daily               Durable  Medical Equipment  (From admission, onward)           Start     Ordered   08/10/22 1517  For home use only DME 3 n 1  Once        08/10/22 1517   08/10/22 1252  For home use only DME Walker rolling  Once       Question Answer Comment  Walker: With 5 Inch Wheels   Patient needs a walker to treat with the following condition Generalized weakness      08/10/22 1252   08/10/22 1251  For home use only DME Other see comment  Once       Comments: Knee scooter. Patient will be nonweightbearing on left lower extremity due to transmetatarsal amputation.  Unable to use crutches  Question:  Length of Need  Answer:  6 Months   08/10/22 1250              Discharge Care Instructions  (From admission, onward)           Start     Ordered   08/11/22 0000  Change dressing on IV access line weekly and PRN  (Home infusion instructions - [facility] )        08/11/22 1044   08/11/22 0000  Leave dressing on - Keep it clean, dry, and intact until clinic visit        08/11/22 1044            Follow-up Information     [name], MD. Schedule an appointment as soon as possible for a visit in 1 week(s).   Specialties: Internal Medicine, Cardiology Contact information: [address] [address] [phone]         Sharlotte Alamo, DPM. Schedule an appointment as soon as possible for a visit in 1 week(s).   Specialty: Podiatry Contact information: [place] [address] [phone]                Discharge Exam: Filed Weights   07/30/22 1427 08/04/22 1140  Weight: 94.8 kg 94.8 kg   General.     In no acute distress. Pulmonary.  Lungs clear bilaterally, normal respiratory effort. CV.  Regular rate and rhythm, no JVD, rub or murmur. Abdomen.  Soft, nontender, nondistended, BS positive. CNS.  Alert and oriented .  No focal neurologic deficit. Extremities.  No edema, left foot with Ace wrap Psychiatry.  Judgment and insight  appears normal.   Condition at discharge: stable  The results of significant diagnostics from this hospitalization (including imaging, microbiology, ancillary and laboratory) are listed below for reference.   Imaging Studies: DG Chest Port 1 View  Result Date: 08/08/2022 CLINICAL DATA:  PICC placement EXAM: PORTABLE CHEST 1 VIEW COMPARISON:  11/11/2018 FINDINGS: Cardiomegaly status post median sternotomy and CABG. Mild diffuse bilateral interstitial pulmonary opacity. Right upper extremity PICC, tip positioned near the superior cavoatrial junction. Osseous structures unremarkable. IMPRESSION: 1. Cardiomegaly with mild diffuse bilateral interstitial pulmonary opacity, likely edema. 2. Right upper extremity PICC, tip positioned near the superior cavoatrial junction. Electronically Signed   By: Delanna Ahmadi M.D.   On: 08/08/2022 16:36   [place] EKG SITE RITE  Result Date: 08/07/2022 If Site Rite image not attached, placement could not be confirmed due to current cardiac rhythm.  MR FOOT LEFT WO CONTRAST  Result Date: 07/31/2022 CLINICAL DATA:  Diabetic foot wound. EXAM: MRI OF THE LEFT FOOT WITHOUT CONTRAST TECHNIQUE: Multiplanar, multisequence MR imaging of the left foot was performed. No intravenous contrast was administered. COMPARISON:  Prior MRI 06/04/2022 and x-rays from yesterday. FINDINGS: Significant progression of septic arthritis at the second MTP joint with extensive osteomyelitis involving the second metatarsal head and neck and also the proximal phalanx. Surrounding fluid collection consistent with an abscess extending back along the second metatarsal. Septic arthritis at the PIP joint of the fourth toe with extensive osteomyelitis involving the proximal and middle phalanges. No septic arthritis at the fourth MTP joint. Diffuse cellulitis and extensive myofasciitis most notably involving the plantar foot musculature. I do not see a discrete fluid collection but could not exclude pyomyositis  without contrast. The visualized midfoot bony structures are intact. IMPRESSION: 1. Significant progression of septic arthritis at the second MTP joint with extensive osteomyelitis involving the second metatarsal head and neck and also the proximal phalanx. Surrounding fluid collection consistent with an abscess extending back along the second metatarsal. 2. Septic arthritis at the PIP joint of the fourth toe with extensive osteomyelitis involving the proximal and middle phalanges. 3. Diffuse cellulitis and extensive myofasciitis most notably involving the plantar foot musculature. I do not see a discrete fluid collection but could not exclude pyomyositis without contrast. Electronically Signed   By: Marijo Sanes M.D.   On: 07/31/2022 11:50   DG Foot Complete Left  Result Date: 07/30/2022 CLINICAL DATA:  Diabetic foot infection EXAM: LEFT FOOT - COMPLETE 3+ VIEW COMPARISON:  06/02/2022 FINDINGS: No acute fracture or dislocation. Prior resection of the second toe distal phalanx. There is periarticular osteopenia of the fourth toe middle phalanx adjacent to the PIP joint. There is also osteopenia of the second metatarsal head and base of the second toe proximal phalanx. Possible developing erosion along the medial aspect of the first metatarsal head. Generalized soft tissue swelling. Atherosclerotic vascular calcifications. No soft tissue gas. IMPRESSION: 1. Periarticular osteopenia of the fourth toe middle phalanx, as well as the second metatarsal head and base of the second toe proximal phalanx. Developing osteomyelitis is a concern given the clinical history. Consider MRI for further evaluation. 2. Possible developing erosion along the medial aspect of the first metatarsal head. Electronically Signed   By: Davina Poke D.O.   On: 07/30/2022 15:19    Microbiology: Results for orders placed or performed during the hospital encounter of 07/30/22  Culture, blood (routine x 2)     Status: None    Collection Time: 07/30/22  6:48 PM   Specimen: BLOOD  Result Value Ref Range Status   Specimen Description BLOOD LEFT ANTECUBITAL  Final   Special Requests   Final    BOTTLES DRAWN AEROBIC AND ANAEROBIC Blood Culture adequate volume   Culture   Final    NO  GROWTH 5 DAYS Performed at [facility], [address] [place], [place] [address]    Report Status 08/04/2022 FINAL  Final  Culture, blood (routine x 2)     Status: None   Collection Time: 07/30/22  7:00 PM   Specimen: BLOOD  Result Value Ref Range Status   Specimen Description BLOOD RIGHT ANTECUBITAL  Final   Special Requests   Final    BOTTLES DRAWN AEROBIC AND ANAEROBIC Blood Culture adequate volume   Culture   Final    NO GROWTH 5 DAYS Performed at [facility], [address] [place], [place] [address]    Report Status 08/04/2022 FINAL  Final  Surgical pcr screen     Status: None   Collection Time: 08/03/22 12:47 PM   Specimen: Nasal Mucosa; Nasal Swab  Result Value Ref Range Status   MRSA, PCR NEGATIVE NEGATIVE Final   Staphylococcus aureus NEGATIVE NEGATIVE Final    Comment: (NOTE) The Xpert SA Assay (FDA approved for NASAL specimens in patients 78 years of age and older), is one component of a comprehensive surveillance program. It is not intended to diagnose infection nor to guide or monitor treatment. Performed at [facility], [address] [place], [place] [address]   Aerobic/Anaerobic Culture w Gram Stain (surgical/deep wound)     Status: None   Collection Time: 08/04/22  1:24 PM   Specimen: PATH Amputaion Arm/Leg; Tissue  Result Value Ref Range Status   Specimen Description   Final    WOUND Performed at [facility], [address], [place], [place] [address]    Special Requests   Final    AMP ARM LEG Performed at [facility], [address] [place], [place] [address]    Gram Stain   Final    RARE WBC PRESENT, PREDOMINANTLY PMN NO ORGANISMS  SEEN    Culture   Final    RARE STAPHYLOCOCCUS AUREUS NO ANAEROBES ISOLATED Performed at [facility], [place] [address] [place], [place] [address]    Report Status 08/09/2022 FINAL  Final   Organism ID, Bacteria STAPHYLOCOCCUS AUREUS  Final      Susceptibility   Staphylococcus aureus - MIC*    CIPROFLOXACIN <=0.5 SENSITIVE Sensitive     ERYTHROMYCIN RESISTANT Resistant     GENTAMICIN <=0.5 SENSITIVE Sensitive     OXACILLIN 0.5 SENSITIVE Sensitive     TETRACYCLINE <=1 SENSITIVE Sensitive     VANCOMYCIN 1 SENSITIVE Sensitive     TRIMETH/SULFA <=10 SENSITIVE Sensitive     CLINDAMYCIN RESISTANT Resistant     RIFAMPIN <=0.5 SENSITIVE Sensitive     Inducible Clindamycin POSITIVE Resistant     * RARE STAPHYLOCOCCUS AUREUS    Labs: CBC: Recent Labs  Lab 08/05/22 0319 08/06/22 0115 08/07/22 0353 08/10/22 0252  WBC 10.6* 9.6 8.8 6.5  HGB 9.1* 8.6* 8.6* 8.5*  HCT 28.6* 27.6* 27.0* 27.0*  MCV 89.4 91.7 90.0 88.5  PLT 247 236 222 654   Basic Metabolic Panel: Recent Labs  Lab 08/06/22 0116 08/07/22 0353 08/08/22 0444 08/09/22 0608 08/10/22 0252  NA 134* 130* 132* 136 136  K 3.9 3.9 4.2 4.0 3.5  CL 107 103 101 104 103  CO2 21* '22 23 23 25  '$ GLUCOSE 178* 153* 136* 137* 219*  BUN 29* 40* 48* 49* 37*  CREATININE 1.65* 1.76* 1.75* 1.52* 1.26*  CALCIUM 7.8* 8.0* 8.6* 8.7* 8.2*   Liver Function Tests: No results for input(s): "AST", "ALT", "ALKPHOS", "BILITOT", "PROT", "  ALBUMIN" in the last 168 hours. CBG: Recent Labs  Lab 08/10/22 0813 08/10/22 1230 08/10/22 1753 08/10/22 2059 08/11/22 0846  GLUCAP 141* 153* 194* 189* 207*    Discharge time spent: greater than 30 minutes.  This record has been created using Systems analyst. Errors have been sought and corrected,but may not always be located. Such creation errors do not reflect on the standard of care.   Signed: Lorella Nimrod, MD [facility] 08/11/2022

## 2022-08-11 NOTE — Progress Notes (Signed)
Patient discharged home with family . All belonging gathered. PICC line in place for home infusions. AVS printed, given and reviewed. All questions answered.

## 2022-08-11 NOTE — Progress Notes (Signed)
7 Days Post-Op   Subjective/Chief Complaint: Patient seen.  No complaints.  States he is going home today.   Objective: Vital signs in last 24 hours: Temp:  [98.1 F (36.7 C)-98.3 F (36.8 C)] 98.1 F (36.7 C) (01/09 0849) Pulse Rate:  [63-72] 72 (01/09 0849) Resp:  [16-18] 16 (01/09 0849) BP: (134-154)/(71-74) 154/71 (01/09 0849) SpO2:  [94 %-98 %] 98 % (01/09 0849) Last BM Date : 08/10/22  Intake/Output from previous day: 01/08 0701 - 01/09 0700 In: 460 [P.O.:360; IV Piggyback:100] Out: 2250 [Urine:2250] Intake/Output this shift: No intake/output data recorded.  The bandages dry and intact.  A small amount of drainage is noted from the central aspect of the incision area.  Upon removal the incision is well coapted with skin edges viable.  Small partially deroofed blister noted.  No deeper extension or expressible fluid.     Lab Results:  Recent Labs    08/10/22 0252  WBC 6.5  HGB 8.5*  HCT 27.0*  PLT 253   BMET Recent Labs    08/09/22 0608 08/10/22 0252  NA 136 136  K 4.0 3.5  CL 104 103  CO2 23 25  GLUCOSE 137* 219*  BUN 49* 37*  CREATININE 1.52* 1.26*  CALCIUM 8.7* 8.2*   PT/INR No results for input(s): "LABPROT", "INR" in the last 72 hours. ABG No results for input(s): "PHART", "HCO3" in the last 72 hours.  Invalid input(s): "PCO2", "PO2"  Studies/Results: No results found.  Anti-infectives: Anti-infectives (From admission, onward)    Start     Dose/Rate Route Frequency Ordered Stop   08/10/22 1300  ceFAZolin (ANCEF) IVPB 2g/100 mL premix        2 g 200 mL/hr over 30 Minutes Intravenous Every 8 hours 08/10/22 1041     08/10/22 1300  metroNIDAZOLE (FLAGYL) tablet 500 mg        500 mg Oral Every 12 hours 08/10/22 1041 08/20/22 2359   08/07/22 1630  vancomycin (VANCOCIN) IVPB 1000 mg/200 mL premix        1,000 mg 200 mL/hr over 60 Minutes Intravenous  Once 08/07/22 1539 08/07/22 1809   08/07/22 0000  Ampicillin-Sulbactam (UNASYN) 3 g in  sodium chloride 0.9 % 100 mL IVPB  Status:  Discontinued        3 g 200 mL/hr over 30 Minutes Intravenous Every 6 hours 08/06/22 1523 08/10/22 1041   08/06/22 0745  vancomycin variable dose per unstable renal function (pharmacist dosing)  Status:  Discontinued         Does not apply See admin instructions 08/06/22 0745 08/08/22 1441   08/05/22 1500  vancomycin (VANCOREADY) IVPB 1250 mg/250 mL  Status:  Discontinued        1,250 mg 166.7 mL/hr over 90 Minutes Intravenous Every 12 hours 08/05/22 0432 08/05/22 0751   08/05/22 1500  vancomycin (VANCOREADY) IVPB 750 mg/150 mL  Status:  Discontinued        750 mg 150 mL/hr over 60 Minutes Intravenous Every 12 hours 08/05/22 0751 08/06/22 0745   08/04/22 1400  vancomycin (VANCOCIN) IVPB 750 mg/150 ml premix  Status:  Discontinued        750 mg 150 mL/hr over 60 Minutes Intravenous Every 12 hours 08/04/22 1329 08/05/22 0432   08/04/22 1312  piperacillin-tazobactam (ZOSYN) 3.375 GM/50ML IVPB       Note to Pharmacy: Maryagnes Amos B: cabinet override      08/04/22 1312 08/05/22 1810   08/02/22 1400  vancomycin (VANCOREADY) IVPB 750 mg/150  mL  Status:  Discontinued        750 mg 150 mL/hr over 60 Minutes Intravenous Every 12 hours 08/02/22 1234 08/04/22 1329   08/01/22 1300  vancomycin (VANCOREADY) IVPB 1250 mg/250 mL  Status:  Discontinued        1,250 mg 166.7 mL/hr over 90 Minutes Intravenous Every 24 hours 07/31/22 1618 08/02/22 1234   07/31/22 2200  piperacillin-tazobactam (ZOSYN) IVPB 3.375 g  Status:  Discontinued        3.375 g 12.5 mL/hr over 240 Minutes Intravenous Every 8 hours 07/31/22 1445 08/06/22 1523   07/31/22 1100  piperacillin-tazobactam (ZOSYN) IVPB 3.375 g        3.375 g 100 mL/hr over 30 Minutes Intravenous  Once 07/31/22 1046 07/31/22 1641   07/31/22 1100  vancomycin (VANCOREADY) IVPB 1500 mg/300 mL        1,500 mg 150 mL/hr over 120 Minutes Intravenous  Once 07/31/22 1056 07/31/22 1641   07/30/22 1745  vancomycin  (VANCOCIN) IVPB 1000 mg/200 mL premix        1,000 mg 200 mL/hr over 60 Minutes Intravenous  Once 07/30/22 1743 07/30/22 2054   07/30/22 1745  piperacillin-tazobactam (ZOSYN) IVPB 3.375 g        3.375 g 100 mL/hr over 30 Minutes Intravenous  Once 07/30/22 1743 07/30/22 2028       Assessment/Plan: s/p Procedure(s): TRANSMETATARSAL AMPUTATION (Left) Assessment: Stable status post transmetatarsal amputation left foot.  Plan: Betadine gauze applied to the incision followed by a sterile gauze bandage.  Foot appears stable.  Plan for follow-up next week outpatient for reevaluation.  LOS: 12 days    Ricardo Brown 08/11/2022

## 2022-08-11 NOTE — Plan of Care (Signed)
  Problem: Education: Goal: Knowledge of General Education information will improve Description: Including pain rating scale, medication(s)/side effects and non-pharmacologic comfort measures Outcome: Progressing   Problem: Health Behavior/Discharge Planning: Goal: Ability to manage health-related needs will improve Outcome: Progressing   Problem: Clinical Measurements: Goal: Ability to maintain clinical measurements within normal limits will improve Outcome: Progressing Goal: Diagnostic test results will improve Outcome: Progressing Goal: Cardiovascular complication will be avoided Outcome: Progressing   Problem: Activity: Goal: Risk for activity intolerance will decrease Outcome: Progressing   Problem: Nutrition: Goal: Adequate nutrition will be maintained Outcome: Progressing   Problem: Elimination: Goal: Will not experience complications related to bowel motility Outcome: Progressing Goal: Will not experience complications related to urinary retention Outcome: Progressing   Problem: Pain Managment: Goal: General experience of comfort will improve Outcome: Progressing   

## 2022-08-11 NOTE — Progress Notes (Signed)
Physical Therapy Treatment Patient Details Name: Ricardo Brown MRN: [id number] DOB: 06-15-1961 Today's Date: 08/11/2022   History of Present Illness Pt is a 61 y.o. male presenting to hospital 07/30/22 with wound infection.  Pt admitted with L foot osteomyelitis.  S/p L foot TMA 08/04/2022.  PMH includes htn, DM, CAD s/p CABG, CHF, CKD, R 2nd toe amputation.    PT Comments    Pt resting in bed upon PT arrival; agreeable to therapy session; pt's wife and daughter present.  During session pt modified independent with bed mobility; SBA with transfers using RW; CGA with transfers using knee scooter; CGA ambulating 50 feet with RW (limited distance d/t SOB and fatigue--pt reports d/t CHF); and SBA navigating knee scooter 160 feet.  Pt able to maintain [place] status entire session.  No c/o pain.  Discussed options for stair training using walker--pt's family feeling forwards would be better but pt feeling backwards would be better--ultimately decided to utilize chair method as pt/family educated on yesterday (pt's daughter reports they will have strong family members come to assist pt with steps into home for safety).  Pt's family report DME company stated they would have to go to store in [facility] in order to get knee scooter (would not be delivered; pt's family did not want to drive to [facility]) so pt's family called [employer] and reported they had one that family was going to pick up today instead (since it was local).  Reviewed safe DME use and how to properly adjust DME for appropriate fit; also reviewed current assist levels and pacing/activity modifications: pt and family appearing with good understanding.  Pt and pt's family report no further questions/concerns for home discharge today.   Recommendations for follow up therapy are one component of a multi-disciplinary discharge planning process, led by the attending physician.  Recommendations may be updated based on patient status,  additional functional criteria and insurance authorization.  Follow Up Recommendations  Home health PT     Assistance Recommended at Discharge Frequent or constant Supervision/Assistance  Patient can return home with the following A little help with walking and/or transfers;A little help with bathing/dressing/bathroom;Assistance with cooking/housework;Assist for transportation;Help with stairs or ramp for entrance   Equipment Recommendations  Rolling walker (2 wheels);BSC/3in1;Other (comment) (knee scooter)    Recommendations for Other Services       Precautions / Restrictions Precautions Precautions: Fall Restrictions Weight Bearing Restrictions: Yes LLE Weight Bearing: Non weight bearing     Mobility  Bed Mobility Overal bed mobility: Modified Independent                  Transfers Overall transfer level: Needs assistance Equipment used: Rolling walker (2 wheels) Transfers: Sit to/from Stand Sit to Stand: Supervision           General transfer comment: steady with transfer using RW (x1 trial from bed); stand pivot recliner to/from knee scooter CGA (occasional vc's for scooter positioning and locking brakes)    Ambulation/Gait Ambulation/Gait assistance: Min guard Gait Distance (Feet): 50 Feet Assistive device: Rolling walker (2 wheels)   Gait velocity: decreased     General Gait Details: initial vc's for positioning within walker; L LE NWB'ing; steady   Stairs             Wheelchair Mobility    Modified Rankin (Stroke Patients Only)       Balance Overall balance assessment: Needs assistance Sitting-balance support: No upper extremity supported, Feet supported Sitting balance-Leahy Scale: Normal  Sitting balance - Comments: steady sitting reaching outside BOS   Standing balance support: Bilateral upper extremity supported, During functional activity, Reliant on assistive device for balance Standing balance-Leahy Scale: Fair Standing  balance comment: steady static standing with B UE support                            Cognition Arousal/Alertness: Awake/alert Behavior During Therapy: WFL for tasks assessed/performed Overall Cognitive Status: Within Functional Limits for tasks assessed                                          Exercises      General Comments General comments (skin integrity, edema, etc.): no drainage noted L foot dressings.      Pertinent Vitals/Pain Pain Assessment Pain Assessment: No/denies pain Vitals (HR and O2 on room air) stable and WFL throughout treatment session.    Home Living                          Prior Function            PT Goals (current goals can now be found in the care plan section) Acute Rehab PT Goals Patient Stated Goal: to improve mobility PT Goal Formulation: With patient Time For Goal Achievement: 08/24/22 Potential to Achieve Goals: Good Progress towards PT goals: Progressing toward goals    Frequency    7X/week      PT Plan Current plan remains appropriate    Co-evaluation              AM-PAC PT "6 Clicks" Mobility   Outcome Measure  Help needed turning from your back to your side while in a flat bed without using bedrails?: None Help needed moving from lying on your back to sitting on the side of a flat bed without using bedrails?: None Help needed moving to and from a bed to a chair (including a wheelchair)?: A Little Help needed standing up from a chair using your arms (e.g., wheelchair or bedside chair)?: A Little Help needed to walk in hospital room?: A Little Help needed climbing 3-5 steps with a railing? : A Lot 6 Click Score: 19    End of Session Equipment Utilized During Treatment: Gait belt Activity Tolerance: Patient tolerated treatment well Patient left: in bed;with call bell/phone within reach;with family/visitor present;Other (comment) (L LE elevated on 2 pillows) Nurse Communication:  Mobility status;Precautions;Weight bearing status PT Visit Diagnosis: Unsteadiness on feet (R26.81);Other abnormalities of gait and mobility (R26.89);Muscle weakness (generalized) (M62.81);Pain Pain - Right/Left: Left Pain - part of body: Ankle and joints of foot     Time: 0930-1001 PT Time Calculation (min) (ACUTE ONLY): 31 min  Charges:  $Gait Training: 8-22 mins $Therapeutic Activity: 8-22 mins                     Hawken Bielby, PT 08/11/22, 11:08 AM

## 2022-08-13 ENCOUNTER — Other Ambulatory Visit: Payer: Self-pay | Admitting: Internal Medicine

## 2022-08-13 DIAGNOSIS — E119 Type 2 diabetes mellitus without complications: Secondary | ICD-10-CM

## 2022-08-20 ENCOUNTER — Encounter: Payer: Self-pay | Admitting: Infectious Diseases

## 2022-08-20 ENCOUNTER — Ambulatory Visit: Payer: Medicare Other | Attending: Infectious Diseases | Admitting: Infectious Diseases

## 2022-08-20 VITALS — BP 142/84 | HR 105 | Temp 97.8°F | Ht 68.0 in | Wt 211.0 lb

## 2022-08-20 DIAGNOSIS — E11628 Type 2 diabetes mellitus with other skin complications: Secondary | ICD-10-CM | POA: Diagnosis not present

## 2022-08-20 DIAGNOSIS — E1151 Type 2 diabetes mellitus with diabetic peripheral angiopathy without gangrene: Secondary | ICD-10-CM | POA: Diagnosis not present

## 2022-08-20 DIAGNOSIS — E1142 Type 2 diabetes mellitus with diabetic polyneuropathy: Secondary | ICD-10-CM | POA: Insufficient documentation

## 2022-08-20 DIAGNOSIS — Z951 Presence of aortocoronary bypass graft: Secondary | ICD-10-CM | POA: Diagnosis not present

## 2022-08-20 DIAGNOSIS — Z7984 Long term (current) use of oral hypoglycemic drugs: Secondary | ICD-10-CM | POA: Diagnosis not present

## 2022-08-20 DIAGNOSIS — M00872 Arthritis due to other bacteria, left ankle and foot: Secondary | ICD-10-CM | POA: Diagnosis not present

## 2022-08-20 DIAGNOSIS — L089 Local infection of the skin and subcutaneous tissue, unspecified: Secondary | ICD-10-CM | POA: Diagnosis not present

## 2022-08-20 DIAGNOSIS — I10 Essential (primary) hypertension: Secondary | ICD-10-CM | POA: Diagnosis not present

## 2022-08-20 DIAGNOSIS — I2510 Atherosclerotic heart disease of native coronary artery without angina pectoris: Secondary | ICD-10-CM | POA: Insufficient documentation

## 2022-08-20 DIAGNOSIS — I739 Peripheral vascular disease, unspecified: Secondary | ICD-10-CM | POA: Insufficient documentation

## 2022-08-20 DIAGNOSIS — L0889 Other specified local infections of the skin and subcutaneous tissue: Secondary | ICD-10-CM | POA: Diagnosis present

## 2022-08-20 NOTE — Patient Instructions (Signed)
You are here for follow up of the left foot infection- you currently are on cefazolin IV. You will complete on 08/31/22 and the PICC can be removed on 09/01/22. Follow up with Dr.Cline

## 2022-08-20 NOTE — Progress Notes (Signed)
NAME: Ricardo Brown  DOB: May 06, 1961  MRN: [id number]  Date/Time: 08/20/2022 10:21 AM  Subjective:  Pt here with daughter.  ? Ricardo Brown is a 62 y.o. male with a history of DM, HTN  CAD status post CABG,mitral valve repair, PVD. multiple amputation of toes of both feet because of, neurotrophic ulcers and gangrene  is here after hospital discharge for left foot infection for which he underwent TMA on 08/04/22 and culture MSSA- the proximal margin showed chronic osteo and patient was sent home on IV antibiotics to complete 4 weeks on 08/31/22 Dr.Cline following him- Today I could not see the wound as he was told to leave the dressing intact for a week- As per Dr.Cline's last note on 1/16 the surgical site looks very well HE is adherent to IV cefazolin- not missed any dose  Past history Has had extensive surgery to both feet. 06/05/2022 underwent left foot incision and drainage for foot abscess in the first interspace/second metatarsal phalangeal joint area and left second metatarsal phalangeal joint bone biopsy and bone culture.  Culture was negative. Was prescribed doxycycline   12/20/2021 underwent left second toe partial amputation and culture was positive for Staph aureus, group B strep and Acinetobacter   August 29, 2021 underwent amputation of the right second toe metatarsal phalangeal joint for gangrene with osteomyelitis culture was positive for Staph aureus, Enterobacter cloacae and Streptococcus agalactiae, Prevotella and Pasteurella canis.  Treated with oral antibiotics.  Past Medical History:  Diagnosis Date   Chronic combined systolic (congestive) and diastolic (congestive) heart failure ([place])    a. 01/2018 EF 35-40%; b. 10/2020 Echo: EF 40-45%, glob HK. RVSP 42.85mHg. Mildly dil LA. Mild MS/AoV sclerosis.   CKD (chronic kidney disease), stage Brown (HCC)    Coronary artery disease    a. 01/2018 late presenting inferior MI; b. 01/2018 Cath: Severe multivessel  dzs-->CABG x 3 (LIMA->LAD, VG->OM2, VG->RPDA @ [facility]).   Diabetes [place] complication ([place]    a. Dx ~ 2000.   Heart palpitations    a. Pt reports prior nl echo's and stress tests. Last stress test w/in past 2 yrs - PCP.   High cholesterol    Hypertension    Mitral regurgitation    a. 01/2018 s/p MV repair @ time of CABG.   Myocardial infarction ([facility]    Postoperative atrial fibrillation    a. 01/2018 @ time of CABG.   PSVT (paroxysmal supraventricular tachycardia)    a. Very symptomatic with multiple ED evaluations.  Has been on amio.   Severe sepsis with acute organ dysfunction ([place] 12/19/2021   Sleep difficulties 09/07/2019   STEMI (ST elevation myocardial infarction) ([place] 01/24/2018    Past Surgical History:  Procedure Laterality Date   AMPUTATION TOE Right 08/29/2021   Procedure: AMPUTATION TOE-2nd toe;  Surgeon: CSharlotte Alamo DPM;  Location: [facility] ORS;  Service: Podiatry;  Laterality: Right;   AMPUTATION TOE Left 12/22/2021   Procedure: AMPUTATION TOE-2nd Toe Partial;  Surgeon: BCaroline More DPM;  Location: [facility];  Service: Podiatry;  Laterality: Left;   CARDIAC CATHETERIZATION     CARDIAC VALVE REPLACEMENT     Mitral Valve Repair   COLONOSCOPY WITH PROPOFOL N/A 06/07/2019   Procedure: COLONOSCOPY WITH PROPOFOL;  Surgeon: TVirgel Manifold MD;  Location: [facility]OS[facility];  Service: Endoscopy;  Laterality: N/A;   COLONOSCOPY WITH PROPOFOL N/A 02/07/2020   Procedure: COLONOSCOPY WITH PROPOFOL;  Surgeon: TVirgel Manifold MD;  Location: [facility] ENDOS[facility];  Service: Endoscopy;  Laterality:  N/A;   COLONOSCOPY WITH PROPOFOL N/A 12/11/2020   Procedure: COLONOSCOPY WITH PROPOFOL;  Surgeon: [name], MD;  Location: [facility] ENDOSCOPY;  Service: Endoscopy;  Laterality: N/A;   CORONARY ANGIOPLASTY     CORONARY ARTERY BYPASS GRAFT     x 3   01/2018   ESOPHAGOGASTRODUODENOSCOPY N/A 02/07/2020   Procedure: ESOPHAGOGASTRODUODENOSCOPY (EGD);  Surgeon: [name], MD;   Location: [facility] ENDOSCOPY;  Service: Endoscopy;  Laterality: N/A;   INCISION AND DRAINAGE Left 06/05/2022   Procedure: INCISION AND DRAINAGE;  Surgeon: Caroline More, DPM;  Location: [facility]S;  Service: Podiatry;  Laterality: Left;   LEFT HEART CATH AND CORONARY ANGIOGRAPHY N/A 01/24/2018   Procedure: LEFT HEART CATH AND CORONARY ANGIOGRAPHY;  Surgeon: [name], MD;  Location: [place] CV LAB;  Service: Cardiovascular;  Laterality: N/A;   LOWER EXTREMITY ANGIOGRAPHY Left 06/08/2022   Procedure: Lower Extremity Angiography;  Surgeon: Algernon Huxley, MD;  Location: [place] CV LAB;  Service: Cardiovascular;  Laterality: Left;   SVT ABLATION N/A 09/12/2021   Procedure: SVT ABLATION;  Surgeon: Vickie Epley, MD;  Location: [place] CV LAB;  Service: Cardiovascular;  Laterality: N/A;   TONSILLECTOMY     TRANSMETATARSAL AMPUTATION Left 08/04/2022   Procedure: TRANSMETATARSAL AMPUTATION;  Surgeon: Sharlotte Alamo, DPM;  Location: [facility]S;  Service: Podiatry;  Laterality: Left;    Social History   Socioeconomic History   Marital status: Married    Spouse name: Not on file   Number of children: Not on file   Years of education: Not on file   Highest education level: Not on file  Occupational History   Not on file  Tobacco Use   Smoking status: Never   Smokeless tobacco: Never  Vaping Use   Vaping Use: Never used  Substance and Sexual Activity   Alcohol use: Not Currently    Comment: rare beer   Drug use: No   Sexual activity: Not on file  Other Topics Concern   Not on file  Social History Narrative   Lives locally with wife.  Works in [employer].  Does not routinely exercise.   Social Determinants of Health   Financial Resource Strain: Not on file  Food Insecurity: No Food Insecurity (07/31/2022)   Hunger Vital Sign    Worried About Running Out of Food in the Last Year: Never true    Ran Out of Food in the Last Year: Never true  Recent Concern: Food Insecurity -  Food Insecurity Present (06/02/2022)   Hunger Vital Sign    Worried About Running Out of Food in the Last Year: Sometimes true    Ran Out of Food in the Last Year: Sometimes true  Transportation Needs: No Transportation Needs (07/31/2022)   PRAPARE - Hydrologist (Medical): No    Lack of Transportation (Non-Medical): No  Physical Activity: Not on file  Stress: Not on file  Social Connections: Not on file  Intimate Partner Violence: Not At Risk (07/31/2022)   Humiliation, Afraid, Rape, and Kick questionnaire    Fear of Current or Ex-Partner: No    Emotionally Abused: No    Physically Abused: No    Sexually Abused: No    Family History  Problem Relation Age of Onset   Cancer Mother        died @ 74   CAD Father        First MI @ 1. S/p heart transplant. Died in mid-50's  of cancer.   Cancer Father    Other Brother        alive and well   Allergies  Allergen Reactions   Lipitor [Atorvastatin] Palpitations    SVT   Sacubitril-Valsartan Other (See Comments)    SVT Other reaction(s): Other (see comments) Other reaction(s): Other (See Comments) SVT SVT   Pregabalin Palpitations    SVT Other reaction(s): Other (see comments) SVT SVT   I? Current Outpatient Medications  Medication Sig Dispense Refill   amiodarone (PACERONE) 200 MG tablet Take 200 mg by mouth daily.     ascorbic acid (VITAMIN C) 500 MG tablet Take 1 tablet (500 mg total) by mouth 2 (two) times daily. 60 tablet 3   aspirin 81 MG EC tablet Take 81 mg by mouth daily.      b complex vitamins tablet Take 1 tablet by mouth daily.      ceFAZolin (ANCEF) IVPB Inject 2 g into the vein every 8 (eight) hours for 21 days. Indication:  Osteomyelitis of the foot (MSSA) First Dose: Yes Last Day of Therapy:  08/31/2022 Labs - Once weekly:  CBC/D, CMP, CRP and ESR Please pull PIC at completion of IV antibiotics Fax weekly lab results  promptly to [phone] Method of administration: IV  Push Method of administration may be changed at the discretion of home infusion pharmacist based upon assessment of the patient and/or caregiver's ability to self-administer the medication ordered. 63 Units 0   clopidogrel (PLAVIX) 75 MG tablet Take 1 tablet (75 mg total) by mouth daily. 30 tablet 3   Dulaglutide 1.5 MG/0.5ML SOPN Inject 1.5 mg into the skin every Saturday.     empagliflozin (JARDIANCE) 25 MG TABS tablet Take 25 mg by mouth daily.     Fe Fum-Vit C-Vit B12-FA (TRIGELS-F FORTE) CAPS capsule Take 1 capsule by mouth daily after breakfast. 90 capsule 0   gabapentin (NEURONTIN) 100 MG capsule Take 1 capsule by mouth twice daily 60 capsule 0   HYDROcodone-acetaminophen (NORCO/VICODIN) 5-325 MG tablet Take 1-2 tablets by mouth every 4 (four) hours as needed for moderate pain. 30 tablet 0   insulin degludec (TRESIBA) 200 UNIT/ML FlexTouch Pen 32 Units at bedtime.     losartan (COZAAR) 25 MG tablet Take 1 tablet (25 mg total) by mouth daily. HOLD UNTIL FOLLOW-UP WITH YOUR DOCTOR     metFORMIN (GLUCOPHAGE) 500 MG tablet Take 2 tablets (1,000 mg total) by mouth 2 (two) times daily. 360 tablet 2   metolazone (ZAROXOLYN) 2.5 MG tablet Take 1 tablet (2.5 mg total) by mouth daily. HOLD UNTIL FOLLOW-UP WITH YOUR DOCTOR 30 tablet 4   metoprolol succinate (TOPROL-XL) 50 MG 24 hr tablet TAKE 1 TABLET BY MOUTH DAILY. TAKE WITH OR IMMEDIATELY FOLLOWING A MEAL. 90 tablet 0   metroNIDAZOLE (FLAGYL) 500 MG tablet Take 1 tablet (500 mg total) by mouth every 12 (twelve) hours for 20 doses. 20 tablet 0   Omega-3 Fatty Acids (FISH OIL) 875 MG CAPS Take 875 mg by mouth 2 (two) times daily.     polyethylene glycol (MIRALAX / GLYCOLAX) 17 g packet Take 17 g by mouth daily as needed for moderate constipation. 14 each 0   pramipexole (MIRAPEX) 1.5 MG tablet Take 1 tablet (1.5 mg total) by mouth 2 (two) times daily. 180 tablet 3   rosuvastatin (CRESTOR) 20 MG tablet Take 1 tablet (20 mg total) by mouth daily. 90  tablet 0   torsemide (DEMADEX) 20 MG tablet Take 2 tablets (40 mg  total) by mouth daily. 40 mg daily 180 tablet 0   No current facility-administered medications for this visit.     Abtx:  Anti-infectives (From admission, onward)    None       REVIEW OF SYSTEMS:  Const: negative fever, negative chills, negative weight loss Eyes: negative diplopia or visual changes, negative eye pain ENT: negative coryza, negative sore throat Resp: negative cough, hemoptysis, dyspnea Cards: negative for chest pain, palpitations, lower extremity edema GU: negative for frequency, dysuria and hematuria GI: Negative for abdominal pain, diarrhea, bleeding, constipation Skin: negative for rash and pruritus Heme: negative for easy bruising and gum/nose bleeding MS: negative for myalgias, arthralgias, back pain and muscle weakness Neurolo:negative for headaches, dizziness, vertigo, memory problems  Psych: negative for feelings of anxiety, depression  Endocrine: negative for thyroid, diabetes Allergy/Immunology- negative for any medication or food allergies ? Pertinent Positives include : Objective:  VITALS:  BP (!) 142/84   Pulse (!) 105   Temp 97.8 F (36.6 C) (Oral)   Ht '5\' 8"'$  (1.727 m)   Wt 211 lb (95.7 kg)   SpO2 98%   BMI 32.08 kg/m  LDA Foley Central line Other drainage tubes PHYSICAL EXAM:  General: Alert, cooperative, no distress, appears stated age.  Head: Normocephalic, without obvious abnormality, atraumatic. Eyes: Conjunctivae clear, anicteric sclerae. Pupils are equal ENT Nares normal. No drainage or sinus tenderness. Lips, mucosa, and tongue normal. No Thrush Neck: Supple, symmetrical, no adenopathy, thyroid: non tender no carotid bruit and no JVD. Back: No CVA tenderness. Lungs: Clear to auscultation bilaterally. No Wheezing or Rhonchi. No rales. Heart: Regular rate and rhythm, no murmur, rub or gallop. Abdomen: Soft, non-tender,not distended. Bowel sounds normal. No  masses Extremities: atraumatic, no cyanosis. No edema. No clubbing Skin: No rashes or lesions. Or bruising Lymph: Cervical, supraclavicular normal. Neurologic: Grossly non-focal Pertinent Labs Lab Results CBC    Component Value Date/Time   WBC 6.5 08/10/2022 0252   RBC 3.05 (L) 08/10/2022 0252   HGB 8.5 (L) 08/10/2022 0252   HGB 13.0 08/20/2021 0632   HCT 27.0 (L) 08/10/2022 0252   HCT 38.0 08/20/2021 0632   PLT 253 08/10/2022 0252   PLT 197 08/20/2021 0632   MCV 88.5 08/10/2022 0252   MCV 89 08/20/2021 0632   MCH 27.9 08/10/2022 0252   MCHC 31.5 08/10/2022 0252   RDW 14.1 08/10/2022 0252   RDW 12.9 08/20/2021 0632   LYMPHSABS 1.4 07/30/2022 1435   LYMPHSABS 0.9 08/20/2021 0632   MONOABS 0.7 07/30/2022 1435   EOSABS 0.2 07/30/2022 1435   EOSABS 0.1 08/20/2021 0632   BASOSABS 0.1 07/30/2022 1435   BASOSABS 0.1 08/20/2021 0632       Latest Ref Rng & Units 08/10/2022    2:52 AM 08/09/2022    6:08 AM 08/08/2022    4:44 AM  CMP  Glucose 70 - 99 mg/dL 219  137  136   BUN 8 - 23 mg/dL 37  49  48   Creatinine 0.61 - 1.24 mg/dL 1.26  1.52  1.75   Sodium 135 - 145 mmol/L 136  136  132   Potassium 3.5 - 5.1 mmol/L 3.5  4.0  4.2   Chloride 98 - 111 mmol/L 103  104  101   CO2 22 - 32 mmol/L '25  23  23   '$ Calcium 8.9 - 10.3 mg/dL 8.2  8.7  8.6       Microbiology: No results found for this or any previous visit (from the  past 240 hour(s)).  IMAGING RESULTS: I have personally reviewed the films ? Impression/Recommendation ? ? ? ___________________________________________________ Discussed with patient, requesting provider Note:  This document was prepared using Dragon voice recognition software and may include unintentional dictation errors.NAME: Ricardo Brown  DOB: July 20, 1961  MRN: [id number]  Date/Time: 08/20/2022 10:21 AM  REQUESTING PROVIDER Subjective:  REASON FOR CONSULT:  ? Ricardo Brown is a 62 y.o. with a history of   ID    Steroid/immune suppressants/splenectomy/Hardware Recent Procedure Surgery Injections Trauma Sick contacts Travel Antibiotic use Food- raw/exotic Animal bites Tick exposure Water sports Fishing/hunting/animal bird exposure Past Medical History:  Diagnosis Date   Chronic combined systolic (congestive) and diastolic (congestive) heart failure ([place])    a. 01/2018 EF 35-40%; b. 10/2020 Echo: EF 40-45%, glob HK. RVSP 42.53mHg. Mildly dil LA. Mild MS/AoV sclerosis.   CKD (chronic kidney disease), stage Brown (HCC)    Coronary artery disease    a. 01/2018 late presenting inferior MI; b. 01/2018 Cath: Severe multivessel dzs-->CABG x 3 (LIMA->LAD, VG->OM2, VG->RPDA @ [facility]).   Diabetes mellitus without complication ([place]    a. Dx ~ 2000.   Heart palpitations    a. Pt reports prior nl echo's and stress tests. Last stress test w/in past 2 yrs - PCP.   High cholesterol    Hypertension    Mitral regurgitation    a. 01/2018 s/p MV repair @ time of CABG.   Myocardial infarction ([facility]    Postoperative atrial fibrillation    a. 01/2018 @ time of CABG.   PSVT (paroxysmal supraventricular tachycardia)    a. Very symptomatic with multiple ED evaluations.  Has been on amio.   Severe sepsis with acute organ dysfunction ([place] 12/19/2021   Sleep difficulties 09/07/2019   STEMI (ST elevation myocardial infarction) ([place] 01/24/2018    Past Surgical History:  Procedure Laterality Date   AMPUTATION TOE Right 08/29/2021   Procedure: AMPUTATION TOE-2nd toe;  Surgeon: CSharlotte Alamo DPM;  Location: [facility];  Service: Podiatry;  Laterality: Right;   AMPUTATION TOE Left 12/22/2021   Procedure: AMPUTATION TOE-2nd Toe Partial;  Surgeon: BCaroline More DPM;  Location: [facility];  Service: Podiatry;  Laterality: Left;   CARDIAC CATHETERIZATION     CARDIAC VALVE REPLACEMENT     Mitral Valve Repair   COLONOSCOPY WITH PROPOFOL N/A 06/07/2019   Procedure: COLONOSCOPY WITH PROPOFOL;  Surgeon: TVirgel Manifold MD;   Location: [facility] END[facility];  Service: Endoscopy;  Laterality: N/A;   COLONOSCOPY WITH PROPOFOL N/A 02/07/2020   Procedure: COLONOSCOPY WITH PROPOFOL;  Surgeon: TVirgel Manifold MD;  Location: [facility];  Service: Endoscopy;  Laterality: N/A;   COLONOSCOPY WITH PROPOFOL N/A 12/11/2020   Procedure: COLONOSCOPY WITH PROPOFOL;  Surgeon: TVirgel Manifold MD;  Location: [facility];  Service: Endoscopy;  Laterality: N/A;   CORONARY ANGIOPLASTY     CORONARY ARTERY BYPASS GRAFT     x 3   01/2018   ESOPHAGOGASTRODUODENOSCOPY N/A 02/07/2020   Procedure: ESOPHAGOGASTRODUODENOSCOPY (EGD);  Surgeon: TVirgel Manifold MD;  [facility]  Service: Endoscopy;  Laterality: N/A;   INCISION AND DRAINAGE Left 06/05/2022   Procedure: INCISION AND DRAINAGE;  Surgeon: BCaroline More DPM;  Location: [facility];  Service: Podiatry;  Laterality: Left;   LEFT HEART CATH AND CORONARY ANGIOGRAPHY N/A 01/24/2018   Procedure: LEFT HEART CATH AND CORONARY ANGIOGRAPHY;  Surgeon: AWellington Hampshire MD;  [facility];  Service: Cardiovascular;  Laterality: N/A;   LOWER EXTREMITY ANGIOGRAPHY  Left 06/08/2022   Procedure: Lower Extremity Angiography;  Surgeon: Algernon Huxley, MD;  Location: [place] CV LAB;  Service: Cardiovascular;  Laterality: Left;   SVT ABLATION N/A 09/12/2021   Procedure: SVT ABLATION;  Surgeon: Vickie Epley, MD;  Location: [place] CV LAB;  Service: Cardiovascular;  Laterality: N/A;   TONSILLECTOMY     TRANSMETATARSAL AMPUTATION Left 08/04/2022   Procedure: TRANSMETATARSAL AMPUTATION;  Surgeon: Sharlotte Alamo, DPM;  Location: [facility];  Service: Podiatry;  Laterality: Left;    Social History   Socioeconomic History   Marital status: Married    Spouse name: Not on file   Number of children: Not on file   Years of education: Not on file   Highest education level: Not on file  Occupational History   Not on file  Tobacco Use   Smoking status: Never    Smokeless tobacco: Never  Vaping Use   Vaping Use: Never used  Substance and Sexual Activity   Alcohol use: Not Currently    Comment: rare beer   Drug use: No   Sexual activity: Not on file  Other Topics Concern   Not on file  Social History Narrative   Lives locally with wife.  Works in [employer].  Does not routinely exercise.   Social Determinants of Health   Financial Resource Strain: Not on file  Food Insecurity: No Food Insecurity (07/31/2022)   Hunger Vital Sign    Worried About Running Out of Food in the Last Year: Never true    Ran Out of Food in the Last Year: Never true  Recent Concern: Food Insecurity - Food Insecurity Present (06/02/2022)   Hunger Vital Sign    Worried About Running Out of Food in the Last Year: Sometimes true    Ran Out of Food in the Last Year: Sometimes true  Transportation Needs: No Transportation Needs (07/31/2022)   PRAPARE - Hydrologist (Medical): No    Lack of Transportation (Non-Medical): No  Physical Activity: Not on file  Stress: Not on file  Social Connections: Not on file  Intimate Partner Violence: Not At Risk (07/31/2022)   Humiliation, Afraid, Rape, and Kick questionnaire    Fear of Current or Ex-Partner: No    Emotionally Abused: No    Physically Abused: No    Sexually Abused: No    Family History  Problem Relation Age of Onset   Cancer Mother        died @ 53   CAD Father        First MI @ 39. S/p heart transplant. Died in mid-50's of cancer.   Cancer Father    Other Brother        alive and well   Allergies  Allergen Reactions   Lipitor [Atorvastatin] Palpitations    SVT   Sacubitril-Valsartan Other (See Comments)    SVT Other reaction(s): Other (see comments) Other reaction(s): Other (See Comments) SVT SVT   Pregabalin Palpitations    SVT Other reaction(s): Other (see comments) SVT SVT   I? Current Outpatient Medications  Medication Sig Dispense Refill   amiodarone  (PACERONE) 200 MG tablet Take 200 mg by mouth daily.     ascorbic acid (VITAMIN C) 500 MG tablet Take 1 tablet (500 mg total) by mouth 2 (two) times daily. 60 tablet 3   aspirin 81 MG EC tablet Take 81 mg by mouth daily.      b complex vitamins tablet Take 1  tablet by mouth daily.      ceFAZolin (ANCEF) IVPB Inject 2 g into the vein every 8 (eight) hours for 21 days. Indication:  Osteomyelitis of the foot (MSSA) First Dose: Yes Last Day of Therapy:  08/31/2022 Labs - Once weekly:  CBC/D, CMP, CRP and ESR Please pull PIC at completion of IV antibiotics Fax weekly lab results  promptly to [phone] Method of administration: IV Push Method of administration may be changed at the discretion of home infusion pharmacist based upon assessment of the patient and/or caregiver's ability to self-administer the medication ordered. 63 Units 0   clopidogrel (PLAVIX) 75 MG tablet Take 1 tablet (75 mg total) by mouth daily. 30 tablet 3   Dulaglutide 1.5 MG/0.5ML SOPN Inject 1.5 mg into the skin every Saturday.     empagliflozin (JARDIANCE) 25 MG TABS tablet Take 25 mg by mouth daily.     Fe Fum-Vit C-Vit B12-FA (TRIGELS-F FORTE) CAPS capsule Take 1 capsule by mouth daily after breakfast. 90 capsule 0   gabapentin (NEURONTIN) 100 MG capsule Take 1 capsule by mouth twice daily 60 capsule 0   HYDROcodone-acetaminophen (NORCO/VICODIN) 5-325 MG tablet Take 1-2 tablets by mouth every 4 (four) hours as needed for moderate pain. 30 tablet 0   insulin degludec (TRESIBA) 200 UNIT/ML FlexTouch Pen 32 Units at bedtime.     losartan (COZAAR) 25 MG tablet Take 1 tablet (25 mg total) by mouth daily. HOLD UNTIL FOLLOW-UP WITH YOUR DOCTOR     metFORMIN (GLUCOPHAGE) 500 MG tablet Take 2 tablets (1,000 mg total) by mouth 2 (two) times daily. 360 tablet 2   metolazone (ZAROXOLYN) 2.5 MG tablet Take 1 tablet (2.5 mg total) by mouth daily. HOLD UNTIL FOLLOW-UP WITH YOUR DOCTOR 30 tablet 4   metoprolol succinate (TOPROL-XL) 50  MG 24 hr tablet TAKE 1 TABLET BY MOUTH DAILY. TAKE WITH OR IMMEDIATELY FOLLOWING A MEAL. 90 tablet 0   metroNIDAZOLE (FLAGYL) 500 MG tablet Take 1 tablet (500 mg total) by mouth every 12 (twelve) hours for 20 doses. 20 tablet 0   Omega-3 Fatty Acids (FISH OIL) 875 MG CAPS Take 875 mg by mouth 2 (two) times daily.     polyethylene glycol (MIRALAX / GLYCOLAX) 17 g packet Take 17 g by mouth daily as needed for moderate constipation. 14 each 0   pramipexole (MIRAPEX) 1.5 MG tablet Take 1 tablet (1.5 mg total) by mouth 2 (two) times daily. 180 tablet 3   rosuvastatin (CRESTOR) 20 MG tablet Take 1 tablet (20 mg total) by mouth daily. 90 tablet 0   torsemide (DEMADEX) 20 MG tablet Take 2 tablets (40 mg total) by mouth daily. 40 mg daily 180 tablet 0   No current facility-administered medications for this visit.     Abtx:  Anti-infectives (From admission, onward)    None       REVIEW OF SYSTEMS:  Const: negative fever, negative chills, negative weight loss Eyes: negative diplopia or visual changes, negative eye pain ENT: negative coryza, negative sore throat Resp: negative cough, hemoptysis, dyspnea Cards: negative for chest pain, palpitations, lower extremity edema GU: negative for frequency, dysuria and hematuria GI: Negative for abdominal pain, diarrhea, bleeding, constipation Skin: negative for rash and pruritus Heme: negative for easy bruising and gum/nose bleeding MS: does not bear weight on the left foot- uses knee scooter Neurolo:negative for headaches, dizziness, vertigo, memory problems  Psych: negative for feelings of anxiety, depression  Endocrine: negative for thyroid, diabetes Allergy/Immunology- as above ? Objective:  VITALS:  BP (!) 142/84   Pulse (!) 105   Temp 97.8 F (36.6 C) (Oral)   Ht '5\' 8"'$  (1.727 m)   Wt 211 lb (95.7 kg)   SpO2 98%   BMI 32.08 kg/m  LDA PICC line rt arm- site is fine  PHYSICAL EXAM:  General: Alert, cooperative, no distress, appears  stated age.  Head: Normocephalic, without obvious abnormality, atraumatic. Eyes: Conjunctivae clear, anicteric sclerae. Pupils are equal ENT Nares normal. No drainage or sinus tenderness. Lips, mucosa, and tongue normal. No Thrush Neck: Supple, symmetrical, no adenopathy, thyroid: non tender no carotid bruit and no JVD. Back: No CVA tenderness. Lungs: Clear to auscultation bilaterally. No Wheezing or Rhonchi. No rales. Heart: Regular rate and rhythm, no murmur, rub or gallop. Abdomen: Soft, non-tender,not distended. Bowel sounds normal. No masses Extremities:left foot surgical dressing not removed Previous picture of TMA shown below- celan and well coapted  Skin: No rashes or lesions. Or bruising Lymph: Cervical, supraclavicular normal. Neurologic: Grossly non-focal Pertinent Labs 08/18/22 WBC 8.9 HB 10.6 ( was 8.4) Cr 1.38 ESR 69 ( was 77) CRP 8 ( normal < 10)  ? Impression/Recommendation ?Diabetic left foot infection with neuropathy and peripheral arterial disease.  Had an infection in November and it was drained and he was on p.o. doxycycline and was followed by podiatrist until it got worse. Underwent left TMA on 08/04/2022 MSSA in culture Pathology is chronic osteo at the proximal margin On IV cefazolin until 08/31/22 PICC can be removed on 09/01/22- HE does not need further antibiotics unless Dr.Cline thinks he will need PO antibiotic he can prescribe keflex PO flagyl until 08/20/22  CRP normalized ESR improved   Diabetes mellitus   History of CAD status post CABG   History of mitral valve repair   PAD with history of balloon angioplasty of the left tibioperoneal trunk, left anterior tibial artery, left posterior tibial artery in NOV 2023 ? ? ___________________________________________________ Discussed with patient, and his daughter Follow PRN

## 2022-09-07 ENCOUNTER — Telehealth: Payer: Self-pay | Admitting: Cardiology

## 2022-09-07 ENCOUNTER — Other Ambulatory Visit: Payer: Self-pay

## 2022-09-07 NOTE — Telephone Encounter (Signed)
Please schedule F/U appt with Dr. Quentin Ore for 90 day refills. Thank you!

## 2022-09-07 NOTE — Telephone Encounter (Signed)
*  STAT* If patient is at the pharmacy, call can be transferred to refill team.   1. Which medications need to be refilled? (please list name of each medication and dose if known)   amiodarone (PACERONE) 200 MG tablet   2. Which pharmacy/location (including street and city if local pharmacy) is medication to be sent to?  [place], [place]    3. Do they need a 30 day or 90 day supply? [place]

## 2022-09-11 ENCOUNTER — Other Ambulatory Visit: Payer: Self-pay | Admitting: *Deleted

## 2022-09-11 MED ORDER — AMIODARONE HCL 200 MG PO TABS: 200.0000 mg | ORAL_TABLET | Freq: Every day | ORAL | 0 refills | Status: DC

## 2022-09-30 ENCOUNTER — Ambulatory Visit: Payer: Medicare Other | Attending: Cardiology

## 2022-09-30 DIAGNOSIS — I5020 Unspecified systolic (congestive) heart failure: Secondary | ICD-10-CM

## 2022-09-30 DIAGNOSIS — Z951 Presence of aortocoronary bypass graft: Secondary | ICD-10-CM | POA: Diagnosis not present

## 2022-09-30 LAB — ECHOCARDIOGRAM COMPLETE
AR max vel: 3.54 cm2
AV Area VTI: 2.92 cm2
AV Area mean vel: 3.28 cm2
AV Mean grad: 3 mmHg
AV Peak grad: 5.2 mmHg
Ao pk vel: 1.14 m/s
Area-P 1/2: 4.24 cm2
Calc EF: 44 %
MV VTI: 1.77 cm2
S' Lateral: 4.7 cm
Single Plane A2C EF: 42.1 %
Single Plane A4C EF: 45.2 %

## 2022-10-01 ENCOUNTER — Encounter: Payer: Self-pay | Admitting: Ophthalmology

## 2022-10-04 ENCOUNTER — Other Ambulatory Visit: Payer: Self-pay | Admitting: Cardiology

## 2022-10-04 DIAGNOSIS — I5020 Unspecified systolic (congestive) heart failure: Secondary | ICD-10-CM

## 2022-10-05 MED ORDER — TORSEMIDE 20 MG PO TABS: 40.0000 mg | ORAL_TABLET | Freq: Every day | ORAL | 0 refills | Status: DC

## 2022-10-05 MED ORDER — AMIODARONE HCL 200 MG PO TABS: 200.0000 mg | ORAL_TABLET | Freq: Every day | ORAL | 0 refills | Status: DC

## 2022-10-06 NOTE — Discharge Instructions (Signed)

## 2022-10-08 ENCOUNTER — Ambulatory Visit: Payer: 59 | Admitting: Anesthesiology

## 2022-10-08 ENCOUNTER — Encounter: Payer: Self-pay | Admitting: Ophthalmology

## 2022-10-08 ENCOUNTER — Ambulatory Visit
Admission: RE | Admit: 2022-10-08 | Discharge: 2022-10-08 | Disposition: A | Discharge status: Home / Self Care | Payer: 59 | Attending: Ophthalmology | Admitting: Ophthalmology

## 2022-10-08 ENCOUNTER — Encounter
Admission: RE | Disposition: A | Discharge status: Home / Self Care | Payer: Self-pay | Source: Home / Self Care | Attending: Ophthalmology

## 2022-10-08 ENCOUNTER — Other Ambulatory Visit: Payer: Self-pay

## 2022-10-08 DIAGNOSIS — Z89421 Acquired absence of other right toe(s): Secondary | ICD-10-CM | POA: Insufficient documentation

## 2022-10-08 DIAGNOSIS — Z794 Long term (current) use of insulin: Secondary | ICD-10-CM | POA: Diagnosis not present

## 2022-10-08 DIAGNOSIS — E1136 Type 2 diabetes mellitus with diabetic cataract: Secondary | ICD-10-CM | POA: Insufficient documentation

## 2022-10-08 DIAGNOSIS — I4891 Unspecified atrial fibrillation: Secondary | ICD-10-CM | POA: Diagnosis not present

## 2022-10-08 DIAGNOSIS — N183 Chronic kidney disease, stage 3 unspecified: Secondary | ICD-10-CM | POA: Diagnosis not present

## 2022-10-08 DIAGNOSIS — E7800 Pure hypercholesterolemia, unspecified: Secondary | ICD-10-CM | POA: Diagnosis not present

## 2022-10-08 DIAGNOSIS — I252 Old myocardial infarction: Secondary | ICD-10-CM | POA: Diagnosis not present

## 2022-10-08 DIAGNOSIS — I2510 Atherosclerotic heart disease of native coronary artery without angina pectoris: Secondary | ICD-10-CM | POA: Diagnosis not present

## 2022-10-08 DIAGNOSIS — Z951 Presence of aortocoronary bypass graft: Secondary | ICD-10-CM | POA: Insufficient documentation

## 2022-10-08 DIAGNOSIS — Z7984 Long term (current) use of oral hypoglycemic drugs: Secondary | ICD-10-CM | POA: Diagnosis not present

## 2022-10-08 DIAGNOSIS — Z6831 Body mass index (BMI) 31.0-31.9, adult: Secondary | ICD-10-CM | POA: Insufficient documentation

## 2022-10-08 DIAGNOSIS — H2512 Age-related nuclear cataract, left eye: Secondary | ICD-10-CM | POA: Diagnosis not present

## 2022-10-08 DIAGNOSIS — I5042 Chronic combined systolic (congestive) and diastolic (congestive) heart failure: Secondary | ICD-10-CM | POA: Insufficient documentation

## 2022-10-08 DIAGNOSIS — E1122 Type 2 diabetes mellitus with diabetic chronic kidney disease: Secondary | ICD-10-CM | POA: Insufficient documentation

## 2022-10-08 DIAGNOSIS — Z89422 Acquired absence of other left toe(s): Secondary | ICD-10-CM | POA: Diagnosis not present

## 2022-10-08 DIAGNOSIS — G4730 Sleep apnea, unspecified: Secondary | ICD-10-CM | POA: Diagnosis not present

## 2022-10-08 DIAGNOSIS — I130 Hypertensive heart and chronic kidney disease with heart failure and stage 1 through stage 4 chronic kidney disease, or unspecified chronic kidney disease: Secondary | ICD-10-CM | POA: Diagnosis not present

## 2022-10-08 HISTORY — PX: CATARACT EXTRACTION W/PHACO: SHX586

## 2022-10-08 HISTORY — DX: Sleep apnea, unspecified: G47.30

## 2022-10-08 HISTORY — DX: Presence of dental prosthetic device (complete) (partial): Z97.2

## 2022-10-08 HISTORY — DX: Polyneuropathy, unspecified: G62.9

## 2022-10-08 LAB — GLUCOSE, CAPILLARY: Glucose-Capillary: 157 mg/dL — ABNORMAL HIGH (ref 70–99)

## 2022-10-08 SURGERY — PHACOEMULSIFICATION, CATARACT, WITH IOL INSERTION
Anesthesia: Monitor Anesthesia Care | Site: Eye | Laterality: Left

## 2022-10-08 MED ORDER — LACTATED RINGERS IV SOLN: INTRAVENOUS | Status: DC

## 2022-10-08 MED ORDER — ARMC OPHTHALMIC DILATING DROPS
1.0000 | OPHTHALMIC | Status: DC | PRN
  Administered 2022-10-08 (×3): 1 via OPHTHALMIC

## 2022-10-08 MED ORDER — MIDAZOLAM HCL 2 MG/2ML IJ SOLN
INTRAMUSCULAR | Status: DC | PRN
  Administered 2022-10-08: 2 mg via INTRAVENOUS

## 2022-10-08 MED ORDER — FENTANYL CITRATE (PF) 100 MCG/2ML IJ SOLN
INTRAMUSCULAR | Status: DC | PRN
  Administered 2022-10-08 (×2): 50 ug via INTRAVENOUS

## 2022-10-08 MED ORDER — MOXIFLOXACIN HCL 0.5 % OP SOLN
OPHTHALMIC | Status: DC | PRN
  Administered 2022-10-08: .2 mL via OPHTHALMIC

## 2022-10-08 MED ORDER — LIDOCAINE HCL (PF) 2 % IJ SOLN
INTRAOCULAR | Status: DC | PRN
  Administered 2022-10-08: 4 mL via INTRAOCULAR

## 2022-10-08 MED ORDER — SIGHTPATH DOSE#1 BSS IO SOLN
INTRAOCULAR | Status: DC | PRN
  Administered 2022-10-08: 15 mL via INTRAOCULAR

## 2022-10-08 MED ORDER — BSS IO SOLN
INTRAOCULAR | Status: DC | PRN
  Administered 2022-10-08: 15 mL via INTRAOCULAR

## 2022-10-08 MED ORDER — TETRACAINE HCL 0.5 % OP SOLN
1.0000 [drp] | OPHTHALMIC | Status: DC | PRN
  Administered 2022-10-08 (×3): 1 [drp] via OPHTHALMIC

## 2022-10-08 MED ORDER — BRIMONIDINE TARTRATE-TIMOLOL 0.2-0.5 % OP SOLN
OPHTHALMIC | Status: DC | PRN
  Administered 2022-10-08: 1 [drp] via OPHTHALMIC

## 2022-10-08 MED ORDER — SIGHTPATH DOSE#1 BSS IO SOLN
INTRAOCULAR | Status: DC | PRN
  Administered 2022-10-08: 120 mL via OPHTHALMIC

## 2022-10-08 MED ORDER — SIGHTPATH DOSE#1 NA HYALUR & NA CHOND-NA HYALUR IO KIT
PACK | INTRAOCULAR | Status: DC | PRN
  Administered 2022-10-08: 1 via OPHTHALMIC

## 2022-10-08 SURGICAL SUPPLY — 14 items
CANNULA ANT/CHMB 27G (MISCELLANEOUS) IMPLANT
CANNULA ANT/CHMB 27GA (MISCELLANEOUS) IMPLANT
CATARACT SUITE SIGHTPATH (MISCELLANEOUS) ×1 IMPLANT
DISSECTOR HYDRO NUCLEUS 50X22 (MISCELLANEOUS) ×1 IMPLANT
DRSG TEGADERM 2-3/8X2-3/4 SM (GAUZE/BANDAGES/DRESSINGS) ×1 IMPLANT
FEE CATARACT SUITE SIGHTPATH (MISCELLANEOUS) ×1 IMPLANT
GLOVE SURG SYN 7.5  E (GLOVE) ×1
GLOVE SURG SYN 7.5 E (GLOVE) ×1 IMPLANT
GLOVE SURG SYN 7.5 PF PI (GLOVE) ×1 IMPLANT
GLOVE SURG SYN 8.5  E (GLOVE) ×1
GLOVE SURG SYN 8.5 E (GLOVE) ×1 IMPLANT
GLOVE SURG SYN 8.5 PF PI (GLOVE) ×1 IMPLANT
LENS IOL TECNIS EYHANCE 27.0 (Intraocular Lens) IMPLANT
WATER STERILE IRR 250ML POUR (IV SOLUTION) ×1 IMPLANT

## 2022-10-08 NOTE — Anesthesia Preprocedure Evaluation (Signed)
Anesthesia Evaluation  Patient identified by MRN, date of birth, ID band Patient awake    Reviewed: Allergy & Precautions, NPO status , Patient's Chart, lab work & pertinent test results  History of Anesthesia Complications Negative for: history of anesthetic complications  Airway Mallampati: III  TM Distance: >3 FB Neck ROM: full    Dental  (+) Edentulous Upper, Edentulous Lower, Dental Advidsory Given   Pulmonary neg shortness of breath, sleep apnea , neg COPD, neg recent URI   Pulmonary exam normal  + decreased breath sounds      Cardiovascular Exercise Tolerance: Poor hypertension, Pt. on medications (-) angina + CAD, + Past MI, + CABG, + Peripheral Vascular Disease, +CHF and + DOE  Normal cardiovascular exam(-) dysrhythmias  Rhythm:Regular     Neuro/Psych negative neurological ROS  negative psych ROS   GI/Hepatic negative GI ROS, Neg liver ROS,,,  Endo/Other  negative endocrine ROSdiabetes, Type 1, Insulin Dependent  Morbid obesity  Renal/GU CRFRenal disease     Musculoskeletal   Abdominal  (+) + obese  Peds negative pediatric ROS (+)  Hematology negative hematology ROS (+) Blood dyscrasia, anemia   Anesthesia Other Findings Past Medical History: No date: Chronic combined systolic (congestive) and diastolic  (congestive) heart failure (HCC)     Comment:  a. 01/2018 EF 35-40%; b. 10/2020 Echo: EF 40-45%, glob HK.              RVSP 42.57mHg. Mildly dil LA. Mild MS/AoV sclerosis. No date: CKD (chronic kidney disease), stage III (HCC) No date: Coronary artery disease     Comment:  a. 01/2018 late presenting inferior MI; b. 01/2018 Cath:               Severe multivessel dzs-->CABG x 3 (LIMA->LAD, VG->OM2,               VG->RPDA @ [facility]). No date: Diabetes mellitus without complication (HCC)     Comment:  a. Dx ~ 2000. No date: Heart palpitations     Comment:  a. Pt reports prior nl echo's and stress tests. Last                stress test w/in past 2 yrs - PCP. No date: High cholesterol No date: Hypertension No date: Mitral regurgitation     Comment:  a. 01/2018 s/p MV repair @ time of CABG. No date: Myocardial infarction ([facility] No date: Postoperative atrial fibrillation     Comment:  a. 01/2018 @ time of CABG. No date: PSVT (paroxysmal supraventricular tachycardia)     Comment:  a. Very symptomatic with multiple ED evaluations.  Has               been on amio. 12/19/2021: Severe [place] organ dysfunction [place] 09/07/2019: Sleep difficulties 01/24/2018: STEMI (ST elevation myocardial infarction) ([facility]  Past Surgical History: 08/29/2021: AMPUTATION TOE; Right     Comment:  Procedure: AMPUTATION TOE-2nd toe;  Surgeon: CSharlotte Alamo DPM;  Location: [facility] ORS;  Service: Podiatry;                Laterality: Right; 12/22/2021: AMPUTATION TOE; Left     Comment:  Procedure: AMPUTATION TOE-2nd Toe Partial;  Surgeon:               BCaroline More DPM;  Location: [facility] ORS;  Service:  Podiatry;  Laterality: Left; No date: CARDIAC CATHETERIZATION No date: CARDIAC VALVE REPLACEMENT     Comment:  Mitral Valve Repair 06/07/2019: COLONOSCOPY WITH PROPOFOL; N/A     Comment:  Procedure: COLONOSCOPY WITH PROPOFOL;  Surgeon:               [name], MD;  Location: [facility] ENDOSCOPY;                Service: Endoscopy;  Laterality: N/A; 02/07/2020: COLONOSCOPY WITH PROPOFOL; N/A     Comment:  Procedure: COLONOSCOPY WITH PROPOFOL;  Surgeon:               [name], MD;  Location: [facility] ENDOSCOPY;                Service: Endoscopy;  Laterality: N/A; 12/11/2020: COLONOSCOPY WITH PROPOFOL; N/A     Comment:  Procedure: COLONOSCOPY WITH PROPOFOL;  Surgeon:               [name], MD;  Location: [facility] ENDOSCOPY;                Service: Endoscopy;  Laterality: N/A; No date: CORONARY ANGIOPLASTY No date: CORONARY ARTERY BYPASS GRAFT     Comment:  x 3    01/2018 02/07/2020: ESOPHAGOGASTRODUODENOSCOPY; N/A     Comment:  Procedure: ESOPHAGOGASTRODUODENOSCOPY (EGD);  Surgeon:               [name], MD;  Location: [facility] ENDOSCOPY;                Service: Endoscopy;  Laterality: N/A; 06/05/2022: INCISION AND DRAINAGE; Left     Comment:  Procedure: INCISION AND DRAINAGE;  Surgeon: Caroline More, DPM;  Location: [facility] ORS;  Service: Podiatry;                Laterality: Left; 01/24/2018: LEFT HEART CATH AND CORONARY ANGIOGRAPHY; N/A     Comment:  Procedure: LEFT HEART CATH AND CORONARY ANGIOGRAPHY;                Surgeon: [name], MD;  Location: [place]               CV LAB;  Service: Cardiovascular;  Laterality: N/A; 06/08/2022: LOWER EXTREMITY ANGIOGRAPHY; Left     Comment:  Procedure: Lower Extremity Angiography;  Surgeon: Algernon Huxley, MD;  Location: [place] CV LAB;  Service:               Cardiovascular;  Laterality: Left; 09/12/2021: SVT ABLATION; N/A     Comment:  Procedure: SVT ABLATION;  Surgeon: Vickie Epley,               MD;  Location: [place] CV LAB;  Service:               Cardiovascular;  Laterality: N/A; No date: TONSILLECTOMY  BMI    Body Mass Index: 31.78 kg/m      Reproductive/Obstetrics negative OB ROS                             Anesthesia Physical Anesthesia Plan  ASA: 4  Anesthesia Plan: MAC   Post-op Pain Management:    Induction: Intravenous  PONV Risk Score and Plan: 1  and Midazolam and Treatment may vary due to age or medical condition  Airway Management Planned: Natural Airway and Nasal Cannula  Additional Equipment:   Intra-op Plan:   Post-operative Plan:   Informed Consent: I have reviewed the patients History and Physical, chart, labs and discussed the procedure including the risks, benefits and alternatives for the proposed anesthesia with the patient or authorized representative who has indicated his/her  understanding and acceptance.       Plan Discussed with: CRNA and Surgeon  Anesthesia Plan Comments:        Anesthesia Quick Evaluation

## 2022-10-08 NOTE — Op Note (Signed)
OPERATIVE NOTE  JAWAD GIACOMELLI III [id number] 10/08/2022   PREOPERATIVE DIAGNOSIS: Nuclear sclerotic cataract left eye. H25.12   POSTOPERATIVE DIAGNOSIS: Nuclear sclerotic cataract left eye. H25.12   PROCEDURE:  Phacoemusification with posterior chamber intraocular lens placement of the left eye  Ultrasound time: Procedure(s) with comments: CATARACT EXTRACTION PHACO AND INTRAOCULAR LENS PLACEMENT (IOC) LEFT DIABETIC OMIDRIA 20.94 01:35.9 (Left) - Diabetic  LENS:   Implant Name Type Inv. Item Serial No. Manufacturer Lot No. LRB No. Used Action  LENS IOL TECNIS EYHANCE 27.0 - [id number] Intraocular Lens LENS IOL TECNIS EYHANCE 27.0 [id number] [employer]  Left 1 Implanted      SURGEON:  Courtney Heys. Lazarus Salines, MD   ANESTHESIA:  Topical with tetracaine drops, augmented with 1% preservative-free intracameral lidocaine.   COMPLICATIONS:  None.   DESCRIPTION OF PROCEDURE:  The patient was identified in the holding room and transported to the operating room and placed in the supine position under the operating microscope.  The left eye was identified as the operative eye, which was prepped and draped in the usual sterile ophthalmic fashion.   A 1 millimeter clear-corneal paracentesis was made inferotemporally. Preservative-free 1% lidocaine mixed with 1:1,000 bisulfite-free aqueous solution of epinephrine was injected into the anterior chamber. The anterior chamber was then filled with Viscoat viscoelastic. A 2.4 millimeter keratome was used to make a clear-corneal incision superotemporally. A curvilinear capsulorrhexis was made with a cystotome and capsulorrhexis forceps. Balanced salt solution was used to hydrodissect and hydrodelineate the nucleus. Phacoemulsification was then used to remove the lens nucleus and epinucleus. The remaining cortex was then removed using the irrigation and aspiration handpiece. Provisc was then placed into the capsular bag to distend it for lens placement. A +27.00 D  [id number] intraocular lens was then injected into the capsular bag. The remaining viscoelastic was aspirated.   Wounds were hydrated with balanced salt solution.  The anterior chamber was inflated to a physiologic pressure with balanced salt solution.  No wound leaks were noted. Vigamox was injected intracamerally.  Timolol and Brimonidine drops were applied to the eye.  The patient was taken to the recovery room in stable condition without complications of anesthesia or surgery.  Maryann [facility] 10/08/2022, 12:24 PM

## 2022-10-08 NOTE — Transfer of Care (Signed)
Immediate Anesthesia Transfer of Care Note  Patient: Ricardo Brown  Procedure(s) Performed: CATARACT EXTRACTION PHACO AND INTRAOCULAR LENS PLACEMENT (IOC) LEFT DIABETIC OMIDRIA 20.94 01:35.9 (Left: Eye)  Patient Location: PACU  Anesthesia Type: MAC  Level of Consciousness: awake, alert  and patient cooperative  Airway and Oxygen Therapy: Patient Spontanous Breathing and Patient connected to supplemental oxygen  Post-op Assessment: Post-op Vital signs reviewed, Patient's Cardiovascular Status Stable, Respiratory Function Stable, Patent Airway and No signs of Nausea or vomiting  Post-op Vital Signs: Reviewed and stable  Complications: No notable events documented.

## 2022-10-08 NOTE — H&P (Signed)
[place]   Primary Care Physician:  [name], MD Ophthalmologist: Dr. Merleen Nicely  Pre-Procedure History & Physical: HPI:  Ricardo Brown is a 62 y.o. male here for cataract surgery.   Past Medical History:  Diagnosis Date   Chronic combined systolic (congestive) and diastolic (congestive) heart failure ([place])    a. 01/2018 EF 35-40%; b. 10/2020 Echo: EF 40-45%, glob HK. RVSP 42.58mHg. Mildly dil LA. Mild MS/AoV sclerosis.   CKD (chronic kidney disease), stage Brown (HCC)    Coronary artery disease    a. 01/2018 late presenting inferior MI; b. 01/2018 Cath: Severe multivessel dzs-->CABG x 3 (LIMA->LAD, VG->OM2, VG->RPDA @ [facility]).   Diabetes mellitus without complication ([place]    a. Dx ~ 2000.   Heart palpitations    a. Pt reports prior nl echo's and stress tests. Last stress test w/in past 2 yrs - PCP.   High cholesterol    Hypertension    Mitral regurgitation    a. 01/2018 s/p MV repair @ time of CABG.   Myocardial infarction ([facility]    Neuropathy    legs and hands   Postoperative atrial fibrillation    a. 01/2018 @ time of CABG.   PSVT (paroxysmal supraventricular tachycardia)    a. Very symptomatic with multiple ED evaluations.  Has been on amio.   Severe sepsis with acute organ dysfunction ([place] 12/19/2021   Sleep apnea    "mild - has CPAP, doesn't use   Sleep difficulties 09/07/2019   STEM[place]ST elevation [place] infarction) [place] 01/24/[place]   Wears dentures    full upper and lower    Past Surgical History:  Procedure Laterality Date   AMPUTATION TOE Right 08/29/2021   Procedure: AMPUTATION TOE-2nd toe;  Surgeon: CSharlotte Alamo DPM;  Location: [facility] OR[facility];  Service: Podiatry;  Laterality: Right;   AMPUTATION TOE Left 12/22/2021   Procedure: AMPUTATION TOE-2nd Toe Partial;  Surgeon: BCaroline More DPM;  Location: [facility] ORS;  Service: Podiatry;  Laterality: Left;   CARDIAC CATHETERIZATION     CARDIAC VALVE REPLACEMENT     Mitral Valve Repair   COLONOSCOPY  WITH PROPOFOL N/A 06/07/2019   Procedure: COLONOSCOPY WITH PROPOFOL;  Surgeon: TVirgel Manifold MD;  Location: [facility] ENDOS[facility];  Service: Endoscopy;  Laterality: N/A;   COLONOSCOPY WITH PROPOFOL N/A 02/07/2020   Procedure: COLONOSCOPY WITH PROPOFOL;  Surgeon: TVirgel Manifold MD;  Location: [facility] ENDOSCOPY;  Service: Endoscopy;  Laterality: N/A;   COLONOSCOPY WITH PROPOFOL N/A 12/11/2020   Procedure: COLONOSCOPY WITH PROPOFOL;  Surgeon: TVirgel Manifold MD;  Location: [facility] ENDOS[facility];  Service: Endoscopy;  Laterality: N/A;   CORONARY ANGIOPLASTY     CORONARY ARTERY BYPASS GRAFT     x 3   01/2018   ESOPHAGOGASTRODUODENOSCOPY N/A 02/07/2020   Procedure: ESOPHAGOGASTRODUODENOSCOPY (EGD);  Surgeon: TVirgel Manifold MD;  [facility]  Service: Endoscopy;  Laterality: N/A;   INCISION AND DRAINAGE Left 06/05/2022   Procedure: INCISION AND DRAINAGE;  Surgeon: BCaroline More DPM;  Location: [facility];  Service: Podiatry;  Laterality: Left;   LEFT HEART CATH AND CORONARY ANGIOGRAPHY N/A 01/24/2018   Procedure: LEFT HEART CATH AND CORONARY ANGIOGRAPHY;  Surgeon: AWellington Hampshire MD;  [facility];  Service: Cardiovascular;  Laterality: N/A;   LOWER EXTREMITY ANGIOGRAPHY Left 06/08/2022   Procedure: Lower Extremity Angiography;  Surgeon: DAlgernon Huxley MD;  [facility];  Service: Cardiovascular;  Laterality: Left;   SVT ABLATION N/A 09/12/2021   Procedure:  SVT ABLATION;  Surgeon: Vickie Epley, MD;  Location: [place] CV LAB;  Service: Cardiovascular;  Laterality: N/A;   TONSILLECTOMY     TRANSMETATARSAL AMPUTATION Left 08/04/2022   Procedure: TRANSMETATARSAL AMPUTATION;  Surgeon: Sharlotte Alamo, DPM;  Location: [facility] OR[facility];  Service: Podiatry;  Laterality: Left;    Prior to Admission medications   Medication Sig Start Date End Date Taking? Authorizing Provider  amiodarone (PACERONE) 200 MG tablet Take 1 tablet (200 mg total) by mouth daily.  10/05/22  Yes [name], MD  aspirin 81 MG EC tablet Take 81 mg by mouth daily.    Yes [provider]  b complex vitamins tablet Take 1 tablet by mouth daily.    Yes [provider]  clopidogrel (PLAVIX) 75 MG tablet Take 1 tablet (75 mg total) by mouth daily. 06/10/22  Yes Rai, Ripudeep K, MD  Dulaglutide 1.5 MG/0.5ML SOPN Inject 1.5 mg into the skin every Saturday.   Yes [provider]  empagliflozin (JARDIANCE) 25 MG TABS tablet Take 25 mg by mouth daily.   Yes [provider]  gabapentin (NEURONTIN) 100 MG capsule Take 1 capsule by mouth twice daily 06/29/22  Yes Masoud, Viann Shove, MD  insulin degludec (TRESIBA) 200 UNIT/ML FlexTouch Pen 32 Units at bedtime.   Yes [provider]  losartan (COZAAR) 25 MG tablet Take 1 tablet (25 mg total) by mouth daily. HOLD UNTIL FOLLOW-UP WITH YOUR DOCTOR 06/09/22  Yes Rai, Ripudeep K, MD  metFORMIN (GLUCOPHAGE) 500 MG tablet Take 2 tablets (1,000 mg total) by mouth 2 (two) times daily. 10/23/21  Yes Theresia Lo, NP  metolazone (ZAROXOLYN) 2.5 MG tablet Take 1 tablet (2.5 mg total) by mouth daily. HOLD UNTIL FOLLOW-UP WITH YOUR DOCTOR 06/09/22  Yes Rai, Ripudeep K, MD  metoprolol succinate (TOPROL-XL) 50 MG 24 hr tablet TAKE 1 TABLET BY MOUTH DAILY. TAKE WITH OR IMMEDIATELY FOLLOWING A MEAL. 06/29/22  Yes Masoud, Viann Shove, MD  Omega-3 Fatty Acids (FISH OIL) 875 MG CAPS Take 875 mg by mouth 2 (two) times daily.   Yes [provider]  pramipexole (MIRAPEX) 1.5 MG tablet Take 1 tablet (1.5 mg total) by mouth 2 (two) times daily. 05/21/21  Yes Masoud, Viann Shove, MD  rosuvastatin (CRESTOR) 20 MG tablet Take 1 tablet (20 mg total) by mouth daily. 07/23/22 10/21/22 Yes Agbor-Etang, Aaron Edelman, MD  spironolactone (ALDACTONE) 25 MG tablet Take 25 mg by mouth daily.   Yes [provider]  torsemide (DEMADEX) 20 MG tablet Take 2 tablets (40 mg total) by mouth daily. 40 mg daily 10/05/22  Yes Agbor-Etang, Aaron Edelman, MD   ascorbic acid (VITAMIN C) 500 MG tablet Take 1 tablet (500 mg total) by mouth 2 (two) times daily. Patient not taking: Reported on 10/01/2022 06/09/22   Rai, Vernelle Emerald, MD  Fe Fum-Vit C-Vit B12-FA (TRIGELS-F FORTE) CAPS capsule Take 1 capsule by mouth daily after breakfast. Patient not taking: Reported on 10/01/2022 08/12/22   Lorella Nimrod, MD  HYDROcodone-acetaminophen (NORCO/VICODIN) 5-325 MG tablet Take 1-2 tablets by mouth every 4 (four) hours as needed for moderate pain. Patient not taking: Reported on 10/01/2022 08/11/22   Lorella Nimrod, MD  polyethylene glycol (MIRALAX / GLYCOLAX) 17 g packet Take 17 g by mouth daily as needed for moderate constipation. 08/11/22   Lorella Nimrod, MD    Allergies as of 07/16/2022 - Review Complete 06/22/2022  Allergen Reaction Noted   Lipitor [atorvastatin] Palpitations 04/23/2015   Sacubitril-valsartan Other (See Comments) 06/07/2019   Pregabalin Palpitations 10/02/2019  Family History  Problem Relation Age of Onset   Cancer Mother        died @ 1   CAD Father        First MI @ 62. S/p heart transplant. Died in mid-50's of cancer.   Cancer Father    Other Brother        alive and well    Social History   Socioeconomic History   Marital status: Married    Spouse name: Not on file   Number of children: Not on file   Years of education: Not on file   Highest education level: Not on file  Occupational History   Not on file  Tobacco Use   Smoking status: Never   Smokeless tobacco: Never  Vaping Use   Vaping Use: Never used  Substance and Sexual Activity   Alcohol use: Not Currently    Comment: rare beer   Drug use: No   Sexual activity: Not on file  Other Topics Concern   Not on file  Social History Narrative   Lives locally with wife.  Works in [employer].  Does not routinely exercise.   Social Determinants of Health   Financial Resource Strain: Not on file  Food Insecurity: No Food Insecurity (07/31/2022)   Hunger Vital Sign     Worried About Running Out of Food in the Last Year: Never true    Ran Out of Food in the Last Year: Never true  Recent Concern: Food Insecurity - Food Insecurity Present (06/02/2022)   Hunger Vital Sign    Worried About Running Out of Food in the Last Year: Sometimes true    Ran Out of Food in the Last Year: Sometimes true  Transportation Needs: No Transportation Needs (07/31/2022)   PRAPARE - Hydrologist (Medical): No    Lack of Transportation (Non-Medical): No  Physical Activity: Not on file  Stress: Not on file  Social Connections: Not on file  Intimate Partner Violence: Not At Risk (07/31/2022)   Humiliation, Afraid, Rape, and Kick questionnaire    Fear of Current or Ex-Partner: No    Emotionally Abused: No    Physically Abused: No    Sexually Abused: No    Review of Systems: See HPI, otherwise negative ROS  Physical Exam: BP 106/65   Temp (!) 97.2 F (36.2 C) (Tympanic)   Resp 14   Ht 5' 7.99" (1.727 m)   Wt 94.1 kg   SpO2 98%   BMI 31.54 kg/m  General:   Alert, cooperative in NAD Head:  Normocephalic and atraumatic. Respiratory:  Normal work of breathing. Cardiovascular:  RRR  Impression/Plan: Ricardo Brown is here for cataract surgery.  Risks, benefits, limitations, and alternatives regarding cataract surgery have been reviewed with the patient.  Questions have been answered.  All parties agreeable.   Norvel Richards, MD  10/08/2022, 11:40 AM

## 2022-10-08 NOTE — Anesthesia Postprocedure Evaluation (Signed)
Anesthesia Post Note  Patient: Ricardo Brown  Procedure(s) Performed: CATARACT EXTRACTION PHACO AND INTRAOCULAR LENS PLACEMENT (IOC) LEFT DIABETIC OMIDRIA 20.94 01:35.9 (Left: Eye)  Patient location during evaluation: PACU Anesthesia Type: MAC Level of consciousness: awake and alert Pain management: pain level controlled Vital Signs Assessment: post-procedure vital signs reviewed and stable Respiratory status: spontaneous breathing, nonlabored ventilation, respiratory function stable and patient connected to nasal cannula oxygen Cardiovascular status: stable and blood pressure returned to baseline Postop Assessment: no apparent nausea or vomiting Anesthetic complications: no   No notable events documented.   Last Vitals:  Vitals:   10/08/22 1224 10/08/22 1228  BP: 107/61 102/65  Pulse: 66 65  Resp: 18 13  Temp: 36.4 C 36.4 C  SpO2: 100% 100%    Last Pain:  Vitals:   10/08/22 1228  TempSrc:   PainSc: 0-No pain                 Martha Clan

## 2022-10-13 NOTE — Progress Notes (Unsigned)
  Electrophysiology Office Follow up Visit Note:    Date:  10/13/2022   ID:  Ricardo Brown, DOB [birth year], MRN [id number]  PCP:  [name], MD  [facility] Cardiologist:  [name], MD  [facility] Electrophysiologist:  Vickie Epley, MD    Interval History:    Ricardo Brown is a 62 y.o. male who presents for a follow up visit.   I last saw the patient April 08, 2022 for his chronic systolic heart failure.  The patient also has a history of SVT and atrial tachycardia treated with amiodarone.  Since I last saw him he has undergone a transmetatarsal amputation of the left foot.  He saw [facility] in follow-up on September 15, 2022.  At that appointment there was some wound debridement performed.  This was secondary to a diabetic foot infection.  Today he tells me he is doing well.  His heart rhythm has been stable.  He is tolerating the amiodarone.  He is healing well after his amputation and has an appointment soon to get his prosthesis.  He has an appointment later this morning with the podiatrist.  He has been off his Plavix for 2 weeks because he ran out of the prescription.  He is unsure about whether or not he needs to be on this medication.     Past medical, surgical, social and family history were reviewed.  ROS:   Please see the history of present illness.    All other systems reviewed and are negative.  EKGs/Labs/Other Studies Reviewed:    The following studies were reviewed today:  September 30, 2022 echo shows an EF of 45 to 50%.  RV is normal.  Post mitral valve repair.  EKG today shows sinus rhythm with a first-degree AV delay and right bundle branch block   Physical Exam:    VS:  There were no vitals taken for this visit.    Wt Readings from Last 3 Encounters:  10/08/22 207 lb 6.4 oz (94.1 kg)  08/20/22 211 lb (95.7 kg)  08/04/22 208 lb 15.9 oz (94.8 kg)     GEN:  Well nourished, well developed in no acute  distress CARDIAC: RRR, no murmurs, rubs, gallops RESPIRATORY:  Clear to auscultation without rales, wheezing or rhonchi       ASSESSMENT:    1. HFrEF (heart failure with reduced ejection fraction) ([place])   2. SVT (supraventricular tachycardia)   3. PSVT (paroxysmal supraventricular tachycardia)   4. Encounter for long-term (current) use of high-risk medication   5. Primary hypertension    PLAN:    In order of problems listed above:  #Chronic systolic heart failure Follows with Dr. Garen Lah.  NYHA class II.  #SVT #High risk med monitoring-amiodarone  maintaining sinus rhythm.  Good control of his arrhythmias on amiodarone. Plan to repeat CMP, TSH and free T4 today  # Tach peripheral vascular disease Had prior peripheral intervention.  Has been seen in the past by Dr. Tana Coast.  Ran out of Plavix but I suspect he should be on this medication given the history of severe peripheral vascular disease.  I have asked the patient to touch base with the podiatrist later this morning to see if it is okay for him to restart.  Follow-up with APP in 6 months    Signed, Lars Mage, MD, [facility], [facility] 10/13/2022 8:44 AM    Electrophysiology [facility]

## 2022-10-14 ENCOUNTER — Other Ambulatory Visit
Admission: RE | Admit: 2022-10-14 | Discharge: 2022-10-14 | Disposition: A | Discharge status: Home / Self Care | Payer: 59 | Source: Ambulatory Visit | Attending: Cardiology | Admitting: Cardiology

## 2022-10-14 ENCOUNTER — Encounter: Payer: Self-pay | Admitting: Infectious Diseases

## 2022-10-14 ENCOUNTER — Encounter: Payer: Self-pay | Admitting: Cardiology

## 2022-10-14 ENCOUNTER — Ambulatory Visit: Payer: 59 | Attending: Cardiology | Admitting: Cardiology

## 2022-10-14 VITALS — BP 130/68 | HR 61 | Ht 68.0 in | Wt 216.4 lb

## 2022-10-14 DIAGNOSIS — I10 Essential (primary) hypertension: Secondary | ICD-10-CM

## 2022-10-14 DIAGNOSIS — Z79899 Other long term (current) drug therapy: Secondary | ICD-10-CM

## 2022-10-14 DIAGNOSIS — I471 Supraventricular tachycardia, unspecified: Secondary | ICD-10-CM

## 2022-10-14 DIAGNOSIS — I5020 Unspecified systolic (congestive) heart failure: Secondary | ICD-10-CM

## 2022-10-14 LAB — COMPREHENSIVE METABOLIC PANEL
ALT: 20 U/L (ref 0–44)
AST: 19 U/L (ref 15–41)
Albumin: 4.2 g/dL (ref 3.5–5.0)
Alkaline Phosphatase: 65 U/L (ref 38–126)
Anion gap: 9 (ref 5–15)
BUN: 54 mg/dL — ABNORMAL HIGH (ref 8–23)
CO2: 21 mmol/L — ABNORMAL LOW (ref 22–32)
Calcium: 8.9 mg/dL (ref 8.9–10.3)
Chloride: 104 mmol/L (ref 98–111)
Creatinine, Ser: 2.11 mg/dL — ABNORMAL HIGH (ref 0.61–1.24)
GFR, Estimated: 35 mL/min — ABNORMAL LOW (ref >60)
Glucose, Bld: 141 mg/dL — ABNORMAL HIGH (ref 70–99)
Potassium: 4.4 mmol/L (ref 3.5–5.1)
Sodium: 134 mmol/L — ABNORMAL LOW (ref 135–145)
Total Bilirubin: 0.6 mg/dL (ref 0.3–1.2)
Total Protein: 7.7 g/dL (ref 6.5–8.1)

## 2022-10-14 LAB — TSH: TSH: 2.797 u[IU]/mL (ref 0.350–4.500)

## 2022-10-14 LAB — T4, FREE: Free T4: 1.05 ng/dL (ref 0.61–1.12)

## 2022-10-14 NOTE — Patient Instructions (Signed)
Medication Instructions:  Your physician recommends that you continue on your current medications as directed. Please refer to the Current Medication list given to you today.  *If you need a refill on your cardiac medications before your next appointment, please call your pharmacy*  Lab Work: CMET, TSH, T4 You will get your lab work at [facility]) hospital.  Your lab work will be done at the [place] next to the [employer]. These are walk in labs- you will not need an appointment and you do not need to be fasting.    Follow-Up: At [facility], you and your health needs are our priority.  As part of our continuing mission to provide you with exceptional heart care, we have created designated Provider Care Teams.  These Care Teams include your primary Cardiologist (physician) and Advanced Practice Providers (APPs -  Physician Assistants and Nurse Practitioners) who all work together to provide you with the care you need, when you need it.  Your next appointment:   6 month(s)  Provider:   You may see Vickie Epley, MD or one of the following Advanced Practice Providers on your designated Care Team:   Tommye Standard, [place] Beryle Beams" Kin[place], [place], NP

## 2022-10-15 ENCOUNTER — Other Ambulatory Visit: Payer: Self-pay

## 2022-10-15 ENCOUNTER — Telehealth: Payer: Self-pay

## 2022-10-15 NOTE — Progress Notes (Signed)
Discontinued spironolactone per Dr. Garen Lah d/t Cr 2.11. OV with primary Cardiologist on 10/29/22.

## 2022-10-15 NOTE — Telephone Encounter (Signed)
Incoming secure chat:  Precious Gilding, RN --  Hi, this pt had labs drawn by Dr. Quentin Ore yesterday however he is on vacation. Is there anyway you could review his Cr 2.11    10:57 AM You were added by [name], MD.   [name], MD [place], advise patient to hold./stop aldactone, recheck bmp in 10days. thank you   Called patient to review results - No answer. MOV to call back.

## 2022-10-15 NOTE — Telephone Encounter (Signed)
Patient was returning call. Please advise ?

## 2022-10-15 NOTE — Telephone Encounter (Signed)
Spoke with patient.   Reviewed Dr. Garen Lah recommendations as below:  "advise patient to hold./stop aldactone, recheck bmp in 10days. thank you"  Patient verbally understood and will get his blood work before his next appt here.

## 2022-10-29 ENCOUNTER — Other Ambulatory Visit: Payer: Self-pay

## 2022-10-29 ENCOUNTER — Encounter: Payer: Self-pay | Admitting: Cardiology

## 2022-10-29 ENCOUNTER — Other Ambulatory Visit
Admission: RE | Admit: 2022-10-29 | Discharge: 2022-10-29 | Disposition: A | Discharge status: Home / Self Care | Payer: 59 | Attending: Cardiology | Admitting: Cardiology

## 2022-10-29 ENCOUNTER — Ambulatory Visit: Payer: Medicare Other | Attending: Cardiology | Admitting: Cardiology

## 2022-10-29 VITALS — BP 152/90 | HR 77 | Ht 68.0 in | Wt 233.4 lb

## 2022-10-29 DIAGNOSIS — Z951 Presence of aortocoronary bypass graft: Secondary | ICD-10-CM | POA: Diagnosis not present

## 2022-10-29 DIAGNOSIS — N179 Acute kidney failure, unspecified: Secondary | ICD-10-CM

## 2022-10-29 DIAGNOSIS — I5020 Unspecified systolic (congestive) heart failure: Secondary | ICD-10-CM

## 2022-10-29 DIAGNOSIS — I10 Essential (primary) hypertension: Secondary | ICD-10-CM

## 2022-10-29 DIAGNOSIS — I471 Supraventricular tachycardia, unspecified: Secondary | ICD-10-CM | POA: Diagnosis not present

## 2022-10-29 LAB — COMPREHENSIVE METABOLIC PANEL
ALT: 17 U/L (ref 0–44)
AST: 17 U/L (ref 15–41)
Albumin: 3.8 g/dL (ref 3.5–5.0)
Alkaline Phosphatase: 57 U/L (ref 38–126)
Anion gap: 9 (ref 5–15)
BUN: 37 mg/dL — ABNORMAL HIGH (ref 8–23)
CO2: 25 mmol/L (ref 22–32)
Calcium: 8.8 mg/dL — ABNORMAL LOW (ref 8.9–10.3)
Chloride: 105 mmol/L (ref 98–111)
Creatinine, Ser: 1.35 mg/dL — ABNORMAL HIGH (ref 0.61–1.24)
GFR, Estimated: 60 mL/min — ABNORMAL LOW (ref >60)
Glucose, Bld: 156 mg/dL — ABNORMAL HIGH (ref 70–99)
Potassium: 4 mmol/L (ref 3.5–5.1)
Sodium: 139 mmol/L (ref 135–145)
Total Bilirubin: 0.3 mg/dL (ref 0.3–1.2)
Total Protein: 7.1 g/dL (ref 6.5–8.1)

## 2022-10-29 MED ORDER — TORSEMIDE 20 MG PO TABS: 60.0000 mg | ORAL_TABLET | Freq: Every day | ORAL | 0 refills | Status: DC

## 2022-10-29 NOTE — Patient Instructions (Signed)
Medication Instructions:   INCREASE Torsemide - take THREE tablets (60mg ) by mouth daily.   *If you need a refill on your cardiac medications before your next appointment, please call your pharmacy*   Lab Work:  Your physician recommends that you return for lab work in:10 days (on or around April 8th, 2024)  at the medical mall. No appt is needed. Hours are M-F 7AM- 6 PM.  If you have labs (blood work) drawn today and your tests are completely normal, you will receive your results only by: [place] (if you have MyChart) OR A paper copy in the mail If you have any lab test that is abnormal or we need to change your treatment, we will call you to review the results.   Testing/Procedures:  None Ordered   Follow-Up: At [facility], you and your health needs are our priority.  As part of our continuing mission to provide you with exceptional heart care, we have created designated Provider Care Teams.  These Care Teams include your primary Cardiologist (physician) and Advanced Practice Providers (APPs -  Physician Assistants and Nurse Practitioners) who all work together to provide you with the care you need, when you need it.  We recommend signing up for the patient portal called "MyChart".  Sign up information is provided on this After Visit Summary.  MyChart is used to connect with patients for Virtual Visits (Telemedicine).  Patients are able to view lab/test results, encounter notes, upcoming appointments, etc.  Non-urgent messages can be sent to your provider as well.   To learn more about what you can do with MyChart, go to [email or web address].    Your next appointment:   1 month(s)  Provider:   You may see [name], MD or one of the following Advanced Practice Providers on your designated Care Team:   Murray Hodgkins, NP Christell Faith, PA-C Cadence Kathlen Mody, PA-C Gerrie Nordmann, NP

## 2022-10-29 NOTE — Progress Notes (Signed)
Cardiology Office Note:    Date:  10/29/2022   ID:  Ricardo Brown, DOB 10-01-1960, MRN [id number]  PCP:  [name], MD  Cardiologist:  [name], MD  Electrophysiologist:  Vickie Epley, MD   Referring MD: [name], MD   Chief Complaint  Patient presents with   Follow-up    6 month f/u.  SOBr.  Left leg swelling for 1 month and left toe amputation in 08/2022.  C/O of fluid retention.     History of Present Illness:    Ricardo Brown is a 62 y.o. male with a hx of SVT on amio , CAD/CABG x3 in 2019, HFrEF (initial EF 40-45%, last EF 45-50%), hypertension, hyperlipidemia, diabetes, CKD-3 who presents for follow-up.   Recent blood work/kidney function showed worsening numbers.  Aldactone was stopped, BMP obtained this morning showed improvement in creatinine levels.  Since stopping Aldactone, his leg seems to be more swollen.  Compliant with torsemide 40 mg daily.  Denies chest pain.   Prior notes Echo 12/2021 EF 45 to 50% Echo 10/2020 EF 40 to [id number], grade 2 diastolic dysfunction. Patient was seen in the [place] in 06/ 2019 due to nausea, weakness, myalgias.  EKG at the time showed inferior ST elevation with reciprocal changes in the anterior leads and small inferior Q waves.  Patient was deemed late presenting STEMI.   Left heart cath was performed, briefly showed occluded distal RCA, 90% mid LAD, 90% proximal left circumflex.  Due to patient being a diabetic, and left heart cath showing diffuse three-vessel disease, CABG was recommended.  He underwent coronary artery bypass grafting x3 in July 2019 at [facility].  Delene Loll previously tried on patient, he did not tolerate.  He developed feeling of unwell, went into SVT and was taken to the emergency room.  Patient with history of SVT, who was seen in the emergency room and adenosine given x2 and placed on amiodarone  Echo on 09/2019 showed mild to moderately reduced ejection fraction, EF  40 to 45%.  Past Medical History:  Diagnosis Date   Chronic combined systolic (congestive) and diastolic (congestive) heart failure ([place])    a. 01/2018 EF 35-40%; b. 10/2020 Echo: EF 40-45%, glob HK. RVSP 42.72mmHg. Mildly dil LA. Mild MS/AoV sclerosis.   CKD (chronic kidney disease), stage Brown (HCC)    Coronary artery disease    a. 01/2018 late presenting inferior MI; b. 01/2018 Cath: Severe multivessel dzs-->CABG x 3 (LIMA->LAD, VG->OM2, VG->RPDA @ [facility]).   Diabetes mellitus without complication ([place])    a. Dx ~ 2000.   Heart palpitations    a. Pt reports prior nl echo's and stress tests. Last stress test w/in past 2 yrs - PCP.   High cholesterol    Hypertension    Mitral regurgitation    a. 01/2018 s/p MV repair @ time of CABG.   Myocardial infarction [facility])    Neuropathy    legs and hands   Postoperative atrial fibrillation    a. 01/2018 @ time of CABG.   PSVT (paroxysmal supraventricular tachycardia)    a. Very symptomatic with multiple ED evaluations.  Has been on amio.   Severe sepsis with acute organ dysfunction ([place]) 12/19/2021   Sleep apnea    "mild - has CPAP, doesn't use   Sleep difficulties 09/07/2019   STEMI (ST elevation myocardial infarction) ([place]) 01/24/2018   Wears dentures    full upper and lower    Past Surgical History:  Procedure Laterality  Date   AMPUTATION TOE Right 08/29/2021   Procedure: AMPUTATION TOE-2nd toe;  Surgeon: Sharlotte Alamo, DPM;  Location: [facility] ORS;  Service: Podiatry;  Laterality: Right;   AMPUTATION TOE Left 12/22/2021   Procedure: AMPUTATION TOE-2nd Toe Partial;  Surgeon: Caroline More, DPM;  Location: [facility] ORS;  Service: Podiatry;  Laterality: Left;   CARDIAC CATHETERIZATION     CARDIAC VALVE REPLACEMENT     Mitral Valve Repair   CATARACT EXTRACTION W/PHACO Left 10/08/2022   Procedure: CATARACT EXTRACTION PHACO AND INTRAOCULAR LENS PLACEMENT ([place]) LEFT DIABETIC OMIDRIA 20.94 01:35.9;  Surgeon: Norvel Richards, MD;  Location: [place];  Service: Ophthalmology;  Laterality: Left;  Diabetic   COLONOSCOPY WITH PROPOFOL N/A 06/07/2019   Procedure: COLONOSCOPY WITH PROPOFOL;  Surgeon: [name], MD;  Location: [facility] ENDOSCOPY;  Service: Endoscopy;  Laterality: N/A;   COLONOSCOPY WITH PROPOFOL N/A 02/07/2020   Procedure: COLONOSCOPY WITH PROPOFOL;  Surgeon: [name], MD;  Location: [facility] ENDOSCOPY;  Service: Endoscopy;  Laterality: N/A;   COLONOSCOPY WITH PROPOFOL N/A 12/11/2020   Procedure: COLONOSCOPY WITH PROPOFOL;  Surgeon: [name], MD;  Location: [facility] ENDOSCOPY;  Service: Endoscopy;  Laterality: N/A;   CORONARY ANGIOPLASTY     CORONARY ARTERY BYPASS GRAFT     x 3   01/2018   ESOPHAGOGASTRODUODENOSCOPY N/A 02/07/2020   Procedure: ESOPHAGOGASTRODUODENOSCOPY (EGD);  Surgeon: [name], MD;  Location: [facility] ENDOSCOPY;  Service: Endoscopy;  Laterality: N/A;   INCISION AND DRAINAGE Left 06/05/2022   Procedure: INCISION AND DRAINAGE;  Surgeon: Caroline More, DPM;  Location: [facility] ORS;  Service: Podiatry;  Laterality: Left;   LEFT HEART CATH AND CORONARY ANGIOGRAPHY N/A 01/24/2018   Procedure: LEFT HEART CATH AND CORONARY ANGIOGRAPHY;  Surgeon: [name], MD;  Location: [place] CV LAB;  Service: Cardiovascular;  Laterality: N/A;   LOWER EXTREMITY ANGIOGRAPHY Left 06/08/2022   Procedure: Lower Extremity Angiography;  Surgeon: Algernon Huxley, MD;  Location: [place] CV LAB;  Service: Cardiovascular;  Laterality: Left;   SVT ABLATION N/A 09/12/2021   Procedure: SVT ABLATION;  Surgeon: Vickie Epley, MD;  Location: [place] CV LAB;  Service: Cardiovascular;  Laterality: N/A;   TONSILLECTOMY     TRANSMETATARSAL AMPUTATION Left 08/04/2022   Procedure: TRANSMETATARSAL AMPUTATION;  Surgeon: Sharlotte Alamo, DPM;  Location: [facility] ORS;  Service: Podiatry;  Laterality: Left;    Current Medications: Current Meds  Medication Sig   amiodarone (PACERONE) 200 MG tablet Take 1 tablet (200  mg total) by mouth daily.   aspirin 81 MG EC tablet Take 81 mg by mouth daily.    b complex vitamins tablet Take 1 tablet by mouth daily.    gabapentin (NEURONTIN) 100 MG capsule Take 1 capsule by mouth twice daily   insulin degludec (TRESIBA) 200 UNIT/ML FlexTouch Pen 32 Units at bedtime.   losartan (COZAAR) 25 MG tablet Take 1 tablet (25 mg total) by mouth daily. HOLD UNTIL FOLLOW-UP WITH YOUR DOCTOR   metFORMIN (GLUCOPHAGE) 500 MG tablet Take 2 tablets (1,000 mg total) by mouth 2 (two) times daily.   metoprolol succinate (TOPROL-XL) 50 MG 24 hr tablet TAKE 1 TABLET BY MOUTH DAILY. TAKE WITH OR IMMEDIATELY FOLLOWING A MEAL.   Omega-3 Fatty Acids (FISH OIL) 875 MG CAPS Take 875 mg by mouth 2 (two) times daily.   pramipexole (MIRAPEX) 1.5 MG tablet Take 1 tablet (1.5 mg total) by mouth 2 (two) times daily.   rosuvastatin (CRESTOR) 20 MG tablet Take 1 tablet (  20 mg total) by mouth daily.   [DISCONTINUED] torsemide (DEMADEX) 20 MG tablet Take 2 tablets (40 mg total) by mouth daily. 40 mg daily     Allergies:   Lipitor [atorvastatin], Sacubitril-valsartan, and Pregabalin   Social History   Socioeconomic History   Marital status: Married    Spouse name: Not on file   Number of children: Not on file   Years of education: Not on file   Highest education level: Not on file  Occupational History   Not on file  Tobacco Use   Smoking status: Never   Smokeless tobacco: Never  Vaping Use   Vaping Use: Never used  Substance and Sexual Activity   Alcohol use: Not Currently    Comment: rare beer   Drug use: No   Sexual activity: Not on file  Other Topics Concern   Not on file  Social History Narrative   Lives locally with wife.  Works in [employer].  Does not routinely exercise.   Social Determinants of Health   Financial Resource Strain: Not on file  Food Insecurity: No Food Insecurity (07/31/2022)   Hunger Vital Sign    Worried About Running Out of Food in the Last Year: Never true     Ran Out of Food in the Last Year: Never true  Recent Concern: Food Insecurity - Food Insecurity Present (06/02/2022)   Hunger Vital Sign    Worried About Running Out of Food in the Last Year: Sometimes true    Ran Out of Food in the Last Year: Sometimes true  Transportation Needs: No Transportation Needs (07/31/2022)   PRAPARE - Hydrologist (Medical): No    Lack of Transportation (Non-Medical): No  Physical Activity: Not on file  Stress: Not on file  Social Connections: Not on file     Family History: The patient's family history includes CAD in his father; Cancer in his father and mother; Other in his brother.  ROS:   Please see the history of present illness.     All other systems reviewed and are negative.  EKGs/Labs/Other Studies Reviewed:    The following studies were reviewed today:   EKG:  EKG is ordered today.  EKG shows normal sinus rhythm, right bundle branch block.  Recent Labs: 05/27/2022: Magnesium 2.2 08/05/2022: B Natriuretic Peptide 385.3 08/10/2022: Hemoglobin 8.5; Platelets 253 10/14/2022: TSH 2.797 10/29/2022: ALT 17; BUN 37; Creatinine, Ser 1.35; Potassium 4.0; Sodium 139  Recent Lipid Panel    Component Value Date/Time   CHOL 122 09/30/2020 1114   TRIG 121 09/30/2020 1114   HDL 35 (L) 09/30/2020 1114   CHOLHDL 3.5 09/30/2020 1114   VLDL 24 01/24/2018 1459   LDLCALC 67 09/30/2020 1114    Physical Exam:    VS:  BP (!) 152/90 (BP Location: Left Arm, Patient Position: Sitting, Cuff Size: Normal)   Pulse 77   Ht 5\' 8"  (1.727 m)   Wt 233 lb 6.4 oz (105.9 kg)   SpO2 99%   BMI 35.49 kg/m     Wt Readings from Last 3 Encounters:  10/29/22 233 lb 6.4 oz (105.9 kg)  10/14/22 216 lb 6.4 oz (98.2 kg)  10/08/22 207 lb 6.4 oz (94.1 kg)     GEN:  Well nourished, well developed in no acute distress HEENT: Normal NECK: No JVD; No carotid bruits CARDIAC: RRR, no murmurs, rubs, gallops RESPIRATORY:  Clear to auscultation  without rales, wheezing or rhonchi  ABDOMEN: Soft, non-tender, non-distended  MUSCULOSKELETAL:  1+ edema; No deformity  SKIN: Warm and dry NEUROLOGIC:  Alert and oriented x 3 PSYCHIATRIC:  Normal affect   ASSESSMENT:    1. Hx of CABG   2. HFrEF (heart failure with reduced ejection fraction) ([place])   3. Primary hypertension   4. SVT (supraventricular tachycardia)    PLAN:    In order of problems listed above:  CAD s/p CABG x3 in 2019. Denies chest pain.  Continue aspirin and Crestor 20 mg daily. ICM, echo 2/24 EF 45 - 50%, unchanged from prior. describes NYHA class II-Brown symptoms.  Has leg edema.  Increase torsemide to 60 mg daily.  Check BMP in 10 days.  Continue losartan 25 mg daily, Toprol-XL 50 mg daily, Jard[place]. Did not tolerate Entresto in the past, went into SVT.  Holding Aldactone due to renal dysfunction. Hypertension, BP elevated.  Increase torsemide as above.  Continue Toprol-XL, losartan.  Consider titrating losartan if BP stays elevated. SVT, currently in sinus rhythm on amiodarone.  Continue amiodarone 200 mg daily.  Follow-up in 6 weeks.  Total encounter time 40 minutes  Greater than 50% was spent in counseling and coordination of care with the patient   Medication Adjustments/Labs and Tests Ordered: Current medicines are reviewed at length with the patient today.  Concerns regarding medicines are outlined above.  Orders Placed This Encounter  Procedures   Basic Metabolic Panel (BMET)   EKG 12-Lead     Meds ordered this encounter  Medications   torsemide (DEMADEX) 20 MG tablet    Sig: Take 3 tablets (60 mg total) by mouth daily. 40 mg daily    Dispense:  270 tablet    Refill:  0      Patient Instructions  Medication Instructions:   INCREASE Torsemide - take THREE tablets (60mg ) by mouth daily.   *If you need a refill on your cardiac medications before your next appointment, please call your pharmacy*   Lab Work:  Your physician recommends that  you return for lab work in:10 days (on or around April 8th, 2024)  at the medical mall. No appt is needed. Hours are M-F 7AM- 6 PM.  If you have labs (blood work) drawn today and your tests are completely normal, you will receive your results only by: [place] (if you have MyChart) OR A paper copy in the mail If you have any lab test that is abnormal or we need to change your treatment, we will call you to review the results.   Testing/Procedures:  None Ordered   Follow-Up: At [facility], you and your health needs are our priority.  As part of our continuing mission to provide you with exceptional heart care, we have created designated Provider Care Teams.  These Care Teams include your primary Cardiologist (physician) and Advanced Practice Providers (APPs -  Physician Assistants and Nurse Practitioners) who all work together to provide you with the care you need, when you need it.  We recommend signing up for the patient portal called "MyChart".  Sign up information is provided on this After Visit Summary.  MyChart is used to connect with patients for Virtual Visits (Telemedicine).  Patients are able to view lab/test results, encounter notes, upcoming appointments, etc.  Non-urgent messages can be sent to your provider as well.   To learn more about what you can do with MyChart, go to [email or web address].    Your next appointment:   1 month(s)  Provider:   You may see [name],  MD or one of the following Advanced Practice Providers on your designated Care Team:   Murray Hodgkins, NP Christell Faith, PA-C Cadence Kathlen Mody, PA-C Gerrie Nordmann, NP   Signed, [name], MD  10/29/2022 10:21 AM    [place]

## 2022-11-04 ENCOUNTER — Other Ambulatory Visit: Payer: Self-pay | Admitting: *Deleted

## 2022-11-04 ENCOUNTER — Telehealth: Payer: Self-pay | Admitting: Cardiology

## 2022-11-04 MED ORDER — ROSUVASTATIN CALCIUM 20 MG PO TABS: 20.0000 mg | ORAL_TABLET | Freq: Every day | ORAL | 0 refills | Status: DC

## 2022-11-04 NOTE — Telephone Encounter (Signed)
90 day supply sent to [facility].

## 2022-11-04 NOTE — Telephone Encounter (Signed)
*  STAT* If patient is at the pharmacy, call can be transferred to refill team.   1. Which medications need to be refilled? (please list name of each medication and dose if known) rosuvastatin (CRESTOR) 20 MG tablet (Expired)   2. Which pharmacy/location (including street and city if local pharmacy) is medication to be sent to? [place], [place]   3. Do they need a 30 day or 90 day supply? [place]

## 2022-11-09 ENCOUNTER — Other Ambulatory Visit
Admission: RE | Admit: 2022-11-09 | Discharge: 2022-11-09 | Disposition: A | Discharge status: Home / Self Care | Payer: 59 | Attending: Cardiology | Admitting: Cardiology

## 2022-11-09 ENCOUNTER — Other Ambulatory Visit: Payer: Self-pay

## 2022-11-09 DIAGNOSIS — Z951 Presence of aortocoronary bypass graft: Secondary | ICD-10-CM | POA: Diagnosis present

## 2022-11-09 LAB — BASIC METABOLIC PANEL
Anion gap: 9 (ref 5–15)
BUN: 35 mg/dL — ABNORMAL HIGH (ref 8–23)
CO2: 26 mmol/L (ref 22–32)
Calcium: 8.6 mg/dL — ABNORMAL LOW (ref 8.9–10.3)
Chloride: 104 mmol/L (ref 98–111)
Creatinine, Ser: 1.4 mg/dL — ABNORMAL HIGH (ref 0.61–1.24)
GFR, Estimated: 57 mL/min — ABNORMAL LOW (ref >60)
Glucose, Bld: 146 mg/dL — ABNORMAL HIGH (ref 70–99)
Potassium: 3.9 mmol/L (ref 3.5–5.1)
Sodium: 139 mmol/L (ref 135–145)

## 2022-11-09 MED ORDER — AMIODARONE HCL 200 MG PO TABS: 200.0000 mg | ORAL_TABLET | Freq: Every day | ORAL | 0 refills | Status: DC

## 2022-11-10 ENCOUNTER — Other Ambulatory Visit (INDEPENDENT_AMBULATORY_CARE_PROVIDER_SITE_OTHER): Payer: Self-pay | Admitting: Nurse Practitioner

## 2022-11-10 DIAGNOSIS — I739 Peripheral vascular disease, unspecified: Secondary | ICD-10-CM

## 2022-11-16 ENCOUNTER — Telehealth: Payer: Self-pay | Admitting: Cardiology

## 2022-11-16 MED ORDER — AMIODARONE HCL 200 MG PO TABS: 200.0000 mg | ORAL_TABLET | Freq: Every day | ORAL | 0 refills | Status: DC

## 2022-11-16 NOTE — Telephone Encounter (Signed)
*  STAT* If patient is at the pharmacy, call can be transferred to refill team.   1. Which medications need to be refilled? (please list name of each medication and dose if known)   amiodarone (PACERONE) 200 MG tablet   2. Which pharmacy/location (including street and city if local pharmacy) is medication to be sent to?  [facility] [address], [place] - [address]   3. Do they need a 30 day or 90 day supply?   90 day  Patient stated he is completely out of this medication.

## 2022-11-16 NOTE — Telephone Encounter (Signed)
Requested Prescriptions   Signed Prescriptions Disp Refills   amiodarone (PACERONE) 200 MG tablet 90 tablet 0    Sig: Take 1 tablet (200 mg total) by mouth daily.    Authorizing Provider: AGBOR-ETANG, BRIAN    Ordering User: NEWCOMER MCCLAIN, Skyann Ganim L    

## 2022-11-19 ENCOUNTER — Ambulatory Visit (INDEPENDENT_AMBULATORY_CARE_PROVIDER_SITE_OTHER): Payer: 59

## 2022-11-19 ENCOUNTER — Ambulatory Visit (INDEPENDENT_AMBULATORY_CARE_PROVIDER_SITE_OTHER): Payer: Medicare Other | Admitting: Nurse Practitioner

## 2022-11-19 DIAGNOSIS — Z9889 Other specified postprocedural states: Secondary | ICD-10-CM | POA: Diagnosis not present

## 2022-11-19 DIAGNOSIS — I739 Peripheral vascular disease, unspecified: Secondary | ICD-10-CM | POA: Diagnosis not present

## 2022-11-23 ENCOUNTER — Ambulatory Visit (INDEPENDENT_AMBULATORY_CARE_PROVIDER_SITE_OTHER): Payer: 59 | Admitting: Nurse Practitioner

## 2022-11-23 ENCOUNTER — Encounter (INDEPENDENT_AMBULATORY_CARE_PROVIDER_SITE_OTHER): Payer: Self-pay | Admitting: Nurse Practitioner

## 2022-11-23 VITALS — BP 152/80 | HR 73 | Resp 16 | Wt 238.6 lb

## 2022-11-23 DIAGNOSIS — I739 Peripheral vascular disease, unspecified: Secondary | ICD-10-CM | POA: Diagnosis not present

## 2022-11-23 DIAGNOSIS — Z9889 Other specified postprocedural states: Secondary | ICD-10-CM | POA: Diagnosis not present

## 2022-11-23 DIAGNOSIS — I10 Essential (primary) hypertension: Secondary | ICD-10-CM

## 2022-11-23 DIAGNOSIS — M7989 Other specified soft tissue disorders: Secondary | ICD-10-CM

## 2022-11-23 LAB — VAS US ABI WITH/WO TBI
Left ABI: 1.25
Right ABI: 1.28

## 2022-11-23 MED ORDER — CLOPIDOGREL BISULFATE 75 MG PO TABS: 75.0000 mg | ORAL_TABLET | Freq: Every day | ORAL | 6 refills | Status: DC

## 2022-11-24 ENCOUNTER — Encounter (INDEPENDENT_AMBULATORY_CARE_PROVIDER_SITE_OTHER): Payer: Self-pay | Admitting: Nurse Practitioner

## 2022-11-24 NOTE — Progress Notes (Signed)
Subjective:    Patient ID: Ricardo Brown, male    DOB: 04-20-1961, 62 y.o.   MRN: [id number] Chief Complaint  Patient presents with   Follow-up    Ref Alberteen Spindle consult/abi.    The patient returns to the office for followup and review status post angiogram with intervention on 06/08/2022.   Procedure: Procedure(s) Performed:             1.  Ultrasound guidance for vascular access right femoral artery             2.  Catheter placement into left SFA from right femoral approach             3.  Aortogram and selective left lower extremity angiogram             4.  Percutaneous transluminal angioplasty of left posterior tibial artery and tibioperoneal trunk with 3 mm diameter angioplasty balloon             5.  Percutaneous transluminal angioplasty of the left tibioperoneal trunk with 4 mm diam Lutonix drug-coated angioplasty             6.  Percutaneous transluminal angioplasty of the left anterior tibial artery with 3 mm angioplasty balloon             7.  StarClose closure device right femoral artery   The patient notes improvement in the lower extremity symptoms. No interval shortening of the patient's claudication distance or rest pain symptoms.  The patient has had TMA on the left foot since his angiogram.  The patient also has concerns of swelling in his left lower extremity.  The swelling has been going on for several months.  He notes that he has worn compression socks but recently they have not been effective.  He notes that he has been having some extensive swelling due to changes in his diuretic medications.  He is currently working with his cardiologist.  Today he does have significant swelling extending from his legs up through his abdomen.  Currently there is no open wounds or weeping.  No documented history of amaurosis fugax or recent TIA symptoms. There are no recent neurological changes noted. No documented history of DVT, PE or superficial thrombophlebitis. The  patient denies recent episodes of angina or shortness of breath.   ABI's Rt=1.28 and Lt=1.25  (previous ABI's Rt=1.17 and Lt=1.18) Duplex US of the the patient has tried phasic tibial artery waveforms on the left with biphasic/triphasic on the right    Review of Systems  Cardiovascular:  Positive for leg swelling.  All other systems reviewed and are negative.      Objective:   Physical Exam Vitals reviewed.  HENT:     Head: Normocephalic.  Cardiovascular:     Rate and Rhythm: Normal rate.  Pulmonary:     Effort: Pulmonary effort is normal.  Abdominal:     General: There is distension.  Musculoskeletal:     Right lower leg: 3+ Pitting Edema present.     Left lower leg: 3+ Pitting Edema present.  Skin:    General: Skin is warm and dry.  Neurological:     Mental Status: He is alert and oriented to person, place, and time.  Psychiatric:        Mood and Affect: Mood normal.        Behavior: Behavior normal.        Thought Content: Thought content normal.  Judgment: Judgment normal.     BP (!) 152/80 (BP Location: Left Arm)   Pulse 73   Resp 16   Wt 238 lb 9.6 oz (108.2 kg)   BMI 36.28 kg/m   Past Medical History:  Diagnosis Date   Chronic combined systolic (congestive) and diastolic (congestive) heart failure    a. 01/2018 EF 35-40%; b. 10/2020 Echo: EF 40-45%, glob HK. RVSP 42.67mmHg. Mildly dil LA. Mild MS/AoV sclerosis.   CKD (chronic kidney disease), stage Brown    Coronary artery disease    a. 01/2018 late presenting inferior MI; b. 01/2018 Cath: Severe multivessel dzs-->CABG x 3 (LIMA->LAD, VG->OM2, VG->RPDA @ [facility]).   Diabetes mellitus without complication    a. Dx ~ 2000.   Heart palpitations    a. Pt reports prior nl echo's and stress tests. Last stress test w/in past 2 yrs - PCP.   High cholesterol    Hypertension    Mitral regurgitation    a. 01/2018 s/p MV repair @ time of CABG.   Myocardial infarction    Neuropathy    legs and hands    Postoperative atrial fibrillation    a. 01/2018 @ time of CABG.   PSVT (paroxysmal supraventricular tachycardia)    a. Very symptomatic with multiple ED evaluations.  Has been on amio.   Severe sepsis with acute organ dysfunction 12/19/2021   Sleep apnea    "mild - has CPAP, doesn't use   Sleep difficulties 09/07/2019   STEMI (ST elevation myocardial infarction) 01/24/2018   Wears dentures    full upper and lower    Social History   Socioeconomic History   Marital status: Married    Spouse name: Not on file   Number of children: Not on file   Years of education: Not on file   Highest education level: Not on file  Occupational History   Not on file  Tobacco Use   Smoking status: Never   Smokeless tobacco: Never  Vaping Use   Vaping Use: Never used  Substance and Sexual Activity   Alcohol use: Not Currently    Comment: rare beer   Drug use: No   Sexual activity: Not on file  Other Topics Concern   Not on file  Social History Narrative   Lives locally with wife.  Works in [employer].  Does not routinely exercise.   Social Determinants of Health   Financial Resource Strain: Not on file  Food Insecurity: No Food Insecurity (07/31/2022)   Hunger Vital Sign    Worried About Running Out of Food in the Last Year: Never true    Ran Out of Food in the Last Year: Never true  Recent Concern: Food Insecurity - Food Insecurity Present (06/02/2022)   Hunger Vital Sign    Worried About Running Out of Food in the Last Year: Sometimes true    Ran Out of Food in the Last Year: Sometimes true  Transportation Needs: No Transportation Needs (07/31/2022)   PRAPARE - Administrator, Civil Service (Medical): No    Lack of Transportation (Non-Medical): No  Physical Activity: Not on file  Stress: Not on file  Social Connections: Not on file  Intimate Partner Violence: Not At Risk (07/31/2022)   Humiliation, Afraid, Rape, and Kick questionnaire    Fear of Current or  Ex-Partner: No    Emotionally Abused: No    Physically Abused: No    Sexually Abused: No    Past Surgical History:  Procedure  Laterality Date   AMPUTATION TOE Right 08/29/2021   Procedure: AMPUTATION TOE-2nd toe;  Surgeon: Linus Galas, DPM;  Location: [facility];  Service: Podiatry;  Laterality: Right;   AMPUTATION TOE Left 12/22/2021   Procedure: AMPUTATION TOE-2nd Toe Partial;  Surgeon: Rosetta Posner, DPM;  Location: [facility];  Service: Podiatry;  Laterality: Left;   CARDIAC CATHETERIZATION     CARDIAC VALVE REPLACEMENT     Mitral Valve Repair   CATARACT EXTRACTION W/PHACO Left 10/08/2022   Procedure: CATARACT EXTRACTION PHACO AND INTRAOCULAR LENS PLACEMENT (IOC) LEFT DIABETIC OMIDRIA 20.94 01:35.9;  Surgeon: Estanislado Pandy, MD;  Location: [facility];  Service: Ophthalmology;  Laterality: Left;  Diabetic   COLONOSCOPY WITH PROPOFOL N/A 06/07/2019   Procedure: COLONOSCOPY WITH PROPOFOL;  Surgeon: Pasty Spillers, MD;  Location: [facility];  Service: Endoscopy;  Laterality: N/A;   COLONOSCOPY WITH PROPOFOL N/A 02/07/2020   Procedure: COLONOSCOPY WITH PROPOFOL;  Surgeon: Pasty Spillers, MD;  Location: [facility];  Service: Endoscopy;  Laterality: N/A;   COLONOSCOPY WITH PROPOFOL N/A 12/11/2020   Procedure: COLONOSCOPY WITH PROPOFOL;  Surgeon: Pasty Spillers, MD;  Location: [facility];  Service: Endoscopy;  Laterality: N/A;   CORONARY ANGIOPLASTY     CORONARY ARTERY BYPASS GRAFT     x 3   01/2018   ESOPHAGOGASTRODUODENOSCOPY N/A 02/07/2020   Procedure: ESOPHAGOGASTRODUODENOSCOPY (EGD);  Surgeon: Pasty Spillers, MD;  Location: [facility];  Service: Endoscopy;  Laterality: N/A;   INCISION AND DRAINAGE Left 06/05/2022   Procedure: INCISION AND DRAINAGE;  Surgeon: Rosetta Posner, DPM;  Location: [facility];  Service: Podiatry;  Laterality: Left;   LEFT HEART CATH AND CORONARY ANGIOGRAPHY N/A 01/24/2018   Procedure: LEFT HEART CATH AND CORONARY  ANGIOGRAPHY;  Surgeon: Iran Ouch, MD;  Location: [facility];  Service: Cardiovascular;  Laterality: N/A;   LOWER EXTREMITY ANGIOGRAPHY Left 06/08/2022   Procedure: Lower Extremity Angiography;  Surgeon: Annice Needy, MD;  Location: [facility];  Service: Cardiovascular;  Laterality: Left;   SVT ABLATION N/A 09/12/2021   Procedure: SVT ABLATION;  Surgeon: Lanier Prude, MD;  Location: [facility] CV [facility];  Service: Cardiovascular;  Laterality: N/A;   TONSILLECTOMY     TRANSMETATARSAL AMPUTATION Left 08/04/2022   Procedure: TRANSMETATARSAL AMPUTATION;  Surgeon: Linus Galas, DPM;  Location: [facility];  Service: Podiatry;  Laterality: Left;    Family History  Problem Relation Age of Onset   Cancer Mother        died @ 37   CAD Father        First MI @ 88. S/p heart transplant. Died in mid-50's of cancer.   Cancer Father    Other Brother        alive and well    Allergies  Allergen Reactions   Lipitor [Atorvastatin] Palpitations    SVT   Sacubitril-Valsartan Other (See Comments)    SVT   Pregabalin Palpitations    SVT       Latest Ref Rng & Units 08/10/2022    2:52 AM 08/07/2022    3:53 AM 08/06/2022    1:15 AM  CBC  WBC 4.0 - 10.5 K/uL 6.5  8.8  9.6   Hemoglobin 13.0 - 17.0 g/dL 8.5  8.6  8.6   Hematocrit 39.0 - 52.0 % 27.0  27.0  27.6   Platelets 150 - 400 K/uL 253  222  236       CMP     Component Value  Date/Time   NA 139 11/09/2022 1040   NA 136 08/26/2021 0952   K 3.9 11/09/2022 1040   CL 104 11/09/2022 1040   CO2 26 11/09/2022 1040   GLUCOSE 146 (H) 11/09/2022 1040   BUN 35 (H) 11/09/2022 1040   BUN 69 (H) 08/26/2021 0952   CREATININE 1.40 (H) 11/09/2022 1040   CREATININE 1.31 09/30/2020 1114   CALCIUM 8.6 (L) 11/09/2022 1040   PROT 7.1 10/29/2022 0851   ALBUMIN 3.8 10/29/2022 0851   AST 17 10/29/2022 0851   ALT 17 10/29/2022 0851   ALKPHOS 57 10/29/2022 0851   BILITOT 0.3 10/29/2022 0851   GFRNONAA 57 (L) 11/09/2022 1040    GFRNONAA 59 (L) 09/30/2020 1114   GFRAA 69 09/30/2020 1114     VAS [place] ABI WITH/WO TBI  Result Date: 11/23/2022  LOWER EXTREMITY DOPPLER STUDY Patient Name:  BOB DAVERSA  Date of Exam:   11/19/2022 Medical Rec #: [id number]            Accession #:    [id number] Date of Birth: 02/19/1961           Patient Gender: M Patient Age:   54 years Exam Location:  [facility] Procedure:      VAS [place] ABI WITH/WO TBI Referring Phys: --------------------------------------------------------------------------------  Indications: Peripheral artery disease. all left toes amputated  Vascular Interventions: Percutaneous transluminal angioplasty of left posterior                         tibial artery and tibioperoneal trunk with 3 mm diameter                         angioplasty balloon                         5. Percutaneous transluminal angioplasty of the left                         tibioperoneal trunk with 4 mm diam Lutonix drug-coated                         angioplasty                         6. Percutaneous transluminal angioplasty of the left                         anterior tibial artery with 3 mm angioplasty balloon. Performing Technologist: Salvadore Farber RVT  Examination Guidelines: A complete evaluation includes at minimum, Doppler waveform signals and systolic blood pressure reading at the level of bilateral brachial, anterior tibial, and posterior tibial arteries, when vessel segments are accessible. Bilateral testing is considered an integral part of a complete examination. Photoelectric Plethysmograph (PPG) waveforms and toe systolic pressure readings are included as required and additional duplex testing as needed. Limited examinations for reoccurring indications may be performed as noted.  ABI Findings: +---------+------------------+-----+---------+--------+ Right    Rt Pressure (mmHg)IndexWaveform Comment  +---------+------------------+-----+---------+--------+ Brachial 133                                       +---------+------------------+-----+---------+--------+ ATA      136  1.02 biphasic          +---------+------------------+-----+---------+--------+ PTA      170               1.28 triphasic         +---------+------------------+-----+---------+--------+ Great Toe93                0.70 Normal            +---------+------------------+-----+---------+--------+ +----+------------------+-----+---------+-------+ LeftLt Pressure (mmHg)IndexWaveform Comment +----+------------------+-----+---------+-------+ ATA 153               1.15 triphasic        +----+------------------+-----+---------+-------+ PTA 166               1.25 triphasic        +----+------------------+-----+---------+-------+ +-------+-----------+-----------+------------+------------+ ABI/TBIToday's ABIToday's TBIPrevious ABIPrevious TBI +-------+-----------+-----------+------------+------------+ Right  1.28       .70        1.27        .50          +-------+-----------+-----------+------------+------------+ Left   1.25       amp        1.18        .64          +-------+-----------+-----------+------------+------------+  Right TBIs appear increased.  Summary: Right: Resting right ankle-brachial index is within normal range. The right toe-brachial index is normal. Left: Resting left ankle-brachial index is within normal range. *See table(s) above for measurements and observations.  Electronically signed by Festus Barren MD on 11/23/2022 at 7:25:33 AM.    Final        Assessment & Plan:   1. Peripheral arterial disease with history of revascularization  Recommend:  The patient has evidence of atherosclerosis of the lower extremities with no claudication.  The patient does not voice lifestyle limiting changes at this point in time.  Noninvasive studies do not suggest clinically significant change.  No invasive studies, angiography or surgery at this time The  patient should continue walking and begin a more formal exercise program.  The patient should continue antiplatelet therapy and aggressive treatment of the lipid abnormalities  No changes in the patient's medications at this time  Continued surveillance is indicated as atherosclerosis is likely to progress with time.    The patient will continue follow up with noninvasive studies as ordered.  The patient follow-up in 6 months 2. Leg swelling The patient notes that he has been having extensive swelling in his left lower extremity however currently the patient seems to be volume overloaded.  He has swelling up to his abdomen.  I discussed with the patient the systemic causes of swelling such as heart failure and kidney disease of which she has both.  We also discussed localized cause of swelling such as venous insufficiency.  The patient is advised that once he is able to get his volume status under control we can have him return for venous reflux studies if he continues to have issues with lower extremity edema.  We also discussed conservative therapy including use of medical grade compression, elevation and activity.  3. Primary hypertension Continue antihypertensive medications as already ordered, these medications have been reviewed and there are no changes at this time.   Current Outpatient Medications on File Prior to Visit  Medication Sig Dispense Refill   amiodarone (PACERONE) 200 MG tablet Take 1 tablet (200 mg total) by mouth daily. 90 tablet 0   aspirin 81 MG EC tablet Take 81 mg by mouth daily.  b complex vitamins tablet Take 1 tablet by mouth daily.      gabapentin (NEURONTIN) 100 MG capsule Take 1 capsule by mouth twice daily 60 capsule 0   insulin degludec (TRESIBA) 200 UNIT/ML FlexTouch Pen 32 Units at bedtime.     losartan (COZAAR) 25 MG tablet Take 1 tablet (25 mg total) by mouth daily. HOLD UNTIL FOLLOW-UP WITH YOUR DOCTOR     metFORMIN (GLUCOPHAGE) 500 MG tablet Take 2  tablets (1,000 mg total) by mouth 2 (two) times daily. 360 tablet 2   metoprolol succinate (TOPROL-XL) 50 MG 24 hr tablet TAKE 1 TABLET BY MOUTH DAILY. TAKE WITH OR IMMEDIATELY FOLLOWING A MEAL. 90 tablet 0   Omega-3 Fatty Acids (FISH OIL) 875 MG CAPS Take 875 mg by mouth 2 (two) times daily.     pramipexole (MIRAPEX) 1.5 MG tablet Take 1 tablet (1.5 mg total) by mouth 2 (two) times daily. 180 tablet 3   rosuvastatin (CRESTOR) 20 MG tablet Take 1 tablet (20 mg total) by mouth daily. 90 tablet 0   torsemide (DEMADEX) 20 MG tablet Take 3 tablets (60 mg total) by mouth daily. 40 mg daily 270 tablet 0   ascorbic acid (VITAMIN C) 500 MG tablet Take 1 tablet (500 mg total) by mouth 2 (two) times daily. (Patient not taking: Reported on 10/29/2022) 60 tablet 3   Dulaglutide 1.5 MG/0.5ML SOPN Inject 1.5 mg into the skin every Saturday. (Patient not taking: Reported on 10/29/2022)     empagliflozin (JARDIANCE) 25 MG TABS tablet Take 25 mg by mouth daily. (Patient not taking: Reported on 10/29/2022)     metolazone (ZAROXOLYN) 2.5 MG tablet Take 1 tablet (2.5 mg total) by mouth daily. HOLD UNTIL FOLLOW-UP WITH YOUR DOCTOR (Patient not taking: Reported on 10/29/2022) 30 tablet 4   No current facility-administered medications on file prior to visit.    There are no Patient Instructions on file for this visit. No follow-ups on file.   Georgiana Spinner, NP

## 2022-12-04 ENCOUNTER — Ambulatory Visit: Payer: 59 | Attending: Cardiology | Admitting: Cardiology

## 2022-12-04 ENCOUNTER — Encounter: Payer: Self-pay | Admitting: Cardiology

## 2022-12-04 VITALS — BP 136/70 | HR 66 | Ht 68.0 in | Wt 235.0 lb

## 2022-12-04 DIAGNOSIS — Z951 Presence of aortocoronary bypass graft: Secondary | ICD-10-CM

## 2022-12-04 DIAGNOSIS — I10 Essential (primary) hypertension: Secondary | ICD-10-CM

## 2022-12-04 DIAGNOSIS — I471 Supraventricular tachycardia, unspecified: Secondary | ICD-10-CM

## 2022-12-04 DIAGNOSIS — I5020 Unspecified systolic (congestive) heart failure: Secondary | ICD-10-CM

## 2022-12-04 MED ORDER — METOLAZONE 5 MG PO TABS: 5.0000 mg | ORAL_TABLET | Freq: Every day | ORAL | 0 refills | Status: DC

## 2022-12-04 NOTE — Progress Notes (Signed)
Cardiology Office Note:    Date:  12/04/2022   ID:  Ricardo Brown, DOB 20-Jan-1961, MRN [id number]  PCP:  Corky Downs, MD  Cardiologist:  Debbe Odea, MD  Electrophysiologist:  Lanier Prude, MD   Referring MD: Corky Downs, MD   Chief Complaint  Patient presents with   Follow-up    1 month follow up. Patient reports increased swelling and SOB. Meds reviewed verbally with patient.      History of Present Illness:    Ricardo Brown is a 62 y.o. male with a hx of SVT on amio , CAD/CABG x3 in 2019, HFrEF (initial EF 40-45%, last EF 45-50%), hypertension, hyperlipidemia, diabetes, CKD-3 who presents for follow-up.   He states having worsening shortness of breath, torsemide was previously increased to 60 mg daily, has not had much benefit from increased dose of torsemide.  Endorses leg edema, abdominal distention, shortness of breath with minimal exertion.  Compliant with medications as prescribed.   Prior notes Echo 12/2021 EF 45 to 50% Echo 10/2020 EF 40 to 45%, grade 2 diastolic dysfunction. Patient was seen in the hospital/[facility] in 06/ 2019 due to nausea, weakness, myalgias.  EKG at the time showed inferior ST elevation with reciprocal changes in the anterior leads and small inferior Q waves.  Patient was deemed late presenting STEMI.   Left heart cath was performed, briefly showed occluded distal RCA, 90% mid LAD, 90% proximal left circumflex.  Due to patient being a diabetic, and left heart cath showing diffuse three-vessel disease, CABG was recommended.  He underwent coronary artery bypass grafting x3 in July 2019 at [facility]. Aldactone caused worsening renal dysfunction, this was stopped.  Sherryll Burger previously tried on patient, he did not tolerate.  He developed feeling of unwell, went into SVT and was taken to the emergency room.  Patient with history of SVT, who was seen in the emergency room and adenosine given x2 and placed on  amiodarone  Echo on 09/2019 showed mild to moderately reduced ejection fraction, EF 40 to 45%.  Past Medical History:  Diagnosis Date   Chronic combined systolic (congestive) and diastolic (congestive) heart failure (HCC)    a. 01/2018 EF 35-40%; b. 10/2020 Echo: EF 40-45%, glob HK. RVSP 42.87mmHg. Mildly dil LA. Mild MS/AoV sclerosis.   CKD (chronic kidney disease), stage Brown (HCC)    Coronary artery disease    a. 01/2018 late presenting inferior MI; b. 01/2018 Cath: Severe multivessel dzs-->CABG x 3 (LIMA->LAD, VG->OM2, VG->RPDA @ [facility]).   Diabetes mellitus without complication (HCC)    a. Dx ~ 2000.   Heart palpitations    a. Pt reports prior nl echo's and stress tests. Last stress test w/in past 2 yrs - PCP.   High cholesterol    Hypertension    Mitral regurgitation    a. 01/2018 s/p MV repair @ time of CABG.   Myocardial infarction [facility])    Neuropathy    legs and hands   Postoperative atrial fibrillation    a. 01/2018 @ time of CABG.   PSVT (paroxysmal supraventricular tachycardia)    a. Very symptomatic with multiple ED evaluations.  Has been on amio.   Severe sepsis with acute organ dysfunction (HCC) 12/19/2021   Sleep apnea    "mild - has CPAP, doesn't use   Sleep difficulties 09/07/2019   STEMI (ST elevation myocardial infarction) (HCC) 01/24/2018   Wears dentures    full upper and lower    Past Surgical History:  Procedure Laterality Date   AMPUTATION TOE Right 08/29/2021   Procedure: AMPUTATION TOE-2nd toe;  Surgeon: Linus Galas, DPM;  Location: [facility] ORS;  Service: Podiatry;  Laterality: Right;   AMPUTATION TOE Left 12/22/2021   Procedure: AMPUTATION TOE-2nd Toe Partial;  Surgeon: Rosetta Posner, DPM;  Location: [facility] ORS;  Service: Podiatry;  Laterality: Left;   CARDIAC CATHETERIZATION     CARDIAC VALVE REPLACEMENT     Mitral Valve Repair   CATARACT EXTRACTION W/PHACO Left 10/08/2022   Procedure: CATARACT EXTRACTION PHACO AND INTRAOCULAR LENS PLACEMENT (IOC) LEFT DIABETIC  OMIDRIA 20.94 01:35.9;  Surgeon: Estanislado Pandy, MD;  Location: [facility];  Service: Ophthalmology;  Laterality: Left;  Diabetic   COLONOSCOPY WITH PROPOFOL N/A 06/07/2019   Procedure: COLONOSCOPY WITH PROPOFOL;  Surgeon: Pasty Spillers, MD;  Location: [facility] ENDOSCOPY;  Service: Endoscopy;  Laterality: N/A;   COLONOSCOPY WITH PROPOFOL N/A 02/07/2020   Procedure: COLONOSCOPY WITH PROPOFOL;  Surgeon: Pasty Spillers, MD;  Location: [facility] ENDOSCOPY;  Service: Endoscopy;  Laterality: N/A;   COLONOSCOPY WITH PROPOFOL N/A 12/11/2020   Procedure: COLONOSCOPY WITH PROPOFOL;  Surgeon: Pasty Spillers, MD;  Location: [facility] ENDOSCOPY;  Service: Endoscopy;  Laterality: N/A;   CORONARY ANGIOPLASTY     CORONARY ARTERY BYPASS GRAFT     x 3   01/2018   ESOPHAGOGASTRODUODENOSCOPY N/A 02/07/2020   Procedure: ESOPHAGOGASTRODUODENOSCOPY (EGD);  Surgeon: Pasty Spillers, MD;  Location: [facility] END[facility]COPY;  Service: Endoscopy;  Laterality: N/A;   INCISION AND DRAINAGE Left 06/05/2022   Procedure: INCISION AND DRAINAGE;  Surgeon: Rosetta Posner, DPM;  Location: [facility] ORS;  Service: Podiatry;  Laterality: Left;   LEFT HEART CATH AND CORONARY ANGIOGRAPHY N/A 01/24/2018   Procedure: LEFT HEART CATH AND CORONARY ANGIOGRAPHY;  Surgeon: Iran Ouch, MD;  Location: [facility] INVASIVE CV LAB;  Service: Cardiovascular;  Laterality: N/A;   LOWER EXTREMITY ANGIOGRAPHY Left 06/08/2022   Procedure: Lower Extremity Angiography;  Surgeon: Annice Needy, MD;  Location: [facility] INVASIVE CV LAB;  Service: Cardiovascular;  Laterality: Left;   SVT ABLATION N/A 09/12/2021   Procedure: SVT ABLATION;  Surgeon: Lanier Prude, MD;  Location[facility] CV LAB;  Service: Cardiovascular;  Laterality: N/A;   TONSILLECTOMY     TRANSMETATARSAL AMPUTATION Left 08/04/2022   Procedure: TRANSMETATARSAL AMPUTATION;  Surgeon: Linus Galas, DPM;  Location: [facility];  Service: Podiatry;  Laterality: Left;    Current Medications: No  outpatient medications have been marked as taking for the 12/04/22 encounter (Office Visit) with Debbe Odea, MD.     Allergies:   Lipitor [atorvastatin], Sacubitril-valsartan, and Pregabalin   Social History   Socioeconomic History   Marital status: Married    Spouse name: Not on file   Number of children: Not on file   Years of education: Not on file   Highest education level: Not on file  Occupational History   Not on file  Tobacco Use   Smoking status: Never   Smokeless tobacco: Never  Vaping Use   Vaping Use: Never used  Substance and Sexual Activity   Alcohol use: Not Currently    Comment: rare beer   Drug use: No   Sexual activity: Not on file  Other Topics Concern   Not on file  Social History Narrative   Lives locally with wife.  Works in [employer].  Does not routinely exercise.   Social Determinants of Health   Financial Resource Strain: Not on file  Food Insecurity: No Food Insecurity (07/31/2022)  Hunger Vital Sign    Worried About Running Out of Food in the Last Year: Never true    Ran Out of Food in the Last Year: Never true  Recent Concern: Food Insecurity - Food Insecurity Present (06/02/2022)   Hunger Vital Sign    Worried About Running Out of Food in the Last Year: Sometimes true    Ran Out of Food in the Last Year: Sometimes true  Transportation Needs: No Transportation Needs (07/31/2022)   PRAPARE - Administrator, Civil Service (Medical): No    Lack of Transportation (Non-Medical): No  Physical Activity: Not on file  Stress: Not on file  Social Connections: Not on file     Family History: The patient's family history includes CAD in his father; Cancer in his father and mother; Other in his brother.  ROS:   Please see the history of present illness.     All other systems reviewed and are negative.  EKGs/Labs/Other Studies Reviewed:    The following studies were reviewed today:   EKG:  EKG not ordered today.    Recent Labs: 05/27/2022: Magnesium 2.2 08/05/2022: B Natriuretic Peptide 385.3 08/10/2022: Hemoglobin 8.5; Platelets 253 10/14/2022: TSH 2.797 10/29/2022: ALT 17 11/09/2022: BUN 35; Creatinine, Ser 1.40; Potassium 3.9; Sodium 139  Recent Lipid Panel    Component Value Date/Time   CHOL 122 09/30/2020 1114   TRIG 121 09/30/2020 1114   HDL 35 (L) 09/30/2020 1114   CHOLHDL 3.5 09/30/2020 1114   VLDL 24 01/24/2018 1459   LDLCALC 67 09/30/2020 1114    Physical Exam:    VS:  BP 136/70 (BP Location: Left Arm, Patient Position: Sitting, Cuff Size: Normal)   Pulse 66   Ht 5\' 8"  (1.727 m)   Wt 235 lb (106.6 kg)   SpO2 97%   BMI 35.73 kg/m     Wt Readings from Last 3 Encounters:  12/04/22 235 lb (106.6 kg)  11/23/22 238 lb 9.6 oz (108.2 kg)  10/29/22 233 lb 6.4 oz (105.9 kg)     GEN:  Well nourished, well developed in no acute distress HEENT: Normal NECK: No JVD; No carotid bruits CARDIAC: RRR, no murmurs, rubs, gallops RESPIRATORY:  Clear to auscultation without rales, wheezing or rhonchi  ABDOMEN: Soft, non-tender, distended MUSCULOSKELETAL:  1-2+ edema; No deformity  SKIN: Warm and dry NEUROLOGIC:  Alert and oriented x 3 PSYCHIATRIC:  Normal affect   ASSESSMENT:    1. Hx of CABG   2. HFrEF (heart failure with reduced ejection fraction) (HCC)   3. Primary hypertension   4. SVT (supraventricular tachycardia)    PLAN:    In order of problems listed above:  CAD s/p CABG x3 in 2019. Denies chest pain.  Continue aspirin and Crestor 20 mg daily. ICM, echo 2/24 EF 45 - 50%, unchanged from prior. describes NYHA class Brown symptoms, leg edema, abdominal distention.  Start metolazone 5 mg daily, continue torsemide 60 mg daily.  Check BMP in 7 days.  Refer to advanced heart failure clinic. continue losartan 25 mg daily, Toprol-XL 50 mg daily, Jardiance. Did not tolerate Entresto in the past, went into SVT.  Holding Aldactone due to worsening renal function with taking  Aldactone. Hypertension, BP controlled.  Continue Toprol-XL, losartan.   SVT, currently in sinus rhythm on amiodarone.  Continue amiodarone 200 mg daily.  Follow-up in 4-6 weeks.  Total encounter time 40 minutes  Greater than 50% was spent in counseling and coordination of care with the patient  Medication Adjustments/Labs and Tests Ordered: Current medicines are reviewed at length with the patient today.  Concerns regarding medicines are outlined above.  Orders Placed This Encounter  Procedures   Basic Metabolic Panel (BMET)   AMB referral to CHF clinic     Meds ordered this encounter  Medications   metolazone (ZAROXOLYN) 5 MG tablet    Sig: Take 1 tablet (5 mg total) by mouth daily.    Dispense:  90 tablet    Refill:  0      Patient Instructions  Medication Instructions:  Your physician has recommended you make the following change in your medication:   START -  metolazone (ZAROXOLYN) 5 MG tablet - Take 1 tablet (5 mg total) by mouth daily  *If you need a refill on your cardiac medications before your next appointment, please call your pharmacy*   Lab Work: Your physician recommends that you return for lab work in 7 days: Press photographer at [facility] 1st desk on the right to check in (REGISTRATION)  Lab hours: Monday- Friday (7:30 am- 5:30 pm)   If you have labs (blood work) drawn today and your tests are completely normal, you will receive your results only by: MyChart Message (if you have MyChart) OR A paper copy in the mail If you have any lab test that is abnormal or we need to change your treatment, we will call you to review the results.   Testing/Procedures: -None   Follow-Up: At [facility], you and your health needs are our priority.  As part of our continuing mission to provide you with exceptional heart care, we have created designated Provider Care Teams.  These Care Teams include your primary Cardiologist (physician) and  Advanced Practice Providers (APPs -  Physician Assistants and Nurse Practitioners) who all work together to provide you with the care you need, when you need it.  We recommend signing up for the patient portal called "MyChart".  Sign up information is provided on this After Visit Summary.  MyChart is used to connect with patients for Virtual Visits (Telemedicine).  Patients are able to view lab/test results, encounter notes, upcoming appointments, etc.  Non-urgent messages can be sent to your provider as well.   To learn more about what you can do with MyChart, go to [email or web address].    Your next appointment:   4 - 6 weeks  Provider:   Debbe Odea, MD    Other Instructions - AMB referral to CHF clinic   Signed, Debbe Odea, MD  12/04/2022 10:07 AM    [facility]

## 2022-12-04 NOTE — Patient Instructions (Addendum)
Medication Instructions:  Your physician has recommended you make the following change in your medication:   START -  metolazone (ZAROXOLYN) 5 MG tablet - Take 1 tablet (5 mg total) by mouth daily  *If you need a refill on your cardiac medications before your next appointment, please call your pharmacy*   Lab Work: Your physician recommends that you return for lab work in 7 days: Press photographer at [facility] 1st desk on the right to check in (REGISTRATION)  Lab hours: Monday- Friday (7:30 am- 5:30 pm)   If you have labs (blood work) drawn today and your tests are completely normal, you will receive your results only by: MyChart Message (if you have MyChart) OR A paper copy in the mail If you have any lab test that is abnormal or we need to change your treatment, we will call you to review the results.   Testing/Procedures: -None   Follow-Up: At [facility], you and your health needs are our priority.  As part of our continuing mission to provide you with exceptional heart care, we have created designated Provider Care Teams.  These Care Teams include your primary Cardiologist (physician) and Advanced Practice Providers (APPs -  Physician Assistants and Nurse Practitioners) who all work together to provide you with the care you need, when you need it.  We recommend signing up for the patient portal called "MyChart".  Sign up information is provided on this After Visit Summary.  MyChart is used to connect with patients for Virtual Visits (Telemedicine).  Patients are able to view lab/test results, encounter notes, upcoming appointments, etc.  Non-urgent messages can be sent to your provider as well.   To learn more about what you can do with MyChart, go to [email or web address].    Your next appointment:   4 - 6 weeks  Provider:   Debbe Odea, MD    Other Instructions - AMB referral to CHF clinic

## 2022-12-09 ENCOUNTER — Other Ambulatory Visit (HOSPITAL_COMMUNITY): Payer: Self-pay

## 2022-12-09 ENCOUNTER — Ambulatory Visit (HOSPITAL_BASED_OUTPATIENT_CLINIC_OR_DEPARTMENT_OTHER): Payer: 59 | Admitting: Cardiology

## 2022-12-09 ENCOUNTER — Telehealth: Payer: Self-pay | Admitting: Cardiology

## 2022-12-09 ENCOUNTER — Other Ambulatory Visit
Admission: RE | Admit: 2022-12-09 | Discharge: 2022-12-09 | Disposition: A | Discharge status: Home / Self Care | Payer: 59 | Source: Ambulatory Visit | Attending: Cardiology | Admitting: Cardiology

## 2022-12-09 VITALS — BP 137/74 | HR 69 | Wt 232.2 lb

## 2022-12-09 DIAGNOSIS — I5042 Chronic combined systolic (congestive) and diastolic (congestive) heart failure: Secondary | ICD-10-CM | POA: Insufficient documentation

## 2022-12-09 DIAGNOSIS — I4719 Other supraventricular tachycardia: Secondary | ICD-10-CM

## 2022-12-09 DIAGNOSIS — I5020 Unspecified systolic (congestive) heart failure: Secondary | ICD-10-CM

## 2022-12-09 DIAGNOSIS — I471 Supraventricular tachycardia, unspecified: Secondary | ICD-10-CM

## 2022-12-09 DIAGNOSIS — Z951 Presence of aortocoronary bypass graft: Secondary | ICD-10-CM

## 2022-12-09 LAB — LIPID PANEL
Cholesterol: 109 mg/dL (ref 0–200)
HDL: 28 mg/dL — ABNORMAL LOW (ref >40)
LDL Cholesterol: 52 mg/dL (ref 0–99)
Total CHOL/HDL Ratio: 3.9 RATIO
Triglycerides: 147 mg/dL (ref <150)
VLDL: 29 mg/dL (ref 0–40)

## 2022-12-09 LAB — COMPREHENSIVE METABOLIC PANEL
ALT: 20 U/L (ref 0–44)
AST: 23 U/L (ref 15–41)
Albumin: 4.2 g/dL (ref 3.5–5.0)
Alkaline Phosphatase: 67 U/L (ref 38–126)
Anion gap: 9 (ref 5–15)
BUN: 41 mg/dL — ABNORMAL HIGH (ref 8–23)
CO2: 27 mmol/L (ref 22–32)
Calcium: 8.7 mg/dL — ABNORMAL LOW (ref 8.9–10.3)
Chloride: 101 mmol/L (ref 98–111)
Creatinine, Ser: 1.74 mg/dL — ABNORMAL HIGH (ref 0.61–1.24)
GFR, Estimated: 44 mL/min — ABNORMAL LOW (ref >60)
Glucose, Bld: 128 mg/dL — ABNORMAL HIGH (ref 70–99)
Potassium: 4 mmol/L (ref 3.5–5.1)
Sodium: 137 mmol/L (ref 135–145)
Total Bilirubin: 0.8 mg/dL (ref 0.3–1.2)
Total Protein: 7.3 g/dL (ref 6.5–8.1)

## 2022-12-09 LAB — TSH: TSH: 4.261 u[IU]/mL (ref 0.350–4.500)

## 2022-12-09 LAB — BRAIN NATRIURETIC PEPTIDE: B Natriuretic Peptide: 229.4 pg/mL — ABNORMAL HIGH (ref 0.0–100.0)

## 2022-12-09 MED ORDER — DAPAGLIFLOZIN PROPANEDIOL 10 MG PO TABS: 10.0000 mg | ORAL_TABLET | Freq: Every day | ORAL | 6 refills | Status: DC

## 2022-12-09 MED ORDER — POTASSIUM CHLORIDE CRYS ER 20 MEQ PO TBCR: 20.0000 meq | EXTENDED_RELEASE_TABLET | Freq: Every day | ORAL | 3 refills | Status: DC

## 2022-12-09 NOTE — Telephone Encounter (Signed)
Please review for refill. Patient seen today by Elly Modena. Thank you!

## 2022-12-09 NOTE — Telephone Encounter (Signed)
*  STAT* If patient is at the pharmacy, call can be transferred to refill team.   1. Which medications need to be refilled? (please list name of each medication and dose if known) torsemide (DEMADEX) 20 MG tablet   2. Which pharmacy/location (including street and city if local pharmacy) is medication to be sent to?  [facility] [address] [place] - [address]    3. Do they need a 30 day or 90 day supply? 90 day

## 2022-12-09 NOTE — Patient Instructions (Signed)
Medication Changes:  Hold Torsemide until Saturday.Hold Metolazone until Saturday.   On Saturday START Torsemide 60 mg (3 tablets) every morning, 40 mg (2 tablets) at night . WITH Metolazone 2.5 mg (0.5 tablet)  two times weekly on Mondays and Fridays (Start Next Monday)   Start Potassium 20 mEq (1 tablet) daily.  Start Farxiga 10 mg (1 tablet ) daily.      Your provider has order Furoscix for you. This is an on-body infuser that gives you a dose of Furosemide.   It will be shipped to your home   [employer] [facility] will call you to discuss before shipping so, PLEASE answer unknown calls  For questions regarding the device call [employer] at [phone]  Ensure you write down the time you start your infusion so that if there is a problem you will know how long the infusion lasted  Use Furoscix only AS DIRECTED by our office   Lab Work:  You will have lab work today and again in 10 days. Please come 5/17 to get lab work completed. Labs done today, your results will be available in MyChart, we will contact you for abnormal readings.    Special Instructions // Education:  Do the following things EVERYDAY: Weigh yourself in the morning before breakfast. Write it down and keep it in a log. Take your medicines as prescribed Eat low salt foods--Limit salt (sodium) to 2000 mg per day.  Stay as active as you can everyday Limit all fluids for the day to less than 2 liters   Follow-Up in: follow up next week.     If you have any questions or concerns before your next appointment please send [place] a message through [place] or call our office at [phone] Monday-Friday 8 am-5 pm.   If you have an urgent need after hours on the weekend please call your Primary Cardiologist or the Advanced Heart Failure Clinic in [place] at [phone].

## 2022-12-09 NOTE — Progress Notes (Signed)
   12/09/22 1042  ReDS Vest / Clip  Station Marker D  Ruler Value 34  ReDS Value Range (!) > 40  ReDS Actual Value 50

## 2022-12-10 ENCOUNTER — Other Ambulatory Visit: Payer: Self-pay

## 2022-12-10 DIAGNOSIS — I5020 Unspecified systolic (congestive) heart failure: Secondary | ICD-10-CM

## 2022-12-10 MED ORDER — TORSEMIDE 20 MG PO TABS
60.0000 mg | ORAL_TABLET | Freq: Every day | ORAL | 3 refills | Status: DC
Start: 2022-12-10 — End: 2022-12-10

## 2022-12-10 MED ORDER — TORSEMIDE 20 MG PO TABS
60.0000 mg | ORAL_TABLET | Freq: Every day | ORAL | 3 refills | Status: DC
Start: 2022-12-10 — End: 2022-12-15

## 2022-12-10 NOTE — Progress Notes (Signed)
PCP: Corky Downs, MD Cardiology: Dr. Azucena Cecil HF Cardiology: Dr. Shirlee Latch  62 y.o. with history of CAD s/p CABG, ischemic cardiomyopathy, and PAD was referred by Dr. Azucena Cecil for evaluation of CHF.  Patient had a delayed presentation inferior MI in 6/19.   Cath showed totally occluded RCA, 90% mLAD, 90% pLCx.  s/p CABG x 3 with MV repair,  LIMA-LAD, SVG-OM2, SVG-PDA at [facility]. He has an ischemic cardiomyopathy, most recent echo in 2/24 showed EF 45-50%, normal RV, s/p MV repair with no MR and mean gradient 4 mmHg, IVC normal. He has PAD with below-the-knee PTCAs on left in 11/23, then left TMA in 1/24.  He had atrial fibrillation only noted at the time of CABG (post-op) and is not anticoagulated.    Patient has struggled recently with volume overload.  His spironolactone was stopped with creatinine rise in 3/24.  He did not tolerate Entresto in the past. He saw Dr. Azucena Cecil last week, he was on torsemide 60 mg daily at that time.  He was started on metolazone 5 mg daily at that visit (on 5/3) and has been taking daily metolazone since that time. His edema has improved but is still present.  He is still short of breath walking around his house and walking into the office today.  He has a chronic cough.  No chest pain.  Abdomen is distended.  Mild orthopnea.  No lightheadedness.   ECG (personally reviewed): NSR, RBBB  REDS clip 50%  Labs (3/24): LFTs normal, TSH normal Labs (4/24): K 3.9, creatinine 1.4  PMH: 1. Atrial tachycardia/SVT: On amiodarone.  2. CAD: Late-presented inferior MI 6/19, cath with totally occluded RCA, 90% mLAD, 90% pLCx.  s/p CABG x 3 with MV repair,  LIMA-LAD, SVG-OM2, SVG-PDA at [facility].  3. Mitral regurgitation: s/p mitral valve repair in 6/19 with CABG 4. Atrial fibrillation: Noted only post-op with CABG in 6/19.  5. PAD: Peripheral angiography in 11/23 with PTCA left PT, L tibioperoneal trunk, and left AT  - amputation of toe on left foot then left transmetatarsal  amputation in 1/24.  6. Chronic HF with mid-range EF: Ischemic cardiomyopathy.  - Echo (2/24): EF 45-50%, normal RV, s/p MV repair with no MR and mean gradient 4 mmHg, IVC normal.  7. CKD stage 3: With history of AKI.  8. OSA: Not able to tolerate full face mask CPAP.  9. Hyperlipidemia 10. Diabetes 11. HTN 12. Hyperlipidemia  Social History   Socioeconomic History   Marital status: Married    Spouse name: Not on file   Number of children: Not on file   Years of education: Not on file   Highest education level: Not on file  Occupational History   Not on file  Tobacco Use   Smoking status: Never   Smokeless tobacco: Never  Vaping Use   Vaping Use: Never used  Substance and Sexual Activity   Alcohol use: Not Currently    Comment: rare beer   Drug use: No   Sexual activity: Not on file  Other Topics Concern   Not on file  Social History Narrative   Lives locally with wife.  Works in [employer].  Does not routinely exercise.   Social Determinants of Health   Financial Resource Strain: Not on file  Food Insecurity: No Food Insecurity (07/31/2022)   Hunger Vital Sign    Worried About Running Out of Food in the Last Year: Never true    Ran Out of Food in the Last Year: Never  true  Recent Concern: Food Insecurity - Food Insecurity Present (06/02/2022)   Hunger Vital Sign    Worried About Running Out of Food in the Last Year: Sometimes true    Ran Out of Food in the Last Year: Sometimes true  Transportation Needs: No Transportation Needs (07/31/2022)   PRAPARE - Administrator, Civil Service (Medical): No    Lack of Transportation (Non-Medical): No  Physical Activity: Not on file  Stress: Not on file  Social Connections: Not on file  Intimate Partner Violence: Not At Risk (07/31/2022)   Humiliation, Afraid, Rape, and Kick questionnaire    Fear of Current or Ex-Partner: No    Emotionally Abused: No    Physically Abused: No    Sexually Abused: No   Family  History  Problem Relation Age of Onset   Cancer Mother        died @ 73   CAD Father        First MI @ 84. S/p heart transplant. Died in mid-50's of cancer.   Cancer Father    Other Brother        alive and well   ROS: All systems reviewed and negative except as per HPI.    Current Outpatient Medications  Medication Sig Dispense Refill   amiodarone (PACERONE) 200 MG tablet Take 1 tablet (200 mg total) by mouth daily. 90 tablet 0   ascorbic acid (VITAMIN C) 500 MG tablet Take 1 tablet (500 mg total) by mouth 2 (two) times daily. 60 tablet 3   aspirin 81 MG EC tablet Take 81 mg by mouth daily.      b complex vitamins tablet Take 1 tablet by mouth daily.      clopidogrel (PLAVIX) 75 MG tablet Take 1 tablet (75 mg total) by mouth daily. 30 tablet 6   dapagliflozin propanediol (FARXIGA) 10 MG TABS tablet Take 1 tablet (10 mg total) by mouth daily before breakfast. 30 tablet 6   gabapentin (NEURONTIN) 100 MG capsule Take 1 capsule by mouth twice daily 60 capsule 0   insulin degludec (TRESIBA) 200 UNIT/ML FlexTouch Pen 32 Units at bedtime.     losartan (COZAAR) 25 MG tablet Take 1 tablet (25 mg total) by mouth daily. HOLD UNTIL FOLLOW-UP WITH YOUR DOCTOR     metFORMIN (GLUCOPHAGE) 500 MG tablet Take 2 tablets (1,000 mg total) by mouth 2 (two) times daily. 360 tablet 2   metolazone (ZAROXOLYN) 5 MG tablet Take 1 tablet (5 mg total) by mouth daily. 90 tablet 0   metoprolol succinate (TOPROL-XL) 50 MG 24 hr tablet TAKE 1 TABLET BY MOUTH DAILY. TAKE WITH OR IMMEDIATELY FOLLOWING A MEAL. 90 tablet 0   Omega-3 Fatty Acids (FISH OIL) 875 MG CAPS Take 875 mg by mouth 2 (two) times daily.     potassium chloride SA (KLOR-CON M) 20 MEQ tablet Take 1 tablet (20 mEq total) by mouth daily. 90 tablet 3   pramipexole (MIRAPEX) 1.5 MG tablet Take 1 tablet (1.5 mg total) by mouth 2 (two) times daily. 180 tablet 3   rosuvastatin (CRESTOR) 20 MG tablet Take 1 tablet (20 mg total) by mouth daily. 90 tablet 0    torsemide (DEMADEX) 20 MG tablet Take 3 tablets (60 mg total) by mouth daily. 40 mg daily (Patient taking differently: Take 60 mg by mouth daily.) 270 tablet 0   Dulaglutide 1.5 MG/0.5ML SOPN Inject 1.5 mg into the skin every Saturday. (Patient not taking: Reported on 10/29/2022)  empagliflozin (JARDIANCE) 25 MG TABS tablet Take 25 mg by mouth daily. (Patient not taking: Reported on 12/09/2022)     No current facility-administered medications for this visit.    BP 137/74   Pulse 69   Wt 232 lb 3.2 oz (105.3 kg)   SpO2 100%   BMI 35.31 kg/m  General: NAD Neck: JVP 14 cm, no thyromegaly or thyroid nodule.  Lungs: Clear to auscultation bilaterally with normal respiratory effort. CV: Nondisplaced PMI.  Heart regular S1/S2, no S3/S4, no murmur.  2+ edema to knees.  No carotid bruit.  Difficult to palpate pedal pulses.  Abdomen: Soft, nontender, no hepatosplenomegaly, moderate distention.  Skin: Intact without lesions or rashes.  Neurologic: Alert and oriented x 3.  Psych: Normal affect. Extremities: No clubbing or cyanosis. Left TMA  HEENT: Normal.   Assessment/Plan: 1. Chronic HF with mid range EF: Ischemic cardiomyopathy.  Echo in 2/24 with EF 45-50%, normal RV, s/p MV repair with no MR and mean gradient 4 mmHg, IVC normal. He is still volume overloaded on exam and by REDS clip with NYHA class IIIb symptoms. He has been taking torsemide 60 mg daily and metolazone 5 mg daily for about 5 days.   - I will have him take Furoscix 80 mg Lyman daily x 3 days starting this evening.  - Stop torsemide for now, restart torsemide at 60 mg qam/40 mg qpm on Saturday (last does of Furoscix on Friday).  - Stop metolazone for now, restart at 2.5 mg twice weekly on Mondays and Fridays, start next Monday.   - Start KCl 20 daily.  - Start Farxiga 10 daily.  - BMET/BNP today and BMET again in 1 week.  Plan may change if creatinine is significantly high on BMET today after getting daily metolazone for a number of  days.  - Continue losartan 25 mg daily. He did not tolerate Entresto in the past per report.  - Continue Toprol XL 50 mg daily.  - Off spironolactone after episode of AKI, will try to restart low dose in the future.  2. CAD: Late presentation inferior MI in 6/19 with CABG x 3 at [facility].  No chest pain.  - Continue ASA 81 and Plavix 75 daily for now.  Could consider dropping ASA in the future.  - Continue statin, check lipids today.  3. Atrial fibrillation: Only noted post-op after CABG in 2019.  No palpitations.  - Anticoagulate if atrial fibrillation is noted to recur.  4. SVT/atrial tachycardia: Patient is followed by EP for this.  He has been on amiodarone to suppress.  - Continue amiodarone, check LFTs and TSH today.  He will need regular eye exam.  5. PAD: Had PTCAs to below the knee arteries on left in 11/23.  In 1/24, he had left transmetatarsal amputation. He is a nonsmoker.  - Followed by vascular surgery.  - Continue ASA, Plavix, statin.  6. CKD 3: Follow BMET carefully, especially with heavy metolazone use recently.  7. Mitral regurgitation: S/p MV repair with CABG.  Valve was stable on last echo.   Followup next week for reassessment.   Marca Ancona 12/10/2022

## 2022-12-10 NOTE — Telephone Encounter (Signed)
Prescription filled.

## 2022-12-15 ENCOUNTER — Telehealth: Payer: Self-pay | Admitting: Pharmacist

## 2022-12-15 DIAGNOSIS — I5020 Unspecified systolic (congestive) heart failure: Secondary | ICD-10-CM

## 2022-12-15 MED ORDER — TORSEMIDE 20 MG PO TABS: ORAL_TABLET | ORAL | 3 refills | Status: DC

## 2022-12-15 NOTE — Telephone Encounter (Signed)
Fax received from [facility] pharmacy requesting new rx for Torsemide.  Sent for 30d supply with 3 refills

## 2022-12-16 ENCOUNTER — Telehealth: Payer: Self-pay

## 2022-12-16 ENCOUNTER — Ambulatory Visit (INDEPENDENT_AMBULATORY_CARE_PROVIDER_SITE_OTHER): Payer: Medicaid Other | Admitting: Family Medicine

## 2022-12-16 ENCOUNTER — Encounter: Payer: Self-pay | Admitting: Family Medicine

## 2022-12-16 VITALS — BP 138/68 | HR 66 | Ht 68.0 in | Wt 222.0 lb

## 2022-12-16 DIAGNOSIS — E785 Hyperlipidemia, unspecified: Secondary | ICD-10-CM

## 2022-12-16 DIAGNOSIS — N170 Acute kidney failure with tubular necrosis: Secondary | ICD-10-CM | POA: Diagnosis not present

## 2022-12-16 DIAGNOSIS — E1140 Type 2 diabetes mellitus with diabetic neuropathy, unspecified: Secondary | ICD-10-CM | POA: Insufficient documentation

## 2022-12-16 DIAGNOSIS — E66811 Obesity, class 1: Secondary | ICD-10-CM

## 2022-12-16 DIAGNOSIS — I152 Hypertension secondary to endocrine disorders: Secondary | ICD-10-CM

## 2022-12-16 DIAGNOSIS — Z8673 Personal history of transient ischemic attack (TIA), and cerebral infarction without residual deficits: Secondary | ICD-10-CM

## 2022-12-16 DIAGNOSIS — I501 Left ventricular failure: Secondary | ICD-10-CM | POA: Diagnosis not present

## 2022-12-16 DIAGNOSIS — E1169 Type 2 diabetes mellitus with other specified complication: Secondary | ICD-10-CM | POA: Insufficient documentation

## 2022-12-16 DIAGNOSIS — Z89422 Acquired absence of other left toe(s): Secondary | ICD-10-CM

## 2022-12-16 DIAGNOSIS — I5042 Chronic combined systolic (congestive) and diastolic (congestive) heart failure: Secondary | ICD-10-CM

## 2022-12-16 DIAGNOSIS — I739 Peripheral vascular disease, unspecified: Secondary | ICD-10-CM

## 2022-12-16 DIAGNOSIS — N1831 Chronic kidney disease, stage 3a: Secondary | ICD-10-CM | POA: Diagnosis not present

## 2022-12-16 DIAGNOSIS — E1159 Type 2 diabetes mellitus with other circulatory complications: Secondary | ICD-10-CM

## 2022-12-16 DIAGNOSIS — I25810 Atherosclerosis of coronary artery bypass graft(s) without angina pectoris: Secondary | ICD-10-CM

## 2022-12-16 DIAGNOSIS — K08109 Complete loss of teeth, unspecified cause, unspecified class: Secondary | ICD-10-CM

## 2022-12-16 DIAGNOSIS — D631 Anemia in chronic kidney disease: Secondary | ICD-10-CM

## 2022-12-16 DIAGNOSIS — N183 Anemia in chronic kidney disease: Secondary | ICD-10-CM

## 2022-12-16 DIAGNOSIS — Z7689 Persons encountering health services in other specified circumstances: Secondary | ICD-10-CM

## 2022-12-16 DIAGNOSIS — E669 Obesity, unspecified: Secondary | ICD-10-CM | POA: Insufficient documentation

## 2022-12-16 NOTE — Assessment & Plan Note (Signed)
Chronic, borderline elevation Likely in setting of increased volume status with need for diuretic monitoring Goal discussed <129/<79 Continue to work on diet and lifestyle management and allow specialities to work together to best control chronic disease processes

## 2022-12-16 NOTE — Assessment & Plan Note (Signed)
Chronic, in setting of CKD Continue to recommend follow up with nephro at previous scheduled intervals

## 2022-12-16 NOTE — Assessment & Plan Note (Signed)
Chronic, known CAD on high dose statin, Crestor 20 mg LDL goal <70 given previous CABG and CVA

## 2022-12-16 NOTE — Assessment & Plan Note (Signed)
Chronic, stable Elevated creatinine with last eGFR of 44 Continue to monitor sodium, phosphorus and potassium intake

## 2022-12-16 NOTE — Assessment & Plan Note (Signed)
Chronic, with acute edema flare  Followed by cardiology and heart failure

## 2022-12-16 NOTE — Assessment & Plan Note (Signed)
Noted; continue to monitor LDL goal <70

## 2022-12-16 NOTE — Assessment & Plan Note (Signed)
Due for medicare initial appt; will establish today and schedule another appt Notes multiple specialists Believes he is up to date on all care; will look for [facility] records for vaccine

## 2022-12-16 NOTE — Progress Notes (Signed)
New patient visit  Patient: Ricardo Brown   DOB: 20-Oct-1960   61 y.o. Male  MRN: [id number] Visit Date: 12/16/2022  Today's healthcare provider: Jacky Kindle, FNP  Patient presents for new patient visit to establish care.  Introduced to Publishing rights manager role and practice setting.  All questions answered.  Discussed provider/patient relationship and expectations.  Chief Complaint  Patient presents with   New Patient (Initial Visit)   Subjective    Ricardo Brown is a 62 y.o. male who presents today as a new patient to establish care.  His previous PCP is retiring.  HPI HPI   Establish  Last edited by Shelly Bombard, CMA on 12/16/2022  8:56 AM.      Past Medical History:  Diagnosis Date   Chronic combined systolic (congestive) and diastolic (congestive) heart failure (HCC)    a. 01/2018 EF 35-40%; b. 10/2020 Echo: EF 40-45%, glob HK. RVSP 42.27mmHg. Mildly dil LA. Mild MS/AoV sclerosis.   CKD (chronic kidney disease), stage Brown (HCC)    Coronary artery disease    a. 01/2018 late presenting inferior MI; b. 01/2018 Cath: Severe multivessel dzs-->CABG x 3 (LIMA->LAD, VG->OM2, VG->RPDA @ [facility]).   Diabetes mellitus without complication (HCC)    a. Dx ~ 2000.   Heart palpitations    a. Pt reports prior nl echo's and stress tests. Last stress test w/in past 2 yrs - PCP.   High cholesterol    Hypertension    Mitral regurgitation    a. 01/2018 s/p MV repair @ time of CABG.   Myocardial infarction [facility])    Neuropathy    legs and hands   Postoperative atrial fibrillation    a. 01/2018 @ time of CABG.   PSVT (paroxysmal supraventricular tachycardia)    a. Very symptomatic with multiple ED evaluations.  Has been on amio.   Severe sepsis with acute organ dysfunction (HCC) 12/19/2021   Sleep apnea    "mild - has CPAP, doesn't use   Sleep difficulties 09/07/2019   STEMI (ST elevation myocardial infarction) (HCC) 01/24/2018   Wears dentures    full upper and lower    Past Surgical History:  Procedure Laterality Date   AMPUTATION TOE Right 08/29/2021   Procedure: AMPUTATION TOE-2nd toe;  Surgeon: Linus Galas, DPM;  Location: [facility];  Service: Podiatry;  Laterality: Right;   AMPUTATION TOE Left 12/22/2021   Procedure: AMPUTATION TOE-2nd Toe Partial;  Surgeon: Rosetta Posner, DPM;  Location: [facility];  Service: Podiatry;  Laterality: Left;   CARDIAC CATHETERIZATION     CARDIAC VALVE REPLACEMENT     Mitral Valve Repair   CATARACT EXTRACTION W/PHACO Left 10/08/2022   Procedure: CATARACT EXTRACTION PHACO AND INTRAOCULAR LENS PLACEMENT (IOC) LEFT DIABETIC OMIDRIA 20.94 01:35.9;  Surgeon: Estanislado Pandy, MD;  Location: [facility];  Service: Ophthalmology;  Laterality: Left;  Diabetic   COLONOSCOPY WITH PROPOFOL N/A 06/07/2019   Procedure: COLONOSCOPY WITH PROPOFOL;  Surgeon: Pasty Spillers, MD;  Location: [facility];  Service: Endoscopy;  Laterality: N/A;   COLONOSCOPY WITH PROPOFOL N/A 02/07/2020   Procedure: COLONOSCOPY WITH PROPOFOL;  Surgeon: Pasty Spillers, MD;  Location: [facility];  Service: Endoscopy;  Laterality: N/A;   COLONOSCOPY WITH PROPOFOL N/A 12/11/2020   Procedure: COLONOSCOPY WITH PROPOFOL;  Surgeon: Pasty Spillers, MD;  Location: [facility];  Service: Endoscopy;  Laterality: N/A;   CORONARY ANGIOPLASTY     CORONARY ARTERY BYPASS GRAFT     x 3  01/2018   ESOPHAGOGASTRODUODENOSCOPY N/A 02/07/2020   Procedure: ESOPHAGOGASTRODUODENOSCOPY (EGD);  Surgeon: Pasty Spillers, MD;  Location[facility];  Service: Endoscopy;  Laterality: N/A;   INCISION AND DRAINAGE Left 06/05/2022   Procedure: INCISION AND DRAINAGE;  Surgeon: Rosetta Posner, DPM;  Location: [facility];  Service: Podiatry;  Laterality: Left;   LEFT HEART CATH AND CORONARY ANGIOGRAPHY N/A 01/24/2018   Procedure: LEFT HEART CATH AND CORONARY ANGIOGRAPHY;  Surgeon: Iran Ouch, MD;  Location: [facility] IN[facility];  Service: Cardiovascular;   Laterality: N/A;   LOWER EXTREMITY ANGIOGRAPHY Left 06/08/2022   Procedure: Lower Extremity Angiography;  Surgeon: Annice Needy, MD;  Location: [facility];  Service: Cardiovascular;  Laterality: Left;   SVT ABLATION N/A 09/12/2021   Procedure: SVT ABLATION;  Surgeon: Lanier Prude, MD;  Location: [facility] INVASIVE CV LAB;  Service: Cardiovascular;  Laterality: N/A;   TONSILLECTOMY     TRANSMETATARSAL AMPUTATION Left 08/04/2022   Procedure: TRANSMETATARSAL AMPUTATION;  Surgeon: Linus Galas, DPM;  Location: [facility];  Service: Podiatry;  Laterality: Left;   Family Status  Relation Name Status   Mother  Deceased   Father  Deceased   Brother  Alive   Family History  Problem Relation Age of Onset   Cancer Mother        died @ 10   CAD Father        First MI @ 65. S/p heart transplant. Died in mid-50's of cancer.   Cancer Father    Other Brother        alive and well   Social History   Socioeconomic History   Marital status: Married    Spouse name: Not on file   Number of children: Not on file   Years of education: Not on file   Highest education level: Not on file  Occupational History   Not on file  Tobacco Use   Smoking status: Never   Smokeless tobacco: Never  Vaping Use   Vaping Use: Never used  Substance and Sexual Activity   Alcohol use: Not Currently    Comment: rare beer   Drug use: No   Sexual activity: Not on file  Other Topics Concern   Not on file  Social History Narrative   Lives locally with wife.  Works in [employer].  Does not routinely exercise.   Social Determinants of Health   Financial Resource Strain: Not on file  Food Insecurity: No Food Insecurity (07/31/2022)   Hunger Vital Sign    Worried About Running Out of Food in the Last Year: Never true    Ran Out of Food in the Last Year: Never true  Recent Concern: Food Insecurity - Food Insecurity Present (06/02/2022)   Hunger Vital Sign    Worried About Running Out of Food in the Last  Year: Sometimes true    Ran Out of Food in the Last Year: Sometimes true  Transportation Needs: No Transportation Needs (07/31/2022)   PRAPARE - Administrator, Civil Service (Medical): No    Lack of Transportation (Non-Medical): No  Physical Activity: Not on file  Stress: Not on file  Social Connections: Not on file   Outpatient Medications Prior to Visit  Medication Sig   amiodarone (PACERONE) 200 MG tablet Take 1 tablet (200 mg total) by mouth daily.   ascorbic acid (VITAMIN C) 500 MG tablet Take 1 tablet (500 mg total) by mouth 2 (two) times daily.   aspirin 81  MG EC tablet Take 81 mg by mouth daily.    b complex vitamins tablet Take 1 tablet by mouth daily.    benzonatate (TESSALON) 200 MG capsule Take by mouth.   clopidogrel (PLAVIX) 75 MG tablet Take 1 tablet (75 mg total) by mouth daily.   dapagliflozin propanediol (FARXIGA) 10 MG TABS tablet Take 1 tablet (10 mg total) by mouth daily before breakfast.   doxycycline (VIBRA-TABS) 100 MG tablet Take by mouth.   gabapentin (NEURONTIN) 100 MG capsule Take 1 capsule by mouth twice daily   insulin degludec (TRESIBA) 200 UNIT/ML FlexTouch Pen 32 Units at bedtime.   losartan (COZAAR) 25 MG tablet Take 1 tablet (25 mg total) by mouth daily. HOLD UNTIL FOLLOW-UP WITH YOUR DOCTOR   metFORMIN (GLUCOPHAGE) 500 MG tablet Take 2 tablets (1,000 mg total) by mouth 2 (two) times daily.   metolazone (ZAROXOLYN) 5 MG tablet Take 1 tablet (5 mg total) by mouth daily.   metoprolol succinate (TOPROL-XL) 50 MG 24 hr tablet TAKE 1 TABLET BY MOUTH DAILY. TAKE WITH OR IMMEDIATELY FOLLOWING A MEAL.   Omega-3 Fatty Acids (FISH OIL) 875 MG CAPS Take 875 mg by mouth 2 (two) times daily.   potassium chloride SA (KLOR-CON M) 20 MEQ tablet Take 1 tablet (20 mEq total) by mouth daily.   pramipexole (MIRAPEX) 1.5 MG tablet Take 1 tablet (1.5 mg total) by mouth 2 (two) times daily.   rosuvastatin (CRESTOR) 20 MG tablet Take 1 tablet (20 mg total) by mouth  daily.   torsemide (DEMADEX) 20 MG tablet Take 60mg  ( 3 tablets) every morning and 40mg  (2 tablets)every afternoon.   [DISCONTINUED] Dulaglutide 1.5 MG/0.5ML SOPN Inject 1.5 mg into the skin every Saturday. (Patient not taking: Reported on 10/29/2022)   [DISCONTINUED] empagliflozin (JARDIANCE) 25 MG TABS tablet Take 25 mg by mouth daily. (Patient not taking: Reported on 12/09/2022)   No facility-administered medications prior to visit.   Allergies  Allergen Reactions   Lipitor [Atorvastatin] Palpitations    SVT   Sacubitril-Valsartan Other (See Comments)    SVT   Pregabalin Palpitations    SVT   There is no immunization history on file for this patient.  Health Maintenance  Topic Date Due   Medicare Annual Wellness (AWV)  Never done   Diabetic kidney evaluation - Urine ACR  Never done   Hepatitis C Screening  Never done   DTaP/Tdap/Td (1 - Tdap) Never done   Zoster Vaccines- Shingrix (1 of 2) Never done   HEMOGLOBIN A1C  11/26/2022   COLONOSCOPY (Pts 45-95yrs Insurance coverage will need to be confirmed)  12/23/2022 (Originally 12/11/2021)   FOOT EXAM  03/31/2023   OPHTHALMOLOGY EXAM  07/15/2023   Diabetic kidney evaluation - eGFR measurement  12/09/2023   HIV Screening  Completed   HPV VACCINES  Aged Out   INFLUENZA VACCINE  Discontinued   COVID-19 Vaccine  Discontinued   Patient Care Team: Jacky Kindle, FNP as PCP - General (Family Medicine) Debbe Odea, MD as PCP - Cardiology (Cardiology) Lanier Prude, MD as PCP - Electrophysiology (Cardiology) Lorain Childes, MD as Consulting Physician (Nephrology) Tedd Sias Marlana Salvage, MD as Physician Assistant (Endocrinology) Wyline Mood, MD as Consulting Physician (Gastroenterology) Nicholaus Corolla, MD as Referring Physician (Ophthalmology) Johnsie Kindred, DO (Optometry) Bensimhon, Bevelyn Buckles, MD as Consulting Physician (Cardiology)  Review of Systems  Last CBC Lab Results  Component Value Date   WBC 6.5 08/10/2022   HGB 8.5  (L) 08/10/2022   HCT 27.0 (L)  08/10/2022   MCV 88.5 08/10/2022   MCH 27.9 08/10/2022   RDW 14.1 08/10/2022   PLT 253 08/10/2022   Last metabolic panel Lab Results  Component Value Date   GLUCOSE 128 (H) 12/09/2022   NA 137 12/09/2022   K 4.0 12/09/2022   CL 101 12/09/2022   CO2 27 12/09/2022   BUN 41 (H) 12/09/2022   CREATININE 1.74 (H) 12/09/2022   GFRNONAA 44 (L) 12/09/2022   CALCIUM 8.7 (L) 12/09/2022   PHOS 3.9 05/27/2022   PROT 7.3 12/09/2022   ALBUMIN 4.2 12/09/2022   BILITOT 0.8 12/09/2022   ALKPHOS 67 12/09/2022   AST 23 12/09/2022   ALT 20 12/09/2022   ANIONGAP 9 12/09/2022   Last lipids Lab Results  Component Value Date   CHOL 109 12/09/2022   HDL 28 (L) 12/09/2022   LDLCALC 52 12/09/2022   TRIG 147 12/09/2022   CHOLHDL 3.9 12/09/2022   Last hemoglobin A1c Lab Results  Component Value Date   HGBA1C 6.9 (H) 05/27/2022   Last thyroid functions Lab Results  Component Value Date   TSH 4.261 12/09/2022    Objective    BP 138/68 (BP Location: Right Arm, Patient Position: Sitting, Cuff Size: Large)   Pulse 66   Ht 5\' 8"  (1.727 m)   Wt 222 lb (100.7 kg)   SpO2 98%   BMI 33.75 kg/m   BP Readings from Last 3 Encounters:  12/16/22 138/68  12/09/22 137/74  12/04/22 136/70   Wt Readings from Last 3 Encounters:  12/16/22 222 lb (100.7 kg)  12/09/22 232 lb 3.2 oz (105.3 kg)  12/04/22 235 lb (106.6 kg)   SpO2 Readings from Last 3 Encounters:  12/16/22 98%  12/09/22 100%  12/04/22 97%      Physical Exam Vitals and nursing note reviewed.  Constitutional:      Appearance: Normal appearance. He is obese.  HENT:     Head: Normocephalic and atraumatic.  Cardiovascular:     Rate and Rhythm: Normal rate and regular rhythm.     Pulses: Normal pulses.     Heart sounds: Normal heart sounds.  Pulmonary:     Effort: Pulmonary effort is normal.     Breath sounds: Normal breath sounds.  Musculoskeletal:        General: Swelling present. Normal range  of motion.     Cervical back: Normal range of motion.     Comments: Arterial changes noted to BLE; mid shin down  Skin:    General: Skin is warm and dry.     Capillary Refill: Capillary refill takes less than 2 seconds.  Neurological:     General: No focal deficit present.     Mental Status: He is alert and oriented to person, place, and time. Mental status is at baseline.     Gait: Gait abnormal.  Psychiatric:        Mood and Affect: Mood normal.        Behavior: Behavior normal.        Thought Content: Thought content normal.        Judgment: Judgment normal.     Depression Screen    12/16/2022    9:01 AM 08/20/2022   10:10 AM 09/10/2021   12:01 PM 04/21/2021    9:33 AM  PHQ 2/9 Scores  PHQ - 2 Score 0 0 0 0  PHQ- 9 Score 0      No results found for any visits on 12/16/22.  Assessment & Plan  Problem List Items Addressed This Visit       Cardiovascular and Mediastinum   Chronic combined systolic (congestive) and diastolic (congestive) heart failure (HCC)    Chronic, with acute edema flare  Followed by cardiology and heart failure       Coronary artery disease    Chronic, known CAD on high dose statin, Crestor 20 mg LDL goal <70 given previous CABG and CVA      Hypertension associated with diabetes (HCC)    Chronic, borderline elevation Likely in setting of increased volume status with need for diuretic monitoring Goal discussed <129/<79 Continue to work on diet and lifestyle management and allow specialities to work together to best control chronic disease processes       PAD (peripheral artery disease) (HCC)    Chronic, stable Continue to monitor CHF, HLD, T2DM s/s Continue to recommend weight mgmt to assist      Pulmonary edema with congestive heart failure with reduced left ventricular function (HCC) - Primary    Acute increase in volume status with change from dry weight at 209 to 230's Seen by UC and heart failure specialist earlier this week and  last and working on fluid mgmt and symptom control including acute cough from edema. Notes previously on lasix and now on torsemide. Was using metolazone daily; now using 2x/week with IV/SQ diuretic.       PVD (peripheral vascular disease) (HCC)    Chronic, stable Continues on Plavix 75 mg, ASA 81 mg, Gaba 100 mg BID Encouraged to use walking as primary exercise to build collateral vessels         Endocrine   Hyperlipidemia associated with type 2 diabetes mellitus (HCC)    Chronic, on high dose statin LDL goal 55-70 with chronic conditions Defer repeat at this time recommend diet low in saturated fat and regular exercise - 30 min at least 5 times per week       Type 2 diabetes mellitus with diabetic neuropathy, without long-term current use of insulin (HCC)    Chronic, last A1c 7.1% Defers check today noting appt tomorrow with endo Continue to recommend balanced, lower carb meals. Smaller meal size, adding snacks. Choosing water as drink of choice and increasing purposeful exercise.         Genitourinary   Acute kidney injury (AKI) with acute tubular necrosis (ATN) (HCC)    In directed change of diuretic under care of cards/CHF team  Continue to monitor fluid state      Stage 3a chronic kidney disease (CKD) (HCC)    Chronic, stable Elevated creatinine with last eGFR of 44 Continue to monitor sodium, phosphorus and potassium intake        Other   Acquired absence of other left toe(s) (HCC)    Chronic, hx of osteo in setting of PAD/PVD/ASCVD with T2DM Slight imbalance in gait; no assistive device Followed by podiatry       Anemia    Chronic, in setting of CKD Continue to recommend follow up with nephro at previous scheduled intervals       Class 1 obesity with serious comorbidity and body mass index (BMI) of 33.0 to 33.9 in adult    Body mass index is 33.75 kg/m. Discussed importance of healthy weight management Discussed diet and exercise       Edentulous     Chronic, does not see a dentist Notes poor fitting dentures Continue to monitor oral health given CHFrEF      Encounter to  establish care    Due for medicare initial appt; will establish today and schedule another appt Notes multiple specialists Believes he is up to date on all care; will look for [facility] records for vaccine       History of stroke    Noted; continue to monitor LDL goal <70       Return for annual examination.    Leilani Merl, FNP, have reviewed all documentation for this visit. The documentation on 12/16/22 for the exam, diagnosis, procedures, and orders are all accurate and complete.  Jacky Kindle, FNP  [facility] [phone] (phone) [phone] (fax)  [facility]

## 2022-12-16 NOTE — Assessment & Plan Note (Signed)
Chronic, on high dose statin LDL goal 55-70 with chronic conditions Defer repeat at this time recommend diet low in saturated fat and regular exercise - 30 min at least 5 times per week

## 2022-12-16 NOTE — Assessment & Plan Note (Signed)
Chronic, stable Continues on Plavix 75 mg, ASA 81 mg, Gaba 100 mg BID Encouraged to use walking as primary exercise to build collateral vessels

## 2022-12-16 NOTE — Assessment & Plan Note (Signed)
Chronic, hx of osteo in setting of PAD/PVD/ASCVD with T2DM Slight imbalance in gait; no assistive device Followed by podiatry

## 2022-12-16 NOTE — Assessment & Plan Note (Signed)
Body mass index is 33.75 kg/m. Discussed importance of healthy weight management Discussed diet and exercise

## 2022-12-16 NOTE — Telephone Encounter (Signed)
Pt insurance requires prior auth for furoscix. Last clinical notes faxed to [employer] direct, [phone], per request.

## 2022-12-16 NOTE — Patient Instructions (Signed)
The CDC recommends two doses of Shingrix (the new shingles vaccine) separated by 2 to 6 months for adults age 62 years and older. I recommend checking with your insurance plan regarding coverage for this vaccine.    Please schedule wellness/welcome to medicare appt.   It was a pleasure to meet you; please let us know if you need medication refills.  Jacky Kindle, FNP  [facility] [address] [place], [place] [address] [phone] (phone) [phone] (fax) [facility]

## 2022-12-16 NOTE — Assessment & Plan Note (Signed)
In directed change of diuretic under care of cards/CHF team  Continue to monitor fluid state

## 2022-12-16 NOTE — Assessment & Plan Note (Signed)
Chronic, does not see a dentist Notes poor fitting dentures Continue to monitor oral health given CHFrEF

## 2022-12-16 NOTE — Assessment & Plan Note (Signed)
Chronic, stable Continue to monitor CHF, HLD, T2DM s/s Continue to recommend weight mgmt to assist

## 2022-12-16 NOTE — Assessment & Plan Note (Signed)
Acute increase in volume status with change from dry weight at 209 to 230's Seen by UC and heart failure specialist earlier this week and last and working on fluid mgmt and symptom control including acute cough from edema. Notes previously on lasix and now on torsemide. Was using metolazone daily; now using 2x/week with IV/SQ diuretic.

## 2022-12-16 NOTE — Assessment & Plan Note (Signed)
Chronic, last A1c 7.1% Defers check today noting appt tomorrow with endo Continue to recommend balanced, lower carb meals. Smaller meal size, adding snacks. Choosing water as drink of choice and increasing purposeful exercise.

## 2022-12-17 ENCOUNTER — Other Ambulatory Visit
Admission: RE | Admit: 2022-12-17 | Discharge: 2022-12-17 | Disposition: A | Discharge status: Home / Self Care | Payer: 59 | Source: Ambulatory Visit | Attending: Internal Medicine | Admitting: Internal Medicine

## 2022-12-17 ENCOUNTER — Ambulatory Visit (HOSPITAL_BASED_OUTPATIENT_CLINIC_OR_DEPARTMENT_OTHER): Payer: 59 | Admitting: Internal Medicine

## 2022-12-17 ENCOUNTER — Encounter: Payer: Self-pay | Admitting: Internal Medicine

## 2022-12-17 VITALS — BP 148/71 | HR 71 | Wt 219.8 lb

## 2022-12-17 DIAGNOSIS — I5042 Chronic combined systolic (congestive) and diastolic (congestive) heart failure: Secondary | ICD-10-CM

## 2022-12-17 DIAGNOSIS — N183 Chronic kidney disease, stage 3 unspecified: Secondary | ICD-10-CM | POA: Diagnosis not present

## 2022-12-17 DIAGNOSIS — I2510 Atherosclerotic heart disease of native coronary artery without angina pectoris: Secondary | ICD-10-CM | POA: Diagnosis not present

## 2022-12-17 DIAGNOSIS — E785 Hyperlipidemia, unspecified: Secondary | ICD-10-CM | POA: Diagnosis not present

## 2022-12-17 LAB — BASIC METABOLIC PANEL
Anion gap: 12 (ref 5–15)
BUN: 69 mg/dL — ABNORMAL HIGH (ref 8–23)
CO2: 28 mmol/L (ref 22–32)
Calcium: 9.6 mg/dL (ref 8.9–10.3)
Chloride: 96 mmol/L — ABNORMAL LOW (ref 98–111)
Creatinine, Ser: 2.13 mg/dL — ABNORMAL HIGH (ref 0.61–1.24)
GFR, Estimated: 35 mL/min — ABNORMAL LOW (ref >60)
Glucose, Bld: 153 mg/dL — ABNORMAL HIGH (ref 70–99)
Potassium: 5.5 mmol/L — ABNORMAL HIGH (ref 3.5–5.1)
Sodium: 136 mmol/L (ref 135–145)

## 2022-12-17 LAB — BRAIN NATRIURETIC PEPTIDE: B Natriuretic Peptide: 96.6 pg/mL (ref 0.0–100.0)

## 2022-12-17 MED ORDER — METOLAZONE 5 MG PO TABS: 5.0000 mg | ORAL_TABLET | ORAL | 0 refills | Status: DC

## 2022-12-17 NOTE — Progress Notes (Signed)
   12/17/22 1013  ReDS Vest / Clip  Station Marker C  Ruler Value 31  ReDS Value Range (!) > 40  ReDS Actual Value 49

## 2022-12-17 NOTE — Progress Notes (Signed)
ADVANCED HEART FAILURE CLINIC NOTE   PCP: Jacky Kindle, FNP Cardiology: Dr. Azucena Cecil HF Cardiology: Dr. Shirlee Latch  62 y.o. with history of CAD s/p CABG, ischemic cardiomyopathy, and PAD was referred by Dr. Azucena Cecil for evaluation of CHF.  Patient had a delayed presentation inferior MI in 6/19.   Cath showed totally occluded RCA, 90% mLAD, 90% pLCx.  s/p CABG x 3 with MV repair,  LIMA-LAD, SVG-OM2, SVG-PDA at [facility]. He has an ischemic cardiomyopathy, most recent echo in 2/24 showed EF 45-50%, normal RV, s/p MV repair with no MR and mean gradient 4 mmHg, IVC normal. He has PAD with below-the-knee PTCAs on left in 11/23, then left TMA in 1/24.  He had atrial fibrillation only noted at the time of CABG (post-op) and is not anticoagulated.    Patient has struggled recently with volume overload.  His spironolactone was stopped with creatinine rise in 3/24.  He did not tolerate Entresto in the past. He saw Dr. Azucena Cecil on 5/3 he was on torsemide 60 mg daily at that time.  He was started on metolazone 5 mg daily at that visit.  He saw Dr. Shirlee Latch last week on 5/8. Metolazone cut back to Mon/Fri only. Started on Furoscix. Got 2 doses. Had a big diuresis. Weight down 13 pounds in 1 week. Breathing better but still with cough. No dizziness. Saw PCP yesterday and treated for bronchitis.   PFTs 6/23  FEV1 3.15 (93%) FVC 3.44 (77%) DLCO 74%  ECG (personally reviewed): NSR, RBBB  REDS clip 50% -> 49%  Labs (3/24): LFTs normal, TSH normal Labs (4/24): K 3.9, creatinine 1.4  PMH: 1. Atrial tachycardia/SVT: On amiodarone.  2. CAD: Late-presented inferior MI 6/19, cath with totally occluded RCA, 90% mLAD, 90% pLCx.  s/p CABG x 3 with MV repair,  LIMA-LAD, SVG-OM2, SVG-PDA at [facility]  3. Mitral regurgitation: s/p mitral valve repair in 6/19 with CABG 4. Atrial fibrillation: Noted only post-op with CABG in 6/19.  5. PAD: Peripheral angiography in 11/23 with PTCA left PT, L tibioperoneal trunk, and  left AT  - amputation of toe on left foot then left transmetatarsal amputation in 1/24.  6. Chronic HF with mid-range EF: Ischemic cardiomyopathy.  - Echo (2/24): EF 45-50%, normal RV, s/p MV repair with no MR and mean gradient 4 mmHg, IVC normal.  7. CKD stage 3: With history of AKI.  8. OSA: Not able to tolerate full face mask CPAP.  9. Hyperlipidemia 10. Diabetes 11. HTN 12. Hyperlipidemia  Social History   Socioeconomic History   Marital status: Married    Spouse name: Not on file   Number of children: Not on file   Years of education: Not on file   Highest education level: Not on file  Occupational History   Not on file  Tobacco Use   Smoking status: Never   Smokeless tobacco: Never  Vaping Use   Vaping Use: Never used  Substance and Sexual Activity   Alcohol use: Not Currently    Comment: rare beer   Drug use: No   Sexual activity: Not on file  Other Topics Concern   Not on file  Social History Narrative   Lives locally with wife.  Works in [employer].  Does not routinely exercise.   Social Determinants of Health   Financial Resource Strain: Not on file  Food Insecurity: No Food Insecurity (07/31/2022)   Hunger Vital Sign    Worried About Running Out of Food in the Last Year:  Never true    Ran Out of Food in the Last Year: Never true  Recent Concern: Food Insecurity - Food Insecurity Present (06/02/2022)   Hunger Vital Sign    Worried About Running Out of Food in the Last Year: Sometimes true    Ran Out of Food in the Last Year: Sometimes true  Transportation Needs: No Transportation Needs (07/31/2022)   PRAPARE - Administrator, Civil Service (Medical): No    Lack of Transportation (Non-Medical): No  Physical Activity: Not on file  Stress: Not on file  Social Connections: Not on file  Intimate Partner Violence: Not At Risk (07/31/2022)   Humiliation, Afraid, Rape, and Kick questionnaire    Fear of Current or Ex-Partner: No    Emotionally  Abused: No    Physically Abused: No    Sexually Abused: No   Family History  Problem Relation Age of Onset   Cancer Mother        died @ 29   CAD Father        First MI @ 46. S/p heart transplant. Died in mid-50's of cancer.   Cancer Father    Other Brother        alive and well   ROS: All systems reviewed and negative except as per HPI.    Current Outpatient Medications  Medication Sig Dispense Refill   amiodarone (PACERONE) 200 MG tablet Take 1 tablet (200 mg total) by mouth daily. 90 tablet 0   ascorbic acid (VITAMIN C) 500 MG tablet Take 1 tablet (500 mg total) by mouth 2 (two) times daily. 60 tablet 3   aspirin 81 MG EC tablet Take 81 mg by mouth daily.      b complex vitamins tablet Take 1 tablet by mouth daily.      benzonatate (TESSALON) 200 MG capsule Take by mouth.     clopidogrel (PLAVIX) 75 MG tablet Take 1 tablet (75 mg total) by mouth daily. 30 tablet 6   dapagliflozin propanediol (FARXIGA) 10 MG TABS tablet Take 1 tablet (10 mg total) by mouth daily before breakfast. 30 tablet 6   doxycycline (VIBRA-TABS) 100 MG tablet Take by mouth.     gabapentin (NEURONTIN) 100 MG capsule Take 1 capsule by mouth twice daily 60 capsule 0   insulin degludec (TRESIBA) 200 UNIT/ML FlexTouch Pen 32 Units at bedtime.     losartan (COZAAR) 25 MG tablet Take 1 tablet (25 mg total) by mouth daily. HOLD UNTIL FOLLOW-UP WITH YOUR DOCTOR     metFORMIN (GLUCOPHAGE) 500 MG tablet Take 2 tablets (1,000 mg total) by mouth 2 (two) times daily. 360 tablet 2   metolazone (ZAROXOLYN) 5 MG tablet Take 1 tablet (5 mg total) by mouth daily. 90 tablet 0   metoprolol succinate (TOPROL-XL) 50 MG 24 hr tablet TAKE 1 TABLET BY MOUTH DAILY. TAKE WITH OR IMMEDIATELY FOLLOWING A MEAL. 90 tablet 0   Omega-3 Fatty Acids (FISH OIL) 875 MG CAPS Take 875 mg by mouth 2 (two) times daily.     potassium chloride SA (KLOR-CON M) 20 MEQ tablet Take 1 tablet (20 mEq total) by mouth daily. 90 tablet 3   pramipexole  (MIRAPEX) 1.5 MG tablet Take 1 tablet (1.5 mg total) by mouth 2 (two) times daily. 180 tablet 3   rosuvastatin (CRESTOR) 20 MG tablet Take 1 tablet (20 mg total) by mouth daily. 90 tablet 0   torsemide (DEMADEX) 20 MG tablet Take 60mg  ( 3 tablets) every morning and 40mg  (  2 tablets)every afternoon. 150 tablet 3   No current facility-administered medications for this visit.   Wt Readings from Last 3 Encounters:  12/17/22 219 lb 12.8 oz (99.7 kg)  12/16/22 222 lb (100.7 kg)  12/09/22 232 lb 3.2 oz (105.3 kg)     BP (!) 148/71   Pulse 71   Wt 219 lb 12.8 oz (99.7 kg)   SpO2 100%   BMI 33.42 kg/m  General:  Sitting in chair. No resp difficulty HEENT: normal Neck: supple. no JVD. Carotids 2+ bilat; no bruits. No lymphadenopathy or thryomegaly appreciated. Cor: Barrel chested PMI nondisplaced. Regular rate & rhythm. No rubs, gallops or murmurs. Lungs: clear coarse Abdomen: obese soft, nontender, nondistended. No hepatosplenomegaly. No bruits or masses. Good bowel sounds. Extremities: no cyanosis, clubbing, rash, edema Neuro: alert & orientedx3, cranial nerves grossly intact. moves all 4 extremities w/o difficulty. Affect pleasant   Assessment/Plan: 1. Chronic HF with mid range EF: Ischemic cardiomyopathy.  Echo in 2/24 with EF 45-50%, normal RV, s/p MV repair with no MR and mean gradient 4 mmHg, IVC normal. - Volume status much improved. Weight down 13 pounds. Doubt ReDS is accurate given his body habitus (thick chest). Challenge will be to balance volume status with renal function - Continue torsemide at 60 mg qam/40 mg qpm  - Will drop metolazone to 2.5 mg on Monday only. Hold if weight is < 215 - Continue KCl 20 daily.  - Continue Farxiga 10 daily.  - Continue losartan 25 mg daily. He did not tolerate Entresto in the past per report.  - Continue Toprol XL 50 mg daily.  - Off spironolactone after episode of AKI, will try to restart low dose in the future.  - Labs today - F/u in 2  weeks with Dr. Shirlee Latch  - Can consider RHC as needed 2. CAD: Late presentation inferior MI in 6/19 with CABG x 3 at [facility].  No chest pain.  - Continue ASA 81 and Plavix 75 daily for now.  Could consider dropping ASA in the future.  - Continue statin 3. Atrial fibrillation: Only noted post-op after CABG in 2019.  No palpitations.  - Anticoagulate if atrial fibrillation is noted to recur.  4. SVT/atrial tachycardia: Patient is followed by EP for this.  He has been on amiodarone to suppress.  - Continue amiodarone. Recent labs ok. He will need regular eye exam.  5. PAD: Had PTCAs to below the knee arteries on left in 11/23.  In 1/24, he had left transmetatarsal amputation. He is a nonsmoker.  - Followed by vascular surgery.  - Continue ASA, Plavix, statin. Last LDL at goal 52 (5/24)  6. CKD 3A-3B: Follow BMET carefully. Baseline Scr 1.4-1.7 - check labs today - Follows with Nephrology 7. Mitral regurgitation: S/p MV repair with CABG.  Valve was stable on last echo.    Arvilla Meres 12/17/2022

## 2022-12-17 NOTE — Patient Instructions (Signed)
Medication Changes:  Decrease Metolazone to 5 mg EVERY MONDAY, hold if weight is 215 lbs or less  Lab Work:  Labs done today, your results will be available in MyChart, we will contact you for abnormal readings.  Testing/Procedures:  none  Referrals:  none  Special Instructions // Education:  Do the following things EVERYDAY: Weigh yourself in the morning before breakfast. Write it down and keep it in a log. Take your medicines as prescribed Eat low salt foods--Limit salt (sodium) to 2000 mg per day.  Stay as active as you can everyday Limit all fluids for the day to less than 2 liters   Follow-Up in: 2 weeks    If you have any questions or concerns before your next appointment please send [place] a message through mychart or call our office at [phone] Monday-Friday 8 am-5 pm.   If you have an urgent need after hours on the weekend please call your Primary Cardiologist or the Advanced Heart Failure Clinic in [place] at [phone].

## 2022-12-21 ENCOUNTER — Other Ambulatory Visit
Admission: RE | Admit: 2022-12-21 | Discharge: 2022-12-21 | Disposition: A | Discharge status: Home / Self Care | Payer: 59 | Attending: Internal Medicine | Admitting: Internal Medicine

## 2022-12-21 ENCOUNTER — Telehealth: Payer: Self-pay

## 2022-12-21 DIAGNOSIS — I5042 Chronic combined systolic (congestive) and diastolic (congestive) heart failure: Secondary | ICD-10-CM

## 2022-12-21 LAB — BASIC METABOLIC PANEL
Anion gap: 13 (ref 5–15)
BUN: 73 mg/dL — ABNORMAL HIGH (ref 8–23)
CO2: 25 mmol/L (ref 22–32)
Calcium: 9 mg/dL (ref 8.9–10.3)
Chloride: 97 mmol/L — ABNORMAL LOW (ref 98–111)
Creatinine, Ser: 2.14 mg/dL — ABNORMAL HIGH (ref 0.61–1.24)
GFR, Estimated: 34 mL/min — ABNORMAL LOW (ref >60)
Glucose, Bld: 132 mg/dL — ABNORMAL HIGH (ref 70–99)
Potassium: 4.6 mmol/L (ref 3.5–5.1)
Sodium: 135 mmol/L (ref 135–145)

## 2022-12-21 NOTE — Telephone Encounter (Addendum)
  Pt aware, agreeable, and verbalized understanding . Pt will come today for lab work. Lab orders placed.     ----- Message from Dolores Patty, MD T ----- Potassium elevated. BUN/SCR up too.   I called him and told him to stop kcl 20. I also instructed him to stop weekly metolazone and decrease torsemide back to 60 daily. Can take 40 extra in afternoon as needed for weight gain.  Please call him Monday am to repeat BME asap.

## 2022-12-30 ENCOUNTER — Other Ambulatory Visit
Admission: RE | Admit: 2022-12-30 | Discharge: 2022-12-30 | Disposition: A | Discharge status: Home / Self Care | Payer: 59 | Source: Ambulatory Visit | Attending: Cardiology | Admitting: Cardiology

## 2022-12-30 ENCOUNTER — Ambulatory Visit (HOSPITAL_BASED_OUTPATIENT_CLINIC_OR_DEPARTMENT_OTHER): Payer: 59 | Admitting: Cardiology

## 2022-12-30 ENCOUNTER — Encounter: Payer: Self-pay | Admitting: Cardiology

## 2022-12-30 VITALS — BP 142/78 | HR 72 | Wt 217.8 lb

## 2022-12-30 DIAGNOSIS — I5042 Chronic combined systolic (congestive) and diastolic (congestive) heart failure: Secondary | ICD-10-CM | POA: Diagnosis not present

## 2022-12-30 LAB — BASIC METABOLIC PANEL
Anion gap: 13 (ref 5–15)
BUN: 39 mg/dL — ABNORMAL HIGH (ref 8–23)
CO2: 26 mmol/L (ref 22–32)
Calcium: 9.4 mg/dL (ref 8.9–10.3)
Chloride: 100 mmol/L (ref 98–111)
Creatinine, Ser: 1.66 mg/dL — ABNORMAL HIGH (ref 0.61–1.24)
GFR, Estimated: 47 mL/min — ABNORMAL LOW (ref >60)
Glucose, Bld: 109 mg/dL — ABNORMAL HIGH (ref 70–99)
Potassium: 4.1 mmol/L (ref 3.5–5.1)
Sodium: 139 mmol/L (ref 135–145)

## 2022-12-30 LAB — BRAIN NATRIURETIC PEPTIDE: B Natriuretic Peptide: 81.5 pg/mL (ref 0.0–100.0)

## 2022-12-30 MED ORDER — SPIRONOLACTONE 25 MG PO TABS: 12.5000 mg | ORAL_TABLET | Freq: Every day | ORAL | 3 refills | Status: DC

## 2022-12-30 MED ORDER — TORSEMIDE 20 MG PO TABS: ORAL_TABLET | ORAL | Status: DC

## 2022-12-30 NOTE — Progress Notes (Signed)
ADVANCED HEART FAILURE CLINIC NOTE   PCP: Jacky Kindle, FNP Cardiology: Dr. Azucena Cecil HF Cardiology: Dr. Shirlee Latch  62 y.o. with history of CAD s/p CABG, ischemic cardiomyopathy, and PAD was referred by Dr. Azucena Cecil for evaluation of CHF.  Patient had a delayed presentation inferior MI in 6/19.   Cath showed totally occluded RCA, 90% mLAD, 90% pLCx.  s/p CABG x 3 with MV repair,  LIMA-LAD, SVG-OM2, SVG-PDA at [facility]. He has an ischemic cardiomyopathy, most recent echo in 2/24 showed EF 45-50%, normal RV, s/p MV repair with no MR and mean gradient 4 mmHg, IVC normal. He has PAD with below-the-knee PTCAs on left in 11/23, then left TMA in 1/24.  He had atrial fibrillation only noted at the time of CABG (post-op) and is not anticoagulated.    Patient has struggled recently with volume overload.  His spironolactone was stopped with creatinine rise in 3/24.  He did not tolerate Entresto in the past. He saw Dr. Azucena Cecil on 5/3, he was on torsemide 60 mg daily at that time.  He was started on metolazone 5 mg daily at that visit. Creatinine rose to > 2 and metolazone was stopped and torsemide decreased.   He returns for followup of CHF.  Weight has been trending down, he is down another 15 lbs today.  However, since cutting back on diuretics, he has been developing more peripheral edema.  He is short of breath with "long walks" or "strenuous activity" like dragging his trash can to the street. No chest pain.  He has slept in a recliner since his CABG.  No palpitations or lightheadedness.    Labs (3/24): LFTs normal, TSH normal Labs (4/24): K 3.9, creatinine 1.4 Labs (5/24): K 4.6, creatinine 2.14, LFTs normal, LDL 52, TSH normal  PMH: 1. Atrial tachycardia/SVT: On amiodarone.  2. CAD: Late-presented inferior MI 6/19, cath with totally occluded RCA, 90% mLAD, 90% pLCx.  s/p CABG x 3 with MV repair,  LIMA-LAD, SVG-OM2, SVG-PDA at [facility].  3. Mitral regurgitation: s/p mitral valve repair in 6/19 with  CABG 4. Atrial fibrillation: Noted only post-op with CABG in 6/19.  5. PAD: Peripheral angiography in 11/23 with PTCA left PT, L tibioperoneal trunk, and left AT  - amputation of toe on left foot then left transmetatarsal amputation in 1/24.  6. Chronic HF with mid-range EF: Ischemic cardiomyopathy.  - Echo (2/24): EF 45-50%, normal RV, s/p MV repair with no MR and mean gradient 4 mmHg, IVC normal.  7. CKD stage 3: With history of AKI.  8. OSA: Not able to tolerate full face mask CPAP.  9. Hyperlipidemia 10. Diabetes 11. HTN 12. Hyperlipidemia  Social History   Socioeconomic History   Marital status: Married    Spouse name: Not on file   Number of children: Not on file   Years of education: Not on file   Highest education level: Not on file  Occupational History   Not on file  Tobacco Use   Smoking status: Never   Smokeless tobacco: Never  Vaping Use   Vaping Use: Never used  Substance and Sexual Activity   Alcohol use: Not Currently    Comment: rare beer   Drug use: No   Sexual activity: Not on file  Other Topics Concern   Not on file  Social History Narrative   Lives locally with wife.  Works in [employer].  Does not routinely exercise.   Social Determinants of Health   Financial Resource Strain: Not  on file  Food Insecurity: No Food Insecurity (07/31/2022)   Hunger Vital Sign    Worried About Running Out of Food in the Last Year: Never true    Ran Out of Food in the Last Year: Never true  Recent Concern: Food Insecurity - Food Insecurity Present (06/02/2022)   Hunger Vital Sign    Worried About Running Out of Food in the Last Year: Sometimes true    Ran Out of Food in the Last Year: Sometimes true  Transportation Needs: No Transportation Needs (07/31/2022)   PRAPARE - Administrator, Civil Service (Medical): No    Lack of Transportation (Non-Medical): No  Physical Activity: Not on file  Stress: Not on file  Social Connections: Not on file   Intimate Partner Violence: Not At Risk (07/31/2022)   Humiliation, Afraid, Rape, and Kick questionnaire    Fear of Current or Ex-Partner: No    Emotionally Abused: No    Physically Abused: No    Sexually Abused: No   Family History  Problem Relation Age of Onset   Cancer Mother        died @ 21   CAD Father        First MI @ 46. S/p heart transplant. Died in mid-50's of cancer.   Cancer Father    Other Brother        alive and well   ROS: All systems reviewed and negative except as per HPI.    Current Outpatient Medications  Medication Sig Dispense Refill   amiodarone (PACERONE) 200 MG tablet Take 1 tablet (200 mg total) by mouth daily. 90 tablet 0   ascorbic acid (VITAMIN C) 500 MG tablet Take 1 tablet (500 mg total) by mouth 2 (two) times daily. 60 tablet 3   aspirin 81 MG EC tablet Take 81 mg by mouth daily.      b complex vitamins tablet Take 1 tablet by mouth daily.      clopidogrel (PLAVIX) 75 MG tablet Take 1 tablet (75 mg total) by mouth daily. 30 tablet 6   dapagliflozin propanediol (FARXIGA) 10 MG TABS tablet Take 1 tablet (10 mg total) by mouth daily before breakfast. 30 tablet 6   Dulaglutide (TRULICITY) 1.5 MG/0.5ML SOPN Inject 1.5 mg into the skin once a week.     gabapentin (NEURONTIN) 100 MG capsule Take 1 capsule by mouth twice daily 60 capsule 0   insulin degludec (TRESIBA) 200 UNIT/ML FlexTouch Pen 32 Units at bedtime.     losartan (COZAAR) 25 MG tablet Take 1 tablet (25 mg total) by mouth daily. HOLD UNTIL FOLLOW-UP WITH YOUR DOCTOR     metFORMIN (GLUCOPHAGE) 500 MG tablet Take 2 tablets (1,000 mg total) by mouth 2 (two) times daily. 360 tablet 2   metoprolol succinate (TOPROL-XL) 50 MG 24 hr tablet TAKE 1 TABLET BY MOUTH DAILY. TAKE WITH OR IMMEDIATELY FOLLOWING A MEAL. 90 tablet 0   Omega-3 Fatty Acids (FISH OIL) 875 MG CAPS Take 875 mg by mouth 2 (two) times daily.     pramipexole (MIRAPEX) 1.5 MG tablet Take 1 tablet (1.5 mg total) by mouth 2 (two) times  daily. 180 tablet 3   rosuvastatin (CRESTOR) 20 MG tablet Take 1 tablet (20 mg total) by mouth daily. 90 tablet 0   spironolactone (ALDACTONE) 25 MG tablet Take 0.5 tablets (12.5 mg total) by mouth daily. 45 tablet 3   metolazone (ZAROXOLYN) 5 MG tablet Take 1 tablet (5 mg total) by mouth once a  week. On Monday, hold if wt 215 lbs or less (Patient not taking: Reported on 12/30/2022) 90 tablet 0   torsemide (DEMADEX) 20 MG tablet Take 60mg  in the morning and 40mg  in the evening     No current facility-administered medications for this visit.   Wt Readings from Last 3 Encounters:  12/30/22 217 lb 12.8 oz (98.8 kg)  12/17/22 219 lb 12.8 oz (99.7 kg)  12/16/22 222 lb (100.7 kg)     BP (!) 142/78   Pulse 72   Wt 217 lb 12.8 oz (98.8 kg)   SpO2 100%   BMI 33.12 kg/m  General: NAD Neck: JVP 8-9 cm with HJR, no thyromegaly or thyroid nodule.  Lungs: Clear to auscultation bilaterally with normal respiratory effort. CV: Nondisplaced PMI.  Heart regular S1/S2, no S3/S4, no murmur.  1+ edema 1/2 to knees bilaterally.  No carotid bruit.  Difficult to palpate pedal pulses.  Abdomen: Soft, nontender, no hepatosplenomegaly, mild distention.  Skin: Intact without lesions or rashes.  Neurologic: Alert and oriented x 3.  Psych: Normal affect. Extremities: No clubbing or cyanosis.  HEENT: Normal.   Assessment/Plan: 1. Chronic HF with mid range EF: Ischemic cardiomyopathy.  Echo in 2/24 with EF 45-50%, normal RV, s/p MV repair with no MR and mean gradient 4 mmHg, IVC normal.  He is still volume overloaded with NYHA class III symptoms. Challenging to balance euvolemia with renal function.  - Increase torsemide to 60 mg qam/40 mg qpm. BMET/BNP today and BMET in 1 week.  - No metolazone.  - Start on spironolactone 12.5 daily.  - Continue Farxiga 10 daily.  - Continue losartan 25 mg daily. He did not tolerate Entresto in the past per report.  - Continue Toprol XL 50 mg daily.  2. CAD: Late presentation  inferior MI in 6/19 with CABG x 3 at [facility].  No chest pain.  - Continue ASA 81 and Plavix 75 daily for now.  Could consider dropping ASA in the future.  - Continue statin, good lipids in 5/24.  3. Atrial fibrillation: Only noted post-op after CABG in 2019.  No palpitations.  - Anticoagulate if atrial fibrillation is noted to recur.  4. SVT/atrial tachycardia: Patient is followed by EP for this.  He has been on amiodarone to suppress.  - Continue amiodarone. Recent LFTs and TSH were normal.  He will need regular eye exam.  5. PAD: Had PTCAs to below the knee arteries on left in 11/23.  In 1/24, he had left transmetatarsal amputation. He is a nonsmoker.  - Followed by vascular surgery.  - Continue ASA, Plavix, statin. Last LDL at goal 52 (5/24)  6. CKD 3A-3B: Follow BMET carefully. Baseline Scr 1.4-1.7 but up to 2.1 recently.  - check labs today - Follows with Nephrology 7. Mitral regurgitation: S/p MV repair with CABG.  Valve was stable on last echo.   Followup 2-3 weeks.   Marca Ancona 12/30/2022

## 2022-12-30 NOTE — Patient Instructions (Signed)
INCREASE Toremide to 60mg  in the AM and 40mg  in the PM  START Spironolactone 12.5mg  daily  Routine lab work today. Will notify you of abnormal results  Repeat labs in 1 week  Follow up in 2-3 weeks  Do the following things EVERYDAY: Weigh yourself in the morning before breakfast. Write it down and keep it in a log. Take your medicines as prescribed Eat low salt foods--Limit salt (sodium) to 2000 mg per day.  Stay as active as you can everyday Limit all fluids for the day to less than 2 liters

## 2023-01-07 ENCOUNTER — Ambulatory Visit: Payer: 59 | Attending: Cardiology | Admitting: Cardiology

## 2023-01-07 ENCOUNTER — Encounter: Payer: Self-pay | Admitting: Pulmonary Disease

## 2023-01-07 ENCOUNTER — Encounter: Payer: Self-pay | Admitting: Cardiology

## 2023-01-07 VITALS — BP 108/62 | HR 76 | Ht 68.0 in | Wt 211.8 lb

## 2023-01-07 DIAGNOSIS — I471 Supraventricular tachycardia, unspecified: Secondary | ICD-10-CM | POA: Diagnosis not present

## 2023-01-07 DIAGNOSIS — Z951 Presence of aortocoronary bypass graft: Secondary | ICD-10-CM | POA: Diagnosis not present

## 2023-01-07 DIAGNOSIS — I5020 Unspecified systolic (congestive) heart failure: Secondary | ICD-10-CM | POA: Diagnosis not present

## 2023-01-07 DIAGNOSIS — I10 Essential (primary) hypertension: Secondary | ICD-10-CM | POA: Diagnosis not present

## 2023-01-07 LAB — MICROALBUMIN / CREATININE URINE RATIO: Microalb Creat Ratio: 24

## 2023-01-07 LAB — PROTEIN / CREATININE RATIO, URINE: Creatinine, Urine: 90

## 2023-01-07 LAB — MICROALBUMIN, URINE: Microalb, Ur: 2.2

## 2023-01-07 NOTE — Progress Notes (Signed)
Cardiology Office Note:    Date:  01/07/2023   ID:  Ricardo Brown, DOB 09-05-1960, MRN [id number]  PCP:  Jacky Kindle, FNP  Cardiologist:  Debbe Odea, MD  Electrophysiologist:  Lanier Prude, MD   Referring MD: Corky Downs, MD   Chief Complaint  Patient presents with   Follow-up    Patient denies new or acute cardiac problems/concerns today.     History of Present Illness:    Ricardo Brown is a 62 y.o. male with a hx of SVT on amio , CAD/CABG x3 in 2019, HFrEF (initial EF 40-45%, last EF 45-50%), hypertension, hyperlipidemia, diabetes, CKD-3 who presents for follow-up.   States feeling much better since last visit, followed up at the advanced heart failure clinic, diuretics were adjusted, currently on torsemide 60 mg in a.m., 40 mg in p.m.  Aldactone 12.5 mg daily also added.  Edema is adequately controlled, breathing is much better.  Denies chest pain, denies palpitations.  Has appointment with heart failure clinic in about 2 to 3 weeks.  Prior notes Echo 12/2021 EF 45 to 50% Echo 10/2020 EF 40 to 45%, grade 2 diastolic dysfunction. Patient was seen in the hospital/[facility] in 06/ 2019 due to nausea, weakness, myalgias.  EKG at the time showed inferior ST elevation with reciprocal changes in the anterior leads and small inferior Q waves.  Patient was deemed late presenting STEMI.   Left heart cath was performed, briefly showed occluded distal RCA, 90% mid LAD, 90% proximal left circumflex.  Due to patient being a diabetic, and left heart cath showing diffuse three-vessel disease, CABG was recommended.  He underwent coronary artery bypass grafting x3 in July 2019 at [facility]. Aldactone caused worsening renal dysfunction, this was stopped.  Sherryll Burger previously tried on patient, he did not tolerate.  He developed feeling of unwell, went into SVT and was taken to the emergency room.  Patient with history of SVT, who was seen in the emergency room and  adenosine given x2 and placed on amiodarone  Echo on 09/2019 showed mild to moderately reduced ejection fraction, EF 40 to 45%.  Past Medical History:  Diagnosis Date   Chronic combined systolic (congestive) and diastolic (congestive) heart failure (HCC)    a. 01/2018 EF 35-40%; b. 10/2020 Echo: EF 40-45%, glob HK. RVSP 42.29mmHg. Mildly dil LA. Mild MS/AoV sclerosis.   CKD (chronic kidney disease), stage Brown (HCC)    Coronary artery disease    a. 01/2018 late presenting inferior MI; b. 01/2018 Cath: Severe multivessel dzs-->CABG x 3 (LIMA->LAD, VG->OM2, VG->RPDA @ [facility]).   Diabetes mellitus without complication (HCC)    a. Dx ~ 2000.   Heart palpitations    a. Pt reports prior nl echo's and stress tests. Last stress test w/in past 2 yrs - PCP.   High cholesterol    Hypertension    Mitral regurgitation    a. 01/2018 s/p MV repair @ time of CABG.   Myocardial infarction [facility])    Neuropathy    legs and hands   Postoperative atrial fibrillation    a. 01/2018 @ time of CABG.   PSVT (paroxysmal supraventricular tachycardia)    a. Very symptomatic with multiple ED evaluations.  Has been on amio.   Severe sepsis with acute organ dysfunction (HCC) 12/19/2021   Sleep apnea    "mild - has CPAP, doesn't use   Sleep difficulties 09/07/2019   STEMI (ST elevation myocardial infarction) (HCC) 01/24/2018   Wears dentures  full upper and lower    Past Surgical History:  Procedure Laterality Date   AMPUTATION TOE Right 08/29/2021   Procedure: AMPUTATION TOE-2nd toe;  Surgeon: Linus Galas, DPM;  Location: [facility] ORS;  Service: Podiatry;  Laterality: Right;   AMPUTATION TOE Left 12/22/2021   Procedure: AMPUTATION TOE-2nd Toe Partial;  Surgeon: Rosetta Posner, DPM;  Location: [facility] ORS;  Service: Podiatry;  Laterality: Left;   CARDIAC CATHETERIZATION     CARDIAC VALVE REPLACEMENT     Mitral Valve Repair   CATARACT EXTRACTION W/PHACO Left 10/08/2022   Procedure: CATARACT EXTRACTION PHACO AND INTRAOCULAR  LENS PLACEMENT (IOC) LEFT DIABETIC OMIDRIA 20.94 01:35.9;  Surgeon: Estanislado Pandy, MD;  Location: [facility];  Service: Ophthalmology;  Laterality: Left;  Diabetic   COLONOSCOPY WITH PROPOFOL N/A 06/07/2019   Procedure: COLONOSCOPY WITH PROPOFOL;  Surgeon: Pasty Spillers, MD;  Location: [facility] END[facility]COPY;  Service: Endoscopy;  Laterality: N/A;   COLONOSCOPY WITH PROPOFOL N/A 02/07/2020   Procedure: COLONOSCOPY WITH PROPOFOL;  Surgeon: Pasty Spillers, MD;  Location: [facility] ENDOSCOPY;  Service: Endoscopy;  Laterality: N/A;   COLONOSCOPY WITH PROPOFOL N/A 12/11/2020   Procedure: COLONOSCOPY WITH PROPOFOL;  Surgeon: Pasty Spillers, MD;  Location: [facility] ENDOSCOPY;  Service: Endoscopy;  Laterality: N/A;   CORONARY ANGIOPLASTY     CORONARY ARTERY BYPASS GRAFT     x 3   01/2018   ESOPHAGOGASTRODUODENOSCOPY N/A 02/07/2020   Procedure: ESOPHAGOGASTRODUODENOSCOPY (EGD);  Surgeon: Pasty Spillers, MD;  Location: [facility] ENDOSCOPY;  Service: Endoscopy;  Laterality: N/A;   INCISION AND DRAINAGE Left 06/05/2022   Procedure: INCISION AND DRAINAGE;  Surgeon: Rosetta Posner, DPM;  Location: [facility];  Service: Podiatry;  Laterality: Left;   LEFT HEART CATH AND CORONARY ANGIOGRAPHY N/A 01/24/2018   Procedure: LEFT HEART CATH AND CORONARY ANGIOGRAPHY;  Surgeon: Iran Ouch, MD;  Location: [facility] INVASIVE CV LAB;  Service: Cardiovascular;  Laterality: N/A;   LOWER EXTREMITY ANGIOGRAPHY Left 06/08/2022   Procedure: Lower Extremity Angiography;  Surgeon: Annice Needy, MD;  Location: [facility] INVASIVE CV LAB;  Service: Cardiovascular;  Laterality: Left;   SVT ABLATION N/A 09/12/2021   Procedure: SVT ABLATION;  Surgeon: Lanier Prude, MD;  Location: [facility] INVASIVE CV LAB;  Service: Cardiovascular;  Laterality: N/A;   TONSILLECTOMY     TRANSMETATARSAL AMPUTATION Left 08/04/2022   Procedure: TRANSMETATARSAL AMPUTATION;  Surgeon: Linus Galas, DPM;  Location: [facility];  Service: Podiatry;  Laterality:  Left;    Current Medications: Current Meds  Medication Sig   amiodarone (PACERONE) 200 MG tablet Take 1 tablet (200 mg total) by mouth daily.   ascorbic acid (VITAMIN C) 500 MG tablet Take 1 tablet (500 mg total) by mouth 2 (two) times daily.   aspirin 81 MG EC tablet Take 81 mg by mouth daily.    b complex vitamins tablet Take 1 tablet by mouth daily.    clopidogrel (PLAVIX) 75 MG tablet Take 1 tablet (75 mg total) by mouth daily.   dapagliflozin propanediol (FARXIGA) 10 MG TABS tablet Take 1 tablet (10 mg total) by mouth daily before breakfast.   Dulaglutide (TRULICITY) 1.5 MG/0.5ML SOPN Inject 1.5 mg into the skin once a week.   gabapentin (NEURONTIN) 100 MG capsule Take 1 capsule by mouth twice daily   insulin degludec (TRESIBA) 200 UNIT/ML FlexTouch Pen 32 Units at bedtime.   losartan (COZAAR) 25 MG tablet Take 1 tablet (25 mg total) by mouth daily. HOLD UNTIL FOLLOW-UP WITH YOUR DOCTOR   metFORMIN (GLUCOPHAGE)  500 MG tablet Take 2 tablets (1,000 mg total) by mouth 2 (two) times daily.   metoprolol succinate (TOPROL-XL) 50 MG 24 hr tablet TAKE 1 TABLET BY MOUTH DAILY. TAKE WITH OR IMMEDIATELY FOLLOWING A MEAL.   Omega-3 Fatty Acids (FISH OIL) 875 MG CAPS Take 875 mg by mouth 2 (two) times daily.   pramipexole (MIRAPEX) 1.5 MG tablet Take 1 tablet (1.5 mg total) by mouth 2 (two) times daily.   rosuvastatin (CRESTOR) 20 MG tablet Take 1 tablet (20 mg total) by mouth daily.   spironolactone (ALDACTONE) 25 MG tablet Take 0.5 tablets (12.5 mg total) by mouth daily.   torsemide (DEMADEX) 20 MG tablet Take 60mg  in the morning and 40mg  in the evening     Allergies:   Lipitor [atorvastatin], Sacubitril-valsartan, and Pregabalin   Social History   Socioeconomic History   Marital status: Married    Spouse name: Not on file   Number of children: Not on file   Years of education: Not on file   Highest education level: Not on file  Occupational History   Not on file  Tobacco Use    Smoking status: Never   Smokeless tobacco: Never  Vaping Use   Vaping Use: Never used  Substance and Sexual Activity   Alcohol use: Not Currently    Comment: rare beer   Drug use: No   Sexual activity: Not on file  Other Topics Concern   Not on file  Social History Narrative   Lives locally with wife.  Works in [employer].  Does not routinely exercise.   Social Determinants of Health   Financial Resource Strain: Not on file  Food Insecurity: No Food Insecurity (07/31/2022)   Hunger Vital Sign    Worried About Running Out of Food in the Last Year: Never true    Ran Out of Food in the Last Year: Never true  Recent Concern: Food Insecurity - Food Insecurity Present (06/02/2022)   Hunger Vital Sign    Worried About Running Out of Food in the Last Year: Sometimes true    Ran Out of Food in the Last Year: Sometimes true  Transportation Needs: No Transportation Needs (07/31/2022)   PRAPARE - Administrator, Civil Service (Medical): No    Lack of Transportation (Non-Medical): No  Physical Activity: Not on file  Stress: Not on file  Social Connections: Not on file     Family History: The patient's family history includes CAD in his father; Cancer in his father and mother; Other in his brother.  ROS:   Please see the history of present illness.     All other systems reviewed and are negative.  EKGs/Labs/Other Studies Reviewed:    The following studies were reviewed today:   EKG:  EKG not ordered today.   Recent Labs: 05/27/2022: Magnesium 2.2 08/10/2022: Hemoglobin 8.5; Platelets 253 12/09/2022: ALT 20; TSH 4.261 12/30/2022: B Natriuretic Peptide 81.5; BUN 39; Creatinine, Ser 1.66; Potassium 4.1; Sodium 139  Recent Lipid Panel    Component Value Date/Time   CHOL 109 12/09/2022 1210   TRIG 147 12/09/2022 1210   HDL 28 (L) 12/09/2022 1210   CHOLHDL 3.9 12/09/2022 1210   VLDL 29 12/09/2022 1210   LDLCALC 52 12/09/2022 1210   LDLCALC 67 09/30/2020 1114     Physical Exam:    VS:  BP 108/62 (BP Location: Right Arm, Patient Position: Sitting, Cuff Size: Normal)   Pulse 76   Ht 5\' 8"  (1.727 m)  Wt 211 lb 12.8 oz (96.1 kg)   SpO2 98%   BMI 32.20 kg/m     Wt Readings from Last 3 Encounters:  01/07/23 211 lb 12.8 oz (96.1 kg)  12/30/22 217 lb 12.8 oz (98.8 kg)  12/17/22 219 lb 12.8 oz (99.7 kg)     GEN:  Well nourished, well developed in no acute distress HEENT: Normal NECK: No JVD; No carotid bruits CARDIAC: RRR, no murmurs, rubs, gallops RESPIRATORY:  Clear to auscultation without rales, wheezing or rhonchi  ABDOMEN: Soft, non-tender, distended MUSCULOSKELETAL:  1+ edema; No deformity  SKIN: Warm and dry NEUROLOGIC:  Alert and oriented x 3 PSYCHIATRIC:  Normal affect   ASSESSMENT:    1. Hx of CABG   2. HFrEF (heart failure with reduced ejection fraction) (HCC)   3. Primary hypertension   4. SVT (supraventricular tachycardia)     PLAN:    In order of problems listed above:  CAD s/p CABG x3 in 2019. Denies chest pain.  Continue aspirin and Crestor 20 mg daily. ICM, echo 2/24 EF 45 - 50%, unchanged from prior. describes NYHA class II-Brown symptoms, trace leg edema.appreciate input from heart failure service. Continue losartan 25 mg daily, Toprol-XL 50 mg daily, Aldactone 12.5 mg daily, torsemide 60 in a.m., 40 in p.m, Jardiance, keep appointments with heart failure clinic. did not tolerate Entresto in the past, went into SVT.  Hypertension, BP controlled.  Continue Toprol-XL, losartan, Aldactone.   SVT, currently in sinus rhythm on amiodarone.  Continue amiodarone 200 mg daily.  Follow-up in 6 months.  Total encounter time 40 minutes  Greater than 50% was spent in counseling and coordination of care with the patient   Medication Adjustments/Labs and Tests Ordered: Current medicines are reviewed at length with the patient today.  Concerns regarding medicines are outlined above.  No orders of the defined types were  placed in this encounter.    No orders of the defined types were placed in this encounter.     Patient Instructions  Medication Instructions:   Your physician recommends that you continue on your current medications as directed. Please refer to the Current Medication list given to you today.  *If you need a refill on your cardiac medications before your next appointment, please call your pharmacy*   Lab Work:  None Ordered  If you have labs (blood work) drawn today and your tests are completely normal, you will receive your results only by: MyChart Message (if you have MyChart) OR A paper copy in the mail If you have any lab test that is abnormal or we need to change your treatment, we will call you to review the results.   Testing/Procedures:  None Ordered   Follow-Up: At [facility], you and your health needs are our priority.  As part of our continuing mission to provide you with exceptional heart care, we have created designated Provider Care Teams.  These Care Teams include your primary Cardiologist (physician) and Advanced Practice Providers (APPs -  Physician Assistants and Nurse Practitioners) who all work together to provide you with the care you need, when you need it.  We recommend signing up for the patient portal called "MyChart".  Sign up information is provided on this After Visit Summary.  MyChart is used to connect with patients for Virtual Visits (Telemedicine).  Patients are able to view lab/test results, encounter notes, upcoming appointments, etc.  Non-urgent messages can be sent to your provider as well.   To learn more  about what you can do with MyChart, go to [email or web address].    Your next appointment:   6 month(s)  Provider:   You may see Debbe Odea, MD or one of the following Advanced Practice Providers on your designated Care Team:   Nicolasa Ducking, NP Eula Listen, PA-C Cadence Fransico Michael, PA-C Charlsie Quest, NP          Signed, Debbe Odea, MD  01/07/2023 12:15 PM    [facility]

## 2023-01-07 NOTE — Patient Instructions (Addendum)
Medication Instructions:   Your physician recommends that you continue on your current medications as directed. Please refer to the Current Medication list given to you today.  *If you need a refill on your cardiac medications before your next appointment, please call your pharmacy*   Lab Work:  None Ordered  If you have labs (blood work) drawn today and your tests are completely normal, you will receive your results only by: MyChart Message (if you have MyChart) OR A paper copy in the mail If you have any lab test that is abnormal or we need to change your treatment, we will call you to review the results.   Testing/Procedures:  None Ordered   Follow-Up: At [facility], you and your health needs are our priority.  As part of our continuing mission to provide you with exceptional heart care, we have created designated Provider Care Teams.  These Care Teams include your primary Cardiologist (physician) and Advanced Practice Providers (APPs -  Physician Assistants and Nurse Practitioners) who all work together to provide you with the care you need, when you need it.  We recommend signing up for the patient portal called "MyChart".  Sign up information is provided on this After Visit Summary.  MyChart is used to connect with patients for Virtual Visits (Telemedicine).  Patients are able to view lab/test results, encounter notes, upcoming appointments, etc.  Non-urgent messages can be sent to your provider as well.   To learn more about what you can do with MyChart, go to [email or web address].    Your next appointment:   6 month(s)  Provider:   You may see Brian Agbor-Etang, MD or one of the following Advanced Practice Providers on your designated Care Team:   Christopher Berge, NP Ryan Dunn, PA-C Cadence Furth, PA-C Sheri Hammock, NP 

## 2023-01-12 ENCOUNTER — Other Ambulatory Visit: Payer: Self-pay | Admitting: Family Medicine

## 2023-01-12 DIAGNOSIS — G2581 Restless legs syndrome: Secondary | ICD-10-CM

## 2023-01-12 NOTE — Telephone Encounter (Signed)
Medication Refill - Medication: pramipexole (MIRAPEX) 1.5 MG tablet   Has the patient contacted their pharmacy? No because was prescribed by a different Dr he doesn't see anymore (Agent: If no, request that the patient contact the pharmacy for the refill. If patient does not wish to contact the pharmacy document the reason why and proceed with request.) (Agent: If yes, when and what did the pharmacy advise?)pt called directly in  Preferred Pharmacy (with phone number or street name):  [facility] [address] [place], [place] - [address] Phone: [phone]  Fax: [phone]     Has the patient been seen for an appointment in the last year OR does the patient have an upcoming appointment? yes  Agent: Please be advised that RX refills may take up to 3 business days. We ask that you follow-up with your pharmacy.

## 2023-01-13 MED ORDER — PRAMIPEXOLE DIHYDROCHLORIDE 1.5 MG PO TABS: 1.5000 mg | ORAL_TABLET | Freq: Two times a day (BID) | ORAL | 3 refills | Status: DC

## 2023-01-13 NOTE — Telephone Encounter (Signed)
Requested medication (s) are due for refill today: yes  Requested medication (s) are on the active medication list: yes  Last refill:  05/21/21  Future visit scheduled: yes  Notes to clinic:  Unable to refill per protocol, last refill by another provider not at this practice, routing for approval.     Requested Prescriptions  Pending Prescriptions Disp Refills   pramipexole (MIRAPEX) 1.5 MG tablet 180 tablet 3    Sig: Take 1 tablet (1.5 mg total) by mouth 2 (two) times daily.     Neurology:  Parkinsonian Agents Passed - 01/12/2023 10:23 AM      Passed - Last BP in normal range    BP Readings from Last 1 Encounters:  01/07/23 108/62         Passed - Last Heart Rate in normal range    Pulse Readings from Last 1 Encounters:  01/07/23 76         Passed - Valid encounter within last 12 months    Recent Outpatient Visits           4 weeks ago Pulmonary edema with congestive heart failure with reduced left ventricular function [facility])   [place] [facility] Jacky Kindle, FNP       Future Appointments             In 5 months Agbor-Etang, Arlys John, MD [facility]

## 2023-01-19 ENCOUNTER — Encounter: Payer: Self-pay | Admitting: Family Medicine

## 2023-01-19 ENCOUNTER — Ambulatory Visit (INDEPENDENT_AMBULATORY_CARE_PROVIDER_SITE_OTHER): Payer: 59 | Admitting: Family Medicine

## 2023-01-19 VITALS — BP 137/64 | HR 82 | Ht 68.0 in | Wt 214.0 lb

## 2023-01-19 DIAGNOSIS — Z1159 Encounter for screening for other viral diseases: Secondary | ICD-10-CM | POA: Diagnosis not present

## 2023-01-19 DIAGNOSIS — Z0000 Encounter for general adult medical examination without abnormal findings: Secondary | ICD-10-CM | POA: Diagnosis not present

## 2023-01-19 NOTE — Progress Notes (Unsigned)
Subjective:   Ricardo Brown is a 62 y.o. male who presents for an Initial Medicare Annual Wellness Visit.  I connected with  Ricardo Brown on 01/19/23 by a  in office visit  enabled telemedicine application and verified that I am speaking with the correct person using two identifiers.  Patient Location: Other:  [facility] [employer] Location: Other:  [facility]  Patient Medicare AWV questionnaire was completed by the patient on 01/19/2023; I have confirmed that all information answered by patient is correct and no changes since this date.  Review of Systems     Endorses itching to R temple; no obvious irritation or lesion. Continue to monitor.     Objective:    Today's Vitals   01/19/23 0901 01/19/23 0908  BP: (!) 144/78 137/64  Pulse: 70 82  SpO2: 98%   Weight: 214 lb (97.1 kg)   Height: 5\' 8"  (1.727 m)    Body mass index is 32.54 kg/m.     10/08/2022   10:49 AM 08/04/2022   11:41 AM 07/30/2022    5:11 PM 06/08/2022    3:43 PM 06/05/2022    4:56 PM 06/02/2022    1:00 PM 06/02/2022    9:10 AM  Advanced Directives  Does Patient Have a Medical Advance Directive? No No No No No No No  Would patient like information on creating a medical advance directive? No - Patient declined  No - Patient declined  No - Patient declined No - Patient declined     Current Medications (verified) Outpatient Encounter Medications as of 01/19/2023  Medication Sig   amiodarone (PACERONE) 200 MG tablet Take 1 tablet (200 mg total) by mouth daily.   ascorbic acid (VITAMIN C) 500 MG tablet Take 1 tablet (500 mg total) by mouth 2 (two) times daily.   aspirin 81 MG EC tablet Take 81 mg by mouth daily.    b complex vitamins tablet Take 1 tablet by mouth daily.    clopidogrel (PLAVIX) 75 MG tablet Take 1 tablet (75 mg total) by mouth daily.   dapagliflozin propanediol (FARXIGA) 10 MG TABS tablet Take 1 tablet (10 mg total) by mouth daily before  breakfast.   Dulaglutide (TRULICITY) 1.5 MG/0.5ML SOPN Inject 1.5 mg into the skin once a week.   gabapentin (NEURONTIN) 100 MG capsule Take 1 capsule by mouth twice daily   insulin degludec (TRESIBA) 200 UNIT/ML FlexTouch Pen 32 Units at bedtime.   losartan (COZAAR) 25 MG tablet Take 1 tablet (25 mg total) by mouth daily. HOLD UNTIL FOLLOW-UP WITH YOUR DOCTOR   metFORMIN (GLUCOPHAGE) 500 MG tablet Take 2 tablets (1,000 mg total) by mouth 2 (two) times daily.   metolazone (ZAROXOLYN) 5 MG tablet Take 1 tablet (5 mg total) by mouth once a week. On Monday, hold if wt 215 lbs or less   metoprolol succinate (TOPROL-XL) 50 MG 24 hr tablet TAKE 1 TABLET BY MOUTH DAILY. TAKE WITH OR IMMEDIATELY FOLLOWING A MEAL.   Omega-3 Fatty Acids (FISH OIL) 875 MG CAPS Take 875 mg by mouth 2 (two) times daily.   pramipexole (MIRAPEX) 1.5 MG tablet Take 1 tablet (1.5 mg total) by mouth 2 (two) times daily.   rosuvastatin (CRESTOR) 20 MG tablet Take 1 tablet (20 mg total) by mouth daily.   spironolactone (ALDACTONE) 25 MG tablet Take 0.5 tablets (12.5 mg total) by mouth daily.   torsemide (DEMADEX) 20 MG tablet Take 60mg  in the morning and 40mg  in  the evening   No facility-administered encounter medications on file as of 01/19/2023.    Allergies (verified) Lipitor [atorvastatin], Sacubitril-valsartan, and Pregabalin   History: Past Medical History:  Diagnosis Date   Chronic combined systolic (congestive) and diastolic (congestive) heart failure (HCC)    a. 01/2018 EF 35-40%; b. 10/2020 Echo: EF 40-45%, glob HK. RVSP 42.66mmHg. Mildly dil LA. Mild MS/AoV sclerosis.   CKD (chronic kidney disease), stage Brown (HCC)    Coronary artery disease    a. 01/2018 late presenting inferior MI; b. 01/2018 Cath: Severe multivessel dzs-->CABG x 3 (LIMA->LAD, VG->OM2, VG->RPDA @ [facility]).   Diabetes mellitus without complication (HCC)    a. Dx ~ 2000.   Heart palpitations    a. Pt reports prior nl echo's and stress tests. Last stress  test w/in past 2 yrs - PCP.   High cholesterol    Hypertension    Mitral regurgitation    a. 01/2018 s/p MV repair @ time of CABG.   Myocardial infarction [facility])    Neuropathy    legs and hands   Postoperative atrial fibrillation    a. 01/2018 @ time of CABG.   PSVT (paroxysmal supraventricular tachycardia)    a. Very symptomatic with multiple ED evaluations.  Has been on amio.   Severe sepsis with acute organ dysfunction (HCC) 12/19/2021   Sleep apnea    "mild - has CPAP, doesn't use   Sleep difficulties 09/07/2019   STEMI (ST elevation myocardial infarction) (HCC) 01/24/2018   Wears dentures    full upper and lower   Past Surgical History:  Procedure Laterality Date   AMPUTATION TOE Right 08/29/2021   Procedure: AMPUTATION TOE-2nd toe;  Surgeon: Linus Galas, DPM;  Location: [facility] ORS;  Service: Podiatry;  Laterality: Right;   AMPUTATION TOE Left 12/22/2021   Procedure: AMPUTATION TOE-2nd Toe Partial;  Surgeon: Rosetta Posner, DPM;  Location: [facility] OR[facility];  Service: Podiatry;  Laterality: Left;   CARDIAC CATHETERIZATION     CARDIAC VALVE REPLACEMENT     Mitral Valve Repair   CATARACT EXTRACTION W/PHACO Left 10/08/2022   Procedure: CATARACT EXTRACTION PHACO AND INTRAOCULAR LENS PLACEMENT (IOC) LEFT DIABETIC OMIDRIA 20.94 01:35.9;  Surgeon: Estanislado Pandy, MD;  Location: [facility];  Service: Ophthalmology;  Laterality: Left;  Diabetic   COLONOSCOPY WITH PROPOFOL N/A 06/07/2019   Procedure: COLONOSCOPY WITH PROPOFOL;  Surgeon: Pasty Spillers, MD;  Location: [facility] ENDOSCOPY;  Service: Endoscopy;  Laterality: N/A;   COLONOSCOPY WITH PROPOFOL N/A 02/07/2020   Procedure: COLONOSCOPY WITH PROPOFOL;  Surgeon: Pasty Spillers, MD;  Location: [facility] ENDOSCOPY;  Service: Endoscopy;  Laterality: N/A;   COLONOSCOPY WITH PROPOFOL N/A 12/11/2020   Procedure: COLONOSCOPY WITH PROPOFOL;  Surgeon: Pasty Spillers, MD;  Location: [facility] ENDOSCOPY;  Service: Endoscopy;  Laterality: N/A;    CORONARY ANGIOPLASTY     CORONARY ARTERY BYPASS GRAFT     x 3   01/2018   ESOPHAGOGASTRODUODENOSCOPY N/A 02/07/2020   Procedure: ESOPHAGOGASTRODUODENOSCOPY (EGD);  Surgeon: Pasty Spillers, MD;  Location: [facility] ENDOSCOPY;  Service: Endoscopy;  Laterality: N/A;   INCISION AND DRAINAGE Left 06/05/2022   Procedure: INCISION AND DRAINAGE;  Surgeon: Rosetta Posner, DPM;  Location: [facility];  Service: Podiatry;  Laterality: Left;   LEFT HEART CATH AND CORONARY ANGIOGRAPHY N/A 01/24/2018   Procedure: LEFT HEART CATH AND CORONARY ANGIOGRAPHY;  Surgeon: Iran Ouch, MD;  Location: [facility] INVASIVE CV LAB;  Service: Cardiovascular;  Laterality: N/A;   LOWER EXTREMITY ANGIOGRAPHY Left 06/08/2022   Procedure:  Lower Extremity Angiography;  Surgeon: Annice Needy, MD;  Location: [facility] INVASIVE CV LAB;  Service: Cardiovascular;  Laterality: Left;   SVT ABLATION N/A 09/12/2021   Procedure: SVT ABLATION;  Surgeon: Lanier Prude, MD;  Location: [facility] INVASIVE CV LAB;  Service: Cardiovascular;  Laterality: N/A;   TONSILLECTOMY     TRANSMETATARSAL AMPUTATION Left 08/04/2022   Procedure: TRANSMETATARSAL AMPUTATION;  Surgeon: Linus Galas, DPM;  Location: [facility];  Service: Podiatry;  Laterality: Left;   Family History  Problem Relation Age of Onset   Cancer Mother        died @ 42   CAD Father        First MI @ 36. S/p heart transplant. Died in mid-50's of cancer.   Cancer Father    Other Brother        alive and well   Social History   Socioeconomic History   Marital status: Married    Spouse name: Not on file   Number of children: Not on file   Years of education: Not on file   Highest education level: Not on file  Occupational History   Not on file  Tobacco Use   Smoking status: Never   Smokeless tobacco: Never  Vaping Use   Vaping Use: Never used  Substance and Sexual Activity   Alcohol use: Yes    Comment: rare beer   Drug use: No   Sexual activity: Not on file  Other Topics Concern    Not on file  Social History Narrative   Lives locally with wife.  Works in [employer].  Does not routinely exercise.   Social Determinants of Health   Financial Resource Strain: Not on file  Food Insecurity: No Food Insecurity (07/31/2022)   Hunger Vital Sign    Worried About Running Out of Food in the Last Year: Never true    Ran Out of Food in the Last Year: Never true  Recent Concern: Food Insecurity - Food Insecurity Present (06/02/2022)   Hunger Vital Sign    Worried About Running Out of Food in the Last Year: Sometimes true    Ran Out of Food in the Last Year: Sometimes true  Transportation Needs: No Transportation Needs (07/31/2022)   PRAPARE - Administrator, Civil Service (Medical): No    Lack of Transportation (Non-Medical): No  Physical Activity: Not on file  Stress: Not on file  Social Connections: Not on file    Tobacco Counseling Counseling given: Not Answered  Activities of Daily Living    01/19/2023    9:07 AM 12/16/2022    9:01 AM  In your present state of health, do you have any difficulty performing the following activities:  Hearing? 1 0  Vision? 1 0  Difficulty concentrating or making decisions? 1 0  Walking or climbing stairs? 1 0  Dressing or bathing? 0 0  Doing errands, shopping? 0 0    Patient Care Team: Jacky Kindle, FNP as PCP - General (Family Medicine) Debbe Odea, MD as PCP - Cardiology (Cardiology) Lanier Prude, MD as PCP - Electrophysiology (Cardiology) Lorain Childes, MD as Consulting Physician (Nephrology) Tedd Sias Marlana Salvage, MD as Physician Assistant (Endocrinology) Wyline Mood, MD as Consulting Physician (Gastroenterology) Nicholaus Corolla, MD as Referring Physician (Ophthalmology) Johnsie Kindred, DO (Optometry) Bensimhon, Bevelyn Buckles, MD as Consulting Physician (Cardiology)  Indicate any recent Medical Services you may have received from other than [facility] providers in the past year (date may be approximate).  Assessment:   This is a routine wellness examination for Ricardo Brown. Prefers the name of "Genevie Cheshire"  Hearing/Vision screen [facility] 01/04/2023  Dietary issues and exercise activities discussed:   Following dietary recommendation from kidney specialist- potassium and phosphorus, fluid management. Body mass index is 32.54 kg/m.   Goals Addressed   None    Depression Screen    01/19/2023    9:07 AM 12/16/2022    9:01 AM 08/20/2022   10:10 AM 09/10/2021   12:01 PM 04/21/2021    9:33 AM 09/30/2020   10:38 AM 09/01/2019    9:15 AM  PHQ 2/9 Scores  PHQ - 2 Score 0 0 0 0 0 0 0  PHQ- 9 Score 3 0         Fall Risk    01/19/2023    9:06 AM 12/16/2022    9:01 AM 08/20/2022   10:10 AM 11/05/2021    8:49 AM 09/10/2021   12:01 PM  Fall Risk   Falls in the past year? 0 0 0 0 0  Number falls in past yr: 0 0 0 0 0  Injury with Fall? 0 0 0 0 0  Risk for fall due to :    No Fall Risks No Fall Risks  Follow up    Falls evaluation completed Falls evaluation completed    MEDICARE RISK AT HOME: denies concern Lives with pets Daughter next door Lives with wife Denies falls, self harm, harm of others  TIMED UP AND GO:  Was the test performed? No    Cognitive Function:       01/19/2023    9:41 AM  MMSE - Mini Mental State Exam  Orientation to time 5  Orientation to Place 5  Registration 3  Attention/ Calculation 5  Recall 3  Language- name 2 objects 2  Language- repeat 1  Language- follow 3 step command 3  Language- read & follow direction 1  Write a sentence 1  Copy design 1  Total score 30   Immunizations  There is no immunization history on file for this patient.  TDAP status: Due, Education has been provided regarding the importance of this vaccine. Advised may receive this vaccine at local pharmacy or Health Dept. Aware to provide a copy of the vaccination record if obtained from local pharmacy or Health Dept. Verbalized acceptance and understanding.  Flu Vaccine status:  Declined, Education has been provided regarding the importance of this vaccine but patient still declined. Advised may receive this vaccine at local pharmacy or Health Dept. Aware to provide a copy of the vaccination record if obtained from local pharmacy or Health Dept. Verbalized acceptance and understanding.  Pneumococcal vaccine status: Declined,  Education has been provided regarding the importance of this vaccine but patient still declined. Advised may receive this vaccine at local pharmacy or Health Dept. Aware to provide a copy of the vaccination record if obtained from local pharmacy or Health Dept. Verbalized acceptance and understanding.   Covid-19 vaccine status: Declined, Education has been provided regarding the importance of this vaccine but patient still declined. Advised may receive this vaccine at local pharmacy or Health Dept.or vaccine clinic. Aware to provide a copy of the vaccination record if obtained from local pharmacy or Health Dept. Verbalized acceptance and understanding.  Qualifies for Shingles Vaccine? No   Zostavax completed  declined   Shingrix Completed?: No.    Education has been provided regarding the importance of this vaccine. Patient has been advised to call insurance  company to determine out of pocket expense if they have not yet received this vaccine. Advised may also receive vaccine at local pharmacy or Health Dept. Verbalized acceptance and understanding.  Screening Tests Health Maintenance  Topic Date Due   Medicare Annual Wellness (AWV)  Never done   Diabetic kidney evaluation - Urine ACR  Never done   Hepatitis C Screening  Never done   DTaP/Tdap/Td (1 - Tdap) Never done   Zoster Vaccines- Shingrix (1 of 2) Never done   Colonoscopy  12/11/2021   HEMOGLOBIN A1C  11/26/2022   FOOT EXAM  03/31/2023   OPHTHALMOLOGY EXAM  07/15/2023   Diabetic kidney evaluation - eGFR measurement  12/30/2023   HIV Screening  Completed   HPV VACCINES  Aged Out    INFLUENZA VACCINE  Discontinued   COVID-19 Vaccine  Discontinued    Health Maintenance  Health Maintenance Due  Topic Date Due   Medicare Annual Wellness (AWV)  Never done   Diabetic kidney evaluation - Urine ACR  Never done   Hepatitis C Screening  Never done   DTaP/Tdap/Td (1 - Tdap) Never done   Zoster Vaccines- Shingrix (1 of 2) Never done   Colonoscopy  12/11/2021   HEMOGLOBIN A1C  11/26/2022    Colorectal cancer screening: Type of screening: Colonoscopy. Completed 12/2020. Repeat every 2 years  Lung Cancer Screening: (Low Dose CT Chest recommended if Age 19-80 years, 20 pack-year currently smoking OR have quit w/in 15years.) does not qualify.   Lung Cancer Screening Referral: n/a  Additional Screening:  Hepatitis C Screening: does qualify; order at next visit  Vision Screening: Recommended annual ophthalmology exams for early detection of glaucoma and other disorders of the eye. Is the patient up to date with their annual eye exam?  Yes  Who is the provider or what is the name of the office in which the patient attends annual eye exams? 01/03/2023 If pt is not established with a provider, would they like to be referred to a provider to establish care? No .   Dental Screening: Recommended annual dental exams for proper oral hygiene  Diabetic Foot Exam: Diabetic Foot Exam: Completed 01/31/2023  Community Resource Referral / Chronic Care Management: CRR required this visit?  No   CCM required this visit?  No    Plan:     I have personally reviewed and noted the following in the patient's chart:   Medical and social history Use of alcohol, tobacco or illicit drugs  Current medications and supplements including opioid prescriptions. Patient is not currently taking opioid prescriptions. Functional ability and status Nutritional status Physical activity Advanced directives List of other physicians Hospitalizations, surgeries, and ER visits in previous 12  months Vitals Screenings to include cognitive, depression, and falls Referrals and appointments  In addition, I have reviewed and discussed with patient certain preventive protocols, quality metrics, and best practice recommendations. A written personalized care plan for preventive services as well as general preventive health recommendations were provided to patient.   Leilani Merl, FNP, have reviewed all documentation for this visit. The documentation on 01/19/23 for the exam, diagnosis, procedures, and orders are all accurate and complete.   Jacky Kindle, FNP   01/19/2023   After Visit Summary: Attached  Encourage abstraction from labs at kidney specialist from [facility]

## 2023-01-20 DIAGNOSIS — Z1159 Encounter for screening for other viral diseases: Secondary | ICD-10-CM | POA: Insufficient documentation

## 2023-01-20 DIAGNOSIS — Z0000 Encounter for general adult medical examination without abnormal findings: Secondary | ICD-10-CM | POA: Insufficient documentation

## 2023-01-20 LAB — HEPATITIS C ANTIBODY: Hep C Virus Ab: NONREACTIVE

## 2023-01-20 NOTE — Progress Notes (Signed)
Normal/negative Hep C

## 2023-01-25 NOTE — Progress Notes (Deleted)
PCP: Merita Norton, FNP (last seen Primary Cardiologist: Debbe Odea, MD (last seen HF provider: Marca Ancona, MD (last seen  HPI:  Ricardo Brown is a 62 y.o. with history of CAD s/p CABG 06/19, ischemic cardiomyopathy, atrial tachycardia/ SVT, CKD, OSA, hyperlipidemia, DM, HTN, mitral regurgitation with valve repair 06/19 and PAD.  Patient had a delayed presentation inferior MI in 6/19. He has PAD with below-the-knee PTCAs on left in 11/23, then left TMA in 1/24.  He had atrial fibrillation only noted at the time of CABG (post-op) and is not anticoagulated.    Patient has struggled recently with volume overload.  His spironolactone was stopped with creatinine rise in 3/24.  He did not tolerate Entresto in the past. He saw Dr. Azucena Cecil on 12/04/22, he was on torsemide 60 mg daily at that time. He was started on metolazone 5 mg daily at that visit. Creatinine rose to > 2 and metolazone was stopped and torsemide decreased.   Cath 01/24/18 showed totally occluded RCA, 90% mLAD, 90% pLCx.  s/p CABG x 3 with MV repair,  LIMA-LAD, SVG-OM2, SVG-PDA at [facility]  Echo 12/17/21: EF 45-50% along with Grade II DD, mitral valve gradient of 3.5 mmHg with prosthetic annuloplasty ring. Echo 09/30/22 showed EF 45-50%, normal RV, s/p MV repair with no Ricardo and mean gradient 4 mmHg, IVC normal.        ROS: All systems negative except as listed in HPI, PMH and Problem List.  SH:  Social History   Socioeconomic History   Marital status: Married    Spouse name: Not on file   Number of children: Not on file   Years of education: Not on file   Highest education level: Not on file  Occupational History   Not on file  Tobacco Use   Smoking status: Never   Smokeless tobacco: Never  Vaping Use   Vaping Use: Never used  Substance and Sexual Activity   Alcohol use: Yes    Comment: rare beer   Drug use: No   Sexual activity: Not on file  Other Topics Concern   Not on file  Social History Narrative    Lives locally with wife.  Works in [employer].  Does not routinely exercise.   Social Determinants of Health   Financial Resource Strain: Not on file  Food Insecurity: No Food Insecurity (07/31/2022)   Hunger Vital Sign    Worried About Running Out of Food in the Last Year: Never true    Ran Out of Food in the Last Year: Never true  Recent Concern: Food Insecurity - Food Insecurity Present (06/02/2022)   Hunger Vital Sign    Worried About Running Out of Food in the Last Year: Sometimes true    Ran Out of Food in the Last Year: Sometimes true  Transportation Needs: No Transportation Needs (07/31/2022)   PRAPARE - Administrator, Civil Service (Medical): No    Lack of Transportation (Non-Medical): No  Physical Activity: Not on file  Stress: Not on file  Social Connections: Not on file  Intimate Partner Violence: Not At Risk (07/31/2022)   Humiliation, Afraid, Rape, and Kick questionnaire    Fear of Current or Ex-Partner: No    Emotionally Abused: No    Physically Abused: No    Sexually Abused: No    FH:  Family History  Problem Relation Age of Onset   Cancer Mother        died @ 57   CAD Father  First MI @ 40. S/p heart transplant. Died in mid-50's of cancer.   Cancer Father    Other Brother        alive and well    Past Medical History:  Diagnosis Date   Chronic combined systolic (congestive) and diastolic (congestive) heart failure (HCC)    a. 01/2018 EF 35-40%; b. 10/2020 Echo: EF 40-45%, glob HK. RVSP 42.62mmHg. Mildly dil LA. Mild MS/AoV sclerosis.   CKD (chronic kidney disease), stage III (HCC)    Coronary artery disease    a. 01/2018 late presenting inferior MI; b. 01/2018 Cath: Severe multivessel dzs-->CABG x 3 (LIMA->LAD, VG->OM2, VG->RPDA @ [facility]).   Diabetes mellitus without complication (HCC)    a. Dx ~ 2000.   Heart palpitations    a. Pt reports prior nl echo's and stress tests. Last stress test w/in past 2 yrs - PCP.   High cholesterol     Hypertension    Mitral regurgitation    a. 01/2018 s/p MV repair @ time of CABG.   Myocardial infarction [facility])    Neuropathy    legs and hands   Postoperative atrial fibrillation    a. 01/2018 @ time of CABG.   PSVT (paroxysmal supraventricular tachycardia)    a. Very symptomatic with multiple ED evaluations.  Has been on amio.   Severe sepsis with acute organ dysfunction (HCC) 12/19/2021   Sleep apnea    "mild - has CPAP, doesn't use   Sleep difficulties 09/07/2019   STEMI (ST elevation myocardial infarction) (HCC) 01/24/2018   Wears dentures    full upper and lower    Current Outpatient Medications  Medication Sig Dispense Refill   amiodarone (PACERONE) 200 MG tablet Take 1 tablet (200 mg total) by mouth daily. 90 tablet 0   ascorbic acid (VITAMIN C) 500 MG tablet Take 1 tablet (500 mg total) by mouth 2 (two) times daily. 60 tablet 3   aspirin 81 MG EC tablet Take 81 mg by mouth daily.      b complex vitamins tablet Take 1 tablet by mouth daily.      clopidogrel (PLAVIX) 75 MG tablet Take 1 tablet (75 mg total) by mouth daily. 30 tablet 6   dapagliflozin propanediol (FARXIGA) 10 MG TABS tablet Take 1 tablet (10 mg total) by mouth daily before breakfast. 30 tablet 6   Dulaglutide (TRULICITY) 1.5 MG/0.5ML SOPN Inject 1.5 mg into the skin once a week.     gabapentin (NEURONTIN) 100 MG capsule Take 1 capsule by mouth twice daily 60 capsule 0   insulin degludec (TRESIBA) 200 UNIT/ML FlexTouch Pen 32 Units at bedtime.     losartan (COZAAR) 25 MG tablet Take 1 tablet (25 mg total) by mouth daily. HOLD UNTIL FOLLOW-UP WITH YOUR DOCTOR     metFORMIN (GLUCOPHAGE) 500 MG tablet Take 2 tablets (1,000 mg total) by mouth 2 (two) times daily. 360 tablet 2   metolazone (ZAROXOLYN) 5 MG tablet Take 1 tablet (5 mg total) by mouth once a week. On Monday, hold if wt 215 lbs or less 90 tablet 0   metoprolol succinate (TOPROL-XL) 50 MG 24 hr tablet TAKE 1 TABLET BY MOUTH DAILY. TAKE WITH OR IMMEDIATELY  FOLLOWING A MEAL. 90 tablet 0   Omega-3 Fatty Acids (FISH OIL) 875 MG CAPS Take 875 mg by mouth 2 (two) times daily.     pramipexole (MIRAPEX) 1.5 MG tablet Take 1 tablet (1.5 mg total) by mouth 2 (two) times daily. 180 tablet 3   rosuvastatin (  CRESTOR) 20 MG tablet Take 1 tablet (20 mg total) by mouth daily. 90 tablet 0   spironolactone (ALDACTONE) 25 MG tablet Take 0.5 tablets (12.5 mg total) by mouth daily. 45 tablet 3   torsemide (DEMADEX) 20 MG tablet Take 60mg  in the morning and 40mg  in the evening     No current facility-administered medications for this visit.    There were no vitals filed for this visit.  PHYSICAL EXAM:  General:  Well appearing. No resp difficulty HEENT: normal Neck: supple. JVP flat. Carotids 2+ bilaterally; no bruits. No lymphadenopathy or thryomegaly appreciated. Cor: PMI normal. Regular rate & rhythm. No rubs, gallops or murmurs. Lungs: clear Abdomen: soft, nontender, nondistended. No hepatosplenomegaly. No bruits or masses. Good bowel sounds. Extremities: no cyanosis, clubbing, rash, edema Neuro: alert & orientedx3, cranial nerves grossly intact. Moves all 4 extremities w/o difficulty. Affect pleasant.   ECG:   ASSESSMENT & PLAN:

## 2023-01-26 ENCOUNTER — Telehealth: Payer: Self-pay | Admitting: Family

## 2023-01-26 ENCOUNTER — Encounter: Payer: 59 | Admitting: Family

## 2023-01-26 NOTE — Telephone Encounter (Signed)
Patient did not show for his [facility] Failure [facility] appointment on 01/26/23.   

## 2023-02-01 ENCOUNTER — Telehealth: Payer: Self-pay | Admitting: Cardiology

## 2023-02-01 MED ORDER — ROSUVASTATIN CALCIUM 20 MG PO TABS: 20.0000 mg | ORAL_TABLET | Freq: Every day | ORAL | 0 refills | Status: DC

## 2023-02-01 MED ORDER — LOSARTAN POTASSIUM 25 MG PO TABS: 25.0000 mg | ORAL_TABLET | Freq: Every day | ORAL | 0 refills | Status: DC

## 2023-02-01 NOTE — Telephone Encounter (Signed)
Requested Prescriptions   Signed Prescriptions Disp Refills   losartan (COZAAR) 25 MG tablet 90 tablet 0    Sig: Take 1 tablet (25 mg total) by mouth daily.    Authorizing Provider: Debbe Odea    Ordering User: NEWCOMER MCCLAIN, Tareek Sabo L   rosuvastatin (CRESTOR) 20 MG tablet 90 tablet 0    Sig: Take 1 tablet (20 mg total) by mouth daily.    Authorizing Provider: Debbe Odea    Ordering User: Thayer Headings, Monzerrath Mcburney L

## 2023-02-01 NOTE — Telephone Encounter (Signed)
*  STAT* If patient is at the pharmacy, call can be transferred to refill team.   1. Which medications need to be refilled? (please list name of each medication and dose if known) rosuvastatin (CRESTOR) 20 MG tablet    losartan (COZAAR) 25 MG tablet   2. Which pharmacy/location (including street and city if local pharmacy) is medication to be sent to? [facility] [address] [place] - [address]    3. Do they need a 30 day or 90 day supply? 90 day

## 2023-02-08 ENCOUNTER — Telehealth: Payer: Self-pay

## 2023-02-08 NOTE — Telephone Encounter (Signed)
Copied from CRM [id number]. Topic: General - Other >> Feb 08, 2023 11:10 AM Ricardo Brown wrote: Patient stated he dropped off a handicap placard form to be filled out a couple weeks ago & called to check the status of this handicap placard.  Patients callback #: [phone]

## 2023-02-10 ENCOUNTER — Telehealth: Payer: Self-pay | Admitting: Cardiology

## 2023-02-10 MED ORDER — LOSARTAN POTASSIUM 25 MG PO TABS: 25.0000 mg | ORAL_TABLET | Freq: Every day | ORAL | 0 refills | Status: DC

## 2023-02-10 NOTE — Telephone Encounter (Signed)
 *  STAT* If patient is at the pharmacy, call can be transferred to refill team.   1. Which medications need to be refilled? (please list name of each medication and dose if known)   losartan (COZAAR) 25 MG tablet    2. Which pharmacy/location (including street and city if local pharmacy) is medication to be sent to?[facility] [address], [place] - [address]   3. Do they need a 30 day or 90 day supply? 90 days  Pt said, his pharmacy did not receive refill for this medication

## 2023-02-10 NOTE — Telephone Encounter (Signed)
Pt called in again, checking status of paperwork for handicap sticker. He needs asap,. His tags are expired. Please call back with status

## 2023-02-10 NOTE — Telephone Encounter (Signed)
Requested Prescriptions   Signed Prescriptions Disp Refills   losartan (COZAAR) 25 MG tablet 90 tablet 0    Sig: Take 1 tablet (25 mg total) by mouth daily.    Authorizing Provider: Debbe Odea    Ordering User: Thayer Headings, Mannie Ohlin L

## 2023-02-11 NOTE — Telephone Encounter (Signed)
Pt advised this is ready for p/u

## 2023-02-12 ENCOUNTER — Other Ambulatory Visit: Payer: Self-pay | Admitting: Family Medicine

## 2023-02-12 DIAGNOSIS — E119 Type 2 diabetes mellitus without complications: Secondary | ICD-10-CM

## 2023-02-12 NOTE — Telephone Encounter (Signed)
Medication Refill - Medication: gabapentin (NEURONTIN) 100 MG capsule   Pt says he no longer sees the provider that most recently prescribed this for him. Says he has not talked to Merita Norton about this Rx but says  He has done this with 2 other medications and she filled them with "no problem"   ..please advise   Has the patient contacted their pharmacy? Yes.   (Agent: If no, request that the patient contact the pharmacy for the refill. If patient does not wish to contact the pharmacy document the reason why and proceed with request.) (Agent: If yes, when and what did the pharmacy advise?)  Preferred Pharmacy (with phone number or street name):  [facility] [address] [place] - [address]  [address] [place] [address]  Phone: [phone] Fax: [phone]   Has the patient been seen for an appointment in the last year OR does the patient have an upcoming appointment? Yes.    Agent: Please be advised that RX refills may take up to 3 business days. We ask that you follow-up with your pharmacy.

## 2023-02-16 ENCOUNTER — Other Ambulatory Visit: Payer: Self-pay | Admitting: Family Medicine

## 2023-02-16 DIAGNOSIS — E119 Type 2 diabetes mellitus without complications: Secondary | ICD-10-CM

## 2023-02-16 NOTE — Telephone Encounter (Signed)
Requested medication (s) are due for refill today:Yes  Requested medication (s) are on the active medication list: Yes  Last refill:  06/29/22  Future visit scheduled: Yes  Notes to clinic:  Unable to refill per protocol, last refill by another provider. Patient would like Robynn Pane to start refilling this medication,  no longer sees previously ordered provider, 2nd request.     Requested Prescriptions  Pending Prescriptions Disp Refills   gabapentin (NEURONTIN) 100 MG capsule 60 capsule 0    Sig: Take 1 capsule (100 mg total) by mouth 2 (two) times daily.     Neurology: Anticonvulsants - gabapentin Failed - 02/16/2023  9:41 AM      Failed - Cr in normal range and within 360 days    Creat  Date Value Ref Range Status  09/30/2020 1.31 0.70 - 1.33 mg/dL Final    Comment:    For patients >68 years of age, the reference limit for Creatinine is approximately 13% higher for people identified as African-American. .    Creatinine, Ser  Date Value Ref Range Status  12/30/2022 1.66 (H) 0.61 - 1.24 mg/dL Final   Creatinine, Urine  Date Value Ref Range Status  01/07/2023 90  Final         Passed - Completed PHQ-2 or PHQ-9 in the last 360 days      Passed - Valid encounter within last 12 months    Recent Outpatient Visits           4 weeks ago Welcome to [employer] preventive visit   [facility] Merita Norton T, FNP   2 months ago Pulmonary edema with congestive heart failure with reduced left ventricular function [facility])   [facility] Jacky Kindle, FNP       Future Appointments             In 2 months Jacky Kindle, FNP [facility]   In 4 months Azucena Cecil, Arlys John, MD [facility]

## 2023-02-16 NOTE — Telephone Encounter (Signed)
Medication Refill - Medication: gabapentin (NEURONTIN) 100 MG capsule   Pt says he no longer sees the provider, Corky Downs, MD, who most recently prescribed this for him. He says he has not talked to Merita Norton about this Rx but says  He has done this with two other medications, and she filled them with "no problem."    Seeking clinical advice. Pt is requesting a callback.   Has the patient contacted their pharmacy? Yes.    (Agent: If yes, when and what did the pharmacy advise?)  Preferred Pharmacy (with phone number or street name):  [facility] [address] [place] - [address]  [address] [place] [address]  Phone: [phone] Fax: [phone]  Hours: Not open 24 hours   Has the patient been seen for an appointment in the last year OR does the patient have an upcoming appointment? Yes.    Agent: Please be advised that RX refills may take up to 3 business days. We ask that you follow-up with your pharmacy.

## 2023-02-17 MED ORDER — GABAPENTIN 100 MG PO CAPS
100.0000 mg | ORAL_CAPSULE | Freq: Two times a day (BID) | ORAL | 3 refills | Status: DC
Start: 2023-02-17 — End: 2024-03-14

## 2023-03-22 ENCOUNTER — Emergency Department: Payer: Medicare Other

## 2023-03-22 ENCOUNTER — Inpatient Hospital Stay
Admission: EM | Admit: 2023-03-22 | Discharge: 2023-03-24 | DRG: 623 | Disposition: A | Discharge status: Home / Self Care | Payer: Medicare Other | Attending: Osteopathic Medicine | Admitting: Osteopathic Medicine

## 2023-03-22 ENCOUNTER — Other Ambulatory Visit: Payer: Self-pay

## 2023-03-22 ENCOUNTER — Inpatient Hospital Stay: Payer: Medicare Other

## 2023-03-22 DIAGNOSIS — Z961 Presence of intraocular lens: Secondary | ICD-10-CM | POA: Diagnosis present

## 2023-03-22 DIAGNOSIS — I2510 Atherosclerotic heart disease of native coronary artery without angina pectoris: Secondary | ICD-10-CM | POA: Diagnosis present

## 2023-03-22 DIAGNOSIS — Z9842 Cataract extraction status, left eye: Secondary | ICD-10-CM

## 2023-03-22 DIAGNOSIS — G4730 Sleep apnea, unspecified: Secondary | ICD-10-CM | POA: Diagnosis present

## 2023-03-22 DIAGNOSIS — Z951 Presence of aortocoronary bypass graft: Secondary | ICD-10-CM

## 2023-03-22 DIAGNOSIS — Z888 Allergy status to other drugs, medicaments and biological substances status: Secondary | ICD-10-CM

## 2023-03-22 DIAGNOSIS — I5042 Chronic combined systolic (congestive) and diastolic (congestive) heart failure: Secondary | ICD-10-CM | POA: Diagnosis present

## 2023-03-22 DIAGNOSIS — Z7984 Long term (current) use of oral hypoglycemic drugs: Secondary | ICD-10-CM | POA: Diagnosis not present

## 2023-03-22 DIAGNOSIS — N183 Chronic kidney disease, stage 3 unspecified: Secondary | ICD-10-CM | POA: Diagnosis not present

## 2023-03-22 DIAGNOSIS — L089 Local infection of the skin and subcutaneous tissue, unspecified: Secondary | ICD-10-CM | POA: Diagnosis not present

## 2023-03-22 DIAGNOSIS — E7800 Pure hypercholesterolemia, unspecified: Secondary | ICD-10-CM | POA: Diagnosis present

## 2023-03-22 DIAGNOSIS — I252 Old myocardial infarction: Secondary | ICD-10-CM | POA: Diagnosis not present

## 2023-03-22 DIAGNOSIS — E1152 Type 2 diabetes mellitus with diabetic peripheral angiopathy with gangrene: Secondary | ICD-10-CM | POA: Diagnosis present

## 2023-03-22 DIAGNOSIS — E11628 Type 2 diabetes mellitus with other skin complications: Secondary | ICD-10-CM | POA: Diagnosis present

## 2023-03-22 DIAGNOSIS — Z8249 Family history of ischemic heart disease and other diseases of the circulatory system: Secondary | ICD-10-CM

## 2023-03-22 DIAGNOSIS — Z7985 Long-term (current) use of injectable non-insulin antidiabetic drugs: Secondary | ICD-10-CM

## 2023-03-22 DIAGNOSIS — E11621 Type 2 diabetes mellitus with foot ulcer: Secondary | ICD-10-CM | POA: Diagnosis present

## 2023-03-22 DIAGNOSIS — L97511 Non-pressure chronic ulcer of other part of right foot limited to breakdown of skin: Secondary | ICD-10-CM | POA: Diagnosis present

## 2023-03-22 DIAGNOSIS — E785 Hyperlipidemia, unspecified: Secondary | ICD-10-CM | POA: Diagnosis present

## 2023-03-22 DIAGNOSIS — E1169 Type 2 diabetes mellitus with other specified complication: Secondary | ICD-10-CM | POA: Diagnosis present

## 2023-03-22 DIAGNOSIS — Z89421 Acquired absence of other right toe(s): Secondary | ICD-10-CM

## 2023-03-22 DIAGNOSIS — L03115 Cellulitis of right lower limb: Secondary | ICD-10-CM | POA: Diagnosis present

## 2023-03-22 DIAGNOSIS — E1142 Type 2 diabetes mellitus with diabetic polyneuropathy: Secondary | ICD-10-CM | POA: Diagnosis present

## 2023-03-22 DIAGNOSIS — I96 Gangrene, not elsewhere classified: Secondary | ICD-10-CM | POA: Diagnosis present

## 2023-03-22 DIAGNOSIS — I739 Peripheral vascular disease, unspecified: Secondary | ICD-10-CM | POA: Diagnosis present

## 2023-03-22 DIAGNOSIS — Z91199 Patient's noncompliance with other medical treatment and regimen due to unspecified reason: Secondary | ICD-10-CM | POA: Diagnosis not present

## 2023-03-22 DIAGNOSIS — I130 Hypertensive heart and chronic kidney disease with heart failure and stage 1 through stage 4 chronic kidney disease, or unspecified chronic kidney disease: Secondary | ICD-10-CM | POA: Diagnosis present

## 2023-03-22 DIAGNOSIS — Z8619 Personal history of other infectious and parasitic diseases: Secondary | ICD-10-CM

## 2023-03-22 DIAGNOSIS — Z952 Presence of prosthetic heart valve: Secondary | ICD-10-CM

## 2023-03-22 DIAGNOSIS — E1122 Type 2 diabetes mellitus with diabetic chronic kidney disease: Secondary | ICD-10-CM | POA: Diagnosis present

## 2023-03-22 DIAGNOSIS — Z9889 Other specified postprocedural states: Secondary | ICD-10-CM

## 2023-03-22 DIAGNOSIS — N179 Acute kidney failure, unspecified: Secondary | ICD-10-CM | POA: Diagnosis present

## 2023-03-22 DIAGNOSIS — Z794 Long term (current) use of insulin: Secondary | ICD-10-CM | POA: Diagnosis not present

## 2023-03-22 DIAGNOSIS — I70235 Atherosclerosis of native arteries of right leg with ulceration of other part of foot: Secondary | ICD-10-CM | POA: Diagnosis not present

## 2023-03-22 DIAGNOSIS — L97519 Non-pressure chronic ulcer of other part of right foot with unspecified severity: Secondary | ICD-10-CM | POA: Diagnosis not present

## 2023-03-22 DIAGNOSIS — Z89422 Acquired absence of other left toe(s): Secondary | ICD-10-CM

## 2023-03-22 DIAGNOSIS — I5020 Unspecified systolic (congestive) heart failure: Secondary | ICD-10-CM

## 2023-03-22 DIAGNOSIS — Z7902 Long term (current) use of antithrombotics/antiplatelets: Secondary | ICD-10-CM

## 2023-03-22 DIAGNOSIS — Z89412 Acquired absence of left great toe: Secondary | ICD-10-CM

## 2023-03-22 DIAGNOSIS — Z9861 Coronary angioplasty status: Secondary | ICD-10-CM

## 2023-03-22 DIAGNOSIS — I7025 Atherosclerosis of native arteries of other extremities with ulceration: Secondary | ICD-10-CM | POA: Diagnosis present

## 2023-03-22 DIAGNOSIS — Z7982 Long term (current) use of aspirin: Secondary | ICD-10-CM

## 2023-03-22 DIAGNOSIS — N1831 Chronic kidney disease, stage 3a: Secondary | ICD-10-CM | POA: Diagnosis present

## 2023-03-22 DIAGNOSIS — Z79899 Other long term (current) drug therapy: Secondary | ICD-10-CM

## 2023-03-22 DIAGNOSIS — E1140 Type 2 diabetes mellitus with diabetic neuropathy, unspecified: Secondary | ICD-10-CM | POA: Diagnosis present

## 2023-03-22 DIAGNOSIS — Z809 Family history of malignant neoplasm, unspecified: Secondary | ICD-10-CM

## 2023-03-22 DIAGNOSIS — G2581 Restless legs syndrome: Secondary | ICD-10-CM

## 2023-03-22 LAB — CBC
HCT: 38 % — ABNORMAL LOW (ref 39.0–52.0)
Hemoglobin: 12.4 g/dL — ABNORMAL LOW (ref 13.0–17.0)
MCH: 29.3 pg (ref 26.0–34.0)
MCHC: 32.6 g/dL (ref 30.0–36.0)
MCV: 89.8 fL (ref 80.0–100.0)
Platelets: 207 10*3/uL (ref 150–400)
RBC: 4.23 MIL/uL (ref 4.22–5.81)
RDW: 14.4 % (ref 11.5–15.5)
WBC: 10.4 10*3/uL (ref 4.0–10.5)
nRBC: 0 % (ref 0.0–0.2)

## 2023-03-22 LAB — GLUCOSE, CAPILLARY
Glucose-Capillary: 156 mg/dL — ABNORMAL HIGH (ref 70–99)
Glucose-Capillary: 174 mg/dL — ABNORMAL HIGH (ref 70–99)

## 2023-03-22 LAB — BASIC METABOLIC PANEL
Anion gap: 11 (ref 5–15)
BUN: 49 mg/dL — ABNORMAL HIGH (ref 8–23)
CO2: 26 mmol/L (ref 22–32)
Calcium: 9 mg/dL (ref 8.9–10.3)
Chloride: 97 mmol/L — ABNORMAL LOW (ref 98–111)
Creatinine, Ser: 1.98 mg/dL — ABNORMAL HIGH (ref 0.61–1.24)
GFR, Estimated: 38 mL/min — ABNORMAL LOW (ref >60)
Glucose, Bld: 140 mg/dL — ABNORMAL HIGH (ref 70–99)
Potassium: 4.2 mmol/L (ref 3.5–5.1)
Sodium: 134 mmol/L — ABNORMAL LOW (ref 135–145)

## 2023-03-22 LAB — PREALBUMIN: Prealbumin: 27 mg/dL (ref 18–38)

## 2023-03-22 LAB — SEDIMENTATION RATE: Sed Rate: 58 mm/hr — ABNORMAL HIGH (ref 0–20)

## 2023-03-22 LAB — LACTIC ACID, PLASMA
Lactic Acid, Venous: 1.2 mmol/L (ref 0.5–1.9)
Lactic Acid, Venous: 2 mmol/L (ref 0.5–1.9)
Lactic Acid, Venous: 1.7 mmol/L (ref 0.5–1.9)
Lactic Acid, Venous: 1.8 mmol/L (ref 0.5–1.9)

## 2023-03-22 LAB — C-REACTIVE PROTEIN: CRP: 8.8 mg/dL — ABNORMAL HIGH (ref <1.0)

## 2023-03-22 LAB — HIV ANTIBODY (ROUTINE TESTING W REFLEX): HIV Screen 4th Generation wRfx: NONREACTIVE

## 2023-03-22 MED ORDER — INSULIN ASPART 100 UNIT/ML IJ SOLN: 0.0000 [IU] | Freq: Every day | INTRAMUSCULAR | Status: DC

## 2023-03-22 MED ORDER — TORSEMIDE 20 MG PO TABS
50.0000 mg | ORAL_TABLET | Freq: Two times a day (BID) | ORAL | Status: DC
  Administered 2023-03-22 – 2023-03-23 (×3): 50 mg via ORAL
  Filled 2023-03-22 (×3): qty 3

## 2023-03-22 MED ORDER — INSULIN GLARGINE-YFGN 100 UNIT/ML ~~LOC~~ SOLN
16.0000 [IU] | Freq: Every day | SUBCUTANEOUS | Status: DC
  Administered 2023-03-22 – 2023-03-23 (×2): 16 [IU] via SUBCUTANEOUS
  Filled 2023-03-22 (×3): qty 0.16

## 2023-03-22 MED ORDER — PRAMIPEXOLE DIHYDROCHLORIDE 1 MG PO TABS
1.5000 mg | ORAL_TABLET | Freq: Two times a day (BID) | ORAL | Status: DC
  Administered 2023-03-23: 1.5 mg via ORAL
  Filled 2023-03-22 (×2): qty 2

## 2023-03-22 MED ORDER — ONDANSETRON HCL 4 MG PO TABS: 4.0000 mg | ORAL_TABLET | Freq: Four times a day (QID) | ORAL | Status: DC | PRN

## 2023-03-22 MED ORDER — ROSUVASTATIN CALCIUM 10 MG PO TABS
20.0000 mg | ORAL_TABLET | Freq: Every day | ORAL | Status: DC
  Administered 2023-03-23 – 2023-03-24 (×2): 20 mg via ORAL
  Filled 2023-03-22 (×2): qty 2

## 2023-03-22 MED ORDER — CLOPIDOGREL BISULFATE 75 MG PO TABS
75.0000 mg | ORAL_TABLET | Freq: Every day | ORAL | Status: DC
  Administered 2023-03-23 – 2023-03-24 (×2): 75 mg via ORAL
  Filled 2023-03-22 (×2): qty 1

## 2023-03-22 MED ORDER — SODIUM CHLORIDE 0.9 % IV SOLN: INTRAVENOUS | Status: DC

## 2023-03-22 MED ORDER — SPIRONOLACTONE 12.5 MG HALF TABLET
12.5000 mg | ORAL_TABLET | Freq: Every day | ORAL | Status: DC
  Administered 2023-03-23: 12.5 mg via ORAL
  Filled 2023-03-22 (×2): qty 1

## 2023-03-22 MED ORDER — GABAPENTIN 100 MG PO CAPS
100.0000 mg | ORAL_CAPSULE | Freq: Two times a day (BID) | ORAL | Status: DC
  Administered 2023-03-22 – 2023-03-24 (×5): 100 mg via ORAL
  Filled 2023-03-22 (×5): qty 1

## 2023-03-22 MED ORDER — AMIODARONE HCL 200 MG PO TABS
200.0000 mg | ORAL_TABLET | Freq: Every day | ORAL | Status: DC
  Administered 2023-03-23 – 2023-03-24 (×2): 200 mg via ORAL
  Filled 2023-03-22 (×2): qty 1

## 2023-03-22 MED ORDER — LOSARTAN POTASSIUM 25 MG PO TABS
25.0000 mg | ORAL_TABLET | Freq: Every day | ORAL | Status: DC
  Administered 2023-03-23 – 2023-03-24 (×2): 25 mg via ORAL
  Filled 2023-03-22 (×2): qty 1

## 2023-03-22 MED ORDER — ENOXAPARIN SODIUM 60 MG/0.6ML IJ SOSY
0.5000 mg/kg | PREFILLED_SYRINGE | INTRAMUSCULAR | Status: DC
  Administered 2023-03-22 – 2023-03-23 (×2): 47.5 mg via SUBCUTANEOUS
  Filled 2023-03-22 (×2): qty 0.6

## 2023-03-22 MED ORDER — ONDANSETRON HCL 4 MG/2ML IJ SOLN: 4.0000 mg | Freq: Four times a day (QID) | INTRAMUSCULAR | Status: DC | PRN

## 2023-03-22 MED ORDER — METOLAZONE 5 MG PO TABS: 5.0000 mg | ORAL_TABLET | ORAL | Status: DC

## 2023-03-22 MED ORDER — VANCOMYCIN HCL 2000 MG/400ML IV SOLN
2000.0000 mg | Freq: Once | INTRAVENOUS | Status: AC
  Administered 2023-03-22: 2000 mg via INTRAVENOUS
  Filled 2023-03-22 (×2): qty 400

## 2023-03-22 MED ORDER — SODIUM CHLORIDE 0.9 % IV SOLN
2.0000 g | INTRAVENOUS | Status: DC
  Administered 2023-03-22: 2 g via INTRAVENOUS
  Filled 2023-03-22: qty 20

## 2023-03-22 MED ORDER — METOPROLOL SUCCINATE ER 50 MG PO TB24
50.0000 mg | ORAL_TABLET | Freq: Every day | ORAL | Status: DC
  Administered 2023-03-23 – 2023-03-24 (×2): 50 mg via ORAL
  Filled 2023-03-22 (×2): qty 1

## 2023-03-22 MED ORDER — VANCOMYCIN HCL 1750 MG/350ML IV SOLN: 1750.0000 mg | INTRAVENOUS | Status: DC

## 2023-03-22 MED ORDER — INSULIN ASPART 100 UNIT/ML IJ SOLN
0.0000 [IU] | Freq: Three times a day (TID) | INTRAMUSCULAR | Status: DC
  Administered 2023-03-22 – 2023-03-23 (×3): 2 [IU] via SUBCUTANEOUS
  Administered 2023-03-24: 1 [IU] via SUBCUTANEOUS
  Filled 2023-03-22 (×5): qty 1

## 2023-03-22 MED ORDER — SODIUM CHLORIDE 0.9 % IV SOLN
2.0000 g | Freq: Two times a day (BID) | INTRAVENOUS | Status: DC
  Administered 2023-03-23 – 2023-03-24 (×4): 2 g via INTRAVENOUS
  Filled 2023-03-22 (×5): qty 12.5

## 2023-03-22 MED ORDER — METRONIDAZOLE 500 MG/100ML IV SOLN
500.0000 mg | Freq: Two times a day (BID) | INTRAVENOUS | Status: DC
  Administered 2023-03-22 – 2023-03-24 (×5): 500 mg via INTRAVENOUS
  Filled 2023-03-22 (×6): qty 100

## 2023-03-22 NOTE — ED Provider Notes (Signed)
[facility] Provider Note    Event Date/Time   First MD Initiated Contact with Patient 03/22/23 1138     (approximate)   History   Foot Pain   HPI  Ricardo Brown is a 62 y.o. male with a history of diabetes, diabetic neuropathy, toe amputation in the past who presents with foot infection.  Patient reports he noticed a blister to the right lateral aspect of his right foot 3 or 4 days ago, he reports redness has developed so he came straight here.  He sees Biomedical engineer.  No fever     Physical Exam   Triage Vital Signs: ED Triage Vitals  Encounter Vitals Group     BP 03/22/23 1037 (!) 147/77     Systolic BP Percentile --      Diastolic BP Percentile --      Pulse Rate 03/22/23 1037 89     Resp 03/22/23 1037 18     Temp 03/22/23 1037 98.5 F (36.9 C)     Temp src --      SpO2 03/22/23 1037 100 %     Weight 03/22/23 1328 97.1 kg (214 lb)     Height 03/22/23 1328 1.727 m (5\' 8" )     Head Circumference --      Peak Flow --      Pain Score 03/22/23 1035 3     Pain Loc --      Pain Education --      Exclude from Growth Chart --     Most recent vital signs: Vitals:   03/22/23 1037  BP: (!) 147/77  Pulse: 89  Resp: 18  Temp: 98.5 F (36.9 C)  SpO2: 100%     General: Awake, no distress.  CV:  Good peripheral perfusion.  Resp:  Normal effort.  Abd:  No distention.  Other:   Erythema noted to the lateral and dorsal aspect of the forefoot, blister with no significant purulent drainage   ED Results / Procedures / Treatments   Labs (all labs ordered are listed, but only abnormal results are displayed) Labs Reviewed  CBC - Abnormal; Notable for the following components:      Result Value   Hemoglobin 12.4 (*)    HCT 38.0 (*)    All other components within normal limits  BASIC METABOLIC PANEL - Abnormal; Notable for the following components:   Sodium 134 (*)    Chloride 97 (*)    Glucose, Bld 140 (*)    BUN 49  (*)    Creatinine, Ser 1.98 (*)    GFR, Estimated 38 (*)    All other components within normal limits  LACTIC ACID, PLASMA - Abnormal; Notable for the following components:   Lactic Acid, Venous 2.0 (*)    All other components within normal limits  CULTURE, BLOOD (ROUTINE X 2)  CULTURE, BLOOD (ROUTINE X 2)  AEROBIC/ANAEROBIC CULTURE W GRAM STAIN (SURGICAL/DEEP WOUND)  LACTIC ACID, PLASMA  HIV ANTIBODY (ROUTINE TESTING W REFLEX)  HEMOGLOBIN A1C  SEDIMENTATION RATE  C-REACTIVE PROTEIN  PREALBUMIN     EKG     RADIOLOGY X-ray viewed interpret by me, no evidence of osteomyelitis    PROCEDURES:  Critical Care performed:   Procedures   MEDICATIONS ORDERED IN ED: Medications  cefTRIAXone (ROCEPHIN) 2 g in sodium chloride 0.9 % 100 mL IVPB (0 g Intravenous Stopped 03/22/23 1352)  metroNIDAZOLE (FLAGYL) IVPB 500 mg (500 mg Intravenous New Bag/Given 03/22/23 1358)  vancomycin (VANCOREADY) IVPB 2000 mg/400 mL (has no administration in time range)  amiodarone (PACERONE) tablet 200 mg (has no administration in time range)  gabapentin (NEURONTIN) capsule 100 mg (100 mg Oral Given 03/22/23 1403)  losartan (COZAAR) tablet 25 mg (has no administration in time range)  metolazone (ZAROXOLYN) tablet 5 mg (has no administration in time range)  metoprolol succinate (TOPROL-XL) 24 hr tablet 50 mg (has no administration in time range)  pramipexole (MIRAPEX) tablet 1.5 mg (has no administration in time range)  rosuvastatin (CRESTOR) tablet 20 mg (has no administration in time range)  spironolactone (ALDACTONE) tablet 12.5 mg (has no administration in time range)  torsemide (DEMADEX) tablet 50 mg (has no administration in time range)  enoxaparin (LOVENOX) injection 47.5 mg (has no administration in time range)  0.9 %  sodium chloride infusion (has no administration in time range)  ondansetron (ZOFRAN) tablet 4 mg (has no administration in time range)    Or  ondansetron (ZOFRAN) injection 4 mg  (has no administration in time range)     IMPRESSION / MDM / ASSESSMENT AND PLAN / ED COURSE  I reviewed the triage vital signs and the nursing notes. Patient's presentation is most consistent with acute presentation with potential threat to life or bodily function.  Patient presents with diabetic foot infection as detailed above, he is afebrile, no indication of sepsis, lab work demonstrates normal white blood cell count, normal lactic acid  Consulted with Dr. Excell Seltzer of podiatry who saw the patient in the ED and recommends IV antibiotics, admission  Discussed with Dr. Alvester Morin who will admit the patient        FINAL CLINICAL IMPRESSION(S) / ED DIAGNOSES   Final diagnoses:  Diabetic foot infection (HCC)     Rx / DC Orders   ED Discharge Orders     None        Note:  This document was prepared using Dragon voice recognition software and may include unintentional dictation errors.   [name], MD 03/22/23 1434

## 2023-03-22 NOTE — Assessment & Plan Note (Addendum)
Worsening purulent drainage from the right foot in the setting of history of infection requiring amputation. Status post wound debridement by Dr. Excell Seltzer with podiatry in the ER Noted history of diabetic foot infection osteomyelitis requiring operative repair in the past Pending MRI of the foot to better assess IV cefepime, Flagyl vancomycin for infectious coverage Blood cultures Follow-up imaging Formal wound care consult obtained Follow-up podiatry recommendations

## 2023-03-22 NOTE — Assessment & Plan Note (Signed)
Creatinine 2 today with GFR in the 30s Baseline creatinine around 1.4-2.1 Monitor

## 2023-03-22 NOTE — H&P (Signed)
History and Physical    Patient: Ricardo Brown UJW:[id number] DOB: 1960/11/14 DOA: 03/22/2023 DOS: the patient was seen and examined on 03/22/2023 PCP: Jacky Kindle, FNP  Patient coming from: Home  Chief Complaint:  Chief Complaint  Patient presents with   Foot Pain   HPI: [name] is a 62 y.o. male with medical history significant of multiple medical issues including HFrEF, stage Brown CKD, CAD, type 2 diabetes, hyperlipidemia, hypertension, mitral regurgitation, CAD, neuropathy, sleep apnea, history of diabetic foot infection status post amputation presenting with diabetic foot infection and?  Osteomyelitis.  Patient does be followed by outpatient podiatry.  Has had worsening purulent drainage over multiple days involving the lateral aspect of the right foot.  No fevers or chills.  Patient concerned about purulent drainage and subsequently came to the ER for further evaluation.  Noted baseline history of diabetic foot infection with noted transmetatarsal amputation of the left foot January 2024, as well as second toe partial amputation of the right foot May 2023.  Patient reports blood sugars usually in the 140s to 150s.  No reported alcohol or tobacco use.  Has been compliant with home medication regimen including antiplatelets and diuretics. Presented to the ER afebrile, hemodynamically stable.  Creatinine 2, glucose 140.  White count 10.4, hemoglobin 12.4, platelets 207.  Lactate 2.  Right foot plain films and MRI pending. Review of Systems: As mentioned in the history of present illness. All other systems reviewed and are negative. Past Medical History:  Diagnosis Date   Chronic combined systolic (congestive) and diastolic (congestive) heart failure (HCC)    a. 01/2018 EF 35-40%; b. 10/2020 Echo: EF 40-45%, glob HK. RVSP 42.60mmHg. Mildly dil LA. Mild MS/AoV sclerosis.   CKD (chronic kidney disease), stage Brown (HCC)    Coronary artery disease    a. 01/2018 late  presenting inferior MI; b. 01/2018 Cath: Severe multivessel dzs-->CABG x 3 (LIMA->LAD, VG->OM2, VG->RPDA @ [facility]).   Diabetes mellitus without complication (HCC)    a. Dx ~ 2000.   Heart palpitations    a. Pt reports prior nl echo's and stress tests. Last stress test w/in past 2 yrs - PCP.   High cholesterol    Hypertension    Mitral regurgitation    a. 01/2018 s/p MV repair @ time of CABG.   Myocardial infarction [facility])    Neuropathy    legs and hands   Postoperative atrial fibrillation    a. 01/2018 @ time of CABG.   PSVT (paroxysmal supraventricular tachycardia)    a. Very symptomatic with multiple ED evaluations.  Has been on amio.   Severe sepsis with acute organ dysfunction (HCC) 12/19/2021   Sleep apnea    "mild - has CPAP, doesn't use   Sleep difficulties 09/07/2019   STEMI (ST elevation myocardial infarction) (HCC) 01/24/2018   Wears dentures    full upper and lower   Past Surgical History:  Procedure Laterality Date   AMPUTATION TOE Right 08/29/2021   Procedure: AMPUTATION TOE-2nd toe;  Surgeon: Linus Galas, DPM;  Location: [facility] ORS;  Service: Podiatry;  Laterality: Right;   AMPUTATION TOE Left 12/22/2021   Procedure: AMPUTATION TOE-2nd Toe Partial;  Surgeon: Rosetta Posner, DPM;  Location: [facility];  Service: Podiatry;  Laterality: Left;   CARDIAC CATHETERIZATION     CARDIAC VALVE REPLACEMENT     Mitral Valve Repair   CATARACT EXTRACTION W/PHACO Left 10/08/2022   Procedure: CATARACT EXTRACTION PHACO AND INTRAOCULAR LENS PLACEMENT (IOC) LEFT DIABETIC OMIDRIA  20.94 01:35.9;  Surgeon: Estanislado Pandy, MD;  Location: [facility];  Service: Ophthalmology;  Laterality: Left;  Diabetic   COLONOSCOPY WITH PROPOFOL N/A 06/07/2019   Procedure: COLONOSCOPY WITH PROPOFOL;  Surgeon: Pasty Spillers, MD;  Location: [facility] ENDOS[facility];  Service: Endoscopy;  Laterality: N/A;   COLONOSCOPY WITH PROPOFOL N/A 02/07/2020   Procedure: COLONOSCOPY WITH PROPOFOL;  Surgeon: Pasty Spillers, MD;  Location: [facility] ENDOS[facility];  Service: Endoscopy;  Laterality: N/A;   COLONOSCOPY WITH PROPOFOL N/A 12/11/2020   Procedure: COLONOSCOPY WITH PROPOFOL;  Surgeon: Pasty Spillers, MD;  Location: [facility] ENDOSCOPY;  Service: Endoscopy;  Laterality: N/A;   CORONARY ANGIOPLASTY     CORONARY ARTERY BYPASS GRAFT     x 3   01/2018   ESOPHAGOGASTRODUODENOSCOPY N/A 02/07/2020   Procedure: ESOPHAGOGASTRODUODENOSCOPY (EGD);  Surgeon: Pasty Spillers, MD;  Location: [facility];  Service: Endoscopy;  Laterality: N/A;   INCISION AND DRAINAGE Left 06/05/2022   Procedure: INCISION AND DRAINAGE;  Surgeon: Rosetta Posner, DPM;  Location: [facility];  Service: Podiatry;  Laterality: Left;   LEFT HEART CATH AND CORONARY ANGIOGRAPHY N/A 01/24/2018   Procedure: LEFT HEART CATH AND CORONARY ANGIOGRAPHY;  Surgeon: Iran Ouch, MD;  Location: [facility] INVASIVE CV LAB;  Service: Cardiovascular;  Laterality: N/A;   LOWER EXTREMITY ANGIOGRAPHY Left 06/08/2022   Procedure: Lower Extremity Angiography;  Surgeon: Annice Needy, MD;  Location: [facility] INVASIVE CV LAB;  Service: Cardiovascular;  Laterality: Left;   SVT ABLATION N/A 09/12/2021   Procedure: SVT ABLATION;  Surgeon: Lanier Prude, MD;  Location: [facility]VAS[facility] CV LAB;  Service: Cardiovascular;  Laterality: N/A;   TONSILLECTOMY     TRANSMETATARSAL AMPUTATION Left 08/04/2022   Procedure: TRANSMETATARSAL AMPUTATION;  Surgeon: Linus Galas, DPM;  Location: [facility];  Service: Podiatry;  Laterality: Left;   Social History:  reports that he has never smoked. He has never used smokeless tobacco. He reports current alcohol use. He reports that he does not use drugs.  Allergies  Allergen Reactions   Lipitor [Atorvastatin] Palpitations    SVT   Sacubitril-Valsartan Other (See Comments)    SVT   Pregabalin Palpitations    SVT    Family History  Problem Relation Age of Onset   Cancer Mother        died @ 52   CAD Father        First MI @ 49. S/p heart  transplant. Died in mid-50's of cancer.   Cancer Father    Other Brother        alive and well    Prior to Admission medications   Medication Sig Start Date End Date Taking? Authorizing Provider  amiodarone (PACERONE) 200 MG tablet Take 1 tablet (200 mg total) by mouth daily. 11/16/22  Yes Agbor-Etang, Arlys John, MD  ascorbic acid (VITAMIN C) 500 MG tablet Take 1 tablet (500 mg total) by mouth 2 (two) times daily. 06/09/22  Yes Rai, Ripudeep K, MD  b complex vitamins tablet Take 1 tablet by mouth daily.    Yes [provider]  clopidogrel (PLAVIX) 75 MG tablet Take 1 tablet (75 mg total) by mouth daily. 11/23/22  Yes Georgiana Spinner, NP  dapagliflozin propanediol (FARXIGA) 10 MG TABS tablet Take 1 tablet (10 mg total) by mouth daily before breakfast. 12/09/22  Yes Laurey Morale, MD  Dulaglutide (TRULICITY) 1.5 MG/0.5ML SOPN Inject 1.5 mg into the skin once a week.   Yes [provider]  gabapentin (NEURONTIN) 100 MG  capsule Take 1 capsule (100 mg total) by mouth 2 (two) times daily. 02/17/23  Yes Merita Norton T, FNP  insulin degludec (TRESIBA) 200 UNIT/ML FlexTouch Pen 32 Units at bedtime.   Yes [provider]  losartan (COZAAR) 25 MG tablet Take 1 tablet (25 mg total) by mouth daily. 02/10/23  Yes Debbe Odea, MD  metFORMIN (GLUCOPHAGE) 500 MG tablet Take 2 tablets (1,000 mg total) by mouth 2 (two) times daily. 10/23/21  Yes Kara Dies, NP  metolazone (ZAROXOLYN) 5 MG tablet Take 1 tablet (5 mg total) by mouth once a week. On Monday, hold if wt 215 lbs or less 12/17/22  Yes Bensimhon, Bevelyn Buckles, MD  metoprolol succinate (TOPROL-XL) 50 MG 24 hr tablet TAKE 1 TABLET BY MOUTH DAILY. TAKE WITH OR IMMEDIATELY FOLLOWING A MEAL. 06/29/22  Yes Masoud, Renda Rolls, MD  Omega-3 Fatty Acids (FISH OIL) 875 MG CAPS Take 875 mg by mouth 2 (two) times daily.   Yes [provider]  pramipexole (MIRAPEX) 1.5 MG tablet Take 1 tablet (1.5 mg total) by mouth 2 (two) times daily.  01/13/23  Yes Jacky Kindle, FNP  rosuvastatin (CRESTOR) 20 MG tablet Take 1 tablet (20 mg total) by mouth daily. 02/01/23 05/02/23 Yes Agbor-Etang, Arlys John, MD  spironolactone (ALDACTONE) 25 MG tablet Take 0.5 tablets (12.5 mg total) by mouth daily. 12/30/22  Yes Laurey Morale, MD  torsemide [facility]) 20 MG tablet Take 60mg  in the morning and 40mg  in the evening 12/30/22  Yes Laurey Morale, MD  aspirin 81 MG EC tablet Take 81 mg by mouth daily.     [provider]    Physical Exam: Vitals:   03/22/23 1037 03/22/23 1328  BP: (!) 147/77   Pulse: 89   Resp: 18   Temp: 98.5 F (36.9 C)   SpO2: 100%   Weight:  97.1 kg  Height:  5\' 8"  (1.727 m)   Physical Exam Constitutional:      Appearance: He is normal weight.  HENT:     Head: Normocephalic.     Mouth/Throat:     Mouth: Mucous membranes are moist.  Eyes:     Pupils: Pupils are equal, round, and reactive to light.  Cardiovascular:     Rate and Rhythm: Normal rate and regular rhythm.  Pulmonary:     Effort: Pulmonary effort is normal.  Abdominal:     General: Bowel sounds are normal.  Musculoskeletal:        General: Normal range of motion.  Skin:    Comments: See picture  Neurological:     General: No focal deficit present.  Psychiatric:        Mood and Affect: Mood normal.     Data Reviewed:  There are no new results to review at this time. VAS [place] ABI WITH/WO TBI  LOWER EXTREMITY DOPPLER STUDY  Patient Name:  SHERRY LUPI  Date of Exam:   11/19/2022 Medical Rec #: [id number]            Accession #:    [id number] Date of Birth: [birth year]           Patient Gender: M Patient Age:   73 years Exam Location:  [facility] Procedure:      VAS [place] ABI WITH/WO TBI Referring Phys:  --------------------------------------------------------------------------------   Indications: Peripheral artery disease. all left toes amputated   Vascular Interventions: Percutaneous transluminal angioplasty of  left posterior  tibial artery and tibioperoneal trunk with 3 mm diameter                         angioplasty balloon                         5. Percutaneous transluminal angioplasty of the left                         tibioperoneal trunk with 4 mm diam Lutonix drug-coated                         angioplasty                         6. Percutaneous transluminal angioplasty of the left                         anterior tibial artery with 3 mm angioplasty balloon.  Performing Technologist: Salvadore Farber RVT    Examination Guidelines: A complete evaluation includes at minimum, Doppler waveform signals and systolic blood pressure reading at the level of bilateral brachial, anterior tibial, and posterior tibial arteries, when vessel segments are accessible. Bilateral testing is considered an integral part of a complete examination. Photoelectric Plethysmograph (PPG) waveforms and toe systolic pressure readings are included as required and additional duplex testing as needed. Limited examinations for reoccurring indications may be performed as noted.    ABI Findings: +---------+------------------+-----+---------+--------+ Right    Rt Pressure (mmHg)IndexWaveform Comment  +---------+------------------+-----+---------+--------+ Brachial 133                                      +---------+------------------+-----+---------+--------+ ATA      136               1.02 biphasic          +---------+------------------+-----+---------+--------+ PTA      170               1.28 triphasic         +---------+------------------+-----+---------+--------+ Great Toe93                0.70 Normal            +---------+------------------+-----+---------+--------+  +----+------------------+-----+---------+-------+ LeftLt Pressure (mmHg)IndexWaveform Comment +----+------------------+-----+---------+-------+ ATA 153               1.15 triphasic         +----+------------------+-----+---------+-------+ PTA 166               1.25 triphasic        +----+------------------+-----+---------+-------+  +-------+-----------+-----------+------------+------------+ ABI/TBIToday's ABIToday's TBIPrevious ABIPrevious TBI +-------+-----------+-----------+------------+------------+ Right  1.28       .70        1.27        .50          +-------+-----------+-----------+------------+------------+ Left   1.25       amp        1.18        .64          +-------+-----------+-----------+------------+------------+     Right TBIs appear increased.   Summary: Right: Resting right ankle-brachial index is within normal range. The right toe-brachial index is normal.  Left: Resting left ankle-brachial index is within normal range.  *See table(s)  above for measurements and observations.    Electronically signed by Festus Barren MD on 11/23/2022 at 7:25:33 AM.      Final    Lab Results  Component Value Date   WBC 10.4 03/22/2023   HGB 12.4 (L) 03/22/2023   HCT 38.0 (L) 03/22/2023   MCV 89.8 03/22/2023   PLT 207 03/22/2023   Last metabolic panel Lab Results  Component Value Date   GLUCOSE 140 (H) 03/22/2023   NA 134 (L) 03/22/2023   K 4.2 03/22/2023   CL 97 (L) 03/22/2023   CO2 26 03/22/2023   BUN 49 (H) 03/22/2023   CREATININE 1.98 (H) 03/22/2023   GFRNONAA 38 (L) 03/22/2023   CALCIUM 9.0 03/22/2023   PHOS 3.9 05/27/2022   PROT 7.3 12/09/2022   ALBUMIN 4.2 12/09/2022   BILITOT 0.8 12/09/2022   ALKPHOS 67 12/09/2022   AST 23 12/09/2022   ALT 20 12/09/2022   ANIONGAP 11 03/22/2023    Assessment and Plan: Diabetic foot infection (HCC) Worsening purulent drainage from the right foot in the setting of history of infection requiring amputation. Status post wound debridement by Dr. Excell Seltzer with podiatry in the ER Noted history of diabetic foot infection osteomyelitis requiring operative repair in the past Pending MRI of  the foot to better assess IV cefepime, Flagyl vancomycin for infectious coverage Blood cultures Follow-up imaging Formal wound care consult obtained Follow-up podiatry recommendations  Chronic combined systolic (congestive) and diastolic (congestive) heart failure (HCC) 2D echo 5/23 had shown EF of 45 to 50%  Euvolemic at present  Follow    PAD (peripheral artery disease) (HCC) Stable  Hold Plavix for now pending MRI Follow  Coronary artery disease No active chest pain Continue home regimen  Type 2 diabetes mellitus with diabetic neuropathy, without long-term current use of insulin (HCC) SSI A1c  Hyperlipidemia associated with type 2 diabetes mellitus (HCC) Statin  Stage 3a chronic kidney disease (CKD) (HCC) Creatinine 2 today with GFR in the 30s Baseline creatinine around 1.4-2.1 Monitor      Advance Care Planning:   Code Status: Full Code   Consults: Podiatry   Family Communication: Wife at the bedside   Severity of Illness: The appropriate patient status for this patient is INPATIENT. Inpatient status is judged to be reasonable and necessary in order to provide the required intensity of service to ensure the patient's safety. The patient's presenting symptoms, physical exam findings, and initial radiographic and laboratory data in the context of their chronic comorbidities is felt to place them at high risk for further clinical deterioration. Furthermore, it is not anticipated that the patient will be medically stable for discharge from the hospital within 2 midnights of admission.   * I certify that at the point of admission it is my clinical judgment that the patient will require inpatient hospital care spanning beyond 2 midnights from the point of admission due to high intensity of service, high risk for further deterioration and high frequency of surveillance required.*  Author: Floydene Flock, MD 03/22/2023 3:02 PM  For on call review [email or web address].

## 2023-03-22 NOTE — Assessment & Plan Note (Signed)
?   Statin.

## 2023-03-22 NOTE — Progress Notes (Signed)
PHARMACY -  BRIEF ANTIBIOTIC NOTE   Pharmacy has received consult(s) for diabetic foot infection from an ED provider.  The patient's profile has been reviewed for ht/wt/allergies/indication/available labs.    One time order(s) placed for vancomycin, ceftriaxone, and metronidazole  Further antibiotics/pharmacy consults should be ordered by admitting physician if indicated.                       Thank you for involving pharmacy in this patient's care.   Rockwell Alexandria, PharmD Clinical Pharmacist 03/22/2023 1:25 PM

## 2023-03-22 NOTE — ED Triage Notes (Signed)
Pt comes with c/o right pinkie toe. Pt states he is diabetic. Pt states he noticed it last Thursday. Pt states drainage, dark in coloration

## 2023-03-22 NOTE — Progress Notes (Signed)
Pharmacy Antibiotic Note  Ricardo Brown is a 62 y.o. male admitted on 03/22/2023 with  diabetic foot infection . PMH significant for HTN, CAD, T2DM, and PVD. Presented with right toe wound with increased redness, swelling, and new drainage. Pharmacy has been consulted for vancomycin and cefepime dosing.  Plan: Day #1 of antibiotcs Vancomycin 2000 mg IV load given in ED Start vancomycin 1750 mg IV every 48 hours (eAUC 502.6, Scr 1.98, TBW 97.1 kg (IBW used), Vd 0.5 L/kg) Stop ceftriaxone 2 g IV every 24 hours Start cefepime 2 g IV every 12 hours based on current renal function Continue metronidazole per MD Monitor renal function, clinical status, culture data, and LOT  Height: 5\' 8"  (172.7 cm) Weight: 97.1 kg (214 lb) IBW/kg (Calculated) : 68.4  Temp (24hrs), Avg:98.5 F (36.9 C), Min:98.5 F (36.9 C), Max:98.5 F (36.9 C)  Recent Labs  Lab 03/22/23 1042 03/22/23 1323  WBC 10.4  --   CREATININE 1.98*  --   LATICACIDVEN 1.2 2.0*    Estimated Creatinine Clearance: 44.3 mL/min (A) (by C-G formula based on SCr of 1.98 mg/dL (H)).    Allergies  Allergen Reactions   Lipitor [Atorvastatin] Palpitations    SVT   Sacubitril-Valsartan Other (See Comments)    SVT   Pregabalin Palpitations    SVT    Antimicrobials this admission: Vancomycin 8/19 >>  Ceftriaxone 8/19 >>  Metronidazole 8/19 >>  Dose adjustments this admission: N/A  Microbiology results: 8/19 BCx: pending 8/19 Wound cx: pending  Thank you for involving pharmacy in this patient's care.   Rockwell Alexandria, PharmD Clinical Pharmacist 03/22/2023 2:29 PM

## 2023-03-22 NOTE — Assessment & Plan Note (Signed)
2D echo 5/23 had shown EF of 45 to 50%  Euvolemic at present  Follow

## 2023-03-22 NOTE — Inpatient Diabetes Management (Signed)
Inpatient Diabetes Program Recommendations  AACE/ADA: New Consensus Statement on Inpatient Glycemic Control (2015)  Target Ranges:  Prepandial:   less than 140 mg/dL      Peak postprandial:   less than 180 mg/dL (1-2 hours)      Critically ill patients:  140 - 180 mg/dL   Lab Results  Component Value Date   GLUCAP 157 (H) 10/08/2022   HGBA1C 6.9 (H) 05/27/2022    Review of Glycemic Control  Diabetes history: DM2 Outpatient Diabetes medications: Tresiba 32 units every day, Trulicity 1.5 mg qweek, Metformin 1 gm bid Current orders for Inpatient glycemic control: None  Inpatient Diabetes Program Recommendations:   Received consult regarding admission recommendations. Please consider: -Novolog 0-9 units tid, 0-5 units hs correction -Semglee 16 units q hs (50% home basal dose)  Thank you, Darel Hong E. Demaurion Dicioccio, RN, MSN, CDE  Diabetes Coordinator Inpatient Glycemic Control Team Team Pager [phone] (8am-5pm) 03/22/2023 2:32 PM

## 2023-03-22 NOTE — Assessment & Plan Note (Signed)
SSI A1c 

## 2023-03-22 NOTE — Assessment & Plan Note (Signed)
Stable  Hold Plavix for now pending MRI Follow

## 2023-03-22 NOTE — Consult Note (Signed)
PODIATRY / FOOT AND ANKLE SURGERY CONSULTATION NOTE  Requesting Physician: Dr. Cyril Loosen  Reason for consult: Right foot infection  Chief Complaint: Right foot wound/sore/infection   HPI: Ricardo Brown is a 62 y.o. male who presents with a blister to the right lateral foot near the fifth metatarsal phalangeal joint as well as wound to the right fifth toe.  He notes that it was present a couple of days ago and they have been trying to treat it conservatively but have noticed that it is worsening over the past 24 hours with increased redness and swelling and it started to drain some.  Patient notes no pain to the area.  Patient went to the emergency room due to this issue.  Patient currently denies pain to the area as well as denies nausea, vomiting, fevers, chills.  PMHx:  Past Medical History:  Diagnosis Date   Chronic combined systolic (congestive) and diastolic (congestive) heart failure (HCC)    a. 01/2018 EF 35-40%; b. 10/2020 Echo: EF 40-45%, glob HK. RVSP 42.33mmHg. Mildly dil LA. Mild MS/AoV sclerosis.   CKD (chronic kidney disease), stage Brown (HCC)    Coronary artery disease    a. 01/2018 late presenting inferior MI; b. 01/2018 Cath: Severe multivessel dzs-->CABG x 3 (LIMA->LAD, VG->OM2, VG->RPDA @ [facility]).   Diabetes mellitus without complication (HCC)    a. Dx ~ 2000.   Heart palpitations    a. Pt reports prior nl echo's and stress tests. Last stress test w/in past 2 yrs - PCP.   High cholesterol    Hypertension    Mitral regurgitation    a. 01/2018 s/p MV repair @ time of CABG.   Myocardial infarction [facility])    Neuropathy    legs and hands   Postoperative atrial fibrillation    a. 01/2018 @ time of CABG.   PSVT (paroxysmal supraventricular tachycardia)    a. Very symptomatic with multiple ED evaluations.  Has been on amio.   Severe sepsis with acute organ dysfunction (HCC) 12/19/2021   Sleep apnea    "mild - has CPAP, doesn't use   Sleep difficulties 09/07/2019   STEMI  (ST elevation myocardial infarction) (HCC) 01/24/2018   Wears dentures    full upper and lower    Surgical Hx:  Past Surgical History:  Procedure Laterality Date   AMPUTATION TOE Right 08/29/2021   Procedure: AMPUTATION TOE-2nd toe;  Surgeon: Linus Galas, DPM;  Location: [facility];  Service: Podiatry;  Laterality: Right;   AMPUTATION TOE Left 12/22/2021   Procedure: AMPUTATION TOE-2nd Toe Partial;  Surgeon: Rosetta Posner, DPM;  Location: [facility];  Service: Podiatry;  Laterality: Left;   CARDIAC CATHETERIZATION     CARDIAC VALVE REPLACEMENT     Mitral Valve Repair   CATARACT EXTRACTION W/PHACO Left 10/08/2022   Procedure: CATARACT EXTRACTION PHACO AND INTRAOCULAR LENS PLACEMENT (IOC) LEFT DIABETIC OMIDRIA 20.94 01:35.9;  Surgeon: Estanislado Pandy, MD;  [facility];  Service: Ophthalmology;  Laterality: Left;  Diabetic   COLONOSCOPY WITH PROPOFOL N/A 06/07/2019   Procedure: COLONOSCOPY WITH PROPOFOL;  Surgeon: Pasty Spillers, MD;  Location: [facility];  Service: Endoscopy;  Laterality: N/A;   COLONOSCOPY WITH PROPOFOL N/A 02/07/2020   Procedure: COLONOSCOPY WITH PROPOFOL;  Surgeon: Pasty Spillers, MD;  Location: [facility];  Service: Endoscopy;  Laterality: N/A;   COLONOSCOPY WITH PROPOFOL N/A 12/11/2020   Procedure: COLONOSCOPY WITH PROPOFOL;  Surgeon: Pasty Spillers, MD;  Location: [facility];  Service: Endoscopy;  Laterality: N/A;  CORONARY ANGIOPLASTY     CORONARY ARTERY BYPASS GRAFT     x 3   01/2018   ESOPHAGOGASTRODUODENOSCOPY N/A 02/07/2020   Procedure: ESOPHAGOGASTRODUODENOSCOPY (EGD);  Surgeon: Pasty Spillers, MD;  Location: [facility] ENDOSCOPY;  Service: Endoscopy;  Laterality: N/A;   INCISION AND DRAINAGE Left 06/05/2022   Procedure: INCISION AND DRAINAGE;  Surgeon: Rosetta Posner, DPM;  Location: [facility];  Service: Podiatry;  Laterality: Left;   LEFT HEART CATH AND CORONARY ANGIOGRAPHY N/A 01/24/2018   Procedure: LEFT HEART CATH AND  CORONARY ANGIOGRAPHY;  Surgeon: Iran Ouch, MD;  Location: [facility];  Service: Cardiovascular;  Laterality: N/A;   LOWER EXTREMITY ANGIOGRAPHY Left 06/08/2022   Procedure: Lower Extremity Angiography;  Surgeon: Annice Needy, MD;  Location: [facility];  Service: Cardiovascular;  Laterality: Left;   SVT ABLATION N/A 09/12/2021   Procedure: SVT ABLATION;  Surgeon: Lanier Prude, MD;  Location: [facility] INVASIVE CV LAB;  Service: Cardiovascular;  Laterality: N/A;   TONSILLECTOMY     TRANSMETATARSAL AMPUTATION Left 08/04/2022   Procedure: TRANSMETATARSAL AMPUTATION;  Surgeon: Linus Galas, DPM;  Location: [facility];  Service: Podiatry;  Laterality: Left;    FHx:  Family History  Problem Relation Age of Onset   Cancer Mother        died @ 42   CAD Father        First MI @ 34. S/p heart transplant. Died in mid-50's of cancer.   Cancer Father    Other Brother        alive and well    Social History:  reports that he has never smoked. He has never used smokeless tobacco. He reports current alcohol use. He reports that he does not use drugs.  Allergies:  Allergies  Allergen Reactions   Lipitor [Atorvastatin] Palpitations    SVT   Sacubitril-Valsartan Other (See Comments)    SVT   Pregabalin Palpitations    SVT    (Not in a hospital admission)   Physical Exam: General: Alert and oriented.  No apparent distress.  Vascular: DP/PT pulses +1 bilateral, capillary fill time intact to digits bilaterally, no hair growth present to bilateral lower extremities, mild bilateral lower extremities.  Neuro: Light touch sensation absent to bilateral lower extremities.  Derm: Dermal type ulceration present to the lateral aspect of the right fifth metatarsal phalange joint area, appears to be pretty superficial overall with debridement that was performed, does appear to have a central area of fibrous debris, no bone exposed in this area but does have periwound erythema and edema  present to this area that extends to the midfoot dorsally.  Ulceration also present to the right fifth toe PIPJ dorsal lateral, appears to have some serous drainage, from the area, probes fairly deep but do not feel bone exposed at this time.  MSK: Right second toe amputation, left transmetatarsal amputation  Results for orders placed or performed during the hospital encounter of 03/22/23 (from the past 48 hour(s))  CBC     Status: Abnormal   Collection Time: 03/22/23 10:42 AM  Result Value Ref Range   WBC 10.4 4.0 - 10.5 K/uL   RBC 4.23 4.22 - 5.81 MIL/uL   Hemoglobin 12.4 (L) 13.0 - 17.0 g/dL   HCT 16.1 (L) 09.6 - 04.5 %   MCV 89.8 80.0 - 100.0 fL   MCH 29.3 26.0 - 34.0 pg   MCHC 32.6 30.0 - 36.0 g/dL   RDW 40.9 81.1 - 91.4 %  Platelets 207 150 - 400 K/uL   nRBC 0.0 0.0 - 0.2 %    Comment: Performed at [facility], [address] [place], [place] [address]  Basic metabolic panel     Status: Abnormal   Collection Time: 03/22/23 10:42 AM  Result Value Ref Range   Sodium 134 (L) 135 - 145 mmol/L   Potassium 4.2 3.5 - 5.1 mmol/L   Chloride 97 (L) 98 - 111 mmol/L   CO2 26 22 - 32 mmol/L   Glucose, Bld 140 (H) 70 - 99 mg/dL    Comment: Glucose reference range applies only to samples taken after fasting for at least 8 hours.   BUN 49 (H) 8 - 23 mg/dL   Creatinine, Ser 2.95 (H) 0.61 - 1.24 mg/dL   Calcium 9.0 8.9 - 62.1 mg/dL   GFR, Estimated 38 (L) >60 mL/min    Comment: (NOTE) Calculated using the CKD-EPI Creatinine Equation (2021)    Anion gap 11 5 - 15    Comment: Performed at [facility], [address] [place], [place] [address]  Lactic acid, plasma     Status: None   Collection Time: 03/22/23 10:42 AM  Result Value Ref Range   Lactic Acid, Venous 1.2 0.5 - 1.9 mmol/L    Comment: Performed at [facility], [address] [place], [place] [address]   No results found.  Blood pressure (!) 147/77, pulse 89, temperature 98.5 F (36.9 C),  resp. rate 18, SpO2 100%.  Assessment Cellulitis right lower extremity secondary to diabetic foot ulcerations Diabetes type 2 polyneuropathy PVD  Plan -Patient seen and examined. -X-ray imaging reviewed.  Do not see any evidence of osteomyelitis on x-ray, no gas.  Arterial calcification present in the foot and ankle. -Wound debridements performed as described below, patient tolerated procedure well.  Both wounds appear to be fairly stable at this time but do have concern with the depth of the wound to the right fifth toe. -Applied Betadine wet-to-dry dressing.  Recommend daily changes.  Surgical shoe also ordered. -Wound culture taken, order placed -Appreciate medicine recommendations for antibiotic therapy. -Ordered MRI of right foot without contrast to assess for deeper infection/osteomyelitis.  If no infection is present would recommend IV antibiotics for couple of days likely discharge after that time.  If infection is present may have to consider debridement/amputation. -Will recheck tomorrow  Debridment of ulcer: Location: Right lateral fifth metatarsal phalangeal joint Pre-debridement measurement: 0 Post-debridement measurement: 4 cm x 3 cm by dermis Tissue removed:   Hyperkeratotic tissue, biofilm Ulcer was debrided sharply with combination of tissue nippers and scalpel blade into the dermis.  Debridment of ulcer: Location: Right fifth toe PIPJ dorsal lateral Pre-debridement measurement: 0.1 x 0.1 x 0.4 cm Post-debridement measurement: 0.2 x 0.3 x 0.4 cm Tissue removed:   Hyperkeratotic tissue, biofilm, fibrous tissue Ulcer was debrided sharply with combination of tissue nippers and scalpel blade into the subcutaneous tissue   Rosetta Posner, DPM 03/22/2023, 12:53 PM

## 2023-03-22 NOTE — Consult Note (Signed)
WOC Nurse Consult Note: Consultation for wound on right foot, 5th digit and lateral foot. Podiatric Medicine has been simultaneously consulted, has seen and is now following patient. Dr. Alberteen Spindle has provided orders for Nursing to dress with betadine dressing once daily.  There is no role for WOC nursing at this time.  WOC nursing team will not follow, but will remain available to this patient, the nursing and medical teams.   Thank you for inviting [place] to participate in this patient's Plan of Care.  Ladona Mow, MSN, RN, CNS, GNP, Leda Min, [employer], [employer] phone:  [phone]

## 2023-03-22 NOTE — Plan of Care (Signed)

## 2023-03-22 NOTE — Assessment & Plan Note (Signed)
No active chest pain Continue home regimen  

## 2023-03-23 DIAGNOSIS — I739 Peripheral vascular disease, unspecified: Secondary | ICD-10-CM | POA: Diagnosis not present

## 2023-03-23 DIAGNOSIS — E11628 Type 2 diabetes mellitus with other skin complications: Secondary | ICD-10-CM | POA: Diagnosis not present

## 2023-03-23 DIAGNOSIS — L089 Local infection of the skin and subcutaneous tissue, unspecified: Secondary | ICD-10-CM | POA: Diagnosis not present

## 2023-03-23 LAB — CBC
HCT: 31.9 % — ABNORMAL LOW (ref 39.0–52.0)
Hemoglobin: 10.7 g/dL — ABNORMAL LOW (ref 13.0–17.0)
MCH: 29.2 pg (ref 26.0–34.0)
MCHC: 33.5 g/dL (ref 30.0–36.0)
MCV: 87.2 fL (ref 80.0–100.0)
Platelets: 175 10*3/uL (ref 150–400)
RBC: 3.66 MIL/uL — ABNORMAL LOW (ref 4.22–5.81)
RDW: 14.3 % (ref 11.5–15.5)
WBC: 7.7 10*3/uL (ref 4.0–10.5)
nRBC: 0 % (ref 0.0–0.2)

## 2023-03-23 LAB — COMPREHENSIVE METABOLIC PANEL
ALT: 15 U/L (ref 0–44)
AST: 16 U/L (ref 15–41)
Albumin: 3.3 g/dL — ABNORMAL LOW (ref 3.5–5.0)
Alkaline Phosphatase: 44 U/L (ref 38–126)
Anion gap: 10 (ref 5–15)
BUN: 37 mg/dL — ABNORMAL HIGH (ref 8–23)
CO2: 26 mmol/L (ref 22–32)
Calcium: 8.1 mg/dL — ABNORMAL LOW (ref 8.9–10.3)
Chloride: 101 mmol/L (ref 98–111)
Creatinine, Ser: 1.58 mg/dL — ABNORMAL HIGH (ref 0.61–1.24)
GFR, Estimated: 49 mL/min — ABNORMAL LOW (ref >60)
Glucose, Bld: 151 mg/dL — ABNORMAL HIGH (ref 70–99)
Potassium: 3.9 mmol/L (ref 3.5–5.1)
Sodium: 137 mmol/L (ref 135–145)
Total Bilirubin: 0.8 mg/dL (ref 0.3–1.2)
Total Protein: 6.4 g/dL — ABNORMAL LOW (ref 6.5–8.1)

## 2023-03-23 LAB — GLUCOSE, CAPILLARY
Glucose-Capillary: 178 mg/dL — ABNORMAL HIGH (ref 70–99)
Glucose-Capillary: 154 mg/dL — ABNORMAL HIGH (ref 70–99)
Glucose-Capillary: 150 mg/dL — ABNORMAL HIGH (ref 70–99)
Glucose-Capillary: 158 mg/dL — ABNORMAL HIGH (ref 70–99)

## 2023-03-23 LAB — HEMOGLOBIN A1C
Hgb A1c MFr Bld: 7.1 % — ABNORMAL HIGH (ref 4.8–5.6)
Mean Plasma Glucose: 157.07 mg/dL

## 2023-03-23 MED ORDER — PRAMIPEXOLE DIHYDROCHLORIDE 0.25 MG PO TABS
1.5000 mg | ORAL_TABLET | Freq: Every day | ORAL | Status: DC
  Administered 2023-03-23: 1.5 mg via ORAL
  Filled 2023-03-23: qty 6

## 2023-03-23 MED ORDER — PRAMIPEXOLE DIHYDROCHLORIDE 0.25 MG PO TABS: 1.5000 mg | ORAL_TABLET | Freq: Every day | ORAL | Status: DC

## 2023-03-23 MED ORDER — VANCOMYCIN HCL IN DEXTROSE 1-5 GM/200ML-% IV SOLN
1000.0000 mg | INTRAVENOUS | Status: DC
  Administered 2023-03-23: 1000 mg via INTRAVENOUS
  Filled 2023-03-23 (×2): qty 200

## 2023-03-23 MED ORDER — VITAMIN C 500 MG PO TABS
500.0000 mg | ORAL_TABLET | Freq: Two times a day (BID) | ORAL | Status: DC
  Administered 2023-03-23 – 2023-03-24 (×2): 500 mg via ORAL
  Filled 2023-03-23 (×2): qty 1

## 2023-03-23 MED ORDER — ADULT MULTIVITAMIN W/MINERALS CH
1.0000 | ORAL_TABLET | Freq: Every day | ORAL | Status: DC
  Administered 2023-03-24: 1 via ORAL
  Filled 2023-03-23: qty 1

## 2023-03-23 MED ORDER — ENSURE MAX PROTEIN PO LIQD
11.0000 [oz_av] | Freq: Every day | ORAL | Status: DC
  Administered 2023-03-23 – 2023-03-24 (×2): 11 [oz_av] via ORAL
  Filled 2023-03-23: qty 330

## 2023-03-23 NOTE — Consult Note (Signed)
Hospital Consult    Reason for Consult:  Right Foot Ulcer Requesting Physician:  Dr Doree Albee MD  MRN #:  [id number]  History of Present Illness: This is a 62 y.o. male with medical history significant of multiple medical issues including HFrEF, stage III CKD, CAD, type 2 diabetes, hyperlipidemia, hypertension, mitral regurgitation, CAD, neuropathy, sleep apnea, history of diabetic foot infection status post amputation presenting with right diabetic foot infection without osteomyelitis.  Patient currently being  followed by outpatient podiatry.  Has had worsening purulent drainage over multiple days involving the lateral aspect of the right foot.   Vascular surgery was consulted by podiatry for the patient's known history of PVD mostly in his left lower extremity with complications on the right at this time.  Evaluation to be done to determine blood flow to the right lower extremity for possible surgery and healing purposes.    Past Medical History:  Diagnosis Date   Chronic combined systolic (congestive) and diastolic (congestive) heart failure (HCC)    a. 01/2018 EF 35-40%; b. 10/2020 Echo: EF 40-45%, glob HK. RVSP 42.46mmHg. Mildly dil LA. Mild MS/AoV sclerosis.   CKD (chronic kidney disease), stage III (HCC)    Coronary artery disease    a. 01/2018 late presenting inferior MI; b. 01/2018 Cath: Severe multivessel dzs-->CABG x 3 (LIMA->LAD, VG->OM2, VG->RPDA @ [facility]).   Diabetes mellitus without complication (HCC)    a. Dx ~ 2000.   Heart palpitations    a. Pt reports prior nl echo's and stress tests. Last stress test w/in past 2 yrs - PCP.   High cholesterol    Hypertension    Mitral regurgitation    a. 01/2018 s/p MV repair @ time of CABG.   Myocardial infarction [facility])    Neuropathy    legs and hands   Postoperative atrial fibrillation    a. 01/2018 @ time of CABG.   PSVT (paroxysmal supraventricular tachycardia)    a. Very symptomatic with multiple ED evaluations.  Has been on  amio.   Severe sepsis with acute organ dysfunction (HCC) 12/19/2021   Sleep apnea    "mild - has CPAP, doesn't use   Sleep difficulties 09/07/2019   STEMI (ST elevation myocardial infarction) (HCC) 01/24/2018   Wears dentures    full upper and lower    Past Surgical History:  Procedure Laterality Date   AMPUTATION TOE Right 08/29/2021   Procedure: AMPUTATION TOE-2nd toe;  Surgeon: Linus Galas, DPM;  Location: [facility] ORS;  Service: Podiatry;  Laterality: Right;   AMPUTATION TOE Left 12/22/2021   Procedure: AMPUTATION TOE-2nd Toe Partial;  Surgeon: Rosetta Posner, DPM;  Location: [facility] OR[facility];  Service: Podiatry;  Laterality: Left;   CARDIAC CATHETERIZATION     CARDIAC VALVE REPLACEMENT     Mitral Valve Repair   CATARACT EXTRACTION W/PHACO Left 10/08/2022   Procedure: CATARACT EXTRACTION PHACO AND INTRAOCULAR LENS PLACEMENT (IOC) LEFT DIABETIC OMIDRIA 20.94 01:35.9;  Surgeon: Estanislado Pandy, MD;  Location: [facility];  Service: Ophthalmology;  Laterality: Left;  Diabetic   COLONOSCOPY WITH PROPOFOL N/A 06/07/2019   Procedure: COLONOSCOPY WITH PROPOFOL;  Surgeon: Pasty Spillers, MD;  Location: [facility] ENDOSCOPY;  Service: Endoscopy;  Laterality: N/A;   COLONOSCOPY WITH PROPOFOL N/A 02/07/2020   Procedure: COLONOSCOPY WITH PROPOFOL;  Surgeon: Pasty Spillers, MD;  Location: [facility] ENDOS[facility];  Service: Endoscopy;  Laterality: N/A;   COLONOSCOPY WITH PROPOFOL N/A 12/11/2020   Procedure: COLONOSCOPY WITH PROPOFOL;  Surgeon: Pasty Spillers, MD;  Location: [facility] ENDOSCOPY;  Service: Endoscopy;  Laterality: N/A;   CORONARY ANGIOPLASTY     CORONARY ARTERY BYPASS GRAFT     x 3   01/2018   ESOPHAGOGASTRODUODENOSCOPY N/A 02/07/2020   Procedure: ESOPHAGOGASTRODUODENOSCOPY (EGD);  Surgeon: Pasty Spillers, MD;  Location: [facility] ENDOSCOPY;  Service: Endoscopy;  Laterality: N/A;   INCISION AND DRAINAGE Left 06/05/2022   Procedure: INCISION AND DRAINAGE;  Surgeon: Rosetta Posner, DPM;   Location: [facility];  Service: Podiatry;  Laterality: Left;   LEFT HEART CATH AND CORONARY ANGIOGRAPHY N/A 01/24/2018   Procedure: LEFT HEART CATH AND CORONARY ANGIOGRAPHY;  Surgeon: Iran Ouch, MD;  Location: [facility] INVASIVE CV LAB;  Service: Cardiovascular;  Laterality: N/A;   LOWER EXTREMITY ANGIOGRAPHY Left 06/08/2022   Procedure: Lower Extremity Angiography;  Surgeon: Annice Needy, MD;  Location: [facility] INVASIVE CV LAB;  Service: Cardiovascular;  Laterality: Left;   SVT ABLATION N/A 09/12/2021   Procedure: SVT ABLATION;  Surgeon: Lanier Prude, MD;  Location: [facility] INVASIVE CV LAB;  Service: Cardiovascular;  Laterality: N/A;   TONSILLECTOMY     TRANSMETATARSAL AMPUTATION Left 08/04/2022   Procedure: TRANSMETATARSAL AMPUTATION;  Surgeon: Linus Galas, DPM;  Location: [facility];  Service: Podiatry;  Laterality: Left;    Allergies  Allergen Reactions   Lipitor [Atorvastatin] Palpitations    SVT   Sacubitril-Valsartan Other (See Comments)    SVT   Pregabalin Palpitations    SVT    Prior to Admission medications   Medication Sig Start Date End Date Taking? Authorizing Provider  amiodarone (PACERONE) 200 MG tablet Take 1 tablet (200 mg total) by mouth daily. 11/16/22  Yes Agbor-Etang, Arlys John, MD  ascorbic acid (VITAMIN C) 500 MG tablet Take 1 tablet (500 mg total) by mouth 2 (two) times daily. 06/09/22  Yes Rai, Ripudeep K, MD  b complex vitamins tablet Take 1 tablet by mouth daily.    Yes [provider]  clopidogrel (PLAVIX) 75 MG tablet Take 1 tablet (75 mg total) by mouth daily. 11/23/22  Yes Georgiana Spinner, NP  dapagliflozin propanediol (FARXIGA) 10 MG TABS tablet Take 1 tablet (10 mg total) by mouth daily before breakfast. 12/09/22  Yes Laurey Morale, MD  Dulaglutide (TRULICITY) 1.5 MG/0.5ML SOPN Inject 1.5 mg into the skin once a week.   Yes [provider]  gabapentin (NEURONTIN) 100 MG capsule Take 1 capsule (100 mg total) by mouth 2 (two) times daily. 02/17/23  Yes  Merita Norton T, FNP  insulin degludec (TRESIBA) 200 UNIT/ML FlexTouch Pen 32 Units at bedtime.   Yes [provider]  losartan (COZAAR) 25 MG tablet Take 1 tablet (25 mg total) by mouth daily. 02/10/23  Yes Debbe Odea, MD  metFORMIN (GLUCOPHAGE) 500 MG tablet Take 2 tablets (1,000 mg total) by mouth 2 (two) times daily. 10/23/21  Yes Kara Dies, NP  metolazone (ZAROXOLYN) 5 MG tablet Take 1 tablet (5 mg total) by mouth once a week. On Monday, hold if wt 215 lbs or less 12/17/22  Yes Bensimhon, Bevelyn Buckles, MD  metoprolol succinate (TOPROL-XL) 50 MG 24 hr tablet TAKE 1 TABLET BY MOUTH DAILY. TAKE WITH OR IMMEDIATELY FOLLOWING A MEAL. 06/29/22  Yes Masoud, Renda Rolls, MD  Omega-3 Fatty Acids (FISH OIL) 875 MG CAPS Take 875 mg by mouth 2 (two) times daily.   Yes [provider]  pramipexole (MIRAPEX) 1.5 MG tablet Take 1 tablet (1.5 mg total) by mouth 2 (two) times daily. 01/13/23  Yes Jacky Kindle, FNP  rosuvastatin (CRESTOR) 20 MG  tablet Take 1 tablet (20 mg total) by mouth daily. 02/01/23 05/02/23 Yes Agbor-Etang, Arlys John, MD  spironolactone (ALDACTONE) 25 MG tablet Take 0.5 tablets (12.5 mg total) by mouth daily. 12/30/22  Yes Laurey Morale, MD  torsemide [facility]) 20 MG tablet Take 60mg  in the morning and 40mg  in the evening 12/30/22  Yes Laurey Morale, MD  aspirin 81 MG EC tablet Take 81 mg by mouth daily.     [provider]    Social History   Socioeconomic History   Marital status: Married    Spouse name: Not on file   Number of children: Not on file   Years of education: Not on file   Highest education level: Not on file  Occupational History   Not on file  Tobacco Use   Smoking status: Never   Smokeless tobacco: Never  Vaping Use   Vaping status: Never Used  Substance and Sexual Activity   Alcohol use: Yes    Comment: rare beer   Drug use: No   Sexual activity: Not on file  Other Topics Concern   Not on file  Social History Narrative   Lives  locally with wife.  Works in [employer].  Does not routinely exercise.   Social Determinants of Health   Financial Resource Strain: Not on file  Food Insecurity: No Food Insecurity (03/22/2023)   Hunger Vital Sign    Worried About Running Out of Food in the Last Year: Never true    Ran Out of Food in the Last Year: Never true  Transportation Needs: No Transportation Needs (03/22/2023)   PRAPARE - Administrator, Civil Service (Medical): No    Lack of Transportation (Non-Medical): No  Physical Activity: Inactive (01/27/2018)   [facility], [facility]   Exercise Vital Sign    Days of Exercise per Week: 0 days    Minutes of Exercise per Session: 0 min  Stress: Not on file  Social Connections: Not on file  Intimate Partner Violence: Not At Risk (03/22/2023)   Humiliation, Afraid, Rape, and Kick questionnaire    Fear of Current or Ex-Partner: No    Emotionally Abused: No    Physically Abused: No    Sexually Abused: No     Family History  Problem Relation Age of Onset   Cancer Mother        died @ 71   CAD Father        First MI @ 61. S/p heart transplant. Died in mid-50's of cancer.   Cancer Father    Other Brother        alive and well    ROS: Otherwise negative unless mentioned in HPI  Physical Examination  Vitals:   03/22/23 1900 03/23/23 0358  BP: 133/72 115/66  Pulse: 74 71  Resp: 16 18  Temp: 98 F (36.7 C) 98.3 F (36.8 C)  SpO2: 100% 98%   Body mass index is 32.54 kg/m.  General:  WDWN in NAD Gait: Not observed HENT: WNL, normocephalic Pulmonary: normal non-labored breathing, without Rales, rhonchi,  wheezing Cardiac: regular, without  Murmurs, rubs or gallops; without carotid bruits Abdomen: Positive bowel sounds throughout, soft, NT/ND, no masses Skin: without rashes Vascular Exam/Pulses: Palpable upper extremity pulses.  Unable to palpate lower extremity pulses.  Bilateral lower  extremities remain warm to touch. Extremities: without ischemic changes, without Gangrene , without cellulitis; with open wounds;  Musculoskeletal: no muscle wasting or atrophy  Neurologic: A&O X 3;  No focal weakness or paresthesias are detected; speech is fluent/normal Psychiatric:  The pt has Normal affect. Lymph:  Unremarkable  CBC    Component Value Date/Time   WBC 7.7 03/23/2023 0517   RBC 3.66 (L) 03/23/2023 0517   HGB 10.7 (L) 03/23/2023 0517   HGB 13.0 08/20/2021 0632   HCT 31.9 (L) 03/23/2023 0517   HCT 38.0 08/20/2021 0632   PLT 175 03/23/2023 0517   PLT 197 08/20/2021 0632   MCV 87.2 03/23/2023 0517   MCV 89 08/20/2021 0632   MCH 29.2 03/23/2023 0517   MCHC 33.5 03/23/2023 0517   RDW 14.3 03/23/2023 0517   RDW 12.9 08/20/2021 0632   LYMPHSABS 1.4 07/30/2022 1435   LYMPHSABS 0.9 08/20/2021 0632   MONOABS 0.7 07/30/2022 1435   EOSABS 0.2 07/30/2022 1435   EOSABS 0.1 08/20/2021 0632   BASOSABS 0.1 07/30/2022 1435   BASOSABS 0.1 08/20/2021 0632    BMET    Component Value Date/Time   NA 137 03/23/2023 0517   NA 136 08/26/2021 0952   K 3.9 03/23/2023 0517   CL 101 03/23/2023 0517   CO2 26 03/23/2023 0517   GLUCOSE 151 (H) 03/23/2023 0517   BUN 37 (H) 03/23/2023 0517   BUN 69 (H) 08/26/2021 0952   CREATININE 1.58 (H) 03/23/2023 0517   CREATININE 1.31 09/30/2020 1114   CALCIUM 8.1 (L) 03/23/2023 0517   GFRNONAA 49 (L) 03/23/2023 0517   GFRNONAA 59 (L) 09/30/2020 1114   GFRAA 69 09/30/2020 1114    COAGS: Lab Results  Component Value Date   INR 1.2 05/27/2022   INR 1.3 (H) 12/20/2021   INR 1.1 12/19/2021     Non-Invasive Vascular Imaging:   VAS [place] ABI WITH/WO TBI  LOWER EXTREMITY DOPPLER STUDY   Patient Name:  Arnette Norris  Date of Exam:   11/19/2022 Medical Rec #: [id number]            Accession #:    [id number] Date of Birth: May 01, 1961           Patient Gender: M Patient Age:   69 years Exam Location:  [facility] Procedure:       VAS [place] ABI WITH/WO TBI Referring Phys:   --------------------------------------------------------------------------------   Indications: Peripheral artery disease. all left toes amputated    Vascular Interventions: Percutaneous transluminal angioplasty of left posterior                         tibial artery and tibioperoneal trunk with 3 mm diameter                         angioplasty balloon                         5. Percutaneous transluminal angioplasty of the left                         tibioperoneal trunk with 4 mm diam Lutonix drug-coated                         angioplasty                         6. Percutaneous transluminal angioplasty of the left  anterior tibial artery with 3 mm angioplasty balloon.   Performing Technologist: Salvadore Farber RVT     Examination Guidelines: A complete evaluation includes at minimum, Doppler waveform signals and systolic blood pressure reading at the level of bilateral brachial, anterior tibial, and posterior tibial arteries, when vessel segments are accessible. Bilateral testing is considered an integral part of a complete examination. Photoelectric Plethysmograph (PPG) waveforms and toe systolic pressure readings are included as required and additional duplex testing as needed. Limited examinations for reoccurring indications may be performed as noted.     ABI Findings: +---------+------------------+-----+---------+--------+ Right    Rt Pressure (mmHg)IndexWaveform Comment  +---------+------------------+-----+---------+--------+ Brachial 133                                      +---------+------------------+-----+---------+--------+ ATA      136               1.02 biphasic          +---------+------------------+-----+---------+--------+ PTA      170               1.28 triphasic         +---------+------------------+-----+---------+--------+ Great Toe93                0.70 Normal             +---------+------------------+-----+---------+--------+   +----+------------------+-----+---------+-------+ LeftLt Pressure (mmHg)IndexWaveform Comment +----+------------------+-----+---------+-------+ ATA 153               1.15 triphasic        +----+------------------+-----+---------+-------+ PTA 166               1.25 triphasic        +----+------------------+-----+---------+-------+   +-------+-----------+-----------+------------+------------+ ABI/TBIToday's ABIToday's TBIPrevious ABIPrevious TBI +-------+-----------+-----------+------------+------------+ Right  1.28       .70        1.27        .50          +-------+-----------+-----------+------------+------------+ Left   1.25       amp        1.18        .64          +-------+-----------+-----------+------------+------------+       Right TBIs appear increased.   Summary: Right: Resting right ankle-brachial index is within normal range. The right toe-brachial index is normal.   Left: Resting left ankle-brachial index is within normal range.  Statin:  Yes.   Beta Blocker:  Yes.   Aspirin:  Yes.   ACEI:  No. ARB:  No. CCB use:  No Other antiplatelets/anticoagulants:  Yes.   Plavix 75 mg Daily   ASSESSMENT/PLAN: This is a 62 y.o. male who presents to [facility] emergency department for worsening purulent drainage from his right foot.  Patient noted to have a history of diabetic foot infections with osteomyelitis requiring operative repair in the past.  Vascular surgery was consulted to evaluate blood flow to his right lower extremity at this time.  I discussed in detail today with the patient the procedure, benefits, risks, and complications.  Patient verbalizes understanding and endorses he has had the procedure on his left lower extremity prior.  I answered all the patient's questions today.  He would like to proceed as soon as possible.  Patient remains on IV antibiotics at this  time.  Patient will be made n.p.o. midnight before the day of his procedure.  Patient is currently  scheduled for a right lower extremity angiogram for Wednesday, 03/24/2023 later in the day.   -I discussed the plans in detail with Dr. Festus Barren MD and he is in agreement with the plan.   Marcie Bal [facility] 03/23/2023 7:42 AM

## 2023-03-23 NOTE — Progress Notes (Addendum)
Progress Note   Patient: Ricardo Brown:[id number] DOB: 1961-01-29 DOA: 03/22/2023     1 DOS: the patient was seen and examined on 03/23/2023     Subjective:  Patient seen and examined at bedside this morning Patient underwent debridement of the right foOT by podiatry yesterday He denies nausea vomiting abdominal pain or chest pain Vascular surgery have been consulted for angiography Vascular surgeon planning on angiography tomorrow   Brief hospital course: From HPI "Ricardo Brown is a 62 y.o. male with medical history significant of multiple medical issues including HFrEF, stage Brown CKD, CAD, type 2 diabetes, hyperlipidemia, hypertension, mitral regurgitation, CAD, neuropathy, sleep apnea, history of diabetic foot infection status post amputation presenting with diabetic foot infection and?  Osteomyelitis.  Patient does be followed by outpatient podiatry.  Has had worsening purulent drainage over multiple days involving the lateral aspect of the right foot.  No fevers or chills.  Patient concerned about purulent drainage and subsequently came to the ER for further evaluation.  Noted baseline history of diabetic foot infection with noted transmetatarsal amputation of the left foot January 2024, as well as second toe partial amputation of the right foot May 2023.  Patient reports blood sugars usually in the 140s to 150s.  No reported alcohol or tobacco use.  Has been compliant with home medication regimen including antiplatelets and diuretics. Presented to the ER afebrile, hemodynamically stable.  Creatinine 2, glucose 140.  White count 10.4, hemoglobin 12.4, platelets 207.  Lactate 2.  Right foot plain films and MRI pending.  "  Assessment and Plan: Diabetic foot infection (HCC) s/p debridement Patient presents with worsening purulent drainage from the right foot in the setting of history of infection requiring amputation Patient is currently status post wound debridement  by Dr. Excell Seltzer with podiatry in the ER on 03/22/2023 Noted history of diabetic foot infection osteomyelitis requiring operative repair in the past MRI of the right foot obtained yesterday did not show any findings of active osteomyelitis.  However it did show some findings of cellulitis Continue IV cefepime, Flagyl vancomycin for infectious coverage Follow-up on blood cultures Wound care consulted Plan of care discussed with vascular surgery as well as podiatry Vascular surgery planning angiography tomorrow   Chronic combined systolic (congestive) and diastolic (congestive) heart failure (HCC) 2D echo 5/23 had shown EF of 45 to 50%  Euvolemic at present  Monitor input and output Daily weighing Continue home dose of metolazone, losartan, metoprolol, spironolactone, torsemide Patient follow-up with cardiology   PAD (peripheral artery disease) (HCC) Stable  Continue Lovenox, clopidogrel and rosuvastatin   Coronary artery disease No active pains Continue above cardioprotective agents   Type 2 diabetes mellitus with diabetic neuropathy, without long-term current use of insulin (HCC) Monitor glucose closely Continue current insulin therapy   Hyperlipidemia associated with type 2 diabetes mellitus (HCC) Continue rosuvastatin   AKI on stage 3a chronic kidney disease (CKD) (HCC) Baseline creatinine around 1.4-2.1 Monitor renal function closely      Advance Care Planning:   Code Status: Full Code    Consults: Podiatry, vascular surgery   Family Communication: Discussed with wife at the bedside     Physical Exam:   Appearance: He is normal weight.  HENT:     Head: Normocephalic.     Mouth/Throat:     Mouth: Mucous membranes are moist.  Eyes:     Pupils: Pupils are equal, round, and reactive to light.  Cardiovascular:     Rate and  Rhythm: Normal rate and regular rhythm.  Pulmonary:     Effort: Pulmonary effort is normal.  Abdominal:     General: Bowel sounds are normal.   Musculoskeletal:    Foot dressing appears clean and dry Neurological:     General: No focal deficit present.  Psychiatric:        Mood and Affect: Mood normal  Vitals:   03/22/23 1511 03/22/23 1900 03/23/23 0358 03/23/23 0858  BP: 133/66 133/72 115/66 121/65  Pulse: 73 74 71 78  Resp: 16 16 18 18   Temp: 97.9 F (36.6 C) 98 F (36.7 C) 98.3 F (36.8 C) 98.3 F (36.8 C)  TempSrc: Oral  Oral Oral  SpO2: 99% 100% 98% 97%  Weight: 97.1 kg     Height: 5\' 8"  (1.727 m)       Data Reviewed:I have reviewed patient's MRI of the right foot, CBC, CMP, podiatry documentation, vascular surgeon documentation vitals as well as previous chart reviewed  Total time spent: I spent a total of 57 minutes reviewing patient's chart, looking at patient's x-ray, MRI, discussing plan of care with podiatry as well as vascular surgeon  and nursing staff  Author: Loyce Dys, MD 03/23/2023 2:22 PM  For on call review [email or web address].

## 2023-03-23 NOTE — Discharge Instructions (Signed)
Right foot dressing changes: Cleanse wound superficially with saline.  Apply Betadine dressing with gauze wrap. Minimize ambulation in postop shoe only. Follow-up with Dr. Excell Seltzer in 1 week.

## 2023-03-23 NOTE — TOC CM/SW Note (Signed)
Transition of Care [facility]) - Inpatient Brief Assessment   Patient Details  Name: HOLLY MAUCH MRN: [id number] Date of Birth: April 30, 1961  [facility] of [facility]) CM/SW Contact:    Chapman Fitch, RN Phone Number: 03/23/2023, 2:08 PM   Clinical Narrative:   Per podiatry patient for angio  tomorrow Patient to complete his own dressing changes at discharge and transition to oral antibiotics at discharge Patient listed as a level 6 mobility.   Please consult TOC if needs arise  Transition of Care Asessment: Insurance and Status: Insurance coverage has been reviewed Patient has primary care physician: Yes     Prior/Current Home Services: No current home services Social Determinants of Health Reivew: SDOH reviewed no interventions necessary Readmission risk has been reviewed: Yes Transition of care needs: no transition of care needs at this time

## 2023-03-23 NOTE — Progress Notes (Signed)
Daily Progress Note   Subjective  - * No surgery date entered *  Follow-up right foot superficial skin necrosis.  MRI was negative.  Patient seen by vascular surgery.  They are planning on angio tomorrow.  Objective Vitals:   03/22/23 1511 03/22/23 1900 03/23/23 0358 03/23/23 0858  BP: 133/66 133/72 115/66 121/65  Pulse: 73 74 71 78  Resp: 16 16 18 18   Temp: 97.9 F (36.6 C) 98 F (36.7 C) 98.3 F (36.8 C) 98.3 F (36.8 C)  TempSrc: Oral  Oral Oral  SpO2: 99% 100% 98% 97%  Weight: 97.1 kg     Height: 5\' 8"  (1.727 m)       Physical Exam: Dorsal lateral foot with areas of superficial skin necrosis with surrounding granular tissue.  No purulence.  Mild surrounding cellulitis.  Laboratory CBC    Component Value Date/Time   WBC 7.7 03/23/2023 0517   HGB 10.7 (L) 03/23/2023 0517   HGB 13.0 08/20/2021 0632   HCT 31.9 (L) 03/23/2023 0517   HCT 38.0 08/20/2021 0632   PLT 175 03/23/2023 0517   PLT 197 08/20/2021 0632    BMET    Component Value Date/Time   NA 137 03/23/2023 0517   NA 136 08/26/2021 0952   K 3.9 03/23/2023 0517   CL 101 03/23/2023 0517   CO2 26 03/23/2023 0517   GLUCOSE 151 (H) 03/23/2023 0517   BUN 37 (H) 03/23/2023 0517   BUN 69 (H) 08/26/2021 0952   CREATININE 1.58 (H) 03/23/2023 0517   CREATININE 1.31 09/30/2020 1114   CALCIUM 8.1 (L) 03/23/2023 0517   GFRNONAA 49 (L) 03/23/2023 0517   GFRNONAA 59 (L) 09/30/2020 1114   GFRAA 69 09/30/2020 1114    Assessment/Planning: Peripheral vascular disease with superficial skin necrosis dorsal lateral right foot  Dressing changed today. Patient for angio tomorrow. No plan for surgical debridement or amputation at this time.  MRI is negative.  Skin necrosis seems to be superficial and likely managed with local wound care outpatient.  Betadine dressing changes are being performed at this time.  I have encouraged patient to perform these outpatient.  Orders will be provided for discharge dressing changes with  Betadine dressing with gauze and gauze wrap.  Patient feels comfortable to perform these. Continue to minimize ambulation.  Avoid any direct pressure to this area. Follow-up with Dr. Excell Seltzer in 1 week.  From podiatry standpoint after angio patient stable for discharge.  Recommend p.o. antibiotics for 7 days upon discharge.  Gwyneth Revels A  03/23/2023, 12:10 PM

## 2023-03-23 NOTE — Progress Notes (Signed)
Initial Nutrition Assessment  DOCUMENTATION CODES:   Obesity unspecified  INTERVENTION:   Ensure Max protein supplement po daily, each supplement provides 150kcal and 30g of protein.  Vitamin C 500mg  po BID  MVI po daily   Daily weights   Diabetic diet education   NUTRITION DIAGNOSIS:   Increased nutrient needs related to wound healing as evidenced by estimated needs.  GOAL:   Patient will meet greater than or equal to 90% of their needs  MONITOR:   Supplement acceptance, Labs, Weight trends, I & O's, PO intake, Skin  REASON FOR ASSESSMENT:   Consult Wound healing  ASSESSMENT:   62 y/o male with h/o CAD s/p CABG, CHF, PAD, CKD III, HLD, DM, HTN and OSA who is admitted with diabetic foot wound.  Met with pt and pt's wife in room today. Pt reports good appetite and oral intake pta and in hospital; pt eating 100% of meals. RD discussed with pt the importance of adequate nutrition needed to preserve lean muscle and to support wound healing. Pt is willing to drink chocolate supplements in hospital. RD will add supplements and vitamins to help pt meet his estimated needs and to support wound healing. Per chart, pt appears to be down 24lbs(10%) over the past 4 months. Pt has been struggling with edema and volume overload over the past several months; RD suspects weight loss is in relation to fluid changes as pt reports his UBW is ~209lbs.   Pt provided with diabetic diet education today.   Medications reviewed and include: plavix, lovenox, insulin, aldactone, torsemide, NaCl @75ml /hr, cefepime, metronidazole, vancomycin   Labs reviewed: K 3.9 wnl, BUN 37(H), creat 1.58(H) Hgb 10.7(L), Hct 31.9(L) Cbgs- 154, 178 x 24 hrs  AIC 7.1(H)- 8/19  NUTRITION - FOCUSED PHYSICAL EXAM:  Flowsheet Row Most Recent Value  Orbital Region No depletion  Upper Arm Region No depletion  Thoracic and Lumbar Region No depletion  Buccal Region No depletion  Temple Region No depletion   Clavicle Bone Region No depletion  Clavicle and Acromion Bone Region No depletion  Scapular Bone Region No depletion  Dorsal Hand No depletion  Patellar Region No depletion  Anterior Thigh Region No depletion  Posterior Calf Region No depletion  Edema (RD Assessment) None  Hair Reviewed  Eyes Reviewed  Mouth Reviewed  Skin Reviewed  Nails Reviewed   Diet Order:   Diet Order             Diet heart healthy/carb modified Room service appropriate? Yes; Fluid consistency: Thin  Diet effective now                  EDUCATION NEEDS:   Education needs have been addressed  Skin:  Skin Assessment: Reviewed RN Assessment (diabetic foot wound)  Last BM:  8/19  Height:   Ht Readings from Last 1 Encounters:  03/22/23 5\' 8"  (1.727 m)    Weight:   Wt Readings from Last 1 Encounters:  03/22/23 97.1 kg    Ideal Body Weight:  70 kg  BMI:  Body mass index is 32.54 kg/m.  Estimated Nutritional Needs:   Kcal:  1900-2200kcal/day  Protein:  95-110g/day  Fluid:  1.8-2.1L/day  Betsey Holiday MS, RD, LDN Please refer to [facility] for RD and/or RD on-call/weekend/after hours pager

## 2023-03-23 NOTE — Progress Notes (Signed)
Pharmacy Antibiotic Note  Ricardo Brown is a 62 y.o. male admitted on 03/22/2023 with  diabetic foot infection . PMH significant for HTN, CAD, T2DM, and PVD. Presented with right toe wound with increased redness, swelling, and new drainage. Pharmacy has been consulted for vancomycin and cefepime dosing.  Day #2 antibiotics. Renal function improving.   Plan: Day #1 of antibiotcs Adjust vancomycin 1000 mg IV every 24 hours Goal AUC 400-550 Estimated AUC 470, Cmin 11.6 Used Scr 1.58, TBW 97.1 kg (IBW used), Vd 0.5 L/kg  Continue cefepime 2 g IV every 12 hours based on current renal function Continue metronidazole 500 mg IV every 12 hours  Monitor renal function, clinical status, culture data, and LOT  Height: 5\' 8"  (172.7 cm) Weight: 97.1 kg (214 lb) IBW/kg (Calculated) : 68.4  Temp (24hrs), Avg:98.2 F (36.8 C), Min:98 F (36.7 C), Max:98.3 F (36.8 C)  Recent Labs  Lab 03/22/23 1042 03/22/23 1323 03/22/23 1512 03/22/23 1726 03/23/23 0517  WBC 10.4  --   --   --  7.7  CREATININE 1.98*  --   --   --  1.58*  LATICACIDVEN 1.2 2.0* 1.7 1.8  --     Estimated Creatinine Clearance: 55.5 mL/min (A) (by C-G formula based on SCr of 1.58 mg/dL (H)).    Allergies  Allergen Reactions   Lipitor [Atorvastatin] Palpitations    SVT   Sacubitril-Valsartan Other (See Comments)    SVT   Pregabalin Palpitations    SVT    Antimicrobials this admission: Vancomycin 8/19 >>  Ceftriaxone 8/19 >>  8/19 Cefepime 8/20 >> Metronidazole 8/19 >>  Dose adjustments this admission: N/A  Microbiology results: 8/19 BCx: NG < 24 hours 8/19 Wound cx: too young to read Thank you for involving pharmacy in this patient's care.   Elliot Gurney, PharmD, BCPS Clinical Pharmacist  03/23/2023 3:20 PM

## 2023-03-24 ENCOUNTER — Encounter: Payer: 59 | Admitting: Family

## 2023-03-24 ENCOUNTER — Encounter
Admission: EM | Disposition: A | Discharge status: Home / Self Care | Payer: Self-pay | Source: Home / Self Care | Attending: Internal Medicine

## 2023-03-24 DIAGNOSIS — I70235 Atherosclerosis of native arteries of right leg with ulceration of other part of foot: Secondary | ICD-10-CM

## 2023-03-24 DIAGNOSIS — L97519 Non-pressure chronic ulcer of other part of right foot with unspecified severity: Secondary | ICD-10-CM

## 2023-03-24 DIAGNOSIS — E11628 Type 2 diabetes mellitus with other skin complications: Secondary | ICD-10-CM | POA: Diagnosis not present

## 2023-03-24 DIAGNOSIS — L089 Local infection of the skin and subcutaneous tissue, unspecified: Secondary | ICD-10-CM | POA: Diagnosis not present

## 2023-03-24 HISTORY — PX: LOWER EXTREMITY INTERVENTION: CATH118252

## 2023-03-24 LAB — CBC WITH DIFFERENTIAL/PLATELET
Abs Immature Granulocytes: 0.05 10*3/uL (ref 0.00–0.07)
Basophils Absolute: 0 10*3/uL (ref 0.0–0.1)
Basophils Relative: 1 %
Eosinophils Absolute: 0.3 10*3/uL (ref 0.0–0.5)
Eosinophils Relative: 4 %
HCT: 33.7 % — ABNORMAL LOW (ref 39.0–52.0)
Hemoglobin: 11.4 g/dL — ABNORMAL LOW (ref 13.0–17.0)
Immature Granulocytes: 1 %
Lymphocytes Relative: 20 %
Lymphs Abs: 1.3 10*3/uL (ref 0.7–4.0)
MCH: 29.3 pg (ref 26.0–34.0)
MCHC: 33.8 g/dL (ref 30.0–36.0)
MCV: 86.6 fL (ref 80.0–100.0)
Monocytes Absolute: 0.6 10*3/uL (ref 0.1–1.0)
Monocytes Relative: 8 %
Neutro Abs: 4.5 10*3/uL (ref 1.7–7.7)
Neutrophils Relative %: 66 %
Platelets: 188 10*3/uL (ref 150–400)
RBC: 3.89 MIL/uL — ABNORMAL LOW (ref 4.22–5.81)
RDW: 14 % (ref 11.5–15.5)
WBC: 6.7 10*3/uL (ref 4.0–10.5)
nRBC: 0 % (ref 0.0–0.2)

## 2023-03-24 LAB — GLUCOSE, CAPILLARY
Glucose-Capillary: 183 mg/dL — ABNORMAL HIGH (ref 70–99)
Glucose-Capillary: 175 mg/dL — ABNORMAL HIGH (ref 70–99)
Glucose-Capillary: 145 mg/dL — ABNORMAL HIGH (ref 70–99)

## 2023-03-24 LAB — BASIC METABOLIC PANEL
Anion gap: 6 (ref 5–15)
BUN: 33 mg/dL — ABNORMAL HIGH (ref 8–23)
CO2: 29 mmol/L (ref 22–32)
Calcium: 8.3 mg/dL — ABNORMAL LOW (ref 8.9–10.3)
Chloride: 102 mmol/L (ref 98–111)
Creatinine, Ser: 1.51 mg/dL — ABNORMAL HIGH (ref 0.61–1.24)
GFR, Estimated: 52 mL/min — ABNORMAL LOW (ref >60)
Glucose, Bld: 159 mg/dL — ABNORMAL HIGH (ref 70–99)
Potassium: 3.8 mmol/L (ref 3.5–5.1)
Sodium: 137 mmol/L (ref 135–145)

## 2023-03-24 SURGERY — LOWER EXTREMITY INTERVENTION
Anesthesia: Moderate Sedation | Laterality: Right

## 2023-03-24 MED ORDER — HYDROMORPHONE HCL 1 MG/ML IJ SOLN: 1.0000 mg | Freq: Once | INTRAMUSCULAR | Status: DC | PRN

## 2023-03-24 MED ORDER — PRAMIPEXOLE DIHYDROCHLORIDE 1.5 MG PO TABS: 1.5000 mg | ORAL_TABLET | Freq: Every day | ORAL | Status: AC

## 2023-03-24 MED ORDER — FENTANYL CITRATE (PF) 100 MCG/2ML IJ SOLN
INTRAMUSCULAR | Status: AC
  Filled 2023-03-24: qty 2

## 2023-03-24 MED ORDER — DOXYCYCLINE HYCLATE 100 MG PO TABS: 100.0000 mg | ORAL_TABLET | Freq: Two times a day (BID) | ORAL | 0 refills | Status: DC

## 2023-03-24 MED ORDER — CEFAZOLIN SODIUM-DEXTROSE 2-4 GM/100ML-% IV SOLN
INTRAVENOUS | Status: AC
  Filled 2023-03-24: qty 100

## 2023-03-24 MED ORDER — SODIUM CHLORIDE 0.9 % IV SOLN: INTRAVENOUS | Status: DC

## 2023-03-24 MED ORDER — DIPHENHYDRAMINE HCL 50 MG/ML IJ SOLN: 50.0000 mg | Freq: Once | INTRAMUSCULAR | Status: DC | PRN

## 2023-03-24 MED ORDER — OXYCODONE-ACETAMINOPHEN 5-325 MG PO TABS: 1.0000 | ORAL_TABLET | Freq: Three times a day (TID) | ORAL | 0 refills | Status: AC | PRN

## 2023-03-24 MED ORDER — AMOXICILLIN-POT CLAVULANATE 500-125 MG PO TABS: 1.0000 | ORAL_TABLET | Freq: Two times a day (BID) | ORAL | 0 refills | Status: DC

## 2023-03-24 MED ORDER — ASPIRIN 81 MG PO TBEC
81.0000 mg | DELAYED_RELEASE_TABLET | Freq: Every day | ORAL | Status: DC
  Administered 2023-03-24: 81 mg via ORAL
  Filled 2023-03-24: qty 1

## 2023-03-24 MED ORDER — METHYLPREDNISOLONE SODIUM SUCC 125 MG IJ SOLR: 125.0000 mg | Freq: Once | INTRAMUSCULAR | Status: DC | PRN

## 2023-03-24 MED ORDER — ONDANSETRON HCL 4 MG/2ML IJ SOLN: 4.0000 mg | Freq: Four times a day (QID) | INTRAMUSCULAR | Status: DC | PRN

## 2023-03-24 MED ORDER — FENTANYL CITRATE (PF) 100 MCG/2ML IJ SOLN
INTRAMUSCULAR | Status: DC | PRN
  Administered 2023-03-24: 25 ug via INTRAVENOUS
  Administered 2023-03-24: 50 ug via INTRAVENOUS

## 2023-03-24 MED ORDER — MIDAZOLAM HCL 5 MG/5ML IJ SOLN
INTRAMUSCULAR | Status: AC
  Filled 2023-03-24: qty 5

## 2023-03-24 MED ORDER — FAMOTIDINE 20 MG PO TABS: 40.0000 mg | ORAL_TABLET | Freq: Once | ORAL | Status: DC | PRN

## 2023-03-24 MED ORDER — MIDAZOLAM HCL 2 MG/2ML IJ SOLN
INTRAMUSCULAR | Status: DC | PRN
  Administered 2023-03-24: .5 mg via INTRAVENOUS
  Administered 2023-03-24: 1 mg via INTRAVENOUS

## 2023-03-24 MED ORDER — HEPARIN SODIUM (PORCINE) 1000 UNIT/ML IJ SOLN
INTRAMUSCULAR | Status: AC
  Filled 2023-03-24: qty 10

## 2023-03-24 MED ORDER — FENTANYL CITRATE PF 50 MCG/ML IJ SOSY: 12.5000 ug | PREFILLED_SYRINGE | Freq: Once | INTRAMUSCULAR | Status: DC | PRN

## 2023-03-24 MED ORDER — MIDAZOLAM HCL 2 MG/ML PO SYRP: 8.0000 mg | ORAL_SOLUTION | Freq: Once | ORAL | Status: DC | PRN

## 2023-03-24 MED ORDER — CEFAZOLIN SODIUM-DEXTROSE 2-4 GM/100ML-% IV SOLN
2.0000 g | INTRAVENOUS | Status: AC
  Administered 2023-03-24: 2 g via INTRAVENOUS

## 2023-03-24 MED ORDER — HEPARIN SODIUM (PORCINE) 1000 UNIT/ML IJ SOLN
INTRAMUSCULAR | Status: DC | PRN
  Administered 2023-03-24: 5000 [IU] via INTRAVENOUS

## 2023-03-24 SURGICAL SUPPLY — 20 items
BALLN LUTONIX 018 4X80X130 (BALLOONS) ×1
BALLN ULTRVRSE 2.5X300X150 (BALLOONS) ×1
BALLN ULTRVRSE 3.5X100X150 (BALLOONS) ×1
BALLOON LUTONIX 018 4X80X130 (BALLOONS) IMPLANT
BALLOON ULTRVRSE 2.5X300X150 (BALLOONS) IMPLANT
BALLOON ULTRVRSE 3.5X100X150 (BALLOONS) IMPLANT
CATH ANGIO 5F PIGTAIL 65CM (CATHETERS) IMPLANT
CATH VERT 5X100 (CATHETERS) IMPLANT
COVER PROBE ULTRASOUND 5X96 (MISCELLANEOUS) IMPLANT
DEVICE STARCLOSE SE CLOSURE (Vascular Products) IMPLANT
DRAPE FEMORAL ANGIO W/ POUCH (DRAPES) IMPLANT
GLIDEWIRE ADV .035X260CM (WIRE) IMPLANT
KIT ENCORE 26 ADVANTAGE (KITS) IMPLANT
PACK ANGIOGRAPHY (CUSTOM PROCEDURE TRAY) IMPLANT
SHEATH BRITE TIP 5FRX11 (SHEATH) IMPLANT
SHEATH RAABE 6FRX70 (SHEATH) IMPLANT
SYR MEDRAD MARK 7 150ML (SYRINGE) IMPLANT
TUBING CONTRAST HIGH PRESS 72 (TUBING) IMPLANT
WIRE G V18X300CM (WIRE) IMPLANT
WIRE GUIDERIGHT .035X150 (WIRE) IMPLANT

## 2023-03-24 NOTE — Plan of Care (Signed)
IV removed, discharge instructions reviewed and patient discharged to home

## 2023-03-24 NOTE — Discharge Summary (Signed)
Physician Discharge Summary   Patient: Ricardo [name] MRN: [id number]  DOB: January 31, 1961   Admit:     Date of Admission: 03/22/2023 Admitted from: home   Discharge: Date of discharge: 03/24/23 Disposition: Home Condition at discharge: good  CODE STATUS: FULL CODE     Discharge Physician: Sunnie Nielsen, DO [facility]     PCP: Jacky Kindle, FNP  Recommendations for Outpatient Follow-up:  Follow up with PCP Jacky Kindle, FNP in 2-4 weeks Follow w/ podiatry in 1 week Follow w/ vascular surgery in 3 weeks   Discharge Instructions     Call MD for:  redness, tenderness, or signs of infection (pain, swelling, redness, odor or green/yellow discharge around incision site)   Complete by: As directed    Call MD for:  temperature >100.4   Complete by: As directed    Diet - low sodium heart healthy   Complete by: As directed    Discharge wound care:   Complete by: As directed    Betadine dressing changed as directed   Increase activity slowly   Complete by: As directed          Discharge Diagnoses: Active Problems:   Diabetic foot infection (HCC)   Chronic combined systolic (congestive) and diastolic (congestive) heart failure (HCC)   PAD (peripheral artery disease) (HCC)   Coronary artery disease   Stage 3a chronic kidney disease (CKD) (HCC)   Hyperlipidemia associated with type 2 diabetes mellitus (HCC)   Type 2 diabetes mellitus with diabetic neuropathy, without long-term current use of insulin [facility])       Hospital Course: Ricardo [name] is a 62 y.o. male with medical history significant of multiple medical issues including HFrEF, stage [name] CKD, CAD, type 2 diabetes, hyperlipidemia, hypertension, mitral regurgitation, CAD, neuropathy, sleep apnea, history of diabetic foot infection status post amputation presenting with concern for diabetic foot infection. Hx of diabetic foot infection with noted transmetatarsal amputation of  the left foot January 2024, as well as second toe partial amputation of the right foot May 2023.  08/19: admitted to hospitalist service, no osteomyelitis on MRI, wound debrided by podiatry 08/20: per vascular surgery, plan angio tomorrow   Consultants:  Podiatry  Vascular surgery   Procedures: 03/22/23 wound debridement by Dr. Excell Seltzer with podiatry in the ER 03/24/23 angiography w/ balloon angioplasties R anterior tibia;, right tibioperoneal trunk and proximal posterior tibial artery       ASSESSMENT & PLAN:   Diabetic foot infection  S/p debridement Cellulitis R/o Osteomyelitis w/ MRI  Continue IV cefepime, Flagyl vancomycin for infectious coverage Follow-up on blood cultures Per podiatry - No plan for surgical debridement or amputation at this time. Skin necrosis seems to be superficial and likely managed with local wound care outpatient. Betadine dressing changes recommended. Follow w/ Dr Excell Seltzer in 1 week post discharge. From podiatry standpoint after angio patient stable for discharge. Recommend p.o. antibiotics for 7 days upon discharge.  Vascular surgery angiography today went well, follow outpatient    PAD (peripheral artery disease)  Continue Lovenox, clopidogrel and rosuvastatin  Chronic combined systolic (congestive) and diastolic (congestive) heart failure (HCC) Coronary artery disease 2D echo 5/23 had shown EF of 45 to 50%  Euvolemic at present  Monitor input and output Daily weighing Continue home dose of metolazone, losartan, metoprolol, spironolactone, torsemide Patient follow-up with cardiology   Type 2 diabetes mellitus with diabetic neuropathy, without long-term current use of insulin (HCC) Monitor glucose closely Continue  current insulin therapy   Hyperlipidemia associated with type 2 diabetes mellitus (HCC) Continue rosuvastatin   AKI on stage 3a chronic kidney disease (CKD) (HCC) Baseline creatinine around 1.4-2.1 Monitor renal function  closely           Discharge Instructions  Allergies as of 03/24/2023       Reactions   Lipitor [atorvastatin] Palpitations   SVT   Sacubitril-valsartan Other (See Comments)   SVT   Pregabalin Palpitations   SVT        Medication List     TAKE these medications    amiodarone 200 MG tablet Commonly known as: PACERONE Take 1 tablet (200 mg total) by mouth daily.   amoxicillin-clavulanate 500-125 MG tablet Commonly known as: AUGMENTIN Take 1 tablet by mouth 2 (two) times daily. Start taking on: March 25, 2023   ascorbic acid 500 MG tablet Commonly known as: VITAMIN C Take 1 tablet (500 mg total) by mouth 2 (two) times daily.   aspirin EC 81 MG tablet Take 81 mg by mouth daily.   b complex vitamins tablet Take 1 tablet by mouth daily.   clopidogrel 75 MG tablet Commonly known as: PLAVIX Take 1 tablet (75 mg total) by mouth daily.   dapagliflozin propanediol 10 MG Tabs tablet Commonly known as: Farxiga Take 1 tablet (10 mg total) by mouth daily before breakfast.   doxycycline 100 MG tablet Commonly known as: VIBRA-TABS Take 1 tablet (100 mg total) by mouth 2 (two) times daily. Start taking on: March 25, 2023   Fish Oil 875 MG Caps Take 875 mg by mouth 2 (two) times daily.   gabapentin 100 MG capsule Commonly known as: NEURONTIN Take 1 capsule (100 mg total) by mouth 2 (two) times daily.   insulin degludec 200 UNIT/ML FlexTouch Pen Commonly known as: TRESIBA 32 Units at bedtime.   losartan 25 MG tablet Commonly known as: COZAAR Take 1 tablet (25 mg total) by mouth daily.   metFORMIN 500 MG tablet Commonly known as: GLUCOPHAGE Take 2 tablets (1,000 mg total) by mouth 2 (two) times daily.   metolazone 5 MG tablet Commonly known as: ZAROXOLYN Take 1 tablet (5 mg total) by mouth once a week. On Monday, hold if wt 215 lbs or less   metoprolol succinate 50 MG 24 hr tablet Commonly known as: TOPROL-XL TAKE 1 TABLET BY MOUTH DAILY. TAKE WITH  OR IMMEDIATELY FOLLOWING A MEAL.   oxyCODONE-acetaminophen 5-325 MG tablet Commonly known as: PERCOCET/ROXICET Take 1 tablet by mouth every 8 (eight) hours as needed for up to 5 days for severe pain.   pramipexole 1.5 MG tablet Commonly known as: MIRAPEX Take 1 tablet (1.5 mg total) by mouth daily.   rosuvastatin 20 MG tablet Commonly known as: CRESTOR Take 1 tablet (20 mg total) by mouth daily.   spironolactone 25 MG tablet Commonly known as: ALDACTONE Take 0.5 tablets (12.5 mg total) by mouth daily.   torsemide 20 MG tablet Commonly known as: DEMADEX Take 60mg  in the morning and 40mg  in the evening   Trulicity 1.5 MG/0.5ML Sopn Generic drug: Dulaglutide Inject 1.5 mg into the skin once a week.               Discharge Care Instructions  (From admission, onward)           Start     Ordered   03/24/23 0000  Discharge wound care:       Comments: Betadine dressing changed as directed   03/24/23 1600  Follow-up Information     Dew, Marlow Baars, MD Follow up in 3 week(s).   Specialties: Vascular Surgery, Radiology, Interventional Cardiology Why: See JD/FB with ABI in 3 weeks Contact information: [address] [place] [address] [phone]                 Allergies  Allergen Reactions   Lipitor [Atorvastatin] Palpitations    SVT   Sacubitril-Valsartan Other (See Comments)    SVT   Pregabalin Palpitations    SVT     Subjective: pt feeling well after procedure, no concerns, pain controlled    Discharge Exam: BP 113/65 (BP Location: Left Arm)   Pulse 72   Temp 98.7 F (37.1 C) (Oral)   Resp 18   Ht 5\' 8"  (1.727 m)   Wt 97.8 kg   SpO2 96%   BMI 32.78 kg/m  General: Pt is alert, awake, not in acute distress Cardiovascular: RRR, S1/S2 +, no rubs, no gallops Respiratory: CTA bilaterally, no wheezing, no rhonchi Abdominal: Soft, NT, ND, bowel sounds + Extremities: no edema, no cyanosis. RLE wrapped,  visible toes are warm and good active ROM     The results of significant diagnostics from this hospitalization (including imaging, microbiology, ancillary and laboratory) are listed below for reference.     Microbiology: Recent Results (from the past 240 hour(s))  Blood culture (routine x 2)     Status: None (Preliminary result)   Collection Time: 03/22/23 10:42 AM   Specimen: BLOOD RIGHT ARM  Result Value Ref Range Status   Specimen Description BLOOD RIGHT ARM  Final   Special Requests   Final    BOTTLES DRAWN AEROBIC AND ANAEROBIC Blood Culture adequate volume   Culture   Final    NO GROWTH 2 DAYS Performed at [facility], [address] [place], [place] [address]    Report Status PENDING  Incomplete  Blood culture (routine x 2)     Status: None (Preliminary result)   Collection Time: 03/22/23  1:23 PM   Specimen: BLOOD RIGHT ARM  Result Value Ref Range Status   Specimen Description BLOOD RIGHT ARM forearm  Final   Special Requests   Final    BOTTLES DRAWN AEROBIC AND ANAEROBIC Blood Culture adequate volume   Culture   Final    NO GROWTH 2 DAYS Performed at [facility], [address], [place], [place] [address]    Report Status PENDING  Incomplete  Aerobic/Anaerobic Culture w Gram Stain (surgical/deep wound)     Status: None (Preliminary result)   Collection Time: 03/22/23  1:23 PM   Specimen: Foot; Wound  Result Value Ref Range Status   Specimen Description   Final    FOOT RIGHT Performed at [facility], [address] [place], [place] [address]    Special Requests   Final    NONE Performed at [facility], [address] [place], [place] [address]    Gram Stain   Final    NO WBC SEEN FEW GRAM POSITIVE COCCI IN CHAINS Performed at [facility], [address] [place], [place] [address]    Culture   Final    MODERATE GROUP B STREP(S.AGALACTIAE)ISOLATED FEW STAPHYLOCOCCUS AUREUS RARE KLEBSIELLA  AEROGENES TESTING AGAINST S. AGALACTIAE NOT ROUTINELY PERFORMED DUE TO PREDICTABILITY OF AMP/PEN/VAN SUSCEPTIBILITY. NO ANAEROBES ISOLATED; CULTURE IN PROGRESS FOR 5 DAYS    Report Status PENDING  Incomplete     Labs: BNP (last 3  results) Recent Labs    12/09/22 1210 12/17/22 1126 12/30/22 1103  BNP 229.4* 96.6 81.5   Basic Metabolic Panel: Recent Labs  Lab 03/22/23 1042 03/23/23 0517 03/24/23 0455  NA 134* 137 137  K 4.2 3.9 3.8  CL 97* 101 102  CO2 26 26 29   GLUCOSE 140* 151* 159*  BUN 49* 37* 33*  CREATININE 1.98* 1.58* 1.51*  CALCIUM 9.0 8.1* 8.3*   Liver Function Tests: Recent Labs  Lab 03/23/23 0517  AST 16  ALT 15  ALKPHOS 44  BILITOT 0.8  PROT 6.4*  ALBUMIN 3.3*   No results for input(s): "LIPASE", "AMYLASE" in the last 168 hours. No results for input(s): "AMMONIA" in the last 168 hours. CBC: Recent Labs  Lab 03/22/23 1042 03/23/23 0517 03/24/23 0455  WBC 10.4 7.7 6.7  NEUTROABS  --   --  4.5  HGB 12.4* 10.7* 11.4*  HCT 38.0* 31.9* 33.7*  MCV 89.8 87.2 86.6  PLT 207 175 188   Cardiac Enzymes: No results for input(s): "CKTOTAL", "CKMB", "CKMBINDEX", "TROPONINI" in the last 168 hours. BNP: Invalid input(s): "POCBNP" CBG: Recent Labs  Lab 03/23/23 1636 03/23/23 2137 03/24/23 0752 03/24/23 0930 03/24/23 1219  GLUCAP 150* 158* 183* 175* 145*   D-Dimer No results for input(s): "DDIMER" in the last 72 hours. Hgb A1c Recent Labs    03/22/23 1512  HGBA1C 7.1*   Lipid Profile No results for input(s): "CHOL", "HDL", "LDLCALC", "TRIG", "CHOLHDL", "LDLDIRECT" in the last 72 hours. Thyroid function studies No results for input(s): "TSH", "T4TOTAL", "T3FREE", "THYROIDAB" in the last 72 hours.  Invalid input(s): "FREET3" Anemia work up No results for input(s): "VITAMINB12", "FOLATE", "FERRITIN", "TIBC", "IRON", "RETICCTPCT" in the last 72 hours. Urinalysis    Component Value Date/Time   COLORURINE YELLOW (A) 05/28/2022 0226    APPEARANCEUR CLEAR (A) 05/28/2022 0226   LABSPEC 1.014 05/28/2022 0226   PHURINE 6.0 05/28/2022 0226   GLUCOSEU >=500 (A) 05/28/2022 0226   HGBUR NEGATIVE 05/28/2022 0226   BILIRUBINUR NEGATIVE 05/28/2022 0226   KETONESUR NEGATIVE 05/28/2022 0226   PROTEINUR NEGATIVE 05/28/2022 0226   NITRITE NEGATIVE 05/28/2022 0226   LEUKOCYTESUR NEGATIVE 05/28/2022 0226   Sepsis Labs Recent Labs  Lab 03/22/23 1042 03/23/23 0517 03/24/23 0455  WBC 10.4 7.7 6.7   Microbiology Recent Results (from the past 240 hour(s))  Blood culture (routine x 2)     Status: None (Preliminary result)   Collection Time: 03/22/23 10:42 AM   Specimen: BLOOD RIGHT ARM  Result Value Ref Range Status   Specimen Description BLOOD RIGHT ARM  Final   Special Requests   Final    BOTTLES DRAWN AEROBIC AND ANAEROBIC Blood Culture adequate volume   Culture   Final    NO GROWTH 2 DAYS Performed at [facility], [address], [place], [place] [address]    Report Status PENDING  Incomplete  Blood culture (routine x 2)     Status: None (Preliminary result)   Collection Time: 03/22/23  1:23 PM   Specimen: BLOOD RIGHT ARM  Result Value Ref Range Status   Specimen Description BLOOD RIGHT ARM forearm  Final   Special Requests   Final    BOTTLES DRAWN AEROBIC AND ANAEROBIC Blood Culture adequate volume   Culture   Final    NO GROWTH 2 DAYS Performed at [facility], [address] [place], [place] [address]    Report Status PENDING  Incomplete  Aerobic/Anaerobic Culture w Gram Stain (surgical/deep wound)  Status: None (Preliminary result)   Collection Time: 03/22/23  1:23 PM   Specimen: Foot; Wound  Result Value Ref Range Status   Specimen Description   Final    FOOT RIGHT Performed at [facility], [address], [place], [place] [address]    Special Requests   Final    NONE Performed at [facility], [address] [place], [place] [address]    Gram Stain   Final     NO WBC SEEN FEW GRAM POSITIVE COCCI IN CHAINS Performed at [facility], [address], [place], [place] [address]    Culture   Final    MODERATE GROUP B STREP(S.AGALACTIAE)ISOLATED FEW STAPHYLOCOCCUS AUREUS RARE KLEBSIELLA AEROGENES TESTING AGAINST S. AGALACTIAE NOT ROUTINELY PERFORMED DUE TO PREDICTABILITY OF AMP/PEN/VAN SUSCEPTIBILITY. NO ANAEROBES ISOLATED; CULTURE IN PROGRESS FOR 5 DAYS    Report Status PENDING  Incomplete   Imaging MR FOOT RIGHT WO CONTRAST  Result Date: 03/22/2023 CLINICAL DATA:  Diabetic foot swelling EXAM: MRI OF THE RIGHT FOREFOOT WITHOUT CONTRAST TECHNIQUE: Multiplanar, multisequence MR imaging of the right forefoot was performed. No intravenous contrast was administered. COMPARISON:  Radiographs 03/22/2023 FINDINGS: Despite efforts by the technologist and patient, motion artifact is present on today's exam and could not be eliminated. This reduces exam sensitivity and specificity. Bones/Joint/Cartilage Prior second toe amputation the level of the MTP joint. Arthropathy at the Lisfranc joint with associated spurring and foci of subcortical marrow edema. There is subcortical marrow edema medially in the base of the second metatarsal but this appears more related to arthropathy between the second metatarsal base and the medial cuneiform rather than due to a Lisfranc ligament failure. Also some arthropathy between the middle cuneiform and the base of the second metatarsal. No malalignment at the Lisfranc joint. Several geodes are present along the Lisfranc joint, most notably in the base of the first digit metatarsal. Bifid medial sesamoid of the first digit, with trace endosteal edema reason the possibility of low-grade sesamoiditis. No compelling findings of active osteomyelitis. Ligaments Lisfranc ligament appears intact. Muscles and Tendons Moderate regional muscular atrophy. Distal peroneus longus tendinopathy. Soft tissues Subcutaneous edema tracks along the  dorsum of the foot, cellulitis not excluded. Reportedly the patient has a draining blister in the vicinity of the fifth MTP joint and small toe, there is some cutaneous thickening and irregularity in this region for example on image 24 series 5 and potentially a small amount of ulceration, but no abscess or underlying osteomyelitis is observed. No joint effusion noted. IMPRESSION: 1. No findings of active osteomyelitis. 2. Subcutaneous edema tracks along the dorsum of the foot, cellulitis not excluded. 3. Arthropathy at the Lisfranc joint with associated spurring and foci of subcortical marrow edema. 4. Bifid medial sesamoid of the first digit, with trace endosteal edema reason the possibility of low-grade sesamoiditis. 5. Moderate regional muscular atrophy. 6. Distal peroneus longus tendinopathy. Electronically Signed   By: Gaylyn Rong M.D.   On: 03/22/2023 16:28   DG Foot Complete Right  Result Date: 03/22/2023 CLINICAL DATA:  Infection EXAM: RIGHT FOOT COMPLETE - 3+ VIEW COMPARISON:  01/13/2017 FINDINGS: Interval amputation of the right second digit. Normal alignment. No acute fracture or dislocation. No erosions. Cortical thickening and dystrophic calcification involving the proximal aspects of the opposing third and fourth metatarsals likely relates to remote trauma or infection. Mild midfoot degenerative arthritis. Small plantar calcaneal spur. Advanced vascular calcifications noted. Soft tissue swelling involving the plantar forefoot subjacent to the metatarsophalangeal joints.  IMPRESSION: 1. Soft tissue swelling. No radiographic evidence of osteomyelitis. 2. Interval amputation of the right second digit. Electronically Signed   By: Helyn Numbers M.D.   On: 03/22/2023 15:19      Time coordinating discharge: over 30 minutes  SIGNED:  Sunnie Nielsen DO [facility]

## 2023-03-24 NOTE — Op Note (Signed)
[facility]  Percutaneous Study/Intervention Procedural Note   Date of Surgery: 03/24/2023  Surgeon(s):Kham Zuckerman    Assistants:none  Pre-operative Diagnosis: PAD with ulceration RLE  Post-operative diagnosis:  Same  Procedure(s) Performed:             1.  Ultrasound guidance for vascular access left femoral artery             2.  Catheter placement into right SFA from left femoral approach             3.  Aortogram and selective right lower extremity angiogram             4.  Percutaneous transluminal angioplasty of right anterior tibial artery with 3 and 3.5 mm balloon             5.  Percutaneous transluminal angioplasty of right tibioperoneal trunk and proximal posterior tibial artery with 4 mm diameter Lutonix drug-coated angioplasty balloon  6.  StarClose closure device left femoral artery  EBL: 5 cc  Contrast: 50 cc  Fluoro Time: 3.6 minutes  Moderate Conscious Sedation Time: approximately 30 minutes using 1.5 mg of Versed and 75 mcg of Fentanyl              Indications:  Patient is a 62 y.o.male with nonhealing ulceration of the right foot. The patient is brought in for angiography for further evaluation and potential treatment.  Due to the limb threatening nature of the situation, angiogram was performed for attempted limb salvage. The patient is aware that if the procedure fails, amputation would be expected.  The patient also understands that even with successful revascularization, amputation may still be required due to the severity of the situation.  Risks and benefits are discussed and informed consent is obtained.   Procedure:  The patient was identified and appropriate procedural time out was performed.  The patient was then placed supine on the table and prepped and draped in the usual sterile fashion. Moderate conscious sedation was administered during a face to face encounter with the patient throughout the procedure with my supervision of the  RN administering medicines and monitoring the patient's vital signs, pulse oximetry, telemetry and mental status throughout from the start of the procedure until the patient was taken to the recovery room. Ultrasound was used to evaluate the left common femoral artery.  It was patent .  A digital ultrasound image was acquired.  A Seldinger needle was used to access the left common femoral artery under direct ultrasound guidance and a permanent image was performed.  A 0.035 J wire was advanced without resistance and a 5Fr sheath was placed.  Pigtail catheter was placed into the aorta and an AP aortogram was performed. This demonstrated normal renal arteries and normal aorta and iliac segments without significant stenosis. I then crossed the aortic bifurcation and advanced to the right femoral head. Selective right lower extremity angiogram was then performed. This demonstrated fairly normal common femoral artery, profunda femoris artery, and superficial femoral artery.  Popliteal artery was mildly irregular but this did not appear to be hemodynamically significant.  There was a typical tibial trifurcation with severe tibial level disease.  The posterior tibial artery was the dominant runoff into the foot and was a very large vessel but there was a high-grade stenosis in the tibioperoneal trunk extending down into the proximal portions of both the peroneal and the posterior tibial arteries.  This was greater than 80%.  The anterior tibial artery  did provide a second runoff vessel distally, but it had greater than 80% stenosis in the proximal segment, greater than 80% stenosis in the proximal to mid segment, and at least 70% stenosis in the mid segment before normalizing distally. It was felt that it was in the patient's best interest to proceed with intervention after these images to avoid a second procedure and a larger amount of contrast and fluoroscopy based off of the findings from the initial angiogram. The  patient was systemically heparinized and a 6 French 70 cm sheath was then placed over the Terumo Advantage wire. I then used a Kumpe catheter and the advantage wire to navigate into the anterior tibial artery then exchanged for a V18 wire across the stenosis and parked the wire in the foot.  Angioplasty was then performed throughout with a 3 mm diameter by 30 cm length angioplasty balloon to encompass all the lesions down to the mid to distal anterior tibial artery not beyond the origin.  It was inflated to 8 atm for 1 minute.  There was still greater than 50% residual stenosis in the proximal segment and upsized to a 3.5 mm diameter by 10 cm length angioplasty balloon and inflated this to 8 atm for 1 minute.  Completion imaging showed less than 30% residual stenosis throughout the anterior tibial artery with markedly improved flow distally.  I then turned my attention to the tibioperoneal trunk and proximal posterior tibial artery.  I used a V18 wire to cross this lesion without difficulty parking the wire in the foot.  A 4 mm diameter by 8 cm length Lutonix drug-coated angioplasty balloon was inflated in the tibioperoneal trunk and proximal posterior tibial artery and inflated up to 6 atm for 1 minute.  Completion imaging showed marked improvement with only about a 10 to 15% residual stenosis. I elected to terminate the procedure. The sheath was removed and StarClose closure device was deployed in the left femoral artery with excellent hemostatic result. The patient was taken to the recovery room in stable condition having tolerated the procedure well.  Findings:               Aortogram:  This demonstrated normal renal arteries and normal aorta and iliac segments without significant stenosis.             Right Lower Extremity:  This demonstrated fairly normal common femoral artery, profunda femoris artery, and superficial femoral artery.  Popliteal artery was mildly irregular but this did not appear to be  hemodynamically significant.  There was a typical tibial trifurcation with severe tibial level disease.  The posterior tibial artery was the dominant runoff into the foot and was a very large vessel but there was a high-grade stenosis in the tibioperoneal trunk extending down into the proximal portions of both the peroneal and the posterior tibial arteries.  This was greater than 80%.  The anterior tibial artery did provide a second runoff vessel distally, but it had greater than 80% stenosis in the proximal segment, greater than 80% stenosis in the proximal to mid segment, and at least 70% stenosis in the mid segment before normalizing distally.   Disposition: Patient was taken to the recovery room in stable condition having tolerated the procedure well.  Complications: None  Festus Barren 03/24/2023 10:43 AM   This note was created with Dragon Medical transcription system. Any errors in dictation are purely unintentional.

## 2023-03-24 NOTE — Hospital Course (Addendum)
Ricardo [name] is a 62 y.o. male with medical history significant of multiple medical issues including HFrEF, stage [name] CKD, CAD, type 2 diabetes, hyperlipidemia, hypertension, mitral regurgitation, CAD, neuropathy, sleep apnea, history of diabetic foot infection status post amputation presenting with concern for diabetic foot infection. Hx of diabetic foot infection with noted transmetatarsal amputation of the left foot January 2024, as well as second toe partial amputation of the right foot May 2023.  08/19: admitted to hospitalist service, no osteomyelitis on MRI, wound debrided by podiatry 08/20: per vascular surgery, plan angio tomorrow   Consultants:  Podiatry  Vascular surgery   Procedures: 03/22/23 wound debridement by Dr. Excell Seltzer with podiatry in the ER 03/24/23 angiography w/ balloon angioplasties R anterior tibia;, right tibioperoneal trunk and proximal posterior tibial artery       ASSESSMENT & PLAN:   Active Problems:   Diabetic foot infection (HCC)   Chronic combined systolic (congestive) and diastolic (congestive) heart failure (HCC)   PAD (peripheral artery disease) (HCC)   Coronary artery disease   Stage 3a chronic kidney disease (CKD) (HCC)   Hyperlipidemia associated with type 2 diabetes mellitus (HCC)   Type 2 diabetes mellitus with diabetic neuropathy, without long-term current use of insulin (HCC)  Diabetic foot infection  S/p debridement Cellulitis R/o Osteomyelitis w/ MRI  Continue IV cefepime, Flagyl vancomycin for infectious coverage Follow-up on blood cultures Per podiatry - No plan for surgical debridement or amputation at this time. Skin necrosis seems to be superficial and likely managed with local wound care outpatient. Betadine dressing changes recommended. Follow w/ Dr Excell Seltzer in 1 week post discharge. From podiatry standpoint after angio patient stable for discharge. Recommend p.o. antibiotics for 7 days upon discharge.  Vascular surgery  angiography today went well, follow outpatient    PAD (peripheral artery disease)  Continue Lovenox, clopidogrel and rosuvastatin  Chronic combined systolic (congestive) and diastolic (congestive) heart failure (HCC) Coronary artery disease 2D echo 5/23 had shown EF of 45 to 50%  Euvolemic at present  Monitor input and output Daily weighing Continue home dose of metolazone, losartan, metoprolol, spironolactone, torsemide Patient follow-up with cardiology   Type 2 diabetes mellitus with diabetic neuropathy, without long-term current use of insulin (HCC) Monitor glucose closely Continue current insulin therapy   Hyperlipidemia associated with type 2 diabetes mellitus (HCC) Continue rosuvastatin   AKI on stage 3a chronic kidney disease (CKD) (HCC) Baseline creatinine around 1.4-2.1 Monitor renal function closely     DVT prophylaxis: *** Pertinent IV fluids/nutrition: *** Central lines / invasive devices: ***  Code Status: *** ACP documentation reviewed: ***  Current Admission Status: ***  TOC needs / Dispo plan: *** Barriers to discharge / significant pending items: ***

## 2023-03-24 NOTE — Progress Notes (Signed)
Pharmacy Antibiotic Note  Ricardo [name] is a 62 y.o. male admitted on 03/22/2023 with  diabetic foot infection . PMH significant for HTN, CAD, T2DM, and PVD. Presented with right toe wound with increased redness, swelling, and new drainage. Pharmacy has been consulted for vancomycin and cefepime dosing.  Day #3 antibiotics. Renal function improving.   Plan:  Continue vancomycin 1000 mg IV every 24 hours Goal AUC 400-550 Estimated AUC 447.9, Cmin 10.8 Used Scr 1.51, TBW 97.1 kg (IBW used), Vd 0.5 L/kg Continue cefepime 2 gm IV Q12H based on renal function Continue metronidazole 500 mg IV Q12H Monitor renal function, clinical status, culture data, and LOT  Height: 5\' 8"  (172.7 cm) Weight: 97.8 kg (215 lb 9.8 oz) IBW/kg (Calculated) : 68.4  Temp (24hrs), Avg:98.3 F (36.8 C), Min:97.9 F (36.6 C), Max:98.7 F (37.1 C)  Recent Labs  Lab 03/22/23 1042 03/22/23 1323 03/22/23 1512 03/22/23 1726 03/23/23 0517 03/24/23 0455  WBC 10.4  --   --   --  7.7 6.7  CREATININE 1.98*  --   --   --  1.58* 1.51*  LATICACIDVEN 1.2 2.0* 1.7 1.8  --   --     Estimated Creatinine Clearance: 58.3 mL/min (A) (by C-G formula based on SCr of 1.51 mg/dL (H)).    Allergies  Allergen Reactions   Lipitor [Atorvastatin] Palpitations    SVT   Sacubitril-Valsartan Other (See Comments)    SVT   Pregabalin Palpitations    SVT   Antimicrobials this admission: Ceftriaxone 8/19 >> 8/19 Vancomycin 8/19 >>  Metronidazole 8/19 >> Cefepime 8/20 >>  Dose adjustments this admission: N/A  Microbiology results: 8/19 BCx: NG x 2d 8/19 Wound cx: too young to read  Thank you for involving pharmacy in this patient's care.   Littie Deeds, PharmD PGY1 Pharmacy Resident 03/24/2023 11:13 AM

## 2023-03-25 ENCOUNTER — Telehealth: Payer: Self-pay

## 2023-03-25 ENCOUNTER — Encounter: Payer: Self-pay | Admitting: Vascular Surgery

## 2023-03-25 MED ORDER — HEPARIN (PORCINE) IN NACL 2000-0.9 UNIT/L-% IV SOLN
INTRAVENOUS | Status: DC | PRN
  Administered 2023-03-24: 1000 mL

## 2023-03-25 MED ORDER — LIDOCAINE HCL (PF) 1 % IJ SOLN
INTRAMUSCULAR | Status: DC | PRN
  Administered 2023-03-24: 10 mL

## 2023-03-25 MED ORDER — IODIXANOL 320 MG/ML IV SOLN
INTRAVENOUS | Status: DC | PRN
  Administered 2023-03-24: 50 mL

## 2023-03-25 NOTE — Transitions of Care (Post Inpatient/ED Visit) (Signed)
03/25/2023  Name: Ricardo Brown MRN: [id number] DOB: 03/11/1961  Today's TOC FU Call Status: TOC FU Call Complete Date: 03/25/23  Transition Care Management Follow-up Telephone Call Date of Discharge: 03/24/23 Discharge Facility: [facility]) Type of Discharge: Inpatient Admission Primary Inpatient Discharge Diagnosis:: DM foot wound How have you been since you were released from the hospital?: Better Any questions or concerns?: No  Items Reviewed: Did you receive and understand the discharge instructions provided?: Yes Medications obtained,verified, and reconciled?: Yes (Medications Reviewed) Any new allergies since your discharge?: No Dietary orders reviewed?: Yes Do you have support at home?: Yes People in Home: spouse  Medications Reviewed Today: Medications Reviewed Today     Reviewed by Karena Addison, LPN (Licensed Practical Nurse) on 03/25/23 at [phone]  Med List Status: <None>   Medication Order Taking? Sig Documenting Provider Last Dose Status Informant  amiodarone (PACERONE) 200 MG tablet [id number] No Take 1 tablet (200 mg total) by mouth daily. Debbe Odea, MD 03/22/2023 Active   amoxicillin-clavulanate (AUGMENTIN) 500-125 MG tablet [id number]  Take 1 tablet by mouth 2 (two) times daily. Sunnie Nielsen, DO  Active   ascorbic acid (VITAMIN C) 500 MG tablet [id number] No Take 1 tablet (500 mg total) by mouth 2 (two) times daily. Cathren Harsh, MD 03/22/2023 Active Self  aspirin 81 MG EC tablet [id number] No Take 81 mg by mouth daily.  [provider] Taking Active Self  b complex vitamins tablet [id number] No Take 1 tablet by mouth daily.  [provider] 03/22/2023 Active Self  clopidogrel (PLAVIX) 75 MG tablet [id number] No Take 1 tablet (75 mg total) by mouth daily. Georgiana Spinner, NP 03/22/2023 0600 Active   dapagliflozin propanediol (FARXIGA) 10 MG TABS tablet [id number] No Take 1 tablet (10 mg total) by  mouth daily before breakfast. Laurey Morale, MD 03/22/2023 Active   doxycycline (VIBRA-TABS) 100 MG tablet [id number]  Take 1 tablet (100 mg total) by mouth 2 (two) times daily. Sunnie Nielsen, DO  Active   Dulaglutide (TRULICITY) 1.5 MG/0.5ML SOPN [id number] No Inject 1.5 mg into the skin once a week. [provider] Past Week Active   gabapentin (NEURONTIN) 100 MG capsule [id number] No Take 1 capsule (100 mg total) by mouth 2 (two) times daily. Merita Norton T, FNP 03/22/2023 Active   insulin degludec (TRESIBA) 200 UNIT/ML FlexTouch Pen [id number] No 32 Units at bedtime. [provider] 03/21/2023 Active Self  losartan (COZAAR) 25 MG tablet [id number] No Take 1 tablet (25 mg total) by mouth daily. Debbe Odea, MD 03/22/2023 Active   metFORMIN (GLUCOPHAGE) 500 MG tablet [id number] No Take 2 tablets (1,000 mg total) by mouth 2 (two) times daily. Kara Dies, NP 03/22/2023 Active Self  metolazone (ZAROXOLYN) 5 MG tablet [id number] No Take 1 tablet (5 mg total) by mouth once a week. On Monday, hold if wt 215 lbs or less Bensimhon, Bevelyn Buckles, MD 03/22/2023 Active   metoprolol succinate (TOPROL-XL) 50 MG 24 hr tablet [id number] No TAKE 1 TABLET BY MOUTH DAILY. TAKE WITH OR IMMEDIATELY FOLLOWING A MEAL. Corky Downs, MD 03/22/2023 Active Self  Omega-3 Fatty Acids (FISH OIL) 875 MG CAPS [id number] No Take 875 mg by mouth 2 (two) times daily. [provider] 03/22/2023 Active Self  oxyCODONE-acetaminophen (PERCOCET/ROXICET) 5-325 MG tablet [id number]  Take 1 tablet by mouth every 8 (eight) hours as needed for up to 5 days for severe pain. Sunnie Nielsen, DO  Active   pramipexole (MIRAPEX) 1.5 MG tablet [id number]  Take 1 tablet (1.5 mg total) by mouth daily. Sunnie Nielsen, DO  Active   rosuvastatin (CRESTOR) 20 MG tablet [id number] No Take 1 tablet (20 mg total) by mouth daily. Debbe Odea, MD 03/22/2023 Active   spironolactone (ALDACTONE) 25 MG tablet [id number] No Take  0.5 tablets (12.5 mg total) by mouth daily. Laurey Morale, MD 03/22/2023 Active   torsemide (DEMADEX) 20 MG tablet [id number] No Take 60mg  in the morning and 40mg  in the evening Laurey Morale, MD 03/22/2023 Active             Home Care and Equipment/Supplies: Were Home Health Services Ordered?: NA Any new equipment or medical supplies ordered?: NA  Functional Questionnaire: Do you need assistance with bathing/showering or dressing?: No Do you need assistance with meal preparation?: No Do you need assistance with eating?: No Do you have difficulty maintaining continence: No Do you need assistance with getting out of bed/getting out of a chair/moving?: No Do you have difficulty managing or taking your medications?: No  Follow up appointments reviewed: PCP Follow-up appointment confirmed?: Yes Date of PCP follow-up appointment?: 03/30/23 Follow-up Provider: [facility] Follow-up appointment confirmed?: Yes Date of Specialist follow-up appointment?: 04/01/23 Follow-Up Specialty Provider:: podiatry Do you need transportation to your follow-up appointment?: No Do you understand care options if your condition(s) worsen?: Yes-patient verbalized understanding    SIGNATURE Karena Addison, LPN [facility] Nurse Health Advisor Direct Dial [phone]

## 2023-03-27 LAB — CULTURE, BLOOD (ROUTINE X 2)
Culture: NO GROWTH
Culture: NO GROWTH
Special Requests: ADEQUATE
Special Requests: ADEQUATE

## 2023-03-27 LAB — AEROBIC/ANAEROBIC CULTURE W GRAM STAIN (SURGICAL/DEEP WOUND): Gram Stain: NONE SEEN

## 2023-03-30 ENCOUNTER — Ambulatory Visit (INDEPENDENT_AMBULATORY_CARE_PROVIDER_SITE_OTHER): Payer: Medicare Other | Admitting: Family Medicine

## 2023-03-30 ENCOUNTER — Encounter: Payer: Self-pay | Admitting: Family Medicine

## 2023-03-30 VITALS — BP 113/49 | HR 67 | Ht 68.0 in | Wt 211.7 lb

## 2023-03-30 DIAGNOSIS — E11621 Type 2 diabetes mellitus with foot ulcer: Secondary | ICD-10-CM | POA: Diagnosis not present

## 2023-03-30 DIAGNOSIS — L97511 Non-pressure chronic ulcer of other part of right foot limited to breakdown of skin: Secondary | ICD-10-CM | POA: Diagnosis not present

## 2023-03-30 DIAGNOSIS — Z794 Long term (current) use of insulin: Secondary | ICD-10-CM | POA: Diagnosis not present

## 2023-03-30 DIAGNOSIS — N1832 Chronic kidney disease, stage 3b: Secondary | ICD-10-CM | POA: Diagnosis not present

## 2023-03-30 NOTE — Progress Notes (Signed)
Established patient visit   Patient: Ricardo Brown   DOB: 1960-09-17   62 y.o. Male  MRN: [id number] Visit Date: 03/30/2023  Today's healthcare provider: Jacky Kindle, FNP  Introduced to nurse practitioner role and practice setting.  All questions answered.  Discussed provider/patient relationship and expectations.  Subjective    HPI HPI     Follow-up    Additional comments: Follow up on infection in right foot. Just got out hospital thursday      Last edited by Clois Comber on 03/30/2023  2:53 PM.      Follow up Hospitalization  Patient was admitted to [facility] on 8/21 and discharged on 8/23. He was treated for DM foot infection. Treatment for this included IV Abx. Telephone follow up was done on 8/22 He reports excellent compliance with treatment. He reports this condition is improved.  ----------------------------------------------------------------------------------------- -  Medications: Outpatient Medications Prior to Visit  Medication Sig   amiodarone (PACERONE) 200 MG tablet Take 1 tablet (200 mg total) by mouth daily.   amoxicillin-clavulanate (AUGMENTIN) 500-125 MG tablet Take 1 tablet by mouth 2 (two) times daily.   ascorbic acid (VITAMIN C) 500 MG tablet Take 1 tablet (500 mg total) by mouth 2 (two) times daily.   aspirin 81 MG EC tablet Take 81 mg by mouth daily.    b complex vitamins tablet Take 1 tablet by mouth daily.    clopidogrel (PLAVIX) 75 MG tablet Take 1 tablet (75 mg total) by mouth daily.   dapagliflozin propanediol (FARXIGA) 10 MG TABS tablet Take 1 tablet (10 mg total) by mouth daily before breakfast.   doxycycline (VIBRA-TABS) 100 MG tablet Take 1 tablet (100 mg total) by mouth 2 (two) times daily.   Dulaglutide (TRULICITY) 1.5 MG/0.5ML SOPN Inject 1.5 mg into the skin once a week.   gabapentin (NEURONTIN) 100 MG capsule Take 1 capsule (100 mg total) by mouth 2 (two) times daily.   insulin degludec (TRESIBA) 200 UNIT/ML  FlexTouch Pen 32 Units at bedtime.   losartan (COZAAR) 25 MG tablet Take 1 tablet (25 mg total) by mouth daily.   metFORMIN (GLUCOPHAGE) 500 MG tablet Take 2 tablets (1,000 mg total) by mouth 2 (two) times daily.   metolazone (ZAROXOLYN) 5 MG tablet Take 1 tablet (5 mg total) by mouth once a week. On Monday, hold if wt 215 lbs or less   metoprolol succinate (TOPROL-XL) 50 MG 24 hr tablet TAKE 1 TABLET BY MOUTH DAILY. TAKE WITH OR IMMEDIATELY FOLLOWING A MEAL.   Omega-3 Fatty Acids (FISH OIL) 875 MG CAPS Take 875 mg by mouth 2 (two) times daily.   pramipexole (MIRAPEX) 1.5 MG tablet Take 1 tablet (1.5 mg total) by mouth daily.   rosuvastatin (CRESTOR) 20 MG tablet Take 1 tablet (20 mg total) by mouth daily.   spironolactone (ALDACTONE) 25 MG tablet Take 0.5 tablets (12.5 mg total) by mouth daily.   torsemide (DEMADEX) 20 MG tablet Take 60mg  in the morning and 40mg  in the evening   Facility-Administered Medications Prior to Visit  Medication Dose Route Frequency Provider   Heparin (Porcine) in NaCl 2000-0.9 UNIT/L-% SOLN    PRN Wyn Quaker, Marlow Baars, MD   iodixanol (VISIPAQUE) 320 MG/ML injection    PRN Annice Needy, MD   lidocaine (PF) (XYLOCAINE) 1 % injection    PRN Annice Needy, MD     Objective    BP (!) 113/49 (BP Location: Left Arm, Patient Position: Sitting, Cuff Size: Normal)  Pulse 67   Ht 5\' 8"  (1.727 m)   Wt 211 lb 11.2 oz (96 kg)   SpO2 98%   BMI 32.19 kg/m   Physical Exam   No results found for any visits on 03/30/23.  Assessment & Plan     Problem List Items Addressed This Visit       Endocrine   Diabetes mellitus due to underlying condition with stage 3b chronic kidney disease, with long-term current use of insulin (HCC)    Chronic, with recent eGFR improving Continues to see both nephrology and endocrinology to assist Remains on Metformin; pt reports GI distress following start of medication following hospitalization where he only received insulin Continue to recommend  dietary assistance to assist multiple chronic disease factors       Diabetic ulcer of toe of right foot associated with type 2 diabetes mellitus, limited to breakdown of skin (HCC) - Primary    Angio completed without Osteo; pt reports site is improved following IV vanc and remains on Augmentin x14 doses at this time Confirmed appt with podiatry upcoming as well as with heart failure BP and A1c remain at goal Continue to recommend balanced, lower carb meals. Smaller meal size, adding snacks. Choosing water as drink of choice and increasing purposeful exercise. Pt without concerns since hospitalization; use of off loading shoe present at time of OV      Return in about 3 months (around 06/30/2023) for chonic disease management.     Leilani Merl, FNP, have reviewed all documentation for this visit. The documentation on 03/30/23 for the exam, diagnosis, procedures, and orders are all accurate and complete.  Jacky Kindle, FNP  [facility] [phone] (phone) [phone] (fax)  [facility]

## 2023-03-30 NOTE — Assessment & Plan Note (Signed)
Angio completed without Osteo; pt reports site is improved following IV vanc and remains on Augmentin x14 doses at this time Confirmed appt with podiatry upcoming as well as with heart failure BP and A1c remain at goal Continue to recommend balanced, lower carb meals. Smaller meal size, adding snacks. Choosing water as drink of choice and increasing purposeful exercise. Pt without concerns since hospitalization; use of off loading shoe present at time of OV

## 2023-03-30 NOTE — Assessment & Plan Note (Signed)
Chronic, with recent eGFR improving Continues to see both nephrology and endocrinology to assist Remains on Metformin; pt reports GI distress following start of medication following hospitalization where he only received insulin Continue to recommend dietary assistance to assist multiple chronic disease factors

## 2023-03-31 ENCOUNTER — Ambulatory Visit: Payer: 59 | Attending: Family | Admitting: Family

## 2023-03-31 VITALS — BP 113/51 | HR 83 | Ht 68.0 in | Wt 213.0 lb

## 2023-03-31 DIAGNOSIS — I10 Essential (primary) hypertension: Secondary | ICD-10-CM | POA: Diagnosis not present

## 2023-03-31 DIAGNOSIS — E1140 Type 2 diabetes mellitus with diabetic neuropathy, unspecified: Secondary | ICD-10-CM | POA: Insufficient documentation

## 2023-03-31 DIAGNOSIS — I471 Supraventricular tachycardia, unspecified: Secondary | ICD-10-CM

## 2023-03-31 DIAGNOSIS — G4733 Obstructive sleep apnea (adult) (pediatric): Secondary | ICD-10-CM | POA: Diagnosis not present

## 2023-03-31 DIAGNOSIS — I2510 Atherosclerotic heart disease of native coronary artery without angina pectoris: Secondary | ICD-10-CM | POA: Insufficient documentation

## 2023-03-31 DIAGNOSIS — I25810 Atherosclerosis of coronary artery bypass graft(s) without angina pectoris: Secondary | ICD-10-CM | POA: Diagnosis not present

## 2023-03-31 DIAGNOSIS — Z7984 Long term (current) use of oral hypoglycemic drugs: Secondary | ICD-10-CM | POA: Diagnosis not present

## 2023-03-31 DIAGNOSIS — E1122 Type 2 diabetes mellitus with diabetic chronic kidney disease: Secondary | ICD-10-CM | POA: Insufficient documentation

## 2023-03-31 DIAGNOSIS — Z951 Presence of aortocoronary bypass graft: Secondary | ICD-10-CM | POA: Insufficient documentation

## 2023-03-31 DIAGNOSIS — I4719 Other supraventricular tachycardia: Secondary | ICD-10-CM | POA: Diagnosis not present

## 2023-03-31 DIAGNOSIS — I130 Hypertensive heart and chronic kidney disease with heart failure and stage 1 through stage 4 chronic kidney disease, or unspecified chronic kidney disease: Secondary | ICD-10-CM | POA: Insufficient documentation

## 2023-03-31 DIAGNOSIS — E785 Hyperlipidemia, unspecified: Secondary | ICD-10-CM | POA: Insufficient documentation

## 2023-03-31 DIAGNOSIS — Z952 Presence of prosthetic heart valve: Secondary | ICD-10-CM | POA: Insufficient documentation

## 2023-03-31 DIAGNOSIS — I2119 ST elevation (STEMI) myocardial infarction involving other coronary artery of inferior wall: Secondary | ICD-10-CM | POA: Insufficient documentation

## 2023-03-31 DIAGNOSIS — Z7902 Long term (current) use of antithrombotics/antiplatelets: Secondary | ICD-10-CM | POA: Insufficient documentation

## 2023-03-31 DIAGNOSIS — I9789 Other postprocedural complications and disorders of the circulatory system, not elsewhere classified: Secondary | ICD-10-CM

## 2023-03-31 DIAGNOSIS — Z79899 Other long term (current) drug therapy: Secondary | ICD-10-CM | POA: Insufficient documentation

## 2023-03-31 DIAGNOSIS — I340 Nonrheumatic mitral (valve) insufficiency: Secondary | ICD-10-CM | POA: Insufficient documentation

## 2023-03-31 DIAGNOSIS — Z794 Long term (current) use of insulin: Secondary | ICD-10-CM | POA: Insufficient documentation

## 2023-03-31 DIAGNOSIS — N183 Chronic kidney disease, stage 3 unspecified: Secondary | ICD-10-CM | POA: Diagnosis not present

## 2023-03-31 DIAGNOSIS — I5042 Chronic combined systolic (congestive) and diastolic (congestive) heart failure: Secondary | ICD-10-CM | POA: Insufficient documentation

## 2023-03-31 DIAGNOSIS — I4891 Unspecified atrial fibrillation: Secondary | ICD-10-CM | POA: Diagnosis not present

## 2023-03-31 DIAGNOSIS — I739 Peripheral vascular disease, unspecified: Secondary | ICD-10-CM

## 2023-03-31 DIAGNOSIS — Z7985 Long-term (current) use of injectable non-insulin antidiabetic drugs: Secondary | ICD-10-CM | POA: Insufficient documentation

## 2023-03-31 DIAGNOSIS — I255 Ischemic cardiomyopathy: Secondary | ICD-10-CM | POA: Diagnosis not present

## 2023-03-31 DIAGNOSIS — Z7982 Long term (current) use of aspirin: Secondary | ICD-10-CM | POA: Diagnosis not present

## 2023-03-31 NOTE — Progress Notes (Unsigned)
PCP: Merita Norton, NP (last seen Primary Cardiologist: Debbe Odea, MD (last seen   HPI:  Ricardo Brown is a 62 y/o male with a history of CAD s/p CABG, ischemic cardiomyopathy, and PAD. Patient had a delayed presentation inferior MI 06/19. Cath showed totally occluded RCA, 90% mLAD, 90% pLCx.  s/p CABG x 3 with MV repair,  LIMA-LAD, SVG-OM2, SVG-PDA at [facility]. He has an ischemic cardiomyopathy, most recent echo in 2/24 showed EF 45-50%, normal RV, s/p MV repair with no Ricardo and mean gradient 4 mmHg, IVC normal. He has PAD with below-the-knee PTCAs on left 11/23, then left TMA 01/24.  He had atrial fibrillation only noted at the time of CABG (post-op) and is not anticoagulated.    Admitted 03/22/23    He presents today for a HF f/u visit with a chief complaint of  Was unable to tolerate entresto in the past as he developed SVT with it.      ROS: All systems negative except as listed in HPI, PMH and Problem List.  SH:  Social History   Socioeconomic History   Marital status: Married    Spouse name: Not on file   Number of children: Not on file   Years of education: Not on file   Highest education level: Not on file  Occupational History   Not on file  Tobacco Use   Smoking status: Never   Smokeless tobacco: Never  Vaping Use   Vaping status: Never Used  Substance and Sexual Activity   Alcohol use: Yes    Comment: rare beer   Drug use: No   Sexual activity: Not on file  Other Topics Concern   Not on file  Social History Narrative   Lives locally with wife.  Works in [employer].  Does not routinely exercise.   Social Determinants of Health   Financial Resource Strain: Not on file  Food Insecurity: No Food Insecurity (03/22/2023)   Hunger Vital Sign    Worried About Running Out of Food in the Last Year: Never true    Ran Out of Food in the Last Year: Never true  Transportation Needs: No Transportation Needs (03/22/2023)   PRAPARE - Scientist, research (physical sciences) (Medical): No    Lack of Transportation (Non-Medical): No  Physical Activity: Inactive (01/27/2018)   Received from [facility], [facility]   Exercise Vital Sign    Days of Exercise per Week: 0 days    Minutes of Exercise per Session: 0 min  Stress: Not on file  Social Connections: Not on file  Intimate Partner Violence: Not At Risk (03/22/2023)   Humiliation, Afraid, Rape, and Kick questionnaire    Fear of Current or Ex-Partner: No    Emotionally Abused: No    Physically Abused: No    Sexually Abused: No    FH:  Family History  Problem Relation Age of Onset   Cancer Mother        died @ 36   CAD Father        First MI @ 51. S/p heart transplant. Died in mid-50's of cancer.   Cancer Father    Other Brother        alive and well    Past Medical History:  Diagnosis Date   Chronic combined systolic (congestive) and diastolic (congestive) heart failure (HCC)    a. 01/2018 EF 35-40%; b. 10/2020 Echo: EF 40-45%, glob HK. RVSP 42.95mmHg. Mildly dil LA. Mild  MS/AoV sclerosis.   CKD (chronic kidney disease), stage III (HCC)    Coronary artery disease    a. 01/2018 late presenting inferior MI; b. 01/2018 Cath: Severe multivessel dzs-->CABG x 3 (LIMA->LAD, VG->OM2, VG->RPDA @ [facility]).   Diabetes mellitus without complication (HCC)    a. Dx ~ 2000.   Heart palpitations    a. Pt reports prior nl echo's and stress tests. Last stress test w/in past 2 yrs - PCP.   High cholesterol    Hypertension    Mitral regurgitation    a. 01/2018 s/p MV repair @ time of CABG.   Myocardial infarction [facility])    Neuropathy    legs and hands   Postoperative atrial fibrillation    a. 01/2018 @ time of CABG.   PSVT (paroxysmal supraventricular tachycardia)    a. Very symptomatic with multiple ED evaluations.  Has been on amio.   Severe sepsis with acute organ dysfunction (HCC) 12/19/2021   Sleep apnea    "mild - has CPAP, doesn't use   Sleep difficulties  09/07/2019   STEMI (ST elevation myocardial infarction) (HCC) 01/24/2018   Wears dentures    full upper and lower    Current Outpatient Medications  Medication Sig Dispense Refill   amiodarone (PACERONE) 200 MG tablet Take 1 tablet (200 mg total) by mouth daily. 90 tablet 0   amoxicillin-clavulanate (AUGMENTIN) 500-125 MG tablet Take 1 tablet by mouth 2 (two) times daily. 14 tablet 0   ascorbic acid (VITAMIN C) 500 MG tablet Take 1 tablet (500 mg total) by mouth 2 (two) times daily. 60 tablet 3   aspirin 81 MG EC tablet Take 81 mg by mouth daily.      b complex vitamins tablet Take 1 tablet by mouth daily.      clopidogrel (PLAVIX) 75 MG tablet Take 1 tablet (75 mg total) by mouth daily. 30 tablet 6   dapagliflozin propanediol (FARXIGA) 10 MG TABS tablet Take 1 tablet (10 mg total) by mouth daily before breakfast. 30 tablet 6   doxycycline (VIBRA-TABS) 100 MG tablet Take 1 tablet (100 mg total) by mouth 2 (two) times daily. 14 tablet 0   Dulaglutide (TRULICITY) 1.5 MG/0.5ML SOPN Inject 1.5 mg into the skin once a week.     gabapentin (NEURONTIN) 100 MG capsule Take 1 capsule (100 mg total) by mouth 2 (two) times daily. 200 capsule 3   insulin degludec (TRESIBA) 200 UNIT/ML FlexTouch Pen 32 Units at bedtime.     losartan (COZAAR) 25 MG tablet Take 1 tablet (25 mg total) by mouth daily. 90 tablet 0   metFORMIN (GLUCOPHAGE) 500 MG tablet Take 2 tablets (1,000 mg total) by mouth 2 (two) times daily. 360 tablet 2   metoprolol succinate (TOPROL-XL) 50 MG 24 hr tablet TAKE 1 TABLET BY MOUTH DAILY. TAKE WITH OR IMMEDIATELY FOLLOWING A MEAL. 90 tablet 0   Omega-3 Fatty Acids (FISH OIL) 875 MG CAPS Take 875 mg by mouth 2 (two) times daily.     pramipexole (MIRAPEX) 1.5 MG tablet Take 1 tablet (1.5 mg total) by mouth daily.     rosuvastatin (CRESTOR) 20 MG tablet Take 1 tablet (20 mg total) by mouth daily. 90 tablet 0   spironolactone (ALDACTONE) 25 MG tablet Take 0.5 tablets (12.5 mg total) by mouth  daily. 45 tablet 3   torsemide (DEMADEX) 20 MG tablet Take 60mg  in the morning and 40mg  in the evening     No current facility-administered medications for this visit.  Facility-Administered Medications Ordered in Other Visits  Medication Dose Route Frequency Provider Last Rate Last Admin   Heparin (Porcine) in NaCl 2000-0.9 UNIT/L-% SOLN    PRN Annice Needy, MD   1,000 mL at 03/24/23 1040   iodixanol (VISIPAQUE) 320 MG/ML injection    PRN Annice Needy, MD   50 mL at 03/24/23 1040   lidocaine (PF) (XYLOCAINE) 1 % injection    PRN Annice Needy, MD   10 mL at 03/24/23 1040      PHYSICAL EXAM:  General:  Well appearing. No resp difficulty HEENT: normal Neck: supple. JVP flat. Carotids 2+ bilaterally; no bruits. No lymphadenopathy or thryomegaly appreciated. Cor: PMI normal. Regular rate & rhythm. No rubs, gallops or murmurs. Lungs: clear Abdomen: soft, nontender, nondistended. No hepatosplenomegaly. No bruits or masses. Good bowel sounds. Extremities: no cyanosis, clubbing, rash, edema Neuro: alert & orientedx3, cranial nerves grossly intact. Moves all 4 extremities w/o difficulty. Affect pleasant.   ECG:   ASSESSMENT & PLAN:  1. Chronic HF with mid range EF: Ischemic cardiomyopathy.  Echo in 2/24 with EF 45-50%, normal RV, s/p MV repair with no Ricardo and mean gradient 4 mmHg, IVC normal.  He is still volume overloaded with NYHA class III symptoms. Challenging to balance euvolemia with renal function.  - Increase torsemide to 60 mg qam/40 mg qpm. BMET/BNP today and BMET in 1 week.  - No metolazone.  - Start on spironolactone 12.5 daily.  - Continue Farxiga 10 daily.  - Continue losartan 25 mg daily. He did not tolerate Entresto in the past per report.  - Continue Toprol XL 50 mg daily.   2: HTN-  3: CAD: Late presentation inferior MI in 6/19 with CABG x 3 at [facility].  No chest pain.  - Continue ASA 81 and Plavix 75 daily for now.  Could consider dropping ASA in the future.  -  Continue statin, good lipids in 5/24.   4: Atrial fibrillation: Only noted post-op after CABG in 2019.  No palpitations.  - Anticoagulate if atrial fibrillation is noted to recur.   5: SVT/atrial tachycardia: Patient is followed by EP for this.  He has been on amiodarone to suppress.  - Continue amiodarone. Recent LFTs and TSH were normal.  He will need regular eye exam.   6: PAD: Had PTCAs to below the knee arteries on left in 11/23.  In 1/24, he had left transmetatarsal amputation. He is a nonsmoker.  - Followed by vascular surgery.  - Continue ASA, Plavix, statin. Last LDL at goal 52 (5/24)   7: Mitral regurgitation: S/p MV repair with CABG.  Valve was stable on last echo.

## 2023-04-01 ENCOUNTER — Encounter: Payer: Self-pay | Admitting: Family

## 2023-04-06 ENCOUNTER — Telehealth: Payer: Self-pay | Admitting: Cardiology

## 2023-04-06 MED ORDER — AMIODARONE HCL 200 MG PO TABS: 200.0000 mg | ORAL_TABLET | Freq: Every day | ORAL | 0 refills | Status: DC

## 2023-04-06 NOTE — Telephone Encounter (Signed)
*  STAT* If patient is at the pharmacy, call can be transferred to refill team.   1. Which medications need to be refilled? (please list name of each medication and dose if known)   amiodarone (PACERONE) 200 MG tablet   2. Would you like to learn more about the convenience, safety, & potential cost savings by using the [facility]?   3. Are you open to using the [facility] (Type [facility]. ).  4. Which pharmacy/location (including street and city if local pharmacy) is medication to be sent to?  [facility] [address], [place] - [address]   5. Do they need a 30 day or 90 day supply?   90 day  Patient stated he is completely out of this medication.

## 2023-04-15 ENCOUNTER — Other Ambulatory Visit (INDEPENDENT_AMBULATORY_CARE_PROVIDER_SITE_OTHER): Payer: Self-pay | Admitting: Vascular Surgery

## 2023-04-15 DIAGNOSIS — Z9889 Other specified postprocedural states: Secondary | ICD-10-CM

## 2023-04-20 ENCOUNTER — Telehealth: Payer: Self-pay

## 2023-04-20 DIAGNOSIS — D175 Benign lipomatous neoplasm of intra-abdominal organs: Secondary | ICD-10-CM

## 2023-04-20 DIAGNOSIS — K635 Polyp of colon: Secondary | ICD-10-CM

## 2023-04-20 DIAGNOSIS — Z86010 Personal history of colonic polyps: Secondary | ICD-10-CM

## 2023-04-20 NOTE — Telephone Encounter (Signed)
Referral placed.

## 2023-04-20 NOTE — Telephone Encounter (Signed)
Copied from CRM [id number]. Topic: Referral - Request for Referral >> Apr 20, 2023  9:16 AM Franchot Heidelberg wrote: Has patient seen PCP for this complaint? Yes.   *If NO, is insurance requiring patient see PCP for this issue before PCP can refer them? Referral for which specialty: GI Preferred provider/office: [facility]  Reason for referral: Overdue for a colonoscopy

## 2023-04-21 ENCOUNTER — Ambulatory Visit: Payer: 59 | Admitting: Family Medicine

## 2023-04-23 ENCOUNTER — Ambulatory Visit (INDEPENDENT_AMBULATORY_CARE_PROVIDER_SITE_OTHER): Payer: Medicare Other | Admitting: Nurse Practitioner

## 2023-04-23 ENCOUNTER — Ambulatory Visit (INDEPENDENT_AMBULATORY_CARE_PROVIDER_SITE_OTHER): Payer: Medicare Other

## 2023-04-23 VITALS — BP 124/70 | HR 77 | Resp 18 | Ht 68.0 in | Wt 216.0 lb

## 2023-04-23 DIAGNOSIS — I739 Peripheral vascular disease, unspecified: Secondary | ICD-10-CM

## 2023-04-23 DIAGNOSIS — E1122 Type 2 diabetes mellitus with diabetic chronic kidney disease: Secondary | ICD-10-CM | POA: Diagnosis not present

## 2023-04-23 DIAGNOSIS — Z9889 Other specified postprocedural states: Secondary | ICD-10-CM | POA: Diagnosis not present

## 2023-04-23 DIAGNOSIS — N1832 Chronic kidney disease, stage 3b: Secondary | ICD-10-CM

## 2023-04-23 DIAGNOSIS — Z794 Long term (current) use of insulin: Secondary | ICD-10-CM

## 2023-04-23 DIAGNOSIS — I10 Essential (primary) hypertension: Secondary | ICD-10-CM

## 2023-04-26 ENCOUNTER — Telehealth: Payer: Self-pay | Admitting: Gastroenterology

## 2023-04-26 ENCOUNTER — Encounter (INDEPENDENT_AMBULATORY_CARE_PROVIDER_SITE_OTHER): Payer: Self-pay | Admitting: Nurse Practitioner

## 2023-04-26 LAB — VAS US ABI WITH/WO TBI
Left ABI: 1.12
Right ABI: 1.23

## 2023-04-26 NOTE — Progress Notes (Signed)
Subjective:    Patient ID: Ricardo Brown, male    DOB: 12/02/1960, 62 y.o.   MRN: [id number] Chief Complaint  Patient presents with   Venous Insufficiency    The patient returns to the office for followup and review status post angiogram with intervention on 03/24/2023.   Procedure: Procedure(s) Performed:             1.  Ultrasound guidance for vascular access left femoral artery             2.  Catheter placement into right SFA from left femoral approach             3.  Aortogram and selective right lower extremity angiogram             4.  Percutaneous transluminal angioplasty of right anterior tibial artery with 3 and 3.5 mm balloon             5.  Percutaneous transluminal angioplasty of right tibioperoneal trunk and proximal posterior tibial artery with 4 mm diameter Lutonix drug-coated angioplasty balloon             6.  StarClose closure device left femoral artery   The patient notes improvement in the lower extremity symptoms. No interval shortening of the patient's claudication distance or rest pain symptoms. No new ulcers or wounds have occurred since the last visit, however the wound he presented to [facility] with is progressing.  The patient underwent angiogram due to slow healing wound after presentation due to diabetic foot infection  There have been no significant changes to the patient's overall health care.  No documented history of amaurosis fugax or recent TIA symptoms. There are no recent neurological changes noted. No documented history of DVT, PE or superficial thrombophlebitis. The patient denies recent episodes of angina or shortness of breath.   ABI's Rt=1.23 and Lt=1.12  (previous ABI's Rt=1.28 and Lt=1.25) Duplex US of the bilateral tibial vessels reveals strong triphasic waveforms with good toe waveforms on the right and previous amputation on the left.    Review of Systems  Musculoskeletal:  Positive for gait problem.   Skin:  Positive for wound.  All other systems reviewed and are negative.      Objective:   Physical Exam Vitals reviewed.  HENT:     Head: Normocephalic.  Cardiovascular:     Rate and Rhythm: Normal rate.     Pulses:          Dorsalis pedis pulses are detected w/ Doppler on the right side and detected w/ Doppler on the left side.       Posterior tibial pulses are detected w/ Doppler on the right side and detected w/ Doppler on the left side.  Pulmonary:     Effort: Pulmonary effort is normal.  Skin:    General: Skin is warm and dry.  Neurological:     Mental Status: He is alert and oriented to person, place, and time.  Psychiatric:        Mood and Affect: Mood normal.        Behavior: Behavior normal.        Thought Content: Thought content normal.        Judgment: Judgment normal.     BP 124/70 (BP Location: Left Arm)   Pulse 77   Resp 18   Ht 5\' 8"  (1.727 m)   Wt 216 lb (98 kg)   BMI 32.84 kg/m   Past  Medical History:  Diagnosis Date   Chronic combined systolic (congestive) and diastolic (congestive) heart failure (HCC)    a. 01/2018 EF 35-40%; b. 10/2020 Echo: EF 40-45%, glob HK. RVSP 42.32mmHg. Mildly dil LA. Mild MS/AoV sclerosis.   CKD (chronic kidney disease), stage Brown (HCC)    Coronary artery disease    a. 01/2018 late presenting inferior MI; b. 01/2018 Cath: Severe multivessel dzs-->CABG x 3 (LIMA->LAD, VG->OM2, VG->RPDA @ [facility]).   Diabetes mellitus without complication (HCC)    a. Dx ~ 2000.   Heart palpitations    a. Pt reports prior nl echo's and stress tests. Last stress test w/in past 2 yrs - PCP.   High cholesterol    Hypertension    Mitral regurgitation    a. 01/2018 s/p MV repair @ time of CABG.   Myocardial infarction [facility])    Neuropathy    legs and hands   Postoperative atrial fibrillation    a. 01/2018 @ time of CABG.   PSVT (paroxysmal supraventricular tachycardia)    a. Very symptomatic with multiple ED evaluations.  Has been on amio.    Severe sepsis with acute organ dysfunction (HCC) 12/19/2021   Sleep apnea    "mild - has CPAP, doesn't use   Sleep difficulties 09/07/2019   STEMI (ST elevation myocardial infarction) (HCC) 01/24/2018   Wears dentures    full upper and lower    Social History   Socioeconomic History   Marital status: Married    Spouse name: Not on file   Number of children: Not on file   Years of education: Not on file   Highest education level: Not on file  Occupational History   Not on file  Tobacco Use   Smoking status: Never   Smokeless tobacco: Never  Vaping Use   Vaping status: Never Used  Substance and Sexual Activity   Alcohol use: Yes    Comment: rare beer   Drug use: No   Sexual activity: Not on file  Other Topics Concern   Not on file  Social History Narrative   Lives locally with wife.  Works in [employer].  Does not routinely exercise.   Social Determinants of Health   Financial Resource Strain: Not on file  Food Insecurity: No Food Insecurity (03/22/2023)   Hunger Vital Sign    Worried About Running Out of Food in the Last Year: Never true    Ran Out of Food in the Last Year: Never true  Transportation Needs: No Transportation Needs (03/22/2023)   PRAPARE - Administrator, Civil Service (Medical): No    Lack of Transportation (Non-Medical): No  Physical Activity: Inactive (01/27/2018)   [facility]   Exercise Vital Sign    Days of Exercise per Week: 0 days    Minutes of Exercise per Session: 0 min  Stress: Not on file  Social Connections: Not on file  Intimate Partner Violence: Not At Risk (03/22/2023)   Humiliation, Afraid, Rape, and Kick questionnaire    Fear of Current or Ex-Partner: No    Emotionally Abused: No    Physically Abused: No    Sexually Abused: No    Past Surgical History:  Procedure Laterality Date   AMPUTATION TOE Right 08/29/2021   Procedure: AMPUTATION TOE-2nd toe;   Surgeon: Linus Galas, DPM;  Location: [facility];  Service: Podiatry;  Laterality: Right;   AMPUTATION TOE Left 12/22/2021   Procedure: AMPUTATION TOE-2nd Toe  Partial;  Surgeon: Rosetta Posner, DPM;  Location: [facility];  Service: Podiatry;  Laterality: Left;   CARDIAC CATHETERIZATION     CARDIAC VALVE REPLACEMENT     Mitral Valve Repair   CATARACT EXTRACTION W/PHACO Left 10/08/2022   Procedure: CATARACT EXTRACTION PHACO AND INTRAOCULAR LENS PLACEMENT (IOC) LEFT DIABETIC OMIDRIA 20.94 01:35.9;  Surgeon: Estanislado Pandy, MD;  Location: [facility];  Service: Ophthalmology;  Laterality: Left;  Diabetic   COLONOSCOPY WITH PROPOFOL N/A 06/07/2019   Procedure: COLONOSCOPY WITH PROPOFOL;  Surgeon: Pasty Spillers, MD;  Location: [facility];  Service: Endoscopy;  Laterality: N/A;   COLONOSCOPY WITH PROPOFOL N/A 02/07/2020   Procedure: COLONOSCOPY WITH PROPOFOL;  Surgeon: Pasty Spillers, MD;  Location: [facility];  Service: Endoscopy;  Laterality: N/A;   COLONOSCOPY WITH PROPOFOL N/A 12/11/2020   Procedure: COLONOSCOPY WITH PROPOFOL;  Surgeon: Pasty Spillers, MD;  Location: [facility] END[facility];  Service: Endoscopy;  Laterality: N/A;   CORONARY ANGIOPLASTY     CORONARY ARTERY BYPASS GRAFT     x 3   01/2018   ESOPHAGOGASTRODUODENOSCOPY N/A 02/07/2020   Procedure: ESOPHAGOGASTRODUODENOSCOPY (EGD);  Surgeon: Pasty Spillers, MD;  Location: [facility] ENDOSCOPY;  Service: Endoscopy;  Laterality: N/A;   INCISION AND DRAINAGE Left 06/05/2022   Procedure: INCISION AND DRAINAGE;  Surgeon: Rosetta Posner, DPM;  Location: [facility];  Service: Podiatry;  Laterality: Left;   LEFT HEART CATH AND CORONARY ANGIOGRAPHY N/A 01/24/2018   Procedure: LEFT HEART CATH AND CORONARY ANGIOGRAPHY;  Surgeon: Iran Ouch, MD;  Location: [facility];  Service: Cardiovascular;  Laterality: N/A;   LOWER EXTREMITY ANGIOGRAPHY Left 06/08/2022   Procedure: Lower Extremity Angiography;  Surgeon: Annice Needy, MD;  Location: [facility];  Service: Cardiovascular;  Laterality: Left;   LOWER EXTREMITY INTERVENTION Right 03/24/2023   Procedure: LOWER EXTREMITY INTERVENTION;  Surgeon: Annice Needy, MD;  Location: [facility];  Service: Cardiovascular;  Laterality: Right;   SVT ABLATION N/A 09/12/2021   Procedure: SVT ABLATION;  Surgeon: Lanier Prude, MD;  Location: [facility] INVASIVE CV LAB;  Service: Cardiovascular;  Laterality: N/A;   TONSILLECTOMY     TRANSMETATARSAL AMPUTATION Left 08/04/2022   Procedure: TRANSMETATARSAL AMPUTATION;  Surgeon: Linus Galas, DPM;  Location: [facility];  Service: Podiatry;  Laterality: Left;    Family History  Problem Relation Age of Onset   Cancer Mother        died @ 97   CAD Father        First MI @ 23. S/p heart transplant. Died in mid-50's of cancer.   Cancer Father    Other Brother        alive and well    Allergies  Allergen Reactions   Lipitor [Atorvastatin] Palpitations    SVT   Sacubitril-Valsartan Other (See Comments)    SVT   Pregabalin Palpitations    SVT       Latest Ref Rng & Units 03/24/2023    4:55 AM 03/23/2023    5:17 AM 03/22/2023   10:42 AM  CBC  WBC 4.0 - 10.5 K/uL 6.7  7.7  10.4   Hemoglobin 13.0 - 17.0 g/dL 95.6  21.3  08.6   Hematocrit 39.0 - 52.0 % 33.7  31.9  38.0   Platelets 150 - 400 K/uL 188  175  207       CMP     Component Value Date/Time   NA 137 03/24/2023 0455   NA 136  08/26/2021 0952   K 3.8 03/24/2023 0455   CL 102 03/24/2023 0455   CO2 29 03/24/2023 0455   GLUCOSE 159 (H) 03/24/2023 0455   BUN 33 (H) 03/24/2023 0455   BUN 69 (H) 08/26/2021 0952   CREATININE 1.51 (H) 03/24/2023 0455   CREATININE 1.31 09/30/2020 1114   CALCIUM 8.3 (L) 03/24/2023 0455   PROT 6.4 (L) 03/23/2023 0517   ALBUMIN 3.3 (L) 03/23/2023 0517   AST 16 03/23/2023 0517   ALT 15 03/23/2023 0517   ALKPHOS 44 03/23/2023 0517   BILITOT 0.8 03/23/2023 0517   EGFR 29 (L) 08/26/2021 0952   GFRNONAA 52 (L) 03/24/2023  0455   GFRNONAA 59 (L) 09/30/2020 1114     No results found.     Assessment & Plan:   1. Peripheral arterial disease with history of revascularization (HCC) Recommend:  The patient is status post successful angiogram with intervention.  The patient reports that the claudication symptoms and leg pain has improved.   The patient denies lifestyle limiting changes at this point in time.  No further invasive studies, angiography or surgery at this time The patient should continue walking and begin a more formal exercise program.  The patient should continue antiplatelet therapy and aggressive treatment of the lipid abnormalities  Continued surveillance is indicated as atherosclerosis is likely to progress with time.    Patient should undergo noninvasive studies as ordered. The patient will follow up with me to review the studies.   2. Primary hypertension Continue antihypertensive medications as already ordered, these medications have been reviewed and there are no changes at this time.  3. Type 2 diabetes mellitus with stage 3b chronic kidney disease, with long-term current use of insulin (HCC) Continue hypoglycemic medications as already ordered, these medications have been reviewed and there are no changes at this time.  Hgb A1C to be monitored as already arranged by primary service   Current Outpatient Medications on File Prior to Visit  Medication Sig Dispense Refill   amiodarone (PACERONE) 200 MG tablet Take 1 tablet (200 mg total) by mouth daily. 90 tablet 0   ascorbic acid (VITAMIN C) 500 MG tablet Take 1 tablet (500 mg total) by mouth 2 (two) times daily. 60 tablet 3   aspirin 81 MG EC tablet Take 81 mg by mouth daily.      b complex vitamins tablet Take 1 tablet by mouth daily.      clopidogrel (PLAVIX) 75 MG tablet Take 1 tablet (75 mg total) by mouth daily. 30 tablet 6   dapagliflozin propanediol (FARXIGA) 10 MG TABS tablet Take 1 tablet (10 mg total) by mouth daily  before breakfast. 30 tablet 6   Dulaglutide (TRULICITY) 1.5 MG/0.5ML SOPN Inject 1.5 mg into the skin once a week.     gabapentin (NEURONTIN) 100 MG capsule Take 1 capsule (100 mg total) by mouth 2 (two) times daily. 200 capsule 3   insulin degludec (TRESIBA) 200 UNIT/ML FlexTouch Pen 32 Units at bedtime.     losartan (COZAAR) 25 MG tablet Take 1 tablet (25 mg total) by mouth daily. 90 tablet 0   metFORMIN (GLUCOPHAGE) 500 MG tablet Take 2 tablets (1,000 mg total) by mouth 2 (two) times daily. 360 tablet 2   metoprolol succinate (TOPROL-XL) 50 MG 24 hr tablet TAKE 1 TABLET BY MOUTH DAILY. TAKE WITH OR IMMEDIATELY FOLLOWING A MEAL. 90 tablet 0   Omega-3 Fatty Acids (FISH OIL) 875 MG CAPS Take 875 mg by mouth 2 (two) times  daily.     pramipexole (MIRAPEX) 1.5 MG tablet Take 1 tablet (1.5 mg total) by mouth daily.     rosuvastatin (CRESTOR) 20 MG tablet Take 1 tablet (20 mg total) by mouth daily. 90 tablet 0   spironolactone (ALDACTONE) 25 MG tablet Take 0.5 tablets (12.5 mg total) by mouth daily. 45 tablet 3   torsemide (DEMADEX) 20 MG tablet Take 60mg  in the morning and 40mg  in the evening     Current Facility-Administered Medications on File Prior to Visit  Medication Dose Route Frequency Provider Last Rate Last Admin   Heparin (Porcine) in NaCl 2000-0.9 UNIT/L-% SOLN    PRN Annice Needy, MD   1,000 mL at 03/24/23 1040   iodixanol (VISIPAQUE) 320 MG/ML injection    PRN Annice Needy, MD   50 mL at 03/24/23 1040   lidocaine (PF) (XYLOCAINE) 1 % injection    PRN Annice Needy, MD   10 mL at 03/24/23 1040    There are no Patient Instructions on file for this visit. No follow-ups on file.   Georgiana Spinner, NP

## 2023-04-26 NOTE — Telephone Encounter (Signed)
Patient called back in to schedule his colonoscopy.

## 2023-04-27 ENCOUNTER — Telehealth: Payer: Self-pay

## 2023-04-27 ENCOUNTER — Telehealth: Payer: Self-pay | Admitting: Cardiology

## 2023-04-27 ENCOUNTER — Telehealth: Payer: Self-pay | Admitting: *Deleted

## 2023-04-27 ENCOUNTER — Other Ambulatory Visit: Payer: Self-pay | Admitting: *Deleted

## 2023-04-27 DIAGNOSIS — Z86010 Personal history of colonic polyps: Secondary | ICD-10-CM

## 2023-04-27 MED ORDER — PEG 3350-KCL-NABCB-NACL-NASULF 236 G PO SOLR: 4000.0000 mL | Freq: Once | ORAL | 0 refills | Status: AC

## 2023-04-27 NOTE — Telephone Encounter (Signed)
  Patient Consent for Virtual Visit         [name] has provided verbal consent on 04/27/2023 for a virtual visit (video or telephone).   CONSENT FOR VIRTUAL VISIT FOR:  Ricardo Brown  By participating in this virtual visit I agree to the following:  I hereby voluntarily request, consent and authorize [facility] and its employed or contracted physicians, physician assistants, nurse practitioners or other licensed health care professionals (the Practitioner), to provide me with telemedicine health care services (the "Services") as deemed necessary by the treating Practitioner. I acknowledge and consent to receive the Services by the Practitioner via telemedicine. I understand that the telemedicine visit will involve communicating with the Practitioner through live audiovisual communication technology and the disclosure of certain medical information by electronic transmission. I acknowledge that I have been given the opportunity to request an in-person assessment or other available alternative prior to the telemedicine visit and am voluntarily participating in the telemedicine visit.  I understand that I have the right to withhold or withdraw my consent to the use of telemedicine in the course of my care at any time, without affecting my right to future care or treatment, and that the Practitioner or I may terminate the telemedicine visit at any time. I understand that I have the right to inspect all information obtained and/or recorded in the course of the telemedicine visit and may receive copies of available information for a reasonable fee.  I understand that some of the potential risks of receiving the Services via telemedicine include:  Delay or interruption in medical evaluation due to technological equipment failure or disruption; Information transmitted may not be sufficient (e.g. poor resolution of images) to allow for appropriate medical decision making  by the Practitioner; and/or  In rare instances, security protocols could fail, causing a breach of personal health information.  Furthermore, I acknowledge that it is my responsibility to provide information about my medical history, conditions and care that is complete and accurate to the best of my ability. I acknowledge that Practitioner's advice, recommendations, and/or decision may be based on factors not within their control, such as incomplete or inaccurate data provided by me or distortions of diagnostic images or specimens that may result from electronic transmissions. I understand that the practice of medicine is not an exact science and that Practitioner makes no warranties or guarantees regarding treatment outcomes. I acknowledge that a copy of this consent can be made available to me via my patient portal [facility] MyChart), or I can request a printed copy by calling the office of [facility].    I understand that my insurance will be billed for this visit.   I have read or had this consent read to me. I understand the contents of this consent, which adequately explains the benefits and risks of the Services being provided via telemedicine.  I have been provided ample opportunity to ask questions regarding this consent and the Services and have had my questions answered to my satisfaction. I give my informed consent for the services to be provided through the use of telemedicine in my medical care

## 2023-04-27 NOTE — Telephone Encounter (Signed)
Name: Ricardo Brown  DOB: 09-12-1960  MRN: [id number]  Primary Cardiologist: Debbe Odea, MD   Preoperative team, please contact this patient and set up a phone call appointment for further preoperative risk assessment. Please obtain consent and complete medication review. Thank you for your help.  I confirm that guidance regarding antiplatelet and oral anticoagulation therapy has been completed and, if necessary, noted below.   Marcelino Duster, PA 04/27/2023, 3:43 PM  [facility]

## 2023-04-27 NOTE — Telephone Encounter (Signed)
Patient scheduled for tele visit on 05/18/23. Med rec and consent done.

## 2023-04-27 NOTE — Telephone Encounter (Signed)
Colonoscopy schedule with Dr Tobi Bastos on 06/07/2023

## 2023-04-27 NOTE — Telephone Encounter (Signed)
Pre-operative Risk Assessment    Patient Name: Ricardo Brown  DOB: April 21, 1961 MRN: [id number]      Request for Surgical Clearance    Procedure:  colonoscopy  Date of Surgery:  Clearance 06/07/23                                 Surgeon:  not indicated Surgeon's Group or Practice Name:  [facility] Phone number:  [phone] Fax number:  [phone]   Type of Clearance Requested:   - Medical    Type of Anesthesia:  General    Additional requests/questions:    Queen Slough   04/27/2023, 3:00 PM

## 2023-04-27 NOTE — Telephone Encounter (Signed)
Gastroenterology Pre-Procedure Review  Request Date: 06/07/2023 Requesting Physician: Dr. Tobi Bastos  PATIENT REVIEW QUESTIONS: The patient responded to the following health history questions as indicated:    1. Are you having any GI issues? no 2. Do you have a personal history of Polyps? yes (12/11/2020) 3. Do you have a family history of Colon Cancer or Polyps? no 4. Diabetes Mellitus? yes (Farxiga, Trulicity, insulin, metformin) 5. Joint replacements in the past 12 months?no 6. Major health problems in the past 3 months?no 7. Any artificial heart valves, MVP, or defibrillator?  History of postoperative (CABG) atrial fibrillation    MEDICATIONS & ALLERGIES:    Patient reports the following regarding taking any anticoagulation/antiplatelet therapy:   Plavix, Coumadin, Eliquis, Xarelto, Lovenox, Pradaxa, Brilinta, or Effient? yes (Plavix) Aspirin? no  Patient confirms/reports the following medications:  Current Outpatient Medications  Medication Sig Dispense Refill   amiodarone (PACERONE) 200 MG tablet Take 1 tablet (200 mg total) by mouth daily. 90 tablet 0   ascorbic acid (VITAMIN C) 500 MG tablet Take 1 tablet (500 mg total) by mouth 2 (two) times daily. 60 tablet 3   aspirin 81 MG EC tablet Take 81 mg by mouth daily.      b complex vitamins tablet Take 1 tablet by mouth daily.      clopidogrel (PLAVIX) 75 MG tablet Take 1 tablet (75 mg total) by mouth daily. 30 tablet 6   dapagliflozin propanediol (FARXIGA) 10 MG TABS tablet Take 1 tablet (10 mg total) by mouth daily before breakfast. 30 tablet 6   Dulaglutide (TRULICITY) 1.5 MG/0.5ML SOPN Inject 1.5 mg into the skin once a week.     gabapentin (NEURONTIN) 100 MG capsule Take 1 capsule (100 mg total) by mouth 2 (two) times daily. 200 capsule 3   insulin degludec (TRESIBA) 200 UNIT/ML FlexTouch Pen 32 Units at bedtime.     losartan (COZAAR) 25 MG tablet Take 1 tablet (25 mg total) by mouth daily. 90 tablet 0   metFORMIN (GLUCOPHAGE) 500  MG tablet Take 2 tablets (1,000 mg total) by mouth 2 (two) times daily. 360 tablet 2   metoprolol succinate (TOPROL-XL) 50 MG 24 hr tablet TAKE 1 TABLET BY MOUTH DAILY. TAKE WITH OR IMMEDIATELY FOLLOWING A MEAL. 90 tablet 0   Omega-3 Fatty Acids (FISH OIL) 875 MG CAPS Take 875 mg by mouth 2 (two) times daily.     pramipexole (MIRAPEX) 1.5 MG tablet Take 1 tablet (1.5 mg total) by mouth daily.     rosuvastatin (CRESTOR) 20 MG tablet Take 1 tablet (20 mg total) by mouth daily. 90 tablet 0   spironolactone (ALDACTONE) 25 MG tablet Take 0.5 tablets (12.5 mg total) by mouth daily. 45 tablet 3   torsemide (DEMADEX) 20 MG tablet Take 60mg  in the morning and 40mg  in the evening     No current facility-administered medications for this visit.   Facility-Administered Medications Ordered in Other Visits  Medication Dose Route Frequency Provider Last Rate Last Admin   Heparin (Porcine) in NaCl 2000-0.9 UNIT/L-% SOLN    PRN Annice Needy, MD   1,000 mL at 03/24/23 1040   iodixanol (VISIPAQUE) 320 MG/ML injection    PRN Annice Needy, MD   50 mL at 03/24/23 1040   lidocaine (PF) (XYLOCAINE) 1 % injection    PRN Annice Needy, MD   10 mL at 03/24/23 1040    Patient confirms/reports the following allergies:  Allergies  Allergen Reactions   Lipitor [Atorvastatin] Palpitations  SVT   Sacubitril-Valsartan Other (See Comments)    SVT   Pregabalin Palpitations    SVT    No orders of the defined types were placed in this encounter.   AUTHORIZATION INFORMATION Primary Insurance: 1D#: Group #:  Secondary Insurance: 1D#: Group #:  SCHEDULE INFORMATION: Date: 06/07/2023 Time: Location:  [facility]

## 2023-05-05 ENCOUNTER — Encounter: Payer: Self-pay | Admitting: Cardiology

## 2023-05-05 ENCOUNTER — Ambulatory Visit: Payer: 59 | Attending: Cardiology | Admitting: Cardiology

## 2023-05-05 VITALS — BP 130/64 | HR 79 | Ht 68.0 in | Wt 218.1 lb

## 2023-05-05 DIAGNOSIS — I471 Supraventricular tachycardia, unspecified: Secondary | ICD-10-CM | POA: Diagnosis not present

## 2023-05-05 DIAGNOSIS — I5022 Chronic systolic (congestive) heart failure: Secondary | ICD-10-CM | POA: Diagnosis not present

## 2023-05-05 DIAGNOSIS — I25810 Atherosclerosis of coronary artery bypass graft(s) without angina pectoris: Secondary | ICD-10-CM | POA: Diagnosis not present

## 2023-05-05 NOTE — Progress Notes (Signed)
Cardiology Office Note Date:  05/05/2023  Patient ID:  Ricardo Brown, DOB [birth year], MRN [id number] PCP:  Jacky Kindle, FNP  Cardiologist:  Debbe Odea, MD HF Cardiologist: Marca Ancona, MD Electrophysiologist: Lanier Prude, MD     Chief Complaint: 6mon  History of Present Illness: Ricardo Brown is a 62 y.o. male with PMH notable for SVT, atrial tach, HFmrEF, CAD s/p CABG, HTN, PVD, T2DM, CKD-3; seen today for Lanier Prude, MD for routine electrophysiology followup.  S/p EP study where SVT was inducible, but unable to safely ablation d/t location. He last saw Dr. Lalla Brothers 10/2022, maintaining sinus on amiodarone. Recently had foot amputation surgery. Euvolemic.  He saw Dr. Azucena Cecil 01/2023, doing well.   On follow-up today, he has no complaints. Tolerating amiodarone well, no SVT episodes. He continues to take 60mg  torsemide in AM, 40mg  in PM right before bed. He has to get up multiple times throughout the night to urinate. He denies chest pain, chest pressure. He does get SOB with exertion, did not get SOB today with walking from waiting room to exam room today. He has good appetite, edentulous so eats mostly fruit and vegetables, soups. Always eats low-sodium options. He does have increased lower extremity edema today, states he has been busy all day running errands.   AAD History: Amiodarone   Past Medical History:  Diagnosis Date   Chronic combined systolic (congestive) and diastolic (congestive) heart failure (HCC)    a. 01/2018 EF 35-40%; b. 10/2020 Echo: EF 40-45%, glob HK. RVSP 42.67mmHg. Mildly dil LA. Mild MS/AoV sclerosis.   CKD (chronic kidney disease), stage Brown (HCC)    Coronary artery disease    a. 01/2018 late presenting inferior MI; b. 01/2018 Cath: Severe multivessel dzs-->CABG x 3 (LIMA->LAD, VG->OM2, VG->RPDA @ [facility]).   Diabetes mellitus without complication (HCC)    a. Dx ~ 2000.   Heart palpitations    a. Pt reports prior nl  echo's and stress tests. Last stress test w/in past 2 yrs - PCP.   High cholesterol    Hypertension    Mitral regurgitation    a. 01/2018 s/p MV repair @ time of CABG.   Myocardial infarction [facility])    Neuropathy    legs and hands   Postoperative atrial fibrillation    a. 01/2018 @ time of CABG.   PSVT (paroxysmal supraventricular tachycardia) (HCC)    a. Very symptomatic with multiple ED evaluations.  Has been on amio.   Severe sepsis with acute organ dysfunction (HCC) 12/19/2021   Sleep apnea    "mild - has CPAP, doesn't use   Sleep difficulties 09/07/2019   STEMI (ST elevation myocardial infarction) (HCC) 01/24/2018   Wears dentures    full upper and lower    Past Surgical History:  Procedure Laterality Date   AMPUTATION TOE Right 08/29/2021   Procedure: AMPUTATION TOE-2nd toe;  Surgeon: Linus Galas, DPM;  Location: [facility] ORS;  Service: Podiatry;  Laterality: Right;   AMPUTATION TOE Left 12/22/2021   Procedure: AMPUTATION TOE-2nd Toe Partial;  Surgeon: Rosetta Posner, DPM;  Location: [facility] ORS;  Service: Podiatry;  Laterality: Left;   CARDIAC CATHETERIZATION     CARDIAC VALVE REPLACEMENT     Mitral Valve Repair   CATARACT EXTRACTION W/PHACO Left 10/08/2022   Procedure: CATARACT EXTRACTION PHACO AND INTRAOCULAR LENS PLACEMENT (IOC) LEFT DIABETIC OMIDRIA 20.94 01:35.9;  Surgeon: Estanislado Pandy, MD;  Location: [facility];  Service: Ophthalmology;  Laterality: Left;  Diabetic  COLONOSCOPY WITH PROPOFOL N/A 06/07/2019   Procedure: COLONOSCOPY WITH PROPOFOL;  Surgeon: Pasty Spillers, MD;  Location: [facility] ENDOSCOPY;  Service: Endoscopy;  Laterality: N/A;   COLONOSCOPY WITH PROPOFOL N/A 02/07/2020   Procedure: COLONOSCOPY WITH PROPOFOL;  Surgeon: Pasty Spillers, MD;  Location: [facility] ENDOSCOPY;  Service: Endoscopy;  Laterality: N/A;   COLONOSCOPY WITH PROPOFOL N/A 12/11/2020   Procedure: COLONOSCOPY WITH PROPOFOL;  Surgeon: Pasty Spillers, MD;  Location: [facility]  ENDOSCOPY;  Service: Endoscopy;  Laterality: N/A;   CORONARY ANGIOPLASTY     CORONARY ARTERY BYPASS GRAFT     x 3   01/2018   ESOPHAGOGASTRODUODENOSCOPY N/A 02/07/2020   Procedure: ESOPHAGOGASTRODUODENOSCOPY (EGD);  Surgeon: Pasty Spillers, MD;  Location: [facility] ENDOSCOPY;  Service: Endoscopy;  Laterality: N/A;   INCISION AND DRAINAGE Left 06/05/2022   Procedure: INCISION AND DRAINAGE;  Surgeon: Rosetta Posner, DPM;  Location: [facility] ORS;  Service: Podiatry;  Laterality: Left;   LEFT HEART CATH AND CORONARY ANGIOGRAPHY N/A 01/24/2018   Procedure: LEFT HEART CATH AND CORONARY ANGIOGRAPHY;  Surgeon: Iran Ouch, MD;  Location: [facility] INVASIVE CV LAB;  Service: Cardiovascular;  Laterality: N/A;   LOWER EXTREMITY ANGIOGRAPHY Left 06/08/2022   Procedure: Lower Extremity Angiography;  Surgeon: Annice Needy, MD;  Location: [facility] INVASIVE CV LAB;  Service: Cardiovascular;  Laterality: Left;   LOWER EXTREMITY INTERVENTION Right 03/24/2023   Procedure: LOWER EXTREMITY INTERVENTION;  Surgeon: Annice Needy, MD;  Location: [facility] INVASIVE CV LAB;  Service: Cardiovascular;  Laterality: Right;   SVT ABLATION N/A 09/12/2021   Procedure: SVT ABLATION;  Surgeon: Lanier Prude, MD;  Location: [facility] INVASIVE CV LAB;  Service: Cardiovascular;  Laterality: N/A;   TONSILLECTOMY     TRANSMETATARSAL AMPUTATION Left 08/04/2022   Procedure: TRANSMETATARSAL AMPUTATION;  Surgeon: Linus Galas, DPM;  Location: [facility] ORS;  Service: Podiatry;  Laterality: Left;    Current Outpatient Medications  Medication Instructions   amiodarone (PACERONE) 200 mg, Oral, Daily   ascorbic acid (VITAMIN C) 500 mg, Oral, 2 times daily   aspirin EC 81 mg, Oral, Daily   b complex vitamins tablet 1 tablet, Oral, Daily   clopidogrel (PLAVIX) 75 mg, Oral, Daily   dapagliflozin propanediol (FARXIGA) 10 mg, Oral, Daily before breakfast   Fish Oil 875 mg, Oral, 2 times daily   gabapentin (NEURONTIN) 100 mg, Oral, 2 times daily   insulin degludec  (TRESIBA) 32 Units, Daily at bedtime   losartan (COZAAR) 25 mg, Oral, Daily   metFORMIN (GLUCOPHAGE) 1,000 mg, Oral, 2 times daily   metoprolol succinate (TOPROL-XL) 50 mg, Oral, Daily, TAKE WITH OR IMMEDIATELY FOLLOWING A MEAL.   pramipexole (MIRAPEX) 1.5 mg, Oral, Daily   rosuvastatin (CRESTOR) 20 mg, Oral, Daily   spironolactone (ALDACTONE) 12.5 mg, Oral, Daily   torsemide (DEMADEX) 20 MG tablet Take 60mg  in the morning and 40mg  in the evening   Trulicity 1.5 mg, Subcutaneous, Weekly    Social History:  The patient  reports that he has never smoked. He has never used smokeless tobacco. He reports current alcohol use. He reports that he does not use drugs.   Family History:  The patient's family history includes CAD in his father; Cancer in his father and mother; Other in his brother.  ROS:  Please see the history of present illness. All other systems are reviewed and otherwise negative.   PHYSICAL EXAM:  VS:  BP 130/64 (BP Location: Left Arm, Patient Position: Sitting, Cuff Size: Normal)   Pulse 79  Ht 5\' 8"  (1.727 m)   Wt 218 lb 2 oz (98.9 kg)   SpO2 97%   BMI 33.17 kg/m  BMI: Body mass index is 33.17 kg/m.  GEN- The patient is well appearing, alert and oriented x 3 today.   Lungs- Clear to ausculation bilaterally, normal work of breathing.  Heart- Regular rate and rhythm, no murmurs, rubs or gallops Extremities- 1-2+ peripheral edema up to bilat knees, warm   EKG is not ordered. Personal review of EKG from  03/31/2023  shows:  NSR w 1st deg HB, RBBB PR QRS        Recent Labs: 05/27/2022: Magnesium 2.2 12/09/2022: TSH 4.261 12/30/2022: B Natriuretic Peptide 81.5 03/23/2023: ALT 15 03/24/2023: BUN 33; Creatinine, Ser 1.51; Hemoglobin 11.4; Platelets 188; Potassium 3.8; Sodium 137  12/09/2022: Cholesterol 109; HDL 28; LDL Cholesterol 52; Total CHOL/HDL Ratio 3.9; Triglycerides 147; VLDL 29   CrCl cannot be calculated (Patient's most recent lab result is older  than the maximum 21 days allowed.).   Wt Readings from Last 3 Encounters:  05/05/23 218 lb 2 oz (98.9 kg)  04/23/23 216 lb (98 kg)  03/31/23 213 lb (96.6 kg)     Additional studies reviewed include: Previous EP, cardiology notes.   TTE, 09/30/2022  1. Left ventricular ejection fraction, by estimation, is 45 to 50%. The left ventricle has mildly decreased function. The left ventricle demonstrates global hypokinesis. Left ventricular diastolic parameters are indeterminate.   2. Right ventricular systolic function is normal. The right ventricular size is normal.   3. Left atrial size was mild to moderately dilated.   4. The mitral valve has been repaired/replaced. No evidence of mitral valve regurgitation. The mean mitral valve gradient is 4.0 mmHg. There is a prosthetic annuloplasty ring present in the mitral position. Procedure Date: 01/2018.   5. The aortic valve was not well visualized. Aortic valve regurgitation is not visualized. Aortic valve sclerosis is present, with no evidence of aortic valve stenosis.   6. The inferior vena cava is normal in size with greater than 50% respiratory variability, suggesting right atrial pressure of 3 mmHg.    ASSESSMENT AND PLAN:  #) SVT Maintaining sinus on amiodarone Continue 200mg  amiodarone daily Recommended updated Pulm follow-up Recommend yearly ophtho Update thyroid labs today  #) HFmrEF NYHA Brown  No SOB today, but significant lower extremity edema GDMT: farxiga 10mg , toprol 50mg , spiro 12.5, losartan 25mg  Diuretic: torsemide 60mg  in AM, 40mg  in PM Recommend he take PM torsemide when he gets home and elevate legs Consider increasing spiro at follow-up  #) CAD s/p CABG No chest pain or ischemic symptoms today Continue 20mg  crestor and fish oil       Current medicines are reviewed at length with the patient today.   The patient does not have concerns regarding his medicines.  The following changes were made today:  none  Labs/  tests ordered today include:  Orders Placed This Encounter  Procedures   T4, free   TSH     Disposition: Follow up with Dr. Lalla Brothers or EP APP in 6 months   Signed, Sherie Don, NP  05/05/23  2:04 PM  Electrophysiology [facility]

## 2023-05-05 NOTE — Patient Instructions (Addendum)
Medication Instructions:  The current medical regimen is effective;  continue present plan and medications.  *If you need a refill on your cardiac medications before your next appointment, please call your pharmacy*   Lab Work: Your provider would like for you to have following labs drawn today TSH, FREE T4.   If you have labs (blood work) drawn today and your tests are completely normal, you will receive your results only by: MyChart Message (if you have MyChart) OR A paper copy in the mail If you have any lab test that is abnormal or we need to change your treatment, we will call you to review the results.   Follow-Up: At [facility], you and your health needs are our priority.  As part of our continuing mission to provide you with exceptional heart care, we have created designated Provider Care Teams.  These Care Teams include your primary Cardiologist (physician) and Advanced Practice Providers (APPs -  Physician Assistants and Nurse Practitioners) who all work together to provide you with the care you need, when you need it.  We recommend signing up for the patient portal called "MyChart".  Sign up information is provided on this After Visit Summary.  MyChart is used to connect with patients for Virtual Visits (Telemedicine).  Patients are able to view lab/test results, encounter notes, upcoming appointments, etc.  Non-urgent messages can be sent to your provider as well.   To learn more about what you can do with MyChart, go to [email or web address].    Your next appointment:   6 month(s)  Provider:   Steffanie Dunn, MD or Sherie Don, NP     Make sure to schedule your Pulmonology appointment.

## 2023-05-06 LAB — TSH: TSH: 2.52 u[IU]/mL (ref 0.450–4.500)

## 2023-05-06 LAB — T4, FREE: Free T4: 1.42 ng/dL (ref 0.82–1.77)

## 2023-05-07 ENCOUNTER — Telehealth: Payer: Self-pay | Admitting: Gastroenterology

## 2023-05-07 NOTE — Telephone Encounter (Signed)
Check to find provider.

## 2023-05-13 ENCOUNTER — Telehealth: Payer: Self-pay | Admitting: *Deleted

## 2023-05-13 NOTE — Telephone Encounter (Addendum)
Received a fax back stating patient had an angio on 03/24/2023 , will need to delay until after 06/24/2023 before he can hold Plavix.

## 2023-05-13 NOTE — Telephone Encounter (Signed)
Message left for patient to return my call.  

## 2023-05-14 ENCOUNTER — Telehealth: Payer: Self-pay | Admitting: Cardiology

## 2023-05-14 MED ORDER — ROSUVASTATIN CALCIUM 20 MG PO TABS: 20.0000 mg | ORAL_TABLET | Freq: Every day | ORAL | 0 refills | Status: DC

## 2023-05-14 NOTE — Telephone Encounter (Signed)
*  STAT* If patient is at the pharmacy, call can be transferred to refill team.   1. Which medications need to be refilled? (please list name of each medication and dose if known)  rosuvastatin (CRESTOR) 20 MG tablet  2. Which pharmacy/location (including street and city if local pharmacy) is medication to be sent to? [facility] [address], [place] - [address]  3. Do they need a 30 day or 90 day supply?   90 day supply

## 2023-05-17 ENCOUNTER — Telehealth: Payer: Self-pay | Admitting: Cardiology

## 2023-05-17 NOTE — Progress Notes (Unsigned)
Virtual Visit via Telephone Note   Because of [name]'s co-morbid illnesses, he is at least at moderate risk for complications without adequate follow up.  This format is felt to be most appropriate for this patient at this time.  The patient did not have access to video technology/had technical difficulties with video requiring transitioning to audio format only (telephone).  All issues noted in this document were discussed and addressed.  No physical exam could be performed with this format.  Please refer to the patient's chart for his consent to telehealth for [facility].  Evaluation Performed:  Preoperative cardiovascular risk assessment _____________   Date:  05/17/2023   Patient ID:  Ricardo Brown, DOB [birth year], MRN [id number] Patient Location:  Home Provider location:   Office  Primary Care Provider:  Jacky Kindle, FNP Primary Cardiologist:  Debbe Odea, MD  Chief Complaint / Patient Profile   62 y.o. y/o male with a h/o SVT on AAD, chronic combined systolic and diastolic heart failure, CKD, CAD with late presenting inferior MI, s/p CABG x 3, diabetes, hyperlipidemia, hypertension, sleep apnea who is pending colonoscopy and presents today for telephonic preoperative cardiovascular risk assessment.  History of Present Illness    Ricardo Brown is a 62 y.o. male who presents via audio/video conferencing for a telehealth visit today.  Pt was last seen in cardiology clinic on 05/05/2023 by Sherie Don, NP.  At that time Ricardo Brown was having lower extremity swelling. He continues to have some LE edema and shortness of breath with moderate to intense exertion. He is able to achieve > 4 METS activity without concerning cardiac symptoms. The patient is now pending procedure as outlined above. Since his last visit, he denies chest pain, fatigue, palpitations, diaphoresis, weakness, presyncope, syncope, orthopnea, and  PND.   Past Medical History    Past Medical History:  Diagnosis Date   Chronic combined systolic (congestive) and diastolic (congestive) heart failure (HCC)    a. 01/2018 EF 35-40%; b. 10/2020 Echo: EF 40-45%, glob HK. RVSP 42.64mmHg. Mildly dil LA. Mild MS/AoV sclerosis.   CKD (chronic kidney disease), stage Brown (HCC)    Coronary artery disease    a. 01/2018 late presenting inferior MI; b. 01/2018 Cath: Severe multivessel dzs-->CABG x 3 (LIMA->LAD, VG->OM2, VG->RPDA @ [facility]).   Diabetes mellitus without complication (HCC)    a. Dx ~ 2000.   Heart palpitations    a. Pt reports prior nl echo's and stress tests. Last stress test w/in past 2 yrs - PCP.   High cholesterol    Hypertension    Mitral regurgitation    a. 01/2018 s/p MV repair @ time of CABG.   Myocardial infarction [facility])    Neuropathy    legs and hands   Postoperative atrial fibrillation    a. 01/2018 @ time of CABG.   PSVT (paroxysmal supraventricular tachycardia) (HCC)    a. Very symptomatic with multiple ED evaluations.  Has been on amio.   Severe sepsis with acute organ dysfunction (HCC) 12/19/2021   Sleep apnea    "mild - has CPAP, doesn't use   Sleep difficulties 09/07/2019   STEMI (ST elevation myocardial infarction) (HCC) 01/24/2018   Wears dentures    full upper and lower   Past Surgical History:  Procedure Laterality Date   AMPUTATION TOE Right 08/29/2021   Procedure: AMPUTATION TOE-2nd toe;  Surgeon: Linus Galas, DPM;  Location: [facility];  Service: Podiatry;  Laterality:  Right;   AMPUTATION TOE Left 12/22/2021   Procedure: AMPUTATION TOE-2nd Toe Partial;  Surgeon: Rosetta Posner, DPM;  Location: [facility];  Service: Podiatry;  Laterality: Left;   CARDIAC CATHETERIZATION     CARDIAC VALVE REPLACEMENT     Mitral Valve Repair   CATARACT EXTRACTION W/PHACO Left 10/08/2022   Procedure: CATARACT EXTRACTION PHACO AND INTRAOCULAR LENS PLACEMENT (IOC) LEFT DIABETIC OMIDRIA 20.94 01:35.9;  Surgeon: Estanislado Pandy, MD;   Location: [facility];  Service: Ophthalmology;  Laterality: Left;  Diabetic   COLONOSCOPY WITH PROPOFOL N/A 06/07/2019   Procedure: COLONOSCOPY WITH PROPOFOL;  Surgeon: Pasty Spillers, MD;  Location: [facility];  Service: Endoscopy;  Laterality: N/A;   COLONOSCOPY WITH PROPOFOL N/A 02/07/2020   Procedure: COLONOSCOPY WITH PROPOFOL;  Surgeon: Pasty Spillers, MD;  Location: [facility];  Service: Endoscopy;  Laterality: N/A;   COLONOSCOPY WITH PROPOFOL N/A 12/11/2020   Procedure: COLONOSCOPY WITH PROPOFOL;  Surgeon: Pasty Spillers, MD;  Location: [facility];  Service: Endoscopy;  Laterality: N/A;   CORONARY ANGIOPLASTY     CORONARY ARTERY BYPASS GRAFT     x 3   01/2018   ESOPHAGOGASTRODUODENOSCOPY N/A 02/07/2020   Procedure: ESOPHAGOGASTRODUODENOSCOPY (EGD);  Surgeon: Pasty Spillers, MD;  Location: [facility];  Service: Endoscopy;  Laterality: N/A;   INCISION AND DRAINAGE Left 06/05/2022   Procedure: INCISION AND DRAINAGE;  Surgeon: Rosetta Posner, DPM;  Location: [facility];  Service: Podiatry;  Laterality: Left;   LEFT HEART CATH AND CORONARY ANGIOGRAPHY N/A 01/24/2018   Procedure: LEFT HEART CATH AND CORONARY ANGIOGRAPHY;  Surgeon: Iran Ouch, MD;  Location: [facility];  Service: Cardiovascular;  Laterality: N/A;   LOWER EXTREMITY ANGIOGRAPHY Left 06/08/2022   Procedure: Lower Extremity Angiography;  Surgeon: Annice Needy, MD;  Location: [facility];  Service: Cardiovascular;  Laterality: Left;   LOWER EXTREMITY INTERVENTION Right 03/24/2023   Procedure: LOWER EXTREMITY INTERVENTION;  Surgeon: Annice Needy, MD;  Location: [facility];  Service: Cardiovascular;  Laterality: Right;   SVT ABLATION N/A 09/12/2021   Procedure: SVT ABLATION;  Surgeon: Lanier Prude, MD;  Location: [facility];  Service: Cardiovascular;  Laterality: N/A;   TONSILLECTOMY     TRANSMETATARSAL AMPUTATION Left 08/04/2022   Procedure:  TRANSMETATARSAL AMPUTATION;  Surgeon: Linus Galas, DPM;  Location: [facility];  Service: Podiatry;  Laterality: Left;    Allergies  Allergies  Allergen Reactions   Lipitor [Atorvastatin] Palpitations    SVT   Sacubitril-Valsartan Other (See Comments)    SVT   Pregabalin Palpitations    SVT    Home Medications    Prior to Admission medications   Medication Sig Start Date End Date Taking? Authorizing Provider  amiodarone (PACERONE) 200 MG tablet Take 1 tablet (200 mg total) by mouth daily. 04/06/23   Debbe Odea, MD  ascorbic acid (VITAMIN C) 500 MG tablet Take 1 tablet (500 mg total) by mouth 2 (two) times daily. 06/09/22   Rai, Delene Ruffini, MD  aspirin 81 MG EC tablet Take 81 mg by mouth daily.     [provider]  b complex vitamins tablet Take 1 tablet by mouth daily.     [provider]  clopidogrel (PLAVIX) 75 MG tablet Take 1 tablet (75 mg total) by mouth daily. 11/23/22   Georgiana Spinner, NP  dapagliflozin propanediol (FARXIGA) 10 MG TABS tablet Take 1 tablet (10 mg total) by mouth daily before breakfast. 12/09/22  Laurey Morale, MD  Dulaglutide (TRULICITY) 1.5 MG/0.5ML SOPN Inject 1.5 mg into the skin once a week.    [provider]  gabapentin (NEURONTIN) 100 MG capsule Take 1 capsule (100 mg total) by mouth 2 (two) times daily. 02/17/23   Jacky Kindle, FNP  insulin degludec (TRESIBA) 200 UNIT/ML FlexTouch Pen 32 Units at bedtime.    [provider]  losartan (COZAAR) 25 MG tablet Take 1 tablet (25 mg total) by mouth daily. 02/10/23   Debbe Odea, MD  metFORMIN (GLUCOPHAGE) 500 MG tablet Take 2 tablets (1,000 mg total) by mouth 2 (two) times daily. 10/23/21   Kara Dies, NP  metoprolol succinate (TOPROL-XL) 50 MG 24 hr tablet TAKE 1 TABLET BY MOUTH DAILY. TAKE WITH OR IMMEDIATELY FOLLOWING A MEAL. 06/29/22   Corky Downs, MD  Omega-3 Fatty Acids (FISH OIL) 875 MG CAPS Take 875 mg by mouth 2 (two) times daily.    [provider]  pramipexole (MIRAPEX) 1.5 MG tablet Take 1 tablet (1.5 mg total) by mouth daily. 03/24/23   Sunnie Nielsen, DO  rosuvastatin (CRESTOR) 20 MG tablet Take 1 tablet (20 mg total) by mouth daily. 05/14/23 08/12/23  Debbe Odea, MD  spironolactone (ALDACTONE) 25 MG tablet Take 0.5 tablets (12.5 mg total) by mouth daily. 12/30/22   Laurey Morale, MD  torsemide [facility]) 20 MG tablet Take 60mg  in the morning and 40mg  in the evening 12/30/22   Laurey Morale, MD    Physical Exam    Vital Signs:  Ricardo Brown does not have vital signs available for review today.  Given telephonic nature of communication, physical exam is limited. AAOx3. NAD. Normal affect.  Speech and respirations are unlabored.  Accessory Clinical Findings    None  Assessment & Plan    1.  Preoperative Cardiovascular Risk Assessment: According to the Revised Cardiac Risk Index (RCRI), his Perioperative Risk of Major Cardiac Event is (%): 11 secondary to insulin use, history of ischemic heart disease and CHF. He is overall well compensated. His Functional Capacity in METs is: 6.7 according to the [facility] Activity Status Index (DASI). The patient is doing well from a cardiac perspective. Therefore, based on ACC/AHA guidelines, the patient would be at acceptable risk for the planned procedure without further cardiovascular testing.   The patient was advised that if he develops new symptoms prior to surgery to contact our office to arrange for a follow-up visit, and he verbalized understanding.  Per office protocol, he may hold aspirin for 5-7 days prior to procedure and should resume as soon as hemodynamically stable postoperatively.   A copy of this note will be routed to requesting surgeon.  Time:   Today, I have spent 10 minutes with the patient with telehealth technology discussing medical history, symptoms, and management plan.    Levi Aland, NP-C  05/18/2023, 9:05 AM [address] Office [phone] Fax [phone]

## 2023-05-17 NOTE — Telephone Encounter (Signed)
Spoken to patient and notified him of the comments below.  Requesting to reschedule to 07/12/2023.  New clearance have been sent. New instructions will be sent.

## 2023-05-17 NOTE — Telephone Encounter (Signed)
Pre-operative Risk Assessment    Patient Name: Ricardo Brown  DOB: [birth year] MRN: [id number]      Request for Surgical Clearance    Procedure:   colonoscopy  Date of Surgery:  Clearance 06/07/23                                 Surgeon:  not indicated Surgeon's Group or Practice Name:  [facility]  Phone number:  [phone] Fax number:  [phone]   Type of Clearance Requested:   - Medical    Type of Anesthesia:  General    Additional requests/questions:    SignedShawna Orleans   05/17/2023, 10:18 AM

## 2023-05-17 NOTE — Telephone Encounter (Signed)
Surgery rescheduled to 07/12/23

## 2023-05-18 ENCOUNTER — Ambulatory Visit: Payer: 59 | Attending: Nurse Practitioner | Admitting: Nurse Practitioner

## 2023-05-18 ENCOUNTER — Encounter: Payer: Self-pay | Admitting: Nurse Practitioner

## 2023-05-18 DIAGNOSIS — Z01810 Encounter for preprocedural cardiovascular examination: Secondary | ICD-10-CM | POA: Diagnosis not present

## 2023-05-20 NOTE — Telephone Encounter (Signed)
Will notified patient by 07/05/2023 to hold aspirin as instructed from cardiology.  Waiting for pending clearance and Plavix directions from [facility].

## 2023-05-20 NOTE — Telephone Encounter (Signed)
Swinyer, Zachary George, NP Cardiology Preoperative cardiovascular examination Dx Referred by Jacky Kindle, FNP Reason for Visit      Progress Notes by Levi Aland, NP at 05/18/2023 9:00 AM  Author: Levi Aland, NP Author Type: Nurse Practitioner Filed: 05/18/2023  9:05 AM  Note Status: Signed Cosign: Cosign Not Required Encounter Date: 05/18/2023  Editor: Levi Aland, NP (Nurse Practitioner)             Expand All Collapse All     Virtual Visit via Telephone Note    Because of Ricardo Brown's co-morbid illnesses, he is at least at moderate risk for complications without adequate follow up.  This format is felt to be most appropriate for this patient at this time.  The patient did not have access to video technology/had technical difficulties with video requiring transitioning to audio format only (telephone).  All issues noted in this document were discussed and addressed.  No physical exam could be performed with this format.  Please refer to the patient's chart for his consent to telehealth for [facility].   Evaluation Performed:  Preoperative cardiovascular risk assessment _____________    Date:  05/17/2023    Patient ID:  Ricardo Brown, DOB 01/08/1961, MRN [id number] Patient Location:  Home Provider location:   Office   Primary Care Provider:  Jacky Kindle, FNP Primary Cardiologist:  Debbe Odea, MD   Chief Complaint / Patient Profile    62 y.o. y/o male with a h/o SVT on AAD, chronic combined systolic and diastolic heart failure, CKD, CAD with late presenting inferior MI, s/p CABG x 3, diabetes, hyperlipidemia, hypertension, sleep apnea who is pending colonoscopy and presents today for telephonic preoperative cardiovascular risk assessment.

## 2023-05-20 NOTE — Telephone Encounter (Signed)
Assessment & Plan    1.  Preoperative Cardiovascular Risk Assessment: According to the Revised Cardiac Risk Index (RCRI), his Perioperative Risk of Major Cardiac Event is (%): 11 secondary to insulin use, history of ischemic heart disease and CHF. He is overall well compensated. His Functional Capacity in METs is: 6.7 according to the [facility] Activity Status Index (DASI). The patient is doing well from a cardiac perspective. Therefore, based on ACC/AHA guidelines, the patient would be at acceptable risk for the planned procedure without further cardiovascular testing.    The patient was advised that if he develops new symptoms prior to surgery to contact our office to arrange for a follow-up visit, and he verbalized understanding.   Per office protocol, he may hold aspirin for 5-7 days prior to procedure and should resume as soon as hemodynamically stable postoperatively.     A copy of this note will be routed to requesting surgeon.   Time:   Today, I have spent 10 minutes with the patient with telehealth technology discussing medical history, symptoms, and management plan.     Ricardo Aland, NP-C  05/18/2023, 9:05 AM [address], [address] Office [phone] Fax [phone]

## 2023-05-23 ENCOUNTER — Other Ambulatory Visit: Payer: Self-pay | Admitting: Cardiology

## 2023-05-24 MED ORDER — LOSARTAN POTASSIUM 25 MG PO TABS: 25.0000 mg | ORAL_TABLET | Freq: Every day | ORAL | 0 refills | Status: DC

## 2023-05-25 ENCOUNTER — Encounter (INDEPENDENT_AMBULATORY_CARE_PROVIDER_SITE_OTHER): Payer: Self-pay

## 2023-05-25 ENCOUNTER — Ambulatory Visit (INDEPENDENT_AMBULATORY_CARE_PROVIDER_SITE_OTHER): Payer: Medicare Other | Admitting: Vascular Surgery

## 2023-05-25 ENCOUNTER — Encounter (INDEPENDENT_AMBULATORY_CARE_PROVIDER_SITE_OTHER): Payer: 59

## 2023-05-31 ENCOUNTER — Telehealth: Payer: Self-pay | Admitting: Family

## 2023-05-31 ENCOUNTER — Encounter: Payer: 59 | Admitting: Family

## 2023-05-31 NOTE — Progress Notes (Deleted)
PCP: Merita Norton, NP (last seen 08/24) Primary Cardiologist: Debbe Odea, MD (last seen 06/24) HF provider: Marca Ancona, MD (last seen 05/24)  HPI:  [name] is a 62 y/o male with a history of CKD, DM, hyperlipidemia, atrial tachycardia/ SVT, mitral valve repair with CABG 06/19, CAD s/p CABG 06/19, atrial fibrillation (post CABG), PAD, OSA and chronic heart failure..   Patient had a delayed presentation inferior MI 06/19. Cath showed totally occluded RCA, 90% mLAD, 90% pLCx.  s/p CABG x 3 with MV repair,  LIMA-LAD, SVG-OM2, SVG-PDA at [facility]. He has an ischemic cardiomyopathy, most recent echo in 2/24 showed EF 45-50%, normal RV, s/p MV repair with no MR and mean gradient 4 mmHg, IVC normal. He has PAD with below-the-knee PTCAs on left 11/23, then left TMA 01/24.  He had atrial fibrillation only noted at the time of CABG (post-op) and is not anticoagulated.    Admitted 03/22/23 due to concern for diabetic foot infection. No osteomyelitis on MRI, wound debrided by podiatry. On 03/24/23 angiography w/ balloon angioplasties R anterior tibia;, right tibioperoneal trunk and proximal posterior tibial artery. Initially given IV antibiotics. Per podiatry - No plan for surgical debridement or amputation at this time.      Echo 01/24/18: EF 35-40% with Grade II DD, mild/ moderate MR, mild LAE Echo 09/26/19: EF 40-45% with moderate LVH, mild LAE with prosthetic mitral valve Echo 10/29/20: EF 40-45% with mild LVH, mildly elevated PA pressure of 42.9 mmHg with repaired mitral valve with mean gradient of 7.0 mmHg Echo 12/17/21: EF 45-50% with Grade II DD with repaired mitral valve with gradient of 3.5 mmHg Echo 09/30/22: EF 45-50% with mitral gradient of 4.0 mmHg  LHC 01/24/18: Prox RCA to Mid RCA lesion is 20% stenosed. Dist RCA lesion is 100% stenosed. Mid Cx to Dist Cx lesion is 90% stenosed. Prox LAD lesion is 60% stenosed. Mid LAD lesion is 90% stenosed. Ost 2nd Diag to 2nd Diag lesion is 85%  stenosed. Mid LM to Dist LM lesion is 20% stenosed. Prox Cx lesion is 90% stenosed.  1.  Late presenting inferior ST elevation myocardial infarction of at least 48 hours duration.  The culprit is occluded distal right coronary artery with left to right collaterals noted especially to the right PDA.  In addition, there is diffuse diabetic three-vessel coronary artery disease affecting the proximal, mid and distal LAD as well as proximal and distal left circumflex. 2.  Moderately to severely elevated left ventricular end-diastolic pressure with an LVEDP of 29 to 30 mmHg.  Left ventricular angiography was not performed given renal failure.  He presents today for a HF f/u visit with a chief complaint of   Was unable to tolerate entresto in the past as he developed SVT with it.   ROS: All systems negative except as listed in HPI, PMH and Problem List.  SH:  Social History   Socioeconomic History   Marital status: Married    Spouse name: Not on file   Number of children: Not on file   Years of education: Not on file   Highest education level: Not on file  Occupational History   Not on file  Tobacco Use   Smoking status: Never   Smokeless tobacco: Never  Vaping Use   Vaping status: Never Used  Substance and Sexual Activity   Alcohol use: Yes    Comment: rare beer   Drug use: No   Sexual activity: Not on file  Other Topics Concern  Not on file  Social History Narrative   Lives locally with wife.  Works in [employer].  Does not routinely exercise.   Social Determinants of Health   Financial Resource Strain: Not on file  Food Insecurity: No Food Insecurity (03/22/2023)   Hunger Vital Sign    Worried About Running Out of Food in the Last Year: Never true    Ran Out of Food in the Last Year: Never true  Transportation Needs: No Transportation Needs (03/22/2023)   PRAPARE - Administrator, Civil Service (Medical): No    Lack of Transportation (Non-Medical): No   Physical Activity: Inactive (01/27/2018)   [facility]   Exercise Vital Sign    Days of Exercise per Week: 0 days    Minutes of Exercise per Session: 0 min  Stress: Not on file  Social Connections: Not on file  Intimate Partner Violence: Not At Risk (03/22/2023)   Humiliation, Afraid, Rape, and Kick questionnaire    Fear of Current or Ex-Partner: No    Emotionally Abused: No    Physically Abused: No    Sexually Abused: No    FH:  Family History  Problem Relation Age of Onset   Cancer Mother        died @ 64   CAD Father        First MI @ 25. S/p heart transplant. Died in mid-50's of cancer.   Cancer Father    Other Brother        alive and well    Past Medical History:  Diagnosis Date   Chronic combined systolic (congestive) and diastolic (congestive) heart failure (HCC)    a. 01/2018 EF 35-40%; b. 10/2020 Echo: EF 40-45%, glob HK. RVSP 42.63mmHg. Mildly dil LA. Mild MS/AoV sclerosis.   CKD (chronic kidney disease), stage III (HCC)    Coronary artery disease    a. 01/2018 late presenting inferior MI; b. 01/2018 Cath: Severe multivessel dzs-->CABG x 3 (LIMA->LAD, VG->OM2, VG->RPDA @ [facility]).   Diabetes mellitus without complication (HCC)    a. Dx ~ 2000.   Heart palpitations    a. Pt reports prior nl echo's and stress tests. Last stress test w/in past 2 yrs - PCP.   High cholesterol    Hypertension    Mitral regurgitation    a. 01/2018 s/p MV repair @ time of CABG.   Myocardial infarction [facility])    Neuropathy    legs and hands   Postoperative atrial fibrillation    a. 01/2018 @ time of CABG.   PSVT (paroxysmal supraventricular tachycardia) (HCC)    a. Very symptomatic with multiple ED evaluations.  Has been on amio.   Severe sepsis with acute organ dysfunction (HCC) 12/19/2021   Sleep apnea    "mild - has CPAP, doesn't use   Sleep difficulties 09/07/2019   STEMI (ST elevation myocardial infarction) (HCC)  01/24/2018   Wears dentures    full upper and lower    Current Outpatient Medications  Medication Sig Dispense Refill   amiodarone (PACERONE) 200 MG tablet Take 1 tablet (200 mg total) by mouth daily. 90 tablet 0   ascorbic acid (VITAMIN C) 500 MG tablet Take 1 tablet (500 mg total) by mouth 2 (two) times daily. 60 tablet 3   aspirin 81 MG EC tablet Take 81 mg by mouth daily.      b complex vitamins tablet Take 1 tablet by mouth daily.  clopidogrel (PLAVIX) 75 MG tablet Take 1 tablet (75 mg total) by mouth daily. 30 tablet 6   dapagliflozin propanediol (FARXIGA) 10 MG TABS tablet Take 1 tablet (10 mg total) by mouth daily before breakfast. 30 tablet 6   Dulaglutide (TRULICITY) 1.5 MG/0.5ML SOPN Inject 1.5 mg into the skin once a week.     gabapentin (NEURONTIN) 100 MG capsule Take 1 capsule (100 mg total) by mouth 2 (two) times daily. 200 capsule 3   insulin degludec (TRESIBA) 200 UNIT/ML FlexTouch Pen 32 Units at bedtime.     losartan (COZAAR) 25 MG tablet Take 1 tablet (25 mg total) by mouth daily. 90 tablet 0   metFORMIN (GLUCOPHAGE) 500 MG tablet Take 2 tablets (1,000 mg total) by mouth 2 (two) times daily. 360 tablet 2   metoprolol succinate (TOPROL-XL) 50 MG 24 hr tablet TAKE 1 TABLET BY MOUTH DAILY. TAKE WITH OR IMMEDIATELY FOLLOWING A MEAL. 90 tablet 0   Omega-3 Fatty Acids (FISH OIL) 875 MG CAPS Take 875 mg by mouth 2 (two) times daily.     pramipexole (MIRAPEX) 1.5 MG tablet Take 1 tablet (1.5 mg total) by mouth daily.     rosuvastatin (CRESTOR) 20 MG tablet Take 1 tablet (20 mg total) by mouth daily. 90 tablet 0   spironolactone (ALDACTONE) 25 MG tablet Take 0.5 tablets (12.5 mg total) by mouth daily. 45 tablet 3   torsemide (DEMADEX) 20 MG tablet Take 60mg  in the morning and 40mg  in the evening     No current facility-administered medications for this visit.   Facility-Administered Medications Ordered in Other Visits  Medication Dose Route Frequency Provider Last Rate Last  Admin   Heparin (Porcine) in NaCl 2000-0.9 UNIT/L-% SOLN    PRN Annice Needy, MD   1,000 mL at 03/24/23 1040   iodixanol (VISIPAQUE) 320 MG/ML injection    PRN Annice Needy, MD   50 mL at 03/24/23 1040   lidocaine (PF) (XYLOCAINE) 1 % injection    PRN Annice Needy, MD   10 mL at 03/24/23 1040     PHYSICAL EXAM:  General:  Well appearing. No resp difficulty HEENT: normal Neck: supple. JVP flat. No lymphadenopathy or thryomegaly appreciated. Cor: PMI normal. Regular rate & rhythm. No rubs, gallops or murmurs. Lungs: clear Abdomen: soft, nontender, nondistended. No hepatosplenomegaly. No bruits or masses.  Extremities: no cyanosis, clubbing, rash, 1+ pitting edema bilateral lower legs to just below knee; right foot wrapped Neuro: alert & oriented x3, cranial nerves grossly intact. Moves all 4 extremities w/o difficulty. Affect pleasant.   ECG: not done     ASSESSMENT & PLAN:  1. Ischemic heart failure with mildly reduced ejection fraction- - NYHA class II - minimally fluid overload with pedal edema; difficult to diuresis much due to CKD - weighing daily  - weight 213 pounds from last visit here 2 months ago - Echo 09/26/19: EF 40-45% with moderate LVH, mild LAE with prosthetic mitral valve - Echo 10/29/20: EF 40-45% with mild LVH, mildly elevated PA pressure of 42.9 mmHg with repaired mitral valve with mean gradient of 7.0 mmHg - Echo 12/17/21: EF 45-50% with Grade II DD with repaired mitral valve with gradient of 3.5 mmHg - Echo 09/30/22: EF 45-50% with mitral gradient of 4.0 mmHg  - Continue Farxiga 10 daily - Continue losartan 25 mg daily. Entresto caused SVT - Continue Toprol XL 50 mg daily - continue spironolactone 12.5mg  - continue torsemide 60mg  AM/ 40mg  PM  - No metolazone -  encouraged to elevate legs when sitting for long periods of time - BNP 12/30/22 was 81.5  2: HTN- - BP  - saw PCP Suzie Portela) 08/24 - saw nephrology (Korrapati) 08/24 - BMP 03/24/23 showed sodium 137,  potassium 3.8, creatinine 1.51 & GFR 52  3: CAD- - saw cardiology (Agbor-Etang) 06/24 - Late presentation inferior MI in 6/19 with CABG x 3 at [facility].  No chest pain.  - Continue ASA 81 and Plavix 75 daily for now.  Could consider dropping ASA in the future.  - continue rosuvastatin 20mg  daily - LDL 12/09/22 was 52  4: DM-  - saw endocrinology (Solum) 09/24 - A1c 03/22/23 was 7.1%  5: SVT/atrial tachycardia- - saw EP (Riddle) 10/24 - Continue amiodarone 200mg  daily for suppression - LFTs 03/23/23 looked good - TSH 12/09/22 was normal - He will need regular eye exam.   6: PAD- - Had PTCAs to below the knee arteries on left in 11/23.  - In 1/24, he had left transmetatarsal amputation. He is a nonsmoker.  - 03/24/23 angiography w/ balloon angioplasties R anterior tibia;, right tibioperoneal trunk and proximal posterior tibial artery - saw vascular Manson Passey) 09/24 - ABI's 09/24  7: Mitral regurgitation- - S/p MV repair with CABG 06/19 - Valve was stable on last echo.

## 2023-05-31 NOTE — Telephone Encounter (Signed)
Patient did not show for his [facility] appointment on 05/31/23.

## 2023-06-30 ENCOUNTER — Ambulatory Visit (INDEPENDENT_AMBULATORY_CARE_PROVIDER_SITE_OTHER): Payer: 59 | Admitting: Family Medicine

## 2023-06-30 DIAGNOSIS — Z91199 Patient's noncompliance with other medical treatment and regimen due to unspecified reason: Secondary | ICD-10-CM

## 2023-06-30 NOTE — Progress Notes (Signed)
Patient was not seen for appt d/t no call, no show, or late arrival >10 mins past appt time.   Elise T Payne, FNP  [facility] [address] [place], [place] [address] [phone] (phone) [phone] (fax) [facility]  

## 2023-07-09 ENCOUNTER — Ambulatory Visit: Payer: 59 | Attending: Cardiology | Admitting: Cardiology

## 2023-07-09 ENCOUNTER — Encounter: Payer: Self-pay | Admitting: Cardiology

## 2023-07-09 ENCOUNTER — Encounter: Payer: Self-pay | Admitting: Gastroenterology

## 2023-07-09 VITALS — BP 132/72 | HR 71 | Ht 68.0 in | Wt 217.4 lb

## 2023-07-09 DIAGNOSIS — I255 Ischemic cardiomyopathy: Secondary | ICD-10-CM | POA: Diagnosis not present

## 2023-07-09 DIAGNOSIS — Z951 Presence of aortocoronary bypass graft: Secondary | ICD-10-CM

## 2023-07-09 DIAGNOSIS — I10 Essential (primary) hypertension: Secondary | ICD-10-CM

## 2023-07-09 DIAGNOSIS — I471 Supraventricular tachycardia, unspecified: Secondary | ICD-10-CM | POA: Diagnosis not present

## 2023-07-09 NOTE — Progress Notes (Signed)
Cardiology Office Note:    Date:  07/09/2023   ID:  Ricardo Brown, DOB [birth year], MRN [id number]  PCP:  Ricardo Kindle, FNP  Cardiologist:  Ricardo Odea, MD  Electrophysiologist:  Ricardo Prude, MD   Referring MD: Ricardo Kindle, FNP   Chief Complaint  Patient presents with   Follow-up    Patient denies new or acute cardiac problems/concerns today.     History of Present Illness:    Ricardo Brown is a 62 y.o. male with a hx of SVT on amio , CAD/CABG x3 in 2019, HFrEF (initial EF 40-45%, last EF 45-50%), hypertension, hyperlipidemia, diabetes, CKD-3 who presents for follow-up.   Feels well, denies chest pain or shortness of breath.  Compliant with medications as prescribed.  No edema, no adverse effects from medications.  Denies any cardiac concerns at this time   Prior notes Echo 12/2021 EF 45 to 50% Echo 10/2020 EF 40 to 45%, grade 2 diastolic dysfunction. Patient was seen in the hospital/[facility] in 06/ 2019 due to nausea, weakness, myalgias.  EKG at the time showed inferior ST elevation with reciprocal changes in the anterior leads and small inferior Q waves.  Patient was deemed late presenting STEMI.   Left heart cath was performed, briefly showed occluded distal RCA, 90% mid LAD, 90% proximal left circumflex.  Due to patient being a diabetic, and left heart cath showing diffuse three-vessel disease, CABG was recommended.  He underwent coronary artery bypass grafting x3 in July 2019 at [facility]. Aldactone caused worsening renal dysfunction, this was stopped.  Ricardo Brown previously tried on patient, he did not tolerate.  He developed feeling of unwell, went into SVT and was taken to the emergency room.  Patient with history of SVT, who was seen in the emergency room and adenosine given x2 and placed on amiodarone  Echo on 09/2019 showed mild to moderately reduced ejection fraction, EF 40 to 45%.  Past Medical History:  Diagnosis Date   Chronic  combined systolic (congestive) and diastolic (congestive) heart failure (HCC)    a. 01/2018 EF 35-40%; b. 10/2020 Echo: EF 40-45%, glob HK. RVSP 42.63mmHg. Mildly dil LA. Mild MS/AoV sclerosis.   CKD (chronic kidney disease), stage Brown (HCC)    Coronary artery disease    a. 01/2018 late presenting inferior MI; b. 01/2018 Cath: Severe multivessel dzs-->CABG x 3 (LIMA->LAD, VG->OM2, VG->RPDA @ [facility]).   Diabetes mellitus without complication (HCC)    a. Dx ~ 2000.   Heart palpitations    a. Pt reports prior nl echo's and stress tests. Last stress test w/in past 2 yrs - PCP.   High cholesterol    Hypertension    Mitral regurgitation    a. 01/2018 s/p MV repair @ time of CABG.   Myocardial infarction [facility])    Neuropathy    legs and hands   Postoperative atrial fibrillation    a. 01/2018 @ time of CABG.   PSVT (paroxysmal supraventricular tachycardia) (HCC)    a. Very symptomatic with multiple ED evaluations.  Has been on amio.   Severe sepsis with acute organ dysfunction (HCC) 12/19/2021   Sleep apnea    "mild - has CPAP, doesn't use   Sleep difficulties 09/07/2019   STEMI (ST elevation myocardial infarction) (HCC) 01/24/2018   Wears dentures    full upper and lower    Past Surgical History:  Procedure Laterality Date   AMPUTATION TOE Right 08/29/2021   Procedure: AMPUTATION TOE-2nd toe;  Surgeon:  Ricardo Brown, DPM;  Location: [facility] ORS;  Service: Podiatry;  Laterality: Right;   AMPUTATION TOE Left 12/22/2021   Procedure: AMPUTATION TOE-2nd Toe Partial;  Surgeon: Ricardo Brown, DPM;  Location: [facility];  Service: Podiatry;  Laterality: Left;   CARDIAC CATHETERIZATION     CARDIAC VALVE REPLACEMENT     Mitral Valve Repair   CATARACT EXTRACTION W/PHACO Left 10/08/2022   Procedure: CATARACT EXTRACTION PHACO AND INTRAOCULAR LENS PLACEMENT (IOC) LEFT DIABETIC OMIDRIA 20.94 01:35.9;  Surgeon: Ricardo Pandy, MD;  Location: [facility];  Service: Ophthalmology;  Laterality: Left;   Diabetic   COLONOSCOPY WITH PROPOFOL N/A 06/07/2019   Procedure: COLONOSCOPY WITH PROPOFOL;  Surgeon: Ricardo Spillers, MD;  Location: [facility] ENDOSCOPY;  Service: Endoscopy;  Laterality: N/A;   COLONOSCOPY WITH PROPOFOL N/A 02/07/2020   Procedure: COLONOSCOPY WITH PROPOFOL;  Surgeon: Ricardo Spillers, MD;  Location: [facility] ENDOSCOPY;  Service: Endoscopy;  Laterality: N/A;   COLONOSCOPY WITH PROPOFOL N/A 12/11/2020   Procedure: COLONOSCOPY WITH PROPOFOL;  Surgeon: Ricardo Spillers, MD;  Location: [facility] ENDOSCOPY;  Service: Endoscopy;  Laterality: N/A;   CORONARY ANGIOPLASTY     CORONARY ARTERY BYPASS GRAFT     x 3   01/2018   ESOPHAGOGASTRODUODENOSCOPY N/A 02/07/2020   Procedure: ESOPHAGOGASTRODUODENOSCOPY (EGD);  Surgeon: Ricardo Spillers, MD;  Location: [facility] END[facility];  Service: Endoscopy;  Laterality: N/A;   INCISION AND DRAINAGE Left 06/05/2022   Procedure: INCISION AND DRAINAGE;  Surgeon: Ricardo Brown, DPM;  Location: [facility] ORS;  Service: Podiatry;  Laterality: Left;   LEFT HEART CATH AND CORONARY ANGIOGRAPHY N/A 01/24/2018   Procedure: LEFT HEART CATH AND CORONARY ANGIOGRAPHY;  Surgeon: Ricardo Ouch, MD;  Location: [facility] INVASIVE CV LAB;  Service: Cardiovascular;  Laterality: N/A;   LOWER EXTREMITY ANGIOGRAPHY Left 06/08/2022   Procedure: Lower Extremity Angiography;  Surgeon: Ricardo Needy, MD;  Location: [facility] INVASIVE CV LAB;  Service: Cardiovascular;  Laterality: Left;   LOWER EXTREMITY INTERVENTION Right 03/24/2023   Procedure: LOWER EXTREMITY INTERVENTION;  Surgeon: Ricardo Needy, MD;  Location: [facility] INVASIVE CV LAB;  Service: Cardiovascular;  Laterality: Right;   SVT ABLATION N/A 09/12/2021   Procedure: SVT ABLATION;  Surgeon: Ricardo Prude, MD;  Location: [facility] INVASIVE CV LAB;  Service: Cardiovascular;  Laterality: N/A;   TONSILLECTOMY     TRANSMETATARSAL AMPUTATION Left 08/04/2022   Procedure: TRANSMETATARSAL AMPUTATION;  Surgeon: Ricardo Brown, DPM;  Location: [facility];   Service: Podiatry;  Laterality: Left;    Current Medications: Current Meds  Medication Sig   amiodarone (PACERONE) 200 MG tablet Take 1 tablet (200 mg total) by mouth daily.   ascorbic acid (VITAMIN C) 500 MG tablet Take 1 tablet (500 mg total) by mouth 2 (two) times daily.   aspirin 81 MG EC tablet Take 81 mg by mouth daily.    b complex vitamins tablet Take 1 tablet by mouth daily.    clopidogrel (PLAVIX) 75 MG tablet Take 1 tablet (75 mg total) by mouth daily.   dapagliflozin propanediol (FARXIGA) 10 MG TABS tablet Take 1 tablet (10 mg total) by mouth daily before breakfast.   Dulaglutide (TRULICITY) 1.5 MG/0.5ML SOPN Inject 1.5 mg into the skin once a week.   gabapentin (NEURONTIN) 100 MG capsule Take 1 capsule (100 mg total) by mouth 2 (two) times daily.   insulin degludec (TRESIBA) 200 UNIT/ML FlexTouch Pen 32 Units at bedtime.   losartan (COZAAR) 25 MG tablet Take 1 tablet (25 mg total) by mouth daily.  metFORMIN (GLUCOPHAGE) 500 MG tablet Take 2 tablets (1,000 mg total) by mouth 2 (two) times daily.   metoprolol succinate (TOPROL-XL) 50 MG 24 hr tablet TAKE 1 TABLET BY MOUTH DAILY. TAKE WITH OR IMMEDIATELY FOLLOWING A MEAL.   Omega-3 Fatty Acids (FISH OIL) 875 MG CAPS Take 875 mg by mouth 2 (two) times daily.   pramipexole (MIRAPEX) 1.5 MG tablet Take 1 tablet (1.5 mg total) by mouth daily.   rosuvastatin (CRESTOR) 20 MG tablet Take 1 tablet (20 mg total) by mouth daily.   spironolactone (ALDACTONE) 25 MG tablet Take 0.5 tablets (12.5 mg total) by mouth daily.   torsemide (DEMADEX) 20 MG tablet Take 60mg  in the morning and 40mg  in the evening     Allergies:   Lipitor [atorvastatin], Sacubitril-valsartan, and Pregabalin   Social History   Socioeconomic History   Marital status: Married    Spouse name: Not on file   Number of children: Not on file   Years of education: Not on file   Highest education level: Not on file  Occupational History   Not on file  Tobacco Use    Smoking status: Never   Smokeless tobacco: Never  Vaping Use   Vaping status: Never Used  Substance and Sexual Activity   Alcohol use: Yes    Comment: rare beer   Drug use: No   Sexual activity: Not on file  Other Topics Concern   Not on file  Social History Narrative   Lives locally with wife.  Works in [employer].  Does not routinely exercise.   Social Determinants of Health   Financial Resource Strain: Medium Risk (06/28/2023)   Received from [facility]   Overall Financial Resource Strain (CARDIA)    Difficulty of Paying Living Expenses: Somewhat hard  Food Insecurity: Food Insecurity Present (06/28/2023)   Received from [facility]   Hunger Vital Sign    Worried About Running Out of Food in the Last Year: Sometimes true    Ran Out of Food in the Last Year: Sometimes true  Transportation Needs: No Transportation Needs (06/28/2023)   Received from [facility]    In the past 12 months, has lack of transportation kept you from medical appointments or from getting medications?: No    Lack of Transportation (Non-Medical): No  Physical Activity: Inactive (01/27/2018)   Received from [facility], [facility]   Exercise Vital Sign    Days of Exercise per Week: 0 days    Minutes of Exercise per Session: 0 min  Stress: Not on file  Social Connections: Not on file     Family History: The patient's family history includes CAD in his father; Cancer in his father and mother; Other in his brother.  ROS:   Please see the history of present illness.     All other systems reviewed and are negative.  EKGs/Labs/Other Studies Reviewed:    The following studies were reviewed today:   EKG Interpretation Date/Time:  Friday July 09 2023 10:02:46 EST Ventricular Rate:  71 PR Interval:  230 QRS Duration:  164 QT Interval:  482 QTC Calculation: 523 R  Axis:   66  Text Interpretation: Sinus rhythm with 1st degree A-V block with Premature atrial complexes Right bundle branch block Confirmed by Ricardo Brown ([id number]) on 07/09/2023 10:06:48 AM    Recent Labs: 12/30/2022: B Natriuretic Peptide 81.5 03/23/2023: ALT 15 03/24/2023: BUN 33; Creatinine,  Ser 1.51; Hemoglobin 11.4; Platelets 188; Potassium 3.8; Sodium 137 05/05/2023: TSH 2.520  Recent Lipid Panel    Component Value Date/Time   CHOL 109 12/09/2022 1210   TRIG 147 12/09/2022 1210   HDL 28 (L) 12/09/2022 1210   CHOLHDL 3.9 12/09/2022 1210   VLDL 29 12/09/2022 1210   LDLCALC 52 12/09/2022 1210   LDLCALC 67 09/30/2020 1114    Physical Exam:    VS:  BP 132/72 (BP Location: Left Arm, Patient Position: Sitting, Cuff Size: Large)   Pulse 71   Ht 5\' 8"  (1.727 m)   Wt 217 lb 6.4 oz (98.6 kg)   SpO2 99%   BMI 33.06 kg/m     Wt Readings from Last 3 Encounters:  07/09/23 217 lb 6.4 oz (98.6 kg)  05/05/23 218 lb 2 oz (98.9 kg)  04/23/23 216 lb (98 kg)     GEN:  Well nourished, well developed in no acute distress HEENT: Normal NECK: No JVD; No carotid bruits CARDIAC: RRR, no murmurs, rubs, gallops RESPIRATORY:  Clear to auscultation without rales, wheezing or rhonchi  ABDOMEN: Soft, non-tender, distended MUSCULOSKELETAL:  1+ edema; No deformity  SKIN: Warm and dry NEUROLOGIC:  Alert and oriented x 3 PSYCHIATRIC:  Normal affect   ASSESSMENT:    1. Hx of CABG   2. Ischemic cardiomyopathy   3. Primary hypertension   4. SVT (supraventricular tachycardia) (HCC)    PLAN:    In order of problems listed above:  CAD s/p CABG x3 in 2019. Denies chest pain.  LDL at goal.  Continue aspirin and Crestor 20 mg daily. ICM, echo 2/24 EF 45 - 50%.describes NYHA class II-Brown symptoms. Continue losartan 25 mg daily, Toprol-XL 50 mg daily, Aldactone 12.5 mg daily, torsemide 60 in a.m., 40 in p.m, Farxiga. did not tolerate Entresto in the past, went into SVT.  Hypertension, BP  controlled.  Continue Toprol-XL, losartan, Aldactone.   SVT, currently in sinus rhythm on amiodarone.  Continue amiodarone 200 mg daily.  Follow-up in 6-12 months.   Medication Adjustments/Labs and Tests Ordered: Current medicines are reviewed at length with the patient today.  Concerns regarding medicines are outlined above.  Orders Placed This Encounter  Procedures   EKG 12-Lead     No orders of the defined types were placed in this encounter.     Patient Instructions  Medication Instructions:   Your physician recommends that you continue on your current medications as directed. Please refer to the Current Medication list given to you today.  *If you need a refill on your cardiac medications before your next appointment, please call your pharmacy*   Lab Work:  None Ordered  If you have labs (blood work) drawn today and your tests are completely normal, you will receive your results only by: MyChart Message (if you have MyChart) OR A paper copy in the mail If you have any lab test that is abnormal or we need to change your treatment, we will call you to review the results.   Testing/Procedures:  None Ordered   Follow-Up: At [facility], you and your health needs are our priority.  As part of our continuing mission to provide you with exceptional heart care, we have created designated Provider Care Teams.  These Care Teams include your primary Cardiologist (physician) and Advanced Practice Providers (APPs -  Physician Assistants and Nurse Practitioners) who all work together to provide you with the care you need, when you need it.  We recommend signing up  for the patient portal called "MyChart".  Sign up information is provided on this After Visit Summary.  MyChart is used to connect with patients for Virtual Visits (Telemedicine).  Patients are able to view lab/test results, encounter notes, upcoming appointments, etc.  Non-urgent messages can be sent to your  provider as well.   To learn more about what you can do with MyChart, go to [email or web address].    Your next appointment:   12 month(s)  Provider:   You may see Ricardo Odea, MD or one of the following Advanced Practice Providers on your designated Care Team:   Nicolasa Ducking, NP Eula Listen, PA-C Cadence Fransico Michael, PA-C Charlsie Quest, NP Carlos Levering, NP    Signed, Ricardo Odea, MD  07/09/2023 10:43 AM    [facility]

## 2023-07-09 NOTE — Patient Instructions (Signed)
 Medication Instructions:   Your physician recommends that you continue on your current medications as directed. Please refer to the Current Medication list given to you today.  *If you need a refill on your cardiac medications before your next appointment, please call your pharmacy*   Lab Work:  None Ordered  If you have labs (blood work) drawn today and your tests are completely normal, you will receive your results only by: MyChart Message (if you have MyChart) OR A paper copy in the mail If you have any lab test that is abnormal or we need to change your treatment, we will call you to review the results.   Testing/Procedures:  None Ordered   Follow-Up: At [facility], you and your health needs are our priority.  As part of our continuing mission to provide you with exceptional heart care, we have created designated Provider Care Teams.  These Care Teams include your primary Cardiologist (physician) and Advanced Practice Providers (APPs -  Physician Assistants and Nurse Practitioners) who all work together to provide you with the care you need, when you need it.  We recommend signing up for the patient portal called "MyChart".  Sign up information is provided on this After Visit Summary.  MyChart is used to connect with patients for Virtual Visits (Telemedicine).  Patients are able to view lab/test results, encounter notes, upcoming appointments, etc.  Non-urgent messages can be sent to your provider as well.   To learn more about what you can do with MyChart, go to [email or web address].    Your next appointment:   12 month(s)  Provider:   You may see Debbe Odea, MD or one of the following Advanced Practice Providers on your designated Care Team:   Nicolasa Ducking, NP Eula Listen, PA-C Cadence Fransico Michael, PA-C Charlsie Quest, NP Carlos Levering, NP

## 2023-07-12 ENCOUNTER — Ambulatory Visit
Admission: RE | Admit: 2023-07-12 | Discharge: 2023-07-12 | Disposition: A | Discharge status: Home / Self Care | Payer: Medicare Other | Attending: Gastroenterology | Admitting: Gastroenterology

## 2023-07-12 ENCOUNTER — Encounter
Admission: RE | Disposition: A | Discharge status: Home / Self Care | Payer: Self-pay | Source: Home / Self Care | Attending: Gastroenterology

## 2023-07-12 ENCOUNTER — Ambulatory Visit: Payer: Medicare Other | Admitting: Certified Registered"

## 2023-07-12 DIAGNOSIS — Z7902 Long term (current) use of antithrombotics/antiplatelets: Secondary | ICD-10-CM | POA: Diagnosis not present

## 2023-07-12 DIAGNOSIS — I130 Hypertensive heart and chronic kidney disease with heart failure and stage 1 through stage 4 chronic kidney disease, or unspecified chronic kidney disease: Secondary | ICD-10-CM | POA: Diagnosis not present

## 2023-07-12 DIAGNOSIS — E1122 Type 2 diabetes mellitus with diabetic chronic kidney disease: Secondary | ICD-10-CM | POA: Insufficient documentation

## 2023-07-12 DIAGNOSIS — Z7984 Long term (current) use of oral hypoglycemic drugs: Secondary | ICD-10-CM | POA: Diagnosis not present

## 2023-07-12 DIAGNOSIS — G4730 Sleep apnea, unspecified: Secondary | ICD-10-CM | POA: Insufficient documentation

## 2023-07-12 DIAGNOSIS — Z09 Encounter for follow-up examination after completed treatment for conditions other than malignant neoplasm: Secondary | ICD-10-CM | POA: Insufficient documentation

## 2023-07-12 DIAGNOSIS — I252 Old myocardial infarction: Secondary | ICD-10-CM | POA: Diagnosis not present

## 2023-07-12 DIAGNOSIS — I5042 Chronic combined systolic (congestive) and diastolic (congestive) heart failure: Secondary | ICD-10-CM | POA: Diagnosis not present

## 2023-07-12 DIAGNOSIS — Z7985 Long-term (current) use of injectable non-insulin antidiabetic drugs: Secondary | ICD-10-CM | POA: Insufficient documentation

## 2023-07-12 DIAGNOSIS — Z1211 Encounter for screening for malignant neoplasm of colon: Secondary | ICD-10-CM

## 2023-07-12 DIAGNOSIS — N183 Chronic kidney disease, stage 3 unspecified: Secondary | ICD-10-CM | POA: Diagnosis not present

## 2023-07-12 DIAGNOSIS — I2510 Atherosclerotic heart disease of native coronary artery without angina pectoris: Secondary | ICD-10-CM | POA: Diagnosis not present

## 2023-07-12 DIAGNOSIS — Z860100 Personal history of colon polyps, unspecified: Secondary | ICD-10-CM

## 2023-07-12 DIAGNOSIS — D123 Benign neoplasm of transverse colon: Secondary | ICD-10-CM | POA: Insufficient documentation

## 2023-07-12 DIAGNOSIS — Z794 Long term (current) use of insulin: Secondary | ICD-10-CM | POA: Diagnosis not present

## 2023-07-12 DIAGNOSIS — Z951 Presence of aortocoronary bypass graft: Secondary | ICD-10-CM | POA: Diagnosis not present

## 2023-07-12 DIAGNOSIS — Z952 Presence of prosthetic heart valve: Secondary | ICD-10-CM | POA: Insufficient documentation

## 2023-07-12 HISTORY — PX: COLONOSCOPY WITH PROPOFOL: SHX5780

## 2023-07-12 HISTORY — PX: HEMOSTASIS CLIP PLACEMENT: SHX6857

## 2023-07-12 HISTORY — PX: POLYPECTOMY: SHX5525

## 2023-07-12 LAB — GLUCOSE, CAPILLARY: Glucose-Capillary: 87 mg/dL (ref 70–99)

## 2023-07-12 SURGERY — COLONOSCOPY WITH PROPOFOL
Anesthesia: General

## 2023-07-12 MED ORDER — SODIUM CHLORIDE 0.9 % IV SOLN: INTRAVENOUS | Status: DC

## 2023-07-12 MED ORDER — PROPOFOL 500 MG/50ML IV EMUL
INTRAVENOUS | Status: DC | PRN
  Administered 2023-07-12: 50 mg via INTRAVENOUS
  Administered 2023-07-12: 150 ug/kg/min via INTRAVENOUS

## 2023-07-12 MED ORDER — LIDOCAINE HCL (CARDIAC) PF 100 MG/5ML IV SOSY
PREFILLED_SYRINGE | INTRAVENOUS | Status: DC | PRN
  Administered 2023-07-12: 10 mg via INTRAVENOUS
  Administered 2023-07-12 (×3): 30 mg via INTRAVENOUS

## 2023-07-12 MED ORDER — LACTATED RINGERS IV SOLN: INTRAVENOUS | Status: DC | PRN

## 2023-07-12 NOTE — H&P (Signed)
Wyline Mood, MD [address], [address], [place], [place], [address] [address], [place], [place], [address] Phone: [phone]  Fax: [phone]  Primary Care Physician:  Jacky Kindle, FNP   Pre-Procedure History & Physical: HPI:  Ricardo Brown is a 62 y.o. male is here for an colonoscopy.   Past Medical History:  Diagnosis Date   Chronic combined systolic (congestive) and diastolic (congestive) heart failure (HCC)    a. 01/2018 EF 35-40%; b. 10/2020 Echo: EF 40-45%, glob HK. RVSP 42.54mmHg. Mildly dil LA. Mild MS/AoV sclerosis.   CKD (chronic kidney disease), stage Brown (HCC)    Coronary artery disease    a. 01/2018 late presenting inferior MI; b. 01/2018 Cath: Severe multivessel dzs-->CABG x 3 (LIMA->LAD, VG->OM2, VG->RPDA @ [facility]).   Diabetes mellitus without complication (HCC)    a. Dx ~ 2000.   Heart palpitations    a. Pt reports prior nl echo's and stress tests. Last stress test w/in past 2 yrs - PCP.   High cholesterol    Hypertension    Mitral regurgitation    a. 01/2018 s/p MV repair @ time of CABG.   Myocardial infarction [facility])    Neuropathy    legs and hands   Postoperative atrial fibrillation    a. 01/2018 @ time of CABG.   PSVT (paroxysmal supraventricular tachycardia) (HCC)    a. Very symptomatic with multiple ED evaluations.  Has been on amio.   Severe sepsis with acute organ dysfunction (HCC) 12/19/2021   Sleep apnea    "mild - has CPAP, doesn't use   Sleep difficulties 09/07/2019   STEMI (ST elevation myocardial infarction) (HCC) 01/24/2018   Wears dentures    full upper and lower    Past Surgical History:  Procedure Laterality Date   AMPUTATION TOE Right 08/29/2021   Procedure: AMPUTATION TOE-2nd toe;  Surgeon: Linus Galas, DPM;  Location: [facility] ORS;  Service: Podiatry;  Laterality: Right;   AMPUTATION TOE Left 12/22/2021   Procedure: AMPUTATION TOE-2nd Toe Partial;  Surgeon: Rosetta Posner, DPM;  Location: [facility];  Service: Podiatry;   Laterality: Left;   CARDIAC CATHETERIZATION     CARDIAC VALVE REPLACEMENT     Mitral Valve Repair   CATARACT EXTRACTION W/PHACO Left 10/08/2022   Procedure: CATARACT EXTRACTION PHACO AND INTRAOCULAR LENS PLACEMENT (IOC) LEFT DIABETIC OMIDRIA 20.94 01:35.9;  Surgeon: Estanislado Pandy, MD;  Location: [facility];  Service: Ophthalmology;  Laterality: Left;  Diabetic   COLONOSCOPY WITH PROPOFOL N/A 06/07/2019   Procedure: COLONOSCOPY WITH PROPOFOL;  Surgeon: Pasty Spillers, MD;  Location: [facility] ENDOSCOPY;  Service: Endoscopy;  Laterality: N/A;   COLONOSCOPY WITH PROPOFOL N/A 02/07/2020   Procedure: COLONOSCOPY WITH PROPOFOL;  Surgeon: Pasty Spillers, MD;  Location: [facility] ENDOSCOPY;  Service: Endoscopy;  Laterality: N/A;   COLONOSCOPY WITH PROPOFOL N/A 12/11/2020   Procedure: COLONOSCOPY WITH PROPOFOL;  Surgeon: Pasty Spillers, MD;  Location: [facility] ENDOSCOPY;  Service: Endoscopy;  Laterality: N/A;   CORONARY ANGIOPLASTY     CORONARY ARTERY BYPASS GRAFT     x 3   01/2018   ESOPHAGOGASTRODUODENOSCOPY N/A 02/07/2020   Procedure: ESOPHAGOGASTRODUODENOSCOPY (EGD);  Surgeon: Pasty Spillers, MD;  Location: [facility];  Service: Endoscopy;  Laterality: N/A;   INCISION AND DRAINAGE Left 06/05/2022   Procedure: INCISION AND DRAINAGE;  Surgeon: Rosetta Posner, DPM;  Location: [facility];  Service: Podiatry;  Laterality: Left;   LEFT HEART CATH AND CORONARY ANGIOGRAPHY N/A 01/24/2018   Procedure: LEFT  HEART CATH AND CORONARY ANGIOGRAPHY;  Surgeon: Iran Ouch, MD;  Location: [facility];  Service: Cardiovascular;  Laterality: N/A;   LOWER EXTREMITY ANGIOGRAPHY Left 06/08/2022   Procedure: Lower Extremity Angiography;  Surgeon: Annice Needy, MD;  Location: [facility];  Service: Cardiovascular;  Laterality: Left;   LOWER EXTREMITY INTERVENTION Right 03/24/2023   Procedure: LOWER EXTREMITY INTERVENTION;  Surgeon: Annice Needy, MD;  Location: [facility];   Service: Cardiovascular;  Laterality: Right;   SVT ABLATION N/A 09/12/2021   Procedure: SVT ABLATION;  Surgeon: Lanier Prude, MD;  Location: [facility] INVASIVE CV LAB;  Service: Cardiovascular;  Laterality: N/A;   TONSILLECTOMY     TRANSMETATARSAL AMPUTATION Left 08/04/2022   Procedure: TRANSMETATARSAL AMPUTATION;  Surgeon: Linus Galas, DPM;  Location: [facility];  Service: Podiatry;  Laterality: Left;    Prior to Admission medications   Medication Sig Start Date End Date Taking? Authorizing Provider  amiodarone (PACERONE) 200 MG tablet Take 1 tablet (200 mg total) by mouth daily. 04/06/23   Debbe Odea, MD  ascorbic acid (VITAMIN C) 500 MG tablet Take 1 tablet (500 mg total) by mouth 2 (two) times daily. 06/09/22   Rai, Delene Ruffini, MD  aspirin 81 MG EC tablet Take 81 mg by mouth daily.     [provider]  b complex vitamins tablet Take 1 tablet by mouth daily.     [provider]  clopidogrel (PLAVIX) 75 MG tablet Take 1 tablet (75 mg total) by mouth daily. 11/23/22   Georgiana Spinner, NP  dapagliflozin propanediol (FARXIGA) 10 MG TABS tablet Take 1 tablet (10 mg total) by mouth daily before breakfast. 12/09/22   Laurey Morale, MD  Dulaglutide (TRULICITY) 1.5 MG/0.5ML SOPN Inject 1.5 mg into the skin once a week.    [provider]  gabapentin (NEURONTIN) 100 MG capsule Take 1 capsule (100 mg total) by mouth 2 (two) times daily. 02/17/23   Jacky Kindle, FNP  insulin degludec (TRESIBA) 200 UNIT/ML FlexTouch Pen 32 Units at bedtime.    [provider]  losartan (COZAAR) 25 MG tablet Take 1 tablet (25 mg total) by mouth daily. 05/24/23   Debbe Odea, MD  metFORMIN (GLUCOPHAGE) 500 MG tablet Take 2 tablets (1,000 mg total) by mouth 2 (two) times daily. 10/23/21   Kara Dies, NP  metoprolol succinate (TOPROL-XL) 50 MG 24 hr tablet TAKE 1 TABLET BY MOUTH DAILY. TAKE WITH OR IMMEDIATELY FOLLOWING A MEAL. 06/29/22   Corky Downs, MD  Omega-3 Fatty  Acids (FISH OIL) 875 MG CAPS Take 875 mg by mouth 2 (two) times daily.    [provider]  pramipexole (MIRAPEX) 1.5 MG tablet Take 1 tablet (1.5 mg total) by mouth daily. 03/24/23   Sunnie Nielsen, DO  rosuvastatin (CRESTOR) 20 MG tablet Take 1 tablet (20 mg total) by mouth daily. 05/14/23 08/12/23  Debbe Odea, MD  spironolactone (ALDACTONE) 25 MG tablet Take 0.5 tablets (12.5 mg total) by mouth daily. 12/30/22   Laurey Morale, MD  torsemide [facility]) 20 MG tablet Take 60mg  in the morning and 40mg  in the evening 12/30/22   Laurey Morale, MD    Allergies as of 04/27/2023 - Review Complete 04/26/2023  Allergen Reaction Noted   Lipitor [atorvastatin] Palpitations 04/23/2015   Sacubitril-valsartan Other (See Comments) 06/07/2019   Pregabalin Palpitations 10/02/2019    Family History  Problem Relation Age of Onset   Cancer Mother  died @ 33   CAD Father        First MI @ 49. S/p heart transplant. Died in mid-50's of cancer.   Cancer Father    Other Brother        alive and well    Social History   Socioeconomic History   Marital status: Married    Spouse name: Not on file   Number of children: Not on file   Years of education: Not on file   Highest education level: Not on file  Occupational History   Not on file  Tobacco Use   Smoking status: Never   Smokeless tobacco: Never  Vaping Use   Vaping status: Never Used  Substance and Sexual Activity   Alcohol use: Yes    Comment: rare beer   Drug use: No   Sexual activity: Not on file  Other Topics Concern   Not on file  Social History Narrative   Lives locally with wife.  Works in [employer].  Does not routinely exercise.   Social Determinants of Health   Financial Resource Strain: Medium Risk (06/28/2023)   Received from [facility]   Overall Financial Resource Strain (CARDIA)    Difficulty of Paying Living Expenses: Somewhat hard  Food Insecurity: Food Insecurity  Present (06/28/2023)   Received from [facility]   Hunger Vital Sign    Worried About Running Out of Food in the Last Year: Sometimes true    Ran Out of Food in the Last Year: Sometimes true  Transportation Needs: No Transportation Needs (06/28/2023)   Received from [facility] - Transportation    In the past 12 months, has lack of transportation kept you from medical appointments or from getting medications?: No    Lack of Transportation (Non-Medical): No  Physical Activity: Inactive (01/27/2018)   Received from [facility], [facility]   Exercise Vital Sign    Days of Exercise per Week: 0 days    Minutes of Exercise per Session: 0 min  Stress: Not on file  Social Connections: Not on file  Intimate Partner Violence: Not At Risk (03/22/2023)   Humiliation, Afraid, Rape, and Kick questionnaire    Fear of Current or Ex-Partner: No    Emotionally Abused: No    Physically Abused: No    Sexually Abused: No    Review of Systems: See HPI, otherwise negative ROS  Physical Exam: There were no vitals taken for this visit. General:   Alert,  pleasant and cooperative in NAD Head:  Normocephalic and atraumatic. Neck:  Supple; no masses or thyromegaly. Lungs:  Clear throughout to auscultation, normal respiratory effort.    Heart:  +S1, +S2, Regular rate and rhythm, No edema. Abdomen:  Soft, nontender and nondistended. Normal bowel sounds, without guarding, and without rebound.   Neurologic:  Alert and  oriented x4;  grossly normal neurologically.  Impression/Plan: Ricardo Brown is here for an colonoscopy to be performed for surveillance due to prior history of colon polyps   Risks, benefits, limitations, and alternatives regarding  colonoscopy have been reviewed with the patient.  Questions have been answered.  All parties agreeable.   Wyline Mood, MD  07/12/2023, 9:02 AM

## 2023-07-12 NOTE — Anesthesia Preprocedure Evaluation (Signed)
Anesthesia Evaluation  Patient identified by MRN, date of birth, ID band Patient awake    Reviewed: Allergy & Precautions, NPO status , Patient's Chart, lab work & pertinent test results  History of Anesthesia Complications Negative for: history of anesthetic complications  Airway Mallampati: III  TM Distance: >3 FB Neck ROM: full    Dental  (+) Edentulous Upper, Edentulous Lower   Pulmonary sleep apnea    Pulmonary exam normal        Cardiovascular hypertension, On Medications + CAD, + Past MI, + CABG, + Peripheral Vascular Disease and +CHF  + dysrhythmias Supra Ventricular Tachycardia + Valvular Problems/Murmurs (s/p repair) MR   Echo 09/2022 IMPRESSIONS     1. Left ventricular ejection fraction, by estimation, is 45 to 50%. The  left ventricle has mildly decreased function. The left ventricle  demonstrates global hypokinesis. Left ventricular diastolic parameters are  indeterminate.   2. Right ventricular systolic function is normal. The right ventricular  size is normal.   3. Left atrial size was mild to moderately dilated.   4. The mitral valve has been repaired/replaced. No evidence of mitral  valve regurgitation. The mean mitral valve gradient is 4.0 mmHg. There is  a prosthetic annuloplasty ring present in the mitral position. Procedure  Date: 01/2018.   5. The aortic valve was not well visualized. Aortic valve regurgitation  is not visualized. Aortic valve sclerosis is present, with no evidence of  aortic valve stenosis.   6. The inferior vena cava is normal in size with greater than 50%  respiratory variability, suggesting right atrial pressure of 3 mmHg.     Neuro/Psych negative neurological ROS  negative psych ROS   GI/Hepatic negative GI ROS, Neg liver ROS,,,  Endo/Other  negative endocrine ROSdiabetes    Renal/GU Renal disease  negative genitourinary   Musculoskeletal   Abdominal   Peds   Hematology negative hematology ROS (+)   Anesthesia Other Findings Past Medical History: No date: Chronic combined systolic (congestive) and diastolic  (congestive) heart failure (HCC)     Comment:  a. 01/2018 EF 35-40%; b. 10/2020 Echo: EF 40-45%, glob HK.              RVSP 42.79mmHg. Mildly dil LA. Mild MS/AoV sclerosis. No date: CKD (chronic kidney disease), stage III (HCC) No date: Coronary artery disease     Comment:  a. 01/2018 late presenting inferior MI; b. 01/2018 Cath:               Severe multivessel dzs-->CABG x 3 (LIMA->LAD, VG->OM2,               VG->RPDA @ [facility]). No date: Diabetes mellitus without complication (HCC)     Comment:  a. Dx ~ 2000. No date: Heart palpitations     Comment:  a. Pt reports prior nl echo's and stress tests. Last               stress test w/in past 2 yrs - PCP. No date: High cholesterol No date: Hypertension No date: Mitral regurgitation     Comment:  a. 01/2018 s/p MV repair @ time of CABG. No date: Myocardial infarction [facility]) No date: Neuropathy     Comment:  legs and hands No date: Postoperative atrial fibrillation     Comment:  a. 01/2018 @ time of CABG. No date: PSVT (paroxysmal supraventricular tachycardia) (HCC)     Comment:  a. Very symptomatic with multiple ED evaluations.  Has  been on amio. 12/19/2021: Severe sepsis with acute organ dysfunction (HCC) No date: Sleep apnea     Comment:  "mild - has CPAP, doesn't use 09/07/2019: Sleep difficulties 01/24/2018: STEMI (ST elevation myocardial infarction) (HCC) No date: Wears dentures     Comment:  full upper and lower  Past Surgical History: 08/29/2021: AMPUTATION TOE; Right     Comment:  Procedure: AMPUTATION TOE-2nd toe;  Surgeon: Linus Galas, DPM;  Location: [facility];  Service: Podiatry;                Laterality: Right; 12/22/2021: AMPUTATION TOE; Left     Comment:  Procedure: AMPUTATION TOE-2nd Toe Partial;  Surgeon:               Rosetta Posner, DPM;   Location: [facility];  Service:               Podiatry;  Laterality: Left; No date: CARDIAC CATHETERIZATION No date: CARDIAC VALVE REPLACEMENT     Comment:  Mitral Valve Repair 10/08/2022: CATARACT EXTRACTION W/PHACO; Left     Comment:  Procedure: CATARACT EXTRACTION PHACO AND INTRAOCULAR               LENS PLACEMENT (IOC) LEFT DIABETIC OMIDRIA 20.94 01:35.9;              Surgeon: Estanislado Pandy, MD;  Location: [facility]               SUR[facility]ERY [facility];  Service: Ophthalmology;  Laterality: Left;              Diabetic 06/07/2019: COLONOSCOPY WITH PROPOFOL; N/A     Comment:  Procedure: COLONOSCOPY WITH PROPOFOL;  Surgeon:               Pasty Spillers, MD;  Location: [facility] ENDOSCOPY;                Service: Endoscopy;  Laterality: N/A; 02/07/2020: COLONOSCOPY WITH PROPOFOL; N/A     Comment:  Procedure: COLONOSCOPY WITH PROPOFOL;  Surgeon:               Pasty Spillers, MD;  Location: [facility] END[facility];                Service: Endoscopy;  Laterality: N/A; 12/11/2020: COLONOSCOPY WITH PROPOFOL; N/A     Comment:  Procedure: COLONOSCOPY WITH PROPOFOL;  Surgeon:               Pasty Spillers, MD;  Location: [facility] ENDOSCOPY;                Service: Endoscopy;  Laterality: N/A; No date: CORONARY ANGIOPLASTY No date: CORONARY ARTERY BYPASS GRAFT     Comment:  x 3   01/2018 02/07/2020: ESOPHAGOGASTRODUODENOSCOPY; N/A     Comment:  Procedure: ESOPHAGOGASTRODUODENOSCOPY (EGD);  Surgeon:               Pasty Spillers, MD;  Location: [facility] ENDOSCOPY;                Service: Endoscopy;  Laterality: N/A; 06/05/2022: INCISION AND DRAINAGE; Left     Comment:  Procedure: INCISION AND DRAINAGE;  Surgeon: Rosetta Posner, DPM;  Location: [facility];  Service: Podiatry;                Laterality: Left; 01/24/2018: LEFT HEART  CATH AND CORONARY ANGIOGRAPHY; N/A     Comment:  Procedure: LEFT HEART CATH AND CORONARY ANGIOGRAPHY;                Surgeon: Iran Ouch, MD;  Location: [facility]  INVASIVE               CV LAB;  Service: Cardiovascular;  Laterality: N/A; 06/08/2022: LOWER EXTREMITY ANGIOGRAPHY; Left     Comment:  Procedure: Lower Extremity Angiography;  Surgeon: Annice Needy, MD;  Location: [facility] INV[facility] CV LAB;  Service:               Cardiovascular;  Laterality: Left; 03/24/2023: LOWER EXTREMITY INTERVENTION; Right     Comment:  Procedure: LOWER EXTREMITY INTERVENTION;  Surgeon: Annice Needy, MD;  Location: [facility] IN[facility] CV [facility];  Service:               Cardiovascular;  Laterality: Right; 09/12/2021: SVT ABLATION; N/A     Comment:  Procedure: SVT ABLATION;  Surgeon: Lanier Prude,               MD;  Location: [facility] INV[facility] CV LAB;  Service:               Cardiovascular;  Laterality: N/A; No date: TONSILLECTOMY 08/04/2022: TRANSMETATARSAL AMPUTATION; Left     Comment:  Procedure: TRANSMETATARSAL AMPUTATION;  Surgeon: Linus Galas, DPM;  Location: [facility] OR[facility];  Service: Podiatry;                Laterality: Left;  BMI    Body Mass Index: 33.06 kg/m      Reproductive/Obstetrics negative OB ROS                              Anesthesia Physical Anesthesia Plan  ASA: 3  Anesthesia Plan: General   Post-op Pain Management: Minimal or no pain anticipated   Induction: Intravenous  PONV Risk Score and Plan: 2 and Propofol infusion and TIVA  Airway Management Planned: Natural Airway and Nasal Cannula  Additional Equipment:   Intra-op Plan:   Post-operative Plan:   Informed Consent: I have reviewed the patients History and Physical, chart, labs and discussed the procedure including the risks, benefits and alternatives for the proposed anesthesia with the patient or authorized representative who has indicated his/her understanding and acceptance.     Dental Advisory Given  Plan Discussed with: Anesthesiologist, CRNA and Surgeon  Anesthesia Plan Comments: (Patient consented for risks of  anesthesia including but not limited to:  - adverse reactions to medications - risk of airway placement if required - damage to eyes, teeth, lips or other oral mucosa - nerve damage due to positioning  - sore throat or hoarseness - Damage to heart, brain, nerves, lungs, other parts of body or loss of life  Patient voiced understanding and assent.)         Anesthesia Quick Evaluation

## 2023-07-12 NOTE — Op Note (Signed)
[facility] Gastroenterology Patient Name: Ricardo Brown Procedure Date: 07/12/2023 9:33 AM MRN: [id number] Account #: [id number] Date of Birth: Oct 31, 1960 Admit Type: Outpatient Age: 62 Room: [facility] ENDO ROOM 1 Gender: Male Note Status: Finalized Instrument Name: Prentice Docker [id number] Procedure:             Colonoscopy Indications:           Surveillance: Personal history of adenomatous polyps                         on last colonoscopy 3 years ago Providers:             Wyline Mood MD, MD Referring MD:          Daryl Eastern. Suzie Portela (Referring MD) Medicines:             Monitored Anesthesia Care Complications:         No immediate complications. Procedure:             Pre-Anesthesia Assessment:                        - Prior to the procedure, a History and Physical was                         performed, and patient medications, allergies and                         sensitivities were reviewed. The patient's tolerance                         of previous anesthesia was reviewed.                        - The risks and benefits of the procedure and the                         sedation options and risks were discussed with the                         patient. All questions were answered and informed                         consent was obtained.                        - ASA Grade Assessment: II - A patient with mild                         systemic disease.                        After obtaining informed consent, the colonoscope was                         passed under direct vision. Throughout the procedure,                         the patient's blood pressure, pulse, and oxygen                         saturations  were monitored continuously. The                         Colonoscope was introduced through the anus and                         advanced to the the cecum, identified by the                         appendiceal orifice. The colonoscopy was performed                          with ease. The patient tolerated the procedure well.                         The quality of the bowel preparation was poor. The                         ileocecal valve, appendiceal orifice, and rectum were                         photographed. Findings:      The perianal and digital rectal examinations were normal.      An 8 mm polyp was found in the transverse colon. The polyp was sessile.       The polyp was removed with a cold snare. Resection and retrieval were       complete. To prevent bleeding after the polypectomy, one hemostatic clip       was successfully placed. Clip manufacturer: [employer]. There was       no bleeding at the end of the procedure.      Extensive amounts of semi-liquid stool was found in the entire colon,       interfering with visualization. Impression:            - Preparation of the colon was poor.                        - One 8 mm polyp in the transverse colon, removed with                         a cold snare. Resected and retrieved. Clip was placed.                         Clip manufacturer: [employer].                        - Stool in the entire examined colon. Recommendation:        - Discharge patient to home (with escort).                        - Resume previous diet.                        - Continue present medications.                        - Await pathology results.                        -  Repeat colonoscopy in 2 months because the bowel                         preparation was suboptimal. Procedure Code(s):     --- Professional ---                        [phone], Colonoscopy, flexible; with removal of                         tumor(s), polyp(s), or other lesion(s) by snare                         technique Diagnosis Code(s):     --- Professional ---                        Z86.010, Personal history of colonic polyps                        D12.3, Benign neoplasm of transverse colon (hepatic                         flexure  or splenic flexure) CPT copyright 2022 American Medical Association. All rights reserved. The codes documented in this report are preliminary and upon coder review may  be revised to meet current compliance requirements. Wyline Mood, MD Wyline Mood MD, MD 07/12/2023 10:08:17 AM This report has been signed electronically. Number of Addenda: 0 Note Initiated On: 07/12/2023 9:33 AM Scope Withdrawal Time: 0 hours 7 minutes 29 seconds  Total Procedure Duration: 0 hours 11 minutes 50 seconds  Estimated Blood Loss:  Estimated blood loss: none.      [facility]

## 2023-07-12 NOTE — Anesthesia Postprocedure Evaluation (Signed)
Anesthesia Post Note  Patient: Ricardo Brown  Procedure(s) Performed: COLONOSCOPY WITH PROPOFOL POLYPECTOMY HEMOSTASIS CLIP PLACEMENT  Patient location during evaluation: Endoscopy Anesthesia Type: General Level of consciousness: awake and alert Pain management: pain level controlled Vital Signs Assessment: post-procedure vital signs reviewed and stable Respiratory status: spontaneous breathing, nonlabored ventilation, respiratory function stable and patient connected to nasal cannula oxygen Cardiovascular status: blood pressure returned to baseline and stable Postop Assessment: no apparent nausea or vomiting Anesthetic complications: no   There were no known notable events for this encounter.   Last Vitals:  Vitals:   07/12/23 0929 07/12/23 1013  BP: (!) 147/72 (!) 101/59  Pulse: 76 62  Resp: 18 14  Temp: (!) 35.9 C (!) 35.9 C  SpO2: 100% 98%    Last Pain:  Vitals:   07/12/23 1023  TempSrc:   PainSc: 0-No pain                 Louie Boston

## 2023-07-12 NOTE — Transfer of Care (Signed)
Immediate Anesthesia Transfer of Care Note  Patient: Ricardo Brown  Procedure(s) Performed: COLONOSCOPY WITH PROPOFOL POLYPECTOMY HEMOSTASIS CLIP PLACEMENT  Patient Location: PACU  Anesthesia Type:General  Level of Consciousness: drowsy and patient cooperative  Airway & Oxygen Therapy: Patient Spontanous Breathing  Post-op Assessment: Report given to RN and Post -op Vital signs reviewed and stable  Post vital signs: stable  Last Vitals:  Vitals Value Taken Time  BP 101/59 07/12/23 1013  Temp 35.9 C 07/12/23 1013  Pulse 62 07/12/23 1014  Resp 17 07/12/23 1014  SpO2 97 % 07/12/23 1014  Vitals shown include unfiled device data.  Last Pain:  Vitals:   07/12/23 1013  TempSrc: Temporal  PainSc: Asleep         Complications: No notable events documented.

## 2023-07-13 ENCOUNTER — Encounter: Payer: Self-pay | Admitting: Gastroenterology

## 2023-07-13 ENCOUNTER — Ambulatory Visit: Payer: Medicare Other | Admitting: Adult Health

## 2023-07-13 LAB — SURGICAL PATHOLOGY

## 2023-07-15 ENCOUNTER — Ambulatory Visit: Payer: Medicare Other | Admitting: Family Medicine

## 2023-07-15 ENCOUNTER — Other Ambulatory Visit: Payer: Self-pay

## 2023-07-15 ENCOUNTER — Other Ambulatory Visit (INDEPENDENT_AMBULATORY_CARE_PROVIDER_SITE_OTHER): Payer: Self-pay | Admitting: Nurse Practitioner

## 2023-07-15 DIAGNOSIS — Z91199 Patient's noncompliance with other medical treatment and regimen due to unspecified reason: Secondary | ICD-10-CM

## 2023-07-15 MED ORDER — AMIODARONE HCL 200 MG PO TABS: 200.0000 mg | ORAL_TABLET | Freq: Every day | ORAL | 2 refills | Status: DC

## 2023-07-15 NOTE — Telephone Encounter (Signed)
Requested Prescriptions   Signed Prescriptions Disp Refills   amiodarone (PACERONE) 200 MG tablet 90 tablet 2    Sig: Take 1 tablet (200 mg total) by mouth daily.    Authorizing Provider: Debbe Odea    Ordering User: Guerry Minors    Last visit: 07/09/23 with plan to f/u in 6-12 months  next visit: none/active recall

## 2023-07-15 NOTE — Progress Notes (Signed)
Patient was not seen for appt d/t no call, no show, or late arrival >10 mins past appt time.   Elise T Payne, FNP  [facility] [address] [place], [place] [address] [phone] (phone) [phone] (fax) [facility]  

## 2023-07-20 ENCOUNTER — Ambulatory Visit (INDEPENDENT_AMBULATORY_CARE_PROVIDER_SITE_OTHER): Payer: Medicare Other | Admitting: Family Medicine

## 2023-07-20 VITALS — BP 130/67 | HR 76 | Ht 68.0 in | Wt 218.0 lb

## 2023-07-20 DIAGNOSIS — Z89422 Acquired absence of other left toe(s): Secondary | ICD-10-CM

## 2023-07-20 DIAGNOSIS — E1140 Type 2 diabetes mellitus with diabetic neuropathy, unspecified: Secondary | ICD-10-CM | POA: Diagnosis not present

## 2023-07-20 MED ORDER — METFORMIN HCL 500 MG PO TABS: 1000.0000 mg | ORAL_TABLET | Freq: Two times a day (BID) | ORAL | Status: AC

## 2023-07-20 MED ORDER — DAPAGLIFLOZIN PROPANEDIOL 10 MG PO TABS: 10.0000 mg | ORAL_TABLET | Freq: Every day | ORAL | 6 refills | Status: DC

## 2023-07-20 MED ORDER — LOSARTAN POTASSIUM 25 MG PO TABS: 25.0000 mg | ORAL_TABLET | Freq: Every day | ORAL | 0 refills | Status: DC

## 2023-07-20 NOTE — Assessment & Plan Note (Signed)
Further amputation needed; site dressed with off loading boot. Pt declines removal of dressing for exam. Encourage follow up with specialists Ensure edema remains stable in L LE

## 2023-07-20 NOTE — Assessment & Plan Note (Signed)
Chronic, stable Followed by podiatry and endo Reports BG has been relatively stable at home However poor/slow healing in L foot Continue to monitor

## 2023-07-20 NOTE — Progress Notes (Signed)
Established patient visit   Patient: Ricardo Brown   DOB: 1960-08-29   62 y.o. Male  MRN: [id number] Visit Date: 07/20/2023  Today's healthcare provider: Jacky Kindle, FNP  Introduced to nurse practitioner role and practice setting.  All questions answered.  Discussed provider/patient relationship and expectations.   Chief Complaint  Patient presents with   Follow-up   Subjective    HPI   BG 250s after meals with Libre 3; 110s-120s. Fasting ~115-145  Reports one low BG to 54; was not aware- drank OJ and it recovered  Medications: Outpatient Medications Prior to Visit  Medication Sig   amiodarone (PACERONE) 200 MG tablet Take 1 tablet (200 mg total) by mouth daily.   ascorbic acid (VITAMIN C) 500 MG tablet Take 1 tablet (500 mg total) by mouth 2 (two) times daily.   aspirin 81 MG EC tablet Take 81 mg by mouth daily.    b complex vitamins tablet Take 1 tablet by mouth daily.    clopidogrel (PLAVIX) 75 MG tablet Take 1 tablet by mouth once daily   Dulaglutide (TRULICITY) 1.5 MG/0.5ML SOPN Inject 1.5 mg into the skin once a week.   gabapentin (NEURONTIN) 100 MG capsule Take 1 capsule (100 mg total) by mouth 2 (two) times daily.   insulin degludec (TRESIBA) 200 UNIT/ML FlexTouch Pen 32 Units at bedtime.   metoprolol succinate (TOPROL-XL) 50 MG 24 hr tablet TAKE 1 TABLET BY MOUTH DAILY. TAKE WITH OR IMMEDIATELY FOLLOWING A MEAL.   Omega-3 Fatty Acids (FISH OIL) 875 MG CAPS Take 875 mg by mouth 2 (two) times daily.   pramipexole (MIRAPEX) 1.5 MG tablet Take 1 tablet (1.5 mg total) by mouth daily.   rosuvastatin (CRESTOR) 20 MG tablet Take 1 tablet (20 mg total) by mouth daily.   spironolactone (ALDACTONE) 25 MG tablet Take 0.5 tablets (12.5 mg total) by mouth daily.   torsemide (DEMADEX) 20 MG tablet Take 60mg  in the morning and 40mg  in the evening   [DISCONTINUED] dapagliflozin propanediol (FARXIGA) 10 MG TABS tablet Take 1 tablet (10 mg total) by mouth daily  before breakfast.   [DISCONTINUED] losartan (COZAAR) 25 MG tablet Take 1 tablet (25 mg total) by mouth daily.   [DISCONTINUED] metFORMIN (GLUCOPHAGE) 500 MG tablet Take 2 tablets (1,000 mg total) by mouth 2 (two) times daily.   [DISCONTINUED] Heparin (Porcine) in NaCl 2000-0.9 UNIT/L-% SOLN    [DISCONTINUED] iodixanol (VISIPAQUE) 320 MG/ML injection    [DISCONTINUED] lidocaine (PF) (XYLOCAINE) 1 % injection    No facility-administered medications prior to visit.    Review of Systems Last CBC Lab Results  Component Value Date   WBC 6.7 03/24/2023   HGB 11.4 (L) 03/24/2023   HCT 33.7 (L) 03/24/2023   MCV 86.6 03/24/2023   MCH 29.3 03/24/2023   RDW 14.0 03/24/2023   PLT 188 03/24/2023   Last metabolic panel Lab Results  Component Value Date   GLUCOSE 159 (H) 03/24/2023   NA 137 03/24/2023   K 3.8 03/24/2023   CL 102 03/24/2023   CO2 29 03/24/2023   BUN 33 (H) 03/24/2023   CREATININE 1.51 (H) 03/24/2023   GFRNONAA 52 (L) 03/24/2023   CALCIUM 8.3 (L) 03/24/2023   PHOS 3.9 05/27/2022   PROT 6.4 (L) 03/23/2023   ALBUMIN 3.3 (L) 03/23/2023   BILITOT 0.8 03/23/2023   ALKPHOS 44 03/23/2023   AST 16 03/23/2023   ALT 15 03/23/2023   ANIONGAP 6 03/24/2023   Last lipids Lab  Results  Component Value Date   CHOL 109 12/09/2022   HDL 28 (L) 12/09/2022   LDLCALC 52 12/09/2022   TRIG 147 12/09/2022   CHOLHDL 3.9 12/09/2022   Last hemoglobin A1c Lab Results  Component Value Date   HGBA1C 7.1 (H) 03/22/2023   Last thyroid functions Lab Results  Component Value Date   TSH 2.520 05/05/2023   Last vitamin D No results found for: "25OHVITD2", "25OHVITD3", "VD25OH" Last vitamin B12 and Folate No results found for: "VITAMINB12", "FOLATE"     Objective    BP 130/67 (BP Location: Right Arm, Patient Position: Sitting)   Pulse 76   Ht 5\' 8"  (1.727 m)   Wt 218 lb (98.9 kg)   SpO2 98%   BMI 33.15 kg/m   BP Readings from Last 3 Encounters:  07/20/23 130/67  07/12/23 (!)  101/59  07/09/23 132/72   Wt Readings from Last 3 Encounters:  07/20/23 218 lb (98.9 kg)  07/12/23 217 lb 6.4 oz (98.6 kg)  07/09/23 217 lb 6.4 oz (98.6 kg)   SpO2 Readings from Last 3 Encounters:  07/20/23 98%  07/12/23 98%  07/09/23 99%   Physical Exam Vitals and nursing note reviewed.  Constitutional:      Appearance: Normal appearance. He is obese.  HENT:     Head: Normocephalic and atraumatic.  Cardiovascular:     Rate and Rhythm: Normal rate and regular rhythm.     Pulses: Normal pulses.     Heart sounds: Normal heart sounds.  Pulmonary:     Effort: Pulmonary effort is normal.     Breath sounds: Normal breath sounds.  Musculoskeletal:        General: Tenderness present. Normal range of motion.     Cervical back: Normal range of motion.     Left lower leg: Edema present.     Comments: S/p partial foot amputation   Feet:     Comments: Pt declined foot exam; L foot wrapped and in off loading boot Skin:    General: Skin is warm and dry.     Capillary Refill: Capillary refill takes less than 2 seconds.  Neurological:     General: No focal deficit present.     Mental Status: He is alert and oriented to person, place, and time. Mental status is at baseline.  Psychiatric:        Mood and Affect: Mood normal.        Behavior: Behavior normal.        Thought Content: Thought content normal.        Judgment: Judgment normal.     No results found for any visits on 07/20/23.  Assessment & Plan     Problem List Items Addressed This Visit       Endocrine   Type 2 diabetes mellitus with diabetic neuropathy, without long-term current use of insulin (HCC)   Chronic, stable Followed by podiatry and endo Reports BG has been relatively stable at home However poor/slow healing in L foot Continue to monitor      Relevant Medications   dapagliflozin propanediol (FARXIGA) 10 MG TABS tablet   losartan (COZAAR) 25 MG tablet   metFORMIN (GLUCOPHAGE) 500 MG tablet      Other   Acquired absence of other left toe(s) (HCC) - Primary   Further amputation needed; site dressed with off loading boot. Pt declines removal of dressing for exam. Encourage follow up with specialists Ensure edema remains stable in L LE  Return in about 6 months (around 01/20/2024) for annual examination.     Leilani Merl, FNP, have reviewed all documentation for this visit. The documentation on 07/20/23 for the exam, diagnosis, procedures, and orders are all accurate and complete.  Jacky Kindle, FNP  [facility] [phone] (phone) [phone] (fax)  [facility]

## 2023-07-23 LAB — HM DIABETES EYE EXAM

## 2023-07-27 ENCOUNTER — Other Ambulatory Visit (INDEPENDENT_AMBULATORY_CARE_PROVIDER_SITE_OTHER): Payer: Self-pay | Admitting: Nurse Practitioner

## 2023-07-27 DIAGNOSIS — Z9889 Other specified postprocedural states: Secondary | ICD-10-CM

## 2023-07-29 ENCOUNTER — Telehealth: Payer: Self-pay

## 2023-07-29 ENCOUNTER — Other Ambulatory Visit: Payer: Self-pay

## 2023-07-29 DIAGNOSIS — Z860100 Personal history of colon polyps, unspecified: Secondary | ICD-10-CM

## 2023-07-29 MED ORDER — PEG 3350-KCL-NA BICARB-NACL 420 G PO SOLR: 4000.0000 mL | Freq: Once | ORAL | 0 refills | Status: AC

## 2023-07-29 NOTE — Telephone Encounter (Signed)
Left message to call office so we can schedule 2 month Colonoscopy per Dr.Anna-to check for any new polyps.

## 2023-07-30 ENCOUNTER — Ambulatory Visit (INDEPENDENT_AMBULATORY_CARE_PROVIDER_SITE_OTHER): Payer: 59

## 2023-07-30 ENCOUNTER — Encounter (INDEPENDENT_AMBULATORY_CARE_PROVIDER_SITE_OTHER): Payer: Self-pay | Admitting: Vascular Surgery

## 2023-07-30 ENCOUNTER — Ambulatory Visit (INDEPENDENT_AMBULATORY_CARE_PROVIDER_SITE_OTHER): Payer: Medicare Other | Admitting: Vascular Surgery

## 2023-07-30 VITALS — BP 105/69 | HR 76 | Resp 16 | Wt 211.2 lb

## 2023-07-30 DIAGNOSIS — I739 Peripheral vascular disease, unspecified: Secondary | ICD-10-CM | POA: Diagnosis not present

## 2023-07-30 DIAGNOSIS — E1140 Type 2 diabetes mellitus with diabetic neuropathy, unspecified: Secondary | ICD-10-CM | POA: Diagnosis not present

## 2023-07-30 DIAGNOSIS — I7025 Atherosclerosis of native arteries of other extremities with ulceration: Secondary | ICD-10-CM

## 2023-07-30 DIAGNOSIS — Z9889 Other specified postprocedural states: Secondary | ICD-10-CM | POA: Diagnosis not present

## 2023-07-30 DIAGNOSIS — E1159 Type 2 diabetes mellitus with other circulatory complications: Secondary | ICD-10-CM

## 2023-07-30 DIAGNOSIS — I152 Hypertension secondary to endocrine disorders: Secondary | ICD-10-CM

## 2023-07-30 NOTE — Assessment & Plan Note (Signed)
blood glucose control important in reducing the progression of atherosclerotic disease. Also, involved in wound healing. On appropriate medications.  

## 2023-07-30 NOTE — Assessment & Plan Note (Signed)
blood pressure control important in reducing the progression of atherosclerotic disease. On appropriate oral medications.  

## 2023-07-30 NOTE — Progress Notes (Signed)
MRN : [id number]  Ricardo Brown is a 62 y.o. (08-19-1960) male who presents with chief complaint of  Chief Complaint  Patient presents with   Follow-up    3 month ABI  .  History of Present Illness: Patient returns today in follow up of his PAD.  He is doing well.  His left foot wound is slowly healing but it has started to improve.  He has undergone bilateral tibial interventions in the past.  His ABIs today are 1.12 on the right and 1.14 on the left with multiphasic waveforms.  Current Outpatient Medications  Medication Sig Dispense Refill   amiodarone (PACERONE) 200 MG tablet Take 1 tablet (200 mg total) by mouth daily. 90 tablet 2   ascorbic acid (VITAMIN C) 500 MG tablet Take 1 tablet (500 mg total) by mouth 2 (two) times daily. 60 tablet 3   aspirin 81 MG EC tablet Take 81 mg by mouth daily.      b complex vitamins tablet Take 1 tablet by mouth daily.      clopidogrel (PLAVIX) 75 MG tablet Take 1 tablet by mouth once daily 90 tablet 0   dapagliflozin propanediol (FARXIGA) 10 MG TABS tablet Take 1 tablet (10 mg total) by mouth daily before breakfast. 30 tablet 6   Dulaglutide (TRULICITY) 1.5 MG/0.5ML SOPN Inject 1.5 mg into the skin once a week.     gabapentin (NEURONTIN) 100 MG capsule Take 1 capsule (100 mg total) by mouth 2 (two) times daily. 200 capsule 3   insulin degludec (TRESIBA) 200 UNIT/ML FlexTouch Pen 32 Units at bedtime.     losartan (COZAAR) 25 MG tablet Take 1 tablet (25 mg total) by mouth daily. 30 tablet 0   metFORMIN (GLUCOPHAGE) 500 MG tablet Take 2 tablets (1,000 mg total) by mouth 2 (two) times daily.     metoprolol succinate (TOPROL-XL) 50 MG 24 hr tablet TAKE 1 TABLET BY MOUTH DAILY. TAKE WITH OR IMMEDIATELY FOLLOWING A MEAL. 90 tablet 0   Omega-3 Fatty Acids (FISH OIL) 875 MG CAPS Take 875 mg by mouth 2 (two) times daily.     pramipexole (MIRAPEX) 1.5 MG tablet Take 1 tablet (1.5 mg total) by mouth daily.     rosuvastatin (CRESTOR) 20 MG tablet  Take 1 tablet (20 mg total) by mouth daily. 90 tablet 0   spironolactone (ALDACTONE) 25 MG tablet Take 0.5 tablets (12.5 mg total) by mouth daily. 45 tablet 3   torsemide (DEMADEX) 20 MG tablet Take 60mg  in the morning and 40mg  in the evening     torsemide (DEMADEX) 20 MG tablet Take 60mg  in the morning and 40mg  in the evening     No current facility-administered medications for this visit.    Past Medical History:  Diagnosis Date   Chronic combined systolic (congestive) and diastolic (congestive) heart failure (HCC)    a. 01/2018 EF 35-40%; b. 10/2020 Echo: EF 40-45%, glob HK. RVSP 42.80mmHg. Mildly dil LA. Mild MS/AoV sclerosis.   CKD (chronic kidney disease), stage Brown (HCC)    Coronary artery disease    a. 01/2018 late presenting inferior MI; b. 01/2018 Cath: Severe multivessel dzs-->CABG x 3 (LIMA->LAD, VG->OM2, VG->RPDA @ [facility]).   Diabetes mellitus without complication (HCC)    a. Dx ~ 2000.   Heart palpitations    a. Pt reports prior nl echo's and stress tests. Last stress test w/in past 2 yrs - PCP.   High cholesterol    Hypertension    Mitral  regurgitation    a. 01/2018 s/p MV repair @ time of CABG.   Myocardial infarction [facility])    Neuropathy    legs and hands   Postoperative atrial fibrillation    a. 01/2018 @ time of CABG.   PSVT (paroxysmal supraventricular tachycardia) (HCC)    a. Very symptomatic with multiple ED evaluations.  Has been on amio.   Severe sepsis with acute organ dysfunction (HCC) 12/19/2021   Sleep apnea    "mild - has CPAP, doesn't use   Sleep difficulties 09/07/2019   STEMI (ST elevation myocardial infarction) (HCC) 01/24/2018   Wears dentures    full upper and lower    Past Surgical History:  Procedure Laterality Date   AMPUTATION TOE Right 08/29/2021   Procedure: AMPUTATION TOE-2nd toe;  Surgeon: Linus Galas, DPM;  Location: [facility];  Service: Podiatry;  Laterality: Right;   AMPUTATION TOE Left 12/22/2021   Procedure: AMPUTATION TOE-2nd Toe  Partial;  Surgeon: Rosetta Posner, DPM;  Location: [facility];  Service: Podiatry;  Laterality: Left;   CARDIAC CATHETERIZATION     CARDIAC VALVE REPLACEMENT     Mitral Valve Repair   CATARACT EXTRACTION W/PHACO Left 10/08/2022   Procedure: CATARACT EXTRACTION PHACO AND INTRAOCULAR LENS PLACEMENT (IOC) LEFT DIABETIC OMIDRIA 20.94 01:35.9;  Surgeon: Estanislado Pandy, MD;  Location: [facility];  Service: Ophthalmology;  Laterality: Left;  Diabetic   COLONOSCOPY WITH PROPOFOL N/A 06/07/2019   Procedure: COLONOSCOPY WITH PROPOFOL;  Surgeon: Pasty Spillers, MD;  Location: [facility];  Service: Endoscopy;  Laterality: N/A;   COLONOSCOPY WITH PROPOFOL N/A 02/07/2020   Procedure: COLONOSCOPY WITH PROPOFOL;  Surgeon: Pasty Spillers, MD;  Location: [facility];  Service: Endoscopy;  Laterality: N/A;   COLONOSCOPY WITH PROPOFOL N/A 12/11/2020   Procedure: COLONOSCOPY WITH PROPOFOL;  Surgeon: Pasty Spillers, MD;  Location: [facility] END[facility];  Service: Endoscopy;  Laterality: N/A;   COLONOSCOPY WITH PROPOFOL N/A 07/12/2023   Procedure: COLONOSCOPY WITH PROPOFOL;  Surgeon: Wyline Mood, MD;  Location: [facility] END[facility]COPY;  Service: Gastroenterology;  Laterality: N/A;   CORONARY ANGIOPLASTY     CORONARY ARTERY BYPASS GRAFT     x 3   01/2018   ESOPHAGOGASTRODUODENOSCOPY N/A 02/07/2020   Procedure: ESOPHAGOGASTRODUODENOSCOPY (EGD);  Surgeon: Pasty Spillers, MD;  Location: [facility] END[facility]COPY;  Service: Endoscopy;  Laterality: N/A;   HEMOSTASIS CLIP PLACEMENT  07/12/2023   Procedure: HEMOSTASIS CLIP PLACEMENT;  Surgeon: Wyline Mood, MD;  Location: [facility] ENDOSCOPY;  Service: Gastroenterology;;   INCISION AND DRAINAGE Left 06/05/2022   Procedure: INCISION AND DRAINAGE;  Surgeon: Rosetta Posner, DPM;  Location: [facility];  Service: Podiatry;  Laterality: Left;   LEFT HEART CATH AND CORONARY ANGIOGRAPHY N/A 01/24/2018   Procedure: LEFT HEART CATH AND CORONARY ANGIOGRAPHY;  Surgeon: Iran Ouch,  MD;  Location: [facility] INV[facility] LAB;  Service: Cardiovascular;  Laterality: N/A;   LOWER EXTREMITY ANGIOGRAPHY Left 06/08/2022   Procedure: Lower Extremity Angiography;  Surgeon: Annice Needy, MD;  Location: [facility];  Service: Cardiovascular;  Laterality: Left;   LOWER EXTREMITY INTERVENTION Right 03/24/2023   Procedure: LOWER EXTREMITY INTERVENTION;  Surgeon: Annice Needy, MD;  Location: [facility];  Service: Cardiovascular;  Laterality: Right;   POLYPECTOMY  07/12/2023   Procedure: POLYPECTOMY;  Surgeon: Wyline Mood, MD;  Location[facility];  Service: Gastroenterology;;   SVT ABLATION N/A 09/12/2021   Procedure: SVT ABLATION;  Surgeon: Lanier Prude, MD;  Location[facility] INVASIVE CV LAB;  Service: Cardiovascular;  Laterality: N/A;   TONSILLECTOMY     TRANSMETATARSAL AMPUTATION Left 08/04/2022   Procedure: TRANSMETATARSAL AMPUTATION;  Surgeon: Linus Galas, DPM;  Location: [facility];  Service: Podiatry;  Laterality: Left;     Social History   Tobacco Use   Smoking status: Never   Smokeless tobacco: Never  Vaping Use   Vaping status: Never Used  Substance Use Topics   Alcohol use: Yes    Comment: rare beer   Drug use: No       Family History  Problem Relation Age of Onset   Cancer Mother        died @ 2   CAD Father        First MI @ 94. S/p heart transplant. Died in mid-50's of cancer.   Cancer Father    Other Brother        alive and well     Allergies  Allergen Reactions   Lipitor [Atorvastatin] Palpitations    SVT   Sacubitril-Valsartan Other (See Comments)    SVT   Pregabalin Palpitations    SVT     REVIEW OF SYSTEMS (Negative unless checked)  Constitutional: [] Weight loss  [] Fever  [] Chills Cardiac: [] Chest pain   [] Chest pressure   [x] Palpitations   [] Shortness of breath when laying flat   [] Shortness of breath at rest   [] Shortness of breath with exertion. Vascular:  [] Pain in legs with walking   [] Pain in legs at rest   [] Pain  in legs when laying flat   [] Claudication   [] Pain in feet when walking  [] Pain in feet at rest  [] Pain in feet when laying flat   [] History of DVT   [] Phlebitis   [] Swelling in legs   [] Varicose veins   [] Non-healing ulcers Pulmonary:   [] Uses home oxygen   [] Productive cough   [] Hemoptysis   [] Wheeze  [] COPD   [] Asthma Neurologic:  [] Dizziness  [] Blackouts   [] Seizures   [] History of stroke   [] History of TIA  [] Aphasia   [] Temporary blindness   [] Dysphagia   [] Weakness or numbness in arms   [] Weakness or numbness in legs Musculoskeletal:  [] Arthritis   [] Joint swelling   [x] Joint pain   [] Low back pain Hematologic:  [] Easy bruising  [] Easy bleeding   [] Hypercoagulable state   [] Anemic   Gastrointestinal:  [] Blood in stool   [] Vomiting blood  [] Gastroesophageal reflux/heartburn   [] Abdominal pain Genitourinary:  [x] Chronic kidney disease   [] Difficult urination  [] Frequent urination  [] Burning with urination   [] Hematuria Skin:  [] Rashes   [x] Ulcers   [x] Wounds Psychological:  [] History of anxiety   []  History of major depression.  Physical Examination  BP 105/69   Pulse 76   Resp 16   Wt 211 lb 3.2 oz (95.8 kg)   BMI 32.11 kg/m  Gen:  WD/WN, NAD Head: [place]/AT, No temporalis wasting. Ear/Nose/Throat: Hearing grossly intact, nares w/o erythema or drainage Eyes: Conjunctiva clear. Sclera non-icteric Neck: Supple.  Trachea midline Pulmonary:  Good air movement, no use of accessory muscles.  Cardiac: irregular Vascular:  Vessel Right Left  Radial Palpable Palpable                          PT Palpable Palpable  DP Palpable Palpable   Gastrointestinal: soft, non-tender/non-distended. No guarding/reflex.  Musculoskeletal: M/S 5/5 throughout.  No deformity or atrophy. Left foot dressed and in a waling post op shoe. Trace LLE edema. Neurologic: Sensation grossly intact in  extremities.  Symmetrical.  Speech is fluent.  Psychiatric: Judgment intact, Mood & affect appropriate for pt's  clinical situation. Dermatologic: No rashes or ulcers noted.  No cellulitis or open wounds.      Labs Recent Results (from the past 2160 hours)  T4, free     Status: None   Collection Time: 05/05/23  2:02 PM  Result Value Ref Range   Free T4 1.42 0.82 - 1.77 ng/dL  TSH     Status: None   Collection Time: 05/05/23  2:02 PM  Result Value Ref Range   TSH 2.520 0.450 - 4.500 uIU/mL  Surgical pathology     Status: None   Collection Time: 07/12/23 12:00 AM  Result Value Ref Range   SURGICAL PATHOLOGY      SURGICAL PATHOLOGY [facility] [address] [place], [place] [address] Telephone [phone] or [phone] Fax [phone]  REPORT OF SURGICAL PATHOLOGY   Accession #: [id number] Patient Name: BHAVESH, CHURCH Visit # : [id number]  MRN: [id number] Physician: Wyline Mood DOB/Age [birth year] (Age: 1) Gender: M Collected Date: 07/12/2023 Received Date: 07/12/2023  FINAL DIAGNOSIS       1. Transverse Colon Polyp, Cold snare :       - TUBULAR ADENOMA, ONE FRAGMENT.  NEGATIVE FOR HIGH GRADE DYSPLASIA OR      MALIGNANCY.       DATE SIGNED OUT: 07/13/2023 ELECTRONIC SIGNATURE : [place] Md, Mark, Pathologist, Electronic Signature  MICROSCOPIC DESCRIPTION  CASE COMMENTS STAINS USED IN DIAGNOSIS: H&E    CLINICAL HISTORY  SPECIMEN(S) OBTAINED 1. Transverse Colon Polyp, Cold Snare  SPECIMEN COMMENTS: SPECIMEN CLINICAL INFORMATION: 1. Personal history of colon polyps.  Colon polyp    Gross Description 1. Recei ved in formalin labeled with the patient's name and "Transverse colon" is a 0.2 cm piece of tan soft tissue, submitted in toto in a single cassette. (LEF, 07/12/2023)        Report signed out from the following location(s) [place] [address] [place], [place] [address] CLIA #: [id number]  [facility] [address] [place], [place] [address] CLIA #: [id number]    Glucose, capillary     Status: None   Collection Time: 07/12/23  9:45 AM  Result Value Ref Range   Glucose-Capillary 87 70 - 99 mg/dL    Comment: Glucose reference range applies only to samples taken after fasting for at least 8 hours.    Radiology No results found.  Assessment/Plan  Atherosclerosis of native arteries of the extremities with ulceration (HCC) His ABIs today are 1.12 on the right and 1.14 on the left with multiphasic waveforms.  Perfusion is currently good and adequate for wound healing.  Until his wound is healed, we will keep him on short interval follow-ups of 3 months.  Continue current medical regimen which includes Crestor, aspirin, and Plavix.  Hypertension associated with diabetes (HCC) blood pressure control important in reducing the progression of atherosclerotic disease. On appropriate oral medications.   Type 2 diabetes mellitus with diabetic neuropathy, without long-term current use of insulin (HCC) blood glucose control important in reducing the progression of atherosclerotic disease. Also, involved in wound healing. On appropriate medications.    Festus Barren, MD  07/30/2023 2:37 PM    This note was created with Dragon medical transcription system.  Any errors from dictation are purely unintentional

## 2023-07-30 NOTE — Assessment & Plan Note (Signed)
His ABIs today are 1.12 on the right and 1.14 on the left with multiphasic waveforms.  Perfusion is currently good and adequate for wound healing.  Until his wound is healed, we will keep him on short interval follow-ups of 3 months.  Continue current medical regimen which includes Crestor, aspirin, and Plavix.

## 2023-08-01 ENCOUNTER — Other Ambulatory Visit: Payer: Self-pay | Admitting: Cardiology

## 2023-08-02 LAB — VAS US ABI WITH/WO TBI
Left ABI: 1.14
Right ABI: 1.12

## 2023-08-09 ENCOUNTER — Telehealth: Payer: Self-pay | Admitting: Family Medicine

## 2023-08-09 DIAGNOSIS — E1140 Type 2 diabetes mellitus with diabetic neuropathy, unspecified: Secondary | ICD-10-CM

## 2023-08-09 DIAGNOSIS — N1832 Chronic kidney disease, stage 3b: Secondary | ICD-10-CM

## 2023-08-09 DIAGNOSIS — E11628 Type 2 diabetes mellitus with other skin complications: Secondary | ICD-10-CM

## 2023-08-09 DIAGNOSIS — E11319 Type 2 diabetes mellitus with unspecified diabetic retinopathy without macular edema: Secondary | ICD-10-CM

## 2023-08-09 MED ORDER — FREESTYLE LIBRE 3 SENSOR MISC: 1.0000 | 11 refills | Status: AC

## 2023-08-09 NOTE — Telephone Encounter (Signed)
 Pt stated that his pharmacy informed him that he needs to contact pcp to have the primary insurance coverage documented as Medicare so the Rx for [place] 3 sensor can be covered... I informed patient that his Medicare was his primary insurance. Patient stated that he was getting his [place] 3 sensor for free through [employer], but now he's not getting it free. He wants to know if the PCP can help in any way to get the [place] 3 sensor for free. Follow up with patient please. Thank you

## 2023-08-10 NOTE — Telephone Encounter (Signed)
 Patient's wife's insurance has been primary up until this point and now his Medicare is primary.  He is followed by endocrine for his diabetes and was advised that he should get her to prescribe his diabetic supplies.  He is going to call her office and get it sent in

## 2023-08-13 ENCOUNTER — Other Ambulatory Visit: Payer: Self-pay | Admitting: Family Medicine

## 2023-08-13 DIAGNOSIS — I25810 Atherosclerosis of coronary artery bypass graft(s) without angina pectoris: Secondary | ICD-10-CM

## 2023-08-13 NOTE — Telephone Encounter (Signed)
 Medication Refill -  Most Recent Primary Care Visit:  Provider: PAYNE, ELISE T  Department: [facility]  Visit Type: OFFICE VISIT  Date: 07/20/2023  Medication: metoprolol  succinate (TOPROL -XL) 50 MG 24 hr tablet [   Has the patient contacted their pharmacy? Yes   Is this the correct pharmacy for this prescription? Yes  [facility] [place], [place] - [address] [place] [address] Phone: [phone] Fax: [phone]    Has the prescription been filled recently? Yes  Is the patient out of the medication? Yes  Has the patient been seen for an appointment in the last year OR does the patient have an upcoming appointment? Yes  Can we respond through MyChart? Yes  Agent: Please be advised that Rx refills may take up to 3 business days. We ask that you follow-up with your pharmacy.

## 2023-08-16 NOTE — Telephone Encounter (Signed)
 Requested medication (s) are due for refill today: Yes  Requested medication (s) are on the active medication list: Yes  Last refill:  06/29/22  Future visit scheduled:   Notes to clinic:  Prescription has expired.    Requested Prescriptions  Pending Prescriptions Disp Refills   metoprolol  succinate (TOPROL -XL) 50 MG 24 hr tablet 90 tablet 0    Sig: Take 1 tablet (50 mg total) by mouth daily. TAKE WITH OR IMMEDIATELY FOLLOWING A MEAL.     Cardiovascular:  Beta Blockers Passed - 08/16/2023  3:24 PM      Passed - Last BP in normal range    BP Readings from Last 1 Encounters:  07/30/23 105/69         Passed - Last Heart Rate in normal range    Pulse Readings from Last 1 Encounters:  07/30/23 76         Passed - Valid encounter within last 6 months    Recent Outpatient Visits           3 weeks ago Acquired absence of other left toe(s) [facility])   [facility] Emilio Kelly DASEN, FNP   1 month ago No-show for appointment   [facility] Emilio Kelly DASEN, FNP   1 month ago No-show for appointment   [facility] Emilio Kelly T, FNP   4 months ago Diabetic ulcer of toe of right foot associated with type 2 diabetes mellitus, limited to breakdown of skin [facility])   [facility] Emilio Kelly DASEN, FNP   6 months ago [facility] to [employer] preventive visit   [facility] Emilio Kelly DASEN, FNP       Future Appointments             In 5 months Pardue, Lauraine SAILOR, DO [facility], [facility]

## 2023-08-17 ENCOUNTER — Telehealth: Payer: Self-pay

## 2023-08-17 MED ORDER — METOPROLOL SUCCINATE ER 50 MG PO TB24: 50.0000 mg | ORAL_TABLET | Freq: Every day | ORAL | 0 refills | Status: DC

## 2023-08-17 NOTE — Telephone Encounter (Signed)
 Copied from CRM [id number]. Topic: General - Inquiry >> Aug 17, 2023  8:53 AM Haroldine Laws wrote: Reason for CRM: Pt is still waiting for the metoprolol.  It was sent in on Friday.

## 2023-09-01 ENCOUNTER — Other Ambulatory Visit: Payer: Self-pay

## 2023-09-01 MED ORDER — ROSUVASTATIN CALCIUM 20 MG PO TABS: 20.0000 mg | ORAL_TABLET | Freq: Every day | ORAL | 3 refills | Status: AC

## 2023-09-05 ENCOUNTER — Other Ambulatory Visit: Payer: Self-pay | Admitting: Internal Medicine

## 2023-09-07 ENCOUNTER — Ambulatory Visit: Payer: Medicare Other | Admitting: Registered Nurse

## 2023-09-07 ENCOUNTER — Encounter: Payer: Self-pay | Admitting: Gastroenterology

## 2023-09-07 ENCOUNTER — Encounter
Admission: RE | Disposition: A | Discharge status: Home / Self Care | Payer: Self-pay | Source: Home / Self Care | Attending: Gastroenterology

## 2023-09-07 ENCOUNTER — Ambulatory Visit
Admission: RE | Admit: 2023-09-07 | Discharge: 2023-09-07 | Disposition: A | Discharge status: Home / Self Care | Payer: Medicare Other | Attending: Gastroenterology | Admitting: Gastroenterology

## 2023-09-07 DIAGNOSIS — Z951 Presence of aortocoronary bypass graft: Secondary | ICD-10-CM | POA: Insufficient documentation

## 2023-09-07 DIAGNOSIS — Z7984 Long term (current) use of oral hypoglycemic drugs: Secondary | ICD-10-CM | POA: Diagnosis not present

## 2023-09-07 DIAGNOSIS — D122 Benign neoplasm of ascending colon: Secondary | ICD-10-CM | POA: Diagnosis not present

## 2023-09-07 DIAGNOSIS — E1122 Type 2 diabetes mellitus with diabetic chronic kidney disease: Secondary | ICD-10-CM | POA: Diagnosis not present

## 2023-09-07 DIAGNOSIS — I5042 Chronic combined systolic (congestive) and diastolic (congestive) heart failure: Secondary | ICD-10-CM | POA: Insufficient documentation

## 2023-09-07 DIAGNOSIS — N183 Chronic kidney disease, stage 3 unspecified: Secondary | ICD-10-CM | POA: Insufficient documentation

## 2023-09-07 DIAGNOSIS — Z7985 Long-term (current) use of injectable non-insulin antidiabetic drugs: Secondary | ICD-10-CM | POA: Diagnosis not present

## 2023-09-07 DIAGNOSIS — G4730 Sleep apnea, unspecified: Secondary | ICD-10-CM | POA: Insufficient documentation

## 2023-09-07 DIAGNOSIS — Z952 Presence of prosthetic heart valve: Secondary | ICD-10-CM | POA: Diagnosis not present

## 2023-09-07 DIAGNOSIS — Z1211 Encounter for screening for malignant neoplasm of colon: Secondary | ICD-10-CM | POA: Diagnosis present

## 2023-09-07 DIAGNOSIS — I2510 Atherosclerotic heart disease of native coronary artery without angina pectoris: Secondary | ICD-10-CM | POA: Diagnosis not present

## 2023-09-07 DIAGNOSIS — Z794 Long term (current) use of insulin: Secondary | ICD-10-CM | POA: Insufficient documentation

## 2023-09-07 DIAGNOSIS — E1140 Type 2 diabetes mellitus with diabetic neuropathy, unspecified: Secondary | ICD-10-CM | POA: Diagnosis not present

## 2023-09-07 DIAGNOSIS — I130 Hypertensive heart and chronic kidney disease with heart failure and stage 1 through stage 4 chronic kidney disease, or unspecified chronic kidney disease: Secondary | ICD-10-CM | POA: Insufficient documentation

## 2023-09-07 DIAGNOSIS — I252 Old myocardial infarction: Secondary | ICD-10-CM | POA: Insufficient documentation

## 2023-09-07 DIAGNOSIS — Z7902 Long term (current) use of antithrombotics/antiplatelets: Secondary | ICD-10-CM | POA: Diagnosis not present

## 2023-09-07 DIAGNOSIS — Z860100 Personal history of colon polyps, unspecified: Secondary | ICD-10-CM

## 2023-09-07 HISTORY — PX: POLYPECTOMY: SHX5525

## 2023-09-07 HISTORY — PX: COLONOSCOPY WITH PROPOFOL: SHX5780

## 2023-09-07 LAB — GLUCOSE, CAPILLARY: Glucose-Capillary: 90 mg/dL (ref 70–99)

## 2023-09-07 SURGERY — COLONOSCOPY WITH PROPOFOL
Anesthesia: General

## 2023-09-07 MED ORDER — PROPOFOL 1000 MG/100ML IV EMUL
INTRAVENOUS | Status: AC
  Filled 2023-09-07: qty 300

## 2023-09-07 MED ORDER — PROPOFOL 10 MG/ML IV BOLUS
INTRAVENOUS | Status: AC
  Filled 2023-09-07: qty 20

## 2023-09-07 MED ORDER — PROPOFOL 10 MG/ML IV BOLUS
INTRAVENOUS | Status: DC | PRN
  Administered 2023-09-07: 70 mg via INTRAVENOUS

## 2023-09-07 MED ORDER — SODIUM CHLORIDE 0.9 % IV SOLN: INTRAVENOUS | Status: DC

## 2023-09-07 MED ORDER — EPHEDRINE SULFATE (PRESSORS) 50 MG/ML IJ SOLN
INTRAMUSCULAR | Status: DC | PRN
  Administered 2023-09-07: 5 mg via INTRAVENOUS
  Administered 2023-09-07: 10 mg via INTRAVENOUS
  Administered 2023-09-07 (×2): 5 mg via INTRAVENOUS

## 2023-09-07 MED ORDER — PROPOFOL 500 MG/50ML IV EMUL
INTRAVENOUS | Status: DC | PRN
  Administered 2023-09-07: 140 ug/kg/min via INTRAVENOUS

## 2023-09-07 MED ORDER — EPHEDRINE 5 MG/ML INJ
INTRAVENOUS | Status: AC
  Filled 2023-09-07: qty 5

## 2023-09-07 NOTE — Transfer of Care (Signed)
 Immediate Anesthesia Transfer of Care Note  Patient: Ricardo Brown  Procedure(s) Performed: COLONOSCOPY WITH PROPOFOL  POLYPECTOMY  Patient Location: PACU  Anesthesia Type:General  Level of Consciousness: awake, alert , and oriented  Airway & Oxygen Therapy: Patient Spontanous Breathing  Post-op Assessment: Report given to RN and Post -op Vital signs reviewed and stable  Post vital signs: Reviewed and stable  Last Vitals:  Vitals Value Taken Time  BP 104/47 09/07/23 0809  Temp    Pulse 63 09/07/23 0809  Resp    SpO2 97 % 09/07/23 0809    Last Pain:  Vitals:   09/07/23 0703  TempSrc: Temporal         Complications: No notable events documented.

## 2023-09-07 NOTE — Anesthesia Preprocedure Evaluation (Signed)
Anesthesia Evaluation  Patient identified by MRN, date of birth, ID band Patient awake    Reviewed: Allergy & Precautions, NPO status , Patient's Chart, lab work & pertinent test results  History of Anesthesia Complications Negative for: history of anesthetic complications  Airway Mallampati: III  TM Distance: >3 FB Neck ROM: full    Dental  (+) Edentulous Upper, Edentulous Lower   Pulmonary sleep apnea    Pulmonary exam normal        Cardiovascular hypertension, On Medications + CAD, + Past MI, + CABG, + Peripheral Vascular Disease and +CHF  + dysrhythmias Supra Ventricular Tachycardia + Valvular Problems/Murmurs (s/p repair) MR   Echo 09/2022 IMPRESSIONS     1. Left ventricular ejection fraction, by estimation, is 45 to 50%. The  left ventricle has mildly decreased function. The left ventricle  demonstrates global hypokinesis. Left ventricular diastolic parameters are  indeterminate.   2. Right ventricular systolic function is normal. The right ventricular  size is normal.   3. Left atrial size was mild to moderately dilated.   4. The mitral valve has been repaired/replaced. No evidence of mitral  valve regurgitation. The mean mitral valve gradient is 4.0 mmHg. There is  a prosthetic annuloplasty ring present in the mitral position. Procedure  Date: 01/2018.   5. The aortic valve was not well visualized. Aortic valve regurgitation  is not visualized. Aortic valve sclerosis is present, with no evidence of  aortic valve stenosis.   6. The inferior vena cava is normal in size with greater than 50%  respiratory variability, suggesting right atrial pressure of 3 mmHg.     Neuro/Psych negative neurological ROS  negative psych ROS   GI/Hepatic negative GI ROS, Neg liver ROS,,,  Endo/Other  negative endocrine ROSdiabetes    Renal/GU Renal disease  negative genitourinary   Musculoskeletal   Abdominal   Peds   Hematology negative hematology ROS (+)   Anesthesia Other Findings Past Medical History: No date: Chronic combined systolic (congestive) and diastolic  (congestive) heart failure (HCC)     Comment:  a. 01/2018 EF 35-40%; b. 10/2020 Echo: EF 40-45%, glob HK.              RVSP 42.15mmHg. Mildly dil LA. Mild MS/AoV sclerosis. No date: CKD (chronic kidney disease), stage III (HCC) No date: Coronary artery disease     Comment:  a. 01/2018 late presenting inferior MI; b. 01/2018 Cath:               Severe multivessel dzs-->CABG x 3 (LIMA->LAD, VG->OM2,               VG->RPDA @ [facility]). No date: Diabetes mellitus without complication (HCC)     Comment:  a. Dx ~ 2000. No date: Heart palpitations     Comment:  a. Pt reports prior nl echo's and stress tests. Last               stress test w/in past 2 yrs - PCP. No date: High cholesterol No date: Hypertension No date: Mitral regurgitation     Comment:  a. 01/2018 s/p MV repair @ time of CABG. No date: Myocardial infarction [facility]) No date: Neuropathy     Comment:  legs and hands No date: Postoperative atrial fibrillation     Comment:  a. 01/2018 @ time of CABG. No date: PSVT (paroxysmal supraventricular tachycardia) (HCC)     Comment:  a. Very symptomatic with multiple ED evaluations.  Has  been on amio. 12/19/2021: Severe sepsis with acute organ dysfunction (HCC) No date: Sleep apnea     Comment:  "mild - has CPAP, doesn't use 09/07/2019: Sleep difficulties 01/24/2018: STEMI (ST elevation myocardial infarction) (HCC) No date: Wears dentures     Comment:  full upper and lower  Past Surgical History: 08/29/2021: AMPUTATION TOE; Right     Comment:  Procedure: AMPUTATION TOE-2nd toe;  Surgeon: Linus Galas, DPM;  Location: [facility] ORS;  Service: Podiatry;                Laterality: Right; 12/22/2021: AMPUTATION TOE; Left     Comment:  Procedure: AMPUTATION TOE-2nd Toe Partial;  Surgeon:               Rosetta Posner, DPM;   Location: [facility] OR[facility];  Service:               Podiatry;  Laterality: Left; No date: CARDIAC CATHETERIZATION No date: CARDIAC VALVE REPLACEMENT     Comment:  Mitral Valve Repair 10/08/2022: CATARACT EXTRACTION W/PHACO; Left     Comment:  Procedure: CATARACT EXTRACTION PHACO AND INTRAOCULAR               LENS PLACEMENT (IOC) LEFT DIABETIC OMIDRIA 20.94 01:35.9;              Surgeon: Estanislado Pandy, MD;  Location: [facility]               SURGERY CNTR;  Service: Ophthalmology;  Laterality: Left;              Diabetic 06/07/2019: COLONOSCOPY WITH PROPOFOL; N/A     Comment:  Procedure: COLONOSCOPY WITH PROPOFOL;  Surgeon:               Pasty Spillers, MD;  Location: [facility] ENDOSCOPY;                Service: Endoscopy;  Laterality: N/A; 02/07/2020: COLONOSCOPY WITH PROPOFOL; N/A     Comment:  Procedure: COLONOSCOPY WITH PROPOFOL;  Surgeon:               Pasty Spillers, MD;  Location: [facility] ENDOSCOPY;                Service: Endoscopy;  Laterality: N/A; 12/11/2020: COLONOSCOPY WITH PROPOFOL; N/A     Comment:  Procedure: COLONOSCOPY WITH PROPOFOL;  Surgeon:               Pasty Spillers, MD;  Location: [facility] ENDOSCOPY;                Service: Endoscopy;  Laterality: N/A; No date: CORONARY ANGIOPLASTY No date: CORONARY ARTERY BYPASS GRAFT     Comment:  x 3   01/2018 02/07/2020: ESOPHAGOGASTRODUODENOSCOPY; N/A     Comment:  Procedure: ESOPHAGOGASTRODUODENOSCOPY (EGD);  Surgeon:               Pasty Spillers, MD;  Location: [facility] ENDOSCOPY;                Service: Endoscopy;  Laterality: N/A; 06/05/2022: INCISION AND DRAINAGE; Left     Comment:  Procedure: INCISION AND DRAINAGE;  Surgeon: Rosetta Posner, DPM;  Location: [facility] ORS;  Service: Podiatry;                Laterality: Left; 01/24/2018: LEFT HEART  CATH AND CORONARY ANGIOGRAPHY; N/A     Comment:  Procedure: LEFT HEART CATH AND CORONARY ANGIOGRAPHY;                Surgeon: Iran Ouch, MD;  Location: [facility]  INVASIVE               CV LAB;  Service: Cardiovascular;  Laterality: N/A; 06/08/2022: LOWER EXTREMITY ANGIOGRAPHY; Left     Comment:  Procedure: Lower Extremity Angiography;  Surgeon: Annice Needy, MD;  Location: [facility] INVASIVE CV LAB;  Service:               Cardiovascular;  Laterality: Left; 03/24/2023: LOWER EXTREMITY INTERVENTION; Right     Comment:  Procedure: LOWER EXTREMITY INTERVENTION;  Surgeon: Annice Needy, MD;  Location: [facility] INVASIVE CV LAB;  Service:               Cardiovascular;  Laterality: Right; 09/12/2021: SVT ABLATION; N/A     Comment:  Procedure: SVT ABLATION;  Surgeon: Lanier Prude,               MD;  [facility]: [facility] INVASIVE CV LAB;  Service:               Cardiovascular;  Laterality: N/A; No date: TONSILLECTOMY 08/04/2022: TRANSMETATARSAL AMPUTATION; Left     Comment:  Procedure: TRANSMETATARSAL AMPUTATION;  Surgeon: Linus Galas, DPM;  Location: [facility] OR[facility];  Service: Podiatry;                Laterality: Left;  BMI    Body Mass Index: 32.69 kg/m      Reproductive/Obstetrics negative OB ROS                              Anesthesia Physical Anesthesia Plan  ASA: 3  Anesthesia Plan: General   Post-op Pain Management: Minimal or no pain anticipated   Induction: Intravenous  PONV Risk Score and Plan: 2 and Propofol infusion and TIVA  Airway Management Planned: Natural Airway and Nasal Cannula  Additional Equipment:   Intra-op Plan:   Post-operative Plan:   Informed Consent: I have reviewed the patients History and Physical, chart, labs and discussed the procedure including the risks, benefits and alternatives for the proposed anesthesia with the patient or authorized representative who has indicated his/her understanding and acceptance.     Dental Advisory Given  Plan Discussed with: Anesthesiologist, CRNA and Surgeon  Anesthesia Plan Comments: (Patient consented for risks of  anesthesia including but not limited to:  - adverse reactions to medications - risk of airway placement if required - damage to eyes, teeth, lips or other oral mucosa - nerve damage due to positioning  - sore throat or hoarseness - Damage to heart, brain, nerves, lungs, other parts of body or loss of life  Patient voiced understanding and assent.)         Anesthesia Quick Evaluation

## 2023-09-07 NOTE — H&P (Signed)
 Ruel Kung, MD [address], [address], [place], [place], [address] [address], [address], [place], [place], [address] Phone: [phone]  Fax: [phone]  Primary Care Physician:  Donzella Lauraine SAILOR, DO   Pre-Procedure History & Physical: HPI:  Ricardo Brown is a 63 y.o. male is here for an colonoscopy.   Past Medical History:  Diagnosis Date   Chronic combined systolic (congestive) and diastolic (congestive) heart failure (HCC)    a. 01/2018 EF 35-40%; b. 10/2020 Echo: EF 40-45%, glob HK. RVSP 42.33mmHg. Mildly dil LA. Mild MS/AoV sclerosis.   CKD (chronic kidney disease), stage Brown (HCC)    Coronary artery disease    a. 01/2018 late presenting inferior MI; b. 01/2018 Cath: Severe multivessel dzs-->CABG x 3 (LIMA->LAD, VG->OM2, VG->RPDA @ [facility]).   Diabetes mellitus without complication (HCC)    a. Dx ~ 2000.   Heart palpitations    a. Pt reports prior nl echo's and stress tests. Last stress test w/in past 2 yrs - PCP.   High cholesterol    Hypertension    Mitral regurgitation    a. 01/2018 s/p MV repair @ time of CABG.   Myocardial infarction [facility])    Neuropathy    legs and hands   Postoperative atrial fibrillation    a. 01/2018 @ time of CABG.   PSVT (paroxysmal supraventricular tachycardia) (HCC)    a. Very symptomatic with multiple ED evaluations.  Has been on amio.   Severe sepsis with acute organ dysfunction (HCC) 12/19/2021   Sleep apnea    mild - has CPAP, doesn't use   Sleep difficulties 09/07/2019   STEMI (ST elevation myocardial infarction) (HCC) 01/24/2018   Wears dentures    full upper and lower    Past Surgical History:  Procedure Laterality Date   AMPUTATION TOE Right 08/29/2021   Procedure: AMPUTATION TOE-2nd toe;  Surgeon: Neill Boas, DPM;  Location: [facility];  Service: Podiatry;  Laterality: Right;   AMPUTATION TOE Left 12/22/2021   Procedure: AMPUTATION TOE-2nd Toe Partial;  Surgeon: Lennie Barter, DPM;  Location: [facility];  Service: Podiatry;   Laterality: Left;   CARDIAC CATHETERIZATION     CARDIAC VALVE REPLACEMENT     Mitral Valve Repair   CATARACT EXTRACTION W/PHACO Left 10/08/2022   Procedure: CATARACT EXTRACTION PHACO AND INTRAOCULAR LENS PLACEMENT (IOC) LEFT DIABETIC OMIDRIA 20.94 01:35.9;  Surgeon: Enola Feliciano Hugger, MD;  Location: [facility] SURGERY CNTR;  Service: Ophthalmology;  Laterality: Left;  Diabetic   COLONOSCOPY WITH PROPOFOL  N/A 06/07/2019   Procedure: COLONOSCOPY WITH PROPOFOL ;  Surgeon: Janalyn Keene NOVAK, MD;  Location: [facility] ENDOSCOPY;  Service: Endoscopy;  Laterality: N/A;   COLONOSCOPY WITH PROPOFOL  N/A 02/07/2020   Procedure: COLONOSCOPY WITH PROPOFOL ;  Surgeon: Janalyn Keene NOVAK, MD;  Location: [facility] END[facility];  Service: Endoscopy;  Laterality: N/A;   COLONOSCOPY WITH PROPOFOL  N/A 12/11/2020   Procedure: COLONOSCOPY WITH PROPOFOL ;  Surgeon: Janalyn Keene NOVAK, MD;  Location: [facility] ENDOSCOPY;  Service: Endoscopy;  Laterality: N/A;   COLONOSCOPY WITH PROPOFOL  N/A 07/12/2023   Procedure: COLONOSCOPY WITH PROPOFOL ;  Surgeon: Kung Ruel, MD;  Location: [facility] ENDOSCOPY;  Service: Gastroenterology;  Laterality: N/A;   CORONARY ANGIOPLASTY     CORONARY ARTERY BYPASS GRAFT     x 3   01/2018   ESOPHAGOGASTRODUODENOSCOPY N/A 02/07/2020   Procedure: ESOPHAGOGASTRODUODENOSCOPY (EGD);  Surgeon: Janalyn Keene NOVAK, MD;  Location: [facility] ENDOSCOPY;  Service: Endoscopy;  Laterality: N/A;   HEMOSTASIS CLIP PLACEMENT  07/12/2023   Procedure: HEMOSTASIS CLIP PLACEMENT;  Surgeon: Therisa Bi, MD;  Location: [facility] ENDOSCOPY;  Service: Gastroenterology;;   INCISION AND DRAINAGE Left 06/05/2022   Procedure: INCISION AND DRAINAGE;  Surgeon: Lennie Barter, DPM;  Location: [facility];  Service: Podiatry;  Laterality: Left;   LEFT HEART CATH AND CORONARY ANGIOGRAPHY N/A 01/24/2018   Procedure: LEFT HEART CATH AND CORONARY ANGIOGRAPHY;  Surgeon: Darron Deatrice LABOR, MD;  Location: [facility] INVASIVE CV LAB;  Service: Cardiovascular;  Laterality: N/A;    LOWER EXTREMITY ANGIOGRAPHY Left 06/08/2022   Procedure: Lower Extremity Angiography;  Surgeon: Marea Selinda RAMAN, MD;  Location: [facility] INVASIVE CV LAB;  Service: Cardiovascular;  Laterality: Left;   LOWER EXTREMITY INTERVENTION Right 03/24/2023   Procedure: LOWER EXTREMITY INTERVENTION;  Surgeon: Marea Selinda RAMAN, MD;  Location: [facility] INVASIVE CV LAB;  Service: Cardiovascular;  Laterality: Right;   POLYPECTOMY  07/12/2023   Procedure: POLYPECTOMY;  Surgeon: Therisa Bi, MD;  Location: [facility] ENDOSCOPY;  Service: Gastroenterology;;   SVT ABLATION N/A 09/12/2021   Procedure: SVT ABLATION;  Surgeon: Cindie Ole DASEN, MD;  Location: [facility] INVASIVE CV LAB;  Service: Cardiovascular;  Laterality: N/A;   TONSILLECTOMY     TRANSMETATARSAL AMPUTATION Left 08/04/2022   Procedure: TRANSMETATARSAL AMPUTATION;  Surgeon: Neill Boas, DPM;  Location: [facility] ORS;  Service: Podiatry;  Laterality: Left;    Prior to Admission medications   Medication Sig Start Date End Date Taking? Authorizing Provider  amiodarone  (PACERONE ) 200 MG tablet Take 1 tablet (200 mg total) by mouth daily. 07/15/23  Yes Darliss Rogue, MD  aspirin  81 MG EC tablet Take 81 mg by mouth daily.    Yes [provider]  b complex vitamins tablet Take 1 tablet by mouth daily.    Yes [provider]  gabapentin  (NEURONTIN ) 100 MG capsule Take 1 capsule (100 mg total) by mouth 2 (two) times daily. 02/17/23  Yes Emilio Marseille T, FNP  insulin  degludec (TRESIBA) 200 UNIT/ML FlexTouch Pen 32 Units at bedtime.   Yes [provider]  losartan  (COZAAR ) 25 MG tablet Take 1 tablet (25 mg total) by mouth daily. 07/20/23  Yes Emilio Marseille DASEN, FNP  metFORMIN  (GLUCOPHAGE ) 500 MG tablet Take 2 tablets (1,000 mg total) by mouth 2 (two) times daily. 07/20/23  Yes Emilio Marseille DASEN, FNP  metoprolol  succinate (TOPROL -XL) 50 MG 24 hr tablet Take 1 tablet (50 mg total) by mouth daily. TAKE WITH OR IMMEDIATELY FOLLOWING A MEAL. 08/17/23  Yes Pardue, Lauraine SAILOR, DO   pramipexole  (MIRAPEX ) 1.5 MG tablet Take 1 tablet (1.5 mg total) by mouth daily. 03/24/23  Yes Alexander, Natalie, DO  spironolactone  (ALDACTONE ) 25 MG tablet Take 0.5 tablets (12.5 mg total) by mouth daily. 12/30/22  Yes Rolan Ezra RAMAN, MD  torsemide  (DEMADEX ) 20 MG tablet TAKE 3 TABLETS BY MOUTH IN THE MORNING AND 2 IN THE EVENING 09/06/23  Yes Bensimhon, Toribio SAUNDERS, MD  ascorbic acid  (VITAMIN C ) 500 MG tablet Take 1 tablet (500 mg total) by mouth 2 (two) times daily. 06/09/22   Rai, Nydia POUR, MD  clopidogrel  (PLAVIX ) 75 MG tablet Take 1 tablet by mouth once daily 07/15/23   Brown, Fallon E, NP  Continuous Glucose Sensor (FREESTYLE LIBRE 3 SENSOR) MISC 1 each by Does not apply route every 14 (fourteen) days. Place 1 sensor on the skin every 14 days. Use to check glucose continuously 08/09/23   Donzella Lauraine SAILOR, DO  dapagliflozin  propanediol (FARXIGA ) 10 MG TABS tablet Take 1 tablet (10 mg total) by mouth daily before breakfast. 07/20/23   Emilio Marseille  T, FNP  Dulaglutide (TRULICITY) 1.5 MG/0.5ML SOPN Inject 1.5 mg into the skin once a week.    [provider]  Omega-3 Fatty Acids (FISH OIL) 875 MG CAPS Take 875 mg by mouth 2 (two) times daily.    [provider]  rosuvastatin  (CRESTOR ) 20 MG tablet Take 1 tablet (20 mg total) by mouth daily. 09/01/23 11/30/23  Darliss Rogue, MD    Allergies as of 07/29/2023 - Review Complete 07/20/2023  Allergen Reaction Noted   Lipitor [atorvastatin] Palpitations 04/23/2015   Sacubitril-valsartan Other (See Comments) 06/07/2019   Pregabalin Palpitations 10/02/2019    Family History  Problem Relation Age of Onset   Cancer Mother        died @ 48   CAD Father        First MI @ 90. S/p heart transplant. Died in mid-50's of cancer.   Cancer Father    Other Brother        alive and well    Social History   Socioeconomic History   Marital status: Married    Spouse name: Not on file   Number of children: Not on file   Years of  education: Not on file   Highest education level: 12th grade  Occupational History   Not on file  Tobacco Use   Smoking status: Never   Smokeless tobacco: Never  Vaping Use   Vaping status: Never Used  Substance and Sexual Activity   Alcohol use: Yes    Comment: rare beer   Drug use: No   Sexual activity: Not on file  Other Topics Concern   Not on file  Social History Narrative   Lives locally with wife.  Works in [employer].  Does not routinely exercise.   Social Drivers of Health   Financial Resource Strain: Medium Risk (07/20/2023)   Overall Financial Resource Strain (CARDIA)    Difficulty of Paying Living Expenses: Somewhat hard  Food Insecurity: Food Insecurity Present (07/20/2023)   Hunger Vital Sign    Worried About Running Out of Food in the Last Year: Sometimes true    Ran Out of Food in the Last Year: Sometimes true  Transportation Needs: No Transportation Needs (07/20/2023)   PRAPARE - Administrator, Civil Service (Medical): No    Lack of Transportation (Non-Medical): No  Physical Activity: Insufficiently Active (07/20/2023)   Exercise Vital Sign    Days of Exercise per Week: 3 days    Minutes of Exercise per Session: 30 min  Stress: No Stress Concern Present (07/20/2023)   [employer] of Occupational Health - Occupational Stress Questionnaire    Feeling of Stress : Not at all  Social Connections: Moderately Isolated (07/20/2023)   Social Connection and Isolation Panel [NHANES]    Frequency of Communication with Friends and Family: More than three times a week    Frequency of Social Gatherings with Friends and Family: Three times a week    Attends Religious Services: Never    Active Member of Clubs or Organizations: No    Attends Banker Meetings: Not on file    Marital Status: Married  Catering Manager Violence: Not At Risk (03/22/2023)   Humiliation, Afraid, Rape, and Kick questionnaire    Fear of Current or Ex-Partner: No     Emotionally Abused: No    Physically Abused: No    Sexually Abused: No    Review of Systems: See HPI, otherwise negative ROS  Physical Exam: BP 137/65  Pulse 67   Temp (!) 96.5 F (35.8 C) (Temporal)   Resp 17   Wt 97.5 kg   SpO2 99%   BMI 32.69 kg/m  General:   Alert,  pleasant and cooperative in NAD Head:  Normocephalic and atraumatic. Neck:  Supple; no masses or thyromegaly. Lungs:  Clear throughout to auscultation, normal respiratory effort.    Heart:  +S1, +S2, Regular rate and rhythm, No edema. Abdomen:  Soft, nontender and nondistended. Normal bowel sounds, without guarding, and without rebound.   Neurologic:  Alert and  oriented x4;  grossly normal neurologically.  Impression/Plan: Ricardo Brown is here for an colonoscopy to be performed for surveillance due to prior history of colon polyps   Risks, benefits, limitations, and alternatives regarding  colonoscopy have been reviewed with the patient.  Questions have been answered.  All parties agreeable.   Ruel Kung, MD  09/07/2023, 7:45 AM

## 2023-09-07 NOTE — Op Note (Signed)
 [place] [facility] Gastroenterology Patient Name: Ricardo Brown Procedure Date: 09/07/2023 7:20 AM MRN: [id number] Account #: [id number] Date of Birth: [birth year] Admit Type: Outpatient Age: 63 Room: [facility] ENDO ROOM 2 Gender: Male Note Status: Finalized Instrument Name: Arvis [id number] Procedure:             Colonoscopy Indications:           Surveillance: Personal history of adenomatous polyps                         on last colonoscopy > 3 years ago Providers:             Ruel Kung MD, MD Referring MD:          Lauraine GEANNIE Buoy (Referring MD) Medicines:             Monitored Anesthesia Care Complications:         No immediate complications. Procedure:             Pre-Anesthesia Assessment:                        - Prior to the procedure, a History and Physical was                         performed, and patient medications, allergies and                         sensitivities were reviewed. The patient's tolerance                         of previous anesthesia was reviewed.                        - The risks and benefits of the procedure and the                         sedation options and risks were discussed with the                         patient. All questions were answered and informed                         consent was obtained.                        - ASA Grade Assessment: II - A patient with mild                         systemic disease.                        After obtaining informed consent, the colonoscope was                         passed under direct vision. Throughout the procedure,                         the patient's blood pressure, pulse, and oxygen  saturations were monitored continuously. The                         Colonoscope was introduced through the anus and                         advanced to the the cecum, identified by the                         appendiceal orifice. The colonoscopy was performed                          with ease. The patient tolerated the procedure well.                         The quality of the bowel preparation was adequate. The                         ileocecal valve, appendiceal orifice, and rectum were                         photographed. Findings:      The perianal and digital rectal examinations were normal.      A 5 mm polyp was found in the ascending colon. The polyp was sessile.       The polyp was removed with a cold snare. Resection and retrieval were       complete.      The exam was otherwise without abnormality on direct and retroflexion       views. Impression:            - One 5 mm polyp in the ascending colon, removed with                         a cold snare. Resected and retrieved.                        - The examination was otherwise normal on direct and                         retroflexion views. Recommendation:        - Discharge patient to home (with escort).                        - Resume previous diet.                        - Continue present medications.                        - Await pathology results.                        - Repeat colonoscopy in 5 years for surveillance. Procedure Code(s):     --- Professional ---                        [phone], Colonoscopy, flexible; with removal of  tumor(s), polyp(s), or other lesion(s) by snare                         technique Diagnosis Code(s):     --- Professional ---                        Z86.010, Personal history of colonic polyps                        D12.2, Benign neoplasm of ascending colon CPT copyright 2022 American Medical Association. All rights reserved. The codes documented in this report are preliminary and upon coder review may  be revised to meet current compliance requirements. Ruel Kung, MD Ruel Kung MD, MD 09/07/2023 8:03:11 AM This report has been signed electronically. Number of Addenda: 0 Note Initiated On: 09/07/2023 7:20 AM Scope Withdrawal Time: 0  hours 7 minutes 5 seconds  Total Procedure Duration: 0 hours 12 minutes 5 seconds  Estimated Blood Loss:  Estimated blood loss: none.      [facility]

## 2023-09-07 NOTE — Anesthesia Postprocedure Evaluation (Signed)
 Anesthesia Post Note  Patient: Ricardo Brown  Procedure(s) Performed: COLONOSCOPY WITH PROPOFOL  POLYPECTOMY  Patient location during evaluation: Endoscopy Anesthesia Type: General Level of consciousness: awake and alert Pain management: pain level controlled Vital Signs Assessment: post-procedure vital signs reviewed and stable Respiratory status: spontaneous breathing, nonlabored ventilation, respiratory function stable and patient connected to nasal cannula oxygen Cardiovascular status: blood pressure returned to baseline and stable Postop Assessment: no apparent nausea or vomiting Anesthetic complications: no   No notable events documented.   Last Vitals:  Vitals:   09/07/23 0820 09/07/23 0826  BP:  129/72  Pulse: 70 (!) 111  Resp:    Temp:    SpO2: 100% 99%    Last Pain:  Vitals:   09/07/23 0809  TempSrc: Temporal                 [name]

## 2023-09-08 ENCOUNTER — Encounter: Payer: Self-pay | Admitting: Gastroenterology

## 2023-09-08 LAB — SURGICAL PATHOLOGY

## 2023-09-21 ENCOUNTER — Encounter: Payer: Self-pay | Admitting: Gastroenterology

## 2023-10-10 ENCOUNTER — Other Ambulatory Visit: Payer: Self-pay | Admitting: Internal Medicine

## 2023-10-15 ENCOUNTER — Telehealth: Payer: Self-pay | Admitting: Family Medicine

## 2023-10-15 MED ORDER — LOSARTAN POTASSIUM 25 MG PO TABS: 25.0000 mg | ORAL_TABLET | Freq: Every day | ORAL | 0 refills | Status: DC

## 2023-10-15 NOTE — Telephone Encounter (Signed)
 [facility] is requesting refill losartan (COZAAR) 25 MG tablet   Please advise

## 2023-10-15 NOTE — Telephone Encounter (Signed)
 refilled

## 2023-10-18 ENCOUNTER — Encounter: Payer: Self-pay | Admitting: Family Medicine

## 2023-10-29 ENCOUNTER — Ambulatory Visit (INDEPENDENT_AMBULATORY_CARE_PROVIDER_SITE_OTHER): Payer: Medicare Other | Admitting: Vascular Surgery

## 2023-10-29 ENCOUNTER — Encounter (INDEPENDENT_AMBULATORY_CARE_PROVIDER_SITE_OTHER): Payer: 59

## 2023-11-01 ENCOUNTER — Other Ambulatory Visit (INDEPENDENT_AMBULATORY_CARE_PROVIDER_SITE_OTHER): Payer: Self-pay | Admitting: Nurse Practitioner

## 2023-11-15 ENCOUNTER — Other Ambulatory Visit: Payer: Self-pay | Admitting: Internal Medicine

## 2023-11-16 ENCOUNTER — Telehealth: Payer: Self-pay | Admitting: Cardiology

## 2023-11-16 NOTE — Telephone Encounter (Signed)
*  STAT* If patient is at the pharmacy, call can be transferred to refill team.   1. Which medications need to be refilled? (please list name of each medication and dose if known)   torsemide (DEMADEX) 20 MG tablet    2. Which pharmacy/location (including street and city if local pharmacy) is medication to be sent to? [facility] - [place], [place] - [address]   3. Do they need a 30 day or 90 day supply? 90  

## 2023-11-17 ENCOUNTER — Other Ambulatory Visit: Payer: Self-pay

## 2023-11-17 MED ORDER — TORSEMIDE 20 MG PO TABS: ORAL_TABLET | ORAL | 2 refills | Status: DC

## 2023-11-20 ENCOUNTER — Other Ambulatory Visit: Payer: Self-pay | Admitting: Family Medicine

## 2023-11-20 DIAGNOSIS — I25810 Atherosclerosis of coronary artery bypass graft(s) without angina pectoris: Secondary | ICD-10-CM

## 2023-11-22 ENCOUNTER — Other Ambulatory Visit: Payer: Self-pay

## 2023-11-22 DIAGNOSIS — I25810 Atherosclerosis of coronary artery bypass graft(s) without angina pectoris: Secondary | ICD-10-CM

## 2023-11-22 NOTE — Telephone Encounter (Signed)
 Copied from CRM [id number]. Topic: Clinical - Prescription Issue >> Nov 22, 2023  4:42 PM Felizardo Hotter wrote: Reason for CRM: Pt called regarding metoprolol  succinate (TOPROL -XL) 50 MG 24 hr tablet. Pt is scheduled for appt on 01/20/2024 with Pardue, NP. [facility] [place], [place] - [address] [place] [address] Phone: [phone] Fax: [phone]

## 2023-11-23 MED ORDER — METOPROLOL SUCCINATE ER 50 MG PO TB24: 50.0000 mg | ORAL_TABLET | Freq: Every day | ORAL | 0 refills | Status: DC

## 2023-11-23 NOTE — Addendum Note (Signed)
 Addended by: Estill Hemming on: 11/23/2023 09:57 AM   Modules accepted: Orders

## 2023-11-23 NOTE — Addendum Note (Signed)
 Addended by: Diona Franklin D on: 11/23/2023 09:14 AM   Modules accepted: Orders

## 2023-11-23 NOTE — Telephone Encounter (Signed)
Converted to refill request 

## 2023-11-25 ENCOUNTER — Other Ambulatory Visit: Payer: Self-pay | Admitting: Family Medicine

## 2023-11-25 DIAGNOSIS — I25810 Atherosclerosis of coronary artery bypass graft(s) without angina pectoris: Secondary | ICD-10-CM

## 2023-11-25 MED ORDER — METOPROLOL SUCCINATE ER 50 MG PO TB24: 50.0000 mg | ORAL_TABLET | Freq: Every day | ORAL | 0 refills | Status: DC

## 2023-11-25 NOTE — Telephone Encounter (Signed)
 Copied from CRM [id number]. Topic: Clinical - Prescription Issue >> Nov 22, 2023  4:42 PM Felizardo Hotter wrote: Reason for CRM: Pt called regarding metoprolol  succinate (TOPROL -XL) 50 MG 24 hr tablet. Pt is scheduled for appt on 01/20/2024 with Pardue, NP. [facility] [place], [place] - [address] [place] [address] Phone: [phone] Fax: [phone] >> Nov 25, 2023  9:41 AM Juluis Ok wrote: Patient calling to check the status of med refill. States he has been out of his medication since Sunday, 4/20. Patient requesting to have medication sent to pharmacy as soon as possible. Patient callback # [phone]

## 2023-11-25 NOTE — Telephone Encounter (Signed)
 Pt requesting change of pharmacy  Requested Prescriptions  Pending Prescriptions Disp Refills   metoprolol  succinate (TOPROL -XL) 50 MG 24 hr tablet 90 tablet 0    Sig: Take 1 tablet (50 mg total) by mouth daily. TAKE WITH OR IMMEDIATELY FOLLOWING A MEAL.     Cardiovascular:  Beta Blockers Failed - 11/25/2023 10:34 AM      Failed - Last Heart Rate in normal range    Pulse Readings from Last 1 Encounters:  09/07/23 (!) 111         Failed - Valid encounter within last 6 months    Recent Outpatient Visits   None     Future Appointments             In 1 week Riddle, Suzann, NP [employer] at [place]   In 1 month Pardue, Asencion Blacksmith, DO [facility]            Passed - Last BP in normal range    BP Readings from Last 1 Encounters:  09/07/23 129/72

## 2023-11-30 ENCOUNTER — Other Ambulatory Visit: Payer: Self-pay | Admitting: Family Medicine

## 2023-11-30 DIAGNOSIS — I25810 Atherosclerosis of coronary artery bypass graft(s) without angina pectoris: Secondary | ICD-10-CM

## 2023-12-01 ENCOUNTER — Encounter (INDEPENDENT_AMBULATORY_CARE_PROVIDER_SITE_OTHER): Payer: Self-pay | Admitting: Nurse Practitioner

## 2023-12-01 ENCOUNTER — Ambulatory Visit (INDEPENDENT_AMBULATORY_CARE_PROVIDER_SITE_OTHER): Admitting: Nurse Practitioner

## 2023-12-01 ENCOUNTER — Ambulatory Visit (INDEPENDENT_AMBULATORY_CARE_PROVIDER_SITE_OTHER)

## 2023-12-01 VITALS — BP 128/72 | HR 83 | Resp 18 | Ht 68.0 in | Wt 210.2 lb

## 2023-12-01 DIAGNOSIS — E1159 Type 2 diabetes mellitus with other circulatory complications: Secondary | ICD-10-CM | POA: Diagnosis not present

## 2023-12-01 DIAGNOSIS — E1122 Type 2 diabetes mellitus with diabetic chronic kidney disease: Secondary | ICD-10-CM | POA: Diagnosis not present

## 2023-12-01 DIAGNOSIS — I7025 Atherosclerosis of native arteries of other extremities with ulceration: Secondary | ICD-10-CM | POA: Diagnosis not present

## 2023-12-01 DIAGNOSIS — N1832 Chronic kidney disease, stage 3b: Secondary | ICD-10-CM | POA: Diagnosis not present

## 2023-12-01 DIAGNOSIS — I152 Hypertension secondary to endocrine disorders: Secondary | ICD-10-CM

## 2023-12-01 DIAGNOSIS — Z794 Long term (current) use of insulin: Secondary | ICD-10-CM

## 2023-12-02 LAB — VAS US ABI WITH/WO TBI
Left ABI: 1.03
Right ABI: 1.06

## 2023-12-02 NOTE — Progress Notes (Signed)
 Subjective:    Patient ID: Ricardo Brown, male    DOB: July 27, 1961, 63 y.o.   MRN: [id number] Chief Complaint  Patient presents with   Follow-up    fu 3 months + ABI    The patient is a 63 year old male who presents today for follow-up evaluation of his peripheral arterial disease.  His most recent intervention was in August 2024 which showed angioplasty of the right anterior tibial artery and tibioperoneal trunk.  He still continues to have a wound in place.  He notes that it has improved has become more shallow but because he is not able to stay off his foot is still been slow to heal.  Today the patient has an ABI of 1.06 on the right and 1.03 on the left.  Previous ABIs 1.12 on the right and 1.14 on the left.  He has good triphasic waveforms bilaterally with good toe waveforms bilaterally.    Review of Systems     Objective:   Physical Exam  BP 128/72   Pulse 83   Resp 18   Ht 5\' 8"  (1.727 m)   Wt 210 lb 3.2 oz (95.3 kg)   BMI 31.96 kg/m   Past Medical History:  Diagnosis Date   Chronic combined systolic (congestive) and diastolic (congestive) heart failure (HCC)    a. 01/2018 EF 35-40%; b. 10/2020 Echo: EF 40-45%, glob HK. RVSP 42.4mmHg. Mildly dil LA. Mild MS/AoV sclerosis.   CKD (chronic kidney disease), stage Brown (HCC)    Coronary artery disease    a. 01/2018 late presenting inferior MI; b. 01/2018 Cath: Severe multivessel dzs-->CABG x 3 (LIMA->LAD, VG->OM2, VG->RPDA @ [facility]).   Diabetes mellitus without complication (HCC)    a. Dx ~ 2000.   Heart palpitations    a. Pt reports prior nl echo's and stress tests. Last stress test w/in past 2 yrs - PCP.   High cholesterol    Hypertension    Mitral regurgitation    a. 01/2018 s/p MV repair @ time of CABG.   Myocardial infarction [facility])    Neuropathy    legs and hands   Postoperative atrial fibrillation    a. 01/2018 @ time of CABG.   PSVT (paroxysmal supraventricular tachycardia) (HCC)    a. Very symptomatic  with multiple ED evaluations.  Has been on amio.   Severe sepsis with acute organ dysfunction (HCC) 12/19/2021   Sleep apnea    "mild - has CPAP, doesn't use   Sleep difficulties 09/07/2019   STEMI (ST elevation myocardial infarction) (HCC) 01/24/2018   Wears dentures    full upper and lower    Social History   Socioeconomic History   Marital status: Married    Spouse name: Not on file   Number of children: Not on file   Years of education: Not on file   Highest education level: 12th grade  Occupational History   Not on file  Tobacco Use   Smoking status: Never   Smokeless tobacco: Never  Vaping Use   Vaping status: Never Used  Substance and Sexual Activity   Alcohol use: Yes    Comment: rare beer   Drug use: No   Sexual activity: Not on file  Other Topics Concern   Not on file  Social History Narrative   Lives locally with wife.  Works in [employer].  Does not routinely exercise.   Social Drivers of Health   Financial Resource Strain: Medium Risk (07/20/2023)   Overall Financial  Resource Strain (CARDIA)    Difficulty of Paying Living Expenses: Somewhat hard  Food Insecurity: Food Insecurity Present (07/20/2023)   Hunger Vital Sign    Worried About Running Out of Food in the Last Year: Sometimes true    Ran Out of Food in the Last Year: Sometimes true  Transportation Needs: No Transportation Needs (07/20/2023)   PRAPARE - Administrator, Civil Service (Medical): No    Lack of Transportation (Non-Medical): No  Physical Activity: Insufficiently Active (07/20/2023)   Exercise Vital Sign    Days of Exercise per Week: 3 days    Minutes of Exercise per Session: 30 min  Stress: No Stress Concern Present (07/20/2023)   [employer] of Occupational Health - Occupational Stress Questionnaire    Feeling of Stress : Not at all  Social Connections: Moderately Isolated (07/20/2023)   Social Connection and Isolation Panel [NHANES]    Frequency of  Communication with Friends and Family: More than three times a week    Frequency of Social Gatherings with Friends and Family: Three times a week    Attends Religious Services: Never    Active Member of Clubs or Organizations: No    Attends Banker Meetings: Not on file    Marital Status: Married  Intimate Partner Violence: Not At Risk (03/22/2023)   Humiliation, Afraid, Rape, and Kick questionnaire    Fear of Current or Ex-Partner: No    Emotionally Abused: No    Physically Abused: No    Sexually Abused: No    Past Surgical History:  Procedure Laterality Date   AMPUTATION TOE Right 08/29/2021   Procedure: AMPUTATION TOE-2nd toe;  Surgeon: Angel Barba, DPM;  Location: [facility] OR[facility];  Service: Podiatry;  Laterality: Right;   AMPUTATION TOE Left 12/22/2021   Procedure: AMPUTATION TOE-2nd Toe Partial;  Surgeon: Pink Bridges, DPM;  Location: [facility];  Service: Podiatry;  Laterality: Left;   CARDIAC CATHETERIZATION     CARDIAC VALVE REPLACEMENT     Mitral Valve Repair   CATARACT EXTRACTION W/PHACO Left 10/08/2022   Procedure: CATARACT EXTRACTION PHACO AND INTRAOCULAR LENS PLACEMENT (IOC) LEFT DIABETIC OMIDRIA 20.94 01:35.9;  Surgeon: Ricardo Fus, MD;  Location: [facility];  Service: Ophthalmology;  Laterality: Left;  Diabetic   COLONOSCOPY WITH PROPOFOL  N/A 06/07/2019   Procedure: COLONOSCOPY WITH PROPOFOL ;  Surgeon: Irby Mannan, MD;  Location: [facility] ENDOSCOPY;  Service: Endoscopy;  Laterality: N/A;   COLONOSCOPY WITH PROPOFOL  N/A 02/07/2020   Procedure: COLONOSCOPY WITH PROPOFOL ;  Surgeon: Irby Mannan, MD;  Location: [facility] ENDOSCOPY;  Service: Endoscopy;  Laterality: N/A;   COLONOSCOPY WITH PROPOFOL  N/A 12/11/2020   Procedure: COLONOSCOPY WITH PROPOFOL ;  Surgeon: Irby Mannan, MD;  Location: [facility] ENDOSCOPY;  Service: Endoscopy;  Laterality: N/A;   COLONOSCOPY WITH PROPOFOL  N/A 07/12/2023   Procedure: COLONOSCOPY WITH PROPOFOL ;  Surgeon: Luke Salaam, MD;  Location: [facility] ENDOSCOPY;  Service: Gastroenterology;  Laterality: N/A;   COLONOSCOPY WITH PROPOFOL  N/A 09/07/2023   Procedure: COLONOSCOPY WITH PROPOFOL ;  Surgeon: Luke Salaam, MD;  Location: [facility] ENDOSCOPY;  Service: Gastroenterology;  Laterality: N/A;   CORONARY ANGIOPLASTY     CORONARY ARTERY BYPASS GRAFT     x 3   01/2018   ESOPHAGOGASTRODUODENOSCOPY N/A 02/07/2020   Procedure: ESOPHAGOGASTRODUODENOSCOPY (EGD);  Surgeon: Irby Mannan, MD;  Location: [facility] ENDOSCOPY;  Service: Endoscopy;  Laterality: N/A;   HEMOSTASIS CLIP PLACEMENT  07/12/2023   Procedure: HEMOSTASIS CLIP PLACEMENT;  Surgeon: Luke Salaam, MD;  Location[facility]  ENDOSCOPY;  Service: Gastroenterology;;   INCISION AND DRAINAGE Left 06/05/2022   Procedure: INCISION AND DRAINAGE;  Surgeon: Pink Bridges, DPM;  Location: [facility];  Service: Podiatry;  Laterality: Left;   LEFT HEART CATH AND CORONARY ANGIOGRAPHY N/A 01/24/2018   Procedure: LEFT HEART CATH AND CORONARY ANGIOGRAPHY;  Surgeon: Wenona Hamilton, MD;  Location: [facility] INVASIVE CV LAB;  Service: Cardiovascular;  Laterality: N/A;   LOWER EXTREMITY ANGIOGRAPHY Left 06/08/2022   Procedure: Lower Extremity Angiography;  Surgeon: Celso College, MD;  Location: [facility] INVASIVE CV LAB;  Service: Cardiovascular;  Laterality: Left;   LOWER EXTREMITY INTERVENTION Right 03/24/2023   Procedure: LOWER EXTREMITY INTERVENTION;  Surgeon: Celso College, MD;  Location: [facility] INVASIVE CV LAB;  Service: Cardiovascular;  Laterality: Right;   POLYPECTOMY  07/12/2023   Procedure: POLYPECTOMY;  Surgeon: Luke Salaam, MD;  [facility] ENDOSCOPY;  Service: Gastroenterology;;   POLYPECTOMY  09/07/2023   Procedure: POLYPECTOMY;  Surgeon: Luke Salaam, MD;  Location: [facility] END[facility];  Service: Gastroenterology;;   SVT ABLATION N/A 09/12/2021   Procedure: SVT ABLATION;  Surgeon: Boyce Byes, MD;  Location: [facility] INVASIVE CV LAB;  Service: Cardiovascular;  Laterality: N/A;   TONSILLECTOMY      TRANSMETATARSAL AMPUTATION Left 08/04/2022   Procedure: TRANSMETATARSAL AMPUTATION;  Surgeon: Angel Barba, DPM;  Location: [facility];  Service: Podiatry;  Laterality: Left;    Family History  Problem Relation Age of Onset   Cancer Mother        died @ 41   CAD Father        First MI @ 70. S/p heart transplant. Died in mid-50's of cancer.   Cancer Father    Other Brother        alive and well    Allergies  Allergen Reactions   Lipitor [Atorvastatin] Palpitations    SVT   Sacubitril-Valsartan Other (See Comments)    SVT   Pregabalin Palpitations    SVT       Latest Ref Rng & Units 03/24/2023    4:55 AM 03/23/2023    5:17 AM 03/22/2023   10:42 AM  CBC  WBC 4.0 - 10.5 K/uL 6.7  7.7  10.4   Hemoglobin 13.0 - 17.0 g/dL 19.1  47.8  29.5   Hematocrit 39.0 - 52.0 % 33.7  31.9  38.0   Platelets 150 - 400 K/uL 188  175  207       CMP     Component Value Date/Time   NA 137 03/24/2023 0455   NA 136 08/26/2021 0952   K 3.8 03/24/2023 0455   CL 102 03/24/2023 0455   CO2 29 03/24/2023 0455   GLUCOSE 159 (H) 03/24/2023 0455   BUN 33 (H) 03/24/2023 0455   BUN 69 (H) 08/26/2021 0952   CREATININE 1.51 (H) 03/24/2023 0455   CREATININE 1.31 09/30/2020 1114   CALCIUM  8.3 (L) 03/24/2023 0455   PROT 6.4 (L) 03/23/2023 0517   ALBUMIN 3.3 (L) 03/23/2023 0517   AST 16 03/23/2023 0517   ALT 15 03/23/2023 0517   ALKPHOS 44 03/23/2023 0517   BILITOT 0.8 03/23/2023 0517   EGFR 29 (L) 08/26/2021 0952   GFRNONAA 52 (L) 03/24/2023 0455   GFRNONAA 59 (L) 09/30/2020 1114     No results found.     Assessment & Plan:   1. Atherosclerosis of native arteries of the extremities with ulceration (HCC) (Primary) In reviewing the notes from the patient's podiatrist as well as in discussion with the patient  himself, I suspect that the reason his wounds are slow healing is because of continued pressure from activity.  He notes that he may be getting any bruising which hopefully will help.  Otherwise  he will continue to follow with podiatry for workup and evaluation.  Will continue to maintain close follow-up given the wound but he is advised to follow-up sooner if the wound deteriorates.  2. Type 2 diabetes mellitus with stage 3b chronic kidney disease, with long-term current use of insulin  (HCC) Continue hypoglycemic medications as already ordered, these medications have been reviewed and there are no changes at this time.  Hgb A1C to be monitored as already arranged by primary service  3. Hypertension associated with diabetes (HCC) Continue antihypertensive medications as already ordered, these medications have been reviewed and there are no changes at this time.   Current Outpatient Medications on File Prior to Visit  Medication Sig Dispense Refill   amiodarone  (PACERONE ) 200 MG tablet Take 1 tablet (200 mg total) by mouth daily. 90 tablet 2   ascorbic acid  (VITAMIN C ) 500 MG tablet Take 1 tablet (500 mg total) by mouth 2 (two) times daily. 60 tablet 3   aspirin  81 MG EC tablet Take 81 mg by mouth daily.      b complex vitamins tablet Take 1 tablet by mouth daily.      clopidogrel  (PLAVIX ) 75 MG tablet Take 1 tablet by mouth once daily 90 tablet 0   Continuous Glucose Sensor (FREESTYLE LIBRE 3 SENSOR) MISC 1 each by Does not apply route every 14 (fourteen) days. Place 1 sensor on the skin every 14 days. Use to check glucose continuously 2 each 11   dapagliflozin  propanediol (FARXIGA ) 10 MG TABS tablet Take 1 tablet (10 mg total) by mouth daily before breakfast. 30 tablet 6   Dulaglutide (TRULICITY) 1.5 MG/0.5ML SOPN Inject 1.5 mg into the skin once a week.     gabapentin  (NEURONTIN ) 100 MG capsule Take 1 capsule (100 mg total) by mouth 2 (two) times daily. 200 capsule 3   insulin  degludec (TRESIBA) 200 UNIT/ML FlexTouch Pen 32 Units at bedtime.     losartan  (COZAAR ) 25 MG tablet Take 1 tablet (25 mg total) by mouth daily. 90 tablet 0   metFORMIN  (GLUCOPHAGE ) 500 MG tablet Take 2  tablets (1,000 mg total) by mouth 2 (two) times daily.     metoprolol  succinate (TOPROL -XL) 50 MG 24 hr tablet TAKE 1 TABLET BY MOUTH ONCE DAILY (TAKE  WITH  OR  IMMEDIATELY  FOLLOWING  A  MEAL) 90 tablet 0   Omega-3 Fatty Acids (FISH OIL) 875 MG CAPS Take 875 mg by mouth 2 (two) times daily.     pramipexole  (MIRAPEX ) 1.5 MG tablet Take 1 tablet (1.5 mg total) by mouth daily.     rosuvastatin  (CRESTOR ) 20 MG tablet Take 1 tablet (20 mg total) by mouth daily. 90 tablet 3   spironolactone  (ALDACTONE ) 25 MG tablet Take 0.5 tablets (12.5 mg total) by mouth daily. 45 tablet 3   torsemide  (DEMADEX ) 20 MG tablet TAKE 3 TABLETS BY MOUTH IN THE MORNING AND 2 IN THE EVENING 180 tablet 2   No current facility-administered medications on file prior to visit.    There are no Patient Instructions on file for this visit. No follow-ups on file.   Shilah Hefel E Leeba Barbe, NP

## 2023-12-03 ENCOUNTER — Encounter: Payer: Self-pay | Admitting: Cardiology

## 2023-12-03 ENCOUNTER — Ambulatory Visit: Attending: Cardiology | Admitting: Cardiology

## 2023-12-03 VITALS — BP 126/64 | HR 78 | Ht 68.0 in | Wt 209.2 lb

## 2023-12-03 DIAGNOSIS — Z79899 Other long term (current) drug therapy: Secondary | ICD-10-CM | POA: Insufficient documentation

## 2023-12-03 DIAGNOSIS — I5022 Chronic systolic (congestive) heart failure: Secondary | ICD-10-CM | POA: Insufficient documentation

## 2023-12-03 DIAGNOSIS — I471 Supraventricular tachycardia, unspecified: Secondary | ICD-10-CM | POA: Insufficient documentation

## 2023-12-03 NOTE — Progress Notes (Signed)
 Electrophysiology Clinic Note    Date:  12/03/2023  Patient ID:  Ricardo Brown 08-15-1960, MRN [id number] PCP:  Carlean Charter, DO  Cardiologist:  Constancia Delton, MD HF Cardiologist: Mitzie Anda Electrophysiologist: Boyce Byes, MD   Discussed the use of AI scribe software for clinical note transcription with the patient, who gave verbal consent to proceed.   Patient Profile    Chief Complaint: SVT, amio follow-up  History of Present Illness: Ricardo Brown is a 63 y.o. male with PMH notable for SVT, atrial tach, HFmrEF, CAD s/p CABG, HTN, PVD, T2DM, CKD-3; seen today for Boyce Byes, MD for routine electrophysiology followup.   S/p EP study where SVT was inducible, but unable to safely ablation d/t activation near the His bundle.  I last saw him 05/2023 where he was maintaining sinus rhythm on amiodarone . He saw Dr. Junnie Olives for routine follow-up 07/2023, where he was doing well.   On follow-up today, he feels very well from a cardiac standpoint. He has not had any fast heart rates or palpitations since our last visit. He denies chest pain, chest pressure, or SOB. He has a chronic wound on his L foot, managed by vasc surgery. He currently has increased lower extremity edema to the L lower leg, but it is stable. He has intermittent edema to R lower leg, but none currently.     ROS:  Please see the history of present illness. All other systems are reviewed and otherwise negative.    Physical Exam    VS:  BP 126/64 (BP Location: Left Arm, Patient Position: Sitting, Cuff Size: Normal)   Pulse 78   Ht 5\' 8"  (1.727 m)   Wt 209 lb 3.2 oz (94.9 kg)   SpO2 96%   BMI 31.81 kg/m  BMI: Body mass index is 31.81 kg/m.  Wt Readings from Last 3 Encounters:  12/03/23 209 lb 3.2 oz (94.9 kg)  12/01/23 210 lb 3.2 oz (95.3 kg)  09/07/23 215 lb (97.5 kg)     GEN- The patient is well appearing, alert and oriented x 3 today.   Lungs- Clear to  ausculation bilaterally, normal work of breathing.  Heart- Regular rate and rhythm, no murmurs, rubs or gallops Extremities- Trace RLE, 1+ LLE edema, warm, dry    Studies Reviewed   Previous EP, cardiology notes.    EKG is ordered. Personal review of EKG from today shows:    EKG Interpretation Date/Time:  Friday Dec 03 2023 14:03:16 EDT Ventricular Rate:  78 PR Interval:    QRS Duration:  156 QT Interval:  466 QTC Calculation: 531 R Axis:   81  Text Interpretation: Wide QRS rhythm with Premature supraventricular complexes Right bundle branch block Confirmed by Armonee Bojanowski [phone]) on 12/03/2023 2:04:53 PM    TTE, 09/30/2022  1. Left ventricular ejection fraction, by estimation, is 45 to 50%. The left ventricle has mildly decreased function. The left ventricle demonstrates global hypokinesis. Left ventricular diastolic parameters are indeterminate.   2. Right ventricular systolic function is normal. The right ventricular size is normal.   3. Left atrial size was mild to moderately dilated.   4. The mitral valve has been repaired/replaced. No evidence of mitral valve regurgitation. The mean mitral valve gradient is 4.0 mmHg. There is a prosthetic annuloplasty ring present in the mitral position. Procedure Date: 01/2018.   5. The aortic valve was not well visualized. Aortic valve regurgitation is not visualized. Aortic valve sclerosis is  present, with no evidence of aortic valve stenosis.   6. The inferior vena cava is normal in size with greater than 50% respiratory variability, suggesting right atrial pressure of 3 mmHg.   TTE, 12/17/2021  1. Left ventricular ejection fraction, by estimation, is 45 to 50%. The left ventricle has mildly decreased function. The left ventricle demonstrates global hypokinesis. Left ventricular diastolic parameters are consistent with Grade II diastolic dysfunction (pseudonormalization).   2. Right ventricular systolic function is normal. The right ventricular  size is mildly enlarged.   3. The mitral valve has been repaired/replaced. No evidence of mitral valve regurgitation. The mean mitral valve gradient is 3.5 mmHg. There is a prosthetic annuloplasty ring present in the mitral position. Procedure Date: 01/2018.   4. The aortic valve was not well visualized. Aortic valve regurgitation is not visualized.   5. The inferior vena cava is normal in size with <50% respiratory variability, suggesting right atrial pressure of 8 mmHg.    Assessment and Plan     #) SVT #) amiodarone  monitoring Maintaining sinus on amiodarone  Continue 200mg  amio daily Update amio labs today   #) HFmrEF Appears warm and euvolemic, does have increased L lower extremity edema that patient states is stable GDMT: farxiga  10mg , toprol  50mg , spiro 12.5, losartan  25mg  Diuretic: torsemide  60mg  in AM, 40mg  in PM He is past due for HF follow-up, will help coordinate       Current medicines are reviewed at length with the patient today.   The patient does not have concerns regarding his medicines.  The following changes were made today:  none  Labs/ tests ordered today include:  Orders Placed This Encounter  Procedures   Hepatic function panel   TSH   T4, free   EKG 12-Lead     Disposition: Follow up with Dr. Marven Slimmer or EP APP in 6 months   Signed, Aris Even, NP  12/03/23  7:32 PM  Electrophysiology [facility]

## 2023-12-03 NOTE — Patient Instructions (Signed)
 Medication Instructions:  The current medical regimen is effective;  continue present plan and medications as directed. Please refer to the Current Medication list given to you today.   *If you need a refill on your cardiac medications before your next appointment, please call your pharmacy*  Lab Work: Your provider would like for you to have following labs drawn today FLT, TSH, T4.   If you have labs (blood work) drawn today and your tests are completely normal, you will receive your results only by: MyChart Message (if you have MyChart) OR A paper copy in the mail If you have any lab test that is abnormal or we need to change your treatment, we will call you to review the results.  Follow-Up: At [facility], you and your health needs are our priority.  As part of our continuing mission to provide you with exceptional heart care, our providers are all part of one team.  This team includes your primary Cardiologist (physician) and Advanced Practice Providers or APPs (Physician Assistants and Nurse Practitioners) who all work together to provide you with the care you need, when you need it.  Your next appointment:   6 month(s)  Provider:   Harvie Liner, MD or Suzann Riddle, NP    Please schedule with HF clinic- Dr.McLean    We recommend signing up for the patient portal called "MyChart".  Sign up information is provided on this After Visit Summary.  MyChart is used to connect with patients for Virtual Visits (Telemedicine).  Patients are able to view lab/test results, encounter notes, upcoming appointments, etc.  Non-urgent messages can be sent to your provider as well.   To learn more about what you can do with MyChart, go to [email or web address].

## 2023-12-04 LAB — HEPATIC FUNCTION PANEL
ALT: 18 IU/L (ref 0–44)
AST: 21 IU/L (ref 0–40)
Albumin: 4.4 g/dL (ref 3.9–4.9)
Alkaline Phosphatase: 69 IU/L (ref 44–121)
Bilirubin Total: 0.3 mg/dL (ref 0.0–1.2)
Bilirubin, Direct: 0.13 mg/dL (ref 0.00–0.40)
Total Protein: 6.9 g/dL (ref 6.0–8.5)

## 2023-12-04 LAB — TSH: TSH: 2.52 u[IU]/mL (ref 0.450–4.500)

## 2023-12-04 LAB — T4, FREE: Free T4: 1.2 ng/dL (ref 0.82–1.77)

## 2023-12-13 ENCOUNTER — Telehealth: Payer: Self-pay | Admitting: Family

## 2023-12-13 NOTE — Telephone Encounter (Signed)
 Called to confirm/remind patient of their appointment at the [facility] on 12/14/23.   Appointment:   [x] Confirmed  [] Left mess   [] No answer/No voice mail  [] VM Full/unable to leave message  [] Phone not in service  Patient reminded to bring all medications and/or complete list.  Confirmed patient has transportation. Gave directions, instructed to utilize valet parking.

## 2023-12-13 NOTE — Progress Notes (Unsigned)
 Advanced [facility] Clinic Note   Referring Physician: PCP: Carlean Charter, DO PCP-Cardiologist: Constancia Delton, MD   Chief Complaint:  HPI:   PCP: Iona Manis, NP (last seen 08/24) Primary Cardiologist: Constancia Delton, MD (last seen 06/24) HF provider: Peder Bourdon, MD (last seen 05/24)  HPI:  [name] is a 64 y/o male with a history of CKD, DM, hyperlipidemia, atrial tachycardia/ SVT, mitral valve repair with CABG 06/19, CAD s/p CABG 06/19, atrial fibrillation (post CABG), PAD, OSA and chronic heart failure..   Patient had a delayed presentation inferior MI 06/19. Cath showed totally occluded RCA, 90% mLAD, 90% pLCx.  s/p CABG x 3 with MV repair,  LIMA-LAD, SVG-OM2, SVG-PDA at [facility]. He has an ischemic cardiomyopathy, most recent echo in 2/24 showed EF 45-50%, normal RV, s/p MV repair with no MR and mean gradient 4 mmHg, IVC normal. He has PAD with below-the-knee PTCAs on left 11/23, then left TMA 01/24.  He had atrial fibrillation only noted at the time of CABG (post-op) and is not anticoagulated.    Admitted 03/22/23 due to concern for diabetic foot infection. No osteomyelitis on MRI, wound debrided by podiatry. On 03/24/23 angiography w/ balloon angioplasties R anterior tibia;, right tibioperoneal trunk and proximal posterior tibial artery. Initially given IV antibiotics. Per podiatry - No plan for surgical debridement or amputation at this time.      Echo 09/26/19: EF 40-45% with moderate LVH, mild LAE with prosthetic mitral valve Echo 10/29/20: EF 40-45% with mild LVH, mildly elevated PA pressure of 42.9 mmHg with repaired mitral valve with mean gradient of 7.0 mmHg Echo 12/17/21: EF 45-50% with Grade II DD with repaired mitral valve with gradient of 3.5 mmHg Echo 09/30/22: EF 45-50% with mitral gradient of 4.0 mmHg  LHC 01/24/18: Prox RCA to Mid RCA lesion is 20% stenosed. Dist RCA lesion is 100% stenosed. Mid Cx to Dist Cx lesion is 90% stenosed. Prox LAD lesion  is 60% stenosed. Mid LAD lesion is 90% stenosed. Ost 2nd Diag to 2nd Diag lesion is 85% stenosed. Mid LM to Dist LM lesion is 20% stenosed. Prox Cx lesion is 90% stenosed.  1.  Late presenting inferior ST elevation myocardial infarction of at least 48 hours duration.  The culprit is occluded distal right coronary artery with left to right collaterals noted especially to the right PDA.  In addition, there is diffuse diabetic three-vessel coronary artery disease affecting the proximal, mid and distal LAD as well as proximal and distal left circumflex. 2.  Moderately to severely elevated left ventricular end-diastolic pressure with an LVEDP of 29 to 30 mmHg.  Left ventricular angiography was not performed given renal failure.  He presents today for a HF f/u visit with a chief complaint of minimal SOB with moderate exertion. Chronic in nature and improving since recent discharge. Has associated pedal edema and continued wound on right foot. Denies chest pain, palpitations, cough, dizziness or weight gain. Currently has his right foot wrapped and in a diabetic boot.   Was unable to tolerate entresto in the past as he developed SVT with it.     Review of Systems: [y] = yes, [ ]  = no   General: Weight gain [ ] ; Weight loss [ ] ; Anorexia [ ] ; Fatigue [ ] ; Fever [ ] ; Chills [ ] ; Weakness [ ]   Cardiac: Chest pain/pressure [ ] ; Resting SOB [ ] ; Exertional SOB [ ] ; Orthopnea [ ] ; Pedal Edema [ ] ; Palpitations [ ] ; Syncope [ ] ; Presyncope [ ] ;  Paroxysmal nocturnal dyspnea[ ]   Pulmonary: Cough [ ] ; Wheezing[ ] ; Hemoptysis[ ] ; Sputum [ ] ; Snoring [ ]   GI: Vomiting[ ] ; Dysphagia[ ] ; Melena[ ] ; Hematochezia [ ] ; Heartburn[ ] ; Abdominal pain [ ] ; Constipation [ ] ; Diarrhea [ ] ; BRBPR [ ]   GU: Hematuria[ ] ; Dysuria [ ] ; Nocturia[ ]   Vascular: Pain in legs with walking [ ] ; Pain in feet with lying flat [ ] ; Non-healing sores [ ] ; Stroke [ ] ; TIA [ ] ; Slurred speech [ ] ;  Neuro: Headaches[ ] ; Vertigo[ ] ; Seizures[  ]; Paresthesias[ ] ;Blurred vision [ ] ; Diplopia [ ] ; Vision changes [ ]   Ortho/Skin: Arthritis [ ] ; Joint pain [ ] ; Muscle pain [ ] ; Joint swelling [ ] ; Back Pain [ ] ; Rash [ ]   Psych: Depression[ ] ; Anxiety[ ]   Heme: Bleeding problems [ ] ; Clotting disorders [ ] ; Anemia [ ]   Endocrine: Diabetes [ ] ; Thyroid  dysfunction[ ]    Past Medical History:  Diagnosis Date   Chronic combined systolic (congestive) and diastolic (congestive) heart failure (HCC)    a. 01/2018 EF 35-40%; b. 10/2020 Echo: EF 40-45%, glob HK. RVSP 42.60mmHg. Mildly dil LA. Mild MS/AoV sclerosis.   CKD (chronic kidney disease), stage III (HCC)    Coronary artery disease    a. 01/2018 late presenting inferior MI; b. 01/2018 Cath: Severe multivessel dzs-->CABG x 3 (LIMA->LAD, VG->OM2, VG->RPDA @ [facility]).   Diabetes mellitus without complication (HCC)    a. Dx ~ 2000.   Heart palpitations    a. Pt reports prior nl echo's and stress tests. Last stress test w/in past 2 yrs - PCP.   High cholesterol    Hypertension    Mitral regurgitation    a. 01/2018 s/p MV repair @ time of CABG.   Myocardial infarction [facility])    Neuropathy    legs and hands   Postoperative atrial fibrillation    a. 01/2018 @ time of CABG.   PSVT (paroxysmal supraventricular tachycardia) (HCC)    a. Very symptomatic with multiple ED evaluations.  Has been on amio.   Severe sepsis with acute organ dysfunction (HCC) 12/19/2021   Sleep apnea    "mild - has CPAP, doesn't use   Sleep difficulties 09/07/2019   STEMI (ST elevation myocardial infarction) (HCC) 01/24/2018   Wears dentures    full upper and lower    Current Outpatient Medications  Medication Sig Dispense Refill   amiodarone  (PACERONE ) 200 MG tablet Take 1 tablet (200 mg total) by mouth daily. 90 tablet 2   ascorbic acid  (VITAMIN C ) 500 MG tablet Take 1 tablet (500 mg total) by mouth 2 (two) times daily. 60 tablet 3   aspirin  81 MG EC tablet Take 81 mg by mouth daily.      b complex vitamins tablet  Take 1 tablet by mouth daily.      clopidogrel  (PLAVIX ) 75 MG tablet Take 1 tablet by mouth once daily 90 tablet 0   Continuous Glucose Sensor (FREESTYLE LIBRE 3 SENSOR) MISC 1 each by Does not apply route every 14 (fourteen) days. Place 1 sensor on the skin every 14 days. Use to check glucose continuously 2 each 11   dapagliflozin  propanediol (FARXIGA ) 10 MG TABS tablet Take 1 tablet (10 mg total) by mouth daily before breakfast. 30 tablet 6   Dulaglutide (TRULICITY) 1.5 MG/0.5ML SOPN Inject 1.5 mg into the skin once a week.     gabapentin  (NEURONTIN ) 100 MG capsule Take 1 capsule (100 mg total) by mouth 2 (two) times daily.  200 capsule 3   insulin  degludec (TRESIBA) 200 UNIT/ML FlexTouch Pen 32 Units at bedtime.     losartan  (COZAAR ) 25 MG tablet Take 1 tablet (25 mg total) by mouth daily. 90 tablet 0   metFORMIN  (GLUCOPHAGE ) 500 MG tablet Take 2 tablets (1,000 mg total) by mouth 2 (two) times daily.     metoprolol  succinate (TOPROL -XL) 50 MG 24 hr tablet TAKE 1 TABLET BY MOUTH ONCE DAILY (TAKE  WITH  OR  IMMEDIATELY  FOLLOWING  A  MEAL) 90 tablet 0   Omega-3 Fatty Acids (FISH OIL) 875 MG CAPS Take 875 mg by mouth 2 (two) times daily.     pramipexole  (MIRAPEX ) 1.5 MG tablet Take 1 tablet (1.5 mg total) by mouth daily.     rosuvastatin  (CRESTOR ) 20 MG tablet Take 1 tablet (20 mg total) by mouth daily. 90 tablet 3   spironolactone  (ALDACTONE ) 25 MG tablet Take 0.5 tablets (12.5 mg total) by mouth daily. 45 tablet 3   torsemide  (DEMADEX ) 20 MG tablet TAKE 3 TABLETS BY MOUTH IN THE MORNING AND 2 IN THE EVENING 180 tablet 2   No current facility-administered medications for this visit.    Allergies  Allergen Reactions   Lipitor [Atorvastatin] Palpitations    SVT   Sacubitril-Valsartan Other (See Comments)    SVT   Pregabalin Palpitations    SVT      Social History   Socioeconomic History   Marital status: Married    Spouse name: Not on file   Number of children: Not on file   Years  of education: Not on file   Highest education level: 12th grade  Occupational History   Not on file  Tobacco Use   Smoking status: Never   Smokeless tobacco: Never  Vaping Use   Vaping status: Never Used  Substance and Sexual Activity   Alcohol use: Yes    Comment: rare beer   Drug use: No   Sexual activity: Not on file  Other Topics Concern   Not on file  Social History Narrative   Lives locally with wife.  Works in [employer].  Does not routinely exercise.   Social Drivers of Health   Financial Resource Strain: Medium Risk (07/20/2023)   Overall Financial Resource Strain (CARDIA)    Difficulty of Paying Living Expenses: Somewhat hard  Food Insecurity: Food Insecurity Present (07/20/2023)   Hunger Vital Sign    Worried About Running Out of Food in the Last Year: Sometimes true    Ran Out of Food in the Last Year: Sometimes true  Transportation Needs: No Transportation Needs (07/20/2023)   PRAPARE - Administrator, Civil Service (Medical): No    Lack of Transportation (Non-Medical): No  Physical Activity: Insufficiently Active (07/20/2023)   Exercise Vital Sign    Days of Exercise per Week: 3 days    Minutes of Exercise per Session: 30 min  Stress: No Stress Concern Present (07/20/2023)   [employer] of Occupational Health - Occupational Stress Questionnaire    Feeling of Stress : Not at all  Social Connections: Moderately Isolated (07/20/2023)   Social Connection and Isolation Panel [NHANES]    Frequency of Communication with Friends and Family: More than three times a week    Frequency of Social Gatherings with Friends and Family: Three times a week    Attends Religious Services: Never    Active Member of Clubs or Organizations: No    Attends Banker Meetings: Not on file  Marital Status: Married  Catering manager Violence: Not At Risk (03/22/2023)   Humiliation, Afraid, Rape, and Kick questionnaire    Fear of Current or Ex-Partner:  No    Emotionally Abused: No    Physically Abused: No    Sexually Abused: No      Family History  Problem Relation Age of Onset   Cancer Mother        died @ 15   CAD Father        First MI @ 51. S/p heart transplant. Died in mid-50's of cancer.   Cancer Father    Other Brother        alive and well    There were no vitals filed for this visit.   PHYSICAL EXAM: General:  Well appearing. No respiratory difficulty HEENT: normal Neck: supple. no JVD. Carotids 2+ bilat; no bruits. No lymphadenopathy or thyromegaly appreciated. Cor: PMI nondisplaced. Regular rate & rhythm. No rubs, gallops or murmurs. Lungs: clear Abdomen: soft, nontender, nondistended. No hepatosplenomegaly. No bruits or masses. Good bowel sounds. Extremities: no cyanosis, clubbing, rash, edema Neuro: alert & oriented x 3, cranial nerves grossly intact. moves all 4 extremities w/o difficulty. Affect pleasant.  ECG:   ASSESSMENT & PLAN:  1. Ischemic heart failure with mildly reduced ejection fraction- - NYHA class II - minimally fluid overload with pedal edema; difficult to diuresis much due to CKD - weighing daily and reports weight stays within 4 pound range - weight down 4 pounds from last visit here 3 months ago - Echo 09/26/19: EF 40-45% with moderate LVH, mild LAE with prosthetic mitral valve - Echo 10/29/20: EF 40-45% with mild LVH, mildly elevated PA pressure of 42.9 mmHg with repaired mitral valve with mean gradient of 7.0 mmHg - Echo 12/17/21: EF 45-50% with Grade II DD with repaired mitral valve with gradient of 3.5 mmHg - Echo 09/30/22: EF 45-50% with mitral gradient of 4.0 mmHg - ReDs 31% - Continue Farxiga  10 daily - Continue losartan  25 mg daily. Entresto caused SVT - Continue Toprol  XL 50 mg daily - continue spironolactone  12.5mg  - continue torsemide  60mg  AM/ 40mg  PM  - No metolazone  - encouraged to elevate legs when sitting for long periods of time - BNP 12/30/22 was 81.5  2: HTN- - BP  113/51 - saw PCP Marlys Singh) 08/24 - saw nephrology (Korrapati) 08/24 - BMP 03/24/23 showed sodium 137, potassium 3.8, creatinine 1.51 & GFR 52  3: CAD- - saw cardiology (Agbor-Etang) 06/24 - Late presentation inferior MI in 6/19 with CABG x 3 at [facility].  No chest pain.  - Continue ASA 81 and Plavix  75 daily for now.  Could consider dropping ASA in the future.  - continue rosuvastatin  20mg  daily - LDL 12/09/22 was 52  4: Atrial fibrillation-  - EKG today showed NSR with wide QRS - Only noted post-op after CABG in 2019.  No palpitations.  - Anticoagulate if atrial fibrillation is noted to recur.   5: SVT/atrial tachycardia- - saw EP Marven Slimmer) 03/24; have reached out to get f/u appt scheduled - Continue amiodarone  200mg  daily for suppression - LFTs 03/23/23 looked good - TSH 12/09/22 was normal - He will need regular eye exam.   6: PAD- - Had PTCAs to below the knee arteries on left in 11/23.  - In 1/24, he had left transmetatarsal amputation. He is a nonsmoker.  - 03/24/23 angiography w/ balloon angioplasties R anterior tibia;, right tibioperoneal trunk and proximal posterior tibial artery - sees vascular (Dew) with  ABI's 09/24  7: Mitral regurgitation- - S/p MV repair with CABG 06/19 - Valve was stable on last echo.  Return 2 months, sooner if needed   Charlette Console, [place] 12/13/23

## 2023-12-14 ENCOUNTER — Encounter: Payer: Self-pay | Admitting: Family

## 2023-12-14 ENCOUNTER — Ambulatory Visit: Attending: Family | Admitting: Family

## 2023-12-14 VITALS — BP 112/56 | HR 69 | Wt 214.6 lb

## 2023-12-14 DIAGNOSIS — I4891 Unspecified atrial fibrillation: Secondary | ICD-10-CM

## 2023-12-14 DIAGNOSIS — I10 Essential (primary) hypertension: Secondary | ICD-10-CM

## 2023-12-14 DIAGNOSIS — Z7982 Long term (current) use of aspirin: Secondary | ICD-10-CM | POA: Insufficient documentation

## 2023-12-14 DIAGNOSIS — Z7984 Long term (current) use of oral hypoglycemic drugs: Secondary | ICD-10-CM | POA: Diagnosis not present

## 2023-12-14 DIAGNOSIS — Z5986 Financial insecurity: Secondary | ICD-10-CM | POA: Diagnosis not present

## 2023-12-14 DIAGNOSIS — E1122 Type 2 diabetes mellitus with diabetic chronic kidney disease: Secondary | ICD-10-CM

## 2023-12-14 DIAGNOSIS — I5022 Chronic systolic (congestive) heart failure: Secondary | ICD-10-CM | POA: Diagnosis not present

## 2023-12-14 DIAGNOSIS — E1151 Type 2 diabetes mellitus with diabetic peripheral angiopathy without gangrene: Secondary | ICD-10-CM | POA: Insufficient documentation

## 2023-12-14 DIAGNOSIS — I471 Supraventricular tachycardia, unspecified: Secondary | ICD-10-CM | POA: Diagnosis not present

## 2023-12-14 DIAGNOSIS — I4719 Other supraventricular tachycardia: Secondary | ICD-10-CM | POA: Diagnosis not present

## 2023-12-14 DIAGNOSIS — I25810 Atherosclerosis of coronary artery bypass graft(s) without angina pectoris: Secondary | ICD-10-CM | POA: Diagnosis not present

## 2023-12-14 DIAGNOSIS — I255 Ischemic cardiomyopathy: Secondary | ICD-10-CM | POA: Insufficient documentation

## 2023-12-14 DIAGNOSIS — I2510 Atherosclerotic heart disease of native coronary artery without angina pectoris: Secondary | ICD-10-CM | POA: Diagnosis not present

## 2023-12-14 DIAGNOSIS — I9789 Other postprocedural complications and disorders of the circulatory system, not elsewhere classified: Secondary | ICD-10-CM | POA: Diagnosis not present

## 2023-12-14 DIAGNOSIS — I252 Old myocardial infarction: Secondary | ICD-10-CM | POA: Insufficient documentation

## 2023-12-14 DIAGNOSIS — Z79899 Other long term (current) drug therapy: Secondary | ICD-10-CM | POA: Insufficient documentation

## 2023-12-14 DIAGNOSIS — Z7902 Long term (current) use of antithrombotics/antiplatelets: Secondary | ICD-10-CM | POA: Insufficient documentation

## 2023-12-14 DIAGNOSIS — I130 Hypertensive heart and chronic kidney disease with heart failure and stage 1 through stage 4 chronic kidney disease, or unspecified chronic kidney disease: Secondary | ICD-10-CM | POA: Insufficient documentation

## 2023-12-14 DIAGNOSIS — R0602 Shortness of breath: Secondary | ICD-10-CM | POA: Diagnosis present

## 2023-12-14 DIAGNOSIS — Z951 Presence of aortocoronary bypass graft: Secondary | ICD-10-CM | POA: Diagnosis not present

## 2023-12-14 DIAGNOSIS — Z952 Presence of prosthetic heart valve: Secondary | ICD-10-CM | POA: Insufficient documentation

## 2023-12-14 DIAGNOSIS — M7989 Other specified soft tissue disorders: Secondary | ICD-10-CM | POA: Insufficient documentation

## 2023-12-14 DIAGNOSIS — N183 Chronic kidney disease, stage 3 unspecified: Secondary | ICD-10-CM | POA: Insufficient documentation

## 2023-12-14 DIAGNOSIS — I340 Nonrheumatic mitral (valve) insufficiency: Secondary | ICD-10-CM

## 2023-12-14 DIAGNOSIS — N1832 Chronic kidney disease, stage 3b: Secondary | ICD-10-CM

## 2023-12-14 DIAGNOSIS — I739 Peripheral vascular disease, unspecified: Secondary | ICD-10-CM

## 2023-12-14 DIAGNOSIS — Z794 Long term (current) use of insulin: Secondary | ICD-10-CM | POA: Diagnosis not present

## 2023-12-14 DIAGNOSIS — Z7985 Long-term (current) use of injectable non-insulin antidiabetic drugs: Secondary | ICD-10-CM | POA: Insufficient documentation

## 2023-12-14 MED ORDER — TORSEMIDE 20 MG PO TABS: ORAL_TABLET | ORAL | 5 refills | Status: DC

## 2023-12-14 NOTE — Progress Notes (Signed)
 [facility] - HEART FAILURE CLINIC - PHARMACIST COUNSELING NOTE  Ricardo Brown is a 63 y.o. year old male who presents to the HF clinic for a follow-up appointment per heart care. They have a past medical history significant for SVT, atrial tach, HFmrEF, CAD s/p CABG, HTN, PVD, T2DM, CKD-3. They have not had follow-up with HF clinic since last year. Their most recent cardiology appointment was 12/03/2023, no medication changes were made at this time.   Current Guideline-Directed Medical Therapy (GDMT)  ACE/ARB/ARNI: Losartan  25 mg daily Beta Blocker: Metoprolol  succinate 50 mg daily Aldosterone Antagonist: Spironolactone  12.5 mg daily Diuretic: Torsemide  60 mg AM and 40 mg PM SGLT2i: Dapagliflozin  10 mg daily  Vital Signs and Trends  Recent Trends BP Readings from Last 3 Encounters:  12/03/23 126/64  12/01/23 128/72  09/07/23 129/72   Wt Readings from Last 3 Encounters:  12/03/23 209 lb 3.2 oz (94.9 kg)  12/01/23 210 lb 3.2 oz (95.3 kg)  09/07/23 215 lb (97.5 kg)   Today's visit HR 112/56 BP 69 Weight (pounds) 214.6 lbs  Do you check your weight daily? [x] Yes [] No  Do you check you blood pressure daily [x] Yes [] No  PTA BP: checks but don't write it down, no higher than 130s   Cardiac Imaging  ECHO: Date 09/30/2022, EF 45-50%  Changes Since Last Visit   Hospitalizations/ED visits (last 6 months): NA Medication changes: NA  Pertinent Labs  Lab Results  Component Value Date   NA 137 03/24/2023   CL 102 03/24/2023   K 3.8 03/24/2023   CO2 29 03/24/2023   BUN 33 (H) 03/24/2023   CREATININE 1.51 (H) 03/24/2023   GFRNONAA 52 (L) 03/24/2023   CALCIUM  8.3 (L) 03/24/2023   PHOS 3.9 05/27/2022   ALBUMIN 4.4 12/03/2023   GLUCOSE 159 (H) 03/24/2023   Lab Results  Component Value Date   LDLCALC 52 12/09/2022   Lab Results  Component Value Date   HGBA1C 7.1 (H) 03/22/2023   ASSESSMENT  Reason for today's visit   To assess current  guideline directed medical therapy. This is a routine follow-up. They reported having a little bit of fluid on their legs because they've eaten out with their son the last few days. Explained to them that eating high salt diets can cause them to have fluid retention.   Guideline-Directed Medical Therapy Evaluation  ACEi/ARB/ARNi Patient did not tolerate Entresto previously as it caused SVT  Beta-Blocker  Target Achieved [] Yes [x] No Up-titration candidate today [] Yes [x] No   MRA Target Achieved [] Yes [x] No Up-titration candidate today [x] Yes [] No   SGLT-2 Target Achieved [x] Yes [] No Up-titration candidate today [] Yes [x] No  Loop  Increase dose today  [] Yes [x] No  Heart Failure Symptoms & Volume Status   Dyspnea on exertion, mainly when lifting heavy objects [x] Yes [] No  Fatigue [] Yes [x] No  Lower extremity edema, some fluid retention due to eating out a little bit more than usual  [x] Yes [] No  Weight gain (> 5 lbs) [x] Yes [] No  Cough or wheezing  [] Yes [x] No  Abdominal bloating/Discomfort [] Yes [x] No  Early satiety or poor appetite, trouble eating in the morning, pancreas problems  [] Yes [x] No  Dizziness of lightheadedness  [] Yes [x] No  # Pillows used at night: sleep in recliner   Adherence Assessment  Do you ever forget to take your medication? [] Yes [x] No  Do you ever skip doses due to side effects? [] Yes [x] No  Do you have trouble affording your medicines? [] Yes [x] No  Are you  ever unable to pick up your medication due to transportation difficulties? [] Yes [x] No  Do you ever stop taking your medications because you don't believe they are helping? [] Yes [x] No   Adherence strategy: Use a pill box Barriers to obtaining medications: Covered with insurance  Did you take your medications before your appointment today: [x] Yes [] No  Preventative Care   Parameter Most Recent Result Goal/Status Current Medications  LDL 52 < 70 - 55 mg/dL At goal Statin:  Crestor  40 mg daily   A1c 7.1  7 - 8 % At goal Metformin : 500 mg TID  BP Control 112/56 < 130/80 Controlled  NA  ECHO 45-50% 6 - 12 months Last 09/30/2022   PLAN  Medication Interventions:  -- Consider up-titrating spironolactone  to target dose  -- Counseled patient to watch their salt intake to avoid fluid retention  -- Continue current regimen per NP Preventative Care Follow-up:  -- Consider LDL and A1c  Lab or imaging orders:  -- Patient is due for annual ECHO  Time spent: 15 minutes  Thank you for allowing pharmacy to participate in this patient's care.   Shron Ozer K Yamira Papa, Pharm.D. Pharmacy Resident 12/14/2023 7:43 AM

## 2023-12-14 NOTE — Patient Instructions (Addendum)
 Special Instructions // Education:  Wear your compression socks daily and take them off at bedtime.   Follow-Up in: 6 months with Shawnee Dellen, FNP.  At the [facility], you and your health needs are our priority. We have a designated team specialized in the treatment of Heart Failure. This Care Team includes your primary Heart Failure Specialized Cardiologist (physician), Advanced Practice Providers (APPs- Physician Assistants and Nurse Practitioners), and Pharmacist who all work together to provide you with the care you need, when you need it.   You may see any of the following providers on your designated Care Team at your next follow up:  Dr. Jules Oar Dr. Peder Bourdon Dr. Alwin Baars Dr. Judyth Nunnery Shawnee Dellen, FNP Bevely Brush, RPH-CPP  Please be sure to bring in all your medications bottles to every appointment.   Need to Contact Us :  If you have any questions or concerns before your next appointment please send us  a message through [place] or call our office at [phone].    TO LEAVE A MESSAGE FOR THE NURSE SELECT OPTION 2, PLEASE LEAVE A MESSAGE INCLUDING: YOUR NAME DATE OF BIRTH CALL BACK NUMBER REASON FOR CALL**this is important as we prioritize the call backs  YOU WILL RECEIVE A CALL BACK THE SAME DAY AS LONG AS YOU CALL BEFORE 4:00 PM

## 2023-12-21 ENCOUNTER — Encounter (INDEPENDENT_AMBULATORY_CARE_PROVIDER_SITE_OTHER): Payer: Self-pay

## 2024-01-12 ENCOUNTER — Other Ambulatory Visit: Payer: Self-pay | Admitting: Cardiology

## 2024-01-20 ENCOUNTER — Encounter: Payer: Self-pay | Admitting: Family Medicine

## 2024-01-24 ENCOUNTER — Other Ambulatory Visit: Payer: Self-pay | Admitting: Family Medicine

## 2024-01-31 ENCOUNTER — Other Ambulatory Visit: Payer: Self-pay | Admitting: Family Medicine

## 2024-01-31 NOTE — Telephone Encounter (Unsigned)
 Copied from CRM [id number]. Topic: Clinical - Medication Refill >> Jan 31, 2024  1:37 PM Charlet HERO wrote: Medication: dapagliflozin  propanediol (FARXIGA ) 10 MG TABS   Has the patient contacted their pharmacy? {yes/no:20286} Patient wants sent to different pharmacy  This is the patient's preferred pharmacy:  [facility] [id number]  [address] [place] [address] Fax: [phone]   Is this the correct pharmacy for this prescription? Yes If no, delete pharmacy and type the correct one.   Has the prescription been filled recently? Yes  Is the patient out of the medication? Yes  Has the patient been seen for an appointment in the last year OR does the patient have an upcoming appointment? Yes  Can we respond through MyChart? Yes  Agent: Please be advised that Rx refills may take up to 3 business days. We ask that you follow-up with your pharmacy.

## 2024-02-02 ENCOUNTER — Other Ambulatory Visit: Payer: Self-pay | Admitting: Podiatry

## 2024-02-02 ENCOUNTER — Telehealth: Payer: Self-pay | Admitting: *Deleted

## 2024-02-02 MED ORDER — DAPAGLIFLOZIN PROPANEDIOL 10 MG PO TABS
10.0000 mg | ORAL_TABLET | Freq: Every day | ORAL | 0 refills | Status: AC
Start: 2024-02-02 — End: ?

## 2024-02-02 NOTE — Telephone Encounter (Signed)
   Patient Name: Ricardo [name]  DOB: Dec 21, 1960 MRN: [id number]  Primary Cardiologist: Redell Cave, MD  Chart reviewed as part of pre-operative protocol coverage. Given past medical history and time since last visit, based on ACC/AHA guidelines,   Per Melany Needle, NP (02/02/2024)  Ricardo [name] is at acceptable risk but elevated risk (He has mildly reduced LVEF, h/o CABG, on insulin , with Cr hovering around 2 RCRI = 7, so he is at an elevated risk).   When I saw him he was overall doing very well with good functional capacity.  for the planned procedure without further cardiovascular testing.   The patient was advised that if he develops new symptoms prior to surgery to contact our office to arrange for a follow-up visit, and he verbalized understanding.  I will route this recommendation to the requesting party via Epic fax function and remove from pre-op pool.  Please call with questions.  Lamarr Satterfield, NP 02/02/2024, 4:39 PM

## 2024-02-02 NOTE — Telephone Encounter (Signed)
   Pre-operative Risk Assessment    Patient Name: Ricardo [name]  DOB: [birth year] MRN: [id number]   Date of last office visit: 12/14/23 [facility] Date of next office visit: 06/13/24 [facility]   Request for Surgical Clearance    Procedure:  LEFT FOOT EXOSTECTOMY THIRD METATARSAL, GASTROC RECESSION   Date of Surgery:  Clearance 02/24/24                                Surgeon:  DR. PRENTICE LEE Surgeon's Group or Practice Name:  [facility]  Phone number:  [phone] Fax number:  [phone]   Type of Clearance Requested:   - Medical  - Pharmacy:  Hold Aspirin      Type of Anesthesia:  CHOICE   Additional requests/questions:    [name]   02/02/2024, 3:42 PM

## 2024-02-02 NOTE — Telephone Encounter (Signed)
 Ricardo [name],  You saw this patient on 12/03/2023. Per protocol we request that you comment on his cardiac risk to proceed with LEFT FOOT EXOSTECTOMY THIRD METATARSAL, GASTROC RECESSION on 02/24/2024,since it has been less than 2 months since evaluated in the office. Please send your comment to P CV Pre-Op Pool.  Per office protocol, if patient is without any new symptoms or concerns at the time of their virtual visit, he may hold aspirin  for 7 days prior to procedure. Please resume aspirin  as soon as possible postprocedure, at the discretion of the surgeon.    Thank you, Lamarr Satterfield DNP, ANP, AACC.

## 2024-02-02 NOTE — Telephone Encounter (Signed)
 Appt 02/28/24- courtesy refill given #30 Requested Prescriptions  Pending Prescriptions Disp Refills   dapagliflozin  propanediol (FARXIGA ) 10 MG TABS tablet 30 tablet 0    Sig: Take 1 tablet (10 mg total) by mouth daily before breakfast.     Endocrinology:  Diabetes - SGLT2 Inhibitors Failed - 02/02/2024 11:16 AM      Failed - Cr in normal range and within 360 days    Creat  Date Value Ref Range Status  09/30/2020 1.31 0.70 - 1.33 mg/dL Final    Comment:    For patients >49 years of age, the reference limit for Creatinine is approximately 13% higher for people identified as African-American. .    Creatinine, Ser  Date Value Ref Range Status  03/24/2023 1.51 (H) 0.61 - 1.24 mg/dL Final   Creatinine, Urine  Date Value Ref Range Status  01/07/2023 90  Final         Failed - HBA1C is between 0 and 7.9 and within 180 days    Hemoglobin A1C  Date Value Ref Range Status  05/23/2021 7.2  Final   Hgb A1c MFr Bld  Date Value Ref Range Status  03/22/2023 7.1 (H) 4.8 - 5.6 % Final    Comment:    (NOTE) Pre diabetes:          5.7%-6.4%  Diabetes:              >6.4%  Glycemic control for   <7.0% adults with diabetes          Failed - eGFR in normal range and within 360 days    GFR, Est African American  Date Value Ref Range Status  09/30/2020 69 > OR = 60 mL/min/1.52m2 Final   GFR, Est Non African American  Date Value Ref Range Status  09/30/2020 59 (L) > OR = 60 mL/min/1.50m2 Final   GFR, Estimated  Date Value Ref Range Status  03/24/2023 52 (L) >60 mL/min Final    Comment:    (NOTE) Calculated using the CKD-EPI Creatinine Equation (2021)    eGFR  Date Value Ref Range Status  08/26/2021 29 (L) >59 mL/min/1.73 Final         Failed - Valid encounter within last 6 months    Recent Outpatient Visits   None     Future Appointments             In 3 weeks [name], Ricardo SAILOR, DO [facility]

## 2024-02-08 ENCOUNTER — Other Ambulatory Visit (INDEPENDENT_AMBULATORY_CARE_PROVIDER_SITE_OTHER): Payer: Self-pay | Admitting: Nurse Practitioner

## 2024-02-16 ENCOUNTER — Encounter: Payer: Self-pay | Admitting: Podiatry

## 2024-02-18 NOTE — Anesthesia Preprocedure Evaluation (Addendum)
 Anesthesia Evaluation  Patient identified by MRN, date of birth, ID band Patient awake    Reviewed: Allergy & Precautions, H&P , NPO status , Patient's Chart, lab work & pertinent test results  Airway Mallampati: II  TM Distance: >3 FB Neck ROM: Full    Dental no notable dental hx. (+) Edentulous Upper, Edentulous Lower   Pulmonary neg pulmonary ROS, sleep apnea    Pulmonary exam normal breath sounds clear to auscultation       Cardiovascular hypertension, + CAD, + Past MI, + Peripheral Vascular Disease and +CHF  Normal cardiovascular exam Rhythm:Regular Rate:Normal  01-24-18 heart cath   Prox RCA to Mid RCA lesion is 20% stenosed.  Dist RCA lesion is 100% stenosed.  Mid Cx to Dist Cx lesion is 90% stenosed.  Prox LAD lesion is 60% stenosed.  Mid LAD lesion is 90% stenosed.  Ost 2nd Diag to 2nd Diag lesion is 85% stenosed.  Mid LM to Dist LM lesion is 20% stenosed.  Prox Cx lesion is 90% stenosed.   1.  Late presenting inferior ST elevation myocardial infarction of at least 48 hours duration.  The culprit is occluded distal right coronary artery with left to right collaterals noted especially to the right PDA.  In addition, there is diffuse diabetic three-vessel coronary artery disease affecting the proximal, mid and distal LAD as well as proximal and distal left circumflex. 2.  Moderately to severely elevated left ventricular end-diastolic pressure with an LVEDP of 29 to 30 mmHg.  Left ventricular angiography was not performed given renal failure.   Recommendations: This is a difficult situation overall.  I do not think the patient will benefit from revascularization of the RCA acutely given late presentation and the presence of collaterals.  He does have diffuse diabetic three-vessel coronary artery disease with cardiomyopathy.  Thus, best long-term option is likely CABG in spite of his suboptimal targets overall. The  patient is currently hemodynamically stable with no symptoms.  I discussed the case with Dr. [place] and will get the patient off the table and transfer for evaluation of CABG .  I elected not to give any hydration and can diurese gently with close monitoring of renal function. 80 ml of contrast was used.   02-28-124 echo 1. Left ventricular ejection fraction, by estimation, is 45 to 50%. The  left ventricle has mildly decreased function. The left ventricle  demonstrates global hypokinesis. Left ventricular diastolic parameters are  indeterminate.   2. Right ventricular systolic function is normal. The right ventricular  size is normal.   3. Left atrial size was mild to moderately dilated.   4. The mitral valve has been repaired/replaced. No evidence of mitral  valve regurgitation. The mean mitral valve gradient is 4.0 mmHg. There is  a prosthetic annuloplasty ring present in the mitral position. Procedure  Date: 01/2018.   5. The aortic valve was not well visualized. Aortic valve regurgitation  is not visualized. Aortic valve sclerosis is present, with no evidence of  aortic valve stenosis.   6. The inferior vena cava is normal in size with greater than 50%  respiratory variability, suggesting right atrial pressure of 3 mmHg.     Neuro/Psych Right face droop, right eye does not open so widely as left eye; patient states this is due to hx Bell's palsy years ago.  negative neurological ROS  negative psych ROS   GI/Hepatic negative GI ROS, Neg liver ROS,,,  Endo/Other  diabetes    Renal/GU Renal diseasenegative Renal  ROS  negative genitourinary   Musculoskeletal negative musculoskeletal ROS (+)    Abdominal   Peds negative pediatric ROS (+)  Hematology negative hematology ROS (+)   Anesthesia Other Findings Has cardiac letter of risk and cardiac OK to stop Plavix  but continue aspirin  States he has severe peripheral neuropathy and does not have sensation in foot when doc  examines  Heart palpitations Diabetes mellitus without complication High cholesterol Chronic combined systolic (congestive) and diastolic (congestive) heart failure Coronary artery disease Hypertension Myocardial infarction [facility]) Mitral regurgitation PSVT (paroxysmal supraventricular tachycardia) Postoperative atrial fibrillation STEMI (ST elevation myocardial infarction) (HCC) CKD (chronic kidney disease), stage III )Sleep difficulties Hx Severe sepsis with acute organ dysfunction (HCC) Sleep apnea Wears dentures Neuropathy History of mitral valve replacement with bioprosthetic valve Atrial fibrillation (HCC) Bell's palsy hx  Reproductive/Obstetrics negative OB ROS                              Anesthesia Physical Anesthesia Plan  ASA: 3  Anesthesia Plan:    Post-op Pain Management:    Induction:   PONV Risk Score and Plan:   Airway Management Planned:   Additional Equipment:   Intra-op Plan:   Post-operative Plan:   Informed Consent:   Plan Discussed with:   Anesthesia Plan Comments:          Anesthesia Quick Evaluation

## 2024-02-21 NOTE — Telephone Encounter (Signed)
 In regards to anti-platelet therapy, patient may hold Plavix  for 5 days prior to procedure. Ideally aspirin  should be continued without interruption, however if the bleeding risk is too great, aspirin  may be held for 5-7 days prior to surgery. Please resume aspirin  post operatively when it is felt to be safe from a bleeding standpoint.   Rosaline EMERSON Bane, NP-C  02/21/2024, 4:29 PM [address] [place], [place] [address] Office [phone] Fax [phone]

## 2024-02-21 NOTE — Telephone Encounter (Signed)
 Notes have been faxed to surgeon office per Rosaline Bane, NP

## 2024-02-21 NOTE — Telephone Encounter (Signed)
 Dr. Lennie wants to get written approval to hold patient's Plavix  as patient needs to have further surgery.

## 2024-02-24 ENCOUNTER — Encounter: Payer: Self-pay | Admitting: Podiatry

## 2024-02-24 ENCOUNTER — Other Ambulatory Visit: Payer: Self-pay

## 2024-02-24 ENCOUNTER — Encounter
Admission: RE | Disposition: A | Discharge status: Home / Self Care | Payer: Self-pay | Source: Home / Self Care | Attending: Podiatry

## 2024-02-24 ENCOUNTER — Ambulatory Visit: Payer: Self-pay | Admitting: Anesthesiology

## 2024-02-24 ENCOUNTER — Ambulatory Visit: Payer: Self-pay

## 2024-02-24 ENCOUNTER — Ambulatory Visit
Admission: RE | Admit: 2024-02-24 | Discharge: 2024-02-24 | Disposition: A | Discharge status: Home / Self Care | Attending: Podiatry | Admitting: Podiatry

## 2024-02-24 DIAGNOSIS — N183 Chronic kidney disease, stage 3 unspecified: Secondary | ICD-10-CM | POA: Diagnosis not present

## 2024-02-24 DIAGNOSIS — I4891 Unspecified atrial fibrillation: Secondary | ICD-10-CM | POA: Insufficient documentation

## 2024-02-24 DIAGNOSIS — E1142 Type 2 diabetes mellitus with diabetic polyneuropathy: Secondary | ICD-10-CM | POA: Insufficient documentation

## 2024-02-24 DIAGNOSIS — L97522 Non-pressure chronic ulcer of other part of left foot with fat layer exposed: Secondary | ICD-10-CM | POA: Diagnosis present

## 2024-02-24 DIAGNOSIS — E11621 Type 2 diabetes mellitus with foot ulcer: Secondary | ICD-10-CM | POA: Insufficient documentation

## 2024-02-24 DIAGNOSIS — I2510 Atherosclerotic heart disease of native coronary artery without angina pectoris: Secondary | ICD-10-CM | POA: Diagnosis not present

## 2024-02-24 DIAGNOSIS — E1122 Type 2 diabetes mellitus with diabetic chronic kidney disease: Secondary | ICD-10-CM | POA: Insufficient documentation

## 2024-02-24 DIAGNOSIS — M899 Disorder of bone, unspecified: Secondary | ICD-10-CM | POA: Insufficient documentation

## 2024-02-24 DIAGNOSIS — I5042 Chronic combined systolic (congestive) and diastolic (congestive) heart failure: Secondary | ICD-10-CM | POA: Diagnosis not present

## 2024-02-24 DIAGNOSIS — G4730 Sleep apnea, unspecified: Secondary | ICD-10-CM | POA: Insufficient documentation

## 2024-02-24 DIAGNOSIS — I252 Old myocardial infarction: Secondary | ICD-10-CM | POA: Insufficient documentation

## 2024-02-24 DIAGNOSIS — I130 Hypertensive heart and chronic kidney disease with heart failure and stage 1 through stage 4 chronic kidney disease, or unspecified chronic kidney disease: Secondary | ICD-10-CM | POA: Diagnosis not present

## 2024-02-24 DIAGNOSIS — M216X2 Other acquired deformities of left foot: Secondary | ICD-10-CM | POA: Diagnosis present

## 2024-02-24 DIAGNOSIS — L97422 Non-pressure chronic ulcer of left heel and midfoot with fat layer exposed: Secondary | ICD-10-CM | POA: Insufficient documentation

## 2024-02-24 DIAGNOSIS — Z952 Presence of prosthetic heart valve: Secondary | ICD-10-CM | POA: Insufficient documentation

## 2024-02-24 HISTORY — PX: GASTROC RECESSION EXTREMITY: SHX6262

## 2024-02-24 HISTORY — DX: Presence of xenogenic heart valve: Z95.3

## 2024-02-24 HISTORY — DX: Personal history of other diseases of the nervous system and sense organs: Z86.69

## 2024-02-24 HISTORY — PX: METATARSAL HEAD EXCISION: SHX5027

## 2024-02-24 HISTORY — DX: Unspecified atrial fibrillation: I48.91

## 2024-02-24 HISTORY — PX: WOUND DEBRIDEMENT: SHX247

## 2024-02-24 LAB — GLUCOSE, CAPILLARY: Glucose-Capillary: 159 mg/dL — ABNORMAL HIGH (ref 70–99)

## 2024-02-24 SURGERY — RECESSION, TENDON, GASTROCNEMIUS
Anesthesia: Choice | Site: Third Toe | Laterality: Left

## 2024-02-24 MED ORDER — CEFAZOLIN SODIUM-DEXTROSE 2-4 GM/100ML-% IV SOLN
2.0000 g | INTRAVENOUS | Status: AC
  Administered 2024-02-24: 2 g via INTRAVENOUS

## 2024-02-24 MED ORDER — FENTANYL CITRATE (PF) 100 MCG/2ML IJ SOLN
INTRAMUSCULAR | Status: AC
  Filled 2024-02-24: qty 2

## 2024-02-24 MED ORDER — FENTANYL CITRATE (PF) 100 MCG/2ML IJ SOLN
INTRAMUSCULAR | Status: DC | PRN
  Administered 2024-02-24 (×2): 25 ug via INTRAVENOUS
  Administered 2024-02-24: 50 ug via INTRAVENOUS

## 2024-02-24 MED ORDER — EPHEDRINE 5 MG/ML INJ
INTRAVENOUS | Status: AC
  Filled 2024-02-24: qty 5

## 2024-02-24 MED ORDER — OXYCODONE HCL 5 MG PO TABS: 10.0000 mg | ORAL_TABLET | Freq: Once | ORAL | Status: DC

## 2024-02-24 MED ORDER — LIDOCAINE HCL (PF) 2 % IJ SOLN
INTRAMUSCULAR | Status: AC
  Filled 2024-02-24: qty 5

## 2024-02-24 MED ORDER — MIDAZOLAM HCL 2 MG/2ML IJ SOLN
INTRAMUSCULAR | Status: AC
  Filled 2024-02-24: qty 2

## 2024-02-24 MED ORDER — BUPIVACAINE LIPOSOME 1.3 % IJ SUSP
INTRAMUSCULAR | Status: AC
Start: 2024-02-24 — End: 2024-02-24
  Filled 2024-02-24: qty 20

## 2024-02-24 MED ORDER — PROPOFOL 10 MG/ML IV BOLUS
INTRAVENOUS | Status: DC | PRN
  Administered 2024-02-24: 200 mg via INTRAVENOUS

## 2024-02-24 MED ORDER — BUPIVACAINE LIPOSOME 1.3 % IJ SUSP
INTRAMUSCULAR | Status: DC | PRN
  Administered 2024-02-24: 20 mL

## 2024-02-24 MED ORDER — MIDAZOLAM HCL 2 MG/2ML IJ SOLN
2.0000 mg | Freq: Once | INTRAMUSCULAR | Status: AC
Start: 2024-02-24 — End: 2024-02-24
  Administered 2024-02-24 (×2): 1 mg via INTRAVENOUS

## 2024-02-24 MED ORDER — ONDANSETRON HCL 4 MG/2ML IJ SOLN
INTRAMUSCULAR | Status: DC | PRN
  Administered 2024-02-24: 4 mg via INTRAVENOUS

## 2024-02-24 MED ORDER — CIPROFLOXACIN HCL 500 MG PO TABS: 500.0000 mg | ORAL_TABLET | Freq: Two times a day (BID) | ORAL | 0 refills | Status: AC

## 2024-02-24 MED ORDER — PROPOFOL 10 MG/ML IV BOLUS
INTRAVENOUS | Status: AC
  Filled 2024-02-24: qty 20

## 2024-02-24 MED ORDER — ONDANSETRON HCL 4 MG/2ML IJ SOLN
INTRAMUSCULAR | Status: AC
  Filled 2024-02-24: qty 2

## 2024-02-24 MED ORDER — LIDOCAINE HCL (CARDIAC) PF 100 MG/5ML IV SOSY
PREFILLED_SYRINGE | INTRAVENOUS | Status: DC | PRN
  Administered 2024-02-24: 100 mg via INTRATRACHEAL

## 2024-02-24 MED ORDER — PHENYLEPHRINE 80 MCG/ML (10ML) SYRINGE FOR IV PUSH (FOR BLOOD PRESSURE SUPPORT)
PREFILLED_SYRINGE | INTRAVENOUS | Status: AC
Start: 2024-02-24 — End: 2024-02-24
  Filled 2024-02-24: qty 10

## 2024-02-24 MED ORDER — CEFAZOLIN SODIUM-DEXTROSE 2-3 GM-%(50ML) IV SOLR
INTRAVENOUS | Status: AC
  Filled 2024-02-24: qty 50

## 2024-02-24 MED ORDER — BUPIVACAINE HCL 0.5 % IJ SOLN
INTRAMUSCULAR | Status: DC | PRN
Start: 2024-02-24 — End: 2024-02-24
  Administered 2024-02-24: 20 mL

## 2024-02-24 MED ORDER — AMOXICILLIN-POT CLAVULANATE 875-125 MG PO TABS: 1.0000 | ORAL_TABLET | Freq: Two times a day (BID) | ORAL | 0 refills | Status: DC

## 2024-02-24 MED ORDER — LACTATED RINGERS IV SOLN: INTRAVENOUS | Status: DC

## 2024-02-24 MED ORDER — EPHEDRINE SULFATE (PRESSORS) 50 MG/ML IJ SOLN
INTRAMUSCULAR | Status: DC | PRN
  Administered 2024-02-24: 10 mg via INTRAVENOUS
  Administered 2024-02-24 (×3): 5 mg via INTRAVENOUS

## 2024-02-24 MED ORDER — SEVOFLURANE IN SOLN
RESPIRATORY_TRACT | Status: AC
  Filled 2024-02-24: qty 250

## 2024-02-24 MED ORDER — 0.9 % SODIUM CHLORIDE (POUR BTL) OPTIME
TOPICAL | Status: DC | PRN
  Administered 2024-02-24: 500 mL

## 2024-02-24 MED ORDER — HYDROCODONE-ACETAMINOPHEN 5-325 MG PO TABS: 1.0000 | ORAL_TABLET | Freq: Four times a day (QID) | ORAL | 0 refills | Status: AC | PRN

## 2024-02-24 MED ORDER — FENTANYL CITRATE (PF) 100 MCG/2ML IJ SOLN: 100.0000 ug | Freq: Once | INTRAMUSCULAR | Status: DC

## 2024-02-24 MED ORDER — PHENYLEPHRINE HCL (PRESSORS) 10 MG/ML IV SOLN
INTRAVENOUS | Status: DC | PRN
  Administered 2024-02-24 (×5): 80 ug via INTRAVENOUS

## 2024-02-24 SURGICAL SUPPLY — 33 items
BLADE OSC/SAGITTAL MD 9X18.5 (BLADE) IMPLANT
BNDG ELASTIC 4X5.8 VLCR NS LF (GAUZE/BANDAGES/DRESSINGS) ×3 IMPLANT
BNDG ESMARCH 4X12 STRL LF (GAUZE/BANDAGES/DRESSINGS) ×3 IMPLANT
BNDG GAUZE DERMACEA FLUFF 4 (GAUZE/BANDAGES/DRESSINGS) ×3 IMPLANT
BOOT STEPPER DURA LG (SOFTGOODS) IMPLANT
CANISTER SUCT 1200ML W/VALVE (MISCELLANEOUS) ×3 IMPLANT
COVER LIGHT HANDLE UNIVERSAL (MISCELLANEOUS) ×6 IMPLANT
DRAPE FLUOR MINI C-ARM 54X84 (DRAPES) ×3 IMPLANT
DRSG EMULSION OIL 3X3 NADH (GAUZE/BANDAGES/DRESSINGS) IMPLANT
DRSG TEGADERM 4X4.75 (GAUZE/BANDAGES/DRESSINGS) IMPLANT
DRSG TELFA 4X3 1S NADH ST (GAUZE/BANDAGES/DRESSINGS) IMPLANT
DURAPREP 26ML APPLICATOR (WOUND CARE) ×3 IMPLANT
ELECTRODE REM PT RTRN 9FT ADLT (ELECTROSURGICAL) ×3 IMPLANT
GAUZE SPONGE 4X4 12PLY STRL (GAUZE/BANDAGES/DRESSINGS) ×3 IMPLANT
GAUZE XEROFORM 1X8 LF (GAUZE/BANDAGES/DRESSINGS) ×3 IMPLANT
GLOVE BIO SURGEON STRL SZ7.5 (GLOVE) IMPLANT
GLOVE BIOGEL PI IND STRL 7.5 (GLOVE) ×3 IMPLANT
GLOVE SURG SS PI 6.5 STRL IVOR (GLOVE) IMPLANT
GLOVE SURG SS PI 7.0 STRL IVOR (GLOVE) ×3 IMPLANT
GOWN STRL REUS W/ TWL LRG LVL3 (GOWN DISPOSABLE) ×6 IMPLANT
KIT TURNOVER KIT A (KITS) ×3 IMPLANT
MATRIX WOUND MESHED 2X2 (Tissue) IMPLANT
NS IRRIG 500ML POUR BTL (IV SOLUTION) ×3 IMPLANT
PACK EXTREMITY ARMC (MISCELLANEOUS) ×3 IMPLANT
PAD ABD DERMACEA PRESS 5X9 (GAUZE/BANDAGES/DRESSINGS) IMPLANT
PADDING CAST BLEND 4X4 NS (MISCELLANEOUS) ×9 IMPLANT
PENCIL SMOKE EVACUATOR (MISCELLANEOUS) ×3 IMPLANT
RASP SM TEAR CROSS CUT (RASP) IMPLANT
SPLINT CAST 1 STEP 4X30 (MISCELLANEOUS) ×3 IMPLANT
STAPLER SKIN PROX 35W (STAPLE) IMPLANT
STOCKINETTE IMPERVIOUS LG (DRAPES) ×3 IMPLANT
SUT ETHILON 3-0 (SUTURE) IMPLANT
SUT VIC AB 3-0 SH 27X BRD (SUTURE) IMPLANT

## 2024-02-24 NOTE — Transfer of Care (Signed)
 Immediate Anesthesia Transfer of Care Note  Patient: Ricardo Brown  Procedure(s) Performed: RECESSION, TENDON, GASTROCNEMIUS (Left: Leg Lower) EXCISION, METATARSAL BONE, HEAD (Left: Third Toe) DEBRIDEMENT, WOUND (Left: Foot)  Patient Location: PACU  Anesthesia Type: No value filed.  Level of Consciousness: awake, alert  and patient cooperative  Airway and Oxygen Therapy: Patient Spontanous Breathing and Patient connected to supplemental oxygen  Post-op Assessment: Post-op Vital signs reviewed, Patient's Cardiovascular Status Stable, Respiratory Function Stable, Patent Airway and No signs of Nausea or vomiting  Post-op Vital Signs: Reviewed and stable  Complications: No notable events documented.

## 2024-02-24 NOTE — Anesthesia Procedure Notes (Signed)
 Procedure Name: LMA Insertion Date/Time: 02/24/2024 7:38 AM  Performed by: Jerauld Bostwick, CRNAPre-anesthesia Checklist: Patient identified, Emergency Drugs available, Suction available, Patient being monitored and Timeout performed Patient Re-evaluated:Patient Re-evaluated prior to induction Oxygen Delivery Method: Circle system utilized Preoxygenation: Pre-oxygenation with 100% oxygen Induction Type: IV induction LMA: LMA inserted LMA Size: 5.0 Tube type: Oral Number of attempts: 1 Placement Confirmation: positive ETCO2, CO2 detector and breath sounds checked- equal and bilateral Tube secured with: Tape Dental Injury: Teeth and Oropharynx as per pre-operative assessment

## 2024-02-24 NOTE — Op Note (Signed)
 PODIATRY / FOOT AND ANKLE SURGERY OPERATIVE REPORT    SURGEON: Prentice Lee, DPM  PRE-OPERATIVE DIAGNOSIS:  1.  Left plantar forefoot subcutaneous ulceration 1.8 x 2 x 0.6 cm 2.  Left plantar forefoot/midfoot exostosis 3.  Equinus left  POST-OPERATIVE DIAGNOSIS: Same  PROCEDURE(S): 1.  Left wound debridement, subcutaneous 100% excisional 2.  Left foot wound Integra bilayer graft application 2 x 2 cm 3.  Left forefoot exostectomy 4.  Left gastroc recession  HEMOSTASIS: Left thigh tourniquet  ANESTHESIA: MAC  ESTIMATED BLOOD LOSS: 30 cc  FINDING(S): 1.  Exostosis left 2nd/3rd metatarsal distal cut area from TMA site no signs of infection present 2.  Equinus 3.  Left forefoot/midfoot subcutaneous ulceration  PATHOLOGY/SPECIMEN(S): Left foot bone specimen  INDICATIONS:   Ricardo Brown is a 63 y.o. male who presents with a nonhealing ulceration to the left plantar forefoot/midfoot secondary to increased plantar pressure from Aquinas and exostosis.  X-ray imaging showed some excess bone growth in the area of the transmetatarsal amputation site and the area of the ulceration.  Patient also appears to have fairly significant equinus.  Patient was offloaded and skin grafts have been applied but patient continues to be ulcerate without much improvement.All treatment options were discussed with the patient of both conservative and surgical attempts at correction including potential risks and complications.  Patient has elected for procedure consisting of described procedure above and below.  No guarantees given.  Consent obtained.   DESCRIPTION: After obtaining full informed written consent, the patient was brought back to the operating room and placed supine upon the operating table.  The patient received IV antibiotics prior to induction.  After obtaining adequate anesthesia, the patient was prepped and draped in the standard fashion an Esmarch bandage used to exsanguinate the  left lower extremity and pneumatic thigh tourniquet was inflated.  20 cc of quarter percent Marcaine  plain was injected about the operative areas about the left lower calf and left forefoot/midfoot  Attention was then directed to the left posterior lower leg slightly distal to the gastroc muscle belly.  An incision was made perpendicular to the long axis of the leg along the midline of the leg.  The incision was deepened through the subcutaneous tissues utilizing blunt and sharp dissection.  Care was taken to identify and retract all vital neurovascular structures no venous contributories were cauterized necessary.  The saphenous vein and sural nerve were identified and retracted and protected throughout the remainder of the case.  At this time a incision was made into the deep fascia.  The deep fascia was retracted medially and laterally and the gastroc aponeurosis was identified.  The gastroc aponeurosis and plantaris tendon was resected at the operative site.  This appeared to improve dorsiflexion greatly with the knee extended and with the knee flexed.  The surgical site was flushed with copious amounts normal sterile saline.  The deep fascia was then reapproximated well coapted with 3-0 Vicryl.  The subcutaneous tissue was reapproximated well coapted with 3-0 Vicryl.  The skin was then reapproximated well coapted with staples.  Nonadherent and Tegaderm was then applied.  10 cc of Exparel  was then injected about the area.  Attention was then directed to the left forefoot/midfoot area where the ulceration was the ulceration measured approximately 1.8 x 1.6 x 0.6 cm prior to debridement.  Under fluoroscopic guidance this area was marked with a metallic object and a incision was then marked dorsally to correspond with this area of pressure.  The  incision was then made at the dorsal aspect of the foot.  The incision was made all the way down to bone.  Full-thickness flaps were then raised off of the  transmetatarsal amputation site stump near the 2nd and 3rd metatarsals distally.  The second third metatarsals were then dissected free of any scar tissue/adhesions thereby exposing the bone at the operative site.  There appeared to be a prominent 2nd/3rd metatarsal plantar surface likely from heterotopic bone formation after transmetatarsal amputation.  At this time a sagittal bone saw was used to resect this heterotopic bone formation.  The paddle rasp was then used to smooth the area out further.  The resected bone was then passed off the operative site and sent off to pathology.  There did not appear to be any signs of infection clinically.  The surgical site was flushed with copious amounts normal sterile saline.  The deep periosteal structures as well as the subcutaneous tissue were reapproximated well coapted with 3-0 Vicryl.  The skin was then reapproximated well coapted with 3-0 nylon.  Attention was then directed to the plantar wound.  Wound debridement was performed, 100% excisional subcutaneous debridement to this area with use of 15 blade and tissue nipper.  This got the wound down to healthy bleeding surface.  It probes fairly deep to the plantar musculature and subcutaneous fat area but did not really go much past the fat tissue.  After debridement wound measured approximately the same measurements 1.8 x 1.6 x 0.6 cm.  After wound was adequately prepped the wound was flushed with copious amounts normal sterile saline.  The 2 x 2 Integra bilayer skin graft was then placed with the appropriate orientation and cut slightly smaller and placed over the area and stapled in place.  The excess was then resected and the collagen was was removed from the excess getting rid of the silicone layer.  The collagen was then packed in the wound to complete the procedure.  10 cc additionally of Exparel  was injected about the operative areas at the forefoot and plantar wound.  Postoperative dressing was applied  consisting of Adaptic to the incision and wound site followed by 4 x 4 gauze to all procedural areas, ABD pads, Kerlix, Ace wrap, tall cam boot.  The pneumatic thigh tourniquet was deflated and prompt hyperemic response was noted to the left foot prior to placing the dressing.  The patient is to remain nonweightbearing at all times left lower extremity.  The patient brought back to the recovery room with vital signs stable and vascular status intact to all toes of both feet/left forefoot/midfoot.  The patient tolerated the procedure and anesthesia well overall.  Following a period of postoperative monitoring the patient be discharged home with the appropriate orders, instructions, and medications.  COMPLICATIONS: None  CONDITION: Good, stable  Prentice Lee, DPM

## 2024-02-24 NOTE — H&P (Signed)
 HISTORY AND PHYSICAL INTERVAL NOTE:  02/24/2024  7:12 AM  Ricardo Brown  has presented today for surgery, with the diagnosis of Type 2 diabetes mellitus with polyneuropathy Ulcer of left foot, with fat layer exposed Plantar flexed metatarsal, left Short Achilles tendon, left.  The various methods of treatment have been discussed with the patient.  No guarantees were given.  After consideration of risks, benefits and other options for treatment, the patient has consented to surgery.  I have reviewed the patients' chart and labs.    PROCEDURE: LEFT FOOT EXOSTECTOMY 2.   LEFT FOOT WOUND DEBRIDEMENT 3.   LEFT LEG GASTROC RECESSION 4.   LEFT FOOT POSSIBLE SKIN GRAFT PLACEMENT  A history and physical examination was performed in my office.  The patient was reexamined.  There have been no changes to this history and physical examination.  Prentice Lee, DPM

## 2024-02-24 NOTE — Anesthesia Postprocedure Evaluation (Signed)
 Anesthesia Post Note  Patient: Ricardo Brown  Procedure(s) Performed: RECESSION, TENDON, GASTROCNEMIUS (Left: Leg Lower) EXCISION, METATARSAL BONE, HEAD (Left: Third Toe) DEBRIDEMENT, WOUND (Left: Foot)  Patient location during evaluation: PACU Anesthesia Type: General Level of consciousness: awake and alert Pain management: pain level controlled Vital Signs Assessment: post-procedure vital signs reviewed and stable Respiratory status: spontaneous breathing, nonlabored ventilation, respiratory function stable and patient connected to nasal cannula oxygen Cardiovascular status: blood pressure returned to baseline and stable Postop Assessment: no apparent nausea or vomiting Anesthetic complications: no   No notable events documented.   Last Vitals:  Vitals:   02/24/24 0930 02/24/24 0943  BP: (!) 141/65 139/67  Pulse: 68 68  Resp: 12 16  Temp: (!) 36.3 C   SpO2: 99% 99%    Last Pain:  Vitals:   02/24/24 0930  TempSrc:   PainSc: 0-No pain                 Ashlye Oviedo C Cecilee Rosner

## 2024-02-24 NOTE — Discharge Instructions (Signed)
 [facility]  POST OPERATIVE INSTRUCTIONS FOR DR. ASHLEY AND DR. Zhoe [facility] PODIATRY DEPARTMENT   Take your medication as prescribed.  Pain medication should be taken only as needed.  Take antibiotics as prescribed until gone.  Restart blood thinner medications 24 hours after procedure.  Keep the dressing clean, dry and intact.  Remain nonweightbearing to left lower extremity at all times.  Please try to keep the boot on also at all times as will be acting as a cast or splint due to the calf muscle lengthening that was performed.  Keep your foot elevated above the heart level for the first 48 hours.  Continue elevation thereafter to improve swelling.  Walking to the bathroom and brief periods of walking are acceptable, unless we have instructed you to be non-weight bearing.  Always wear your post-op shoe when walking.  Always use your crutches if you are to be non-weight bearing.  Do not take a shower. Baths are permissible as long as the foot is kept out of the water.   Every hour you are awake:  Bend your knee 15 times. Massage calf 15 times  Call [facility] [phone]) if any of the following problems occur: You develop a temperature or fever. The bandage becomes saturated with blood. Medication does not stop your pain. Injury of the foot occurs. Any symptoms of infection including redness, odor, or red streaks running from wound.

## 2024-02-25 ENCOUNTER — Encounter: Payer: Self-pay | Admitting: Podiatry

## 2024-02-28 ENCOUNTER — Other Ambulatory Visit: Payer: Self-pay | Admitting: Family Medicine

## 2024-02-28 ENCOUNTER — Encounter: Admitting: Family Medicine

## 2024-02-28 DIAGNOSIS — I25810 Atherosclerosis of coronary artery bypass graft(s) without angina pectoris: Secondary | ICD-10-CM

## 2024-02-28 LAB — SURGICAL PATHOLOGY

## 2024-02-28 NOTE — Telephone Encounter (Signed)
 Copied from CRM [id number]. Topic: Clinical - Medication Refill >> Feb 28, 2024  8:54 AM Rosaria E wrote: Medication:  metoprolol  succinate (TOPROL -XL) 50 MG 24 hr tablet  Wants refills so he does not have to call, he takes this everyday.   Has the patient contacted their pharmacy? Yes (Agent: If no, request that the patient contact the pharmacy for the refill. If patient does not wish to contact the pharmacy document the reason why and proceed with request.) (Agent: If yes, when and what did the pharmacy advise?)  This is the patient's preferred pharmacy:  [facility] [address], [place] - [address] [place] [address] Phone: [phone] Fax: [phone]  Is this the correct pharmacy for this prescription? Yes If no, delete pharmacy and type the correct one.   Has the prescription been filled recently? Yes  Is the patient out of the medication? No  Has the patient been seen for an appointment in the last year OR does the patient have an upcoming appointment? Yes  Can we respond through MyChart? Yes  Agent: Please be advised that Rx refills may take up to 3 business days. We ask that you follow-up with your pharmacy.

## 2024-02-29 MED ORDER — METOPROLOL SUCCINATE ER 50 MG PO TB24: 50.0000 mg | ORAL_TABLET | Freq: Every day | ORAL | 0 refills | Status: DC

## 2024-02-29 NOTE — Telephone Encounter (Signed)
 Requested Prescriptions  Pending Prescriptions Disp Refills   metoprolol  succinate (TOPROL -XL) 50 MG 24 hr tablet 90 tablet 0    Sig: Take 1 tablet (50 mg total) by mouth daily. Take with or immediately following a meal.     Cardiovascular:  Beta Blockers Failed - 02/29/2024  1:45 PM      Failed - Valid encounter within last 6 months    Recent Outpatient Visits   None     Future Appointments             In 1 month [name], Ricardo SAILOR, DO [facility]            Passed - Last BP in normal range    BP Readings from Last 1 Encounters:  02/24/24 139/67         Passed - Last Heart Rate in normal range    Pulse Readings from Last 1 Encounters:  02/24/24 68

## 2024-03-01 ENCOUNTER — Ambulatory Visit (INDEPENDENT_AMBULATORY_CARE_PROVIDER_SITE_OTHER): Admitting: Nurse Practitioner

## 2024-03-01 ENCOUNTER — Encounter (INDEPENDENT_AMBULATORY_CARE_PROVIDER_SITE_OTHER)

## 2024-03-14 ENCOUNTER — Other Ambulatory Visit: Payer: Self-pay | Admitting: Family Medicine

## 2024-03-14 DIAGNOSIS — E119 Type 2 diabetes mellitus without complications: Secondary | ICD-10-CM

## 2024-03-14 NOTE — Telephone Encounter (Signed)
 Copied from CRM [id number]. Topic: Clinical - Medication Refill >> Mar 14, 2024  8:58 AM Turkey B wrote: Medication: gabapentin  (NEURONTIN ) 100 MG capsule  Has the patient contacted their pharmacy? No Patient thought he had to call in  This is the patient's preferred pharmacy:  [facility] [address] [place] - [address] [place] [address] Phone: [phone] Fax: [phone]   Is this the correct pharmacy for this prescription? yes If no, delete pharmacy and type the correct one.   Has the prescription been filled recently? no  Is the patient out of the medication? yes  Has the patient been seen for an appointment in the last year OR does the patient have an upcoming appointment? yes  Can we respond through MyChart? yes  Agent: Please be advised that Rx refills may take up to 3 business days. We ask that you follow-up with your pharmacy.

## 2024-03-16 MED ORDER — GABAPENTIN 100 MG PO CAPS: 100.0000 mg | ORAL_CAPSULE | Freq: Two times a day (BID) | ORAL | 0 refills | Status: AC

## 2024-03-16 NOTE — Telephone Encounter (Signed)
 Requested Prescriptions  Pending Prescriptions Disp Refills   gabapentin  (NEURONTIN ) 100 MG capsule 200 capsule 0    Sig: Take 1 capsule (100 mg total) by mouth 2 (two) times daily.     Neurology: Anticonvulsants - gabapentin  Failed - 03/16/2024  4:09 PM      Failed - Cr in normal range and within 360 days    Creat  Date Value Ref Range Status  09/30/2020 1.31 0.70 - 1.33 mg/dL Final    Comment:    For patients >63 years of age, the reference limit for Creatinine is approximately 13% higher for people identified as African-American. .    Creatinine, Ser  Date Value Ref Range Status  03/24/2023 1.51 (H) 0.61 - 1.24 mg/dL Final   Creatinine, Urine  Date Value Ref Range Status  01/07/2023 90  Final         Failed - Valid encounter within last 12 months    Recent Outpatient Visits   None     Future Appointments             In 2 weeks Pardue, Ricardo SAILOR, DO [place] [facility]            Passed - Completed PHQ-2 or PHQ-9 in the last 360 days

## 2024-03-23 ENCOUNTER — Other Ambulatory Visit (INDEPENDENT_AMBULATORY_CARE_PROVIDER_SITE_OTHER): Payer: Self-pay | Admitting: Nurse Practitioner

## 2024-03-23 DIAGNOSIS — Z9889 Other specified postprocedural states: Secondary | ICD-10-CM

## 2024-03-26 NOTE — Progress Notes (Signed)
 Foot and Ankle Surgery Patient Visit  Chief Complaint:   Chief Complaint  Patient presents with  . Follow-up    Recheck left foot ulcer.     Subjective  HPI  Ricardo Brown is a 63 y.o. male who presents for Follow-up (Recheck left foot ulcer. ) History of Present Illness Ricardo Brown is a 63 year old male who presents for follow-up after foot surgery.  He is status post 4 weeks left midfoot exostectomy and gastroc recession with wound debridement and graft application.  He has remained non-weightbearing with use of knee scooter and boot.  The wound is not draining much overall, and the dressing are being changed as instructed.  He has been keeping weight off the foot to prevent any setbacks in healing. His leg has not swollen since the surgery, which he attributes to staying off the foot and usage of compression dressings.  Patient Active Problem List  Diagnosis  . Hypertension  . HLD (hyperlipidemia)  . Diabetes mellitus type 2, uncomplicated (CMS/HHS-HCC)  . Coronary artery disease  . S/P CABG x 3 and MV repair on 6/27   . Acute postoperative pain  . Orthostasis  . AKI (acute kidney injury) ()  . Leukocytosis  . Postoperative anemia due to acute blood loss  . Postoperative atrial fibrillation (CMS/HHS-HCC)  . Right ventricular dysfunction  . Hyponatremia  . Type 2 diabetes mellitus with circulatory disorder, with long-term current use of insulin  (CMS/HHS-HCC)  . STEMI (ST elevation myocardial infarction) (CMS/HHS-HCC)  . PSVT (paroxysmal supraventricular tachycardia) (CMS/HHS-HCC)  . Neuropathy  . Imbalance  . Bilateral hand numbness  . Sleep difficulties  . Numbness  . Obesity (BMI 30-39.9), unspecified  . History of stroke  . MCI (mild cognitive impairment)  . Acquired absence of other left toe(s) (CMS/HHS-HCC)  . Chronic combined systolic (congestive) and diastolic (congestive) heart failure (CMS/HHS-HCC)  . Edema, unspecified  . Heart failure, unspecified  (CMS/HHS-HCC)  . History of colonic polyps  . Ischemic toe  . Lipoma of colon  . Osteomyelitis, unspecified (CMS/HHS-HCC)  . PAD (peripheral artery disease) ()  . Polyp of colon  . Restless leg syndrome  . Screening for colon cancer  . Stage 3b chronic kidney disease (CMS/HHS-HCC)    Allergies  Allergen Reactions  . Gabapentin  Palpitations    SVT  . Sacubitril-Valsartan Other (See Comments)    SVT  . Lipitor [Atorvastatin] Palpitations  . Requip [Ropinirole] Nausea and Vomiting  . Pregabalin Other (See Comments) and Palpitations    SVT    Social Drivers of Health   Tobacco Use: Low Risk  (03/14/2024)   Patient History   . Smoking Tobacco Use: Never   . Smokeless Tobacco Use: Never   . Passive Exposure: Never  Alcohol Use: Not At Risk (02/11/2024)   AUDIT-C   . Frequency of Alcohol Consumption: Monthly or less   . Average Number of Drinks: 1 or 2   . Frequency of Binge Drinking: Never  Financial Resource Strain: Medium Risk (07/20/2023)   Received from [facility]   Overall Financial Resource Strain (CARDIA)   . Difficulty of Paying Living Expenses: Somewhat hard  Food Insecurity: Food Insecurity Present (07/20/2023)   Received from [facility]   Hunger Vital Sign   . Within the past 12 months, you worried that your food would run out before you got the money to buy more.: Sometimes true   . Within the past 12 months, the food you bought just didn't last and you didn't  have money to get more.: Sometimes true  Transportation Needs: No Transportation Needs (07/20/2023)   Received from [facility] - Transportation   . Lack of Transportation (Medical): No   . Lack of Transportation (Non-Medical): No  Physical Activity: Insufficiently Active (07/20/2023)   Received from [facility]   Exercise Vital Sign   . On average, how many days per week do you engage in moderate to strenuous exercise (like a brisk walk)?: 3 days   . On average, how many minutes do you  engage in exercise at this level?: 30 min  Stress: No Stress Concern Present (07/20/2023)   Received from [facility] - Occupational Stress Questionnaire   . Feeling of Stress : Not at all  Social Connections: Moderately Isolated (07/20/2023)   Received from [facility]   Social Connection and Isolation Panel   . In a typical week, how many times do you talk on the phone with family, friends, or neighbors?: More than three times a week   . How often do you get together with friends or relatives?: Three times a week   . How often do you attend church or religious services?: Never   . Do you belong to any clubs or organizations such as church groups, unions, fraternal or athletic groups, or school groups?: No   . Attends Banker Meetings: Not on file   . Are you married, widowed, divorced, separated, never married, or living with a partner?: Married  Depression: Not on file  Housing Stability: High Risk (03/01/2024)   Housing Stability Vital Sign   . Unable to Pay for Housing in the Last Year: Yes   . Number of Times Moved in the Last Year: 0   . Homeless in the Last Year: No  Utilities: Not At Risk (06/28/2023)   [facility] Utilities   . Threatened with loss of utilities: No  Health Literacy: Not on file    Outpatient Medications Prior to Visit  Medication Sig Dispense Refill  . acetaminophen  (TYLENOL ) 500 MG tablet Take 500 mg by mouth every 6 (six) hours as needed    . ascorbic acid , vitamin C , (VITAMIN C ) 500 MG tablet Take 500 mg by mouth 2 (two) times daily    . aspirin  81 MG EC tablet Take 81 mg by mouth once daily    . blood-glucose sensor (FREESTYLE LIBRE 3 PLUS SENSOR) Devi Use 1 each every 15 (fifteen) days 6 each 3  . blood-glucose sensor (FREESTYLE LIBRE 3 SENSOR) Use 1 Device every 14 (fourteen) days 6 each 3  . clopidogreL  (PLAVIX ) 75 mg tablet Take 75 mg by mouth once daily    . dapagliflozin  propanediol (FARXIGA ) 10 mg  tablet Take 1 tablet (10 mg total) by mouth every morning before breakfast 90 tablet 3  . dulaglutide (TRULICITY) 1.5 mg/0.5 mL subcutaneous pen injector Inject 0.5 mLs (1.5 mg total) subcutaneously every 7 (seven) days 6 mL 3  . ferrous fum-vit C-vit B12-FA 460-60-0.01-1 mg Cap Take by mouth    . gabapentin  (NEURONTIN ) 100 MG capsule Take 1 capsule by mouth 2 (two) times daily    . HYDROcodone -acetaminophen  (NORCO) 5-325 mg tablet Take one tablet at night for pain; may take up to every 6 hours as needed for pain if not working or driving 20 tablet 0  . losartan  (COZAAR ) 25 MG tablet Take 1 tablet by mouth once daily    . metFORMIN  (GLUCOPHAGE ) 500 MG tablet  Take 2 tablets (1,000 mg total) by mouth 2 (two) times daily 360 tablet 3  . metoprolol  succinate (TOPROL -XL) 50 MG XL tablet Take 50 mg by mouth once daily       . omega 3-dha-epa-fish oil (FISH OIL) 100-160-1,000 mg Cap Take by mouth 2 (two) times daily    . PACERONE  200 mg tablet Take 200 mg by mouth once daily       . pramipexole  (MIRAPEX ) 1.5 MG tablet Take 1.5 mg by mouth daily after dinner    . rosuvastatin  (CRESTOR ) 20 MG tablet Take 20 mg by mouth once daily    . spironolactone  (ALDACTONE ) 25 MG tablet Take 12.5 mg by mouth once daily    . TORsemide  (DEMADEX ) 20 MG tablet Take by mouth 60 mg in am and 40 mg pm    . TRESIBA FLEXTOUCH U-200 pen injector (concentration 200 units/mL) Inject 36 Units subcutaneously at bedtime 30 mL 3  . VITAMIN B COMPLEX ORAL Take by mouth once daily     No facility-administered medications prior to visit.      Objective  Vitals:   03/24/24 1406  BP: 120/67  Weight: 95.3 kg (210 lb 1.6 oz)  Height: 172.7 cm (5' 8)  PainSc: 0-No pain  PainLoc: Foot     Body mass index is 31.95 kg/m.  Home Vitals:     Physical Exam Physical Exam  General/Constitutional: No apparent distress: well-nourished and well developed.   Psych: Normal mood and affect, oriented to person, place and time  (AOx3)   Vascular: DP/PT pulses palpable bilateral, capillary fill time intact to digits bilaterally, hair growth present to digits of bilateral lower extremities.   Neuro: Light touch sensation intact bilateral lower extremities.   Derm: Left foot and lower leg incisions appear to be well coapted, mild edema, minimal erythema. Wound measures L midfoot plantar approximately 0.6 x 0.5 x 0.1 cm, appears to be granular with some periwound maceration present mild hyperkeratotic buildup around the periwound.  Small area of dehiscence also present to the distal end of the incision line measures 0.3 x 0.2 x 0.2cm   MSK: Left transmetatarsal amputation  Results  Debridment of ulcer: Location: Left plantar midfoot Pre-debridement measurement: 0.6 x 0.5 x 0.1 cm Post-debridement measurement: See Tissue removed:   Hyperkeratotic tissue, biofilm Ulcer was debrided sharply with combination of tissue nippers and scalpel blade into the Subcutaneous Tissue   Debridment of ulcer: Location:  Left distal midfoot dorsal incision site Pre-debridement measurement: 0 Post-debridement measurement: 0.3 x 0.2 x 0.2cm Tissue removed:   HPK tissue, fibrous tissue Ulcer was debrided sharply with combination of tissue nippers and scalpel blade into the Subcutaneous Tissue         Assessment/Plan:    Assessment & Plan Post-surgical management of non-pressure chronic heel and midfoot ulcer Graft intact with drainage. Incision site well coapted with no signs of infection present to the foot or lower leg, mild dehiscence at the distal incision site. Bone planing reduced prominence and pressure. X-ray shows heel bone drop, reducing ulcer pressure. No bone infection. Pressure hindering healing. -Wound debridements performed as described above, patient tolerated procedure well. - Applied bacitracin to the wounds followed by 4 x 4 gauze, ABD, Kerlix, Ace wrap.  May weight-bear with heel contact but should still  otherwise be nonweightbearing to the left lower extremity if need to go long distances, still recommend usage of knee scooter. - Recommended dressing changes every other day.  Patient may get the foot wet  and is to apply dressings as instructed today and as described above. - Monitor for signs of infection or increased drainage.  Diagnoses and all orders for this visit:  Postop check  Ulcer of left foot with fat layer exposed (CMS/HHS-HCC)  S/P transmetatarsal amputation of foot, left (CMS/HHS-HCC)  Status post amputation of toe of right foot (CMS/HHS-HCC)  Type 2 diabetes mellitus with polyneuropathy (CMS/HHS-HCC)  PVD (peripheral vascular disease) ()  Amputated toe of right foot ()    Return in about 1 week (around 03/31/2024) for foot ulcer L recheck.  An after visit summary was provided for the patient either in written format (printed) or through MyChart if needed/necessary .  This note has been created using automated tools and reviewed for accuracy by ANDREW MICHAEL BAKER.  Translation done by AI may create words close to the sound of words used at visit, but errors could be present.  Note was reviewed.

## 2024-03-29 ENCOUNTER — Ambulatory Visit (INDEPENDENT_AMBULATORY_CARE_PROVIDER_SITE_OTHER)

## 2024-03-29 ENCOUNTER — Encounter (INDEPENDENT_AMBULATORY_CARE_PROVIDER_SITE_OTHER): Payer: Self-pay | Admitting: Nurse Practitioner

## 2024-03-29 ENCOUNTER — Ambulatory Visit (INDEPENDENT_AMBULATORY_CARE_PROVIDER_SITE_OTHER): Admitting: Nurse Practitioner

## 2024-03-29 VITALS — BP 122/73 | HR 69 | Ht 67.0 in | Wt 211.6 lb

## 2024-03-29 DIAGNOSIS — I739 Peripheral vascular disease, unspecified: Secondary | ICD-10-CM

## 2024-03-29 DIAGNOSIS — E1159 Type 2 diabetes mellitus with other circulatory complications: Secondary | ICD-10-CM | POA: Diagnosis not present

## 2024-03-29 DIAGNOSIS — Z794 Long term (current) use of insulin: Secondary | ICD-10-CM

## 2024-03-29 DIAGNOSIS — Z9889 Other specified postprocedural states: Secondary | ICD-10-CM

## 2024-03-29 DIAGNOSIS — E1122 Type 2 diabetes mellitus with diabetic chronic kidney disease: Secondary | ICD-10-CM

## 2024-03-29 DIAGNOSIS — I152 Hypertension secondary to endocrine disorders: Secondary | ICD-10-CM

## 2024-03-29 DIAGNOSIS — N1832 Chronic kidney disease, stage 3b: Secondary | ICD-10-CM

## 2024-03-31 ENCOUNTER — Encounter: Payer: Self-pay | Admitting: Family Medicine

## 2024-03-31 ENCOUNTER — Ambulatory Visit: Admitting: Family Medicine

## 2024-03-31 VITALS — BP 131/66 | HR 69 | Ht 67.0 in | Wt 208.0 lb

## 2024-03-31 DIAGNOSIS — Z794 Long term (current) use of insulin: Secondary | ICD-10-CM

## 2024-03-31 DIAGNOSIS — I25810 Atherosclerosis of coronary artery bypass graft(s) without angina pectoris: Secondary | ICD-10-CM

## 2024-03-31 DIAGNOSIS — M7022 Olecranon bursitis, left elbow: Secondary | ICD-10-CM

## 2024-03-31 DIAGNOSIS — I471 Supraventricular tachycardia, unspecified: Secondary | ICD-10-CM

## 2024-03-31 DIAGNOSIS — Z0000 Encounter for general adult medical examination without abnormal findings: Secondary | ICD-10-CM | POA: Diagnosis not present

## 2024-03-31 DIAGNOSIS — R21 Rash and other nonspecific skin eruption: Secondary | ICD-10-CM | POA: Diagnosis not present

## 2024-03-31 DIAGNOSIS — Z79899 Other long term (current) drug therapy: Secondary | ICD-10-CM | POA: Diagnosis not present

## 2024-03-31 DIAGNOSIS — E1122 Type 2 diabetes mellitus with diabetic chronic kidney disease: Secondary | ICD-10-CM | POA: Diagnosis not present

## 2024-03-31 DIAGNOSIS — I5032 Chronic diastolic (congestive) heart failure: Secondary | ICD-10-CM

## 2024-03-31 DIAGNOSIS — N1832 Chronic kidney disease, stage 3b: Secondary | ICD-10-CM

## 2024-03-31 MED ORDER — KETOCONAZOLE 2 % EX CREA: TOPICAL_CREAM | Freq: Two times a day (BID) | CUTANEOUS | 0 refills | Status: AC

## 2024-03-31 MED ORDER — METOPROLOL SUCCINATE ER 50 MG PO TB24: 50.0000 mg | ORAL_TABLET | Freq: Every day | ORAL | 2 refills | Status: AC

## 2024-03-31 NOTE — Progress Notes (Signed)
 Annual Wellness Visit     Patient: Ricardo Brown, Male    DOB: Oct 19, 1960, 63 y.o.   MRN: [id number] Visit Date: 03/31/2024  Today's Provider: LAURAINE LOISE BUOY, DO   Chief Complaint  Patient presents with   Annual Exam   Care Management    Pattern of eating:general   Are you exercising: No    Vaccine: denied all vaccine   Foot Exam: can't had foot surgery       Subjective    Ricardo Brown is a 63 y.o. male who presents today for his Annual Wellness Visit.  HPI Ricardo Brown is a 63 year old male who presents for a Medicare annual wellness visit.  He underwent foot surgery approximately six months ago due to a diabetic foot infection that led to osteomyelitis and required the amputation of all toes on one foot. Post-surgery, he experienced bone regrowth at the amputation site, causing pressure and necessitating further surgical intervention to shave the bone and release tension in the calf muscle. He has been attending regular post-operative check-ups and is currently using a boot and knee scooter for mobility. No pain in the calves is reported.  He has a history of supraventricular tachycardia, which is well-controlled with amiodarone . No recent episodes of tachycardia have occurred since resuming the medication. He also has a history of heart failure and attends a heart failure clinic occasionally. He sees a cardiologist once a year, as his condition is stable.  He is on multiple medications and endorses taking them consistently. His current medications include Trulicity, metformin , Farxiga , and amiodarone . He also takes a B12 complex due to metformin  use.  He has diabetes and is monitored by endocrinology, with an upcoming appointment next month. He uses a Libre 3 for glucose monitoring. He has regular foot exams due to his diabetic condition and has been managing a chronic ulcer on the bottom of his foot since last year through  podiatry.  He has a rash on his right leg, applying plain lotion without improvement. He is unsure of the exact duration it has been present.  No issues with mobility, dressing, bathing, or concentration. He wears reading glasses and sees an ophthalmologist for retinal checks due to diabetes. No difficulty with hearing.   From record: Echocardiogram 09/30/2022 showed ejection fraction 45 to 50% with left ventricular global hypokinesis.  S/p midfoot exostectomy and gastroc recession with wound debridement and graft application on 02/24/2024     Medications: Outpatient Medications Prior to Visit  Medication Sig   amiodarone  (PACERONE ) 200 MG tablet Take 1 tablet (200 mg total) by mouth daily.   ascorbic acid  (VITAMIN C ) 500 MG tablet Take 1 tablet (500 mg total) by mouth 2 (two) times daily.   aspirin  81 MG EC tablet Take 81 mg by mouth daily.    b complex vitamins tablet Take 1 tablet by mouth daily.    clopidogrel  (PLAVIX ) 75 MG tablet Take 1 tablet by mouth once daily   Continuous Glucose Sensor (FREESTYLE LIBRE 3 SENSOR) MISC 1 each by Does not apply route every 14 (fourteen) days. Place 1 sensor on the skin every 14 days. Use to check glucose continuously   dapagliflozin  propanediol (FARXIGA ) 10 MG TABS tablet Take 1 tablet (10 mg total) by mouth daily before breakfast.   Dulaglutide (TRULICITY) 1.5 MG/0.5ML SOPN Inject 1.5 mg into the skin once a week.   gabapentin  (NEURONTIN ) 100 MG capsule Take 1 capsule (100 mg  total) by mouth 2 (two) times daily.   insulin  degludec (TRESIBA) 200 UNIT/ML FlexTouch Pen 32 Units at bedtime.   losartan  (COZAAR ) 25 MG tablet Take 1 tablet by mouth once daily   metFORMIN  (GLUCOPHAGE ) 500 MG tablet Take 2 tablets (1,000 mg total) by mouth 2 (two) times daily.   Omega-3 Fatty Acids (FISH OIL) 875 MG CAPS Take 875 mg by mouth 2 (two) times daily.   pramipexole  (MIRAPEX ) 1.5 MG tablet Take 1 tablet (1.5 mg total) by mouth daily.   rosuvastatin  (CRESTOR )  20 MG tablet Take 1 tablet (20 mg total) by mouth daily.   torsemide  (DEMADEX ) 20 MG tablet TAKE 3 TABLETS BY MOUTH IN THE MORNING AND 2 IN THE EVENING   [DISCONTINUED] amoxicillin -clavulanate (AUGMENTIN ) 875-125 MG tablet Take 1 tablet by mouth 2 (two) times daily. (Patient not taking: Reported on 03/31/2024)   [DISCONTINUED] metoprolol  succinate (TOPROL -XL) 50 MG 24 hr tablet Take 1 tablet (50 mg total) by mouth daily. Take with or immediately following a meal.   [DISCONTINUED] spironolactone  (ALDACTONE ) 25 MG tablet Take 1/2 (one-half) tablet by mouth once daily   No facility-administered medications prior to visit.    Allergies  Allergen Reactions   Lipitor [Atorvastatin] Palpitations    SVT   Sacubitril-Valsartan Other (See Comments)    SVT   Pregabalin Palpitations    SVT    Patient Care Team: Donzella Lauraine SAILOR, DO as PCP - General (Family Medicine) Darliss Rogue, MD as PCP - Cardiology (Cardiology) Cindie Ole DASEN, MD as PCP - Electrophysiology (Cardiology) Dominica Brandy, MD as Consulting Physician (Nephrology) Damian, Therisa HERO, MD as Physician Assistant (Endocrinology) Therisa Bi, MD as Consulting Physician (Gastroenterology) Laurita Cassis, MD as Referring Physician (Ophthalmology) Mandy Fleeta RAMAN, DO (Optometry) Bensimhon, Toribio SAUNDERS, MD as Consulting Physician (Cardiology)        Objective    Vitals: BP 131/66 (BP Location: Right Arm, Patient Position: Sitting, Cuff Size: Normal)   Pulse 69   Ht 5' 7 (1.702 m)   Wt 208 lb (94.3 kg)   SpO2 100%   BMI 32.58 kg/m     Physical Exam Vitals reviewed.  Constitutional:      General: He is not in acute distress.    Appearance: Normal appearance. He is not diaphoretic.  HENT:     Head: Normocephalic and atraumatic.  Eyes:     General: No scleral icterus.    Conjunctiva/sclera: Conjunctivae normal.  Cardiovascular:     Rate and Rhythm: Normal rate and regular rhythm.     Pulses: Normal pulses.     Heart  sounds: Normal heart sounds. No murmur heard. Pulmonary:     Effort: Pulmonary effort is normal. No respiratory distress.     Breath sounds: Normal breath sounds. No wheezing or rhonchi.  Musculoskeletal:        General: Swelling (Swelling of the left elbow noted.) present.     Cervical back: Neck supple.     Right lower leg: No edema.     Left lower leg: No edema.  Lymphadenopathy:     Cervical: No cervical adenopathy.  Skin:    General: Skin is warm and dry.     Findings: Bruising (Scattered ecchymoses noted) and rash (medial right leg (picture on chart)) present.  Neurological:     Mental Status: He is alert and oriented to person, place, and time. Mental status is at baseline.  Psychiatric:        Mood and Affect: Mood normal.  Behavior: Behavior normal.     Most recent functional status assessment:    03/31/2024   10:08 AM  In your present state of health, do you have any difficulty performing the following activities:  Hearing? 0  Vision? 0  Difficulty concentrating or making decisions? 0  Walking or climbing stairs? 0  Dressing or bathing? 0  Doing errands, shopping? 0   Most recent fall risk assessment:    03/31/2024   10:06 AM  Fall Risk   Falls in the past year? 0  Number falls in past yr: 0  Injury with Fall? 0  Risk for fall due to : No Fall Risks    Most recent depression screenings:    03/31/2024   10:22 AM 07/20/2023    8:27 AM  PHQ 2/9 Scores  PHQ - 2 Score 0 0  PHQ- 9 Score 2 4   Most recent cognitive screening:    03/31/2024   10:06 AM  6CIT Screen  What Year? 0 points  What month? 0 points  What time? 0 points  Count back from 20 0 points  Months in reverse 0 points  Repeat phrase 0 points  Total Score 0 points   Most recent Audit-C alcohol use screening    03/31/2024    9:40 AM  Alcohol Use Disorder Test (AUDIT)  1. How often do you have a drink containing alcohol? 2  2. How many drinks containing alcohol do you have on a  typical day when you are drinking? 0  3. How often do you have six or more drinks on one occasion? 0  AUDIT-C Score 2   A score of 3 or more in women, and 4 or more in men indicates increased risk for alcohol abuse, EXCEPT if all of the points are from question 1   Results for orders placed or performed in visit on 03/31/24  Urine Albumin/Creatinine with ratio (send out) [LAB689]  Result Value Ref Range   Creatinine, Urine 37.3 Not Estab. mg/dL   Microalbumin, Urine 7.9 Not Estab. ug/mL   Microalb/Creat Ratio 21 0 - 29 mg/g creat  Comprehensive metabolic panel with GFR  Result Value Ref Range   Glucose 107 (H) 70 - 99 mg/dL   BUN 38 (H) 8 - 27 mg/dL   Creatinine, Ser 7.93 (H) 0.76 - 1.27 mg/dL   eGFR 36 (L) >40 fO/fpw/8.26   BUN/Creatinine Ratio 18 10 - 24   Sodium 139 134 - 144 mmol/L   Potassium 5.0 3.5 - 5.2 mmol/L   Chloride 97 96 - 106 mmol/L   CO2 23 20 - 29 mmol/L   Calcium  9.2 8.6 - 10.2 mg/dL   Total Protein 7.2 6.0 - 8.5 g/dL   Albumin 4.6 3.9 - 4.9 g/dL   Globulin, Total 2.6 1.5 - 4.5 g/dL   Bilirubin Total 0.3 0.0 - 1.2 mg/dL   Alkaline Phosphatase 71 44 - 121 IU/L   AST 18 0 - 40 IU/L   ALT 18 0 - 44 IU/L  Hemoglobin A1c  Result Value Ref Range   Hgb A1c MFr Bld 7.1 (H) 4.8 - 5.6 %   Est. average glucose Bld gHb Est-mCnc 157 mg/dL  Lipid panel  Result Value Ref Range   Cholesterol, Total 143 100 - 199 mg/dL   Triglycerides 801 (H) 0 - 149 mg/dL   HDL 32 (L) >60 mg/dL   VLDL Cholesterol Cal 34 5 - 40 mg/dL   LDL Chol Calc (NIH) 77 0 - 99  mg/dL   Chol/HDL Ratio 4.5 0.0 - 5.0 ratio  Vitamin B12  Result Value Ref Range   Vitamin B-12 392 232 - 1,245 pg/mL    Assessment & Plan     Annual wellness visit done today including the all of the following: Reviewed patient's Family Medical History Reviewed and updated list of patient's medical providers Assessment of cognitive impairment was done Assessed patient's functional ability Established a written  schedule for health screening services Health Risk Assessent Completed and Reviewed Patient is not currently on any opioid medications.  Exercise Activities and Dietary recommendations  Goals   None      There is no immunization history on file for this patient.  Health Maintenance  Topic Date Due   Zoster Vaccines- Shingrix (1 of 2) 03/30/2025 (Originally 07/24/1980)   DTaP/Tdap/Td (1 - Tdap) 03/31/2025 (Originally 07/24/1980)   Pneumococcal Vaccine: 50+ Years (1 of 2 - PCV) 03/31/2025 (Originally 07/24/1980)   OPHTHALMOLOGY EXAM  07/22/2024   HEMOGLOBIN A1C  09/30/2024   Diabetic kidney evaluation - eGFR measurement  03/31/2025   Diabetic kidney evaluation - Urine ACR  03/31/2025   FOOT EXAM  03/31/2025   Medicare Annual Wellness (AWV)  03/31/2025   Colonoscopy  09/06/2028   Hepatitis C Screening  Completed   HIV Screening  Completed   Hepatitis B Vaccines 19-59 Average Risk  Aged Out   HPV VACCINES  Aged Out   Meningococcal B Vaccine  Aged Out   Influenza Vaccine  Discontinued   COVID-19 Vaccine  Discontinued     Discussed health benefits of physical activity, and encouraged him to engage in regular exercise appropriate for his age and condition.    Medicare annual wellness visit, subsequent  Rash -     ketoconazole 2%-triamcinolone  0.1% 1:2 cream mixture; Apply topically 2 (two) times daily.  Dispense: 45 g; Refill: 0  Type 2 diabetes mellitus with stage 3b chronic kidney disease, with long-term current use of insulin  (HCC) -     Microalbumin / creatinine urine ratio -     Comprehensive metabolic panel with GFR -     Hemoglobin A1c -     Lipid panel -     Vitamin B12  Paroxysmal supraventricular tachycardia (HCC) Assessment & Plan: Managed with amiodarone ; also on metoprolol  succinate.   High risk medication use -     Vitamin B12  Olecranon bursitis of left elbow -     Ambulatory referral to Orthopedic Surgery  Coronary artery disease involving coronary  bypass graft of native heart without angina pectoris -     Metoprolol  Succinate ER; Take 1 tablet (50 mg total) by mouth daily. Take with or immediately following a meal.  Dispense: 90 tablet; Refill: 2  Heart failure with improved ejection fraction (HFimpEF) [facility])     Adult Wellness Visit Medicare annual wellness visit completed. Discussed shingles vaccine risks and efficacy. Patient declined vaccination, including shingles, Tdap and pneumonia.  He will be due for colonoscopy in 2030.  His diabetic eye exam is up-to-date.  Chronic right leg rash Chronic rash on right leg, possible dermatitis or fungal infection. - Prescribe steroid cream and antifungal treatment.  Type 2 diabetes mellitus with diabetic neuropathy, chronic kidney disease, and chronic left foot ulcer post-amputation Type 2 diabetes with complications including neuropathy, chronic kidney disease, and left foot ulcer post-amputation. Recent foot surgery for osteomyelitis. Reduced foot sensation. Managed by specialists. - Order B12 level due to metformin  use. - Continue follow-up with endocrinology, nephrology, and podiatry.  Heart failure Heart failure stable on medications. No recent exacerbations. - Continue losartan , metoprolol , torsemide , spironolactone , rosuvastatin  and Farxiga .  No changes. - Continue follow-up with cardiology and heart failure clinic.  Defer to specialist management  Paroxysmal supraventricular tachycardia, controlled on medication Supraventricular tachycardia controlled on amiodarone . Patient previously advised against ablation due to risks. Prefers medication. - Continue amiodarone  and metoprolol . - Follow-up with cardiology as needed.  Left elbow bursitis/fluid accumulation Fluid accumulation in left elbow, likely from pressure. No prior orthopedic evaluation. - Refer to orthopedics for evaluation and potential drainage.    Return in about 6 months (around 09/30/2024) for Chronic f/u.      I discussed the assessment and treatment plan with the patient  The patient was provided an opportunity to ask questions and all were answered. The patient agreed with the plan and demonstrated an understanding of the instructions.   The patient was advised to call back or seek an in-person evaluation if the symptoms worsen or if the condition fails to improve as anticipated.    LAURAINE LOISE BUOY, DO  [facility] [phone] (phone) [phone] (fax)  [facility]

## 2024-03-31 NOTE — Assessment & Plan Note (Signed)
 Managed with amiodarone ; also on metoprolol  succinate.

## 2024-04-01 LAB — LIPID PANEL
Chol/HDL Ratio: 4.5 ratio (ref 0.0–5.0)
Cholesterol, Total: 143 mg/dL (ref 100–199)
HDL: 32 mg/dL — ABNORMAL LOW (ref >39)
LDL Chol Calc (NIH): 77 mg/dL (ref 0–99)
Triglycerides: 198 mg/dL — ABNORMAL HIGH (ref 0–149)
VLDL Cholesterol Cal: 34 mg/dL (ref 5–40)

## 2024-04-01 LAB — COMPREHENSIVE METABOLIC PANEL WITH GFR
ALT: 18 IU/L (ref 0–44)
AST: 18 IU/L (ref 0–40)
Albumin: 4.6 g/dL (ref 3.9–4.9)
Alkaline Phosphatase: 71 IU/L (ref 44–121)
BUN/Creatinine Ratio: 18 (ref 10–24)
BUN: 38 mg/dL — ABNORMAL HIGH (ref 8–27)
Bilirubin Total: 0.3 mg/dL (ref 0.0–1.2)
CO2: 23 mmol/L (ref 20–29)
Calcium: 9.2 mg/dL (ref 8.6–10.2)
Chloride: 97 mmol/L (ref 96–106)
Creatinine, Ser: 2.06 mg/dL — ABNORMAL HIGH (ref 0.76–1.27)
Globulin, Total: 2.6 g/dL (ref 1.5–4.5)
Glucose: 107 mg/dL — ABNORMAL HIGH (ref 70–99)
Potassium: 5 mmol/L (ref 3.5–5.2)
Sodium: 139 mmol/L (ref 134–144)
Total Protein: 7.2 g/dL (ref 6.0–8.5)
eGFR: 36 mL/min/1.73 — ABNORMAL LOW (ref >59)

## 2024-04-01 LAB — MICROALBUMIN / CREATININE URINE RATIO
Creatinine, Urine: 37.3 mg/dL
Microalb/Creat Ratio: 21 mg/g{creat} (ref 0–29)
Microalbumin, Urine: 7.9 ug/mL

## 2024-04-01 LAB — HEMOGLOBIN A1C
Est. average glucose Bld gHb Est-mCnc: 157 mg/dL
Hgb A1c MFr Bld: 7.1 % — ABNORMAL HIGH (ref 4.8–5.6)

## 2024-04-01 LAB — VITAMIN B12: Vitamin B-12: 392 pg/mL (ref 232–1245)

## 2024-04-02 ENCOUNTER — Other Ambulatory Visit: Payer: Self-pay | Admitting: Internal Medicine

## 2024-04-03 ENCOUNTER — Encounter (INDEPENDENT_AMBULATORY_CARE_PROVIDER_SITE_OTHER): Payer: Self-pay | Admitting: Nurse Practitioner

## 2024-04-03 NOTE — Progress Notes (Signed)
 Subjective:    Patient ID: Ricardo Brown, male    DOB: [birth year], 63 y.o.   MRN: [id number] Chief Complaint  Patient presents with   Follow-up    The patient returns to the office for followup regarding atherosclerotic changes of the lower extremities and review of the noninvasive studies.   There have been no interval changes in lower extremity symptoms. No interval shortening of the patient's claudication distance or development of rest pain symptoms. No new ulcers or wounds have occurred since the last visit.  The previous wound on his foot is continuing to heal per notes from podiatry the patient also notes that there has been significant improvement.  There have been no significant changes to the patient's overall health care.  The patient denies amaurosis fugax or recent TIA symptoms. There are no documented recent neurological changes noted. There is no history of DVT, PE or superficial thrombophlebitis. The patient denies recent episodes of angina or shortness of breath.   ABI Rt=1.20 and Lt=1.01  (previous ABI's Rt=1.06 and Lt=1.03) Duplex ultrasound of the lower extremity shows triphasic waveforms throughout the left lower extremity    Review of Systems  Skin:  Positive for wound.  All other systems reviewed and are negative.      Objective:   Physical Exam Vitals reviewed.  HENT:     Head: Normocephalic.  Cardiovascular:     Rate and Rhythm: Normal rate.     Pulses:          Dorsalis pedis pulses are detected w/ Doppler on the right side and detected w/ Doppler on the left side.       Posterior tibial pulses are detected w/ Doppler on the right side and detected w/ Doppler on the left side.  Pulmonary:     Effort: Pulmonary effort is normal.  Skin:    General: Skin is warm and dry.  Neurological:     Mental Status: He is alert and oriented to person, place, and time.  Psychiatric:        Mood and Affect: Mood normal.        Behavior: Behavior  normal.        Thought Content: Thought content normal.     BP 122/73   Pulse 69   Ht 5' 7 (1.702 m)   Wt 211 lb 9.6 oz (96 kg)   BMI 33.14 kg/m   Past Medical History:  Diagnosis Date   Atrial fibrillation (HCC)    Chronic combined systolic (congestive) and diastolic (congestive) heart failure (HCC)    a. 01/2018 EF 35-40%; b. 10/2020 Echo: EF 40-45%, glob HK. RVSP 42.9mmHg. Mildly dil LA. Mild MS/AoV sclerosis.   CKD (chronic kidney disease), stage Brown (HCC)    Coronary artery disease    a. 01/2018 late presenting inferior MI; b. 01/2018 Cath: Severe multivessel dzs-->CABG x 3 (LIMA->LAD, VG->OM2, VG->RPDA @ [facility]).   Diabetes mellitus without complication (HCC)    a. Dx ~ 2000.   Heart palpitations    a. Pt reports prior nl echo's and stress tests. Last stress test w/in past 2 yrs - PCP.   High cholesterol    History of Bell's palsy    History of mitral valve replacement with bioprosthetic valve    Hypertension    Mitral regurgitation    a. 01/2018 s/p MV repair @ time of CABG.   Myocardial infarction (HCC)    Neuropathy    legs and hands   Postoperative atrial fibrillation  a. 01/2018 @ time of CABG.   PSVT (paroxysmal supraventricular tachycardia) (HCC)    a. Very symptomatic with multiple ED evaluations.  Has been on amio.   Severe sepsis with acute organ dysfunction (HCC) 12/19/2021   Sleep apnea    mild - has CPAP, doesn't use   Sleep difficulties 09/07/2019   STEMI (ST elevation myocardial infarction) (HCC) 01/24/2018   Wears dentures    full upper and lower    Social History   Socioeconomic History   Marital status: Married    Spouse name: Not on file   Number of children: Not on file   Years of education: Not on file   Highest education level: 12th grade  Occupational History   Not on file  Tobacco Use   Smoking status: Never   Smokeless tobacco: Never  Vaping Use   Vaping status: Never Used  Substance and Sexual Activity   Alcohol use: Yes     Comment: rare beer   Drug use: No   Sexual activity: Not on file  Other Topics Concern   Not on file  Social History Narrative   Lives locally with wife.  Works in [employer].  Does not routinely exercise.   Social Drivers of Corporate investment banker Strain: Low Risk  (03/31/2024)   Overall Financial Resource Strain (CARDIA)    Difficulty of Paying Living Expenses: Not hard at all  Food Insecurity: No Food Insecurity (03/31/2024)   Hunger Vital Sign    Worried About Running Out of Food in the Last Year: Never true    Ran Out of Food in the Last Year: Never true  Transportation Needs: No Transportation Needs (03/31/2024)   PRAPARE - Administrator, Civil Service (Medical): No    Lack of Transportation (Non-Medical): No  Physical Activity: Insufficiently Active (07/20/2023)   Exercise Vital Sign    Days of Exercise per Week: 3 days    Minutes of Exercise per Session: 30 min  Stress: No Stress Concern Present (03/31/2024)   [employer] of Occupational Health - Occupational Stress Questionnaire    Feeling of Stress: Not at all  Social Connections: Moderately Isolated (07/20/2023)   Social Connection and Isolation Panel    Frequency of Communication with Friends and Family: More than three times a week    Frequency of Social Gatherings with Friends and Family: Three times a week    Attends Religious Services: Never    Active Member of Clubs or Organizations: No    Attends Banker Meetings: Not on file    Marital Status: Married  Intimate Partner Violence: Not At Risk (03/31/2024)   Humiliation, Afraid, Rape, and Kick questionnaire    Fear of Current or Ex-Partner: No    Emotionally Abused: No    Physically Abused: No    Sexually Abused: No    Past Surgical History:  Procedure Laterality Date   AMPUTATION TOE Right 08/29/2021   Procedure: AMPUTATION TOE-2nd toe;  Surgeon: Neill Boas, DPM;  Location: [facility];  Service: Podiatry;  Laterality:  Right;   AMPUTATION TOE Left 12/22/2021   Procedure: AMPUTATION TOE-2nd Toe Partial;  Surgeon: Lennie Barter, DPM;  Location: [facility];  Service: Podiatry;  Laterality: Left;   CARDIAC CATHETERIZATION     CARDIAC VALVE REPLACEMENT     Mitral Valve Repair   CATARACT EXTRACTION W/PHACO Left 10/08/2022   Procedure: CATARACT EXTRACTION PHACO AND INTRAOCULAR LENS PLACEMENT (IOC) LEFT DIABETIC OMIDRIA 20.94 01:35.9;  Surgeon: Enola,  Feliciano Hugger, MD;  Location: [facility];  Service: Ophthalmology;  Laterality: Left;  Diabetic   COLONOSCOPY WITH PROPOFOL  N/A 06/07/2019   Procedure: COLONOSCOPY WITH PROPOFOL ;  Surgeon: Janalyn Keene NOVAK, MD;  Location: [facility];  Service: Endoscopy;  Laterality: N/A;   COLONOSCOPY WITH PROPOFOL  N/A 02/07/2020   Procedure: COLONOSCOPY WITH PROPOFOL ;  Surgeon: Janalyn Keene NOVAK, MD;  Location: [facility];  Service: Endoscopy;  Laterality: N/A;   COLONOSCOPY WITH PROPOFOL  N/A 12/11/2020   Procedure: COLONOSCOPY WITH PROPOFOL ;  Surgeon: Janalyn Keene NOVAK, MD;  Location: [facility];  Service: Endoscopy;  Laterality: N/A;   COLONOSCOPY WITH PROPOFOL  N/A 07/12/2023   Procedure: COLONOSCOPY WITH PROPOFOL ;  Surgeon: Therisa Bi, MD;  Location: [facility] END[facility]COPY;  Service: Gastroenterology;  Laterality: N/A;   COLONOSCOPY WITH PROPOFOL  N/A 09/07/2023   Procedure: COLONOSCOPY WITH PROPOFOL ;  Surgeon: Therisa Bi, MD;  Location: [facility] ENDOSCOPY;  Service: Gastroenterology;  Laterality: N/A;   CORONARY ANGIOPLASTY     CORONARY ARTERY BYPASS GRAFT     x 3   01/2018   ESOPHAGOGASTRODUODENOSCOPY N/A 02/07/2020   Procedure: ESOPHAGOGASTRODUODENOSCOPY (EGD);  Surgeon: Janalyn Keene NOVAK, MD;  Location: [facility] ENDOSCOPY;  Service: Endoscopy;  Laterality: N/A;   GASTROC RECESSION EXTREMITY Left 02/24/2024   Procedure: RECESSION, TENDON, GASTROCNEMIUS;  Surgeon: Lennie Barter, DPM;  Location: [facility];  Service: Orthopedics/Podiatry;  Laterality: Left;    HEMOSTASIS CLIP PLACEMENT  07/12/2023   Procedure: HEMOSTASIS CLIP PLACEMENT;  Surgeon: Therisa Bi, MD;  Location: [facility] ENDOSCOPY;  Service: Gastroenterology;;   INCISION AND DRAINAGE Left 06/05/2022   Procedure: INCISION AND DRAINAGE;  Surgeon: Lennie Barter, DPM;  Location: [facility];  Service: Podiatry;  Laterality: Left;   LEFT HEART CATH AND CORONARY ANGIOGRAPHY N/A 01/24/2018   Procedure: LEFT HEART CATH AND CORONARY ANGIOGRAPHY;  Surgeon: Darron Deatrice LABOR, MD;  Location: [facility];  Service: Cardiovascular;  Laterality: N/A;   LOWER EXTREMITY ANGIOGRAPHY Left 06/08/2022   Procedure: Lower Extremity Angiography;  Surgeon: Marea Selinda RAMAN, MD;  Location: [facility];  Service: Cardiovascular;  Laterality: Left;   LOWER EXTREMITY INTERVENTION Right 03/24/2023   Procedure: LOWER EXTREMITY INTERVENTION;  Surgeon: Marea Selinda RAMAN, MD;  Location: [facility];  Service: Cardiovascular;  Laterality: Right;   METATARSAL HEAD EXCISION Left 02/24/2024   Procedure: EXCISION, METATARSAL BONE, HEAD;  Surgeon: Lennie Barter, DPM;  Location: [facility];  Service: Orthopedics/Podiatry;  Laterality: Left;   POLYPECTOMY  07/12/2023   Procedure: POLYPECTOMY;  Surgeon: Therisa Bi, MD;  Location: [facility] ENDOSCOPY;  Service: Gastroenterology;;   POLYPECTOMY  09/07/2023   Procedure: POLYPECTOMY;  Surgeon: Therisa Bi, MD;  Location: [facility] ENDOSCOPY;  Service: Gastroenterology;;   SVT ABLATION N/A 09/12/2021   Procedure: SVT ABLATION;  Surgeon: Cindie Ole DASEN, MD;  Location: [facility] INVASIVE CV LAB;  Service: Cardiovascular;  Laterality: N/A;   TONSILLECTOMY     TRANSMETATARSAL AMPUTATION Left 08/04/2022   Procedure: TRANSMETATARSAL AMPUTATION;  Surgeon: Neill Boas, DPM;  Location: [facility];  Service: Podiatry;  Laterality: Left;   WOUND DEBRIDEMENT Left 02/24/2024   Procedure: DEBRIDEMENT, WOUND;  Surgeon: Lennie Barter, DPM;  Location: [facility];  Service: Orthopedics/Podiatry;   Laterality: Left;    Family History  Problem Relation Age of Onset   Cancer Mother        died @ 79   CAD Father        First MI @ 67. S/p heart transplant. Died in mid-50's of cancer.   Cancer Father  Other Brother        alive and well    Allergies  Allergen Reactions   Lipitor [Atorvastatin] Palpitations    SVT   Sacubitril-Valsartan Other (See Comments)    SVT   Pregabalin Palpitations    SVT       Latest Ref Rng & Units 03/24/2023    4:55 AM 03/23/2023    5:17 AM 03/22/2023   10:42 AM  CBC  WBC 4.0 - 10.5 K/uL 6.7  7.7  10.4   Hemoglobin 13.0 - 17.0 g/dL 88.5  89.2  87.5   Hematocrit 39.0 - 52.0 % 33.7  31.9  38.0   Platelets 150 - 400 K/uL 188  175  207       CMP     Component Value Date/Time   NA 139 03/31/2024 1147   K 5.0 03/31/2024 1147   CL 97 03/31/2024 1147   CO2 23 03/31/2024 1147   GLUCOSE 107 (H) 03/31/2024 1147   GLUCOSE 159 (H) 03/24/2023 0455   BUN 38 (H) 03/31/2024 1147   CREATININE 2.06 (H) 03/31/2024 1147   CREATININE 1.31 09/30/2020 1114   CALCIUM  9.2 03/31/2024 1147   PROT 7.2 03/31/2024 1147   ALBUMIN 4.6 03/31/2024 1147   AST 18 03/31/2024 1147   ALT 18 03/31/2024 1147   ALKPHOS 71 03/31/2024 1147   BILITOT 0.3 03/31/2024 1147   EGFR 36 (L) 03/31/2024 1147   GFRNONAA 52 (L) 03/24/2023 0455   GFRNONAA 59 (L) 09/30/2020 1114     No results found.     Assessment & Plan:   1. Peripheral arterial disease with history of revascularization (HCC) (Primary)  Recommend:  The patient has evidence of atherosclerosis of the lower extremities with no claudication.  The patient does not voice lifestyle limiting changes at this point in time.  Noninvasive studies do not suggest clinically significant change.  No invasive studies, angiography or surgery at this time The patient should continue walking and begin a more formal exercise program.  The patient should continue antiplatelet therapy and aggressive treatment of the lipid  abnormalities  No changes in the patient's medications at this time  Continued surveillance is indicated as atherosclerosis is likely to progress with time.    The patient will continue follow up with noninvasive studies as ordered.   2. Type 2 diabetes mellitus with stage 3b chronic kidney disease, with long-term current use of insulin  (HCC) Continue hypoglycemic medications as already ordered, these medications have been reviewed and there are no changes at this time.  Hgb A1C to be monitored as already arranged by primary serviceContinue hypoglycemic medications as already ordered, these medications have been reviewed and there are no changes at this time.  Hgb A1C to be monitored as already arranged by primary service  3. Hypertension associated with diabetes (HCC) Continue antihypertensive medications as already ordered, these medications have been reviewed and there are no changes at this time.   Current Outpatient Medications on File Prior to Visit  Medication Sig Dispense Refill   amiodarone  (PACERONE ) 200 MG tablet Take 1 tablet (200 mg total) by mouth daily. 90 tablet 2   amoxicillin -clavulanate (AUGMENTIN ) 875-125 MG tablet Take 1 tablet by mouth 2 (two) times daily. (Patient not taking: Reported on 03/31/2024) 14 tablet 0   ascorbic acid  (VITAMIN C ) 500 MG tablet Take 1 tablet (500 mg total) by mouth 2 (two) times daily. 60 tablet 3   aspirin  81 MG EC tablet Take 81 mg by mouth daily.  b complex vitamins tablet Take 1 tablet by mouth daily.      clopidogrel  (PLAVIX ) 75 MG tablet Take 1 tablet by mouth once daily 90 tablet 0   Continuous Glucose Sensor (FREESTYLE LIBRE 3 SENSOR) MISC 1 each by Does not apply route every 14 (fourteen) days. Place 1 sensor on the skin every 14 days. Use to check glucose continuously 2 each 11   dapagliflozin  propanediol (FARXIGA ) 10 MG TABS tablet Take 1 tablet (10 mg total) by mouth daily before breakfast. 30 tablet 0   Dulaglutide  (TRULICITY) 1.5 MG/0.5ML SOPN Inject 1.5 mg into the skin once a week.     gabapentin  (NEURONTIN ) 100 MG capsule Take 1 capsule (100 mg total) by mouth 2 (two) times daily. 200 capsule 0   insulin  degludec (TRESIBA) 200 UNIT/ML FlexTouch Pen 32 Units at bedtime.     losartan  (COZAAR ) 25 MG tablet Take 1 tablet by mouth once daily 90 tablet 0   metFORMIN  (GLUCOPHAGE ) 500 MG tablet Take 2 tablets (1,000 mg total) by mouth 2 (two) times daily.     Omega-3 Fatty Acids (FISH OIL) 875 MG CAPS Take 875 mg by mouth 2 (two) times daily.     pramipexole  (MIRAPEX ) 1.5 MG tablet Take 1 tablet (1.5 mg total) by mouth daily.     rosuvastatin  (CRESTOR ) 20 MG tablet Take 1 tablet (20 mg total) by mouth daily. 90 tablet 3   spironolactone  (ALDACTONE ) 25 MG tablet Take 1/2 (one-half) tablet by mouth once daily 45 tablet 0   torsemide  (DEMADEX ) 20 MG tablet TAKE 3 TABLETS BY MOUTH IN THE MORNING AND 2 IN THE EVENING 150 tablet 5   No current facility-administered medications on file prior to visit.    There are no Patient Instructions on file for this visit. No follow-ups on file.   Cerra Eisenhower E Nayquan Evinger, NP

## 2024-04-04 LAB — VAS US ABI WITH/WO TBI
Left ABI: 1.01
Right ABI: 1.2

## 2024-04-05 ENCOUNTER — Other Ambulatory Visit: Payer: Self-pay

## 2024-04-05 MED ORDER — SPIRONOLACTONE 25 MG PO TABS: 12.5000 mg | ORAL_TABLET | Freq: Every day | ORAL | 3 refills | Status: AC

## 2024-04-06 ENCOUNTER — Ambulatory Visit: Payer: Self-pay | Admitting: Family Medicine

## 2024-04-06 DIAGNOSIS — I5032 Chronic diastolic (congestive) heart failure: Secondary | ICD-10-CM | POA: Insufficient documentation

## 2024-04-17 ENCOUNTER — Other Ambulatory Visit: Payer: Self-pay

## 2024-04-17 DIAGNOSIS — M25522 Pain in left elbow: Secondary | ICD-10-CM

## 2024-04-18 ENCOUNTER — Ambulatory Visit: Admission: RE | Admit: 2024-04-18 | Discharge: 2024-04-18 | Disposition: A | Discharge status: Home / Self Care

## 2024-04-18 ENCOUNTER — Ambulatory Visit
Admission: RE | Admit: 2024-04-18 | Discharge: 2024-04-18 | Disposition: A | Discharge status: Home / Self Care | Source: Ambulatory Visit

## 2024-04-18 DIAGNOSIS — M25522 Pain in left elbow: Secondary | ICD-10-CM

## 2024-04-19 ENCOUNTER — Ambulatory Visit (INDEPENDENT_AMBULATORY_CARE_PROVIDER_SITE_OTHER)

## 2024-04-19 DIAGNOSIS — M7022 Olecranon bursitis, left elbow: Secondary | ICD-10-CM

## 2024-04-19 NOTE — Progress Notes (Signed)
 Orthopaedic Surgery New Patient Visit   History of Present Illness: The patient is a 63 y.o. male seen in clinic for left elbow swelling.  He reports no known injury.  He noticed some swelling over the tip of his elbow roughly 2 months ago.  He reports it is not bothersome to him.  He has pain-free range of motion.  Denies fever, pain, increased warmth over the area.  Rates pain a 0/10.  Denies other associated symptoms including drainage, numbness.  Patient has never had this presentation in the past.  He has not attempted any treatment measures as it is not interfering with his daily life.  He does report that he likes to build a lot of puzzles and leans on his elbows.  He was referred to our office at the request of his primary care doctor.  Of note, patient is diabetic and does have chronic kidney disease.  Patient is on disability and does not work.     Past Medical, Social and Family History: Past Medical History:  Diagnosis Date   Atrial fibrillation (HCC)    Chronic combined systolic (congestive) and diastolic (congestive) heart failure (HCC)    a. 01/2018 EF 35-40%; b. 10/2020 Echo: EF 40-45%, glob HK. RVSP 42.40mmHg. Mildly dil LA. Mild MS/AoV sclerosis.   CKD (chronic kidney disease), stage III (HCC)    Coronary artery disease    a. 01/2018 late presenting inferior MI; b. 01/2018 Cath: Severe multivessel dzs-->CABG x 3 (LIMA->LAD, VG->OM2, VG->RPDA @ [facility]).   Diabetes mellitus without complication (HCC)    a. Dx ~ 2000.   Heart palpitations    a. Pt reports prior nl echo's and stress tests. Last stress test w/in past 2 yrs - PCP.   High cholesterol    History of Bell's palsy    History of mitral valve replacement with bioprosthetic valve    Hypertension    Mitral regurgitation    a. 01/2018 s/p MV repair @ time of CABG.   Myocardial infarction [facility])    Neuropathy    legs and hands   Postoperative atrial fibrillation    a. 01/2018 @ time of CABG.   PSVT (paroxysmal  supraventricular tachycardia) (HCC)    a. Very symptomatic with multiple ED evaluations.  Has been on amio.   Severe sepsis with acute organ dysfunction (HCC) 12/19/2021   Sleep apnea    mild - has CPAP, doesn't use   Sleep difficulties 09/07/2019   STEMI (ST elevation myocardial infarction) (HCC) 01/24/2018   Wears dentures    full upper and lower   Past Surgical History:  Procedure Laterality Date   AMPUTATION TOE Right 08/29/2021   Procedure: AMPUTATION TOE-2nd toe;  Surgeon: Neill Boas, DPM;  Location: [facility] ORS;  Service: Podiatry;  Laterality: Right;   AMPUTATION TOE Left 12/22/2021   Procedure: AMPUTATION TOE-2nd Toe Partial;  Surgeon: Lennie Barter, DPM;  Location: [facility] ORS;  Service: Podiatry;  Laterality: Left;   CARDIAC CATHETERIZATION     CARDIAC VALVE REPLACEMENT     Mitral Valve Repair   CATARACT EXTRACTION W/PHACO Left 10/08/2022   Procedure: CATARACT EXTRACTION PHACO AND INTRAOCULAR LENS PLACEMENT (IOC) LEFT DIABETIC OMIDRIA 20.94 01:35.9;  Surgeon: Enola Feliciano Hugger, MD;  Location: [facility];  Service: Ophthalmology;  Laterality: Left;  Diabetic   COLONOSCOPY WITH PROPOFOL  N/A 06/07/2019   Procedure: COLONOSCOPY WITH PROPOFOL ;  Surgeon: Janalyn Keene NOVAK, MD;  Location: [facility] ENDOSCOPY;  Service: Endoscopy;  Laterality: N/A;   COLONOSCOPY WITH PROPOFOL  N/A 02/07/2020  Procedure: COLONOSCOPY WITH PROPOFOL ;  Surgeon: Janalyn Keene NOVAK, MD;  Location: [facility] ENDOSCOPY;  Service: Endoscopy;  Laterality: N/A;   COLONOSCOPY WITH PROPOFOL  N/A 12/11/2020   Procedure: COLONOSCOPY WITH PROPOFOL ;  Surgeon: Janalyn Keene NOVAK, MD;  Location: [facility] ENDOSCOPY;  Service: Endoscopy;  Laterality: N/A;   COLONOSCOPY WITH PROPOFOL  N/A 07/12/2023   Procedure: COLONOSCOPY WITH PROPOFOL ;  Surgeon: Therisa Bi, MD;  Location: [facility] ENDOSCOPY;  Service: Gastroenterology;  Laterality: N/A;   COLONOSCOPY WITH PROPOFOL  N/A 09/07/2023   Procedure: COLONOSCOPY WITH PROPOFOL ;  Surgeon:  Therisa Bi, MD;  Location: [facility] ENDOSCOPY;  Service: Gastroenterology;  Laterality: N/A;   CORONARY ANGIOPLASTY     CORONARY ARTERY BYPASS GRAFT     x 3   01/2018   ESOPHAGOGASTRODUODENOSCOPY N/A 02/07/2020   Procedure: ESOPHAGOGASTRODUODENOSCOPY (EGD);  Surgeon: Janalyn Keene NOVAK, MD;  Location: [facility] ENDOSCOPY;  Service: Endoscopy;  Laterality: N/A;   GASTROC RECESSION EXTREMITY Left 02/24/2024   Procedure: RECESSION, TENDON, GASTROCNEMIUS;  Surgeon: Lennie Barter, DPM;  Location: [facility];  Service: Orthopedics/Podiatry;  Laterality: Left;   HEMOSTASIS CLIP PLACEMENT  07/12/2023   Procedure: HEMOSTASIS CLIP PLACEMENT;  Surgeon: Therisa Bi, MD;  Location: [facility] ENDOSCOPY;  Service: Gastroenterology;;   INCISION AND DRAINAGE Left 06/05/2022   Procedure: INCISION AND DRAINAGE;  Surgeon: Lennie Barter, DPM;  Location: [facility] OR[facility];  Service: Podiatry;  Laterality: Left;   LEFT HEART CATH AND CORONARY ANGIOGRAPHY N/A 01/24/2018   Procedure: LEFT HEART CATH AND CORONARY ANGIOGRAPHY;  Surgeon: Darron Deatrice LABOR, MD;  Location: [facility] INVASIVE CV LAB;  Service: Cardiovascular;  Laterality: N/A;   LOWER EXTREMITY ANGIOGRAPHY Left 06/08/2022   Procedure: Lower Extremity Angiography;  Surgeon: Marea Selinda RAMAN, MD;  Location: [facility] INVASIVE CV LAB;  Service: Cardiovascular;  Laterality: Left;   LOWER EXTREMITY INTERVENTION Right 03/24/2023   Procedure: LOWER EXTREMITY INTERVENTION;  Surgeon: Marea Selinda RAMAN, MD;  Location: [facility] INV[facility] CV LAB;  Service: Cardiovascular;  Laterality: Right;   METATARSAL HEAD EXCISION Left 02/24/2024   Procedure: EXCISION, METATARSAL BONE, HEAD;  Surgeon: Lennie Barter, DPM;  Location: [facility]GERY [facility];  Service: Orthopedics/Podiatry;  Laterality: Left;   POLYPECTOMY  07/12/2023   Procedure: POLYPECTOMY;  Surgeon: Therisa Bi, MD;  Location: [facility] ENDOSCOPY;  Service: Gastroenterology;;   POLYPECTOMY  09/07/2023   Procedure: POLYPECTOMY;  Surgeon: Therisa Bi, MD;  Location: [facility]  ENDOSCOPY;  Service: Gastroenterology;;   SVT ABLATION N/A 09/12/2021   Procedure: SVT ABLATION;  Surgeon: Cindie Ole DASEN, MD;  Location: [facility] INVASIVE CV LAB;  Service: Cardiovascular;  Laterality: N/A;   TONSILLECTOMY     TRANSMETATARSAL AMPUTATION Left 08/04/2022   Procedure: TRANSMETATARSAL AMPUTATION;  Surgeon: Neill Boas, DPM;  Location: [facility] ORS;  Service: Podiatry;  Laterality: Left;   WOUND DEBRIDEMENT Left 02/24/2024   Procedure: DEBRIDEMENT, WOUND;  Surgeon: Lennie Barter, DPM;  Location: [facility] SURGERY [facility];  Service: Orthopedics/Podiatry;  Laterality: Left;   Allergies  Allergen Reactions   Lipitor [Atorvastatin] Palpitations    SVT   Sacubitril-Valsartan Other (See Comments)    SVT   Pregabalin Palpitations    SVT   Current Outpatient Medications on File Prior to Visit  Medication Sig Dispense Refill   amiodarone  (PACERONE ) 200 MG tablet Take 1 tablet (200 mg total) by mouth daily. 90 tablet 2   ascorbic acid  (VITAMIN C ) 500 MG tablet Take 1 tablet (500 mg total) by mouth 2 (two) times daily. 60 tablet 3   aspirin  81 MG EC tablet Take 81 mg by mouth daily.  b complex vitamins tablet Take 1 tablet by mouth daily.      clopidogrel  (PLAVIX ) 75 MG tablet Take 1 tablet by mouth once daily 90 tablet 0   Continuous Glucose Sensor (FREESTYLE LIBRE 3 SENSOR) MISC 1 each by Does not apply route every 14 (fourteen) days. Place 1 sensor on the skin every 14 days. Use to check glucose continuously 2 each 11   dapagliflozin  propanediol (FARXIGA ) 10 MG TABS tablet Take 1 tablet (10 mg total) by mouth daily before breakfast. 30 tablet 0   Dulaglutide (TRULICITY) 1.5 MG/0.5ML SOPN Inject 1.5 mg into the skin once a week.     gabapentin  (NEURONTIN ) 100 MG capsule Take 1 capsule (100 mg total) by mouth 2 (two) times daily. 200 capsule 0   insulin  degludec (TRESIBA) 200 UNIT/ML FlexTouch Pen 32 Units at bedtime.     ketoconazole 2%-triamcinolone  0.1% 1:2 cream mixture Apply topically 2  (two) times daily. 45 g 0   losartan  (COZAAR ) 25 MG tablet Take 1 tablet by mouth once daily 90 tablet 0   metFORMIN  (GLUCOPHAGE ) 500 MG tablet Take 2 tablets (1,000 mg total) by mouth 2 (two) times daily.     metoprolol  succinate (TOPROL -XL) 50 MG 24 hr tablet Take 1 tablet (50 mg total) by mouth daily. Take with or immediately following a meal. 90 tablet 2   Omega-3 Fatty Acids (FISH OIL) 875 MG CAPS Take 875 mg by mouth 2 (two) times daily.     pramipexole  (MIRAPEX ) 1.5 MG tablet Take 1 tablet (1.5 mg total) by mouth daily.     rosuvastatin  (CRESTOR ) 20 MG tablet Take 1 tablet (20 mg total) by mouth daily. 90 tablet 3   spironolactone  (ALDACTONE ) 25 MG tablet Take 0.5 tablets (12.5 mg total) by mouth daily. 45 tablet 3   torsemide  (DEMADEX ) 20 MG tablet TAKE 3 TABLETS BY MOUTH IN THE MORNING AND 2 IN THE EVENING 150 tablet 5   No current facility-administered medications on file prior to visit.   Social History   Tobacco Use   Smoking status: Never   Smokeless tobacco: Never  Vaping Use   Vaping status: Never Used  Substance Use Topics   Alcohol use: Yes    Comment: rare beer   Drug use: No      I have reviewed past medical, surgical, social and family history, medications and allergies as documented in the EMR.  Review of Systems - A ROS was performed including pertinent positives and negatives as documented in the HPI.     Physical Exam:  General/Constitutional: NAD Vascular: No edema, swelling or tenderness, except as noted in detailed exam Integumentary: No impressive skin lesions present, except as noted in detailed exam Neuro/Psych: Normal mood and affect, oriented to person, place and time Musculoskeletal: Normal, except as noted in detailed exam and in HPI   Focused examination:  Left Elbow Examination (focused): There is no swelling, ecchymosis, erythema, or warmth noted of the left elbow.  The elbow is without rash, lesion, excoriation.  Normal definition of  the surrounding musculature.  Normal carrying angle.There is a slight fluid accumulation within the olecranon bursa. This is non-tender.  Continuous glucose monitor noted over the proximal left arm.  Tenderness: Over the Medial epicondyle: Negative Over the Lateral epicondyle: Negative Over the Olecranon: Negative Over the Radial head: Negative Patient has pain free flexion to 130 and extension to 0.  Pronation Full (0-90).  Supination full (0-90).      The patient had 2+ radial  pulse. The hand and arm were well perfused.  Compartments were soft.  Strength is 5/5 when resisting all ranges of motion.  Sensation is intact to light touch in median,ulnar, and radial distributions.          XR Elbow Imaging: X-rays of the Left elbow including 4 views (AP, lnternal oblique, external oblique, lateral) obtained at [facility] were reviewed personally by me.  Per my independent interpretation these images show no fracture or dislocation.  Joint spaces well-maintained.  Subtle expansion of the skin window overlying the olecranon.   Assessment:  Left elbow aseptic olecranon bursitis  Plan:  Patient was seen and examined in office today. We reviewed patient's history, examination, and imaging in detail. Based on information available for this encounter, patient found to have left elbow aseptic olecranon bursitis.  Patient denies symptoms of fever, pain, redness.  This condition does not bother patient.  We discussed conservative treatment measures today including avoidance of leaning on the elbows, ice therapy, compression as needed.  Patient is unable to take anti-inflammatories due to his underlying medical comorbidities therefore this was not a feasible option.  Educated patient if he were to develop fever associated with increased redness and pain overlying the elbow that he is to notify the office for further evaluation.  Patient may follow-up as needed.  All questions, concerns and  comments were addressed to the best of my ability.  Follow-up: As needed   Arlyss GEANNIE Schneider, DO Orthopedic Surgery & Sports Medicine [place]   This document was dictated using Dragon voice recognition software. A reasonable attempt at proof reading has been made to minimize errors.

## 2024-05-11 ENCOUNTER — Other Ambulatory Visit: Payer: Self-pay | Admitting: Cardiology

## 2024-05-28 ENCOUNTER — Other Ambulatory Visit (INDEPENDENT_AMBULATORY_CARE_PROVIDER_SITE_OTHER): Payer: Self-pay | Admitting: Nurse Practitioner

## 2024-05-29 ENCOUNTER — Encounter: Attending: Physician Assistant | Admitting: Physician Assistant

## 2024-05-29 DIAGNOSIS — L97522 Non-pressure chronic ulcer of other part of left foot with fat layer exposed: Secondary | ICD-10-CM | POA: Insufficient documentation

## 2024-05-29 DIAGNOSIS — I480 Paroxysmal atrial fibrillation: Secondary | ICD-10-CM | POA: Diagnosis not present

## 2024-05-29 DIAGNOSIS — E11621 Type 2 diabetes mellitus with foot ulcer: Secondary | ICD-10-CM | POA: Diagnosis present

## 2024-05-29 DIAGNOSIS — N183 Chronic kidney disease, stage 3 unspecified: Secondary | ICD-10-CM | POA: Insufficient documentation

## 2024-05-29 DIAGNOSIS — I129 Hypertensive chronic kidney disease with stage 1 through stage 4 chronic kidney disease, or unspecified chronic kidney disease: Secondary | ICD-10-CM | POA: Insufficient documentation

## 2024-05-29 DIAGNOSIS — E1143 Type 2 diabetes mellitus with diabetic autonomic (poly)neuropathy: Secondary | ICD-10-CM | POA: Insufficient documentation

## 2024-05-29 DIAGNOSIS — I2510 Atherosclerotic heart disease of native coronary artery without angina pectoris: Secondary | ICD-10-CM | POA: Diagnosis not present

## 2024-05-29 DIAGNOSIS — E1122 Type 2 diabetes mellitus with diabetic chronic kidney disease: Secondary | ICD-10-CM | POA: Diagnosis not present

## 2024-05-29 NOTE — Telephone Encounter (Signed)
 Refills approved   Dois Seip CMA

## 2024-06-01 ENCOUNTER — Encounter

## 2024-06-01 ENCOUNTER — Encounter: Admitting: Physician Assistant

## 2024-06-01 DIAGNOSIS — E11621 Type 2 diabetes mellitus with foot ulcer: Secondary | ICD-10-CM | POA: Diagnosis not present

## 2024-06-05 ENCOUNTER — Encounter: Payer: Self-pay | Admitting: Radiology

## 2024-06-05 ENCOUNTER — Encounter: Attending: Physician Assistant | Admitting: Physician Assistant

## 2024-06-05 DIAGNOSIS — I129 Hypertensive chronic kidney disease with stage 1 through stage 4 chronic kidney disease, or unspecified chronic kidney disease: Secondary | ICD-10-CM | POA: Diagnosis not present

## 2024-06-05 DIAGNOSIS — L97522 Non-pressure chronic ulcer of other part of left foot with fat layer exposed: Secondary | ICD-10-CM | POA: Diagnosis not present

## 2024-06-05 DIAGNOSIS — I2510 Atherosclerotic heart disease of native coronary artery without angina pectoris: Secondary | ICD-10-CM | POA: Insufficient documentation

## 2024-06-05 DIAGNOSIS — I480 Paroxysmal atrial fibrillation: Secondary | ICD-10-CM | POA: Insufficient documentation

## 2024-06-05 DIAGNOSIS — E1143 Type 2 diabetes mellitus with diabetic autonomic (poly)neuropathy: Secondary | ICD-10-CM | POA: Diagnosis not present

## 2024-06-05 DIAGNOSIS — N183 Chronic kidney disease, stage 3 unspecified: Secondary | ICD-10-CM | POA: Insufficient documentation

## 2024-06-05 DIAGNOSIS — E11621 Type 2 diabetes mellitus with foot ulcer: Secondary | ICD-10-CM | POA: Insufficient documentation

## 2024-06-05 DIAGNOSIS — E1122 Type 2 diabetes mellitus with diabetic chronic kidney disease: Secondary | ICD-10-CM | POA: Diagnosis not present

## 2024-06-07 ENCOUNTER — Encounter: Admitting: Physician Assistant

## 2024-06-07 DIAGNOSIS — E11621 Type 2 diabetes mellitus with foot ulcer: Secondary | ICD-10-CM | POA: Diagnosis not present

## 2024-06-12 ENCOUNTER — Encounter: Admitting: Physician Assistant

## 2024-06-12 ENCOUNTER — Telehealth: Payer: Self-pay | Admitting: Family

## 2024-06-12 DIAGNOSIS — E11621 Type 2 diabetes mellitus with foot ulcer: Secondary | ICD-10-CM | POA: Diagnosis not present

## 2024-06-12 NOTE — Telephone Encounter (Signed)
 Called to confirm/remind patient of their appointment [facility] on 06/13/24.   Appointment:   [x] Confirmed  [] Left mess   [] No answer/No voice mail  [] VM Full/unable to leave message  [] Phone not in service  Patient reminded to bring all medications and/or complete list.  Confirmed patient has transportation. Gave directions, instructed to utilize valet parking.

## 2024-06-12 NOTE — Progress Notes (Unsigned)
 Advanced Heart Failure Clinic Note    PCP: Emilio Marseille, NP (last seen 12/24) Primary Cardiologist: Darliss Rogue, MD HF provider: Rolan Barrack, MD   Chief Complaint: shortness of breath    HPI:  Mr Brogden is a 63 y/o male with a history of CKD, DM, hyperlipidemia, atrial tachycardia/ SVT, mitral valve repair with CABG 06/19, CAD s/p CABG 06/19, atrial fibrillation (post CABG), PAD, OSA and chronic heart failure..   Patient had a delayed presentation inferior MI 06/19. Cath showed totally occluded RCA, 90% mLAD, 90% pLCx.  s/p CABG x 3 with MV repair,  LIMA-LAD, SVG-OM2, SVG-PDA at [facility]. He has an ischemic cardiomyopathy, most recent echo in 2/24 showed EF 45-50%, normal RV, s/p MV repair with no MR and mean gradient 4 mmHg, IVC normal. He has PAD with below-the-knee PTCAs on left 11/23, then left TMA 01/24.  He had atrial fibrillation only noted at the time of CABG (post-op) and is not anticoagulated.    Admitted 03/22/23 due to concern for diabetic foot infection. No osteomyelitis on MRI, wound debrided by podiatry. On 03/24/23 angiography w/ balloon angioplasties R anterior tibia;, right tibioperoneal trunk and proximal posterior tibial artery. Initially given IV antibiotics. Per podiatry - No plan for surgical debridement or amputation at this time.      Echo 09/26/19: EF 40-45% with moderate LVH, mild LAE with prosthetic mitral valve Echo 10/29/20: EF 40-45% with mild LVH, mildly elevated PA pressure of 42.9 mmHg with repaired mitral valve with mean gradient of 7.0 mmHg Echo 12/17/21: EF 45-50% with Grade II DD with repaired mitral valve with gradient of 3.5 mmHg Echo 09/30/22: EF 45-50% with mitral gradient of 4.0 mmHg  LHC 01/24/18: Prox RCA to Mid RCA lesion is 20% stenosed. Dist RCA lesion is 100% stenosed. Mid Cx to Dist Cx lesion is 90% stenosed. Prox LAD lesion is 60% stenosed. Mid LAD lesion is 90% stenosed. Ost 2nd Diag to 2nd Diag lesion is 85% stenosed. Mid LM to  Dist LM lesion is 20% stenosed. Prox Cx lesion is 90% stenosed.  1.  Late presenting inferior ST elevation myocardial infarction of at least 48 hours duration.  The culprit is occluded distal right coronary artery with left to right collaterals noted especially to the right PDA.  In addition, there is diffuse diabetic three-vessel coronary artery disease affecting the proximal, mid and distal LAD as well as proximal and distal left circumflex. 2.  Moderately to severely elevated left ventricular end-diastolic pressure with an LVEDP of 29 to 30 mmHg.  Left ventricular angiography was not performed given renal failure.  He presents today for a HF f/u visit with a chief complaint of minimal shortness of breath. Has associated minimal fatigue, occasional dizziness with sudden position changes, bilateral lower extremity edema. Sleeping well in recliner due to comfort and has been in recliner ever since his bypass. Currently in a walking boot due to an unhealed wound on the bottom of his left foot. He is currently going to the wound center and says that the wound is almost healed.   Admits to drinking a lot of fluids but he's unable to quantify the amount. Doesn't drink much water but does drink diet soda and unsweetened tea. Says that at meal time he can drink 3-4 large glasses of fluid.   Was unable to tolerate entresto in the past as he developed SVT with it.   ROS: All systems negative except what is listed in HPI, PMH and Problem List   Past  Medical History:  Diagnosis Date   Atrial fibrillation (HCC)    Chronic combined systolic (congestive) and diastolic (congestive) heart failure (HCC)    a. 01/2018 EF 35-40%; b. 10/2020 Echo: EF 40-45%, glob HK. RVSP 42.60mmHg. Mildly dil LA. Mild MS/AoV sclerosis.   CKD (chronic kidney disease), stage III (HCC)    Coronary artery disease    a. 01/2018 late presenting inferior MI; b. 01/2018 Cath: Severe multivessel dzs-->CABG x 3 (LIMA->LAD, VG->OM2, VG->RPDA  @ [facility]).   Diabetes mellitus without complication (HCC)    a. Dx ~ 2000.   Heart palpitations    a. Pt reports prior nl echo's and stress tests. Last stress test w/in past 2 yrs - PCP.   High cholesterol    History of Bell's palsy    History of mitral valve replacement with bioprosthetic valve    Hypertension    Mitral regurgitation    a. 01/2018 s/p MV repair @ time of CABG.   Myocardial infarction [facility])    Neuropathy    legs and hands   Postoperative atrial fibrillation    a. 01/2018 @ time of CABG.   PSVT (paroxysmal supraventricular tachycardia)    a. Very symptomatic with multiple ED evaluations.  Has been on amio.   Severe sepsis with acute organ dysfunction (HCC) 12/19/2021   Sleep apnea    mild - has CPAP, doesn't use   Sleep difficulties 09/07/2019   STEMI (ST elevation myocardial infarction) (HCC) 01/24/2018   Wears dentures    full upper and lower    Current Outpatient Medications  Medication Sig Dispense Refill   amiodarone  (PACERONE ) 200 MG tablet Take 1 tablet by mouth once daily 90 tablet 0   ascorbic acid  (VITAMIN C ) 500 MG tablet Take 1 tablet (500 mg total) by mouth 2 (two) times daily. 60 tablet 3   aspirin  81 MG EC tablet Take 81 mg by mouth daily.      b complex vitamins tablet Take 1 tablet by mouth daily.      clopidogrel  (PLAVIX ) 75 MG tablet Take 1 tablet by mouth once daily 90 tablet 0   Continuous Glucose Sensor (FREESTYLE LIBRE 3 SENSOR) MISC 1 each by Does not apply route every 14 (fourteen) days. Place 1 sensor on the skin every 14 days. Use to check glucose continuously 2 each 11   dapagliflozin  propanediol (FARXIGA ) 10 MG TABS tablet Take 1 tablet (10 mg total) by mouth daily before breakfast. 30 tablet 0   Dulaglutide (TRULICITY) 1.5 MG/0.5ML SOPN Inject 1.5 mg into the skin once a week.     gabapentin  (NEURONTIN ) 100 MG capsule Take 1 capsule (100 mg total) by mouth 2 (two) times daily. 200 capsule 0   insulin  degludec (TRESIBA) 200 UNIT/ML  FlexTouch Pen 32 Units at bedtime.     ketoconazole 2%-triamcinolone  0.1% 1:2 cream mixture Apply topically 2 (two) times daily. 45 g 0   losartan  (COZAAR ) 25 MG tablet Take 1 tablet by mouth once daily 90 tablet 0   metFORMIN  (GLUCOPHAGE ) 500 MG tablet Take 2 tablets (1,000 mg total) by mouth 2 (two) times daily.     metoprolol  succinate (TOPROL -XL) 50 MG 24 hr tablet Take 1 tablet (50 mg total) by mouth daily. Take with or immediately following a meal. 90 tablet 2   Omega-3 Fatty Acids (FISH OIL) 875 MG CAPS Take 875 mg by mouth 2 (two) times daily.     pramipexole  (MIRAPEX ) 1.5 MG tablet Take 1 tablet (1.5 mg total) by mouth  daily.     rosuvastatin  (CRESTOR ) 20 MG tablet Take 1 tablet (20 mg total) by mouth daily. 90 tablet 3   spironolactone  (ALDACTONE ) 25 MG tablet Take 0.5 tablets (12.5 mg total) by mouth daily. 45 tablet 3   torsemide  (DEMADEX ) 20 MG tablet TAKE 3 TABLETS BY MOUTH IN THE MORNING AND 2 IN THE EVENING 150 tablet 5   No current facility-administered medications for this visit.    Allergies  Allergen Reactions   Lipitor [Atorvastatin] Palpitations    SVT   Sacubitril-Valsartan Other (See Comments)    SVT   Pregabalin Palpitations    SVT      Social History   Socioeconomic History   Marital status: Married    Spouse name: Not on file   Number of children: Not on file   Years of education: Not on file   Highest education level: 12th grade  Occupational History   Not on file  Tobacco Use   Smoking status: Never   Smokeless tobacco: Never  Vaping Use   Vaping status: Never Used  Substance and Sexual Activity   Alcohol use: Yes    Comment: rare beer   Drug use: No   Sexual activity: Not on file  Other Topics Concern   Not on file  Social History Narrative   Lives locally with wife.  Works in [employer].  Does not routinely exercise.   Social Drivers of Corporate Investment Banker Strain: Low Risk  (05/04/2024)   Received from [facility]   Overall Financial Resource Strain (CARDIA)    Difficulty of Paying Living Expenses: Not hard at all  Food Insecurity: No Food Insecurity (05/04/2024)   Received from [facility]   Hunger Vital Sign    Within the past 12 months, you worried that your food would run out before you got the money to buy more.: Never true    Within the past 12 months, the food you bought just didn't last and you didn't have money to get more.: Never true  Transportation Needs: No Transportation Needs (05/04/2024)   Received from [facility] - Transportation    In the past 12 months, has lack of transportation kept you from medical appointments or from getting medications?: No    Lack of Transportation (Non-Medical): No  Physical Activity: Insufficiently Active (07/20/2023)   Exercise Vital Sign    Days of Exercise per Week: 3 days    Minutes of Exercise per Session: 30 min  Stress: No Stress Concern Present (03/31/2024)   [employer] of Occupational Health - Occupational Stress Questionnaire    Feeling of Stress: Not at all  Social Connections: Moderately Isolated (07/20/2023)   Social Connection and Isolation Panel    Frequency of Communication with Friends and Family: More than three times a week    Frequency of Social Gatherings with Friends and Family: Three times a week    Attends Religious Services: Never    Active Member of Clubs or Organizations: No    Attends Banker Meetings: Not on file    Marital Status: Married  Catering Manager Violence: Not At Risk (03/31/2024)   Humiliation, Afraid, Rape, and Kick questionnaire    Fear of Current or Ex-Partner: No    Emotionally Abused: No    Physically Abused: No    Sexually Abused: No      Family History  Problem Relation Age of Onset   Cancer  Mother        died @ 52   CAD Father        First MI @ 55. S/p heart transplant. Died in mid-50's of cancer.   Cancer Father    Other  Brother        alive and well   Vitals:   06/13/24 1032  BP: 124/70  Pulse: 76  SpO2: 100%  Weight: 218 lb 12.8 oz (99.2 kg)   Wt Readings from Last 3 Encounters:  06/13/24 218 lb 12.8 oz (99.2 kg)  03/31/24 208 lb (94.3 kg)  03/29/24 211 lb 9.6 oz (96 kg)   Lab Results  Component Value Date   CREATININE 2.06 (H) 03/31/2024   CREATININE 1.51 (H) 03/24/2023   CREATININE 1.58 (H) 03/23/2023    PHYSICAL EXAM:  General: Well appearing.  Cor: No JVD. Regular rhythm, rate.  Lungs: clear Abdomen: soft, nontender, nondistended. Extremities: 2+ pitting edema RLL. LLL has walking boot currently on Neuro:. Affect pleasant   ECG: NSR with PVC, RBBB, HR 77 (personally reviewed)   ASSESSMENT & PLAN:  1. Ischemic heart failure with mildly reduced ejection fraction- - NYHA class II - euvolemic - weight up 4 pounds from last visit here 6 months ago. Currently wearing a walking boot - Echo 09/26/19: EF 40-45% with moderate LVH, mild LAE with prosthetic mitral valve - Echo 10/29/20: EF 40-45% with mild LVH, mildly elevated PA pressure of 42.9 mmHg with repaired mitral valve with mean gradient of 7.0 mmHg - Echo 12/17/21: EF 45-50% with Grade II DD with repaired mitral valve with gradient of 3.5 mmHg - Echo 09/30/22: EF 45-50% with mitral gradient of 4.0 mmHg - updated echo ordered - Continue Farxiga  10 daily - Continue losartan  25 mg daily. Entresto caused SVT - Continue Toprol  XL 50 mg daily - Continue spironolactone  12.5mg  - Continue torsemide  60mg  AM/ 40mg  PM  - Reports nephrology gave him weekly metolazone  but then his renal function worsened so patient stopped taking it  - encouraged compression sock on right leg; left leg walking boot - reviewed the importance of keeping daily fluid intake to <60 ounces daily and that most should be water instead of diet soda/ unsweetened tea - BNP 12/30/22 was 81.5 - does not take flu vaccine  2: HTN- - BP 124/70 - saw PCP Robyne) 10/25 -  saw nephrology Jil) 11/25 - BMP 06/01/24 reviewed: sodium 136, potassium 3.4, creatinine 2.46 & GFR 29  3: CAD- - saw cardiology (Agbor-Etang) 12/24 - Late presentation inferior MI in 6/19 with CABG x 3 at [facility].  No chest pain.  - Continue ASA 81 and Plavix  75 daily for now.  Could consider dropping ASA in the future.  - continue rosuvastatin  20mg  daily - LDL 03/31/24 was 77  4: Atrial fibrillation-  - Only noted post-op after CABG in 2019.  No palpitations.  - Anticoagulate if atrial fibrillation is noted to recur.  - EKG today is NSR with PVC, RBBB  5: SVT/atrial tachycardia- - saw EP (Riddle) 05/25 - Continue amiodarone  200mg  daily for suppression - LFTs 12/03/23 looked good - TSH 12/03/23 was normal - He will need regular eye exam.   6: PAD- - Had PTCAs to below the knee arteries on left in 11/23.  - In 1/24, he had left transmetatarsal amputation. He is a nonsmoker.  - 03/24/23 angiography w/ balloon angioplasties R anterior tibia;, right tibioperoneal trunk and proximal posterior tibial artery - saw vascular Luna) 08/25  7: Mitral regurgitation- -  S/p MV repair with CABG 06/19 - Valve was stable on last echo.  8: DM- - A1c 06/01/24 was 7.2% - saw endocrinology (Solum) 09/25 - currently being followed by the wound center for diabetic foot ulcer   Return in 3 months, sooner if needed.   I spent 32 minutes reviewing records, interviewing/ examing patient and managing plan/ orders.   Ellouise DELENA Class, FNP 06/12/24

## 2024-06-13 ENCOUNTER — Ambulatory Visit: Attending: Family | Admitting: Family

## 2024-06-13 ENCOUNTER — Encounter: Payer: Self-pay | Admitting: Family

## 2024-06-13 VITALS — BP 124/70 | HR 76 | Wt 218.8 lb

## 2024-06-13 DIAGNOSIS — N1832 Chronic kidney disease, stage 3b: Secondary | ICD-10-CM

## 2024-06-13 DIAGNOSIS — R0602 Shortness of breath: Secondary | ICD-10-CM | POA: Diagnosis present

## 2024-06-13 DIAGNOSIS — I130 Hypertensive heart and chronic kidney disease with heart failure and stage 1 through stage 4 chronic kidney disease, or unspecified chronic kidney disease: Secondary | ICD-10-CM | POA: Diagnosis not present

## 2024-06-13 DIAGNOSIS — I739 Peripheral vascular disease, unspecified: Secondary | ICD-10-CM

## 2024-06-13 DIAGNOSIS — I471 Supraventricular tachycardia, unspecified: Secondary | ICD-10-CM | POA: Diagnosis not present

## 2024-06-13 DIAGNOSIS — Z794 Long term (current) use of insulin: Secondary | ICD-10-CM

## 2024-06-13 DIAGNOSIS — I9789 Other postprocedural complications and disorders of the circulatory system, not elsewhere classified: Secondary | ICD-10-CM | POA: Diagnosis not present

## 2024-06-13 DIAGNOSIS — E1151 Type 2 diabetes mellitus with diabetic peripheral angiopathy without gangrene: Secondary | ICD-10-CM | POA: Diagnosis not present

## 2024-06-13 DIAGNOSIS — G4733 Obstructive sleep apnea (adult) (pediatric): Secondary | ICD-10-CM | POA: Insufficient documentation

## 2024-06-13 DIAGNOSIS — I25810 Atherosclerosis of coronary artery bypass graft(s) without angina pectoris: Secondary | ICD-10-CM

## 2024-06-13 DIAGNOSIS — I4891 Unspecified atrial fibrillation: Secondary | ICD-10-CM | POA: Insufficient documentation

## 2024-06-13 DIAGNOSIS — N189 Chronic kidney disease, unspecified: Secondary | ICD-10-CM | POA: Diagnosis not present

## 2024-06-13 DIAGNOSIS — I5022 Chronic systolic (congestive) heart failure: Secondary | ICD-10-CM | POA: Diagnosis not present

## 2024-06-13 DIAGNOSIS — I340 Nonrheumatic mitral (valve) insufficiency: Secondary | ICD-10-CM

## 2024-06-13 DIAGNOSIS — I10 Essential (primary) hypertension: Secondary | ICD-10-CM

## 2024-06-13 DIAGNOSIS — Z951 Presence of aortocoronary bypass graft: Secondary | ICD-10-CM | POA: Insufficient documentation

## 2024-06-13 DIAGNOSIS — I255 Ischemic cardiomyopathy: Secondary | ICD-10-CM | POA: Diagnosis not present

## 2024-06-13 DIAGNOSIS — E785 Hyperlipidemia, unspecified: Secondary | ICD-10-CM | POA: Diagnosis not present

## 2024-06-13 DIAGNOSIS — I2510 Atherosclerotic heart disease of native coronary artery without angina pectoris: Secondary | ICD-10-CM | POA: Insufficient documentation

## 2024-06-13 DIAGNOSIS — E1122 Type 2 diabetes mellitus with diabetic chronic kidney disease: Secondary | ICD-10-CM | POA: Insufficient documentation

## 2024-06-13 DIAGNOSIS — Z79899 Other long term (current) drug therapy: Secondary | ICD-10-CM | POA: Insufficient documentation

## 2024-06-13 NOTE — Patient Instructions (Addendum)
 It was good to see you today!  Medication Changes:  No medication changes today!   Testing/Procedures:  Your physician has requested that you have an echocardiogram. Echocardiography is a painless test that uses sound waves to create images of your heart. It provides your doctor with information about the size and shape of your heart and how well your heart's chambers and valves are working. This procedure takes approximately one hour. There are no restrictions for this procedure. Please do NOT wear cologne, perfume, aftershave, or lotions (deodorant is allowed). Please arrive 15 minutes prior to your appointment time.  Please note: We ask at that you not bring children with you during ultrasound (echo/ vascular) testing. Due to room size and safety concerns, children are not allowed in the ultrasound rooms during exams. Our front office staff cannot provide observation of children in our lobby area while testing is being conducted. An adult accompanying a patient to their appointment will only be allowed in the ultrasound room at the discretion of the ultrasound technician under special circumstances. We apologize for any inconvenience.  SOMEONE WILL BE IN CONTACT WITH YOU TO SCHEDULE THIS APPOINTMENT    Follow-Up in: Please follow up with the Advanced Heart Failure Clinic in 3 months with Ellouise Class, FNP.   Thank you for choosing [facility] Advanced Heart Failure Clinic.    At the Advanced Heart Failure Clinic, you and your health needs are our priority. We have a designated team specialized in the treatment of Heart Failure. This Care Team includes your primary Heart Failure Specialized Cardiologist (physician), Advanced Practice Providers (APPs- Physician Assistants and Nurse Practitioners), and Pharmacist who all work together to provide you with the care you need, when you need it.   You may see any of the following providers on your designated Care Team at your next follow  up:  Dr. Toribio Fuel Dr. Ezra Shuck Dr. Ria Commander Dr. Morene Brownie Ellouise Class, FNP Jaun Bash, RPH-CPP  Please be sure to bring in all your medications bottles to every appointment.   Need to Contact Us :  If you have any questions or concerns before your next appointment please send us  a message through [facility] or call our office at [phone].    TO LEAVE A MESSAGE FOR THE NURSE SELECT OPTION 2, PLEASE LEAVE A MESSAGE INCLUDING: YOUR NAME DATE OF BIRTH CALL BACK NUMBER REASON FOR CALL**this is important as we prioritize the call backs  YOU WILL RECEIVE A CALL BACK THE SAME DAY AS LONG AS YOU CALL BEFORE 4:00 PM

## 2024-06-19 ENCOUNTER — Encounter: Admitting: Physician Assistant

## 2024-06-19 DIAGNOSIS — E11621 Type 2 diabetes mellitus with foot ulcer: Secondary | ICD-10-CM | POA: Diagnosis not present

## 2024-06-26 ENCOUNTER — Encounter: Admitting: Physician Assistant

## 2024-06-26 DIAGNOSIS — E11621 Type 2 diabetes mellitus with foot ulcer: Secondary | ICD-10-CM | POA: Diagnosis not present

## 2024-07-02 NOTE — Progress Notes (Unsigned)
  Electrophysiology Office Follow up Visit Note:    Date:  07/04/2024   ID:  Ricardo Brown, DOB [birth year], MRN [id number]  PCP:  Ricardo Ophelia JINNY DOUGLAS, MD  [facility] Cardiologist:  Ricardo Cave, MD  [facility] Electrophysiologist:  Ricardo ONEIDA HOLTS, MD    Interval History:     Ricardo Brown is a 63 y.o. male who presents for a follow up visit.   The patient Ricardo Brown Dec 03, 2023.  He has a history of SVT, atrial tachycardia, chronic systolic heart failure, coronary artery disease post CABG, hypertension, peripheral vascular disease.  He is on amiodarone  for SVT suppression.  He had a prior EP study in 2023 revealing an atrial tachycardia originating near the his bundle.  He is doing well today.  No recurrence of arrhythmia.  He missed his medications yesterday.  His nephrologist recently added metolazone  for volume overload but this had to be stopped because of worsening kidney function.       Past medical, surgical, social and family history were reviewed.  ROS:   Please see the history of present illness.    All other systems reviewed and are negative.  EKGs/Labs/Other Studies Reviewed:    The following studies were reviewed today:          Physical Exam:    VS:  BP (!) 152/74   Pulse 84   Ht 5' 8 (1.727 m)   Wt 213 lb 3.2 oz (96.7 kg)   SpO2 97%   BMI 32.42 kg/m     Wt Readings from Last 3 Encounters:  07/04/24 213 lb 3.2 oz (96.7 kg)  06/13/24 218 lb 12.8 oz (99.2 kg)  03/31/24 208 lb (94.3 kg)     GEN: no distress CARD: RRR, No MRG RESP: No IWOB. CTAB.      ASSESSMENT:    1. PSVT (paroxysmal supraventricular tachycardia)   2. Encounter for long-term (current) use of high-risk medication   3. Coronary artery disease involving coronary bypass graft of native heart without angina pectoris   4. Chronic heart failure with mildly reduced ejection fraction (HFmrEF, 41-49%) (HCC)    PLAN:    In order of problems  listed above:  #SVT #High risk med monitoring-amiodarone  Good rhythm control using amiodarone  I would like to reduce the dose of his amiodarone  to 100 mg by mouth daily given his young age and chronicity of amiodarone  use.  I explained the rationale that this could help control his arrhythmia and reduce the risk of off target effects.  The patient is very clear that he will not reduce the dose of his amiodarone  and would like to continue on 200 mg by mouth daily.  He understands that there is a risk of off target effects with this higher dose.   Repeat CMP, TSH and free T4 today  #Chronic systolic heart failure NYHA class II.  Warm and dry on exam. Last EF was 45% in February 2024.  Repeat echo scheduled for January 2026. Continue spironolactone , metoprolol , losartan , Farxiga   #Coronary artery disease No ischemic symptoms today.  Continue aspirin  and Plavix  and statin  I discussed my upcoming departure from Ricardo Brown during today's clinic appointment.  The patient will continue to follow-up with one of my EP partners moving forward.  Follow-up with EP APP in 6 months   Signed, Ricardo Holts, MD, [facility], [facility] 07/04/2024 8:59 AM    Electrophysiology [facility]

## 2024-07-04 ENCOUNTER — Encounter: Attending: Physician Assistant | Admitting: Physician Assistant

## 2024-07-04 ENCOUNTER — Ambulatory Visit: Attending: Cardiology | Admitting: Cardiology

## 2024-07-04 ENCOUNTER — Encounter: Payer: Self-pay | Admitting: Cardiology

## 2024-07-04 VITALS — BP 152/74 | HR 84 | Ht 68.0 in | Wt 213.2 lb

## 2024-07-04 DIAGNOSIS — Z79899 Other long term (current) drug therapy: Secondary | ICD-10-CM | POA: Insufficient documentation

## 2024-07-04 DIAGNOSIS — E1122 Type 2 diabetes mellitus with diabetic chronic kidney disease: Secondary | ICD-10-CM | POA: Insufficient documentation

## 2024-07-04 DIAGNOSIS — I2510 Atherosclerotic heart disease of native coronary artery without angina pectoris: Secondary | ICD-10-CM | POA: Diagnosis not present

## 2024-07-04 DIAGNOSIS — I25810 Atherosclerosis of coronary artery bypass graft(s) without angina pectoris: Secondary | ICD-10-CM | POA: Diagnosis not present

## 2024-07-04 DIAGNOSIS — E11621 Type 2 diabetes mellitus with foot ulcer: Secondary | ICD-10-CM | POA: Diagnosis present

## 2024-07-04 DIAGNOSIS — I471 Supraventricular tachycardia, unspecified: Secondary | ICD-10-CM | POA: Insufficient documentation

## 2024-07-04 DIAGNOSIS — N183 Chronic kidney disease, stage 3 unspecified: Secondary | ICD-10-CM | POA: Insufficient documentation

## 2024-07-04 DIAGNOSIS — L97522 Non-pressure chronic ulcer of other part of left foot with fat layer exposed: Secondary | ICD-10-CM | POA: Diagnosis not present

## 2024-07-04 DIAGNOSIS — I5022 Chronic systolic (congestive) heart failure: Secondary | ICD-10-CM | POA: Diagnosis not present

## 2024-07-04 DIAGNOSIS — I129 Hypertensive chronic kidney disease with stage 1 through stage 4 chronic kidney disease, or unspecified chronic kidney disease: Secondary | ICD-10-CM | POA: Insufficient documentation

## 2024-07-04 DIAGNOSIS — Z09 Encounter for follow-up examination after completed treatment for conditions other than malignant neoplasm: Secondary | ICD-10-CM | POA: Insufficient documentation

## 2024-07-04 DIAGNOSIS — E1143 Type 2 diabetes mellitus with diabetic autonomic (poly)neuropathy: Secondary | ICD-10-CM | POA: Diagnosis not present

## 2024-07-04 DIAGNOSIS — I480 Paroxysmal atrial fibrillation: Secondary | ICD-10-CM | POA: Insufficient documentation

## 2024-07-04 NOTE — Patient Instructions (Signed)
 Medication Instructions:  Your physician recommends that you continue on your current medications as directed. Please refer to the Current Medication list given to you today.   *If you need a refill on your cardiac medications before your next appointment, please call your pharmacy*  Lab Work: Your provider would like for you to have following labs drawn today CMP, TSH, and Free T4.   If you have labs (blood work) drawn today and your tests are completely normal, you will receive your results only by: MyChart Message (if you have MyChart) OR A paper copy in the mail If you have any lab test that is abnormal or we need to change your treatment, we will call you to review the results.  Follow-Up: At [facility], you and your health needs are our priority.  As part of our continuing mission to provide you with exceptional heart care, our providers are all part of one team.  This team includes your primary Cardiologist (physician) and Advanced Practice Providers or APPs (Physician Assistants and Nurse Practitioners) who all work together to provide you with the care you need, when you need it.  Your next appointment:   6 month(s)  Provider:   You may see Suzann Riddle, NP

## 2024-07-05 LAB — COMPREHENSIVE METABOLIC PANEL WITH GFR
ALT: 17 IU/L (ref 0–44)
AST: 19 IU/L (ref 0–40)
Albumin: 4.2 g/dL (ref 3.9–4.9)
Alkaline Phosphatase: 73 IU/L (ref 47–123)
BUN/Creatinine Ratio: 21 (ref 10–24)
BUN: 31 mg/dL — ABNORMAL HIGH (ref 8–27)
Bilirubin Total: 0.2 mg/dL (ref 0.0–1.2)
CO2: 23 mmol/L (ref 20–29)
Calcium: 9 mg/dL (ref 8.6–10.2)
Chloride: 96 mmol/L (ref 96–106)
Creatinine, Ser: 1.46 mg/dL — ABNORMAL HIGH (ref 0.76–1.27)
Globulin, Total: 2.4 g/dL (ref 1.5–4.5)
Glucose: 186 mg/dL — ABNORMAL HIGH (ref 70–99)
Potassium: 4.4 mmol/L (ref 3.5–5.2)
Sodium: 140 mmol/L (ref 134–144)
Total Protein: 6.6 g/dL (ref 6.0–8.5)
eGFR: 54 mL/min/1.73 — ABNORMAL LOW (ref >59)

## 2024-07-05 LAB — T4, FREE: Free T4: 1.18 ng/dL (ref 0.82–1.77)

## 2024-07-05 LAB — TSH: TSH: 5.02 u[IU]/mL — ABNORMAL HIGH (ref 0.450–4.500)

## 2024-07-10 ENCOUNTER — Other Ambulatory Visit: Payer: Self-pay

## 2024-07-10 ENCOUNTER — Ambulatory Visit: Payer: Self-pay | Admitting: Cardiology

## 2024-07-10 DIAGNOSIS — Z79899 Other long term (current) drug therapy: Secondary | ICD-10-CM

## 2024-07-10 DIAGNOSIS — I471 Supraventricular tachycardia, unspecified: Secondary | ICD-10-CM

## 2024-07-14 ENCOUNTER — Ambulatory Visit: Attending: Cardiology | Admitting: Cardiology

## 2024-07-14 ENCOUNTER — Encounter: Payer: Self-pay | Admitting: Cardiology

## 2024-07-14 VITALS — BP 142/81 | HR 74 | Ht 68.0 in | Wt 210.0 lb

## 2024-07-14 DIAGNOSIS — Z951 Presence of aortocoronary bypass graft: Secondary | ICD-10-CM

## 2024-07-14 DIAGNOSIS — I471 Supraventricular tachycardia, unspecified: Secondary | ICD-10-CM | POA: Diagnosis not present

## 2024-07-14 DIAGNOSIS — I10 Essential (primary) hypertension: Secondary | ICD-10-CM | POA: Diagnosis not present

## 2024-07-14 DIAGNOSIS — I255 Ischemic cardiomyopathy: Secondary | ICD-10-CM

## 2024-07-14 MED ORDER — TORSEMIDE 60 MG PO TABS: ORAL_TABLET | ORAL | 3 refills | Status: AC

## 2024-07-14 NOTE — Patient Instructions (Signed)
 Medication Instructions:  - INCREASE torsemide  to 60 mg in the morning and 60 mg in the evening   *If you need a refill on your cardiac medications before your next appointment, please call your pharmacy*  Lab Work: Your provider would like for you to return in 2 weeks to have the following labs drawn: BMP.   Please go to [facility] [address] ([facility] [address], [place] [address] You do not need an appointment.  They are open from 8 am- 4:30 pm.  Lunch from 1:00 pm- 2:00 pm You do not need to be fasting.   You may also go to one of the following [facility]:  [address] [place], [place] [address] Phone: [phone] Lab hours: Mon-Fri 8 am- 5 pm    Lunch 12 pm- 1 pm  [address] [place],  [place]  [address]  [place]  Phone: [phone] Lab hours: 7 am- 4 pm Lunch 12 pm-1 pm   [address] [place],  [place]  [address]  [place]  Phone: [phone] Lab hours: Mon-Fri 8 am- 5 pm    Lunch 12 pm- 1 pm  If you have labs (blood work) drawn today and your tests are completely normal, you will receive your results only by: MyChart Message (if you have MyChart) OR A paper copy in the mail If you have any lab test that is abnormal or we need to change your treatment, we will call you to review the results.  Testing/Procedures: No test ordered today   Follow-Up: At [facility], you and your health needs are our priority.  As part of our continuing mission to provide you with exceptional heart care, our providers are all part of one team.  This team includes your primary Cardiologist (physician) and Advanced Practice Providers or APPs (Physician Assistants and Nurse Practitioners) who all work together to provide you with the care you need, when you need it.  Your next appointment:   3 month(s)  Provider:   You may see Redell Cave, MD or one of the following Advanced Practice Providers on your designated Care Team:   Lonni Meager,  NP Lesley Maffucci, PA-C Bernardino Bring, PA-C Cadence Gordon, PA-C Tylene Lunch, NP Barnie Hila, NP    We recommend signing up for the patient portal called MyChart.  Sign up information is provided on this After Visit Summary.  MyChart is used to connect with patients for Virtual Visits (Telemedicine).  Patients are able to view lab/test results, encounter notes, upcoming appointments, etc.  Non-urgent messages can be sent to your provider as well.   To learn more about what you can do with MyChart, go to [email or web address].

## 2024-07-14 NOTE — Progress Notes (Signed)
 Cardiology Office Note:    Date:  07/14/2024   ID:  Ricardo Brown, DOB 08/12/1960, MRN [id number]  PCP:  Fernande Ophelia JINNY DOUGLAS, MD  Cardiologist:  Redell Cave, MD  Electrophysiologist:  OLE ONEIDA HOLTS, MD   Referring MD: Fernande Ophelia JINNY DOUGLAS, MD   Chief Complaint  Patient presents with   Follow-up    12 month follow up visit. Patient states that he has a little bit of swelling in his lower extremities. Meds reviewed.    History of Present Illness:    Ricardo Brown is a 63 y.o. male with a hx of SVT on amio , CAD/CABG x3 in 2019, HFrEF (initial EF 40-45%, last EF 45-50%), hypertension, hyperlipidemia, diabetes, CKD-3 who presents for follow-up.   Denies chest pain or shortness of breath, endorses worsening leg edema over the past week or so.  Follow-up with nephrology, was previously on metolazone  once weekly because he worsening renal function.  Wears compression stockings to help with swelling.  Recent thyroid  function testing was abnormal, followed up with EP, recheck TSH in 3 months planned.   Prior notes Echo 12/2021 EF 45 to 50% Echo 10/2020 EF 40 to 45%, grade 2 diastolic dysfunction. Patient was seen in the hospital/[facility] in 06/ 2019 due to nausea, weakness, myalgias.  EKG at the time showed inferior ST elevation with reciprocal changes in the anterior leads and small inferior Q waves.  Patient was deemed late presenting STEMI.   Left heart cath was performed, briefly showed occluded distal RCA, 90% mid LAD, 90% proximal left circumflex.  Due to patient being a diabetic, and left heart cath showing diffuse three-vessel disease, CABG was recommended.  He underwent coronary artery bypass grafting x3 in July 2019 at [facility]. Aldactone  caused worsening renal dysfunction, this was stopped.  Sim previously tried on patient, he did not tolerate.  He developed feeling of unwell, went into SVT and was taken to the emergency room.  Patient with history  of SVT, who was seen in the emergency room and adenosine  given x2 and placed on amiodarone   Echo on 09/2019 showed mild to moderately reduced ejection fraction, EF 40 to 45%.  Past Medical History:  Diagnosis Date   Atrial fibrillation (HCC)    Chronic combined systolic (congestive) and diastolic (congestive) heart failure (HCC)    a. 01/2018 EF 35-40%; b. 10/2020 Echo: EF 40-45%, glob HK. RVSP 42.62mmHg. Mildly dil LA. Mild MS/AoV sclerosis.   CKD (chronic kidney disease), stage Brown (HCC)    Coronary artery disease    a. 01/2018 late presenting inferior MI; b. 01/2018 Cath: Severe multivessel dzs-->CABG x 3 (LIMA->LAD, VG->OM2, VG->RPDA @ [facility]).   Diabetes mellitus without complication (HCC)    a. Dx ~ 2000.   Heart palpitations    a. Pt reports prior nl echo's and stress tests. Last stress test w/in past 2 yrs - PCP.   High cholesterol    History of Bell's palsy    History of mitral valve replacement with bioprosthetic valve    Hypertension    Mitral regurgitation    a. 01/2018 s/p MV repair @ time of CABG.   Myocardial infarction [facility])    Neuropathy    legs and hands   Postoperative atrial fibrillation    a. 01/2018 @ time of CABG.   PSVT (paroxysmal supraventricular tachycardia)    a. Very symptomatic with multiple ED evaluations.  Has been on amio.   Severe sepsis with acute organ dysfunction (HCC)  12/19/2021   Sleep apnea    mild - has CPAP, doesn't use   Sleep difficulties 09/07/2019   STEMI (ST elevation myocardial infarction) (HCC) 01/24/2018   Wears dentures    full upper and lower    Past Surgical History:  Procedure Laterality Date   AMPUTATION TOE Right 08/29/2021   Procedure: AMPUTATION TOE-2nd toe;  Surgeon: Neill Boas, DPM;  Location: [facility] ORS;  Service: Podiatry;  Laterality: Right;   AMPUTATION TOE Left 12/22/2021   Procedure: AMPUTATION TOE-2nd Toe Partial;  Surgeon: Lennie Barter, DPM;  Location: [facility] ORS;  Service: Podiatry;  Laterality: Left;   CARDIAC  CATHETERIZATION     CARDIAC VALVE REPLACEMENT     Mitral Valve Repair   CATARACT EXTRACTION W/PHACO Left 10/08/2022   Procedure: CATARACT EXTRACTION PHACO AND INTRAOCULAR LENS PLACEMENT (IOC) LEFT DIABETIC OMIDRIA 20.94 01:35.9;  Surgeon: Enola Feliciano Hugger, MD;  Location: [facility];  Service: Ophthalmology;  Laterality: Left;  Diabetic   COLONOSCOPY WITH PROPOFOL  N/A 06/07/2019   Procedure: COLONOSCOPY WITH PROPOFOL ;  Surgeon: Janalyn Keene NOVAK, MD;  Location: [facility] ENDOSCOPY;  Service: Endoscopy;  Laterality: N/A;   COLONOSCOPY WITH PROPOFOL  N/A 02/07/2020   Procedure: COLONOSCOPY WITH PROPOFOL ;  Surgeon: Janalyn Keene NOVAK, MD;  Location: [facility] ENDOSCOPY;  Service: Endoscopy;  Laterality: N/A;   COLONOSCOPY WITH PROPOFOL  N/A 12/11/2020   Procedure: COLONOSCOPY WITH PROPOFOL ;  Surgeon: Janalyn Keene NOVAK, MD;  Location: [facility] ENDOSCOPY;  Service: Endoscopy;  Laterality: N/A;   COLONOSCOPY WITH PROPOFOL  N/A 07/12/2023   Procedure: COLONOSCOPY WITH PROPOFOL ;  Surgeon: Therisa Bi, MD;  Location: [facility] ENDOSCOPY;  Service: Gastroenterology;  Laterality: N/A;   COLONOSCOPY WITH PROPOFOL  N/A 09/07/2023   Procedure: COLONOSCOPY WITH PROPOFOL ;  Surgeon: Therisa Bi, MD;  Location: [facility] ENDOSCOPY;  Service: Gastroenterology;  Laterality: N/A;   CORONARY ANGIOPLASTY     CORONARY ARTERY BYPASS GRAFT     x 3   01/2018   ESOPHAGOGASTRODUODENOSCOPY N/A 02/07/2020   Procedure: ESOPHAGOGASTRODUODENOSCOPY (EGD);  Surgeon: Janalyn Keene NOVAK, MD;  Location: [facility] ENDOSCOPY;  Service: Endoscopy;  Laterality: N/A;   GASTROC RECESSION EXTREMITY Left 02/24/2024   Procedure: RECESSION, TENDON, GASTROCNEMIUS;  Surgeon: Lennie Barter, DPM;  Location: [facility] SURGERY [facility];  Service: Orthopedics/Podiatry;  Laterality: Left;   HEMOSTASIS CLIP PLACEMENT  07/12/2023   Procedure: HEMOSTASIS CLIP PLACEMENT;  Surgeon: Therisa Bi, MD;  Location: [facility] ENDOSCOPY;  Service: Gastroenterology;;   INCISION AND DRAINAGE Left  06/05/2022   Procedure: INCISION AND DRAINAGE;  Surgeon: Lennie Barter, DPM;  Location: [facility] ORS;  Service: Podiatry;  Laterality: Left;   LEFT HEART CATH AND CORONARY ANGIOGRAPHY N/A 01/24/2018   Procedure: LEFT HEART CATH AND CORONARY ANGIOGRAPHY;  Surgeon: Darron Deatrice LABOR, MD;  Location: [facility] INVASIVE CV LAB;  Service: Cardiovascular;  Laterality: N/A;   LOWER EXTREMITY ANGIOGRAPHY Left 06/08/2022   Procedure: Lower Extremity Angiography;  Surgeon: Marea Selinda RAMAN, MD;  Location: [facility] INVASIVE CV LAB;  Service: Cardiovascular;  Laterality: Left;   LOWER EXTREMITY INTERVENTION Right 03/24/2023   Procedure: LOWER EXTREMITY INTERVENTION;  Surgeon: Marea Selinda RAMAN, MD;  Location: [facility] INVASIVE CV LAB;  Service: Cardiovascular;  Laterality: Right;   METATARSAL HEAD EXCISION Left 02/24/2024   Procedure: EXCISION, METATARSAL BONE, HEAD;  Surgeon: Lennie Barter, DPM;  Location: [facility];  Service: Orthopedics/Podiatry;  Laterality: Left;   POLYPECTOMY  07/12/2023   Procedure: POLYPECTOMY;  Surgeon: Therisa Bi, MD;  Location: [facility] ENDOSCOPY;  Service: Gastroenterology;;   POLYPECTOMY  09/07/2023   Procedure: POLYPECTOMY;  Surgeon: Therisa,  Ruel, MD;  Location: [facility] ENDOSCOPY;  Service: Gastroenterology;;   SVT ABLATION N/A 09/12/2021   Procedure: SVT ABLATION;  Surgeon: Cindie Ole DASEN, MD;  Location: [facility] INVASIVE CV LAB;  Service: Cardiovascular;  Laterality: N/A;   TONSILLECTOMY     TRANSMETATARSAL AMPUTATION Left 08/04/2022   Procedure: TRANSMETATARSAL AMPUTATION;  Surgeon: Neill Boas, DPM;  Location: [facility] ORS;  Service: Podiatry;  Laterality: Left;   WOUND DEBRIDEMENT Left 02/24/2024   Procedure: DEBRIDEMENT, WOUND;  Surgeon: Lennie Barter, DPM;  Location: [facility]ERY [facility];  Service: Orthopedics/Podiatry;  Laterality: Left;    Current Medications: Current Meds  Medication Sig   amiodarone  (PACERONE ) 200 MG tablet Take 1 tablet by mouth once daily   ascorbic acid  (VITAMIN C ) 500 MG tablet  Take 1 tablet (500 mg total) by mouth 2 (two) times daily.   aspirin  81 MG EC tablet Take 81 mg by mouth daily.    b complex vitamins tablet Take 1 tablet by mouth daily.    clopidogrel  (PLAVIX ) 75 MG tablet Take 1 tablet by mouth once daily   Continuous Glucose Sensor (FREESTYLE LIBRE 3 SENSOR) MISC 1 each by Does not apply route every 14 (fourteen) days. Place 1 sensor on the skin every 14 days. Use to check glucose continuously   dapagliflozin  propanediol (FARXIGA ) 10 MG TABS tablet Take 1 tablet (10 mg total) by mouth daily before breakfast.   Dulaglutide (TRULICITY) 1.5 MG/0.5ML SOPN Inject 1.5 mg into the skin once a week.   gabapentin  (NEURONTIN ) 100 MG capsule Take 1 capsule (100 mg total) by mouth 2 (two) times daily.   insulin  degludec (TRESIBA) 200 UNIT/ML FlexTouch Pen 32 Units at bedtime.   ketoconazole 2%-triamcinolone  0.1% 1:2 cream mixture Apply topically 2 (two) times daily.   losartan  (COZAAR ) 25 MG tablet Take 1 tablet by mouth once daily   metFORMIN  (GLUCOPHAGE ) 500 MG tablet Take 2 tablets (1,000 mg total) by mouth 2 (two) times daily.   metoprolol  succinate (TOPROL -XL) 50 MG 24 hr tablet Take 1 tablet (50 mg total) by mouth daily. Take with or immediately following a meal.   Omega-3 Fatty Acids (FISH OIL) 875 MG CAPS Take 875 mg by mouth 2 (two) times daily.   pramipexole  (MIRAPEX ) 1.5 MG tablet Take 1 tablet (1.5 mg total) by mouth daily.   rosuvastatin  (CRESTOR ) 20 MG tablet Take 1 tablet (20 mg total) by mouth daily.   spironolactone  (ALDACTONE ) 25 MG tablet Take 0.5 tablets (12.5 mg total) by mouth daily.   [DISCONTINUED] torsemide  (DEMADEX ) 20 MG tablet TAKE 3 TABLETS BY MOUTH IN THE MORNING AND 2 IN THE EVENING     Allergies:   Lipitor [atorvastatin], Sacubitril-valsartan, and Pregabalin   Social History   Socioeconomic History   Marital status: Married    Spouse name: Not on file   Number of children: Not on file   Years of education: Not on file   Highest  education level: 12th grade  Occupational History   Not on file  Tobacco Use   Smoking status: Never   Smokeless tobacco: Never  Vaping Use   Vaping status: Never Used  Substance and Sexual Activity   Alcohol use: Yes    Comment: rare beer   Drug use: No   Sexual activity: Not on file  Other Topics Concern   Not on file  Social History Narrative   Lives locally with wife.  Works in [employer]  Does not routinely exercise.   Social Drivers of Health   Tobacco  Use: Low Risk (07/14/2024)   Patient History    Smoking Tobacco Use: Never    Smokeless Tobacco Use: Never    Passive Exposure: Not on file  Financial Resource Strain: Low Risk  (05/04/2024)   Received from [facility]   Overall Financial Resource Strain (CARDIA)    Difficulty of Paying Living Expenses: Not hard at all  Food Insecurity: No Food Insecurity (05/04/2024)   Received from [facility]   Epic    Within the past 12 months, you worried that your food would run out before you got the money to buy more.: Never true    Within the past 12 months, the food you bought just didn't last and you didn't have money to get more.: Never true  Transportation Needs: No Transportation Needs (05/04/2024)   Received from [facility] - Transportation    In the past 12 months, has lack of transportation kept you from medical appointments or from getting medications?: No    Lack of Transportation (Non-Medical): No  Physical Activity: Insufficiently Active (07/20/2023)   Exercise Vital Sign    Days of Exercise per Week: 3 days    Minutes of Exercise per Session: 30 min  Stress: No Stress Concern Present (03/31/2024)   [employer] - Occupational Stress Questionnaire    Feeling of Stress: Not at all  Social Connections: Moderately Isolated (07/20/2023)   Social Connection and Isolation Panel    Frequency of Communication with Friends and  Family: More than three times a week    Frequency of Social Gatherings with Friends and Family: Three times a week    Attends Religious Services: Never    Active Member of Clubs or Organizations: No    Attends Banker Meetings: Not on file    Marital Status: Married  Depression (PHQ2-9): Low Risk (03/31/2024)   Depression (PHQ2-9)    PHQ-2 Score: 2  Alcohol Screen: Low Risk (03/31/2024)   Alcohol Screen    Last Alcohol Screening Score (AUDIT): 2  Housing: Low Risk  (05/04/2024)   Received from [facility]   Epic    In the last 12 months, was there a time when you were not able to pay the mortgage or rent on time?: No    In the past 12 months, how many times have you moved where you were living?: 0    At any time in the past 12 months, were you homeless or living in a shelter (including now)?: No  Recent Concern: Housing - High Risk (04/26/2024)   Received from [facility]   Epic    In the last 12 months, was there a time when you were not able to pay the mortgage or rent on time?: Yes    In the past 12 months, how many times have you moved where you were living?: 0    At any time in the past 12 months, were you homeless or living in a shelter (including now)?: No  Utilities: Not At Risk (05/04/2024)   Received from [facility]   Epic    In the past 12 months has the electric, gas, oil, or water company threatened to shut off services in your home?: No  Health Literacy: Adequate Health Literacy (03/31/2024)   B1300 Health Literacy    Frequency of need for help with medical instructions: Never     Family History: The patient's  family history includes CAD in his father; Cancer in his father and mother; Other in his brother.  ROS:   Please see the history of present illness.     All other systems reviewed and are negative.  EKGs/Labs/Other Studies Reviewed:    The following studies were reviewed today:         Recent Labs: 07/04/2024: ALT 17; BUN 31; Creatinine, Ser 1.46; Potassium 4.4; Sodium 140; TSH 5.020  Recent Lipid Panel    Component Value Date/Time   CHOL 143 03/31/2024 1147   TRIG 198 (H) 03/31/2024 1147   HDL 32 (L) 03/31/2024 1147   CHOLHDL 4.5 03/31/2024 1147   CHOLHDL 3.9 12/09/2022 1210   VLDL 29 12/09/2022 1210   LDLCALC 77 03/31/2024 1147   LDLCALC 67 09/30/2020 1114    Physical Exam:    VS:  BP (!) 142/81   Pulse 74   Ht 5' 8 (1.727 m)   Wt 210 lb (95.3 kg)   SpO2 98%   BMI 31.93 kg/m     Wt Readings from Last 3 Encounters:  07/14/24 210 lb (95.3 kg)  07/04/24 213 lb 3.2 oz (96.7 kg)  06/13/24 218 lb 12.8 oz (99.2 kg)     GEN:  Well nourished, well developed in no acute distress HEENT: Normal NECK: No JVD; No carotid bruits CARDIAC: RRR, no murmurs, rubs, gallops RESPIRATORY:  Clear to auscultation without rales, wheezing or rhonchi  ABDOMEN: Soft, non-tender, distended MUSCULOSKELETAL:  1+ edema; No deformity  SKIN: Warm and dry NEUROLOGIC:  Alert and oriented x 3 PSYCHIATRIC:  Normal affect   ASSESSMENT:    1. Hx of CABG   2. Ischemic cardiomyopathy   3. Primary hypertension   4. SVT (supraventricular tachycardia)    PLAN:    In order of problems listed above:  CAD s/p CABG x3 in 2019. Denies chest pain.  LDL at goal.  Continue aspirin  and Crestor  20 mg daily. ICM, echo 2/24 EF 45 - 50%.describes NYHA class II-Brown symptoms.  1+ leg edema.  Increase torsemide  to 60 mg twice daily, check bmp in 14 days. continue losartan  25 mg daily, Toprol -XL 50 mg daily, Aldactone  12.5 mg daily, Farxiga . did not tolerate Entresto in the past, went into SVT.  Hypertension, BP elevated today, usually controlled.  Continue Toprol -XL, losartan , Aldactone .   SVT, currently in sinus rhythm on amiodarone .  Continue amiodarone  200 mg daily.  Reduce amiodarone  to 100 mg daily if TSH stays abnormal in 3 months as recommended by EP.  Follow-up in 3  months.   Medication Adjustments/Labs and Tests Ordered: Current medicines are reviewed at length with the patient today.  Concerns regarding medicines are outlined above.  Orders Placed This Encounter  Procedures   Basic metabolic panel with GFR     Meds ordered this encounter  Medications   torsemide  60 MG TABS    Sig: Take one tablet in the morning and one tablet in the evening    Dispense:  90 tablet    Refill:  3      Patient Instructions  Medication Instructions:  - INCREASE torsemide  to 60 mg in the morning and 60 mg in the evening   *If you need a refill on your cardiac medications before your next appointment, please call your pharmacy*  Lab Work: Your provider would like for you to return in 2 weeks to have the following labs drawn: BMP.   Please go to [facility] [address] ([facility]) [address], [place] [address] You do not need an appointment.  They are open from 8 am- 4:30 pm.  Lunch from 1:00 pm- 2:00 pm You do not need to be fasting.   You may also go to one of the following [facility]:  [address] [place], [place] [address] Phone: [phone] Lab hours: Mon-Fri 8 am- 5 pm    Lunch 12 pm- 1 pm  [address] [place],  [place]  [address]  [place]  Phone: [phone] Lab hours: 7 am- 4 pm Lunch 12 pm-1 pm   [address] [place],  [place]  [address]  [place]  Phone: [phone] Lab hours: Mon-Fri 8 am- 5 pm    Lunch 12 pm- 1 pm  If you have labs (blood work) drawn today and your tests are completely normal, you will receive your results only by: MyChart Message (if you have MyChart) OR A paper copy in the mail If you have any lab test that is abnormal or we need to change your treatment, we will call you to review the results.  Testing/Procedures: No test ordered today   Follow-Up: At [facility], you and your health needs are our priority.  As part of our continuing mission to provide you with exceptional heart  care, our providers are all part of one team.  This team includes your primary Cardiologist (physician) and Advanced Practice Providers or APPs (Physician Assistants and Nurse Practitioners) who all work together to provide you with the care you need, when you need it.  Your next appointment:   3 month(s)  Provider:   You may see Redell Cave, MD or one of the following Advanced Practice Providers on your designated Care Team:   Lonni Meager, NP Lesley Maffucci, PA-C Bernardino Bring, PA-C Cadence Brewton, PA-C Tylene Lunch, NP Barnie Hila, NP    We recommend signing up for the patient portal called MyChart.  Sign up information is provided on this After Visit Summary.  MyChart is used to connect with patients for Virtual Visits (Telemedicine).  Patients are able to view lab/test results, encounter notes, upcoming appointments, etc.  Non-urgent messages can be sent to your provider as well.   To learn more about what you can do with MyChart, go to [email or web address].            Signed, Redell Cave, MD  07/14/2024 4:30 PM    [facility]

## 2024-07-25 ENCOUNTER — Encounter: Admitting: Physician Assistant

## 2024-07-25 DIAGNOSIS — Z09 Encounter for follow-up examination after completed treatment for conditions other than malignant neoplasm: Secondary | ICD-10-CM | POA: Diagnosis not present

## 2024-07-26 LAB — BASIC METABOLIC PANEL WITH GFR
BUN/Creatinine Ratio: 21 (ref 10–24)
BUN: 36 mg/dL — ABNORMAL HIGH (ref 8–27)
CO2: 26 mmol/L (ref 20–29)
Calcium: 9.3 mg/dL (ref 8.6–10.2)
Chloride: 96 mmol/L (ref 96–106)
Creatinine, Ser: 1.74 mg/dL — ABNORMAL HIGH (ref 0.76–1.27)
Glucose: 130 mg/dL — ABNORMAL HIGH (ref 70–99)
Potassium: 4.6 mmol/L (ref 3.5–5.2)
Sodium: 138 mmol/L (ref 134–144)
eGFR: 44 mL/min/1.73 — ABNORMAL LOW

## 2024-07-30 ENCOUNTER — Ambulatory Visit: Payer: Self-pay | Admitting: Cardiology

## 2024-08-08 ENCOUNTER — Encounter: Attending: Physician Assistant | Admitting: Physician Assistant

## 2024-08-08 DIAGNOSIS — I480 Paroxysmal atrial fibrillation: Secondary | ICD-10-CM | POA: Insufficient documentation

## 2024-08-08 DIAGNOSIS — E1122 Type 2 diabetes mellitus with diabetic chronic kidney disease: Secondary | ICD-10-CM | POA: Insufficient documentation

## 2024-08-08 DIAGNOSIS — E1143 Type 2 diabetes mellitus with diabetic autonomic (poly)neuropathy: Secondary | ICD-10-CM | POA: Insufficient documentation

## 2024-08-08 DIAGNOSIS — I129 Hypertensive chronic kidney disease with stage 1 through stage 4 chronic kidney disease, or unspecified chronic kidney disease: Secondary | ICD-10-CM | POA: Insufficient documentation

## 2024-08-08 DIAGNOSIS — I2510 Atherosclerotic heart disease of native coronary artery without angina pectoris: Secondary | ICD-10-CM | POA: Diagnosis not present

## 2024-08-08 DIAGNOSIS — N183 Chronic kidney disease, stage 3 unspecified: Secondary | ICD-10-CM | POA: Insufficient documentation

## 2024-08-08 DIAGNOSIS — L97522 Non-pressure chronic ulcer of other part of left foot with fat layer exposed: Secondary | ICD-10-CM | POA: Diagnosis not present

## 2024-08-08 DIAGNOSIS — L97512 Non-pressure chronic ulcer of other part of right foot with fat layer exposed: Secondary | ICD-10-CM | POA: Diagnosis not present

## 2024-08-08 DIAGNOSIS — E11621 Type 2 diabetes mellitus with foot ulcer: Secondary | ICD-10-CM | POA: Insufficient documentation

## 2024-08-08 DIAGNOSIS — R2689 Other abnormalities of gait and mobility: Secondary | ICD-10-CM | POA: Insufficient documentation

## 2024-08-09 ENCOUNTER — Ambulatory Visit: Attending: Family

## 2024-08-09 DIAGNOSIS — I5022 Chronic systolic (congestive) heart failure: Secondary | ICD-10-CM | POA: Insufficient documentation

## 2024-08-09 LAB — ECHOCARDIOGRAM COMPLETE
AR max vel: 1.82 cm2
AV Area VTI: 1.79 cm2
AV Area mean vel: 1.73 cm2
AV Mean grad: 3 mmHg
AV Peak grad: 5.8 mmHg
Ao pk vel: 1.2 m/s
Area-P 1/2: 4.06 cm2
MV VTI: 1.23 cm2
S' Lateral: 4.49 cm

## 2024-08-10 ENCOUNTER — Ambulatory Visit: Payer: Self-pay | Admitting: Family

## 2024-08-10 ENCOUNTER — Encounter: Admitting: Physician Assistant

## 2024-08-14 ENCOUNTER — Encounter: Admitting: Physician Assistant

## 2024-08-14 DIAGNOSIS — E11621 Type 2 diabetes mellitus with foot ulcer: Secondary | ICD-10-CM | POA: Diagnosis not present

## 2024-08-20 ENCOUNTER — Other Ambulatory Visit: Payer: Self-pay | Admitting: Cardiology

## 2024-08-21 ENCOUNTER — Encounter: Admitting: Physician Assistant

## 2024-08-21 DIAGNOSIS — E11621 Type 2 diabetes mellitus with foot ulcer: Secondary | ICD-10-CM | POA: Diagnosis not present

## 2024-08-23 NOTE — Telephone Encounter (Signed)
 Magnesium overdue. TSH on 08/10/24 out of Normal  In accordance with refill protocols, please review and address the following requirements before this medication refill can be authorized:  Chest X-ray and Labs

## 2024-08-28 ENCOUNTER — Encounter: Admitting: Physician Assistant

## 2024-08-28 ENCOUNTER — Other Ambulatory Visit: Payer: Self-pay | Admitting: Medical Genetics

## 2024-08-29 ENCOUNTER — Encounter: Admitting: Physician Assistant

## 2024-08-29 ENCOUNTER — Other Ambulatory Visit
Admission: RE | Admit: 2024-08-29 | Discharge: 2024-08-29 | Disposition: A | Discharge status: Home / Self Care | Payer: Self-pay | Source: Ambulatory Visit | Attending: Medical Genetics | Admitting: Medical Genetics

## 2024-08-29 DIAGNOSIS — E11621 Type 2 diabetes mellitus with foot ulcer: Secondary | ICD-10-CM | POA: Diagnosis not present

## 2024-09-04 ENCOUNTER — Encounter: Admitting: Physician Assistant

## 2024-09-06 ENCOUNTER — Encounter: Admitting: Physician Assistant

## 2024-09-11 ENCOUNTER — Encounter: Admitting: Physician Assistant

## 2024-09-12 ENCOUNTER — Ambulatory Visit: Admitting: Family

## 2024-09-13 ENCOUNTER — Encounter: Admitting: Physician Assistant

## 2024-09-20 ENCOUNTER — Encounter: Admitting: Physician Assistant

## 2024-09-26 ENCOUNTER — Ambulatory Visit: Admitting: Family Medicine

## 2024-09-29 ENCOUNTER — Ambulatory Visit (INDEPENDENT_AMBULATORY_CARE_PROVIDER_SITE_OTHER): Admitting: Vascular Surgery

## 2024-09-29 ENCOUNTER — Encounter (INDEPENDENT_AMBULATORY_CARE_PROVIDER_SITE_OTHER)

## 2024-10-16 ENCOUNTER — Ambulatory Visit: Admitting: Cardiology
